# Patient Record
Sex: Male | Born: 1940
Health system: Southern US, Community
[De-identification: ages and names within clinical notes are randomized; demographics above are authoritative.]

## PROBLEM LIST (undated history)

## (undated) DIAGNOSIS — I10 Essential (primary) hypertension: Secondary | ICD-10-CM

## (undated) DIAGNOSIS — B029 Zoster without complications: Secondary | ICD-10-CM

## (undated) DIAGNOSIS — G709 Myoneural disorder, unspecified: Secondary | ICD-10-CM

## (undated) DIAGNOSIS — C61 Malignant neoplasm of prostate: Secondary | ICD-10-CM

## (undated) DIAGNOSIS — D649 Anemia, unspecified: Secondary | ICD-10-CM

## (undated) DIAGNOSIS — E785 Hyperlipidemia, unspecified: Secondary | ICD-10-CM

## (undated) DIAGNOSIS — I499 Cardiac arrhythmia, unspecified: Secondary | ICD-10-CM

## (undated) DIAGNOSIS — E119 Type 2 diabetes mellitus without complications: Secondary | ICD-10-CM

## (undated) DIAGNOSIS — R32 Unspecified urinary incontinence: Secondary | ICD-10-CM

## (undated) DIAGNOSIS — G4733 Obstructive sleep apnea (adult) (pediatric): Secondary | ICD-10-CM

## (undated) DIAGNOSIS — K219 Gastro-esophageal reflux disease without esophagitis: Secondary | ICD-10-CM

## (undated) HISTORY — DX: Anemia, unspecified: D64.9

## (undated) HISTORY — PX: EYE SURGERY: SHX253

## (undated) HISTORY — DX: Zoster without complications: B02.9

## (undated) HISTORY — PX: OTHER SURGICAL HISTORY: SHX169

## (undated) HISTORY — DX: Malignant neoplasm of prostate: C61

## (undated) HISTORY — PX: TONSILLECTOMY: SUR1361

## (undated) HISTORY — DX: Obstructive sleep apnea (adult) (pediatric): G47.33

## (undated) HISTORY — DX: Type 2 diabetes mellitus without complications: E11.9

## (undated) HISTORY — DX: Gastro-esophageal reflux disease without esophagitis: K21.9

## (undated) HISTORY — PX: PROSTATE SURGERY: SHX751

## (undated) HISTORY — DX: Hyperlipidemia, unspecified: E78.5

## (undated) HISTORY — DX: Morbid (severe) obesity due to excess calories: E66.01

## (undated) HISTORY — DX: Essential (primary) hypertension: I10

---

## 1997-12-05 ENCOUNTER — Inpatient Hospital Stay (HOSPITAL_COMMUNITY): Admission: EM | Admit: 1997-12-05 | Discharge: 1997-12-08 | Payer: Self-pay | Admitting: Emergency Medicine

## 1999-02-25 ENCOUNTER — Encounter: Admission: RE | Admit: 1999-02-25 | Discharge: 1999-03-11 | Payer: Self-pay | Admitting: General Surgery

## 1999-11-19 ENCOUNTER — Encounter: Payer: Self-pay | Admitting: Family Medicine

## 1999-11-19 ENCOUNTER — Encounter: Admission: RE | Admit: 1999-11-19 | Discharge: 1999-11-19 | Payer: Self-pay | Admitting: Family Medicine

## 2000-06-30 ENCOUNTER — Emergency Department (HOSPITAL_COMMUNITY): Admission: EM | Admit: 2000-06-30 | Discharge: 2000-06-30 | Payer: Self-pay | Admitting: *Deleted

## 2000-10-05 ENCOUNTER — Ambulatory Visit (HOSPITAL_COMMUNITY): Admission: RE | Admit: 2000-10-05 | Discharge: 2000-10-05 | Payer: Self-pay | Admitting: Gastroenterology

## 2001-08-04 ENCOUNTER — Ambulatory Visit (HOSPITAL_COMMUNITY): Admission: RE | Admit: 2001-08-04 | Discharge: 2001-08-04 | Payer: Self-pay | Admitting: Orthopedic Surgery

## 2001-08-04 ENCOUNTER — Encounter: Payer: Self-pay | Admitting: Orthopaedic Surgery

## 2002-03-23 ENCOUNTER — Ambulatory Visit (HOSPITAL_BASED_OUTPATIENT_CLINIC_OR_DEPARTMENT_OTHER): Admission: RE | Admit: 2002-03-23 | Discharge: 2002-03-23 | Payer: Self-pay | Admitting: Internal Medicine

## 2003-04-14 ENCOUNTER — Emergency Department (HOSPITAL_COMMUNITY): Admission: EM | Admit: 2003-04-14 | Discharge: 2003-04-14 | Payer: Self-pay

## 2003-04-23 ENCOUNTER — Emergency Department (HOSPITAL_COMMUNITY): Admission: EM | Admit: 2003-04-23 | Discharge: 2003-04-23 | Payer: Self-pay | Admitting: Emergency Medicine

## 2004-03-05 ENCOUNTER — Emergency Department (HOSPITAL_COMMUNITY): Admission: EM | Admit: 2004-03-05 | Discharge: 2004-03-05 | Payer: Self-pay | Admitting: Emergency Medicine

## 2006-12-11 ENCOUNTER — Emergency Department (HOSPITAL_COMMUNITY): Admission: EM | Admit: 2006-12-11 | Discharge: 2006-12-12 | Payer: Self-pay | Admitting: Emergency Medicine

## 2007-06-16 ENCOUNTER — Encounter: Admission: RE | Admit: 2007-06-16 | Discharge: 2007-06-16 | Payer: Self-pay | Admitting: Family Medicine

## 2009-02-01 ENCOUNTER — Encounter: Admission: RE | Admit: 2009-02-01 | Discharge: 2009-02-01 | Payer: Self-pay | Admitting: Family Medicine

## 2010-03-27 ENCOUNTER — Encounter: Admission: RE | Admit: 2010-03-27 | Discharge: 2010-03-27 | Payer: Self-pay | Admitting: Orthopedic Surgery

## 2010-07-16 ENCOUNTER — Encounter (HOSPITAL_BASED_OUTPATIENT_CLINIC_OR_DEPARTMENT_OTHER)
Admission: RE | Admit: 2010-07-16 | Discharge: 2010-07-16 | Disposition: A | Discharge status: Home / Self Care | Payer: Medicare Other | Source: Ambulatory Visit | Attending: Orthopedic Surgery | Admitting: Orthopedic Surgery

## 2010-07-16 ENCOUNTER — Other Ambulatory Visit: Payer: Self-pay | Admitting: Orthopedic Surgery

## 2010-07-16 ENCOUNTER — Ambulatory Visit
Admission: RE | Admit: 2010-07-16 | Discharge: 2010-07-16 | Disposition: A | Discharge status: Home / Self Care | Payer: MEDICARE | Source: Ambulatory Visit | Attending: Orthopedic Surgery | Admitting: Orthopedic Surgery

## 2010-07-16 DIAGNOSIS — Z01811 Encounter for preprocedural respiratory examination: Secondary | ICD-10-CM

## 2010-07-16 DIAGNOSIS — Z01812 Encounter for preprocedural laboratory examination: Secondary | ICD-10-CM | POA: Insufficient documentation

## 2010-07-16 DIAGNOSIS — Z01810 Encounter for preprocedural cardiovascular examination: Secondary | ICD-10-CM | POA: Insufficient documentation

## 2010-07-16 LAB — BASIC METABOLIC PANEL
BUN: 12 mg/dL (ref 6–23)
Calcium: 9.1 mg/dL (ref 8.4–10.5)
Creatinine, Ser: 0.87 mg/dL (ref 0.4–1.5)
GFR calc Af Amer: >60 mL/min (ref >60)

## 2010-07-18 ENCOUNTER — Ambulatory Visit (HOSPITAL_BASED_OUTPATIENT_CLINIC_OR_DEPARTMENT_OTHER)
Admission: RE | Admit: 2010-07-18 | Discharge: 2010-07-19 | Disposition: A | Discharge status: Home / Self Care | Payer: Medicare Other | Source: Ambulatory Visit | Attending: Orthopedic Surgery | Admitting: Orthopedic Surgery

## 2010-07-18 DIAGNOSIS — M67919 Unspecified disorder of synovium and tendon, unspecified shoulder: Secondary | ICD-10-CM | POA: Insufficient documentation

## 2010-07-18 DIAGNOSIS — Z5333 Arthroscopic surgical procedure converted to open procedure: Secondary | ICD-10-CM | POA: Insufficient documentation

## 2010-07-18 DIAGNOSIS — M719 Bursopathy, unspecified: Secondary | ICD-10-CM | POA: Insufficient documentation

## 2010-07-18 DIAGNOSIS — M19019 Primary osteoarthritis, unspecified shoulder: Secondary | ICD-10-CM | POA: Insufficient documentation

## 2010-07-18 LAB — GLUCOSE, CAPILLARY: Glucose-Capillary: 118 mg/dL — ABNORMAL HIGH (ref 70–99)

## 2010-07-18 LAB — POCT HEMOGLOBIN-HEMACUE: Hemoglobin: 12.9 g/dL — ABNORMAL LOW (ref 13.0–17.0)

## 2010-07-25 NOTE — Op Note (Signed)
Steve Jensen                ACCOUNT NO.:  [id number]  MEDICAL RECORD NO.:  [id number]           PATIENT TYPE:  LOCATION:                                 FACILITY:  PHYSICIAN:  Katy Fitch. Rosaly Labarbera, M.D.      DATE OF BIRTH:  DATE OF PROCEDURE:  07/18/2010 DATE OF DISCHARGE:                              OPERATIVE REPORT   PREOPERATIVE DIAGNOSIS:  Large degenerative rotator cuff tear right shoulder documented by preoperative MRI with significant labral pathology and significant biceps tendinopathy, also closure of subacromial outlet due to severe AC arthropathy with inferior projecting osteophytes, distal clavicle, and medial acromion.  POSTOPERATIVE DIAGNOSIS:  Large degenerative rotator cuff tear right shoulder documented by preoperative MRI with significant labral pathology and significant biceps tendinopathy, also closure of subacromial outlet due to severe AC arthropathy with inferior projecting osteophytes, distal clavicle, and medial acromion with identification of more than 50% degenerative biceps tear at watershed zone proximal to bicipital groove, also retracted tear of supraspinous infraspinatus retracted back more than 2 cm.  OPERATION: 1. Examination of right shoulder under anesthesia demonstrating     glenohumeral stability and no sign of adhesive capsulitis. 2. Arthroscopic evaluation of glenohumeral joint followed by     arthroscopic debridement of labrum, synovitis, and biceps     tendinopathy. 3. Arthroscopic subacromial decompression with bursectomy,     coracoacromial ligament partial relaxation, and anterior, medial,     and lateral acromioplasty. 4. Arthroscopic distal clavicle resection. 5. Arthroscopic biceps tenodesis. 6. Arthroscopic repair of supraspinatus and infraspinatus ultimately     aborted due to a delamination of a fiber tape suture in the     posterior aspect of the infraspinatus.  We had performed a double row repair and set our sutures  anteriorly and unfortunately noted a partial tear of one of the fiber tapes, which precluded sliding a swivel lock, therefore we immediately converted to an open repair and confirmed a satisfactory inset of the rotator cuff tendons with the previous arthroscopic technique.  OPERATING SURGEON:  Katy Fitch. La Shehan, MD  ASSISTANT:  Marveen Reeks Dasnoit, PA-C  ANESTHESIA:  General endotracheal supplemented by a right Carbocaine interscalene block.  SUPERVISING ANESTHESIOLOGIST:  Janetta Hora. Gelene Mink, MD  INDICATIONS:  Steve Jensen is a 70 year old gentleman referred by Dr. Cam Hai and Dr. Blair Heys for evaluation and management of a painful right shoulder.  Steve Jensen has multiple cold comorbidities including type 2 diabetes, hypertension, hyperlipidemia, esophageal reflux, prostate cancer treated in 1995 with PSA less than 0.1, history of shingles, and sleep apnea.  Per his wife and Steve Jensen, he will not use CPAP despite his diagnosis of sleep apnea.  He was interviewed by Dr. Gelene Mink of Anesthesia and was seen for preoperative clearance evaluation by Dr. Manus Gunning on July 15, 2010. Dr. Manus Gunning made a number of pertinent observations during the preoperative assessment including a weight of 300 pounds, BMI of 43.04, and a blood pressure setting of 150/74.  Dr. Manus Gunning felt that he was prepared to proceed with surgery; however, we will need to monitor his glucose and continue all his medication in the  perioperative period.  Steve Jensen is not on anticoagulation medicine, but will be treated with perioperative calf compression and immediate mobilization to try to minimize his risk of deep vein thrombosis and possible pulmonary embolus.  Preoperatively in the holding area, I had a detailed discussion with Mr. and [name] in which I stated that it was essential that he get up and move immediately following surgery so that we do not have a difficulty with  postoperative deep vein thrombosis and the risk of pulmonary embolus, we did explain that we would use passive compression devices.  Questions were invited and answered in detail.  Dr. Gelene Mink interviewed him preoperatively and placed a Carbocaine interscalene block without complication leading to excellent anesthesia of the right forequarter.  PROCEDURE:  Steve Jensen was brought to room one of the [facility] and placed in a supine position on the operating table.  Following induction of general anesthesia by endotracheal technique under Dr. Thornton Dales direct supervision, he was carefully positioned in a beach-chair position with a torso and head holder designed for arthroscopy.  All bony prominences were padded preoperatively and passive compression devices were placed prior to anesthesia induction. After clearance from the Anesthesia Service, we proceeded to position him for right shoulder arthroscopy and a torso and head holder designed for arthroscopy in a modified beach chair position.  The entire right upper extremity forequarter prepped with DuraPrep and draped with impervious arthroscopy drapes sealed with Steri-drape.  Ancef 1 g was administered as an IV prophylactic antibiotic.  The procedure commenced with examination of shoulder under anesthesia. He had combined elevation of 165, external rotation 90, no sign of an internal rotation contracture, no signs of adhesive capsulitis.  The scope was then reduced through a standard posterior viewing portal with blunt technique.  The glenohumeral joint was notable for excessive synovitis anteriorly and around the rotator cuff.  There was a large retracted rotator cuff tear that extended from the biceps interval posteriorly across the entire supraspinatus and infraspinatus.  This was retracted to the mid hyaline articular cartilage joint surface.  The tear was documented with digital camera.  The biceps had a more  than 50% degenerative tear and the superior and anterior labrum were quite degenerative.  A 5.5-mm suction shaver was brought in anteriorly and used to perform a thorough debridement of the synovitis, biceps tendon, and deep surface of the rotator cuff.  The hyaline articular cartilage surfaces of the glenoid humeral head were well preserved.  Attention was then directed to placing the scope in the subacromial space.  A suction shaver was introduced through posterolateral portal and extensive bursectomy accomplished.  The cuff tear was studied and seemed amenable for possible arthroscopic repair.  Given Steve Jensen's BMI of 43 there were great advantages of performing arthroscopic repair.  We proceeded to decorticate the greater tuberosity lowering his profile approximately 3 mm of the suction bur.  The coracoacromial ligament was partially relaxed and the acromion was leveled to a type 1 morphology with removal of bone spurs medially at the [facility] joint anteriorly and laterally.  The distal centimeter clavicle was removed arthroscopically. Hemostasis achieved with bipolar cautery.  We then placed posterolateral, anterolateral, and anterior portals in an attempt to begin arthroscopic repair.  We placed 2 medial swivel locks at the articular margin, anticipating a crisscross compression two-row repair.  The fiber tapes were passed with a scorpion suture passer in a standard manner.  We had 2 technical challenges, one being the degenerative biceps,  the second being a tendency towards dog ears with advancement of the cuff.  Therefore, we performed an arthroscopic tenodesis of the biceps using the central suture of the anterior corkscrew with a double pass of the scorpion.  This was tied arthroscopically with a knot pusher.  We then used a suction shaver to debride the intra-articular portion of the biceps and documented the resection with a digital camera.  We proceeded along in the  pathway of an arthroscopic repair completing placement of the medial passes of the fiber tape followed by comparing the relative anterior limbs of the fiber tape and posterior limbs of fiber tape for a crisscross type inset repair.  The anterior set was placed initially to be certain that the biceps was properly covered followed by preparation to set the posterior pair of fiber tapes.  At this point, we noted we could not slide a swivel lock through the cannula due to fray of the fiber tape.  This led to a technical problem that would preclude completing the arthroscopic repair.  Therefore, I performed an anterior middle third deltoid muscle splitting incision through the anterolateral portal and retrieved the sutures.  We confirmed that the dog ears were slightly problematic and proceeded to resect the dog ears under direct vision.  The repair was completed with a posterior swivel lock with excellent purchase and inset.  The central suture on the swivel lock placed laterally was then used to finish the posterior dog ear and the anterior dog ear was repaired with a free mattress suture of #2 FiberWire that was placed with a third lateral swivel lock.  A very satisfactory low-profile repair was achieved.  The undersurface of the acromion was palpated and found to be satisfactory.  Hemostasis of the bursa was obtained with the bipolar and a Bovie cautery.  After irrigation, the deltoid was repaired with simple suture of 0- Vicryl followed by repair of the skin with subcutaneous 2-0 and 0-Vicryl and intradermal 3-0 Prolene.  The portals were repaired with intradermal 3-0 Prolene.  There were no apparent complications other than an additional 30 minutes of surgery to complete the repair due to the technical problem with the fiber tape.  Steve Jensen was awakened from general anesthesia, placed in a sling, and transferred to the recovery room with stable signs.  Due to his multiple  comorbidities as stated previously, we will mobilize him immediately.  We will monitor his O2 sat overnight and his wife will stay due to his choice not to use a CPAP machine.  A postoperative conference was held with his wife explaining that our operative time was a bit more than expected 2 hours due to our technical issue and the fact that we are operating on a very large shoulder with the attendant technical difficulties of operating on morbidly obese individuals.  Questions were invited and answered in detail.     Katy Fitch Tyleek Smick, M.D.     RVS/MEDQ  D:  07/18/2010  T:  07/19/2010  Job:  [id number]  cc:   Bryan Lemma. Manus Gunning, M.D. Lupita Raider, M.D.  Electronically Signed by Josephine Igo M.D. on 07/25/2010 10:24:18 AM

## 2013-02-07 DIAGNOSIS — M25561 Pain in right knee: Secondary | ICD-10-CM | POA: Insufficient documentation

## 2013-03-03 ENCOUNTER — Encounter: Payer: Self-pay | Admitting: Interventional Cardiology

## 2013-03-03 DIAGNOSIS — C61 Malignant neoplasm of prostate: Secondary | ICD-10-CM | POA: Insufficient documentation

## 2013-03-03 DIAGNOSIS — R809 Proteinuria, unspecified: Secondary | ICD-10-CM | POA: Insufficient documentation

## 2013-03-03 DIAGNOSIS — J309 Allergic rhinitis, unspecified: Secondary | ICD-10-CM | POA: Insufficient documentation

## 2013-03-03 DIAGNOSIS — I10 Essential (primary) hypertension: Secondary | ICD-10-CM | POA: Insufficient documentation

## 2013-03-03 DIAGNOSIS — E1149 Type 2 diabetes mellitus with other diabetic neurological complication: Secondary | ICD-10-CM

## 2013-03-03 DIAGNOSIS — E1142 Type 2 diabetes mellitus with diabetic polyneuropathy: Secondary | ICD-10-CM | POA: Insufficient documentation

## 2013-03-03 DIAGNOSIS — M1990 Unspecified osteoarthritis, unspecified site: Secondary | ICD-10-CM | POA: Insufficient documentation

## 2013-03-03 DIAGNOSIS — G4733 Obstructive sleep apnea (adult) (pediatric): Secondary | ICD-10-CM | POA: Insufficient documentation

## 2013-03-03 DIAGNOSIS — E119 Type 2 diabetes mellitus without complications: Secondary | ICD-10-CM | POA: Insufficient documentation

## 2013-03-03 DIAGNOSIS — E782 Mixed hyperlipidemia: Secondary | ICD-10-CM | POA: Insufficient documentation

## 2013-03-08 ENCOUNTER — Ambulatory Visit (INDEPENDENT_AMBULATORY_CARE_PROVIDER_SITE_OTHER): Payer: Medicare Other | Admitting: Interventional Cardiology

## 2013-03-08 ENCOUNTER — Encounter: Payer: Self-pay | Admitting: Interventional Cardiology

## 2013-03-08 VITALS — BP 150/82 | HR 81 | Ht 73.0 in | Wt 300.0 lb

## 2013-03-08 DIAGNOSIS — G4733 Obstructive sleep apnea (adult) (pediatric): Secondary | ICD-10-CM

## 2013-03-08 DIAGNOSIS — R0602 Shortness of breath: Secondary | ICD-10-CM

## 2013-03-08 DIAGNOSIS — I10 Essential (primary) hypertension: Secondary | ICD-10-CM

## 2013-03-08 DIAGNOSIS — E782 Mixed hyperlipidemia: Secondary | ICD-10-CM

## 2013-03-08 NOTE — Progress Notes (Signed)
Patient ID: Steve Jensen, male   DOB: 22-Mar-1941, 72 y.o.   MRN: [id number]    [address] [place], [place]  [address] Phone: [phone] Fax:  [phone]  Date:  03/08/2013   ID:  DAEMIAN GAHM, DOB 10-27-1940, MRN [id number]  PCP:  Lupita Raider, MD      History of Present Illness: Steve Jensen is a 72 y.o. male  who has risk factors for coronary artery disease. He has had swelling and SHOB. His SHOB is better. His shoulder is better. He is using CPAP. He feels more rested and less fatigued. Works in the yard for exercise. No regular walking.  Limited by right knee pain. Hypertension:  c/o Leg edema more at the end of the day. Unchanged at the ankles Denies : Chest pain.  Dizziness.  Orthopnea.  Paroxysmal nocturnal dyspnea.  Palpitations.  Syncope.     Wt Readings from Last 3 Encounters:  03/08/13 300 lb (136.079 kg)     Past Medical History  Diagnosis Date  . Hypertension   . Hyperlipidemia   . Anemia   . Morbid obesity   . Diabetes mellitus type II, controlled     with neuropathy  . Prostate cancer   . GERD (gastroesophageal reflux disease)   . Shingles     on face Nov 2010  . OSA (obstructive sleep apnea)     Current Outpatient Prescriptions  Medication Sig Dispense Refill  . amLODipine (NORVASC) 10 MG tablet 10 mg daily.       Marland Kitchen aspirin 81 MG tablet Take 81 mg by mouth daily.      . fish oil-omega-3 fatty acids 1000 MG capsule Take 1,200 mg by mouth 2 (two) times daily.      . furosemide (LASIX) 40 MG tablet 40 mg 2 (two) times daily.       . metFORMIN (GLUCOPHAGE) 1000 MG tablet 1,000 mg 2 (two) times daily with a meal.       . omeprazole (PRILOSEC) 20 MG capsule 20 mg.       . ramipril (ALTACE) 10 MG capsule 10 mg 2 (two) times daily.       Marland Kitchen ZETIA 10 MG tablet Take 10 mg by mouth daily.        No current facility-administered medications for this visit.    Allergies:    Allergies  Allergen Reactions  . Lipitor  [Atorvastatin]     Muscle aches  . Simvastatin     Muscle aches  . Zithromax [Azithromycin]     GI-SIDE EFFECTS    Social History:  The patient  reports that he has quit smoking. He does not have any smokeless tobacco history on file. He reports that he does not drink alcohol or use illicit drugs.   Family History:  The patient's family history includes CVA in his brother; Diabetes in his brother, brother, and brother; Heart disease in his brother; Hypertension in his brother, brother, and brother; Prostate cancer in his brother.   ROS:  Please see the history of present illness.  No nausea, vomiting.  No fevers, chills.  No focal weakness.  No dysuria. Feels rested after sleep when he uses his CPAP.    All other systems reviewed and negative.   PHYSICAL EXAM: VS:  BP 150/82  Pulse 81  Ht 6\' 1"  (1.854 m)  Wt 300 lb (136.079 kg)  BMI 39.59 kg/m2 Well nourished, well developed, in no acute distress HEENT: normal Neck: no  JVD, no carotid bruits Cardiac:  normal S1, S2; RRR;  Lungs:  clear to auscultation bilaterally, no wheezing, rhonchi or rales Abd: soft, nontender, no hepatomegaly Ext: tr pedal edema Skin: warm and dry Neuro:   no focal abnormalities noted  EKG:  Normal     ASSESSMENT AND PLAN:  Obstructive sleep apnea  Tolerating CPAP. CONtinue to use this for sleep along with weight loss.  2. Dyspnea  Continue Lasix Tablet, 40 MG, 1 tablet, Orally, twice a day Improving. Likely related to diastolic dysfunction. He had a negative nuclear stress test in May of 2012. At that time, ejection fraction was 65%.  3. Obesity  Continue to try to lose weight. Exercise limited by knee pain.  Wants to avoid surgery on the knee.  He discussed an ad for a technique that could help lose a pound a day. I recommend a pound a week weight loss through diet control and exercise.  He has cut back on portion sizes.   4. Hypertension, essential  Continue Ramipril Capsule, 10 MG, 1 capsule,  Orally, Twice a day for BP Continue Amlodipine Besylate Tablet, 10 MG, 1 tablet, Orally, Once a day for BP Well controlled when he goes to donate blood at the [facility]. 130/78 , 130/70, 138/70   Signed, Fredric Mare, MD, [facility] 03/08/2013 2:51 PM

## 2013-03-08 NOTE — Patient Instructions (Signed)
Your physician wants you to follow-up in: 1 year with Dr. Varanasi. You will receive a reminder letter in the mail two months in advance. If you don't receive a letter, please call our office to schedule the follow-up appointment.  Your physician recommends that you continue on your current medications as directed. Please refer to the Current Medication list given to you today.  

## 2013-04-05 ENCOUNTER — Other Ambulatory Visit: Payer: Self-pay | Admitting: Interventional Cardiology

## 2013-06-24 ENCOUNTER — Other Ambulatory Visit: Payer: Self-pay | Admitting: Orthopedic Surgery

## 2013-06-24 DIAGNOSIS — M25561 Pain in right knee: Secondary | ICD-10-CM

## 2013-06-25 ENCOUNTER — Ambulatory Visit
Admission: RE | Admit: 2013-06-25 | Discharge: 2013-06-25 | Disposition: A | Discharge status: Home / Self Care | Payer: Medicare Other | Source: Ambulatory Visit | Attending: Orthopedic Surgery | Admitting: Orthopedic Surgery

## 2013-06-25 DIAGNOSIS — M25561 Pain in right knee: Secondary | ICD-10-CM

## 2013-06-27 ENCOUNTER — Encounter: Payer: Self-pay | Admitting: Cardiology

## 2013-06-27 ENCOUNTER — Ambulatory Visit (INDEPENDENT_AMBULATORY_CARE_PROVIDER_SITE_OTHER): Payer: Medicare Other | Admitting: Cardiology

## 2013-06-27 ENCOUNTER — Encounter (INDEPENDENT_AMBULATORY_CARE_PROVIDER_SITE_OTHER): Payer: Self-pay

## 2013-06-27 VITALS — BP 146/76 | HR 107 | Ht 72.0 in | Wt 302.0 lb

## 2013-06-27 DIAGNOSIS — G4733 Obstructive sleep apnea (adult) (pediatric): Secondary | ICD-10-CM

## 2013-06-27 DIAGNOSIS — I10 Essential (primary) hypertension: Secondary | ICD-10-CM

## 2013-06-27 NOTE — Patient Instructions (Signed)
Your physician recommends that you continue on your current medications as directed. Please refer to the Current Medication list given to you today.  Your physician wants you to follow-up in: 6 Months with Dr Turner You will receive a reminder letter in the mail two months in advance. If you don't receive a letter, please call our office to schedule the follow-up appointment.  

## 2013-06-27 NOTE — Progress Notes (Signed)
[address], [place], [place]  [address] Phone: [phone] Fax:  [phone]  Date:  06/27/2013   ID:  Steve Jensen, DOB 1940/05/31, MRN [id number]  PCP:  [name], MD  Cardiologist:  Dr. Casandra Doffing  Sleep Medicine:  Fransico Him, MD   History of Present Illness: Steve Jensen is a 73 y.o. male with a history of OSA on BiPAP, morbid obesity and HTN who presents today for followup.  He tolerates his BiPAP therapy well.  He tolerates the mask and feels the pressure is adequate. He does not feel rested when he gets up due to getting up so much at night due to his diuretic and takes a nap occasionally during the day.  Last week his machine started having problems with the humidifier.  He says that it is now running out of water and the temperature stays up and almost burned his nose.   Wt Readings from Last 3 Encounters:  06/27/13 302 lb (136.986 kg)  03/08/13 300 lb (136.079 kg)     Past Medical History  Diagnosis Date  . Hypertension   . Hyperlipidemia   . Anemia   . Morbid obesity   . Diabetes mellitus type II, controlled     with neuropathy  . Prostate cancer   . GERD (gastroesophageal reflux disease)   . Shingles     on face Nov 2010  . OSA (obstructive sleep apnea)     Current Outpatient Prescriptions  Medication Sig Dispense Refill  . amLODipine (NORVASC) 10 MG tablet 10 mg daily.       Marland Kitchen aspirin 81 MG tablet Take 81 mg by mouth daily.      . fish oil-omega-3 fatty acids 1000 MG capsule Take 1,200 mg by mouth 2 (two) times daily.      . furosemide (LASIX) 40 MG tablet TAKE 1 TABLET BY MOUTH TWICE DAILY  180 tablet  3  . metFORMIN (GLUCOPHAGE) 1000 MG tablet 1,000 mg 2 (two) times daily with a meal.       . omeprazole (PRILOSEC) 20 MG capsule 20 mg.       . ramipril (ALTACE) 10 MG capsule 10 mg 2 (two) times daily.       Marland Kitchen ZETIA 10 MG tablet Take 10 mg by mouth daily.        No current facility-administered medications for this visit.     Allergies:    Allergies  Allergen Reactions  . Lipitor [Atorvastatin]     Muscle aches  . Simvastatin     Muscle aches  . Zithromax [Azithromycin]     GI-SIDE EFFECTS    Social History:  The patient  reports that he has quit smoking. He does not have any smokeless tobacco history on file. He reports that he does not drink alcohol or use illicit drugs.   Family History:  The patient's family history includes CVA in his brother; Diabetes in his brother, brother, and brother; Heart disease in his brother; Hypertension in his brother, brother, and brother; Prostate cancer in his brother.   ROS:  Please see the history of present illness.      All other systems reviewed and negative.   PHYSICAL EXAM: VS:  BP 146/76  Pulse 107  Ht 6' (1.829 m)  Wt 302 lb (136.986 kg)  BMI 40.95 kg/m2  SpO2 98% Well nourished, well developed, in no acute distress HEENT: normal Neck: no JVD Cardiac:  normal S1, S2; RRR; no murmur Lungs:  clear to auscultation bilaterally, no wheezing, rhonchi or rales Abd: soft, nontender, no hepatomegaly Ext: no edema Skin: warm and dry Neuro:  CNs 2-12 intact, no focal abnormalities noted       ASSESSMENT AND PLAN:  1.  OSA on BiPAP ASV - his download today showed an AHI of 0.4/hr but only 40% compliance in using more than 4 hours nightly.  - I recommended that he try to increase his usage to at least 5 hours nightly.   - I have asked him to take his machine over to [facility] to have it looked at for the humidifier issues 2.  HTN - borderline control  - continue amlodipine/ramipril 3.  Morbid obesity  Followup with me in 6 months  Signed, Fransico Him, MD 06/27/2013 3:30 PM

## 2013-07-11 DIAGNOSIS — Z9889 Other specified postprocedural states: Secondary | ICD-10-CM | POA: Insufficient documentation

## 2013-08-10 ENCOUNTER — Ambulatory Visit
Admission: RE | Admit: 2013-08-10 | Discharge: 2013-08-10 | Disposition: A | Discharge status: Home / Self Care | Payer: Medicare Other | Source: Ambulatory Visit | Attending: Physician Assistant | Admitting: Physician Assistant

## 2013-08-10 ENCOUNTER — Other Ambulatory Visit: Payer: Self-pay | Admitting: Physician Assistant

## 2013-08-10 DIAGNOSIS — R059 Cough, unspecified: Secondary | ICD-10-CM

## 2013-08-10 DIAGNOSIS — R0602 Shortness of breath: Secondary | ICD-10-CM

## 2013-08-10 DIAGNOSIS — R05 Cough: Secondary | ICD-10-CM

## 2013-08-11 ENCOUNTER — Ambulatory Visit (INDEPENDENT_AMBULATORY_CARE_PROVIDER_SITE_OTHER): Payer: Medicare Other | Admitting: Interventional Cardiology

## 2013-08-11 ENCOUNTER — Encounter: Payer: Self-pay | Admitting: Interventional Cardiology

## 2013-08-11 VITALS — BP 118/60 | HR 120 | Ht 72.0 in | Wt 294.0 lb

## 2013-08-11 DIAGNOSIS — R0602 Shortness of breath: Secondary | ICD-10-CM

## 2013-08-11 DIAGNOSIS — I10 Essential (primary) hypertension: Secondary | ICD-10-CM

## 2013-08-11 DIAGNOSIS — I4891 Unspecified atrial fibrillation: Secondary | ICD-10-CM

## 2013-08-11 DIAGNOSIS — I480 Paroxysmal atrial fibrillation: Secondary | ICD-10-CM | POA: Insufficient documentation

## 2013-08-11 LAB — BASIC METABOLIC PANEL
BUN: 13 mg/dL (ref 6–23)
CALCIUM: 8.6 mg/dL (ref 8.4–10.5)
CHLORIDE: 102 meq/L (ref 96–112)
CO2: 25 meq/L (ref 19–32)
Creatinine, Ser: 1.1 mg/dL (ref 0.4–1.5)
GFR: 70.79 mL/min (ref >60.00)
Glucose, Bld: 202 mg/dL — ABNORMAL HIGH (ref 70–99)
Potassium: 3.8 mEq/L (ref 3.5–5.1)
SODIUM: 139 meq/L (ref 135–145)

## 2013-08-11 LAB — TSH: TSH: 2.83 u[IU]/mL (ref 0.35–5.50)

## 2013-08-11 MED ORDER — DILTIAZEM HCL ER COATED BEADS 240 MG PO CP24: 240.0000 mg | ORAL_CAPSULE | Freq: Every day | ORAL | Status: DC

## 2013-08-11 MED ORDER — APIXABAN 5 MG PO TABS: 5.0000 mg | ORAL_TABLET | Freq: Two times a day (BID) | ORAL | Status: DC

## 2013-08-11 NOTE — Patient Instructions (Signed)
Your physician has recommended you make the following change in your medication:   1. Stop Aspirin.  2. Stop Amlodipine.  3. Start Eliquis 5 mg 1 tab twice a day.  4. Start Cardizem CD 240 mg 1 tablet daily.  Your physician recommends that you return for lab work today for TSH and BMET.  Your physician recommends that you schedule a follow-up appointment in: 1 month with Dr. Irish Lack. (ok to double book)  Your physician has requested that you have an echocardiogram. Echocardiography is a painless test that uses sound waves to create images of your heart. It provides your doctor with information about the size and shape of your heart and how well your heart's chambers and valves are working. This procedure takes approximately one hour. There are no restrictions for this procedure.  Your physician recommends that you return for lab work in: 6 months on 02/13/14 for Bmet and CBC.

## 2013-08-11 NOTE — Progress Notes (Signed)
Patient ID: Steve Jensen, male   DOB: 07/25/1940, 73 y.o.   MRN: [id number]    [place], [address] [place], [place]  [address] Phone: [phone] Fax:  [phone]  Date:  08/11/2013   ID:  Steve Jensen, DOB 08/11/1940, MRN [id number]  PCP:  [name], MD      History of Present Illness: Steve Jensen is a 73 y.o. male who has had swelling and SHOB. He was diagnosed with bronchitis and pneumonia a week ago.  Marland Kitchen He is using CPAP until the past week. He feels more fatigued. Works in the yard for exercise. No regular walking.  No swelling.  He had knee surgery in 2/15 and was not told he was in AFib at that time.  Hypertension:  c/o Leg edema more at the end of the day.  Denies : Chest pain.  Dizziness.  Orthopnea.  Paroxysmal nocturnal dyspnea.  Palpitations.  Syncope.     Wt Readings from Last 3 Encounters:  08/11/13 294 lb (133.358 kg)  06/27/13 302 lb (136.986 kg)  03/08/13 300 lb (136.079 kg)     Past Medical History  Diagnosis Date  . Hypertension   . Hyperlipidemia   . Anemia   . Morbid obesity   . Diabetes mellitus type II, controlled     with neuropathy  . Prostate cancer   . GERD (gastroesophageal reflux disease)   . Shingles     on face Nov 2010  . OSA (obstructive sleep apnea)     Current Outpatient Prescriptions  Medication Sig Dispense Refill  . amLODipine (NORVASC) 10 MG tablet 10 mg daily.       Marland Kitchen aspirin 81 MG tablet Take 81 mg by mouth daily.      . fish oil-omega-3 fatty acids 1000 MG capsule Take 1,200 mg by mouth 2 (two) times daily.      . furosemide (LASIX) 40 MG tablet TAKE 1 TABLET BY MOUTH TWICE DAILY  180 tablet  3  . HYDROcodone-acetaminophen (NORCO/VICODIN) 5-325 MG per tablet       . ibuprofen (ADVIL,MOTRIN) 800 MG tablet       . metFORMIN (GLUCOPHAGE) 1000 MG tablet 1,000 mg 2 (two) times daily with a meal.       . metoprolol succinate (TOPROL-XL) 50 MG 24 hr tablet       . omeprazole (PRILOSEC) 20 MG capsule 20 mg.        . ramipril (ALTACE) 10 MG capsule 10 mg 2 (two) times daily.       Marland Kitchen ZETIA 10 MG tablet Take 10 mg by mouth daily.        No current facility-administered medications for this visit.    Allergies:    Allergies  Allergen Reactions  . Lipitor [Atorvastatin]     Muscle aches  . Simvastatin     Muscle aches  . Zithromax [Azithromycin]     GI-SIDE EFFECTS    Social History:  The patient  reports that he has quit smoking. He does not have any smokeless tobacco history on file. He reports that he does not drink alcohol or use illicit drugs.   Family History:  The patient's family history includes CVA in his brother; Diabetes in his brother, brother, and brother; Heart disease in his brother; Hypertension in his brother, brother, and brother; Prostate cancer in his brother.   ROS:  Please see the history of present illness.  No nausea, vomiting.  No fevers, chills.  No  focal weakness.  No dysuria.    All other systems reviewed and negative.   PHYSICAL EXAM: VS:  BP 118/60  Pulse 60  Ht 6' (1.829 m)  Wt 294 lb (133.358 kg)  BMI 39.86 kg/m2 Well nourished, well developed, in no acute distress HEENT: normal Neck: no JVD, no carotid bruits Cardiac:  normal S1, S2; irregulary Lungs:  clear to auscultation bilaterally, no wheezing, rhonchi or rales Abd: soft, nontender, no hepatomegaly Ext: no edema Skin: warm and dry Neuro:   no focal abnormalities noted  EKG:  AFib with RVR     ASSESSMENT AND PLAN:  1. AFib: New onset.  ECHO in 4/12: Normal LV function at that time.  Mild AI, mildly dilated aortic root.  Repeat echo.  Discussed anticoagulation.  He is ok to start a NOAC.  Switch amlodipine to Cardizem 240 mg daily.  Continue metoprolol. Check electrolytes. We'll plan to start Eliquis 5 mg twice a day.  Chads score is 3.  Check TSH. May have to decrease fish oil with Eliquis.  Stop aspirin.  Consider cardioversion in the future. 2. SHOB: Recent bronchitis. He has completed a  course of antibiotics. I wonder if part of this is coming from diastolic dysfunction due to the new onset of atrial fibrillation.  Continue twice a day furosemide. Unclear when his atrial fibrillation actually started. It is likely after February, since he had knee surgery at that time and there is no mention made. 3. Obesity: Stressed the importance of weight loss to help prevent atrial fibrillation. 4. OSA: Wearing CPAP as tolerated.  Signed, Mina Marble, MD, [facility] 08/11/2013 8:31 AM

## 2013-08-15 ENCOUNTER — Telehealth: Payer: Self-pay | Admitting: *Deleted

## 2013-08-15 ENCOUNTER — Telehealth: Payer: Self-pay | Admitting: Interventional Cardiology

## 2013-08-15 NOTE — Telephone Encounter (Signed)
Returned pt's call.

## 2013-08-15 NOTE — Telephone Encounter (Signed)
PA to [employer] [facility] for patients eliquis

## 2013-08-15 NOTE — Telephone Encounter (Signed)
New message    Patient calling stating someone called him today told him to call back.

## 2013-08-15 NOTE — Telephone Encounter (Signed)
Follow up ° ° ° ° °Returning Steve Jensen's call °

## 2013-08-18 ENCOUNTER — Ambulatory Visit (HOSPITAL_COMMUNITY): Payer: Medicare Other | Attending: Interventional Cardiology | Admitting: Radiology

## 2013-08-18 DIAGNOSIS — I4891 Unspecified atrial fibrillation: Secondary | ICD-10-CM | POA: Insufficient documentation

## 2013-08-18 NOTE — Progress Notes (Signed)
Echocardiogram performed.  

## 2013-08-22 ENCOUNTER — Telehealth: Payer: Self-pay | Admitting: Interventional Cardiology

## 2013-08-22 NOTE — Telephone Encounter (Signed)
Returned pt's call.

## 2013-08-22 NOTE — Telephone Encounter (Signed)
PA to [employer] [facility] for eliquis approved through 08/15/2014, [place] # [id number]

## 2013-08-22 NOTE — Telephone Encounter (Signed)
New message     Patient calling stated someone called him last week regarding his echo results

## 2013-08-31 ENCOUNTER — Ambulatory Visit (INDEPENDENT_AMBULATORY_CARE_PROVIDER_SITE_OTHER): Payer: Medicare Other | Admitting: Interventional Cardiology

## 2013-08-31 ENCOUNTER — Encounter: Payer: Self-pay | Admitting: Cardiology

## 2013-08-31 ENCOUNTER — Encounter: Payer: Self-pay | Admitting: Interventional Cardiology

## 2013-08-31 VITALS — BP 126/78 | HR 78 | Ht 71.0 in | Wt 289.8 lb

## 2013-08-31 DIAGNOSIS — I4891 Unspecified atrial fibrillation: Secondary | ICD-10-CM

## 2013-08-31 DIAGNOSIS — I10 Essential (primary) hypertension: Secondary | ICD-10-CM

## 2013-08-31 DIAGNOSIS — G4733 Obstructive sleep apnea (adult) (pediatric): Secondary | ICD-10-CM

## 2013-08-31 LAB — PROTIME-INR
INR: 1.3 ratio — AB (ref 0.8–1.0)
Prothrombin Time: 13.7 s — ABNORMAL HIGH (ref 10.2–12.4)

## 2013-08-31 LAB — BASIC METABOLIC PANEL
BUN: 16 mg/dL (ref 6–23)
CALCIUM: 9 mg/dL (ref 8.4–10.5)
CO2: 28 mEq/L (ref 19–32)
Chloride: 101 mEq/L (ref 96–112)
Creatinine, Ser: 1 mg/dL (ref 0.4–1.5)
GFR: 75.56 mL/min (ref >60.00)
Glucose, Bld: 146 mg/dL — ABNORMAL HIGH (ref 70–99)
POTASSIUM: 3.3 meq/L — AB (ref 3.5–5.1)
Sodium: 140 mEq/L (ref 135–145)

## 2013-08-31 LAB — CBC WITH DIFFERENTIAL/PLATELET
BASOS PCT: 0.5 % (ref 0.0–3.0)
Basophils Absolute: 0.1 10*3/uL (ref 0.0–0.1)
EOS PCT: 2 % (ref 0.0–5.0)
Eosinophils Absolute: 0.2 10*3/uL (ref 0.0–0.7)
HCT: 39.5 % (ref 39.0–52.0)
Hemoglobin: 12.8 g/dL — ABNORMAL LOW (ref 13.0–17.0)
LYMPHS PCT: 31 % (ref 12.0–46.0)
Lymphs Abs: 3 10*3/uL (ref 0.7–4.0)
MCHC: 32.4 g/dL (ref 30.0–36.0)
MCV: 74.6 fl — AB (ref 78.0–100.0)
MONO ABS: 1 10*3/uL (ref 0.1–1.0)
Monocytes Relative: 10 % (ref 3.0–12.0)
NEUTROS PCT: 56.5 % (ref 43.0–77.0)
Neutro Abs: 5.5 10*3/uL (ref 1.4–7.7)
Platelets: 309 10*3/uL (ref 150.0–400.0)
RBC: 5.3 Mil/uL (ref 4.22–5.81)
RDW: 17.7 % — ABNORMAL HIGH (ref 11.5–14.6)
WBC: 9.7 10*3/uL (ref 4.5–10.5)

## 2013-08-31 NOTE — Progress Notes (Signed)
Patient ID: Steve Jensen, male   DOB: 1940/08/23, 73 y.o.   MRN: [id number] Patient ID: Steve Jensen, male   DOB: 1941/03/13, 73 y.o.   MRN: [id number]    [place], [place] [place], [place]  [address] Phone: [phone] Fax:  [phone]  Date:  08/31/2013   ID:  Steve Jensen, DOB Jan 12, 1941, MRN [id number]  PCP:  [name], MD      History of Present Illness: Steve Jensen is a 73 y.o. male who has had swelling and SHOB. He was diagnosed with bronchitis and pneumonia a week ago.  Marland Kitchen He is using CPAP until the past week. He feels more fatigued. Works in the yard for exercise. No regular walking.  No swelling.  He had knee surgery in 2/15 and was not told he was in AFib at that time.  Hypertension:  c/o Leg edema more at the end of the day- same as usual.  Denies : Chest pain.  Dizziness.  Orthopnea.  Paroxysmal nocturnal dyspnea.  Palpitations.  Syncope.   One espisode of SHOB in the morning at his usual time, where it felt bettter sitting up.  No further episode.  No excessive salt intake.   Wt Readings from Last 3 Encounters:  08/31/13 289 lb 12.8 oz (131.452 kg)  08/11/13 294 lb (133.358 kg)  06/27/13 302 lb (136.986 kg)     Past Medical History  Diagnosis Date  . Hypertension   . Hyperlipidemia   . Anemia   . Morbid obesity   . Diabetes mellitus type II, controlled     with neuropathy  . Prostate cancer   . GERD (gastroesophageal reflux disease)   . Shingles     on face Nov 2010  . OSA (obstructive sleep apnea)     Current Outpatient Prescriptions  Medication Sig Dispense Refill  . amLODipine (NORVASC) 10 MG tablet       . apixaban (ELIQUIS) 5 MG TABS tablet Take 1 tablet (5 mg total) by mouth 2 (two) times daily.  60 tablet  6  . diltiazem (CARTIA XT) 240 MG 24 hr capsule Take 1 capsule (240 mg total) by mouth daily.  30 capsule  6  . fish oil-omega-3 fatty acids 1000 MG capsule Take 1,200 mg by mouth 2 (two) times daily.      . furosemide  (LASIX) 40 MG tablet TAKE 1 TABLET BY MOUTH TWICE DAILY  180 tablet  3  . HYDROcodone-acetaminophen (NORCO/VICODIN) 5-325 MG per tablet       . ibuprofen (ADVIL,MOTRIN) 800 MG tablet       . metFORMIN (GLUCOPHAGE) 1000 MG tablet 1,000 mg 2 (two) times daily with a meal.       . metoprolol succinate (TOPROL-XL) 50 MG 24 hr tablet       . omeprazole (PRILOSEC) 20 MG capsule 20 mg.       . ramipril (ALTACE) 10 MG capsule 10 mg 2 (two) times daily.       Marland Kitchen ZETIA 10 MG tablet Take 10 mg by mouth daily.        No current facility-administered medications for this visit.    Allergies:    Allergies  Allergen Reactions  . Lipitor [Atorvastatin]     Muscle aches  . Simvastatin     Muscle aches  . Zithromax [Azithromycin]     GI-SIDE EFFECTS    Social History:  The patient  reports that he has quit smoking. He does not have  any smokeless tobacco history on file. He reports that he does not drink alcohol or use illicit drugs.   Family History:  The patient's family history includes CVA in his brother; Diabetes in his brother, brother, and brother; Heart disease in his brother; Hypertension in his brother, brother, and brother; Prostate cancer in his brother.   ROS:  Please see the history of present illness.  No nausea, vomiting.  No fevers, chills.  No focal weakness.  No dysuria.    All other systems reviewed and negative.   PHYSICAL EXAM: VS:  BP 126/78  Pulse 78  Ht 5\' 11"  (1.803 m)  Wt 289 lb 12.8 oz (131.452 kg)  BMI 40.44 kg/m2 Well nourished, well developed, in no acute distress HEENT: normal Neck: no JVD, no carotid bruits Cardiac:  normal S1, S2; irregularyirregular, normal rate Lungs:  clear to auscultation bilaterally, no wheezing, rhonchi or rales Abd: soft, nontender, no hepatomegaly Ext: no edema Skin: warm and dry Neuro:   no focal abnormalities noted  EKG:  AFib with RVR     ASSESSMENT AND PLAN:  1. AFib: New onset.  ECHO in 4/12: Normal LV function at that time.   Mild AI, mildly dilated aortic root.  Repeat echo.  Discussed anticoagulation.  Started a NOAC.  Switched amlodipine to Cardizem 240 mg daily.  Continue metoprolol. Controlled electrolytes in 2/15. Started Eliquis 5 mg twice a day.  Chads score is 3.  Check TSH. May have to decrease fish oil with Eliquis.  Stop aspirin.  Plan for cardioversion next week since he has been on his anticoagulation. He has not felt like his usual self for several months. This may be related to his atrial fibrillation and loss of atrial kick. The procedure was explained to the patient. All questions were answered. 2. SHOB:  I wonder if part of this is coming from diastolic dysfunction due to the atrial fibrillation.  Continue twice a day furosemide. Unclear when his atrial fibrillation actually started. It is likely after February, since he had knee surgery at that time and there is no mention made. 3. Obesity: Stressed the importance of weight loss to help prevent atrial fibrillation.  He has lost over 10 lbs in the psat 2 months.  I encouraged him to continue the lifestyle modifications that would lead to more weight loss. 4. OSA: Wearing CPAP as tolerated.  Signed, Mina Marble, MD, [facility] 08/31/2013 10:26 AM

## 2013-08-31 NOTE — Patient Instructions (Signed)
Your physician recommends that you return for lab work today for cbc with diff, bmet, and pt/inr.  Your physician has recommended that you have a Cardioversion (DCCV). Electrical Cardioversion uses a jolt of electricity to your heart either through paddles or wired patches attached to your chest. This is a controlled, usually prescheduled, procedure. Defibrillation is done under light anesthesia in the hospital, and you usually go home the day of the procedure. This is done to get your heart back into a normal rhythm. You are not awake for the procedure. Please see the instruction sheet given to you today.

## 2013-09-01 ENCOUNTER — Telehealth: Payer: Self-pay | Admitting: Interventional Cardiology

## 2013-09-01 DIAGNOSIS — E876 Hypokalemia: Secondary | ICD-10-CM

## 2013-09-01 MED ORDER — POTASSIUM CHLORIDE CRYS ER 20 MEQ PO TBCR: EXTENDED_RELEASE_TABLET | ORAL | Status: DC

## 2013-09-01 NOTE — Telephone Encounter (Signed)
Called Pt's wife, and explained need for K supplementation. Wife states Pt will not be home until late tonight, therefore he cannot start taking med until tomorrow a.m. Explained to wife that Pt needs to come in on 4/13 to recheck BMET. Wife in understanding. Confirmed Pharmacy with wife.

## 2013-09-01 NOTE — Telephone Encounter (Signed)
Patient is returning your call. He will be out of the house remainder of the day. You can leave a detailed message with patients wife.

## 2013-09-05 ENCOUNTER — Other Ambulatory Visit: Payer: Self-pay | Admitting: Interventional Cardiology

## 2013-09-05 ENCOUNTER — Other Ambulatory Visit (INDEPENDENT_AMBULATORY_CARE_PROVIDER_SITE_OTHER): Payer: Medicare Other

## 2013-09-05 ENCOUNTER — Encounter: Payer: Self-pay | Admitting: General Surgery

## 2013-09-05 DIAGNOSIS — E876 Hypokalemia: Secondary | ICD-10-CM

## 2013-09-05 LAB — BASIC METABOLIC PANEL
BUN: 15 mg/dL (ref 6–23)
CALCIUM: 9.1 mg/dL (ref 8.4–10.5)
CO2: 28 meq/L (ref 19–32)
CREATININE: 1 mg/dL (ref 0.4–1.5)
Chloride: 103 mEq/L (ref 96–112)
GFR: 75.56 mL/min (ref >60.00)
Glucose, Bld: 207 mg/dL — ABNORMAL HIGH (ref 70–99)
Potassium: 3.9 mEq/L (ref 3.5–5.1)
SODIUM: 141 meq/L (ref 135–145)

## 2013-09-06 ENCOUNTER — Ambulatory Visit (HOSPITAL_COMMUNITY)
Admission: RE | Admit: 2013-09-06 | Discharge: 2013-09-06 | Disposition: A | Discharge status: Home / Self Care | Payer: Medicare Other | Source: Ambulatory Visit | Attending: Interventional Cardiology | Admitting: Interventional Cardiology

## 2013-09-06 ENCOUNTER — Encounter (HOSPITAL_COMMUNITY)
Admission: RE | Disposition: A | Discharge status: Home / Self Care | Payer: Self-pay | Source: Ambulatory Visit | Attending: Interventional Cardiology

## 2013-09-06 ENCOUNTER — Ambulatory Visit (HOSPITAL_COMMUNITY): Payer: Medicare Other | Admitting: Certified Registered"

## 2013-09-06 ENCOUNTER — Encounter (HOSPITAL_COMMUNITY): Payer: Medicare Other | Admitting: Certified Registered"

## 2013-09-06 ENCOUNTER — Encounter (HOSPITAL_COMMUNITY): Payer: Self-pay | Admitting: Certified Registered"

## 2013-09-06 DIAGNOSIS — E785 Hyperlipidemia, unspecified: Secondary | ICD-10-CM | POA: Insufficient documentation

## 2013-09-06 DIAGNOSIS — Z87891 Personal history of nicotine dependence: Secondary | ICD-10-CM | POA: Insufficient documentation

## 2013-09-06 DIAGNOSIS — J4489 Other specified chronic obstructive pulmonary disease: Secondary | ICD-10-CM | POA: Insufficient documentation

## 2013-09-06 DIAGNOSIS — I10 Essential (primary) hypertension: Secondary | ICD-10-CM | POA: Insufficient documentation

## 2013-09-06 DIAGNOSIS — E1142 Type 2 diabetes mellitus with diabetic polyneuropathy: Secondary | ICD-10-CM | POA: Insufficient documentation

## 2013-09-06 DIAGNOSIS — I4891 Unspecified atrial fibrillation: Secondary | ICD-10-CM | POA: Insufficient documentation

## 2013-09-06 DIAGNOSIS — D649 Anemia, unspecified: Secondary | ICD-10-CM | POA: Insufficient documentation

## 2013-09-06 DIAGNOSIS — G4733 Obstructive sleep apnea (adult) (pediatric): Secondary | ICD-10-CM | POA: Insufficient documentation

## 2013-09-06 DIAGNOSIS — Z6841 Body Mass Index (BMI) 40.0 and over, adult: Secondary | ICD-10-CM | POA: Insufficient documentation

## 2013-09-06 DIAGNOSIS — Z7901 Long term (current) use of anticoagulants: Secondary | ICD-10-CM | POA: Insufficient documentation

## 2013-09-06 DIAGNOSIS — Z8546 Personal history of malignant neoplasm of prostate: Secondary | ICD-10-CM | POA: Insufficient documentation

## 2013-09-06 DIAGNOSIS — K219 Gastro-esophageal reflux disease without esophagitis: Secondary | ICD-10-CM | POA: Insufficient documentation

## 2013-09-06 DIAGNOSIS — J449 Chronic obstructive pulmonary disease, unspecified: Secondary | ICD-10-CM | POA: Insufficient documentation

## 2013-09-06 DIAGNOSIS — E1149 Type 2 diabetes mellitus with other diabetic neurological complication: Secondary | ICD-10-CM | POA: Insufficient documentation

## 2013-09-06 HISTORY — PX: CARDIOVERSION: SHX1299

## 2013-09-06 LAB — GLUCOSE, CAPILLARY: Glucose-Capillary: 151 mg/dL — ABNORMAL HIGH (ref 70–99)

## 2013-09-06 SURGERY — CARDIOVERSION
Anesthesia: General

## 2013-09-06 MED ORDER — PROPOFOL 10 MG/ML IV BOLUS
INTRAVENOUS | Status: AC
  Filled 2013-09-06: qty 20

## 2013-09-06 MED ORDER — SUCCINYLCHOLINE CHLORIDE 20 MG/ML IJ SOLN
INTRAMUSCULAR | Status: AC
  Filled 2013-09-06: qty 1

## 2013-09-06 MED ORDER — LIDOCAINE HCL (CARDIAC) 20 MG/ML IV SOLN
INTRAVENOUS | Status: AC
  Filled 2013-09-06: qty 5

## 2013-09-06 MED ORDER — LIDOCAINE HCL (CARDIAC) 20 MG/ML IV SOLN
INTRAVENOUS | Status: DC | PRN
  Administered 2013-09-06: 50 mg via INTRAVENOUS

## 2013-09-06 MED ORDER — SODIUM CHLORIDE 0.9 % IV SOLN
INTRAVENOUS | Status: DC
  Administered 2013-09-06: 500 mL via INTRAVENOUS

## 2013-09-06 MED ORDER — PROPOFOL 10 MG/ML IV BOLUS
INTRAVENOUS | Status: DC | PRN
  Administered 2013-09-06: 70 mg via INTRAVENOUS

## 2013-09-06 NOTE — Discharge Instructions (Signed)
Can use hydrocortisone creme OTC if redness occurs on the cardioversion site.

## 2013-09-06 NOTE — H&P (View-Only) (Signed)
Patient ID: LOGON UTTECH, male   DOB: [birth year], 73 y.o.   MRN: [id number] Patient ID: COSTANTINO KOHLBECK, male   DOB: Jan 06, 1941, 73 y.o.   MRN: [id number]    [place], [address], [place]  [address] Phone: [phone] Fax:  [phone]  Date:  08/31/2013   ID:  JARRICK FJELD, DOB [birth year], MRN [id number]  PCP:  [name], MD      History of Present Illness: Steve Jensen is a 73 y.o. male who has had swelling and SHOB. He was diagnosed with bronchitis and pneumonia a week ago.  Marland Kitchen He is using CPAP until the past week. He feels more fatigued. Works in the yard for exercise. No regular walking.  No swelling.  He had knee surgery in 2/15 and was not told he was in AFib at that time.  Hypertension:  c/o Leg edema more at the end of the day- same as usual.  Denies : Chest pain.  Dizziness.  Orthopnea.  Paroxysmal nocturnal dyspnea.  Palpitations.  Syncope.   One espisode of SHOB in the morning at his usual time, where it felt bettter sitting up.  No further episode.  No excessive salt intake.   Wt Readings from Last 3 Encounters:  08/31/13 289 lb 12.8 oz (131.452 kg)  08/11/13 294 lb (133.358 kg)  06/27/13 302 lb (136.986 kg)     Past Medical History  Diagnosis Date  . Hypertension   . Hyperlipidemia   . Anemia   . Morbid obesity   . Diabetes mellitus type II, controlled     with neuropathy  . Prostate cancer   . GERD (gastroesophageal reflux disease)   . Shingles     on face Nov 2010  . OSA (obstructive sleep apnea)     Current Outpatient Prescriptions  Medication Sig Dispense Refill  . amLODipine (NORVASC) 10 MG tablet       . apixaban (ELIQUIS) 5 MG TABS tablet Take 1 tablet (5 mg total) by mouth 2 (two) times daily.  60 tablet  6  . diltiazem (CARTIA XT) 240 MG 24 hr capsule Take 1 capsule (240 mg total) by mouth daily.  30 capsule  6  . fish oil-omega-3 fatty acids 1000 MG capsule Take 1,200 mg by mouth 2 (two) times daily.      . furosemide  (LASIX) 40 MG tablet TAKE 1 TABLET BY MOUTH TWICE DAILY  180 tablet  3  . HYDROcodone-acetaminophen (NORCO/VICODIN) 5-325 MG per tablet       . ibuprofen (ADVIL,MOTRIN) 800 MG tablet       . metFORMIN (GLUCOPHAGE) 1000 MG tablet 1,000 mg 2 (two) times daily with a meal.       . metoprolol succinate (TOPROL-XL) 50 MG 24 hr tablet       . omeprazole (PRILOSEC) 20 MG capsule 20 mg.       . ramipril (ALTACE) 10 MG capsule 10 mg 2 (two) times daily.       Marland Kitchen ZETIA 10 MG tablet Take 10 mg by mouth daily.        No current facility-administered medications for this visit.    Allergies:    Allergies  Allergen Reactions  . Lipitor [Atorvastatin]     Muscle aches  . Simvastatin     Muscle aches  . Zithromax [Azithromycin]     GI-SIDE EFFECTS    Social History:  The patient  reports that he has quit smoking. He does not have  any smokeless tobacco history on file. He reports that he does not drink alcohol or use illicit drugs.   Family History:  The patient's family history includes CVA in his brother; Diabetes in his brother, brother, and brother; Heart disease in his brother; Hypertension in his brother, brother, and brother; Prostate cancer in his brother.   ROS:  Please see the history of present illness.  No nausea, vomiting.  No fevers, chills.  No focal weakness.  No dysuria.    All other systems reviewed and negative.   PHYSICAL EXAM: VS:  BP 126/78  Pulse 78  Ht 5\' 11"  (1.803 m)  Wt 289 lb 12.8 oz (131.452 kg)  BMI 40.44 kg/m2 Well nourished, well developed, in no acute distress HEENT: normal Neck: no JVD, no carotid bruits Cardiac:  normal S1, S2; irregularyirregular, normal rate Lungs:  clear to auscultation bilaterally, no wheezing, rhonchi or rales Abd: soft, nontender, no hepatomegaly Ext: no edema Skin: warm and dry Neuro:   no focal abnormalities noted  EKG:  AFib with RVR     ASSESSMENT AND PLAN:  1. AFib: New onset.  ECHO in 4/12: Normal LV function at that time.   Mild AI, mildly dilated aortic root.  Repeat echo.  Discussed anticoagulation.  Started a NOAC.  Switched amlodipine to Cardizem 240 mg daily.  Continue metoprolol. Controlled electrolytes in 2/15. Started Eliquis 5 mg twice a day.  Chads score is 3.  Check TSH. May have to decrease fish oil with Eliquis.  Stop aspirin.  Plan for cardioversion next week since he has been on his anticoagulation. He has not felt like his usual self for several months. This may be related to his atrial fibrillation and loss of atrial kick. The procedure was explained to the patient. All questions were answered. 2. SHOB:  I wonder if part of this is coming from diastolic dysfunction due to the atrial fibrillation.  Continue twice a day furosemide. Unclear when his atrial fibrillation actually started. It is likely after February, since he had knee surgery at that time and there is no mention made. 3. Obesity: Stressed the importance of weight loss to help prevent atrial fibrillation.  He has lost over 10 lbs in the psat 2 months.  I encouraged him to continue the lifestyle modifications that would lead to more weight loss. 4. OSA: Wearing CPAP as tolerated.  Signed, Mina Marble, MD, [facility] 08/31/2013 10:26 AM

## 2013-09-06 NOTE — Interval H&P Note (Signed)
History and Physical Interval Note:  09/06/2013 9:22 AM  Steve Jensen  has presented today for surgery, with the diagnosis of A FIB  The various methods of treatment have been discussed with the patient and family. After consideration of risks, benefits and other options for treatment, the patient has consented to  Procedure(s): CARDIOVERSION (N/A) as a surgical intervention .  The patient's history has been reviewed, patient examined, no change in status, stable for surgery.  I have reviewed the patient's chart and labs.  Questions were answered to the patient's satisfaction.     [name]

## 2013-09-06 NOTE — Anesthesia Postprocedure Evaluation (Signed)
  Anesthesia Post-op Note  Patient: Steve Jensen  Procedure(s) Performed: Procedure(s): CARDIOVERSION (N/A)  Patient Location: Endoscopy Unit  Anesthesia Type:General  Level of Consciousness: sedated  Airway and Oxygen Therapy: Patient Spontanous Breathing and Patient connected to nasal cannula oxygen  Post-op Pain: none  Post-op Assessment: Post-op Vital signs reviewed, Patient's Cardiovascular Status Stable, Respiratory Function Stable, Patent Airway and No signs of Nausea or vomiting  Post-op Vital Signs: Reviewed and stable  Last Vitals:  Filed Vitals:   09/06/13 0929  BP: 109/65  Pulse: 67  Temp:   Resp: 25    Complications: No apparent anesthesia complications

## 2013-09-06 NOTE — Addendum Note (Signed)
Addendum created 09/06/13 0944 by Kate Sable, MD   Modules edited: Anesthesia Attestations, Anesthesia Events

## 2013-09-06 NOTE — Transfer of Care (Signed)
Immediate Anesthesia Transfer of Care Note  Patient: Steve Jensen  Procedure(s) Performed: Procedure(s): CARDIOVERSION (N/A)  Patient Location: Endoscopy Unit  Anesthesia Type:General  Level of Consciousness: sedated  Airway & Oxygen Therapy: Patient Spontanous Breathing and Patient connected to nasal cannula oxygen  Post-op Assessment: Report given to PACU RN, Post -op Vital signs reviewed and stable and Patient moving all extremities  Post vital signs: Reviewed and stable  Complications: No apparent anesthesia complications

## 2013-09-06 NOTE — Anesthesia Preprocedure Evaluation (Addendum)
Anesthesia Evaluation  Patient identified by MRN, date of birth, ID band Patient awake    Reviewed: Allergy & Precautions, H&P , NPO status , Patient's Chart, lab work & pertinent test results  Airway Mallampati: II TM Distance: >3 FB Neck ROM: Full    Dental  (+) Teeth Intact, Dental Advisory Given   Pulmonary sleep apnea , COPDformer smoker,          Cardiovascular hypertension, + dysrhythmias Atrial Fibrillation     Neuro/Psych    GI/Hepatic GERD-  ,  Endo/Other  diabetes, Type 2, Oral Hypoglycemic AgentsMorbid obesity  Renal/GU      Musculoskeletal   Abdominal   Peds  Hematology   Anesthesia Other Findings   Reproductive/Obstetrics                          Anesthesia Physical Anesthesia Plan  ASA: III  Anesthesia Plan: General   Post-op Pain Management:    Induction: Intravenous  Airway Management Planned: Mask  Additional Equipment:   Intra-op Plan:   Post-operative Plan:   Informed Consent: I have reviewed the patients History and Physical, chart, labs and discussed the procedure including the risks, benefits and alternatives for the proposed anesthesia with the patient or authorized representative who has indicated his/her understanding and acceptance.   Dental advisory given  Plan Discussed with: CRNA, Anesthesiologist and Surgeon  Anesthesia Plan Comments:        Anesthesia Quick Evaluation

## 2013-09-06 NOTE — CV Procedure (Signed)
Electrical Cardioversion Procedure Note Steve Jensen [id number] 03-12-1941  Procedure: Electrical Cardioversion Indications:  Atrial Fibrillation  Time Out: Verified patient identification, verified procedure,medications/allergies/relevent history reviewed, required imaging and test results available.  Performed  Procedure Details  The patient was NPO after midnight. Anesthesia was administered at the beside  by Dr.Smith with 60mg  of propofol, 40 mg of Lidocaine.  Cardioversion was done with synchronized biphasic defibrillation with AP pads with 120 J.  The patient converted to normal sinus rhythm. The patient tolerated the procedure well   IMPRESSION:  Successful cardioversion of atrial fibrillation to NSR.    Steve Jensen 09/06/2013, 9:33 AM

## 2013-09-07 ENCOUNTER — Encounter (HOSPITAL_COMMUNITY): Payer: Self-pay | Admitting: Interventional Cardiology

## 2013-09-09 ENCOUNTER — Encounter: Payer: Self-pay | Admitting: Interventional Cardiology

## 2013-09-09 ENCOUNTER — Other Ambulatory Visit: Payer: Self-pay | Admitting: Family Medicine

## 2013-09-09 ENCOUNTER — Ambulatory Visit
Admission: RE | Admit: 2013-09-09 | Discharge: 2013-09-09 | Disposition: A | Discharge status: Home / Self Care | Payer: Medicare Other | Source: Ambulatory Visit | Attending: Family Medicine | Admitting: Family Medicine

## 2013-09-09 DIAGNOSIS — J189 Pneumonia, unspecified organism: Secondary | ICD-10-CM

## 2013-09-19 ENCOUNTER — Ambulatory Visit: Payer: Medicare Other | Admitting: Interventional Cardiology

## 2013-09-27 ENCOUNTER — Other Ambulatory Visit: Payer: Self-pay | Admitting: Family Medicine

## 2013-09-27 ENCOUNTER — Ambulatory Visit
Admission: RE | Admit: 2013-09-27 | Discharge: 2013-09-27 | Disposition: A | Discharge status: Home / Self Care | Payer: Medicare Other | Source: Ambulatory Visit | Attending: Family Medicine | Admitting: Family Medicine

## 2013-09-27 ENCOUNTER — Encounter: Payer: Self-pay | Admitting: Physician Assistant

## 2013-09-27 ENCOUNTER — Ambulatory Visit (INDEPENDENT_AMBULATORY_CARE_PROVIDER_SITE_OTHER): Payer: Medicare Other | Admitting: Physician Assistant

## 2013-09-27 VITALS — BP 120/70 | HR 60 | Ht 71.0 in | Wt 288.0 lb

## 2013-09-27 DIAGNOSIS — J189 Pneumonia, unspecified organism: Secondary | ICD-10-CM

## 2013-09-27 DIAGNOSIS — R0602 Shortness of breath: Secondary | ICD-10-CM

## 2013-09-27 DIAGNOSIS — I10 Essential (primary) hypertension: Secondary | ICD-10-CM

## 2013-09-27 DIAGNOSIS — G4733 Obstructive sleep apnea (adult) (pediatric): Secondary | ICD-10-CM

## 2013-09-27 DIAGNOSIS — I4891 Unspecified atrial fibrillation: Secondary | ICD-10-CM

## 2013-09-27 DIAGNOSIS — E782 Mixed hyperlipidemia: Secondary | ICD-10-CM

## 2013-09-27 DIAGNOSIS — E876 Hypokalemia: Secondary | ICD-10-CM

## 2013-09-27 MED ORDER — POTASSIUM CHLORIDE CRYS ER 20 MEQ PO TBCR: 20.0000 meq | EXTENDED_RELEASE_TABLET | Freq: Two times a day (BID) | ORAL | Status: DC

## 2013-09-27 NOTE — Patient Instructions (Signed)
Your physician wants you to follow-up in: [place] DR. VARANASI. You will receive a reminder letter in the mail two months in advance. If you don't receive a letter, please call our office to schedule the follow-up appointment.   NO CHANGES WERE MADE TODAY

## 2013-09-27 NOTE — Progress Notes (Signed)
[place], [place], [place]  [address] Phone: [phone] Fax:  [phone]  Date:  09/27/2013   ID:  Steve Jensen, DOB December 10, 1940, MRN [id number]  PCP:  [name], MD  Cardiologist:  Dr. Casandra Doffing      History of Present Illness: Steve Jensen is a 73 y.o. male with a history of HTN, HL, sleep apnea, diabetes. He was recently noted to be in atrial fibrillation around the time of knee surgery in February.  He was placed on anticoagulation and underwent DCCV 09/06/13.  He returns for follow up.    He is doing well. He is still short of breath with mod activities.  He is NYHA 2b.  He has been this way since being dx with bronchitis/pneumonia.  Recent CXR demonstrates residual airspace disease in RLL.  He has another CXR today.  He denies chest pain, syncope, orthopnea, PND.  LE edema is stable.  Studies:   - Echo (07/2013):  Mild LVH, EF 55-60%, mild MR, mod LAE, mild RVE, mildly reduced RVSF   Recent Labs: 08/11/2013: TSH 2.83  08/31/2013: Hemoglobin 12.8*  09/05/2013: Creatinine 1.0; Potassium 3.9   Wt Readings from Last 3 Encounters:  09/27/13 288 lb (130.636 kg)  09/06/13 289 lb (131.09 kg)  09/06/13 289 lb (131.09 kg)     Past Medical History  Diagnosis Date  . Hypertension   . Hyperlipidemia   . Anemia   . Morbid obesity   . Diabetes mellitus type II, controlled     with neuropathy  . Prostate cancer   . GERD (gastroesophageal reflux disease)   . Shingles     on face Nov 2010  . OSA (obstructive sleep apnea)     Current Outpatient Prescriptions  Medication Sig Dispense Refill  . amLODipine (NORVASC) 10 MG tablet Take 10 mg by mouth daily.       Marland Kitchen apixaban (ELIQUIS) 5 MG TABS tablet Take 1 tablet (5 mg total) by mouth 2 (two) times daily.  60 tablet  6  . diltiazem (CARTIA XT) 240 MG 24 hr capsule Take 1 capsule (240 mg total) by mouth daily.  30 capsule  6  . fish oil-omega-3 fatty acids 1000 MG capsule Take 1,200 mg by mouth 2 (two)  times daily.      . furosemide (LASIX) 40 MG tablet TAKE 1 TABLET BY MOUTH TWICE DAILY  180 tablet  3  . HYDROcodone-acetaminophen (NORCO/VICODIN) 5-325 MG per tablet Take 1-2 tablets by mouth every 4 (four) hours as needed.       Marland Kitchen ibuprofen (ADVIL,MOTRIN) 800 MG tablet prn      . metFORMIN (GLUCOPHAGE) 1000 MG tablet Take 1,000 mg by mouth 2 (two) times daily with a meal.       . metoprolol succinate (TOPROL-XL) 50 MG 24 hr tablet Take 50 mg by mouth daily.       Marland Kitchen omeprazole (PRILOSEC) 20 MG capsule Take 20 mg by mouth daily.       . potassium chloride SA (K-DUR,KLOR-CON) 20 MEQ tablet 2 tabs BID times one day, then one tab BID  34 tablet  3  . ramipril (ALTACE) 10 MG capsule Take 10 mg by mouth daily.       Marland Kitchen ZETIA 10 MG tablet Take 10 mg by mouth daily.        No current facility-administered medications for this visit.    Allergies:   Lipitor; Simvastatin; and Zithromax   Social History:  The patient  reports that he has quit smoking. He does not have any smokeless tobacco history on file. He reports that he does not drink alcohol or use illicit drugs.   Family History:  The patient's family history includes CVA in his brother; Diabetes in his brother, brother, and brother; Heart disease in his brother; Hypertension in his brother, brother, and brother; Prostate cancer in his brother.   ROS:  Please see the history of present illness.   No bleeding problems.   All other systems reviewed and negative.   PHYSICAL EXAM: VS:  BP 120/70  Pulse 60  Ht 5\' 11"  (1.803 m)  Wt 288 lb (130.636 kg)  BMI 40.19 kg/m2 Well nourished, well developed, in no acute distress HEENT: normal Neck: no JVD Cardiac:  normal S1, S2; RRR; no murmur Lungs:  clear to auscultation bilaterally, no wheezing, rhonchi or rales Abd: soft, nontender, no hepatomegaly Ext: no edema Skin: warm and dry Neuro:  CNs 2-12 intact, no focal abnormalities noted  EKG:  Sinus brady, HR 60, normal axis, PRWP, NSSTTW  changes     ASSESSMENT AND PLAN:  1. Atrial fibrillation:  He is maintaining NSR.  CHADS2-VASc=3.  He requires long-term anticoagulation. He may have difficulty affording Eliquis. I have asked him to keep [place] apprised of his ability to obtaint his medication. We could consider switching him to Xarelto or possibly Coumadin.  2. Shortness of breath: Likely related to previous bronchitis/pneumonia with residual airspace disease. Continue followup with primary care as planned. 3. Essential hypertension, benign: Controlled. 4. Mixed hyperlipidemia: Continue Zetia. 5. Obstructive sleep apnea (adult) (pediatric): Continue CPAP. 6. Hypokalemia: Recent potassium normal. 7. Disposition: Followup with Dr. Casandra Doffing in 3 mos.  Signed, Richardson Dopp, PA-C  09/27/2013 11:48 AM

## 2013-10-25 ENCOUNTER — Other Ambulatory Visit: Payer: Self-pay | Admitting: Family Medicine

## 2013-10-25 ENCOUNTER — Ambulatory Visit
Admission: RE | Admit: 2013-10-25 | Discharge: 2013-10-25 | Disposition: A | Discharge status: Home / Self Care | Payer: Medicare Other | Source: Ambulatory Visit | Attending: Family Medicine | Admitting: Family Medicine

## 2013-10-25 DIAGNOSIS — J189 Pneumonia, unspecified organism: Secondary | ICD-10-CM

## 2013-10-27 ENCOUNTER — Other Ambulatory Visit: Payer: Self-pay | Admitting: Family Medicine

## 2013-10-27 DIAGNOSIS — J189 Pneumonia, unspecified organism: Secondary | ICD-10-CM

## 2013-10-31 ENCOUNTER — Ambulatory Visit
Admission: RE | Admit: 2013-10-31 | Discharge: 2013-10-31 | Disposition: A | Discharge status: Home / Self Care | Payer: Medicare Other | Source: Ambulatory Visit | Attending: Family Medicine | Admitting: Family Medicine

## 2013-10-31 DIAGNOSIS — J189 Pneumonia, unspecified organism: Secondary | ICD-10-CM

## 2013-10-31 MED ORDER — IOHEXOL 300 MG/ML  SOLN
100.0000 mL | Freq: Once | INTRAMUSCULAR | Status: AC | PRN
  Administered 2013-10-31: 100 mL via INTRAVENOUS

## 2013-12-05 ENCOUNTER — Other Ambulatory Visit: Payer: Self-pay | Admitting: Family Medicine

## 2013-12-05 ENCOUNTER — Ambulatory Visit
Admission: RE | Admit: 2013-12-05 | Discharge: 2013-12-05 | Disposition: A | Discharge status: Home / Self Care | Payer: Medicare Other | Source: Ambulatory Visit | Attending: Family Medicine | Admitting: Family Medicine

## 2013-12-05 DIAGNOSIS — J189 Pneumonia, unspecified organism: Secondary | ICD-10-CM

## 2014-02-08 ENCOUNTER — Other Ambulatory Visit: Payer: Medicare Other

## 2014-02-08 ENCOUNTER — Ambulatory Visit (INDEPENDENT_AMBULATORY_CARE_PROVIDER_SITE_OTHER): Payer: Medicare Other | Admitting: Cardiology

## 2014-02-08 ENCOUNTER — Encounter: Payer: Self-pay | Admitting: Cardiology

## 2014-02-08 VITALS — BP 132/80 | HR 70 | Ht 71.0 in | Wt 293.4 lb

## 2014-02-08 DIAGNOSIS — G4733 Obstructive sleep apnea (adult) (pediatric): Secondary | ICD-10-CM

## 2014-02-08 DIAGNOSIS — I10 Essential (primary) hypertension: Secondary | ICD-10-CM

## 2014-02-08 NOTE — Progress Notes (Signed)
[place], [place], [place]  [address] Phone: [phone] Fax:  [phone]  Date:  02/08/2014   ID:  AHKEEM GOEDE, DOB [birth year], MRN [id number]  PCP:  [name], MD  Cardiologist:  Fransico Him, MD     History of Present Illness: Steve Jensen is a 73 y.o. male with a history of OSA on BiPAP, morbid obesity and HTN who presents today for followup. He tolerates his BiPAP therapy well. He tolerates the mask and feels the pressure is adequate. He does not feel rested when he gets up due to getting up so much at night due to his diuretic and takes a nap occasionally during the day.  He does not have any problems with nasal congestion.  Wt Readings from Last 3 Encounters:  02/08/14 293 lb 6.4 oz (133.085 kg)  09/27/13 288 lb (130.636 kg)  09/06/13 289 lb (131.09 kg)     Past Medical History  Diagnosis Date  . Hypertension   . Hyperlipidemia   . Anemia   . Morbid obesity   . Diabetes mellitus type II, controlled     with neuropathy  . Prostate cancer   . GERD (gastroesophageal reflux disease)   . Shingles     on face Nov 2010  . OSA (obstructive sleep apnea)     Current Outpatient Prescriptions  Medication Sig Dispense Refill  . apixaban (ELIQUIS) 5 MG TABS tablet Take 1 tablet (5 mg total) by mouth 2 (two) times daily.  60 tablet  6  . diltiazem (CARTIA XT) 240 MG 24 hr capsule Take 1 capsule (240 mg total) by mouth daily.  30 capsule  6  . fish oil-omega-3 fatty acids 1000 MG capsule Take 1,200 mg by mouth 2 (two) times daily.      . furosemide (LASIX) 40 MG tablet TAKE 1 TABLET BY MOUTH TWICE DAILY  180 tablet  3  . HYDROcodone-acetaminophen (NORCO/VICODIN) 5-325 MG per tablet Take 1-2 tablets by mouth every 4 (four) hours as needed.       Marland Kitchen ibuprofen (ADVIL,MOTRIN) 800 MG tablet Take 800 mg by mouth every 6 (six) hours as needed. prn      . metoprolol succinate (TOPROL-XL) 50 MG 24 hr tablet Take 50 mg by mouth daily.       Marland Kitchen omeprazole (PRILOSEC)  20 MG capsule Take 20 mg by mouth daily.       . potassium chloride SA (K-DUR,KLOR-CON) 20 MEQ tablet Take 1 tablet (20 mEq total) by mouth 2 (two) times daily.  180 tablet  3  . ramipril (ALTACE) 10 MG capsule Take 10 mg by mouth daily.       . SitaGLIPtin-MetFORMIN HCl (JANUMET XR PO) Take 1 tablet by mouth daily.       Marland Kitchen ZETIA 10 MG tablet Take 10 mg by mouth daily.        No current facility-administered medications for this visit.    Allergies:    Allergies  Allergen Reactions  . Lipitor [Atorvastatin]     Muscle aches  . Simvastatin     Muscle aches  . Zithromax [Azithromycin]     GI-SIDE EFFECTS    Social History:  The patient  reports that he has quit smoking. He does not have any smokeless tobacco history on file. He reports that he does not drink alcohol or use illicit drugs.   Family History:  The patient's family history includes CVA in his brother; Diabetes in his brother, brother, and  brother; Heart disease in his brother; Hypertension in his brother, brother, and brother; Prostate cancer in his brother.   ROS:  Please see the history of present illness.      All other systems reviewed and negative.   PHYSICAL EXAM: VS:  BP 132/80  Pulse 70  Ht 5\' 11"  (1.803 m)  Wt 293 lb 6.4 oz (133.085 kg)  BMI 40.94 kg/m2 Well nourished, well developed, in no acute distress HEENT: normal Neck: no JVD Cardiac:  normal S1, S2; RRR; no murmur Lungs:  clear to auscultation bilaterally, no wheezing, rhonchi or rales Abd: soft, nontender, no hepatomegaly Ext: no edema Skin: warm and dry Neuro:  CNs 2-12 intact, no focal abnormalities noted     ASSESSMENT AND PLAN:  1. OSA on BiPAP ASV - his download today showed an AHI of 0.5/hr but only 55% compliance in using more than 4 hours nightly.   He is tolerating the device. - I recommended that he try to increase his usage to at least 5 hours nightly.  2. HTN - well controlled  - continue amlodipine/ramipril  3. Morbid obesity    Followup with me in 6 months   Signed, Fransico Him, MD 02/08/2014 2:03 PM

## 2014-02-08 NOTE — Patient Instructions (Signed)
Your physician recommends that you continue on your current medications as directed. Please refer to the Current Medication list given to you today.  Your physician wants you to follow-up in: 6 months with Dr Turner You will receive a reminder letter in the mail two months in advance. If you don't receive a letter, please call our office to schedule the follow-up appointment.  

## 2014-02-13 ENCOUNTER — Other Ambulatory Visit: Payer: Medicare Other

## 2014-02-27 ENCOUNTER — Other Ambulatory Visit: Payer: Self-pay | Admitting: Interventional Cardiology

## 2014-03-09 ENCOUNTER — Other Ambulatory Visit (INDEPENDENT_AMBULATORY_CARE_PROVIDER_SITE_OTHER): Payer: Medicare Other | Admitting: *Deleted

## 2014-03-09 ENCOUNTER — Encounter: Payer: Self-pay | Admitting: Interventional Cardiology

## 2014-03-09 ENCOUNTER — Ambulatory Visit (INDEPENDENT_AMBULATORY_CARE_PROVIDER_SITE_OTHER): Payer: Medicare Other | Admitting: Interventional Cardiology

## 2014-03-09 VITALS — BP 130/78 | HR 67 | Ht 71.0 in | Wt 294.8 lb

## 2014-03-09 DIAGNOSIS — E782 Mixed hyperlipidemia: Secondary | ICD-10-CM

## 2014-03-09 DIAGNOSIS — I10 Essential (primary) hypertension: Secondary | ICD-10-CM

## 2014-03-09 DIAGNOSIS — I4891 Unspecified atrial fibrillation: Secondary | ICD-10-CM

## 2014-03-09 DIAGNOSIS — I480 Paroxysmal atrial fibrillation: Secondary | ICD-10-CM

## 2014-03-09 LAB — BASIC METABOLIC PANEL
BUN: 15 mg/dL (ref 6–23)
CHLORIDE: 104 meq/L (ref 96–112)
CO2: 28 meq/L (ref 19–32)
Calcium: 9.4 mg/dL (ref 8.4–10.5)
Creatinine, Ser: 1 mg/dL (ref 0.4–1.5)
GFR: 74.61 mL/min (ref >60.00)
Glucose, Bld: 92 mg/dL (ref 70–99)
POTASSIUM: 3.8 meq/L (ref 3.5–5.1)
Sodium: 139 mEq/L (ref 135–145)

## 2014-03-09 LAB — CBC
HCT: 43.3 % (ref 39.0–52.0)
Hemoglobin: 14.2 g/dL (ref 13.0–17.0)
MCHC: 32.9 g/dL (ref 30.0–36.0)
MCV: 78.8 fl (ref 78.0–100.0)
PLATELETS: 339 10*3/uL (ref 150.0–400.0)
RBC: 5.49 Mil/uL (ref 4.22–5.81)
RDW: 16.6 % — ABNORMAL HIGH (ref 11.5–15.5)
WBC: 12.5 10*3/uL — AB (ref 4.0–10.5)

## 2014-03-09 NOTE — Progress Notes (Signed)
Patient ID: Steve Jensen, male   DOB: 26-Sep-1940, 73 y.o.   MRN: [id number] Patient ID: Steve Jensen, male   DOB: 04-28-1941, 73 y.o.   MRN: [id number]    [place], [place], [place]  [address] Phone: [phone] Fax:  [phone]  Date:  03/09/2014   ID:  Steve Jensen, DOB [birth year], MRN [id number]  PCP:  [name], MD      History of Present Illness: Steve Jensen is a 73 y.o. male who has had swelling and SHOB. He was diagnosed with bronchitis and pneumonia in early 2015 . He is using CPAP regularly. Works in the yard for exercise. No regular walking.  No swelling.  He had knee surgery in 2/15 and was not told he was in AFib at that time.  Hypertension:  c/o Leg edema more at the end of the day- same as usual.  Denies : Chest pain.  Dizziness.  Orthopnea.  Paroxysmal nocturnal dyspnea.  Palpitations.  Syncope.   Gained weight.  No bleeding problems. Eliquis  Wt Readings from Last 3 Encounters:  03/09/14 294 lb 12.8 oz (133.72 kg)  02/08/14 293 lb 6.4 oz (133.085 kg)  09/27/13 288 lb (130.636 kg)     Past Medical History  Diagnosis Date  . Hypertension   . Hyperlipidemia   . Anemia   . Morbid obesity   . Diabetes mellitus type II, controlled     with neuropathy  . Prostate cancer   . GERD (gastroesophageal reflux disease)   . Shingles     on face Nov 2010  . OSA (obstructive sleep apnea)     Current Outpatient Prescriptions  Medication Sig Dispense Refill  . apixaban (ELIQUIS) 5 MG TABS tablet Take 1 tablet (5 mg total) by mouth 2 (two) times daily.  60 tablet  6  . CARTIA XT 240 MG 24 hr capsule TAKE 1 CAPSULE BY MOUTH EVERY DAY  30 capsule  3  . fish oil-omega-3 fatty acids 1000 MG capsule Take 1,200 mg by mouth 2 (two) times daily.      . furosemide (LASIX) 40 MG tablet TAKE 1 TABLET BY MOUTH TWICE DAILY  180 tablet  3  . metoprolol succinate (TOPROL-XL) 50 MG 24 hr tablet Take 50 mg by mouth daily.       Marland Kitchen omeprazole (PRILOSEC)  20 MG capsule Take 20 mg by mouth daily.       . potassium chloride SA (K-DUR,KLOR-CON) 20 MEQ tablet Take 1 tablet (20 mEq total) by mouth 2 (two) times daily.  180 tablet  3  . ramipril (ALTACE) 10 MG capsule Take 10 mg by mouth daily.       . sitaGLIPtin-metformin (JANUMET) 50-1000 MG per tablet Take 1 tablet by mouth 2 (two) times daily with a meal.      . ZETIA 10 MG tablet Take 10 mg by mouth daily.       Marland Kitchen HYDROcodone-acetaminophen (NORCO/VICODIN) 5-325 MG per tablet Take 1-2 tablets by mouth every 4 (four) hours as needed.       Marland Kitchen ibuprofen (ADVIL,MOTRIN) 800 MG tablet Take 800 mg by mouth every 6 (six) hours as needed. prn       No current facility-administered medications for this visit.    Allergies:    Allergies  Allergen Reactions  . Lipitor [Atorvastatin]     Muscle aches  . Simvastatin     Muscle aches  . Zithromax [Azithromycin]     GI-SIDE  EFFECTS    Social History:  The patient  reports that he has quit smoking. He does not have any smokeless tobacco history on file. He reports that he does not drink alcohol or use illicit drugs.   Family History:  The patient's family history includes CVA in his brother; Diabetes in his brother, brother, and brother; Heart disease in his brother; Hypertension in his brother, brother, and brother; Prostate cancer in his brother.   ROS:  Please see the history of present illness.  No nausea, vomiting.  No fevers, chills.  No focal weakness.  No dysuria.    All other systems reviewed and negative.   PHYSICAL EXAM: VS:  BP 130/78  Pulse 67  Ht 5\' 11"  (1.803 m)  Wt 294 lb 12.8 oz (133.72 kg)  BMI 41.13 kg/m2 Well nourished, well developed, in no acute distress HEENT: normal Neck: no JVD, no carotid bruits Cardiac:  normal S1, S2; RRR Lungs:  clear to auscultation bilaterally, no wheezing, rhonchi or rales Abd: soft, nontender, no hepatomegaly Ext: no edema Skin: warm and dry Neuro:   no focal abnormalities noted  EKG:  NSR on  most recent ECG    ASSESSMENT AND PLAN:  1. AFib: New onset.  ECHO in 4/12: Normal LV function at that time.  Mild AI, mildly dilated aortic root.  Repeat echo.  Discussed anticoagulation.  Started a NOAC.  Switched amlodipine to Cardizem 240 mg daily.  Continue metoprolol. Controlled electrolytes in 2/15. Started Eliquis 5 mg twice a day.  Chads score is 3.  Check TSH. May have to decrease fish oil with Eliquis.  Stop aspirin. S/p cardioversion. 2. SHOB:  I wonder if part of this is coming from diastolic dysfunction.  Continue twice a day furosemide. Unclear when his atrial fibrillation actually started. It is likely after February, since he had knee surgery at that time and there is no mention made. 3. Obesity: Stressed the importance of weight loss to help prevent atrial fibrillation.  He has gained weight.  I encouraged him to continue the lifestyle modifications that would lead to more weight loss. 4. OSA: Wearing CPAP as tolerated. 5. Hyperlipidemia: Zetia.  Intolerant of statins. TG 244. LDL 93.  Signed, Mina Marble, MD, [facility] 03/09/2014 2:52 PM

## 2014-03-09 NOTE — Patient Instructions (Addendum)
Your physician wants you to follow-up in: 1 year with Dr. Irish Lack. You will receive a reminder letter in the mail two months in advance. If you don't receive a letter, please call our office to schedule the follow-up appointment.  Your physician recommends that you have lab work today: bmp/cbc  Your physician recommends that you continue on your current medications as directed. Please refer to the Current Medication list given to you today.

## 2014-04-11 ENCOUNTER — Other Ambulatory Visit: Payer: Self-pay | Admitting: Interventional Cardiology

## 2014-04-13 ENCOUNTER — Other Ambulatory Visit: Payer: Self-pay | Admitting: *Deleted

## 2014-04-13 MED ORDER — APIXABAN 5 MG PO TABS: 5.0000 mg | ORAL_TABLET | Freq: Two times a day (BID) | ORAL | Status: DC

## 2014-05-22 ENCOUNTER — Other Ambulatory Visit: Payer: Self-pay | Admitting: Interventional Cardiology

## 2014-05-31 ENCOUNTER — Encounter: Payer: Self-pay | Admitting: Cardiology

## 2014-06-27 ENCOUNTER — Other Ambulatory Visit: Payer: Self-pay | Admitting: Interventional Cardiology

## 2014-08-08 NOTE — Progress Notes (Signed)
Cardiology Office Note   Date:  08/09/2014   ID:  Steve Jensen, DOB 1940/06/15, MRN [id number]  PCP:  [name], MD  Cardiologist:   [name], MD   Chief Complaint  Patient presents with  . Sleep Apnea  . Obesity  . Hypertension      History of Present Illness: Steve Jensen is a 74 y.o. male with a history of OSA on BiPAP, morbid obesity and HTN who presents today for followup. He tolerates his BiPAP therapy well. He tolerates the nasal mask  and feels the pressure is adequate.  The mask leaks a little but he can easily adjust it.  He dose not sleep supine.   He does not feel rested when he gets up due to getting up so much at night due to his diuretic and takes a nap occasionally during the day. He does not have any problems with nasal congestion.  His mouth does get dry some despite using the chin strap.  He works in the yard for exercise.  He also goes to PT for knee PT with resistance training and the bike and treadmill.  He has lost 14 lbs.       Past Medical History  Diagnosis Date  . Hypertension   . Hyperlipidemia   . Anemia   . Morbid obesity   . Diabetes mellitus type II, controlled     with neuropathy  . Prostate cancer   . GERD (gastroesophageal reflux disease)   . Shingles     on face Nov 2010  . OSA (obstructive sleep apnea)     Past Surgical History  Procedure Laterality Date  . Cardioversion N/A 09/06/2013    Procedure: CARDIOVERSION;  Surgeon: [name], MD;  Location: [facility] ENDOSCOPY;  Service: Cardiovascular;  Laterality: N/A;     Current Outpatient Prescriptions  Medication Sig Dispense Refill  . apixaban (ELIQUIS) 5 MG TABS tablet Take 1 tablet (5 mg total) by mouth 2 (two) times daily. 60 tablet 11  . diltiazem (CARDIZEM CD) 240 MG 24 hr capsule TAKE ONE CAPSULE BY MOUTH DAILY 30 capsule 11  . fish oil-omega-3 fatty acids 1000 MG capsule Take 1,200 mg by mouth 2 (two) times daily.    . furosemide (LASIX) 40 MG tablet  TAKE 1 TABLET BY MOUTH TWICE DAILY 180 tablet 3  . HYDROcodone-acetaminophen (NORCO/VICODIN) 5-325 MG per tablet Take 1-2 tablets by mouth every 4 (four) hours as needed.     Marland Kitchen ibuprofen (ADVIL,MOTRIN) 800 MG tablet Take 800 mg by mouth every 6 (six) hours as needed. prn    . metFORMIN (GLUCOPHAGE) 1000 MG tablet Take 1,000 mg by mouth 2 (two) times daily.  0  . metoprolol succinate (TOPROL-XL) 50 MG 24 hr tablet Take 50 mg by mouth daily.     Marland Kitchen omeprazole (PRILOSEC) 20 MG capsule Take 20 mg by mouth daily.     . potassium chloride SA (K-DUR,KLOR-CON) 20 MEQ tablet Take 1 tablet (20 mEq total) by mouth 2 (two) times daily. 180 tablet 3  . ramipril (ALTACE) 10 MG capsule Take 10 mg by mouth daily.     . TRULICITY 1.73 YO/3.7CH SOPN Inject 1.5 mg as directed once a week.  5  . ZETIA 10 MG tablet Take 10 mg by mouth daily.      No current facility-administered medications for this visit.    Allergies:   Lipitor; Simvastatin; and Zithromax    Social History:  The patient  reports that  he has quit smoking. He does not have any smokeless tobacco history on file. He reports that he does not drink alcohol or use illicit drugs.   Family History:  The patient's family history includes CVA in his brother; Diabetes in his brother, brother, and brother; Heart disease in his brother; Hypertension in his brother, brother, and brother; Prostate cancer in his brother.    ROS:  Please see the history of present illness.   Otherwise, review of systems are positive for none.   All other systems are reviewed and negative.    PHYSICAL EXAM: VS:  BP 130/68 mmHg  Pulse 79  Ht 5' 11.5" (1.816 m)  Wt 280 lb 6.4 oz (127.189 kg)  BMI 38.57 kg/m2  SpO2 93% , BMI Body mass index is 38.57 kg/(m^2). GEN: Well nourished, well developed, in no acute distress HEENT: normal Neck: no JVD, carotid bruits, or masses Cardiac: RRR; no murmurs, rubs, or gallops,no edema  Respiratory:  clear to auscultation bilaterally,  normal work of breathing GI: soft, nontender, nondistended, + BS MS: no deformity or atrophy Skin: warm and dry, no rash Neuro:  Strength and sensation are intact Psych: euthymic mood, full affect   EKG:  EKG is not ordered today.    Recent Labs: 08/11/2013: TSH 2.83 03/09/2014: BUN 15; Creatinine 1.0; Hemoglobin 14.2; Platelets 339.0; Potassium 3.8; Sodium 139    Lipid Panel No results found for: CHOL, TRIG, HDL, CHOLHDL, VLDL, LDLCALC, LDLDIRECT    Wt Readings from Last 3 Encounters:  08/09/14 280 lb 6.4 oz (127.189 kg)  03/09/14 294 lb 12.8 oz (133.72 kg)  02/08/14 293 lb 6.4 oz (133.085 kg)    ASSESSMENT AND PLAN:  1. OSA on BiPAP ASV - his download today showed an AHI of 0.7/hr but only 63% compliance in using more than 4 hours nightly. He is tolerating the device. 2. HTN - well controlled  - continue diltiazem/BB/ramipril  3. Morbid obesity - he has lost 14 pounds through diet and exercise and I have congratulated him and encouraged him to continue.       Current medicines are reviewed at length with the patient today.  The patient does not have concerns regarding medicines.  The following changes have been made:  no change  Labs/ tests ordered today include: None  No orders of the defined types were placed in this encounter.     Disposition:   FU with me in 6 months   Signed, [name], MD  08/09/2014 4:37 PM    [facility] [place], [address], [place]  [address] Phone: [phone]; Fax: [phone]

## 2014-08-09 ENCOUNTER — Ambulatory Visit (INDEPENDENT_AMBULATORY_CARE_PROVIDER_SITE_OTHER): Payer: Medicare Other | Admitting: Cardiology

## 2014-08-09 ENCOUNTER — Encounter: Payer: Self-pay | Admitting: Cardiology

## 2014-08-09 VITALS — BP 130/68 | HR 79 | Ht 71.5 in | Wt 280.4 lb

## 2014-08-09 DIAGNOSIS — G4733 Obstructive sleep apnea (adult) (pediatric): Secondary | ICD-10-CM

## 2014-08-09 NOTE — Patient Instructions (Signed)
Your physician wants you to follow-up in: 6 months with Dr. Radford Pax. You will receive a reminder letter in the mail two months in advance. If you don't receive a letter, please call our office to schedule the follow-up appointment.

## 2014-08-17 ENCOUNTER — Encounter: Payer: Self-pay | Admitting: Cardiology

## 2014-09-20 ENCOUNTER — Encounter: Payer: Self-pay | Admitting: Interventional Cardiology

## 2014-09-21 ENCOUNTER — Telehealth: Payer: Self-pay | Admitting: *Deleted

## 2014-09-21 DIAGNOSIS — E782 Mixed hyperlipidemia: Secondary | ICD-10-CM

## 2014-09-21 NOTE — Telephone Encounter (Signed)
-----   Message from [name], MD sent at 09/20/2014  9:43 PM EDT ----- Elevated TG., Increase fish oil to 2 capsules BID. Recheck fasting lipids in 3 months.

## 2014-09-21 NOTE — Telephone Encounter (Signed)
Informed pt of lab results and new orders to increase fish oil to 2 capsules BID and to recheck lipids in 3 months. Scheduled appt for 12/19/14. Pt verbalized understanding and was in agreement with this plan.

## 2014-11-16 ENCOUNTER — Encounter: Payer: Self-pay | Admitting: Interventional Cardiology

## 2014-12-18 ENCOUNTER — Other Ambulatory Visit (INDEPENDENT_AMBULATORY_CARE_PROVIDER_SITE_OTHER): Payer: Medicare Other | Admitting: *Deleted

## 2014-12-18 DIAGNOSIS — E782 Mixed hyperlipidemia: Secondary | ICD-10-CM | POA: Diagnosis not present

## 2014-12-18 LAB — LIPID PANEL
CHOL/HDL RATIO: 5
Cholesterol: 158 mg/dL (ref 0–200)
HDL: 31 mg/dL — ABNORMAL LOW (ref >39.00)
LDL Cholesterol: 89 mg/dL (ref 0–99)
NonHDL: 127
TRIGLYCERIDES: 188 mg/dL — AB (ref 0.0–149.0)
VLDL: 37.6 mg/dL (ref 0.0–40.0)

## 2014-12-19 ENCOUNTER — Other Ambulatory Visit: Payer: Medicare Other

## 2015-01-11 ENCOUNTER — Encounter: Payer: Self-pay | Admitting: Interventional Cardiology

## 2015-02-07 NOTE — Progress Notes (Signed)
Cardiology Office Note   Date:  02/08/2015   ID:  Steve Jensen, DOB [birth year], MRN [id number]  PCP:  [name], MD    No chief complaint on file.     History of Present Illness: Steve Jensen is a 74 y.o. male with a history of OSA on BiPAP, morbid obesity and HTN who presents today for followup. He tolerates his BiPAP therapy well. He tolerates the nasal mask and feels the pressure is adequate. He does not sleep supine. He still does not feel rested when he gets up due to waking up so much at night but does not know why he wakes up.   He does not have any problems with nasal congestion. His mouth does get dry some despite using the chin strap. He works in the yard for exercise. He has gained back 16lbs due to inactivity this summer with the heat.      Past Medical History  Diagnosis Date  . Hypertension   . Hyperlipidemia   . Anemia   . Morbid obesity   . Diabetes mellitus type II, controlled     with neuropathy  . Prostate cancer   . GERD (gastroesophageal reflux disease)   . Shingles     on face Nov 2010  . OSA (obstructive sleep apnea)     Past Surgical History  Procedure Laterality Date  . Cardioversion N/A 09/06/2013    Procedure: CARDIOVERSION;  Surgeon: [name], MD;  Location: [facility] ENDOSCOPY;  Service: Cardiovascular;  Laterality: N/A;     Current Outpatient Prescriptions  Medication Sig Dispense Refill  . apixaban (ELIQUIS) 5 MG TABS tablet Take 1 tablet (5 mg total) by mouth 2 (two) times daily. 60 tablet 11  . diltiazem (CARDIZEM CD) 240 MG 24 hr capsule TAKE ONE CAPSULE BY MOUTH DAILY 30 capsule 11  . fish oil-omega-3 fatty acids 1000 MG capsule Take 2,400 mg by mouth 2 (two) times daily.    Marland Kitchen HYDROcodone-acetaminophen (NORCO/VICODIN) 5-325 MG per tablet Take 1-2 tablets by mouth every 4 (four) hours as needed.     Marland Kitchen ibuprofen (ADVIL,MOTRIN) 800 MG tablet Take 800 mg by mouth every 6 (six) hours as needed. prn     . metFORMIN (GLUCOPHAGE) 1000 MG tablet Take 1,000 mg by mouth 2 (two) times daily.  0  . metoprolol succinate (TOPROL-XL) 50 MG 24 hr tablet Take 50 mg by mouth daily.     Marland Kitchen omeprazole (PRILOSEC) 20 MG capsule Take 20 mg by mouth daily.     . ramipril (ALTACE) 10 MG capsule Take 10 mg by mouth daily.     . TRULICITY 1.5 EG/3.1DV SOPN Inject 1.5 mg as directed once a week.  5  . ZETIA 10 MG tablet Take 10 mg by mouth daily.     . furosemide (LASIX) 40 MG tablet TAKE 1 TABLET BY MOUTH TWICE DAILY (Patient not taking: Reported on 02/08/2015) 180 tablet 3  . potassium chloride SA (K-DUR,KLOR-CON) 20 MEQ tablet Take 1 tablet (20 mEq total) by mouth 2 (two) times daily. (Patient not taking: Reported on 02/08/2015) 180 tablet 3   No current facility-administered medications for this visit.    Allergies:   Lipitor; Simvastatin; and Zithromax    Social History:  The patient  reports that he has quit smoking. He does not have any smokeless tobacco history on file. He reports that he does not drink alcohol  or use illicit drugs.   Family History:  The patient's family history includes CVA in his brother; Diabetes in his brother, brother, and brother; Heart disease in his brother; Hypertension in his brother, brother, and brother; Prostate cancer in his brother.    ROS:  Please see the history of present illness.   Otherwise, review of systems are positive for none.   All other systems are reviewed and negative.    PHYSICAL EXAM: VS:  BP 140/62 mmHg  Pulse 78  Ht 5' 11.5" (1.816 m)  Wt 296 lb (134.265 kg)  BMI 40.71 kg/m2  SpO2 95% , BMI Body mass index is 40.71 kg/(m^2). GEN: Well nourished, well developed, in no acute distress HEENT: normal Neck: no JVD, carotid bruits, or masses Cardiac: RRR; no murmurs, rubs, or gallops.  Trace edema Respiratory:  clear to auscultation bilaterally, normal work of breathing GI: soft, nontender, nondistended, + BS MS: no deformity or atrophy Skin: warm  and dry, no rash Neuro:  Strength and sensation are intact Psych: euthymic mood, full affect   EKG:  EKG is not ordered today.    Recent Labs: 03/09/2014: BUN 15; Creatinine, Ser 1.0; Hemoglobin 14.2; Platelets 339.0; Potassium 3.8; Sodium 139    Lipid Panel    Component Value Date/Time   CHOL 158 12/18/2014 1039   TRIG 188.0* 12/18/2014 1039   HDL 31.00* 12/18/2014 1039   CHOLHDL 5 12/18/2014 1039   VLDL 37.6 12/18/2014 1039   LDLCALC 89 12/18/2014 1039      Wt Readings from Last 3 Encounters:  02/08/15 296 lb (134.265 kg)  08/09/14 280 lb 6.4 oz (127.189 kg)  03/09/14 294 lb 12.8 oz (133.72 kg)    ASSESSMENT AND PLAN:  1. OSA on BiPAP ASV - his download today showed an AHI of 0.4/hr and 79% compliance in using more than 4 hours nightly. He is tolerating the device.  He is having a problem with sleeping at night.  He says that sleep aides do not help him.  I have recommended that he try Melatonin and consider changing taking his Toprol before bedtime.  He has been complaining of fatigue and has not had a CBC on Eliquis for sometime.  I will check a CBC today.   2. HTN - well controlled  - continue diltiazem/BB/ramipril  3. Morbid obesity - he has gained weight and I encouraged him to get back to exercise.    Current medicines are reviewed at length with the patient today.  The patient does not have concerns regarding medicines.  The following changes have been made:  no change  Labs/ tests ordered today: See above Assessment and Plan No orders of the defined types were placed in this encounter.     Disposition:   FU with me in 1 year  Signed, [name], MD  02/08/2015 3:49 PM    [facility] [place], [place], [place]  [address] Phone: [phone]; Fax: [phone]

## 2015-02-08 ENCOUNTER — Encounter: Payer: Self-pay | Admitting: Cardiology

## 2015-02-08 ENCOUNTER — Ambulatory Visit (INDEPENDENT_AMBULATORY_CARE_PROVIDER_SITE_OTHER): Payer: Medicare Other | Admitting: Cardiology

## 2015-02-08 VITALS — BP 140/62 | HR 78 | Ht 71.5 in | Wt 296.0 lb

## 2015-02-08 DIAGNOSIS — I10 Essential (primary) hypertension: Secondary | ICD-10-CM | POA: Diagnosis not present

## 2015-02-08 DIAGNOSIS — G4733 Obstructive sleep apnea (adult) (pediatric): Secondary | ICD-10-CM | POA: Diagnosis not present

## 2015-02-08 LAB — CBC WITH DIFFERENTIAL/PLATELET
BASOS PCT: 0.3 % (ref 0.0–3.0)
Basophils Absolute: 0 10*3/uL (ref 0.0–0.1)
EOS ABS: 0.2 10*3/uL (ref 0.0–0.7)
EOS PCT: 1.8 % (ref 0.0–5.0)
HCT: 43.5 % (ref 39.0–52.0)
HEMOGLOBIN: 14.3 g/dL (ref 13.0–17.0)
LYMPHS ABS: 2.5 10*3/uL (ref 0.7–4.0)
Lymphocytes Relative: 24.6 % (ref 12.0–46.0)
MCHC: 33 g/dL (ref 30.0–36.0)
MCV: 82.1 fl (ref 78.0–100.0)
Monocytes Absolute: 1.4 10*3/uL — ABNORMAL HIGH (ref 0.1–1.0)
Monocytes Relative: 14.3 % — ABNORMAL HIGH (ref 3.0–12.0)
NEUTROS ABS: 5.9 10*3/uL (ref 1.4–7.7)
NEUTROS PCT: 59 % (ref 43.0–77.0)
PLATELETS: 300 10*3/uL (ref 150.0–400.0)
RBC: 5.3 Mil/uL (ref 4.22–5.81)
RDW: 15.5 % (ref 11.5–15.5)
WBC: 10 10*3/uL (ref 4.0–10.5)

## 2015-02-08 NOTE — Patient Instructions (Addendum)
Medication Instructions:  Your physician recommends that you continue on your current medications as directed. Please refer to the Current Medication list given to you today.   Labwork: TODAY: CBC  Testing/Procedures: None  Follow-Up: Your physician wants you to follow-up in: 1 year with Dr. Radford Pax. You will receive a reminder letter in the mail two months in advance. If you don't receive a letter, please call our office to schedule the follow-up appointment.   Any Other Special Instructions Will Be Listed Below (If Applicable).

## 2015-02-23 ENCOUNTER — Encounter: Payer: Self-pay | Admitting: Cardiology

## 2015-03-19 ENCOUNTER — Ambulatory Visit (INDEPENDENT_AMBULATORY_CARE_PROVIDER_SITE_OTHER): Payer: Medicare Other | Admitting: Interventional Cardiology

## 2015-03-19 ENCOUNTER — Encounter: Payer: Self-pay | Admitting: Interventional Cardiology

## 2015-03-19 VITALS — BP 140/70 | HR 72 | Ht 71.5 in | Wt 300.0 lb

## 2015-03-19 DIAGNOSIS — I10 Essential (primary) hypertension: Secondary | ICD-10-CM | POA: Diagnosis not present

## 2015-03-19 DIAGNOSIS — I5032 Chronic diastolic (congestive) heart failure: Secondary | ICD-10-CM

## 2015-03-19 DIAGNOSIS — I4891 Unspecified atrial fibrillation: Secondary | ICD-10-CM | POA: Diagnosis not present

## 2015-03-19 NOTE — Progress Notes (Signed)
Patient ID: Steve Jensen, male   DOB: 06-27-1940, 73 y.o.   MRN: [id number]     Cardiology Office Note   Date:  03/19/2015   ID:  Steve Jensen, DOB Oct 09, 1940, MRN [id number]  PCP:  Steve Neer, MD    No chief complaint on file. AFib f/u   Wt Readings from Last 3 Encounters:  03/19/15 300 lb (136.079 kg)  02/08/15 296 lb (134.265 kg)  08/09/14 280 lb 6.4 oz (127.189 kg)       History of Present Illness: Steve Jensen is a 74 y.o. male  who has had swelling and SHOB. He was diagnosed with bronchitis and pneumonia in early 2015 . He is using CPAP regularly. Works in the yard for exercise. No regular walking- wil try with cooler weather. No swelling. He had knee surgery in 2/15 and was not told he was in AFib at that time.  Hypertension:  c/o Leg edema more at the end of the day- same as usual.  Denies : Chest pain.  Dizziness.  Orthopnea.  Paroxysmal nocturnal dyspnea.  Palpitations.  Syncope.   Gained weight. No bleeding problems on Eliquis.  OVerall, he feels well.  He thinks he'll get back to more walking now that the weather is cooler.  He reminded me that he has not had atrial fibrillation symptoms in the past. He was asymptomatic when it was diagnosed before his cardioversion. He has not felt anything out of the ordinary.    Past Medical History  Diagnosis Date  . Hypertension   . Hyperlipidemia   . Anemia   . Morbid obesity ([place])   . Diabetes mellitus type II, controlled ([place])     with neuropathy  . Prostate cancer ([place])   . GERD (gastroesophageal reflux disease)   . Shingles     on face Nov 2010  . OSA (obstructive sleep apnea)     Past Surgical History  Procedure Laterality Date  . Cardioversion N/A 09/06/2013    Procedure: CARDIOVERSION;  Surgeon: Steve Booze, MD;  Location: [facility] ENDOSCOPY;  Service: Cardiovascular;  Laterality: N/A;     Current Outpatient Prescriptions  Medication Sig Dispense Refill  . apixaban (ELIQUIS) 5 MG  TABS tablet Take 1 tablet (5 mg total) by mouth 2 (two) times daily. 60 tablet 11  . diltiazem (CARDIZEM CD) 240 MG 24 hr capsule TAKE ONE CAPSULE BY MOUTH DAILY 30 capsule 11  . fish oil-omega-3 fatty acids 1000 MG capsule Take 2,400 mg by mouth 2 (two) times daily.    Marland Kitchen HYDROcodone-acetaminophen (NORCO/VICODIN) 5-325 MG per tablet Take 1-2 tablets by mouth every 4 (four) hours as needed (PAIN).     Marland Kitchen ibuprofen (ADVIL,MOTRIN) 800 MG tablet Take 800 mg by mouth every 6 (six) hours as needed (PAIN). prn    . metFORMIN (GLUCOPHAGE) 1000 MG tablet Take 1,000 mg by mouth 2 (two) times daily.  0  . metoprolol succinate (TOPROL-XL) 50 MG 24 hr tablet Take 50 mg by mouth daily.     Marland Kitchen omeprazole (PRILOSEC) 20 MG capsule Take 20 mg by mouth daily.     . ramipril (ALTACE) 10 MG capsule Take 10 mg by mouth daily.     . TRULICITY 1.5 HM/0.9OB SOPN Inject 1.5 mg as directed once a week.  5  . ZETIA 10 MG tablet Take 10 mg by mouth daily.      No current facility-administered medications for this visit.    Allergies:   Lipitor; Simvastatin; and Zithromax  Social History:  The patient  reports that he has quit smoking. He does not have any smokeless tobacco history on file. He reports that he does not drink alcohol or use illicit drugs.   Family History:  The patient's family history includes CVA in his brother; Diabetes in his brother, brother, and brother; Heart attack in his brother; Heart disease in his brother; Hypertension in his brother, brother, and brother; Prostate cancer in his brother; Stroke in his brother.    ROS:  Please see the history of present illness.   Otherwise, review of systems are positive for .   All other systems are reviewed and negative.    PHYSICAL EXAM: VS:  BP 140/70 mmHg  Pulse 72  Ht 5' 11.5" (1.816 m)  Wt 300 lb (136.079 kg)  BMI 41.26 kg/m2 , BMI Body mass index is 41.26 kg/(m^2). GEN: Well nourished, well developed, in no acute distress HEENT: normal Neck: no  JVD, carotid bruits, or masses Cardiac: RRR; no murmurs, rubs, or gallops,no edema  Respiratory:  clear to auscultation bilaterally, normal work of breathing GI: soft, nontender, nondistended, + BS MS: no deformity or atrophy Skin: warm and dry, no rash Neuro:  Strength and sensation are intact Psych: euthymic mood, full affect   EKG:   The ekg ordered today demonstrates normal ECG   Recent Labs: 02/08/2015: Hemoglobin 14.3; Platelets 300.0   Lipid Panel    Component Value Date/Time   CHOL 158 12/18/2014 1039   TRIG 188.0* 12/18/2014 1039   HDL 31.00* 12/18/2014 1039   CHOLHDL 5 12/18/2014 1039   VLDL 37.6 12/18/2014 1039   LDLCALC 89 12/18/2014 1039     Other studies Reviewed: Additional studies/ records that were reviewed today with results demonstrating: Prior echocardiogram showed normal left ventricular function with a mildly dilated aortic root, 38 mm in 2015.   ASSESSMENT AND PLAN:  1. AFib: maintatining NSR.   2. Diastolic dysfunction/obesity: stable.  Breathing OK.  INcrease exercise. 3. OSA: Continue CPAP. 4. Hyperlipidemia: intolerant of statins.  5. HTN: Controlled.  Lifestyle modifications will also help.    Current medicines are reviewed at length with the patient today.  The patient concerns regarding his medicines were addressed.  The following changes have been made:  No change  Labs/ tests ordered today include:  No orders of the defined types were placed in this encounter.    Recommend 150 minutes/week of aerobic exercise Low fat, low carb, high fiber diet recommended  Disposition:   FU in 1 year   Steve Jensen., MD  03/19/2015 11:17 AM    [facility] [place], [place], [place]  [address] Phone: [phone]; Fax: [phone]

## 2015-03-19 NOTE — Patient Instructions (Signed)
**Note De-identified Steve Jensen Obfuscation** Medication Instructions:  Same-no changes  Labwork: None  Testing/Procedures: None  Follow-Up: Your physician wants you to follow-up in: 1 year. You will receive a reminder letter in the mail two months in advance. If you don't receive a letter, please call our office to schedule the follow-up appointment.      

## 2015-05-11 ENCOUNTER — Other Ambulatory Visit: Payer: Self-pay

## 2015-05-11 NOTE — Telephone Encounter (Signed)
Medication Detail      Disp Refills Start End     apixaban (ELIQUIS) 5 MG TABS tablet 60 tablet 11 04/13/2014     Sig - Route: Take 1 tablet (5 mg total) by mouth 2 (two) times daily. - Oral    E-Prescribing Status: Receipt confirmed by pharmacy (04/13/2014 8:45 AM EST)     Pharmacy    [id number] - [place], [place] - [address]

## 2015-05-15 ENCOUNTER — Other Ambulatory Visit: Payer: Self-pay | Admitting: Interventional Cardiology

## 2015-05-15 MED ORDER — APIXABAN 5 MG PO TABS: 5.0000 mg | ORAL_TABLET | Freq: Two times a day (BID) | ORAL | Status: DC

## 2015-06-26 ENCOUNTER — Other Ambulatory Visit: Payer: Self-pay | Admitting: Interventional Cardiology

## 2015-06-26 MED ORDER — DILTIAZEM HCL ER COATED BEADS 240 MG PO CP24: 240.0000 mg | ORAL_CAPSULE | Freq: Every day | ORAL | Status: DC

## 2016-03-18 NOTE — Progress Notes (Signed)
Patient ID: Steve Jensen, male   DOB: 06-24-1940, 75 y.o.   MRN: [id number]     Cardiology Office Note   Date:  03/19/2016   ID:  Steve Jensen, DOB 12-Jun-1940, MRN [id number]  PCP:  [name], MD    No chief complaint on file. AFib f/u   Wt Readings from Last 3 Encounters:  03/19/16 (!) 142.2 kg (313 lb 6.4 oz)  03/19/15 136.1 kg (300 lb)  02/08/15 134.3 kg (296 lb)       History of Present Illness: Steve Jensen is a 75 y.o. male  who has had swelling and SHOB. He was diagnosed with bronchitis and pneumonia in early 2015 . He is using CPAP regularly. Works in the yard for exercise. No regular walking- wil try with cooler weather. No swelling. He had knee surgery in 2/15 and was not told he was in AFib at that time.  Hypertension:  c/o Leg edema more at the end of the day- same as usual. Denies : Chest pain. Orthopnea. Paroxysmal nocturnal dyspnea. Palpitations. Syncope.   Gained weight. Exercise was limited by cataract surgery followed by bronchitis.  He gained weight at that time.  No bleeding problems on Eliquis.  He has foot pain.  Walking is difficult because of foot pain.  He has an eeliptical but has not tried it due to fear of foot pain.  He reminded me that he has not had atrial fibrillation symptoms in the past. He was asymptomatic when it was diagnosed before his cardioversion. He has not felt anything out of the ordinary.  Three weeks ago, he had higher BP readings along with dizziness.  It resolved after a night of sleep.  After that, BP was better.  Otherwise, BP has been controlled.   He usually gets 140-150/80 range.    Past Medical History:  Diagnosis Date  . Anemia   . Diabetes mellitus type II, controlled ([place])    with neuropathy  . GERD (gastroesophageal reflux disease)   . Hyperlipidemia   . Hypertension   . Morbid obesity ([place])   . OSA (obstructive sleep apnea)   . Prostate cancer ([place])   . Shingles    on face Nov 2010    Past  Surgical History:  Procedure Laterality Date  . CARDIOVERSION N/A 09/06/2013   Procedure: CARDIOVERSION;  Surgeon: [name], MD;  Location: [facility] ENDOSCOPY;  Service: Cardiovascular;  Laterality: N/A;     Current Outpatient Prescriptions  Medication Sig Dispense Refill  . apixaban (ELIQUIS) 5 MG TABS tablet Take 1 tablet (5 mg total) by mouth 2 (two) times daily. 60 tablet 10  . diltiazem (CARDIZEM CD) 240 MG 24 hr capsule Take 1 capsule (240 mg total) by mouth daily. 30 capsule 10  . fish oil-omega-3 fatty acids 1000 MG capsule Take 2,400 mg by mouth 2 (two) times daily.    Marland Kitchen HYDROcodone-acetaminophen (NORCO/VICODIN) 5-325 MG per tablet Take 1-2 tablets by mouth every 4 (four) hours as needed (PAIN).     Marland Kitchen ibuprofen (ADVIL,MOTRIN) 800 MG tablet Take 800 mg by mouth every 6 (six) hours as needed (PAIN). prn    . metFORMIN (GLUCOPHAGE) 1000 MG tablet Take 1,000 mg by mouth 2 (two) times daily.  0  . metoprolol succinate (TOPROL-XL) 50 MG 24 hr tablet Take 50 mg by mouth daily.     Marland Kitchen omeprazole (PRILOSEC) 20 MG capsule Take 20 mg by mouth daily.     . ramipril (ALTACE) 10 MG capsule Take  10 mg by mouth daily.     . TRULICITY 1.5 0000000 SOPN Inject 1.5 mg as directed once a week.  5  . ZETIA 10 MG tablet Take 10 mg by mouth daily.      No current facility-administered medications for this visit.     Allergies:   Lipitor [atorvastatin]; Simvastatin; and Zithromax [azithromycin]    Social History:  The patient  reports that he has quit smoking. He does not have any smokeless tobacco history on file. He reports that he does not drink alcohol or use drugs.   Family History:  The patient's family history includes CVA in his brother; Diabetes in his brother, brother, and brother; Heart attack in his brother; Heart disease in his brother; Hypertension in his brother, brother, and brother; Prostate cancer in his brother; Stroke in his brother.    ROS:  Please see the history of present  illness.   Otherwise, review of systems are positive for .   All other systems are reviewed and negative.    PHYSICAL EXAM: VS:  BP (!) 150/80   Pulse 70   Ht 5' 11.5" (1.816 m)   Wt (!) 142.2 kg (313 lb 6.4 oz)   BMI 43.10 kg/m  , BMI Body mass index is 43.1 kg/m. GEN: Well nourished, well developed, in no acute distress  HEENT: normal  Neck: no JVD, carotid bruits, or masses Cardiac: RRR; no murmurs, rubs, or gallops,no edema  Respiratory:  clear to auscultation bilaterally, normal work of breathing GI: soft, nontender, nondistended, + BS MS: no deformity or atrophy  Skin: warm and dry, no rash Neuro:  Strength and sensation are intact Psych: euthymic mood, full affect   EKG:   The ekg ordered today demonstrates normal ECG   Recent Labs: No results found for requested labs within last 8760 hours.   Lipid Panel    Component Value Date/Time   CHOL 158 12/18/2014 1039   TRIG 188.0 (H) 12/18/2014 1039   HDL 31.00 (L) 12/18/2014 1039   CHOLHDL 5 12/18/2014 1039   VLDL 37.6 12/18/2014 1039   LDLCALC 89 12/18/2014 1039     Other studies Reviewed: Additional studies/ records that were reviewed today with results demonstrating: Prior echocardiogram showed normal left ventricular function with a mildly dilated aortic root, 38 mm in 2015.   ASSESSMENT AND PLAN:  1. AFib: maintatining NSR.  Eliquis for stroke prevention.  Will need to hold it for colonoscopy 2. Diastolic dysfunction/obesity: stable.  Breathing OK.  INcrease exercise.  Has gained more weight in the past year.  3. OSA: Continue CPAP.  As an appt with Dr. Radford Pax.  4. Hyperlipidemia: intolerant of statins.  5. HTN: Suboptimal Control.  Ramipril 10 mg twice daily.  Add amlodipine 5 mg daily.  OK to be on two CCB. Don't want to push HR too low so will add amlodipine instead of increasing Cartia.  Would stay at 5 mg on the amlodipine rather than go to 10 mg.  6. Obese: Gained weight.      Current medicines are  reviewed at length with the patient today.  The patient concerns regarding his medicines were addressed.  The following changes have been made:  No change  Labs/ tests ordered today include:  No orders of the defined types were placed in this encounter.   Recommend 150 minutes/week of aerobic exercise Low fat, low carb, high fiber diet recommended  Disposition:   FU in 1 year   Signed, Larae Grooms, MD  03/19/2016 3:03 PM    [facility] [place], [place], [place]  [address] Phone: [phone]; Fax: [phone]

## 2016-03-19 ENCOUNTER — Encounter (INDEPENDENT_AMBULATORY_CARE_PROVIDER_SITE_OTHER): Payer: Self-pay

## 2016-03-19 ENCOUNTER — Encounter: Payer: Self-pay | Admitting: Interventional Cardiology

## 2016-03-19 ENCOUNTER — Ambulatory Visit (INDEPENDENT_AMBULATORY_CARE_PROVIDER_SITE_OTHER): Payer: Medicare Other | Admitting: Interventional Cardiology

## 2016-03-19 VITALS — BP 150/80 | HR 70 | Ht 71.5 in | Wt 313.4 lb

## 2016-03-19 DIAGNOSIS — I10 Essential (primary) hypertension: Secondary | ICD-10-CM

## 2016-03-19 DIAGNOSIS — I5032 Chronic diastolic (congestive) heart failure: Secondary | ICD-10-CM | POA: Diagnosis not present

## 2016-03-19 DIAGNOSIS — I480 Paroxysmal atrial fibrillation: Secondary | ICD-10-CM | POA: Diagnosis not present

## 2016-03-19 DIAGNOSIS — G4733 Obstructive sleep apnea (adult) (pediatric): Secondary | ICD-10-CM

## 2016-03-19 MED ORDER — AMLODIPINE BESYLATE 5 MG PO TABS: 5.0000 mg | ORAL_TABLET | Freq: Every day | ORAL | 11 refills | Status: DC

## 2016-03-19 NOTE — Patient Instructions (Signed)
**Note De-Identified Javad Salva Obfuscation** Medication Instructions:  Start taking Amlodipine 5 mg daily. All other medications remain the same.  Labwork: None  Testing/Procedures: None  Follow-Up: Your physician wants you to follow-up in: 1 year. You will receive a reminder letter in the mail two months in advance. If you don't receive a letter, please call our office to schedule the follow-up appointment.     If you need a refill on your cardiac medications before your next appointment, please call your pharmacy.

## 2016-03-21 ENCOUNTER — Encounter: Payer: Self-pay | Admitting: Cardiology

## 2016-03-21 ENCOUNTER — Telehealth: Payer: Self-pay

## 2016-03-21 ENCOUNTER — Ambulatory Visit (INDEPENDENT_AMBULATORY_CARE_PROVIDER_SITE_OTHER): Payer: Medicare Other | Admitting: Cardiology

## 2016-03-21 VITALS — BP 146/58 | HR 92 | Ht 71.5 in | Wt 314.0 lb

## 2016-03-21 DIAGNOSIS — G4733 Obstructive sleep apnea (adult) (pediatric): Secondary | ICD-10-CM | POA: Diagnosis not present

## 2016-03-21 DIAGNOSIS — I10 Essential (primary) hypertension: Secondary | ICD-10-CM | POA: Diagnosis not present

## 2016-03-21 NOTE — Progress Notes (Signed)
Cardiology Office Note    Date:  03/21/2016   ID:  VONN BUOL, DOB Sep 02, 1940, MRN [id number]  PCP:  [name], MD  Cardiologist:  Fransico Him, MD   Chief Complaint  Patient presents with  . Sleep Apnea  . Hypertension    History of Present Illness:  Steve Jensen is a 75 y.o. male with a history of OSA on BiPAP, morbid obesity and HTN who presents today for followup. He tolerates his BiPAP therapy well. He tolerates the nasal mask and feels the pressure is adequate.  He does not have any problems with nasal congestion and uses saline nasal spray. His mouth does get dry some despite using the chin strap. He works in the yard for exercise. He still wakes up at night but does not know why he wakes up.  He goes to bed at MN and wakes up at 3am and cant go back to sleep. He gets up and goes back to bed at 4am and sleeps until 9-10am.  He has slept like this for a long time due to getting up to go to the bathroom frequently since his prostate surgery.  He occasionally will take an afternoon nap.    Past Medical History:  Diagnosis Date  . Anemia   . Diabetes mellitus type II, controlled ([place])    with neuropathy  . GERD (gastroesophageal reflux disease)   . Hyperlipidemia   . Hypertension   . Morbid obesity ([place])   . OSA (obstructive sleep apnea)   . Prostate cancer ([place])   . Shingles    on face Nov 2010    Past Surgical History:  Procedure Laterality Date  . CARDIOVERSION N/A 09/06/2013   Procedure: CARDIOVERSION;  Surgeon: [name], MD;  Location: [facility] ENDOSCOPY;  Service: Cardiovascular;  Laterality: N/A;    Current Medications: Outpatient Medications Prior to Visit  Medication Sig Dispense Refill  . amLODipine (NORVASC) 5 MG tablet Take 1 tablet (5 mg total) by mouth daily. 30 tablet 11  . apixaban (ELIQUIS) 5 MG TABS tablet Take 1 tablet (5 mg total) by mouth 2 (two) times daily. 60 tablet 10  . diltiazem (CARDIZEM CD) 240 MG 24 hr capsule Take  1 capsule (240 mg total) by mouth daily. 30 capsule 10  . fish oil-omega-3 fatty acids 1000 MG capsule Take 2,400 mg by mouth 2 (two) times daily.    Marland Kitchen HYDROcodone-acetaminophen (NORCO/VICODIN) 5-325 MG per tablet Take 1-2 tablets by mouth every 4 (four) hours as needed (PAIN).     Marland Kitchen ibuprofen (ADVIL,MOTRIN) 800 MG tablet Take 800 mg by mouth every 6 (six) hours as needed (PAIN). prn    . metFORMIN (GLUCOPHAGE) 1000 MG tablet Take 1,000 mg by mouth 2 (two) times daily.  0  . metoprolol succinate (TOPROL-XL) 50 MG 24 hr tablet Take 50 mg by mouth daily.     Marland Kitchen omeprazole (PRILOSEC) 20 MG capsule Take 20 mg by mouth daily.     . ramipril (ALTACE) 10 MG capsule Take 10 mg by mouth 2 (two) times daily.    . TRULICITY 1.5 0000000 SOPN Inject 1.5 mg as directed once a week.  5  . ZETIA 10 MG tablet Take 10 mg by mouth daily.      No facility-administered medications prior to visit.      Allergies:   Lipitor [atorvastatin]; Simvastatin; and Zithromax [azithromycin]   Social History   Social History  . Marital status: Married    Spouse name: N/A  .  Number of children: N/A  . Years of education: N/A   Social History Main Topics  . Smoking status: Former Research scientist (life sciences)  . Smokeless tobacco: Never Used  . Alcohol use No  . Drug use: No  . Sexual activity: Not Asked   Other Topics Concern  . None   Social History Narrative  . None     Family History:  The patient's family history includes CVA in his brother; Diabetes in his brother, brother, and brother; Heart attack in his brother; Heart disease in his brother; Hypertension in his brother, brother, and brother; Prostate cancer in his brother; Stroke in his brother.   ROS:   Please see the history of present illness.    ROS All other systems reviewed and are negative.  No flowsheet data found.     PHYSICAL EXAM:   VS:  BP (!) 146/58   Pulse 92   Ht 5' 11.5" (1.816 m)   Wt (!) 314 lb (142.4 kg)   SpO2 94%   BMI 43.18 kg/m    GEN:  Well nourished, well developed, in no acute distress  HEENT: normal  Neck: no JVD, carotid bruits, or masses Cardiac: RRR; no murmurs, rubs, or gallops,no edema.  Intact distal pulses bilaterally.  Respiratory:  clear to auscultation bilaterally, normal work of breathing GI: soft, nontender, nondistended, + BS MS: no deformity or atrophy  Skin: warm and dry, no rash Neuro:  Alert and Oriented x 3, Strength and sensation are intact Psych: euthymic mood, full affect  Wt Readings from Last 3 Encounters:  03/21/16 (!) 314 lb (142.4 kg)  03/19/16 (!) 313 lb 6.4 oz (142.2 kg)  03/19/15 300 lb (136.1 kg)      Studies/Labs Reviewed:   EKG:  EKG is not ordered today.    Recent Labs: No results found for requested labs within last 8760 hours.   Lipid Panel    Component Value Date/Time   CHOL 158 12/18/2014 1039   TRIG 188.0 (H) 12/18/2014 1039   HDL 31.00 (L) 12/18/2014 1039   CHOLHDL 5 12/18/2014 1039   VLDL 37.6 12/18/2014 1039   LDLCALC 89 12/18/2014 1039    Additional studies/ records that were reviewed today include:  CPAP download    ASSESSMENT:    1. Obstructive sleep apnea   2. Essential hypertension, benign   3. Morbid obesity ([place])      PLAN:  In order of problems listed above:  OSA - the patient is tolerating PAP therapy well without any problems. The PAP download was reviewed today and showed an AHI of 0.2/hr on 15/11 cm H2O with 98% compliance in using more than 4 hours nightly.  The patient has been using and benefiting from CPAP use and will continue to benefit from therapy.  HTN - BP borderline controlled on current meds.  Continue amlodipine/CCB/BB/ACE I Morbid obesity - I have encouraged him to  cut back on carbs and portions. His exercise is limited due to neuropathy in his feet.       Medication Adjustments/Labs and Tests Ordered: Current medicines are reviewed at length with the patient today.  Concerns regarding medicines are outlined above.   Medication changes, Labs and Tests ordered today are listed in the Patient Instructions below.  There are no Patient Instructions on file for this visit.   Signed, Fransico Him, MD  03/21/2016 1:22 PM    [facility] [place] [address], [place], [place]  [address] Phone: [phone]; Fax: [phone]   

## 2016-03-21 NOTE — Telephone Encounter (Signed)
Pt takes Eliquis for afib with CHADS2 score of 3 (HTN, HF, and DM), no hx of stroke. Ok to hold Eliquis for 24 hours prior to colonoscopy.

## 2016-03-21 NOTE — Telephone Encounter (Signed)
**Note De-Identified Roschelle Calandra Obfuscation** I have faxed this message back to [facility] at [phone]. I did receive confirmation that the fax was successful.

## 2016-03-21 NOTE — Telephone Encounter (Signed)
Agree with Dr. Onnie Graham.

## 2016-03-21 NOTE — Patient Instructions (Signed)

## 2016-03-21 NOTE — Telephone Encounter (Signed)
**Note De-Identified Jung Yurchak Obfuscation** The pt is scheduled to have a colonoscopy on 04/01/16 with [facility].  Please advise on when the pt can stop taking Eliquis prior to procedure.

## 2016-04-01 ENCOUNTER — Other Ambulatory Visit: Payer: Self-pay | Admitting: Gastroenterology

## 2016-04-01 DIAGNOSIS — Z86010 Personal history of colonic polyps: Secondary | ICD-10-CM

## 2016-04-14 ENCOUNTER — Ambulatory Visit
Admission: RE | Admit: 2016-04-14 | Discharge: 2016-04-14 | Disposition: A | Discharge status: Home / Self Care | Payer: Medicare Other | Source: Ambulatory Visit | Attending: Gastroenterology | Admitting: Gastroenterology

## 2016-04-14 DIAGNOSIS — Z86010 Personal history of colonic polyps: Secondary | ICD-10-CM

## 2016-04-23 ENCOUNTER — Encounter: Payer: Self-pay | Admitting: Interventional Cardiology

## 2016-05-20 ENCOUNTER — Other Ambulatory Visit: Payer: Self-pay | Admitting: Interventional Cardiology

## 2016-05-20 ENCOUNTER — Other Ambulatory Visit: Payer: Self-pay

## 2016-05-20 MED ORDER — APIXABAN 5 MG PO TABS: 5.0000 mg | ORAL_TABLET | Freq: Two times a day (BID) | ORAL | 1 refills | Status: DC

## 2016-06-13 ENCOUNTER — Telehealth: Payer: Self-pay | Admitting: Interventional Cardiology

## 2016-06-13 DIAGNOSIS — I5032 Chronic diastolic (congestive) heart failure: Secondary | ICD-10-CM

## 2016-06-13 MED ORDER — FUROSEMIDE 40 MG PO TABS: ORAL_TABLET | ORAL | 3 refills | Status: DC

## 2016-06-13 MED ORDER — POTASSIUM CHLORIDE CRYS ER 20 MEQ PO TBCR: EXTENDED_RELEASE_TABLET | ORAL | 3 refills | Status: DC

## 2016-06-13 NOTE — Telephone Encounter (Signed)
New Message     *STAT* If patient is at the pharmacy, call can be transferred to refill team.   1. Which medications need to be refilled? (please list name of each medication and dose if known) furosemide 40 mg   2. Which pharmacy/location (including street and city if local pharmacy) is medication to be sent to? [facility] in [place]   3. Do they need a 30 day or 90 day supply? [place]

## 2016-06-13 NOTE — Telephone Encounter (Signed)
Prescriptions for furosemide and potassium sent to patient's preferred pharmacy. Patient instructed on how and when to take meds. BMET ordered for 06/20/16. Patient verbalized understanding and is in agreement with this plan.

## 2016-06-13 NOTE — Telephone Encounter (Signed)
This medication is not listed on patients current med list. It was removed at 03/19/15 office visit with a reason of patient preference. Please advise on request. Thanks, MI

## 2016-06-13 NOTE — Telephone Encounter (Signed)
Spoke with the patient and patient states that he wants a refill of his furosemide 40 mg BID. The patient's prescriptions for furosemide and potassium were discontinued at the 03/19/15 office visit since the patient stated that he did not take these medications. The patient states that he has had some leg swelling for the past week or so and would like to have a new prescription for furosemide. The patient also stated that his PCP Dr. Brigitte Pulse increased his amlodipine from 5 mg daily to 10 mg twice daily about 3 weeks ago. He states that his BP has improved with this new dose (05/25/16: 145/68 and 06/04/16: 124/62). The patient denies any SOB or weight gain. The patient states that he would like furosemide for his leg swelling. Please advise.

## 2016-06-13 NOTE — Telephone Encounter (Signed)
OK to call in Furosemide 40 mg BID prn; take KCl 20 mEq daily prn when he takes Lasix.  If he is going to take this for a few days in the next week, plan BMet in a week.

## 2016-06-20 ENCOUNTER — Other Ambulatory Visit: Payer: Medicare Other | Admitting: *Deleted

## 2016-06-20 DIAGNOSIS — I5032 Chronic diastolic (congestive) heart failure: Secondary | ICD-10-CM

## 2016-06-21 LAB — BASIC METABOLIC PANEL
BUN / CREAT RATIO: 13 (ref 10–24)
BUN: 12 mg/dL (ref 8–27)
CALCIUM: 9.5 mg/dL (ref 8.6–10.2)
CO2: 20 mmol/L (ref 18–29)
Chloride: 98 mmol/L (ref 96–106)
Creatinine, Ser: 0.96 mg/dL (ref 0.76–1.27)
GFR calc Af Amer: 90 mL/min/{1.73_m2} (ref >59)
GFR calc non Af Amer: 78 mL/min/{1.73_m2} (ref >59)
Glucose: 251 mg/dL — ABNORMAL HIGH (ref 65–99)
POTASSIUM: 4.2 mmol/L (ref 3.5–5.2)
Sodium: 139 mmol/L (ref 134–144)

## 2016-09-03 ENCOUNTER — Emergency Department (HOSPITAL_COMMUNITY): Payer: Medicare Other

## 2016-09-03 ENCOUNTER — Emergency Department (HOSPITAL_COMMUNITY)
Admission: EM | Admit: 2016-09-03 | Discharge: 2016-09-03 | Disposition: A | Discharge status: Home / Self Care | Payer: Medicare Other | Attending: Emergency Medicine | Admitting: Emergency Medicine

## 2016-09-03 ENCOUNTER — Encounter (HOSPITAL_COMMUNITY): Payer: Self-pay | Admitting: Emergency Medicine

## 2016-09-03 DIAGNOSIS — R935 Abnormal findings on diagnostic imaging of other abdominal regions, including retroperitoneum: Secondary | ICD-10-CM | POA: Diagnosis not present

## 2016-09-03 DIAGNOSIS — Z8546 Personal history of malignant neoplasm of prostate: Secondary | ICD-10-CM | POA: Diagnosis not present

## 2016-09-03 DIAGNOSIS — E1149 Type 2 diabetes mellitus with other diabetic neurological complication: Secondary | ICD-10-CM | POA: Diagnosis not present

## 2016-09-03 DIAGNOSIS — Z23 Encounter for immunization: Secondary | ICD-10-CM | POA: Diagnosis not present

## 2016-09-03 DIAGNOSIS — I110 Hypertensive heart disease with heart failure: Secondary | ICD-10-CM | POA: Insufficient documentation

## 2016-09-03 DIAGNOSIS — Y999 Unspecified external cause status: Secondary | ICD-10-CM | POA: Diagnosis not present

## 2016-09-03 DIAGNOSIS — S2242XA Multiple fractures of ribs, left side, initial encounter for closed fracture: Secondary | ICD-10-CM | POA: Diagnosis not present

## 2016-09-03 DIAGNOSIS — S299XXA Unspecified injury of thorax, initial encounter: Secondary | ICD-10-CM | POA: Diagnosis present

## 2016-09-03 DIAGNOSIS — Y92410 Unspecified street and highway as the place of occurrence of the external cause: Secondary | ICD-10-CM | POA: Diagnosis not present

## 2016-09-03 DIAGNOSIS — T148XXA Other injury of unspecified body region, initial encounter: Secondary | ICD-10-CM

## 2016-09-03 DIAGNOSIS — Z7901 Long term (current) use of anticoagulants: Secondary | ICD-10-CM | POA: Insufficient documentation

## 2016-09-03 DIAGNOSIS — R930 Abnormal findings on diagnostic imaging of skull and head, not elsewhere classified: Secondary | ICD-10-CM | POA: Diagnosis not present

## 2016-09-03 DIAGNOSIS — I5032 Chronic diastolic (congestive) heart failure: Secondary | ICD-10-CM | POA: Diagnosis not present

## 2016-09-03 DIAGNOSIS — Y939 Activity, unspecified: Secondary | ICD-10-CM | POA: Diagnosis not present

## 2016-09-03 DIAGNOSIS — Z7984 Long term (current) use of oral hypoglycemic drugs: Secondary | ICD-10-CM | POA: Diagnosis not present

## 2016-09-03 LAB — CBC WITH DIFFERENTIAL/PLATELET
Basophils Absolute: 0 10*3/uL (ref 0.0–0.1)
Basophils Relative: 0 %
Eosinophils Absolute: 0.1 10*3/uL (ref 0.0–0.7)
Eosinophils Relative: 1 %
HCT: 41.5 % (ref 39.0–52.0)
Hemoglobin: 14.7 g/dL (ref 13.0–17.0)
Lymphocytes Relative: 18 %
Lymphs Abs: 2.7 10*3/uL (ref 0.7–4.0)
MCH: 30 pg (ref 26.0–34.0)
MCHC: 35.4 g/dL (ref 30.0–36.0)
MCV: 84.7 fL (ref 78.0–100.0)
Monocytes Absolute: 1 10*3/uL (ref 0.1–1.0)
Monocytes Relative: 7 %
Neutro Abs: 11 10*3/uL — ABNORMAL HIGH (ref 1.7–7.7)
Neutrophils Relative %: 74 %
Platelets: 317 10*3/uL (ref 150–400)
RBC: 4.9 MIL/uL (ref 4.22–5.81)
RDW: 13.7 % (ref 11.5–15.5)
WBC: 14.8 10*3/uL — ABNORMAL HIGH (ref 4.0–10.5)

## 2016-09-03 LAB — COMPREHENSIVE METABOLIC PANEL
ALBUMIN: 4 g/dL (ref 3.5–5.0)
ALK PHOS: 82 U/L (ref 38–126)
ALT: 44 U/L (ref 17–63)
AST: 49 U/L — AB (ref 15–41)
Anion gap: 14 (ref 5–15)
BILIRUBIN TOTAL: 1.1 mg/dL (ref 0.3–1.2)
BUN: 12 mg/dL (ref 6–20)
CALCIUM: 9.3 mg/dL (ref 8.9–10.3)
CO2: 23 mmol/L (ref 22–32)
CREATININE: 1.24 mg/dL (ref 0.61–1.24)
Chloride: 103 mmol/L (ref 101–111)
GFR calc Af Amer: >60 mL/min (ref >60)
GFR calc non Af Amer: 56 mL/min — ABNORMAL LOW (ref >60)
GLUCOSE: 148 mg/dL — AB (ref 65–99)
POTASSIUM: 3.6 mmol/L (ref 3.5–5.1)
Sodium: 140 mmol/L (ref 135–145)
Total Protein: 6.3 g/dL — ABNORMAL LOW (ref 6.5–8.1)

## 2016-09-03 LAB — PROTIME-INR
INR: 1.13
Prothrombin Time: 14.5 seconds (ref 11.4–15.2)

## 2016-09-03 MED ORDER — IOPAMIDOL (ISOVUE-300) INJECTION 61%
INTRAVENOUS | Status: AC
  Administered 2016-09-03: 100 mL
  Filled 2016-09-03: qty 100

## 2016-09-03 MED ORDER — METHOCARBAMOL 500 MG PO TABS: 500.0000 mg | ORAL_TABLET | Freq: Two times a day (BID) | ORAL | 0 refills | Status: DC

## 2016-09-03 MED ORDER — TETANUS-DIPHTH-ACELL PERTUSSIS 5-2.5-18.5 LF-MCG/0.5 IM SUSP
0.5000 mL | Freq: Once | INTRAMUSCULAR | Status: AC
  Administered 2016-09-03: 0.5 mL via INTRAMUSCULAR
  Filled 2016-09-03: qty 0.5

## 2016-09-03 MED ORDER — HYDROCODONE-ACETAMINOPHEN 5-325 MG PO TABS: 1.0000 | ORAL_TABLET | Freq: Four times a day (QID) | ORAL | 0 refills | Status: DC | PRN

## 2016-09-03 MED ORDER — METHOCARBAMOL 500 MG PO TABS
500.0000 mg | ORAL_TABLET | ORAL | Status: AC
  Administered 2016-09-03: 500 mg via ORAL
  Filled 2016-09-03: qty 1

## 2016-09-03 MED ORDER — MORPHINE SULFATE (PF) 4 MG/ML IV SOLN
4.0000 mg | Freq: Once | INTRAVENOUS | Status: AC
  Administered 2016-09-03: 4 mg via INTRAVENOUS
  Filled 2016-09-03: qty 1

## 2016-09-03 NOTE — Discharge Instructions (Signed)
As discussed, you have 2 with fracture.  She been given a muscle relaxer and pain medication that you can take as needed on a regular basis for the next several days.  Please make an appointment with your primary care physician for follow-up. Your CT scan showed that you do have a slight increase in the known liver nodule recommended by radiology that you have an MRI to further look at this in the near future. You've also been given incentive spirometer that you should use as follows 4-5 deep breaths at least every hour while awake for the next week.

## 2016-09-03 NOTE — ED Notes (Signed)
MD at bedside performing fast

## 2016-09-03 NOTE — ED Notes (Signed)
Pt returned from CT OFF of cardiac monitor. Placed pt back on monitor and obtained vitals.

## 2016-09-03 NOTE — ED Notes (Signed)
Pt verbalized understanding discharge instructions and denies any further needs or questions at this time. VS stable, ambulatory and steady gait.   

## 2016-09-03 NOTE — ED Notes (Signed)
RN in room to pass meds, pt remains at CT
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
## 2016-09-18 ENCOUNTER — Other Ambulatory Visit: Payer: Self-pay | Admitting: Family Medicine

## 2016-09-18 DIAGNOSIS — R160 Hepatomegaly, not elsewhere classified: Secondary | ICD-10-CM

## 2016-09-28 ENCOUNTER — Ambulatory Visit
Admission: RE | Admit: 2016-09-28 | Discharge: 2016-09-28 | Disposition: A | Discharge status: Home / Self Care | Payer: Medicare Other | Source: Ambulatory Visit | Attending: Family Medicine | Admitting: Family Medicine

## 2016-09-28 DIAGNOSIS — R160 Hepatomegaly, not elsewhere classified: Secondary | ICD-10-CM

## 2016-10-02 ENCOUNTER — Encounter: Payer: Self-pay | Admitting: Cardiology

## 2016-10-03 ENCOUNTER — Telehealth: Payer: Self-pay | Admitting: Cardiology

## 2016-10-03 NOTE — Telephone Encounter (Signed)
Patient needs to call his DME

## 2016-10-03 NOTE — Telephone Encounter (Signed)
New Message   Dr. Radford Pax put him on cpap years ago and cpap broke last night - needs a call back as soon as possible. Wants order sent to [facility]

## 2016-10-08 NOTE — Telephone Encounter (Signed)
Spoke to the patient and encouraged him to call [facility]  his DME at [phone] ext. [phone]

## 2016-10-14 ENCOUNTER — Telehealth: Payer: Self-pay | Admitting: Cardiology

## 2016-10-14 NOTE — Telephone Encounter (Signed)
New message     Pt cpap machine is broken and he needs a new one,  His insurance requires that he see Dr Radford Pax before they will replace it

## 2016-10-21 ENCOUNTER — Encounter: Payer: Self-pay | Admitting: Cardiology

## 2016-10-21 NOTE — Telephone Encounter (Signed)
Patient has an appointment for Oct 22 2016 at 11:40 am to see Dr Radford Pax.

## 2016-10-22 ENCOUNTER — Encounter: Payer: Self-pay | Admitting: Cardiology

## 2016-10-22 ENCOUNTER — Ambulatory Visit (INDEPENDENT_AMBULATORY_CARE_PROVIDER_SITE_OTHER): Payer: Medicare Other | Admitting: Cardiology

## 2016-10-22 ENCOUNTER — Encounter (INDEPENDENT_AMBULATORY_CARE_PROVIDER_SITE_OTHER): Payer: Self-pay

## 2016-10-22 VITALS — BP 120/72 | HR 85 | Ht 72.0 in | Wt 301.1 lb

## 2016-10-22 DIAGNOSIS — I10 Essential (primary) hypertension: Secondary | ICD-10-CM

## 2016-10-22 DIAGNOSIS — G4733 Obstructive sleep apnea (adult) (pediatric): Secondary | ICD-10-CM

## 2016-10-22 NOTE — Patient Instructions (Signed)
Medication Instructions:  Your physician recommends that you continue on your current medications as directed. Please refer to the Current Medication list given to you today.   Labwork: None  Testing/Procedures: None  Follow-Up: Your physician wants you to follow-up in: 1 year with Dr. Radford Pax. You will receive a reminder letter in the mail two months in advance. If you don't receive a letter, please call our office to schedule the follow-up appointment.   Any Other Special Instructions Will Be Listed Below (If Applicable). Dr. Radford Pax has ordered for you a new BiPAP at 15/11 cm H2O. Please call Gae Bon, our PAP assistant, directly at [phone] if you do not hear from your medical equipment company in a timely manner.   If you need a refill on your cardiac medications before your next appointment, please call your pharmacy.

## 2016-10-22 NOTE — Progress Notes (Signed)
Cardiology Office Note    Date:  10/22/2016   ID:  DIONEL ARCHEY, DOB 1940-12-15, MRN [id number]  PCP:  [name], MD  Cardiologist:  Fransico Him, MD   Chief Complaint  Patient presents with  . Sleep Apnea  . Hypertension    History of Present Illness:  Steve Jensen is a 76 y.o. male with a history of OSA on BiPAP, morbid obesity and HTN.  He is here today for followup. He tolerates his BiPAP therapy well. He tolerates the nasal mask and feels the pressure is adequate.He denies any significant problems with nasal congestion and uses saline nasal spray. His mouth does get dry some despite using the chin strap but drinks water during the night. For the most part he feels rested in the am but has to get up to go to the bathroom frequently due to his diuretics and so it does disturb his rest so he does not feel as rested as he might if he slept through the night.  He still has to take an afternoon nap once in a while.  Unfortunately his BiPAP device broke and the humidifier no longer works so he needed to see me to get a new device.   Past Medical History:  Diagnosis Date  . Anemia   . Diabetes mellitus type II, controlled ([place])    with neuropathy  . GERD (gastroesophageal reflux disease)   . Hyperlipidemia   . Hypertension   . Morbid obesity ([place])   . OSA (obstructive sleep apnea)    On BiPAP at 15/11cm H2O  . Prostate cancer ([place])   . Shingles    on face Nov 2010    Past Surgical History:  Procedure Laterality Date  . CARDIOVERSION N/A 09/06/2013   Procedure: CARDIOVERSION;  Surgeon: [name], MD;  Location: [facility] ENDOSCOPY;  Service: Cardiovascular;  Laterality: N/A;    Current Medications: Current Meds  Medication Sig  . amLODipine (NORVASC) 5 MG tablet Take 1 tablet (5 mg total) by mouth daily.  Marland Kitchen apixaban (ELIQUIS) 5 MG TABS tablet Take 1 tablet (5 mg total) by mouth 2 (two) times daily.  Marland Kitchen CARTIA XT 240 MG 24 hr capsule TAKE 1 CAPSULE(240 MG)  BY MOUTH DAILY  . fish oil-omega-3 fatty acids 1000 MG capsule Take 2,400 mg by mouth 2 (two) times daily.  . furosemide (LASIX) 40 MG tablet 40 mg twice a day as needed  . ibuprofen (ADVIL,MOTRIN) 800 MG tablet Take 800 mg by mouth every 6 (six) hours as needed (PAIN). prn  . metFORMIN (GLUCOPHAGE) 1000 MG tablet Take 1,000 mg by mouth 2 (two) times daily.  . metoprolol succinate (TOPROL-XL) 50 MG 24 hr tablet Take 50 mg by mouth daily.   Marland Kitchen omeprazole (PRILOSEC) 20 MG capsule Take 20 mg by mouth daily.   . potassium chloride SA (K-DUR,KLOR-CON) 20 MEQ tablet Take 20 mEq daily as needed while taking furosemide  . ramipril (ALTACE) 10 MG capsule Take 10 mg by mouth 2 (two) times daily.  . TRULICITY 1.5 PP/5.0DT SOPN Inject 1.5 mg as directed once a week.  Marland Kitchen ZETIA 10 MG tablet Take 10 mg by mouth daily.     Allergies:   Lipitor [atorvastatin]; Simvastatin; and Zithromax [azithromycin]   Social History   Social History  . Marital status: Married    Spouse name: N/A  . Number of children: N/A  . Years of education: N/A   Social History Main Topics  . Smoking status: Former  Smoker  . Smokeless tobacco: Never Used  . Alcohol use No  . Drug use: No  . Sexual activity: Not Asked   Other Topics Concern  . None   Social History Narrative  . None     Family History:  The patient's family history includes CVA in his brother; Diabetes in his brother, brother, and brother; Heart attack in his brother; Heart disease in his brother; Hypertension in his brother, brother, and brother; Prostate cancer in his brother; Stroke in his brother.   ROS:   Please see the history of present illness.    ROS All other systems reviewed and are negative.  No flowsheet data found.     PHYSICAL EXAM:   VS:  BP 120/72   Pulse 85   Ht 6' (1.829 m)   Wt (!) 301 lb 1.9 oz (136.6 kg)   SpO2 93%   BMI 40.84 kg/m    GEN: Well nourished, well developed, in no acute distress  HEENT: normal  Neck: no  JVD, carotid bruits, or masses Cardiac: RRR; no murmurs, rubs, or gallops.  Trace pedal edema.  Intact distal pulses bilaterally.  Respiratory:  clear to auscultation bilaterally, normal work of breathing GI: soft, nontender, nondistended, + BS MS: no deformity or atrophy  Skin: warm and dry, no rash Neuro:  Alert and Oriented x 3, Strength and sensation are intact Psych: euthymic mood, full affect  Wt Readings from Last 3 Encounters:  10/22/16 (!) 301 lb 1.9 oz (136.6 kg)  09/03/16 (!) 310 lb (140.6 kg)  03/21/16 (!) 314 lb (142.4 kg)      Studies/Labs Reviewed:   EKG:  EKG is not ordered today.    Recent Labs: 09/03/2016: ALT 44; BUN 12; Creatinine, Ser 1.24; Hemoglobin 14.7; Platelets 317; Potassium 3.6; Sodium 140   Lipid Panel    Component Value Date/Time   CHOL 158 12/18/2014 1039   TRIG 188.0 (H) 12/18/2014 1039   HDL 31.00 (L) 12/18/2014 1039   CHOLHDL 5 12/18/2014 1039   VLDL 37.6 12/18/2014 1039   LDLCALC 89 12/18/2014 1039    Additional studies/ records that were reviewed today include:  none    ASSESSMENT:    1. Obstructive sleep apnea   2. Essential hypertension, benign   3. Morbid obesity ([place])      PLAN:  In order of problems listed above:  OSA - the patient is tolerating PAP therapy well without any problems. The PAP download was reviewed today and showed an AHI of 0.3/hr on 15/11 cm H2O with 94% compliance in using more than 4 hours nightly.  The patient has been using and benefiting from CPAP use and will continue to benefit from therapy. I will order him a new device since his BiPAP machine broke.Marland Kitchen HTN - His BP is adequately controlled on current meds.  He will continue on amlodipine 5mg  daily, Cartia XT 240mg  daily, Altace 10mg  BID and Toprol XL 50mg  daily.  Morbid obesity - I have encouraged him to get into a routine exercise program and cut back on carbs and portions.      Medication Adjustments/Labs and Tests Ordered: Current medicines  are reviewed at length with the patient today.  Concerns regarding medicines are outlined above.  Medication changes, Labs and Tests ordered today are listed in the Patient Instructions below.  There are no Patient Instructions on file for this visit.   Signed, Fransico Him, MD  10/22/2016 11:53 AM    [facility]  [address] [place], [place]  [address] Phone: [phone]; Fax: [phone]

## 2016-10-22 NOTE — Addendum Note (Signed)
Addended by: Harland German A on: 10/22/2016 12:05 PM   Modules accepted: Orders

## 2016-10-30 ENCOUNTER — Telehealth: Payer: Self-pay | Admitting: *Deleted

## 2016-10-30 NOTE — Telephone Encounter (Signed)
[facility] ([facility]) did not have this order for the patient. Order has been faxed to [facility] [phone].   

## 2016-10-30 NOTE — Telephone Encounter (Signed)
-----   Message from Theodoro Parma, RN sent at 10/22/2016  1:06 PM EDT ----- Regarding: new BiPAP order New order is in  Thanks!

## 2016-10-30 NOTE — Telephone Encounter (Deleted)
-----   Message from Theodoro Parma, RN sent at 10/22/2016  1:06 PM EDT ----- Regarding: new BiPAP order New order is in  Thanks!

## 2016-11-27 ENCOUNTER — Other Ambulatory Visit: Payer: Self-pay | Admitting: *Deleted

## 2016-11-27 MED ORDER — APIXABAN 5 MG PO TABS: 5.0000 mg | ORAL_TABLET | Freq: Two times a day (BID) | ORAL | 3 refills | Status: DC

## 2016-12-02 NOTE — Telephone Encounter (Signed)
Confirmation from Apolonio Schneiders [facility]) that patient was set up on  BiPAP November 05 2016.

## 2016-12-08 NOTE — Telephone Encounter (Signed)
Patient has a compliance appointment scheduled for Tuesday January 27 2017 at 11:40. Patient has bee notified of this appointment and agrees to it. Patient understands he needs to keep this appointment.

## 2016-12-09 ENCOUNTER — Telehealth: Payer: Self-pay | Admitting: *Deleted

## 2016-12-09 NOTE — Telephone Encounter (Signed)
nformed patient of compliance results and verbalized understanding was indicated. Patient understands his settings will not change. Patient thanked me for the call.

## 2016-12-09 NOTE — Telephone Encounter (Signed)
-----   Message from [name], MD sent at 12/09/2016  5:39 PM EDT ----- Good AHI and compliance.  Continue current PAP settings.

## 2016-12-24 ENCOUNTER — Other Ambulatory Visit: Payer: Self-pay | Admitting: Family Medicine

## 2016-12-26 ENCOUNTER — Other Ambulatory Visit: Payer: Self-pay | Admitting: Family Medicine

## 2016-12-26 DIAGNOSIS — K769 Liver disease, unspecified: Secondary | ICD-10-CM

## 2017-01-08 ENCOUNTER — Ambulatory Visit
Admission: RE | Admit: 2017-01-08 | Discharge: 2017-01-08 | Disposition: A | Discharge status: Home / Self Care | Payer: Medicare Other | Source: Ambulatory Visit | Attending: Family Medicine | Admitting: Family Medicine

## 2017-01-08 DIAGNOSIS — K769 Liver disease, unspecified: Secondary | ICD-10-CM

## 2017-01-08 MED ORDER — GADOBENATE DIMEGLUMINE 529 MG/ML IV SOLN
20.0000 mL | Freq: Once | INTRAVENOUS | Status: AC | PRN
  Administered 2017-01-08: 20 mL via INTRAVENOUS

## 2017-01-27 ENCOUNTER — Ambulatory Visit (INDEPENDENT_AMBULATORY_CARE_PROVIDER_SITE_OTHER): Payer: Medicare Other | Admitting: Cardiology

## 2017-01-27 ENCOUNTER — Encounter (INDEPENDENT_AMBULATORY_CARE_PROVIDER_SITE_OTHER): Payer: Self-pay

## 2017-01-27 ENCOUNTER — Encounter: Payer: Self-pay | Admitting: Cardiology

## 2017-01-27 VITALS — BP 132/62 | HR 86 | Ht 72.0 in | Wt 302.0 lb

## 2017-01-27 DIAGNOSIS — I10 Essential (primary) hypertension: Secondary | ICD-10-CM | POA: Diagnosis not present

## 2017-01-27 DIAGNOSIS — G4733 Obstructive sleep apnea (adult) (pediatric): Secondary | ICD-10-CM

## 2017-01-27 NOTE — Patient Instructions (Signed)
Medication Instructions:  Your provider recommends that you continue on your current medications as directed. Please refer to the Current Medication list given to you today.    Labwork: None  Testing/Procedures: None  Follow-Up: Your provider wants you to follow-up in: 1 year with Dr. Turner. You will receive a reminder letter in the mail two months in advance. If you don't receive a letter, please call our office to schedule the follow-up appointment.    Any Other Special Instructions Will Be Listed Below (If Applicable).     If you need a refill on your cardiac medications before your next appointment, please call your pharmacy.   

## 2017-01-27 NOTE — Progress Notes (Signed)
Cardiology Office Note:    Date:  01/27/2017   ID:  Shirlean Mylar, DOB 02-07-1941, MRN [id number]  PCP:  [name], MD  Cardiologist:  Fransico Him, MD   Referring MD: [name], MD   Chief Complaint  Patient presents with  . Sleep Apnea  . Hypertension    History of Present Illness:    Steve Jensen is a 76 y.o. male with a hx of OSA on BiPAP, morbid obesity and HTN.  He is back today for followup and is doing well.  He tolerates his BiPAP therapy well. He continues to do well with the nasal mask and feels the pressure is adequate.He denies any significant problems with nasal congestion and uses saline nasal spray. His mouth does get dry some despite using the chin strap but drinks water during the night. He continues have problems feeling rested in the am because he has to get up to go to the bathroom frequently due to his diuretics.  He continues to take an afternoon nap once in a while.  At his last OV he needed a new device since his broke which we ordered.  He is now here for a compliance check with his new device.  He loves his new BiPAP and has no problems with it.   Past Medical History:  Diagnosis Date  . Anemia   . Diabetes mellitus type II, controlled ([place])    with neuropathy  . GERD (gastroesophageal reflux disease)   . Hyperlipidemia   . Hypertension   . Morbid obesity ([place])   . OSA (obstructive sleep apnea)    On BiPAP at 15/11cm H2O  . Prostate cancer ([place])   . Shingles    on face Nov 2010    Past Surgical History:  Procedure Laterality Date  . CARDIOVERSION N/A 09/06/2013   Procedure: CARDIOVERSION;  Surgeon: [name], MD;  Location: [facility] ENDOSCOPY;  Service: Cardiovascular;  Laterality: N/A;    Current Medications: Current Meds  Medication Sig  . amLODipine (NORVASC) 5 MG tablet Take 1 tablet (5 mg total) by mouth daily.  Marland Kitchen apixaban (ELIQUIS) 5 MG TABS tablet Take 1 tablet (5 mg total) by mouth 2 (two) times daily.  Marland Kitchen CARTIA  XT 240 MG 24 hr capsule TAKE 1 CAPSULE(240 MG) BY MOUTH DAILY  . fish oil-omega-3 fatty acids 1000 MG capsule Take 2,400 mg by mouth 2 (two) times daily.  . furosemide (LASIX) 40 MG tablet 40 mg twice a day as needed  . ibuprofen (ADVIL,MOTRIN) 800 MG tablet Take 800 mg by mouth every 6 (six) hours as needed (PAIN). prn  . metFORMIN (GLUCOPHAGE) 1000 MG tablet Take 1,000 mg by mouth 2 (two) times daily.  . metoprolol succinate (TOPROL-XL) 50 MG 24 hr tablet Take 50 mg by mouth daily.   Marland Kitchen omeprazole (PRILOSEC) 20 MG capsule Take 20 mg by mouth daily.   . potassium chloride SA (K-DUR,KLOR-CON) 20 MEQ tablet Take 20 mEq daily as needed while taking furosemide  . ramipril (ALTACE) 10 MG capsule Take 10 mg by mouth 2 (two) times daily.  . TRULICITY 1.5 EP/3.2RJ SOPN Inject 1.5 mg as directed once a week.  Marland Kitchen ZETIA 10 MG tablet Take 10 mg by mouth daily.      Allergies:   Lipitor [atorvastatin]; Simvastatin; and Zithromax [azithromycin]   Social History   Social History  . Marital status: Married    Spouse name: N/A  . Number of children: N/A  . Years of education:  N/A   Social History Main Topics  . Smoking status: Former Research scientist (life sciences)  . Smokeless tobacco: Never Used  . Alcohol use No  . Drug use: No  . Sexual activity: Not Asked   Other Topics Concern  . None   Social History Narrative  . None     Family History: The patient's family history includes CVA in his brother; Diabetes in his brother, brother, and brother; Heart attack in his brother; Heart disease in his brother; Hypertension in his brother, brother, and brother; Prostate cancer in his brother; Stroke in his brother.  ROS:   Please see the history of present illness.     All other systems reviewed and are negative.  EKGs/Labs/Other Studies Reviewed:    The following studies were reviewed today: PCAP download  EKG:  EKG is not ordered today.    Recent Labs: 09/03/2016: ALT 44; BUN 12; Creatinine, Ser 1.24; Hemoglobin  14.7; Platelets 317; Potassium 3.6; Sodium 140   Recent Lipid Panel    Component Value Date/Time   CHOL 158 12/18/2014 1039   TRIG 188.0 (H) 12/18/2014 1039   HDL 31.00 (L) 12/18/2014 1039   CHOLHDL 5 12/18/2014 1039   VLDL 37.6 12/18/2014 1039   LDLCALC 89 12/18/2014 1039    Physical Exam:    VS:  BP 132/62   Pulse 86   Ht 6' (1.829 m)   Wt (!) 302 lb (137 kg)   SpO2 95%   BMI 40.96 kg/m     Wt Readings from Last 3 Encounters:  01/27/17 (!) 302 lb (137 kg)  10/22/16 (!) 301 lb 1.9 oz (136.6 kg)  09/03/16 (!) 310 lb (140.6 kg)     GEN:  Well nourished, well developed in no acute distress HEENT: Normal NECK: No JVD; No carotid bruits LYMPHATICS: No lymphadenopathy CARDIAC: RRR, no murmurs, rubs, gallops RESPIRATORY:  Clear to auscultation without rales, wheezing or rhonchi  ABDOMEN: Soft, non-tender, non-distended MUSCULOSKELETAL:  No edema; No deformity  SKIN: Warm and dry NEUROLOGIC:  Alert and oriented x 3 PSYCHIATRIC:  Normal affect   ASSESSMENT:    1. Obstructive sleep apnea   2. Essential hypertension, benign   3. Morbid obesity ([place])    PLAN:    In order of problems listed above:  OSA - the patient is tolerating PAP therapy well without any problems. The PAP download was reviewed today and showed an AHI of 0.2/hr on 15/11 cm H2O with 100% compliance in using more than 4 hours nightly.  The patient has been using and benefiting from CPAP use and will continue to benefit from therapy.  HTN - BP is well controlled on exam today.  He will continue on Cartiz XT 240mg  daily , ramipril 10mg  daily, amlodipine 5mg  daily and Toprol XL 50mg  daily.   3.   Morbid obesity - I have encouraged him to get into a routine exercise program and cut back on carbs and portions.    Medication Adjustments/Labs and Tests Ordered: Current medicines are reviewed at length with the patient today.  Concerns regarding medicines are outlined above.  No orders of the defined types were  placed in this encounter.  No orders of the defined types were placed in this encounter.   Signed, Fransico Him, MD  01/27/2017 12:37 PM    [facility]

## 2017-03-23 ENCOUNTER — Ambulatory Visit (INDEPENDENT_AMBULATORY_CARE_PROVIDER_SITE_OTHER): Payer: Medicare Other | Admitting: Interventional Cardiology

## 2017-03-23 ENCOUNTER — Encounter: Payer: Self-pay | Admitting: Interventional Cardiology

## 2017-03-23 VITALS — BP 130/68 | HR 66 | Ht 72.0 in | Wt 304.2 lb

## 2017-03-23 DIAGNOSIS — I10 Essential (primary) hypertension: Secondary | ICD-10-CM

## 2017-03-23 DIAGNOSIS — I110 Hypertensive heart disease with heart failure: Secondary | ICD-10-CM

## 2017-03-23 DIAGNOSIS — I480 Paroxysmal atrial fibrillation: Secondary | ICD-10-CM | POA: Diagnosis not present

## 2017-03-23 NOTE — Patient Instructions (Signed)
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
## 2017-04-09 ENCOUNTER — Other Ambulatory Visit: Payer: Self-pay | Admitting: Family Medicine

## 2017-04-09 DIAGNOSIS — R160 Hepatomegaly, not elsewhere classified: Secondary | ICD-10-CM

## 2017-04-24 ENCOUNTER — Ambulatory Visit
Admission: RE | Admit: 2017-04-24 | Discharge: 2017-04-24 | Disposition: A | Discharge status: Home / Self Care | Payer: Medicare Other | Source: Ambulatory Visit | Attending: Family Medicine | Admitting: Family Medicine

## 2017-04-24 DIAGNOSIS — R160 Hepatomegaly, not elsewhere classified: Secondary | ICD-10-CM

## 2017-04-24 MED ORDER — GADOBENATE DIMEGLUMINE 529 MG/ML IV SOLN
20.0000 mL | Freq: Once | INTRAVENOUS | Status: AC | PRN
  Administered 2017-04-24: 20 mL via INTRAVENOUS

## 2017-05-25 ENCOUNTER — Other Ambulatory Visit: Payer: Self-pay | Admitting: Interventional Cardiology

## 2017-06-16 ENCOUNTER — Other Ambulatory Visit: Payer: Self-pay | Admitting: Interventional Cardiology

## 2017-08-04 ENCOUNTER — Other Ambulatory Visit: Payer: Self-pay | Admitting: Interventional Cardiology

## 2017-10-07 ENCOUNTER — Other Ambulatory Visit: Payer: Self-pay | Admitting: Family Medicine

## 2017-10-07 DIAGNOSIS — R160 Hepatomegaly, not elsewhere classified: Secondary | ICD-10-CM

## 2017-10-20 ENCOUNTER — Ambulatory Visit
Admission: RE | Admit: 2017-10-20 | Discharge: 2017-10-20 | Disposition: A | Discharge status: Home / Self Care | Payer: Medicare Other | Source: Ambulatory Visit | Attending: Family Medicine | Admitting: Family Medicine

## 2017-10-20 DIAGNOSIS — R160 Hepatomegaly, not elsewhere classified: Secondary | ICD-10-CM

## 2017-10-28 ENCOUNTER — Ambulatory Visit
Admission: RE | Admit: 2017-10-28 | Discharge: 2017-10-28 | Disposition: A | Discharge status: Home / Self Care | Payer: Medicare Other | Source: Ambulatory Visit | Attending: Family Medicine | Admitting: Family Medicine

## 2017-11-09 DIAGNOSIS — M25512 Pain in left shoulder: Secondary | ICD-10-CM | POA: Insufficient documentation

## 2017-11-13 ENCOUNTER — Other Ambulatory Visit: Payer: Self-pay | Admitting: Cardiology

## 2017-11-16 ENCOUNTER — Other Ambulatory Visit: Payer: Self-pay | Admitting: Interventional Cardiology

## 2017-11-16 NOTE — Telephone Encounter (Signed)
Pt is a 77 yr old male who last saw Dr Irish Lack on 03/23/17.Weight at that time was 138Kg. Last noted SCr was 1.24 08/2016. With last weight and pts age, will refill his Eliquis 5mg  BID.

## 2017-11-22 ENCOUNTER — Other Ambulatory Visit: Payer: Self-pay | Admitting: Interventional Cardiology

## 2017-11-25 ENCOUNTER — Other Ambulatory Visit: Payer: Self-pay | Admitting: Family Medicine

## 2017-11-25 DIAGNOSIS — R160 Hepatomegaly, not elsewhere classified: Secondary | ICD-10-CM

## 2017-12-09 ENCOUNTER — Other Ambulatory Visit: Payer: Medicare Other

## 2017-12-10 ENCOUNTER — Ambulatory Visit
Admission: RE | Admit: 2017-12-10 | Discharge: 2017-12-10 | Disposition: A | Discharge status: Home / Self Care | Payer: Medicare Other | Source: Ambulatory Visit | Attending: Family Medicine | Admitting: Family Medicine

## 2017-12-10 DIAGNOSIS — R160 Hepatomegaly, not elsewhere classified: Secondary | ICD-10-CM

## 2017-12-10 MED ORDER — IOPAMIDOL (ISOVUE-300) INJECTION 61%
125.0000 mL | Freq: Once | INTRAVENOUS | Status: AC | PRN
  Administered 2017-12-10: 125 mL via INTRAVENOUS

## 2017-12-15 ENCOUNTER — Other Ambulatory Visit (HOSPITAL_COMMUNITY): Payer: Self-pay | Admitting: Family Medicine

## 2017-12-15 DIAGNOSIS — R160 Hepatomegaly, not elsewhere classified: Secondary | ICD-10-CM

## 2017-12-21 ENCOUNTER — Other Ambulatory Visit: Payer: Self-pay | Admitting: Radiology

## 2017-12-22 ENCOUNTER — Ambulatory Visit (HOSPITAL_COMMUNITY)
Admission: RE | Admit: 2017-12-22 | Discharge: 2017-12-22 | Disposition: A | Discharge status: Home / Self Care | Payer: Medicare Other | Source: Ambulatory Visit | Attending: Family Medicine | Admitting: Family Medicine

## 2017-12-22 ENCOUNTER — Encounter (HOSPITAL_COMMUNITY): Payer: Self-pay

## 2017-12-22 DIAGNOSIS — Z7901 Long term (current) use of anticoagulants: Secondary | ICD-10-CM | POA: Diagnosis not present

## 2017-12-22 DIAGNOSIS — C220 Liver cell carcinoma: Secondary | ICD-10-CM | POA: Diagnosis not present

## 2017-12-22 DIAGNOSIS — Z8546 Personal history of malignant neoplasm of prostate: Secondary | ICD-10-CM | POA: Insufficient documentation

## 2017-12-22 DIAGNOSIS — Z87891 Personal history of nicotine dependence: Secondary | ICD-10-CM | POA: Diagnosis not present

## 2017-12-22 DIAGNOSIS — R160 Hepatomegaly, not elsewhere classified: Secondary | ICD-10-CM

## 2017-12-22 DIAGNOSIS — Z8042 Family history of malignant neoplasm of prostate: Secondary | ICD-10-CM | POA: Insufficient documentation

## 2017-12-22 DIAGNOSIS — G4733 Obstructive sleep apnea (adult) (pediatric): Secondary | ICD-10-CM | POA: Diagnosis not present

## 2017-12-22 DIAGNOSIS — Z6841 Body Mass Index (BMI) 40.0 and over, adult: Secondary | ICD-10-CM | POA: Diagnosis not present

## 2017-12-22 DIAGNOSIS — Z823 Family history of stroke: Secondary | ICD-10-CM | POA: Diagnosis not present

## 2017-12-22 DIAGNOSIS — Z833 Family history of diabetes mellitus: Secondary | ICD-10-CM | POA: Insufficient documentation

## 2017-12-22 DIAGNOSIS — E119 Type 2 diabetes mellitus without complications: Secondary | ICD-10-CM | POA: Diagnosis not present

## 2017-12-22 DIAGNOSIS — E785 Hyperlipidemia, unspecified: Secondary | ICD-10-CM | POA: Diagnosis not present

## 2017-12-22 DIAGNOSIS — Z79899 Other long term (current) drug therapy: Secondary | ICD-10-CM | POA: Insufficient documentation

## 2017-12-22 DIAGNOSIS — K219 Gastro-esophageal reflux disease without esophagitis: Secondary | ICD-10-CM | POA: Insufficient documentation

## 2017-12-22 DIAGNOSIS — Z8249 Family history of ischemic heart disease and other diseases of the circulatory system: Secondary | ICD-10-CM | POA: Insufficient documentation

## 2017-12-22 DIAGNOSIS — I10 Essential (primary) hypertension: Secondary | ICD-10-CM | POA: Insufficient documentation

## 2017-12-22 DIAGNOSIS — Z7984 Long term (current) use of oral hypoglycemic drugs: Secondary | ICD-10-CM | POA: Diagnosis not present

## 2017-12-22 DIAGNOSIS — Z881 Allergy status to other antibiotic agents status: Secondary | ICD-10-CM | POA: Insufficient documentation

## 2017-12-22 LAB — CBC
HCT: 44 % (ref 39.0–52.0)
Hemoglobin: 15.1 g/dL (ref 13.0–17.0)
MCH: 30.1 pg (ref 26.0–34.0)
MCHC: 34.3 g/dL (ref 30.0–36.0)
MCV: 87.6 fL (ref 78.0–100.0)
Platelets: 274 K/uL (ref 150–400)
RBC: 5.02 MIL/uL (ref 4.22–5.81)
RDW: 12.8 % (ref 11.5–15.5)
WBC: 10.3 K/uL (ref 4.0–10.5)

## 2017-12-22 LAB — GLUCOSE, CAPILLARY
Glucose-Capillary: 148 mg/dL — ABNORMAL HIGH (ref 70–99)
Glucose-Capillary: 146 mg/dL — ABNORMAL HIGH (ref 70–99)

## 2017-12-22 LAB — PROTIME-INR
INR: 0.99
Prothrombin Time: 13 s (ref 11.4–15.2)

## 2017-12-22 MED ORDER — LIDOCAINE HCL (PF) 1 % IJ SOLN
INTRAMUSCULAR | Status: AC
  Filled 2017-12-22: qty 30

## 2017-12-22 MED ORDER — FENTANYL CITRATE (PF) 100 MCG/2ML IJ SOLN
INTRAMUSCULAR | Status: AC
  Filled 2017-12-22: qty 2

## 2017-12-22 MED ORDER — GELATIN ABSORBABLE 12-7 MM EX MISC
CUTANEOUS | Status: AC
  Filled 2017-12-22: qty 1

## 2017-12-22 MED ORDER — SODIUM CHLORIDE 0.9 % IV SOLN
INTRAVENOUS | Status: AC | PRN
  Administered 2017-12-22: 10 mL/h via INTRAVENOUS

## 2017-12-22 MED ORDER — SODIUM CHLORIDE 0.9 % IV SOLN: INTRAVENOUS | Status: DC

## 2017-12-22 MED ORDER — MIDAZOLAM HCL 2 MG/2ML IJ SOLN
INTRAMUSCULAR | Status: AC
  Filled 2017-12-22: qty 2

## 2017-12-22 MED ORDER — MIDAZOLAM HCL 2 MG/2ML IJ SOLN
INTRAMUSCULAR | Status: AC | PRN
  Administered 2017-12-22 (×2): 1 mg via INTRAVENOUS
  Administered 2017-12-22 (×2): 0.5 mg via INTRAVENOUS

## 2017-12-22 MED ORDER — FENTANYL CITRATE (PF) 100 MCG/2ML IJ SOLN
INTRAMUSCULAR | Status: AC | PRN
  Administered 2017-12-22 (×4): 25 ug via INTRAVENOUS

## 2017-12-22 NOTE — Procedures (Signed)
R lobe liver lesion Bx 18 g times three EBL 0 Comp 0 
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
## 2017-12-22 NOTE — Discharge Instructions (Addendum)
Liver Biopsy, Care After °Refer to this sheet in the next few weeks. These instructions provide you with information on caring for yourself after your procedure. Your health care provider may also give you more specific instructions. Your treatment has been planned according to current medical practices, but problems sometimes occur. Call your health care provider if you have any problems or questions after your procedure. °What can I expect after the procedure? °After your procedure, it is typical to have the following: °· A small amount of discomfort in the area where the biopsy was done and in the right shoulder or shoulder blade. °· A small amount of bruising around the area where the biopsy was done and on the skin over the liver. °· Sleepiness and fatigue for the rest of the day. ° °Follow these instructions at home: °· Rest at home for 1-2 days or as directed by your health care provider. °· Have a friend or family member stay with you for at least 24 hours. °· Because of the medicines used during the procedure, you should not do the following things in the first 24 hours: °? Drive. °? Use machinery. °? Be responsible for the care of other people. °? Sign legal documents. °? Take a bath or shower. °· There are many different ways to close and cover an incision, including stitches, skin glue, and adhesive strips. Follow your health care provider's instructions on: °? Incision care. °? Bandage (dressing) changes and removal. °? Incision closure removal. °· Do not drink alcohol in the first week. °· Do not lift more than 5 pounds or play contact sports for 2 weeks after this test. °· Take medicines only as directed by your health care provider. Do not take medicine containing aspirin or non-steroidal anti-inflammatory medicines such as ibuprofen for 1 week after this test. °· It is your responsibility to get your test results. °Contact a health care provider if: °· You have increased bleeding from an incision  that results in more than a small spot of blood. °· You have redness, swelling, or increasing pain in any incisions. °· You notice a discharge or a bad smell coming from any of your incisions. °· You have a fever or chills. °Get help right away if: °· You develop swelling, bloating, or pain in your abdomen. °· You become dizzy or faint. °· You develop a rash. °· You are nauseous or vomit. °· You have difficulty breathing, feel short of breath, or feel faint. °· You develop chest pain. °· You have problems with your speech or vision. °· You have trouble balancing or moving your arms or legs. °This information is not intended to replace advice given to you by your health care provider. Make sure you discuss any questions you have with your health care provider. °Document Released: 11/29/2004 Document Revised: 10/18/2015 Document Reviewed: 07/08/2013 °Elsevier Interactive Patient Education © 2018 [employer] °Moderate Conscious Sedation, Adult, Care After °These instructions provide you with information about caring for yourself after your procedure. Your health care provider may also give you more specific instructions. Your treatment has been planned according to current medical practices, but problems sometimes occur. Call your health care provider if you have any problems or questions after your procedure. °What can I expect after the procedure? °After your procedure, it is common: °· To feel sleepy for several hours. °· To feel clumsy and have poor balance for several hours. °· To have poor judgment for several hours. °· To vomit if you eat   too soon. ° °Follow these instructions at home: °For at least 24 hours after the procedure: ° °· Do not: °? Participate in activities where you could fall or become injured. °? Drive. °? Use heavy machinery. °? Drink alcohol. °? Take sleeping pills or medicines that cause drowsiness. °? Make important decisions or sign legal documents. °? Take care of children on your  own. °· Rest. °Eating and drinking °· Follow the diet recommended by your health care provider. °· If you vomit: °? Drink water, juice, or soup when you can drink without vomiting. °? Make sure you have little or no nausea before eating solid foods. °General instructions °· Have a responsible adult stay with you until you are awake and alert. °· Take over-the-counter and prescription medicines only as told by your health care provider. °· If you smoke, do not smoke without supervision. °· Keep all follow-up visits as told by your health care provider. This is important. °Contact a health care provider if: °· You keep feeling nauseous or you keep vomiting. °· You feel light-headed. °· You develop a rash. °· You have a fever. °Get help right away if: °· You have trouble breathing. °This information is not intended to replace advice given to you by your health care provider. Make sure you discuss any questions you have with your health care provider. °Document Released: 03/02/2013 Document Revised: 10/15/2015 Document Reviewed: 09/01/2015 °Elsevier Interactive Patient Education © 2018 [employer] ° °

## 2017-12-24 ENCOUNTER — Encounter: Payer: Self-pay | Admitting: Hematology

## 2017-12-24 ENCOUNTER — Telehealth: Payer: Self-pay | Admitting: Hematology

## 2017-12-24 NOTE — Telephone Encounter (Signed)
Stat referral received from Dr. Jackie Plum office for dx of hepatocellular carcinoma. Pt has been cld and scheduled to see Dr. Burr Medico on 8/7 at 230pm. Pt aware to arrive 30 minutes early. Letter mailed.

## 2017-12-25 NOTE — Progress Notes (Signed)
Called and spoke with Steve Jensen to introduce myself and to explain the role of nurse navigator.  Provided my contact information for concerns or questions.
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
## 2017-12-30 ENCOUNTER — Inpatient Hospital Stay: Payer: Medicare Other

## 2017-12-30 ENCOUNTER — Telehealth: Payer: Self-pay | Admitting: Hematology

## 2017-12-30 ENCOUNTER — Encounter: Payer: Self-pay | Admitting: Hematology

## 2017-12-30 ENCOUNTER — Inpatient Hospital Stay: Payer: Medicare Other | Attending: Hematology | Admitting: Hematology

## 2017-12-30 DIAGNOSIS — I10 Essential (primary) hypertension: Secondary | ICD-10-CM | POA: Diagnosis not present

## 2017-12-30 DIAGNOSIS — K7460 Unspecified cirrhosis of liver: Secondary | ICD-10-CM | POA: Insufficient documentation

## 2017-12-30 DIAGNOSIS — Z8546 Personal history of malignant neoplasm of prostate: Secondary | ICD-10-CM | POA: Insufficient documentation

## 2017-12-30 DIAGNOSIS — C220 Liver cell carcinoma: Secondary | ICD-10-CM

## 2017-12-30 DIAGNOSIS — I5030 Unspecified diastolic (congestive) heart failure: Secondary | ICD-10-CM | POA: Insufficient documentation

## 2017-12-30 DIAGNOSIS — I4891 Unspecified atrial fibrillation: Secondary | ICD-10-CM | POA: Diagnosis not present

## 2017-12-30 DIAGNOSIS — Z8052 Family history of malignant neoplasm of bladder: Secondary | ICD-10-CM | POA: Diagnosis not present

## 2017-12-30 DIAGNOSIS — Z8042 Family history of malignant neoplasm of prostate: Secondary | ICD-10-CM

## 2017-12-30 DIAGNOSIS — D490 Neoplasm of unspecified behavior of digestive system: Secondary | ICD-10-CM

## 2017-12-30 DIAGNOSIS — E119 Type 2 diabetes mellitus without complications: Secondary | ICD-10-CM | POA: Insufficient documentation

## 2017-12-30 DIAGNOSIS — S46002A Unspecified injury of muscle(s) and tendon(s) of the rotator cuff of left shoulder, initial encounter: Secondary | ICD-10-CM | POA: Insufficient documentation

## 2017-12-30 LAB — CMP (CANCER CENTER ONLY)
ALBUMIN: 3.9 g/dL (ref 3.5–5.0)
ALK PHOS: 105 U/L (ref 38–126)
ALT: 59 U/L — AB (ref 0–44)
ANION GAP: 13 (ref 5–15)
AST: 44 U/L — ABNORMAL HIGH (ref 15–41)
BILIRUBIN TOTAL: 0.5 mg/dL (ref 0.3–1.2)
BUN: 11 mg/dL (ref 8–23)
CALCIUM: 9.8 mg/dL (ref 8.9–10.3)
CO2: 27 mmol/L (ref 22–32)
CREATININE: 1.08 mg/dL (ref 0.61–1.24)
Chloride: 100 mmol/L (ref 98–111)
GFR, Estimated: >60 mL/min (ref >60)
GLUCOSE: 126 mg/dL — AB (ref 70–99)
Potassium: 3.8 mmol/L (ref 3.5–5.1)
SODIUM: 140 mmol/L (ref 135–145)
TOTAL PROTEIN: 7.3 g/dL (ref 6.5–8.1)

## 2017-12-30 LAB — CBC WITH DIFFERENTIAL (CANCER CENTER ONLY)
BASOS ABS: 0.1 10*3/uL (ref 0.0–0.1)
BASOS PCT: 1 %
EOS ABS: 0.1 10*3/uL (ref 0.0–0.5)
Eosinophils Relative: 1 %
HCT: 44.7 % (ref 38.4–49.9)
Hemoglobin: 15.2 g/dL (ref 13.0–17.1)
Lymphocytes Relative: 26 %
Lymphs Abs: 2.8 10*3/uL (ref 0.9–3.3)
MCH: 30.1 pg (ref 27.2–33.4)
MCHC: 34.1 g/dL (ref 32.0–36.0)
MCV: 88.3 fL (ref 79.3–98.0)
MONO ABS: 1.2 10*3/uL — AB (ref 0.1–0.9)
MONOS PCT: 11 %
Neutro Abs: 6.7 10*3/uL — ABNORMAL HIGH (ref 1.5–6.5)
Neutrophils Relative %: 61 %
PLATELETS: 343 10*3/uL (ref 140–400)
RBC: 5.06 MIL/uL (ref 4.20–5.82)
RDW: 13.3 % (ref 11.0–14.6)
WBC Count: 10.9 10*3/uL — ABNORMAL HIGH (ref 4.0–10.3)

## 2017-12-30 LAB — PROTIME-INR
INR: 1.08
Prothrombin Time: 14 seconds (ref 11.4–15.2)

## 2017-12-30 MED ORDER — HYDROMORPHONE HCL 2 MG PO TABS: 2.0000 mg | ORAL_TABLET | Freq: Once | ORAL | 0 refills | Status: AC

## 2017-12-30 NOTE — Telephone Encounter (Signed)
Scheduled appt per 8/7 los - gave patient AVS and calender per los.

## 2017-12-31 ENCOUNTER — Telehealth: Payer: Self-pay

## 2017-12-31 LAB — HEPATITIS B SURFACE ANTIGEN: HEP B S AG: NEGATIVE

## 2017-12-31 LAB — HEPATITIS C ANTIBODY: HCV Ab: <0.1 s/co ratio (ref 0.0–0.9)

## 2017-12-31 LAB — AFP TUMOR MARKER: AFP, Serum, Tumor Marker: 1.2 ng/mL (ref 0.0–8.3)

## 2017-12-31 NOTE — Telephone Encounter (Signed)
Spoke with patient per Dr. Burr Medico about Hepatitis results, both negative, liver enzymes slightly elevated Dr. Burr Medico has no concerns.  Patient verbalized an understanding.

## 2017-12-31 NOTE — Telephone Encounter (Signed)
-----   Message from Truitt Merle, MD sent at 12/31/2017  8:08 AM EDT ----- Please let pt know the lab results, HBV (-), HCV(-), slightly elevated liver enzymes, no big concerns. Thanks   Truitt Merle  12/31/2017

## 2018-01-04 NOTE — Progress Notes (Signed)
Called patient to see if his CT and MRI had been scheduled. He will call GI back today to schedule. I confirmed that his apt with Dr. Barry Dienes on 01/14/18 and that the scans needed to be done prior to this appointment. Patient understands that he will need to communicate with [facility] if scans are not scheduled prior to his appointment with surgeon. Patient voiced understanding. I encouraged him to call me with questions or concerns.

## 2018-01-06 NOTE — Progress Notes (Signed)
Pt scheduled to see Dr. Barry Dienes - Surgeon - on 01/14/18 @ 2 PM.

## 2018-01-12 ENCOUNTER — Ambulatory Visit
Admission: RE | Admit: 2018-01-12 | Discharge: 2018-01-12 | Disposition: A | Discharge status: Home / Self Care | Payer: Medicare Other | Source: Ambulatory Visit | Attending: Hematology | Admitting: Hematology

## 2018-01-12 DIAGNOSIS — D490 Neoplasm of unspecified behavior of digestive system: Secondary | ICD-10-CM

## 2018-01-13 ENCOUNTER — Ambulatory Visit
Admission: RE | Admit: 2018-01-13 | Discharge: 2018-01-13 | Disposition: A | Discharge status: Home / Self Care | Payer: Medicare Other | Source: Ambulatory Visit | Attending: Hematology | Admitting: Hematology

## 2018-01-13 DIAGNOSIS — D490 Neoplasm of unspecified behavior of digestive system: Secondary | ICD-10-CM

## 2018-01-13 MED ORDER — GADOBENATE DIMEGLUMINE 529 MG/ML IV SOLN
20.0000 mL | Freq: Once | INTRAVENOUS | Status: AC | PRN
  Administered 2018-01-13: 20 mL via INTRAVENOUS

## 2018-01-18 ENCOUNTER — Telehealth: Payer: Self-pay

## 2018-01-18 NOTE — Telephone Encounter (Signed)
-----   Message from Truitt Merle, MD sent at 01/17/2018  9:41 PM EDT ----- Please let pt know his CT results, no concerns, thanks   Truitt Merle  01/17/2018

## 2018-01-18 NOTE — Telephone Encounter (Signed)
Spoke with patient per Dr. Burr Medico CT scan results are normal, no concerns, patient verbalized an understanding.

## 2018-01-20 ENCOUNTER — Other Ambulatory Visit: Payer: Self-pay | Admitting: General Surgery

## 2018-01-20 DIAGNOSIS — C220 Liver cell carcinoma: Secondary | ICD-10-CM

## 2018-01-26 ENCOUNTER — Ambulatory Visit
Admission: RE | Admit: 2018-01-26 | Discharge: 2018-01-26 | Disposition: A | Discharge status: Home / Self Care | Payer: Medicare Other | Source: Ambulatory Visit | Attending: General Surgery | Admitting: General Surgery

## 2018-01-26 DIAGNOSIS — C220 Liver cell carcinoma: Secondary | ICD-10-CM

## 2018-01-26 HISTORY — PX: IR RADIOLOGIST EVAL & MGMT: IMG5224
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
## 2018-01-27 ENCOUNTER — Telehealth: Payer: Self-pay | Admitting: *Deleted

## 2018-01-27 NOTE — Telephone Encounter (Signed)
I will route this recommendation to the requesting party via Epic fax function and remove from pre-op pool.  Please call with questions.  [place], [place] 01/27/2018, 4:02 PM

## 2018-01-27 NOTE — Telephone Encounter (Signed)
Patient with diagnosis of atrial fibrillation on Eliquis for anticoagulation.    Procedure: embolization of liver tumor Date of procedure: TBD  CHADS2-VASc score of  5 (CHF, HTN, AGE, DM2, , AGE,   CrCl 115.8 Platelet count 343  Per office protocol, patient can hold Eliquis for 2 days prior to procedure.

## 2018-01-27 NOTE — Telephone Encounter (Signed)
   [facility] Pre-operative Risk Assessment    Request for surgical clearance:  1. What type of surgery is being performed? BLAND EMBOLIZATION OF LIVER TUMOR   2. When is this surgery scheduled? TBD   3. What type of clearance is required (medical clearance vs. Pharmacy clearance to hold med vs. Both)? MEDICATION  4. Are there any medications that need to be held prior to surgery and how long? HOLD ELIQUIS FOR 2 DAYS BEFORE PROCEDURE   5. Practice name and name of physician performing surgery?  [facility] INTER[facility]AL [facility]OLOG[facility]    6. What is your office phone number [phone]    7.   What is your office fax number [phone]  8.   Anesthesia type (None, local, MAC, general) ? NOT SPECIFIED   Nuala Alpha 01/27/2018, 10:45 AM  _________________________________________________________________   (provider comments below)

## 2018-02-02 ENCOUNTER — Other Ambulatory Visit (HOSPITAL_COMMUNITY): Payer: Self-pay | Admitting: Interventional Radiology

## 2018-02-02 DIAGNOSIS — C220 Liver cell carcinoma: Secondary | ICD-10-CM

## 2018-02-12 ENCOUNTER — Other Ambulatory Visit: Payer: Self-pay | Admitting: Radiology

## 2018-02-15 ENCOUNTER — Other Ambulatory Visit: Payer: Self-pay

## 2018-02-15 ENCOUNTER — Observation Stay (HOSPITAL_COMMUNITY)
Admission: RE | Admit: 2018-02-15 | Discharge: 2018-02-16 | Disposition: A | Discharge status: Home / Self Care | Payer: Medicare Other | Source: Ambulatory Visit | Attending: Interventional Radiology | Admitting: Interventional Radiology

## 2018-02-15 ENCOUNTER — Encounter (HOSPITAL_COMMUNITY): Payer: Self-pay

## 2018-02-15 ENCOUNTER — Ambulatory Visit (HOSPITAL_COMMUNITY)
Admission: RE | Admit: 2018-02-15 | Discharge: 2018-02-15 | Disposition: A | Discharge status: Home / Self Care | Payer: Medicare Other | Source: Ambulatory Visit | Attending: Interventional Radiology | Admitting: Interventional Radiology

## 2018-02-15 VITALS — BP 134/80 | HR 82 | Temp 98.3°F | Resp 23 | Ht 71.0 in | Wt 297.0 lb

## 2018-02-15 DIAGNOSIS — Z8042 Family history of malignant neoplasm of prostate: Secondary | ICD-10-CM | POA: Insufficient documentation

## 2018-02-15 DIAGNOSIS — E1140 Type 2 diabetes mellitus with diabetic neuropathy, unspecified: Secondary | ICD-10-CM | POA: Insufficient documentation

## 2018-02-15 DIAGNOSIS — Z8249 Family history of ischemic heart disease and other diseases of the circulatory system: Secondary | ICD-10-CM | POA: Insufficient documentation

## 2018-02-15 DIAGNOSIS — Z7901 Long term (current) use of anticoagulants: Secondary | ICD-10-CM | POA: Insufficient documentation

## 2018-02-15 DIAGNOSIS — Z8052 Family history of malignant neoplasm of bladder: Secondary | ICD-10-CM | POA: Diagnosis not present

## 2018-02-15 DIAGNOSIS — C220 Liver cell carcinoma: Secondary | ICD-10-CM | POA: Diagnosis not present

## 2018-02-15 DIAGNOSIS — Z6841 Body Mass Index (BMI) 40.0 and over, adult: Secondary | ICD-10-CM | POA: Diagnosis not present

## 2018-02-15 DIAGNOSIS — Z87891 Personal history of nicotine dependence: Secondary | ICD-10-CM | POA: Insufficient documentation

## 2018-02-15 DIAGNOSIS — Z8546 Personal history of malignant neoplasm of prostate: Secondary | ICD-10-CM | POA: Insufficient documentation

## 2018-02-15 DIAGNOSIS — Z8619 Personal history of other infectious and parasitic diseases: Secondary | ICD-10-CM | POA: Insufficient documentation

## 2018-02-15 DIAGNOSIS — Z79899 Other long term (current) drug therapy: Secondary | ICD-10-CM | POA: Insufficient documentation

## 2018-02-15 DIAGNOSIS — D649 Anemia, unspecified: Secondary | ICD-10-CM | POA: Insufficient documentation

## 2018-02-15 DIAGNOSIS — Z823 Family history of stroke: Secondary | ICD-10-CM | POA: Insufficient documentation

## 2018-02-15 DIAGNOSIS — Z9889 Other specified postprocedural states: Secondary | ICD-10-CM | POA: Diagnosis not present

## 2018-02-15 DIAGNOSIS — E785 Hyperlipidemia, unspecified: Secondary | ICD-10-CM | POA: Insufficient documentation

## 2018-02-15 DIAGNOSIS — Z881 Allergy status to other antibiotic agents status: Secondary | ICD-10-CM | POA: Insufficient documentation

## 2018-02-15 DIAGNOSIS — Z833 Family history of diabetes mellitus: Secondary | ICD-10-CM | POA: Diagnosis not present

## 2018-02-15 DIAGNOSIS — K219 Gastro-esophageal reflux disease without esophagitis: Secondary | ICD-10-CM | POA: Insufficient documentation

## 2018-02-15 DIAGNOSIS — I10 Essential (primary) hypertension: Secondary | ICD-10-CM | POA: Insufficient documentation

## 2018-02-15 DIAGNOSIS — Z7984 Long term (current) use of oral hypoglycemic drugs: Secondary | ICD-10-CM | POA: Diagnosis not present

## 2018-02-15 DIAGNOSIS — G4733 Obstructive sleep apnea (adult) (pediatric): Secondary | ICD-10-CM | POA: Diagnosis not present

## 2018-02-15 HISTORY — PX: IR EMBO TUMOR ORGAN ISCHEMIA INFARCT INC GUIDE ROADMAPPING: IMG5449

## 2018-02-15 HISTORY — PX: IR US GUIDE VASC ACCESS RIGHT: IMG2390

## 2018-02-15 HISTORY — PX: IR ANGIOGRAM VISCERAL SELECTIVE: IMG657

## 2018-02-15 HISTORY — DX: Cardiac arrhythmia, unspecified: I49.9

## 2018-02-15 HISTORY — PX: IR ANGIOGRAM SELECTIVE EACH ADDITIONAL VESSEL: IMG667

## 2018-02-15 HISTORY — DX: Unspecified urinary incontinence: R32

## 2018-02-15 LAB — GLUCOSE, CAPILLARY
GLUCOSE-CAPILLARY: 123 mg/dL — AB (ref 70–99)
Glucose-Capillary: 99 mg/dL (ref 70–99)

## 2018-02-15 LAB — CBC WITH DIFFERENTIAL/PLATELET
BASOS ABS: 0 10*3/uL (ref 0.0–0.1)
BASOS PCT: 0 %
Eosinophils Absolute: 0.2 10*3/uL (ref 0.0–0.7)
Eosinophils Relative: 2 %
HEMATOCRIT: 43.2 % (ref 39.0–52.0)
Hemoglobin: 15 g/dL (ref 13.0–17.0)
LYMPHS PCT: 22 %
Lymphs Abs: 2.3 10*3/uL (ref 0.7–4.0)
MCH: 30.5 pg (ref 26.0–34.0)
MCHC: 34.7 g/dL (ref 30.0–36.0)
MCV: 87.8 fL (ref 78.0–100.0)
MONOS PCT: 13 %
Monocytes Absolute: 1.3 10*3/uL — ABNORMAL HIGH (ref 0.1–1.0)
NEUTROS ABS: 6.4 10*3/uL (ref 1.7–7.7)
NEUTROS PCT: 63 %
Platelets: 318 10*3/uL (ref 150–400)
RBC: 4.92 MIL/uL (ref 4.22–5.81)
RDW: 13 % (ref 11.5–15.5)
WBC: 10.3 10*3/uL (ref 4.0–10.5)

## 2018-02-15 LAB — COMPREHENSIVE METABOLIC PANEL
ALBUMIN: 4 g/dL (ref 3.5–5.0)
ALT: 42 U/L (ref 0–44)
ANION GAP: 13 (ref 5–15)
AST: 37 U/L (ref 15–41)
Alkaline Phosphatase: 91 U/L (ref 38–126)
BUN: 13 mg/dL (ref 8–23)
CHLORIDE: 102 mmol/L (ref 98–111)
CO2: 29 mmol/L (ref 22–32)
Calcium: 9.5 mg/dL (ref 8.9–10.3)
Creatinine, Ser: 1.01 mg/dL (ref 0.61–1.24)
GFR calc Af Amer: >60 mL/min (ref >60)
GFR calc non Af Amer: >60 mL/min (ref >60)
Glucose, Bld: 132 mg/dL — ABNORMAL HIGH (ref 70–99)
POTASSIUM: 3.6 mmol/L (ref 3.5–5.1)
Sodium: 144 mmol/L (ref 135–145)
Total Bilirubin: 0.7 mg/dL (ref 0.3–1.2)
Total Protein: 7.2 g/dL (ref 6.5–8.1)

## 2018-02-15 LAB — PROTIME-INR
INR: 0.95
Prothrombin Time: 12.6 seconds (ref 11.4–15.2)

## 2018-02-15 MED ORDER — FUROSEMIDE 40 MG PO TABS: 40.0000 mg | ORAL_TABLET | Freq: Two times a day (BID) | ORAL | Status: DC | PRN

## 2018-02-15 MED ORDER — MIDAZOLAM HCL 2 MG/2ML IJ SOLN
INTRAMUSCULAR | Status: AC
  Filled 2018-02-15: qty 6

## 2018-02-15 MED ORDER — METOPROLOL SUCCINATE ER 50 MG PO TB24
50.0000 mg | ORAL_TABLET | Freq: Every day | ORAL | Status: DC
  Administered 2018-02-15 – 2018-02-16 (×2): 50 mg via ORAL
  Filled 2018-02-15 (×2): qty 1

## 2018-02-15 MED ORDER — LIDOCAINE HCL (PF) 1 % IJ SOLN
INTRAMUSCULAR | Status: AC | PRN
  Administered 2018-02-15: 5 mL

## 2018-02-15 MED ORDER — ONDANSETRON HCL 4 MG/2ML IJ SOLN: 4.0000 mg | Freq: Four times a day (QID) | INTRAMUSCULAR | Status: DC | PRN

## 2018-02-15 MED ORDER — IOHEXOL 300 MG/ML  SOLN
100.0000 mL | Freq: Once | INTRAMUSCULAR | Status: AC | PRN
  Administered 2018-02-15: 20 mL via INTRA_ARTERIAL

## 2018-02-15 MED ORDER — DIPHENHYDRAMINE HCL 50 MG/ML IJ SOLN: 12.5000 mg | Freq: Four times a day (QID) | INTRAMUSCULAR | Status: DC | PRN

## 2018-02-15 MED ORDER — SEMAGLUTIDE (1 MG/DOSE) 2 MG/1.5ML ~~LOC~~ SOPN: 1.0000 mg | PEN_INJECTOR | SUBCUTANEOUS | Status: DC

## 2018-02-15 MED ORDER — EZETIMIBE 10 MG PO TABS
10.0000 mg | ORAL_TABLET | Freq: Every day | ORAL | Status: DC
  Administered 2018-02-15 – 2018-02-16 (×2): 10 mg via ORAL
  Filled 2018-02-15 (×2): qty 1

## 2018-02-15 MED ORDER — FENTANYL CITRATE (PF) 100 MCG/2ML IJ SOLN
INTRAMUSCULAR | Status: AC | PRN
  Administered 2018-02-15 (×4): 50 ug via INTRAVENOUS

## 2018-02-15 MED ORDER — IOPAMIDOL (ISOVUE-300) INJECTION 61%: 100.0000 mL | Freq: Once | INTRAVENOUS | Status: DC | PRN

## 2018-02-15 MED ORDER — SODIUM CHLORIDE 0.9 % IV SOLN: INTRAVENOUS | Status: DC

## 2018-02-15 MED ORDER — METFORMIN HCL 500 MG PO TABS
1000.0000 mg | ORAL_TABLET | Freq: Two times a day (BID) | ORAL | Status: DC
  Administered 2018-02-16: 1000 mg via ORAL
  Filled 2018-02-15: qty 2

## 2018-02-15 MED ORDER — IOHEXOL 300 MG/ML  SOLN
100.0000 mL | Freq: Once | INTRAMUSCULAR | Status: AC | PRN
  Administered 2018-02-15: 5 mL via INTRA_ARTERIAL

## 2018-02-15 MED ORDER — RAMIPRIL 10 MG PO CAPS
10.0000 mg | ORAL_CAPSULE | Freq: Two times a day (BID) | ORAL | Status: DC
  Administered 2018-02-15 – 2018-02-16 (×2): 10 mg via ORAL
  Filled 2018-02-15 (×3): qty 1

## 2018-02-15 MED ORDER — PIPERACILLIN-TAZOBACTAM 3.375 G IVPB
3.3750 g | Freq: Once | INTRAVENOUS | Status: AC
  Administered 2018-02-15: 3.375 g via INTRAVENOUS
  Filled 2018-02-15: qty 50

## 2018-02-15 MED ORDER — HYDROMORPHONE 1 MG/ML IV SOLN
INTRAVENOUS | Status: DC
  Administered 2018-02-15: 0.9 mg via INTRAVENOUS
  Administered 2018-02-15: 16:00:00 via INTRAVENOUS
  Administered 2018-02-15: 0.9 mg via INTRAVENOUS
  Administered 2018-02-15 – 2018-02-16 (×3): 0.3 mg via INTRAVENOUS
  Filled 2018-02-15: qty 25

## 2018-02-15 MED ORDER — DIPHENHYDRAMINE HCL 12.5 MG/5ML PO ELIX
12.5000 mg | ORAL_SOLUTION | Freq: Four times a day (QID) | ORAL | Status: DC | PRN
  Filled 2018-02-15: qty 5

## 2018-02-15 MED ORDER — SODIUM CHLORIDE 0.9% FLUSH: 9.0000 mL | INTRAVENOUS | Status: DC | PRN

## 2018-02-15 MED ORDER — DILTIAZEM HCL ER COATED BEADS 240 MG PO CP24
240.0000 mg | ORAL_CAPSULE | Freq: Every day | ORAL | Status: DC
  Administered 2018-02-16: 240 mg via ORAL
  Filled 2018-02-15: qty 1

## 2018-02-15 MED ORDER — IOHEXOL 300 MG/ML  SOLN
100.0000 mL | Freq: Once | INTRAMUSCULAR | Status: AC | PRN
  Administered 2018-02-15: 60 mL via INTRA_ARTERIAL

## 2018-02-15 MED ORDER — NALOXONE HCL 0.4 MG/ML IJ SOLN: 0.4000 mg | INTRAMUSCULAR | Status: DC | PRN

## 2018-02-15 MED ORDER — MIDAZOLAM HCL 2 MG/2ML IJ SOLN
INTRAMUSCULAR | Status: AC | PRN
  Administered 2018-02-15 (×4): 1 mg via INTRAVENOUS

## 2018-02-15 MED ORDER — LORATADINE 10 MG PO TABS
10.0000 mg | ORAL_TABLET | Freq: Every day | ORAL | Status: DC
  Administered 2018-02-16: 10 mg via ORAL
  Filled 2018-02-15: qty 1

## 2018-02-15 MED ORDER — LIDOCAINE HCL 1 % IJ SOLN
INTRAMUSCULAR | Status: AC
  Filled 2018-02-15: qty 20

## 2018-02-15 MED ORDER — POTASSIUM CHLORIDE CRYS ER 20 MEQ PO TBCR
20.0000 meq | EXTENDED_RELEASE_TABLET | Freq: Once | ORAL | Status: AC
  Administered 2018-02-15: 20 meq via ORAL
  Filled 2018-02-15: qty 1

## 2018-02-15 MED ORDER — FENTANYL CITRATE (PF) 100 MCG/2ML IJ SOLN
INTRAMUSCULAR | Status: AC
  Filled 2018-02-15: qty 4

## 2018-02-15 NOTE — Procedures (Signed)
Pre-procedure Diagnosis: HCC Post-procedure Diagnosis: Same  Post bland embolization of the posterior segmental branch of the right lobe of the liver.    Complications: None Immediate  EBL: None  Keep right leg straight for 4 hrs.    SignedSandi Mariscal Pager: [phone] 02/15/2018, 3:26 PM

## 2018-02-15 NOTE — H&P (Signed)
Chief Complaint: Patient was seen in consultation today for hepatocellular carcinoma  Supervising Physician: Sandi Mariscal  Patient Status: [name] - Out-pt  History of Present Illness: Steve Jensen is a 77 y.o. male with past medical history of anemia, DM2, GERD, OSA, HTN, prostate cancer with recent findings of biopsy-proven hepatocellular carcinoma. He is under the care of Dr. Burr Medico who recommended surgical resection, however patient was evaluated and found to be a poor operative candidate. He has been referred to Interventional Radiology for possible embolization.  Patient met with Dr. Pascal Lux in consultation 01/26/18 to discuss treatment options.  After extensive discussion, patient has elected to proceed with bland embolization. Patient presents for procedure today in his usual state of health.  Denies fever, chills, nausea, vomiting, abdominal pain, cough, or shortness of breath.   He has been NPO.  He does not take blood thinners.   Past Medical History:  Diagnosis Date  . Anemia   . Diabetes mellitus type II, controlled ([place])    with neuropathy  . Dysrhythmia   . GERD (gastroesophageal reflux disease)   . Hyperlipidemia   . Hypertension   . Morbid obesity ([place])   . OSA (obstructive sleep apnea)    On BiPAP at 15/11cm H2O  . Prostate cancer ([place])   . Shingles    on face Nov 2010  . Urinary incontinence     Past Surgical History:  Procedure Laterality Date  . CARDIOVERSION N/A 09/06/2013   Procedure: CARDIOVERSION;  Surgeon: [name], MD;  Location: [facility] ENDOSCOPY;  Service: Cardiovascular;  Laterality: N/A;  . EYE SURGERY     cataract surgery bilat; surgery to correct droopy eyelids   . IR RADIOLOGIST EVAL & MGMT  01/26/2018  . knee surgery bilat     . PROSTATE SURGERY    . right shoulder rotator cuff surgery    . TONSILLECTOMY      Allergies: Lipitor [atorvastatin]; Simvastatin; and Zithromax [azithromycin]  Medications: Prior to Admission medications     Medication Sig Start Date End Date Taking? Authorizing Provider  diltiazem (CARTIA XT) 240 MG 24 hr capsule Take 1 capsule (240 mg total) by mouth daily. Please make yearly appt with Dr. Irish Lack for October for future refills. 1st attempt 11/17/17  Yes [name], MD  fish oil-omega-3 fatty acids 1000 MG capsule Take 2,400 mg by mouth 2 (two) times daily.   Yes [provider]  furosemide (LASIX) 40 MG tablet TAKE 1 TABLET BY MOUTH TWICE DAILY AS NEEDED Patient taking differently: Take 40 mg by mouth 2 (two) times daily as needed for edema. TAKE 1 TABLET BY MOUTH TWICE DAILY AS NEEDED 06/17/17  Yes [name], MD  ibuprofen (ADVIL,MOTRIN) 200 MG tablet Take 800 mg by mouth every 6 (six) hours as needed (PAIN). prn 06/30/13  Yes [provider]  loratadine (CLARITIN) 10 MG tablet Take 10 mg by mouth daily.   Yes [provider]  metFORMIN (GLUCOPHAGE) 1000 MG tablet Take 1,000 mg by mouth 2 (two) times daily. 06/27/14  Yes [provider]  metoprolol succinate (TOPROL-XL) 50 MG 24 hr tablet Take 50 mg by mouth daily.  08/10/13  Yes [provider]  Multiple Vitamins-Minerals (MULTIVITAMIN WITH MINERALS) tablet Take 1 tablet by mouth daily.   Yes [provider]  omeprazole (PRILOSEC) 20 MG capsule Take 20 mg by mouth daily.  11/29/12  Yes [provider]  potassium chloride SA (K-DUR,KLOR-CON) 20 MEQ tablet TAKE 1 TABLET BY MOUTH  EVERY DAY AS NEEDED WHILE TAKING FUROSEMIDE. Please make appt with Dr. Irish Lack for October. 1st attempt 11/24/17  Yes [name], MD  ramipril (ALTACE) 10 MG capsule Take 10 mg by mouth 2 (two) times daily. 01/11/13  Yes [provider]  Semaglutide (OZEMPIC) 1 MG/DOSE SOPN Inject 1 mg into the skin once a week. Monday    Yes [provider]  ZETIA 10 MG tablet Take 10 mg by mouth daily.  01/17/13  Yes [provider]  ELIQUIS 5 MG TABS tablet TAKE 1 TABLET(5 MG) BY  MOUTH TWICE DAILY 11/16/17   [name], MD     Family History  Problem Relation Age of Onset  . Hypertension Brother   . Diabetes Brother   . Cancer Brother        bladder cancer  . Diabetes Brother   . CVA Brother   . Hypertension Brother   . Prostate cancer Brother   . Diabetes Brother   . Hypertension Brother   . Heart disease Brother        CABG  . Heart attack Brother   . Stroke Brother     Social History   Socioeconomic History  . Marital status: Married    Spouse name: Not on file  . Number of children: Not on file  . Years of education: Not on file  . Highest education level: Not on file  Occupational History  . Not on file  Social Needs  . Financial resource strain: Not on file  . Food insecurity:    Worry: Not on file    Inability: Not on file  . Transportation needs:    Medical: Not on file    Non-medical: Not on file  Tobacco Use  . Smoking status: Former Smoker    Packs/day: 1.00    Years: 15.00    Pack years: 15.00    Last attempt to quit: 05/27/1975    Years since quitting: 42.7  . Smokeless tobacco: Never Used  Substance and Sexual Activity  . Alcohol use: No  . Drug use: No  . Sexual activity: Not on file  Lifestyle  . Physical activity:    Days per week: Not on file    Minutes per session: Not on file  . Stress: Not on file  Relationships  . Social connections:    Talks on phone: Not on file    Gets together: Not on file    Attends religious service: Not on file    Active member of club or organization: Not on file    Attends meetings of clubs or organizations: Not on file    Relationship status: Not on file  Other Topics Concern  . Not on file  Social History Narrative  . Not on file     Review of Systems: A 12 point ROS discussed and pertinent positives are indicated in the HPI above.  All other systems are negative.  Review of Systems  Constitutional: Negative for fatigue and fever.  Respiratory: Negative for  cough and shortness of breath.   Cardiovascular: Negative for chest pain.  Gastrointestinal: Negative for abdominal pain, constipation, diarrhea, nausea and vomiting.  Genitourinary: Negative for dysuria and flank pain.  Musculoskeletal: Negative for back pain.  Psychiatric/Behavioral: Negative for behavioral problems and confusion.    Vital Signs: BP 133/84 (BP Location: Right Arm)   Pulse 89   Temp 97.9 F (36.6 C) (Oral)   Resp 18   SpO2 94%  Physical Exam  Constitutional: He is oriented to person, place, and time. He appears well-developed. No distress.  Cardiovascular: Normal rate, regular rhythm and normal heart sounds. Exam reveals no gallop and no friction rub.  No murmur heard. Pulmonary/Chest: Effort normal and breath sounds normal. No respiratory distress.  Abdominal: Soft. He exhibits no distension. There is no tenderness.  Neurological: He is alert and oriented to person, place, and time.  Skin: Skin is warm and dry. He is not diaphoretic.  Psychiatric: He has a normal mood and affect. His behavior is normal. Judgment and thought content normal.  Nursing note and vitals reviewed.    MD Evaluation Airway: WNL Heart: WNL Abdomen: WNL Chest/ Lungs: WNL ASA  Classification: 3 Mallampati/Airway Score: One   Imaging: Ir Radiologist Eval & Mgmt  Result Date: 01/26/2018 Please refer to notes tab for details about interventional procedure. (Op Note)   Labs:  CBC: Recent Labs    12/22/17 0740 12/30/17 1606 02/15/18 1250  WBC 10.3 10.9* 10.3  HGB 15.1 15.2 15.0  HCT 44.0 44.7 43.2  PLT 274 343 318    COAGS: Recent Labs    12/22/17 0615 12/30/17 1606 02/15/18 1250  INR 0.99 1.08 0.95    BMP: Recent Labs    12/30/17 1606  NA 140  K 3.8  CL 100  CO2 27  GLUCOSE 126*  BUN 11  CALCIUM 9.8  CREATININE 1.08  GFRNONAA >60  GFRAA >60    LIVER FUNCTION TESTS: Recent Labs    12/30/17 1606  BILITOT 0.5  AST 44*  ALT 59*  ALKPHOS 105    PROT 7.3  ALBUMIN 3.9    TUMOR MARKERS: No results for input(s): AFPTM, CEA, CA199, CHROMGRNA in the last 8760 hours.  Assessment and Plan: Patient with past medical history of DM2, HTN, OSA, and prostate cancer presents with complaint of enlarging liver lesion.  Patient deemed not an operative candidate and was referred to Interventional Radiology by Dr. Burr Medico to discuss treatment options.  Patient met with Dr. Pascal Lux in consultation 01/26/18 and after discussion elected to proceed with bland embolization.  Patient presents today in their usual state of health.  He has been NPO and is not currently on blood thinners.   The Risks and benefits of embolization were discussed with the patient including, but not limited to bleeding, infection, vascular injury, post operative pain, or contrast induced renal failure.  This procedure involves the use of X-rays and because of the nature of the planned procedure, it is possible that we will have prolonged use of X-ray fluoroscopy.  Potential radiation risks to you include (but are not limited to) the following: - A slightly elevated risk for cancer several years later in life. This risk is typically less than 0.5% percent. This risk is low in comparison to the normal incidence of human cancer, which is 33% for women and 50% for men according to the [place]. - Radiation induced injury can include skin redness, resembling a rash, tissue breakdown / ulcers and hair loss (which can be temporary or permanent).  The likelihood of either of these occurring depends on the difficulty of the procedure and whether you are sensitive to radiation due to previous procedures, disease, or genetic conditions.  IF your procedure requires a prolonged use of radiation, you will be notified and given written instructions for further action. It is your responsibility to monitor the irradiated area for the 2 weeks following the procedure and to notify  your  physician if you are concerned that you have suffered a radiation induced injury.   All of the patient's questions were answered, patient is agreeable to proceed. Consent signed and in chart.  Thank you for this interesting consult.  I greatly enjoyed meeting Keenen F Minkin and look forward to participating in their care.  A copy of this report was sent to the requesting provider on this date.  Electronically Signed: [name], PA 02/15/2018, 1:19 PM   I spent a total of    15 Minutes in face to face in clinical consultation, greater than 50% of which was counseling/coordinating care for hepatocellular carcinoma.

## 2018-02-16 DIAGNOSIS — C220 Liver cell carcinoma: Secondary | ICD-10-CM | POA: Diagnosis not present

## 2018-02-16 MED ORDER — SODIUM CHLORIDE 0.9 % IV SOLN: INTRAVENOUS | Status: DC | PRN

## 2018-02-16 NOTE — Progress Notes (Signed)
Pt verbalized understanding of Dc instructions. All questions answered A family member is taking  Him home.

## 2018-02-16 NOTE — Discharge Summary (Signed)
Patient ID: ZOE NORDIN MRN: [id number] DOB/AGE: Oct 19, 1940 78 y.o.  Admit date: 02/15/2018 Discharge date: 02/16/2018  Supervising Physician: Sandi Mariscal  Patient Status: [facility] - In-pt  Admission Diagnoses: Hepatocellular carcinoma  Discharge Diagnoses:  Active Problems:   Hepatocellular carcinoma ([employer])   [place] (hepatocellular carcinoma) ([place])   Discharged Condition: Stable  Hospital Course: 77 y/o M with past medical history significant for DM II, HTN, HLD, OSA and prostate cancer presented to IR clinic by Dr. Pascal Lux on 01/26/18 for evaluation of embolization of biopsy proven [place]. It was determined that patient would not be a good candidate for microwave ablation given high likelihood of residual disease and/or track seeding, however he was deemed a candidate for bland embolization of posterior segmental branch of right liver lobe which was performed by Dr. Pascal Lux at [facility] on 5/99/35 without complication.    Patient was admitted overnight for observation - he denies any abdominal pain, chest pain, dyspnea, nausea, vomiting or abnormal bowel habits. He states he is looking forward to going home so that he can eat "some fried chicken." No overnight events reports by RN.    Consults: None  Significant Diagnostic Studies: Ir Angiogram Visceral Selective  Result Date: 02/15/2018 INDICATION: Biopsy-proven hepatocellular carcinoma. Please perform bland embolization 4 palliative purposes. Please refer to formal dictation the epic EMR dated 01/26/2018 for additional details. EXAM: 1. ULTRASOUND GUIDANCE FOR ARTERIAL ACCESS 2. SELECTIVE SUPERIOR MESENTERIC ARTERIOGRAM 3. SELECTIVE CELIAC ARTERIOGRAM 4. SELECTIVE COMMON HEPATIC ARTERIOGRAM 5. SELECTIVE GASTRODUODENAL ARTERIOGRAM 6. SELECTIVE ARTERIOGRAM OF THE POSTERIOR DIVISION OF THE RIGHT HEPATIC ARTERY WITH SUBSEQUENT FLUOROSCOPIC GUIDED PERCUTANEOUS BLAND EMBOLIZATION COMPARISON:  CT abdomen and pelvis - 12/10/2017; abdominal  MRI - 01/13/2018 MEDICATIONS: Zosyn 3.375 g IV; the antibiotic was administered with an appropriate time frame prior to the initiation of the procedure. CONTRAST:  60 cc Isovue-300 ANESTHESIA/SEDATION: Moderate (conscious) sedation was employed during this procedure. A total of Versed 4 mg and Fentanyl 200 mcg was administered intravenously. Moderate Sedation Time: 45 minutes. The patient's level of consciousness and vital signs were monitored continuously by radiology nursing throughout the procedure under my direct supervision. FLUOROSCOPY TIME:  11 minutes, 30 seconds (2,665 mGy) ACCESS: Right common femoral artery; hemostasis achieved with manual compression. COMPLICATIONS: None immediate. TECHNIQUE: Informed written consent was obtained from the patient after a discussion of the risks, benefits and alternatives to treatment. Questions regarding the procedure were encouraged and answered. A timeout was performed prior to the initiation of the procedure. The right groin was prepped and draped in the usual sterile fashion, and a sterile drape was applied covering the operative field. Maximum barrier sterile technique with sterile gowns and gloves were used for the procedure. A timeout was performed prior to the initiation of the procedure. Local anesthesia was provided with 1% lidocaine. The right femoral head was marked fluoroscopically. Under direct ultrasound guidance, the right common femoral artery was accessed with a micropuncture kit allowing placement of a of 5-French vascular sheath. An ultrasound image was saved for documentation purposes. A limited arteriogram performed through the side arm of the sheath confirming appropriate access within the right common femoral artery. Over a Britta Mccreedy wire, a Mickelson catheter was advanced to the level of the inferior thoracic aorta where it was reformed, back bled and flushed. The Mickelson catheter was utilized to select the superior mesenteric artery and a  selective superior mesenteric arteriogram was performed. The Mickelson catheter was then advanced cranially to select the celiac artery and a selective  celiac arteriogram was performed. Next, with the use of a fathom 14 microwire, a high-flow Renegade microcatheter was advanced select the common hepatic artery and a selective common hepatic arteriogram was performed. The microcatheter was advanced to select the gastroduodenal artery and a selective gastroduodenal arteriogram was performed With the use of the microwire, the microcatheter was advanced into the posterior inferior division of the right hepatic artery and a selective arteriogram was performed. The microcatheter was advanced further into the distal aspect of the vessel at the level of the main bifurcation. Selective arteriogram was performed. Next, the ill-defined hypervascular lesion within the posterior inferior aspect of the right lobe of the liver which percutaneously embolized with 100 - 300 micron Embospheres. The microcatheter was retracted and post embolization arteriogram was performed. The microcatheter was removed and a completion celiac arteriogram was performed via the [facility] catheter. Images were reviewed and the procedure was terminated. The right common femoral approach vascular sheath was removed and hemostasis was achieved with manual compression. A dressing was placed. The patient tolerated procedure well without immediate postprocedural complication. FINDINGS: Selective superior mesenteric arteriogram demonstrates conventional branching pattern without replaced or accessory arterial supply to the hepatic parenchyma. Note, the late portal venous imaging was unable to be obtained secondary to malfunction of the injector. Selective celiac arteriogram demonstrates absence of a typical proper hepatic artery with the common hepatic artery ending in the GDA, anterior division of the right hepatic artery as well as the medial segment of the  left hepatic artery as was demonstrated on preceding CTA. This was confirmed on subsequent common hepatic arteriogram. 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 Selective gastroduodenal arteriogram confirms the GDA gives rise to the posterior division of the right hepatic artery. Selective injection of the posterior division branch of the right hepatic artery confirms solitary supply to the ill-defined hypervascular lesion within the posterior inferior aspect the right lobe of the liver. Following bland embolization, there is a conspicuous absence of tumor blush. No additional vessels are identified supplying the ill-defined hypervascular lesion. The additional ill-defined subcentimeter lesion within segment 8 is not surprisingly inapparent angiographically. IMPRESSION: Technically successful bland embolization of the posterior division of the right hepatic artery supplying the dominant ill-defined hypervascular lesion within the posterior inferior aspect the right lobe of the liver. PLAN: - The patient will be admitted overnight for continued observation PCA usage. - The patient be seen in interventional radiology clinic in 3-4 weeks with postprocedural CMP. - Initial surveillance imaging will be performed in 3 months (late December 2019). Electronically Signed   By: Sandi Mariscal M.D.   On: 02/15/2018 17:49   Ir Angiogram Visceral Selective  Result Date: 02/15/2018 INDICATION: Biopsy-proven hepatocellular carcinoma. Please perform bland embolization 4 palliative purposes. Please refer to formal dictation the epic EMR dated 01/26/2018 for additional details. EXAM: 1. ULTRASOUND GUIDANCE FOR ARTERIAL ACCESS 2. SELECTIVE SUPERIOR MESENTERIC ARTERIOGRAM 3. SELECTIVE CELIAC ARTERIOGRAM 4. SELECTIVE COMMON HEPATIC ARTERIOGRAM 5. SELECTIVE GASTRODUODENAL ARTERIOGRAM 6. SELECTIVE ARTERIOGRAM OF THE POSTERIOR DIVISION OF  THE RIGHT HEPATIC ARTERY WITH SUBSEQUENT FLUOROSCOPIC GUIDED PERCUTANEOUS BLAND EMBOLIZATION COMPARISON:  CT abdomen and pelvis - 12/10/2017; abdominal MRI - 01/13/2018 MEDICATIONS: Zosyn 3.375 g IV; the antibiotic was administered with an appropriate time frame prior to the initiation of the procedure. CONTRAST:  60 cc Isovue-300 ANESTHESIA/SEDATION: Moderate (conscious) sedation was employed during this procedure. A total of Versed 4 mg and Fentanyl 200 mcg  was administered intravenously. Moderate Sedation Time: 45 minutes. The patient's level of consciousness and vital signs were monitored continuously by radiology nursing throughout the procedure under my direct supervision. FLUOROSCOPY TIME:  11 minutes, 30 seconds (2,665 mGy) ACCESS: Right common femoral artery; hemostasis achieved with manual compression. COMPLICATIONS: None immediate. TECHNIQUE: Informed written consent was obtained from the patient after a discussion of the risks, benefits and alternatives to treatment. Questions regarding the procedure were encouraged and answered. A timeout was performed prior to the initiation of the procedure. The right groin was prepped and draped in the usual sterile fashion, and a sterile drape was applied covering the operative field. Maximum barrier sterile technique with sterile gowns and gloves were used for the procedure. A timeout was performed prior to the initiation of the procedure. Local anesthesia was provided with 1% lidocaine. The right femoral head was marked fluoroscopically. Under direct ultrasound guidance, the right common femoral artery was accessed with a micropuncture kit allowing placement of a of 5-French vascular sheath. An ultrasound image was saved for documentation purposes. A limited arteriogram performed through the side arm of the sheath confirming appropriate access within the right common femoral artery. Over a Britta Mccreedy wire, a Mickelson catheter was advanced to the level of the inferior  thoracic aorta where it was reformed, back bled and flushed. The Mickelson catheter was utilized to select the superior mesenteric artery and a selective superior mesenteric arteriogram was performed. The Mickelson catheter was then advanced cranially to select the celiac artery and a selective celiac arteriogram was performed. Next, with the use of a fathom 14 microwire, a high-flow Renegade microcatheter was advanced select the common hepatic artery and a selective common hepatic arteriogram was performed. The microcatheter was advanced to select the gastroduodenal artery and a selective gastroduodenal arteriogram was performed With the use of the microwire, the microcatheter was advanced into the posterior inferior division of the right hepatic artery and a selective arteriogram was performed. The microcatheter was advanced further into the distal aspect of the vessel at the level of the main bifurcation. Selective arteriogram was performed. Next, the ill-defined hypervascular lesion within the posterior inferior aspect of the right lobe of the liver which percutaneously embolized with 100 - 300 micron Embospheres. The microcatheter was retracted and post embolization arteriogram was performed. The microcatheter was removed and a completion celiac arteriogram was performed via the [facility] catheter. Images were reviewed and the procedure was terminated. The right common femoral approach vascular sheath was removed and hemostasis was achieved with manual compression. A dressing was placed. The patient tolerated procedure well without immediate postprocedural complication. FINDINGS: Selective superior mesenteric arteriogram demonstrates conventional branching pattern without replaced or accessory arterial supply to the hepatic parenchyma. Note, the late portal venous imaging was unable to be obtained secondary to malfunction of the injector. Selective celiac arteriogram demonstrates absence of a typical proper  hepatic artery with the common hepatic artery ending in the GDA, anterior division of the right hepatic artery as well as the medial segment of the left hepatic artery as was demonstrated on preceding CTA. This was confirmed on subsequent common hepatic arteriogram. 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 Selective gastroduodenal arteriogram confirms the GDA gives rise to the posterior division of the right hepatic artery. Selective injection of the posterior division branch of the right hepatic artery confirms solitary supply to the ill-defined hypervascular lesion within the posterior inferior aspect  the right lobe of the liver. Following bland embolization, there is a conspicuous absence of tumor blush. No additional vessels are identified supplying the ill-defined hypervascular lesion. The additional ill-defined subcentimeter lesion within segment 8 is not surprisingly inapparent angiographically. IMPRESSION: Technically successful bland embolization of the posterior division of the right hepatic artery supplying the dominant ill-defined hypervascular lesion within the posterior inferior aspect the right lobe of the liver. PLAN: - The patient will be admitted overnight for continued observation PCA usage. - The patient be seen in interventional radiology clinic in 3-4 weeks with postprocedural CMP. - Initial surveillance imaging will be performed in 3 months (late December 2019). Electronically Signed   By: Sandi Mariscal M.D.   On: 02/15/2018 17:49   Ir Angiogram Selective Each Additional Vessel  Result Date: 02/15/2018 INDICATION: Biopsy-proven hepatocellular carcinoma. Please perform bland embolization 4 palliative purposes. Please refer to formal dictation the epic EMR dated 01/26/2018 for additional details. EXAM: 1. ULTRASOUND GUIDANCE FOR ARTERIAL ACCESS 2. SELECTIVE SUPERIOR MESENTERIC ARTERIOGRAM 3.  SELECTIVE CELIAC ARTERIOGRAM 4. SELECTIVE COMMON HEPATIC ARTERIOGRAM 5. SELECTIVE GASTRODUODENAL ARTERIOGRAM 6. SELECTIVE ARTERIOGRAM OF THE POSTERIOR DIVISION OF THE RIGHT HEPATIC ARTERY WITH SUBSEQUENT FLUOROSCOPIC GUIDED PERCUTANEOUS BLAND EMBOLIZATION COMPARISON:  CT abdomen and pelvis - 12/10/2017; abdominal MRI - 01/13/2018 MEDICATIONS: Zosyn 3.375 g IV; the antibiotic was administered with an appropriate time frame prior to the initiation of the procedure. CONTRAST:  60 cc Isovue-300 ANESTHESIA/SEDATION: Moderate (conscious) sedation was employed during this procedure. A total of Versed 4 mg and Fentanyl 200 mcg was administered intravenously. Moderate Sedation Time: 45 minutes. The patient's level of consciousness and vital signs were monitored continuously by radiology nursing throughout the procedure under my direct supervision. FLUOROSCOPY TIME:  11 minutes, 30 seconds (2,665 mGy) ACCESS: Right common femoral artery; hemostasis achieved with manual compression. COMPLICATIONS: None immediate. TECHNIQUE: Informed written consent was obtained from the patient after a discussion of the risks, benefits and alternatives to treatment. Questions regarding the procedure were encouraged and answered. A timeout was performed prior to the initiation of the procedure. The right groin was prepped and draped in the usual sterile fashion, and a sterile drape was applied covering the operative field. Maximum barrier sterile technique with sterile gowns and gloves were used for the procedure. A timeout was performed prior to the initiation of the procedure. Local anesthesia was provided with 1% lidocaine. The right femoral head was marked fluoroscopically. Under direct ultrasound guidance, the right common femoral artery was accessed with a micropuncture kit allowing placement of a of 5-French vascular sheath. An ultrasound image was saved for documentation purposes. A limited arteriogram performed through the side arm of  the sheath confirming appropriate access within the right common femoral artery. Over a Britta Mccreedy wire, a Mickelson catheter was advanced to the level of the inferior thoracic aorta where it was reformed, back bled and flushed. The Mickelson catheter was utilized to select the superior mesenteric artery and a selective superior mesenteric arteriogram was performed. The Mickelson catheter was then advanced cranially to select the celiac artery and a selective celiac arteriogram was performed. Next, with the use of a fathom 14 microwire, a high-flow Renegade microcatheter was advanced select the common hepatic artery and a selective common hepatic arteriogram was performed. The microcatheter was advanced to select the gastroduodenal artery and a selective gastroduodenal arteriogram was performed With the use of the microwire, the microcatheter was advanced into the posterior inferior division of the right hepatic artery and a selective arteriogram was performed.  The microcatheter was advanced further into the distal aspect of the vessel at the level of the main bifurcation. Selective arteriogram was performed. Next, the ill-defined hypervascular lesion within the posterior inferior aspect of the right lobe of the liver which percutaneously embolized with 100 - 300 micron Embospheres. The microcatheter was retracted and post embolization arteriogram was performed. The microcatheter was removed and a completion celiac arteriogram was performed via the [facility] catheter. Images were reviewed and the procedure was terminated. The right common femoral approach vascular sheath was removed and hemostasis was achieved with manual compression. A dressing was placed. The patient tolerated procedure well without immediate postprocedural complication. FINDINGS: Selective superior mesenteric arteriogram demonstrates conventional branching pattern without replaced or accessory arterial supply to the hepatic parenchyma. Note, the late  portal venous imaging was unable to be obtained secondary to malfunction of the injector. 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 This was confirmed on subsequent common hepatic arteriogram. 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 Selective gastroduodenal arteriogram confirms the GDA gives rise to the posterior division of the right hepatic artery. Selective injection of the posterior division branch of the right hepatic artery confirms solitary supply to the ill-defined hypervascular lesion within the posterior inferior aspect the right lobe of the liver. Following bland embolization, there is a conspicuous absence of tumor blush. No additional vessels are identified supplying the ill-defined hypervascular lesion. The additional ill-defined subcentimeter lesion within segment 8 is not surprisingly inapparent angiographically. IMPRESSION: Technically successful bland embolization of the posterior division of the right hepatic artery supplying the dominant ill-defined hypervascular lesion within the posterior inferior aspect the right lobe of the liver. PLAN: - The patient will be admitted overnight for continued observation PCA usage. - The patient be seen in interventional radiology clinic in 3-4 weeks with postprocedural CMP. - Initial surveillance imaging will be performed in 3 months (late December 2019). Electronically Signed   By: Sandi Mariscal M.D.   On: 02/15/2018 17:49   Ir Angiogram Selective Each Additional Vessel  Result Date: 02/15/2018 INDICATION: Biopsy-proven hepatocellular carcinoma. Please perform bland embolization 4 palliative purposes. Please refer to formal  dictation the epic EMR dated 01/26/2018 for additional details. EXAM: 1. ULTRASOUND GUIDANCE FOR ARTERIAL ACCESS 2. SELECTIVE SUPERIOR MESENTERIC ARTERIOGRAM 3. SELECTIVE CELIAC ARTERIOGRAM 4. SELECTIVE COMMON HEPATIC ARTERIOGRAM 5. SELECTIVE GASTRODUODENAL ARTERIOGRAM 6. SELECTIVE ARTERIOGRAM OF THE POSTERIOR DIVISION OF THE RIGHT HEPATIC ARTERY WITH SUBSEQUENT FLUOROSCOPIC GUIDED PERCUTANEOUS BLAND EMBOLIZATION COMPARISON:  CT abdomen and pelvis - 12/10/2017; abdominal MRI - 01/13/2018 MEDICATIONS: Zosyn 3.375 g IV; the antibiotic was administered with an appropriate time frame prior to the initiation of the procedure. CONTRAST:  60 cc Isovue-300 ANESTHESIA/SEDATION: Moderate (conscious) sedation was employed during this procedure. A total of Versed 4 mg and Fentanyl 200 mcg was administered intravenously. Moderate Sedation Time: 45 minutes. The patient's level of consciousness and vital signs were monitored continuously by radiology nursing throughout the procedure under my direct supervision. FLUOROSCOPY TIME:  11 minutes, 30 seconds (2,665 mGy) ACCESS: Right common femoral artery; hemostasis achieved with manual compression. COMPLICATIONS: None immediate. TECHNIQUE: Informed written consent was obtained from the patient after a discussion of the risks, benefits and alternatives to treatment. Questions  regarding the procedure were encouraged and answered. A timeout was performed prior to the initiation of the procedure. The right groin was prepped and draped in the usual sterile fashion, and a sterile drape was applied covering the operative field. Maximum barrier sterile technique with sterile gowns and gloves were used for the procedure. A timeout was performed prior to the initiation of the procedure. Local anesthesia was provided with 1% lidocaine. The right femoral head was marked fluoroscopically. Under direct ultrasound guidance, the right common femoral artery was accessed with a micropuncture kit  allowing placement of a of 5-French vascular sheath. An ultrasound image was saved for documentation purposes. A limited arteriogram performed through the side arm of the sheath confirming appropriate access within the right common femoral artery. Over a Britta Mccreedy wire, a Mickelson catheter was advanced to the level of the inferior thoracic aorta where it was reformed, back bled and flushed. The Mickelson catheter was utilized to select the superior mesenteric artery and a selective superior mesenteric arteriogram was performed. The Mickelson catheter was then advanced cranially to select the celiac artery and a selective celiac arteriogram was performed. Next, with the use of a fathom 14 microwire, a high-flow Renegade microcatheter was advanced select the common hepatic artery and a selective common hepatic arteriogram was performed. The microcatheter was advanced to select the gastroduodenal artery and a selective gastroduodenal arteriogram was performed With the use of the microwire, the microcatheter was advanced into the posterior inferior division of the right hepatic artery and a selective arteriogram was performed. The microcatheter was advanced further into the distal aspect of the vessel at the level of the main bifurcation. Selective arteriogram was performed. Next, the ill-defined hypervascular lesion within the posterior inferior aspect of the right lobe of the liver which percutaneously embolized with 100 - 300 micron Embospheres. The microcatheter was retracted and post embolization arteriogram was performed. The microcatheter was removed and a completion celiac arteriogram was performed via the [facility] catheter. Images were reviewed and the procedure was terminated. The right common femoral approach vascular sheath was removed and hemostasis was achieved with manual compression. A dressing was placed. The patient tolerated procedure well without immediate postprocedural complication. FINDINGS:  Selective superior mesenteric arteriogram demonstrates conventional branching pattern without replaced or accessory arterial supply to the hepatic parenchyma. Note, the late portal venous imaging was unable to be obtained secondary to malfunction of the injector. 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 This was confirmed on subsequent common hepatic arteriogram. 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 Selective gastroduodenal arteriogram confirms the GDA gives rise to the posterior division of the right hepatic artery. Selective injection of the posterior division branch of the right hepatic artery confirms solitary supply to the ill-defined hypervascular lesion within the posterior inferior aspect the right lobe of the liver. Following bland embolization, there is a conspicuous absence of tumor blush. No additional vessels are identified supplying the ill-defined hypervascular lesion. The additional ill-defined subcentimeter lesion within segment 8 is not surprisingly inapparent angiographically. IMPRESSION: Technically successful bland embolization of the posterior division of the right hepatic artery supplying the dominant ill-defined hypervascular lesion within the posterior inferior aspect the right lobe of the liver. PLAN: -  The patient will be admitted overnight for continued observation PCA usage. - The patient be seen in interventional radiology clinic in 3-4 weeks with postprocedural CMP. - Initial surveillance imaging will be performed in 3 months (late December 2019). Electronically Signed   By: Sandi Mariscal M.D.   On: 02/15/2018 17:49   Ir Angiogram Selective Each  Additional Vessel  Result Date: 02/15/2018 INDICATION: Biopsy-proven hepatocellular carcinoma. Please perform bland embolization 4 palliative purposes. Please refer to formal dictation the epic EMR dated 01/26/2018 for additional details. EXAM: 1. ULTRASOUND GUIDANCE FOR ARTERIAL ACCESS 2. SELECTIVE SUPERIOR MESENTERIC ARTERIOGRAM 3. SELECTIVE CELIAC ARTERIOGRAM 4. SELECTIVE COMMON HEPATIC ARTERIOGRAM 5. SELECTIVE GASTRODUODENAL ARTERIOGRAM 6. SELECTIVE ARTERIOGRAM OF THE POSTERIOR DIVISION OF THE RIGHT HEPATIC ARTERY WITH SUBSEQUENT FLUOROSCOPIC GUIDED PERCUTANEOUS BLAND EMBOLIZATION COMPARISON:  CT abdomen and pelvis - 12/10/2017; abdominal MRI - 01/13/2018 MEDICATIONS: Zosyn 3.375 g IV; the antibiotic was administered with an appropriate time frame prior to the initiation of the procedure. CONTRAST:  60 cc Isovue-300 ANESTHESIA/SEDATION: Moderate (conscious) sedation was employed during this procedure. A total of Versed 4 mg and Fentanyl 200 mcg was administered intravenously. Moderate Sedation Time: 45 minutes. The patient's level of consciousness and vital signs were monitored continuously by radiology nursing throughout the procedure under my direct supervision. FLUOROSCOPY TIME:  11 minutes, 30 seconds (2,665 mGy) ACCESS: Right common femoral artery; hemostasis achieved with manual compression. COMPLICATIONS: None immediate. TECHNIQUE: Informed written consent was obtained from the patient after a discussion of the risks, benefits and alternatives to treatment. Questions regarding the procedure were encouraged and answered. A timeout was performed prior to the initiation of the procedure. The right groin was prepped and draped in the usual sterile fashion, and a sterile drape was applied covering the operative field. Maximum barrier sterile technique with sterile gowns and gloves were used for the procedure. A timeout was performed prior to the initiation of the procedure. Local anesthesia was provided with  1% lidocaine. The right femoral head was marked fluoroscopically. Under direct ultrasound guidance, the right common femoral artery was accessed with a micropuncture kit allowing placement of a of 5-French vascular sheath. An ultrasound image was saved for documentation purposes. A limited arteriogram performed through the side arm of the sheath confirming appropriate access within the right common femoral artery. Over a Britta Mccreedy wire, a Mickelson catheter was advanced to the level of the inferior thoracic aorta where it was reformed, back bled and flushed. The Mickelson catheter was utilized to select the superior mesenteric artery and a selective superior mesenteric arteriogram was performed. The Mickelson catheter was then advanced cranially to select the celiac artery and a selective celiac arteriogram was performed. Next, with the use of a fathom 14 microwire, a high-flow Renegade microcatheter was advanced select the common hepatic artery and a selective common hepatic arteriogram was performed. The microcatheter was advanced to select the gastroduodenal artery and a selective gastroduodenal arteriogram was performed With the use of the microwire, the microcatheter was advanced into the posterior inferior division of the right hepatic artery and a selective arteriogram was performed. The microcatheter was advanced further into the distal aspect of the vessel at the level of the main bifurcation. Selective arteriogram was performed. Next, the ill-defined hypervascular lesion within the posterior inferior aspect of the right lobe of the liver which percutaneously embolized with 100 - 300 micron Embospheres. The microcatheter was retracted and post embolization arteriogram was performed. The microcatheter was removed and a completion celiac arteriogram was performed via the [facility]  catheter. Images were reviewed and the procedure was terminated. The right common femoral approach vascular sheath was removed and  hemostasis was achieved with manual compression. A dressing was placed. The patient tolerated procedure well without immediate postprocedural complication. FINDINGS: Selective superior mesenteric arteriogram demonstrates conventional branching pattern without replaced or accessory arterial supply to the hepatic parenchyma. Note, the late portal venous imaging was unable to be obtained secondary to malfunction of the injector. 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 This was confirmed on subsequent common hepatic arteriogram. 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 Selective gastroduodenal arteriogram confirms the GDA gives rise to the posterior division of the right hepatic artery. Selective injection of the posterior division branch of the right hepatic artery confirms solitary supply to the ill-defined hypervascular lesion within the posterior inferior aspect the right lobe of the liver. Following bland embolization, there is a conspicuous absence of tumor blush. No additional vessels are identified supplying the ill-defined hypervascular lesion. The additional ill-defined subcentimeter lesion within segment 8 is not surprisingly inapparent angiographically. IMPRESSION: Technically successful bland embolization of the posterior division of the right hepatic artery supplying the dominant ill-defined hypervascular lesion within the posterior inferior aspect the right lobe of the liver. PLAN: - The patient will be admitted overnight for continued observation PCA usage. - The patient be seen in interventional radiology clinic in 3-4 weeks with postprocedural CMP. - Initial surveillance  imaging will be performed in 3 months (late December 2019). Electronically Signed   By: Sandi Mariscal M.D.   On: 02/15/2018 17:49   Ir Angiogram Selective Each Additional Vessel  Result Date: 02/15/2018 INDICATION: Biopsy-proven hepatocellular carcinoma. Please perform bland embolization 4 palliative purposes. Please refer to formal dictation the epic EMR dated 01/26/2018 for additional details. EXAM: 1. ULTRASOUND GUIDANCE FOR ARTERIAL ACCESS 2. SELECTIVE SUPERIOR MESENTERIC ARTERIOGRAM 3. SELECTIVE CELIAC ARTERIOGRAM 4. SELECTIVE COMMON HEPATIC ARTERIOGRAM 5. SELECTIVE GASTRODUODENAL ARTERIOGRAM 6. SELECTIVE ARTERIOGRAM OF THE POSTERIOR DIVISION OF THE RIGHT HEPATIC ARTERY WITH SUBSEQUENT FLUOROSCOPIC GUIDED PERCUTANEOUS BLAND EMBOLIZATION COMPARISON:  CT abdomen and pelvis - 12/10/2017; abdominal MRI - 01/13/2018 MEDICATIONS: Zosyn 3.375 g IV; the antibiotic was administered with an appropriate time frame prior to the initiation of the procedure. CONTRAST:  60 cc Isovue-300 ANESTHESIA/SEDATION: Moderate (conscious) sedation was employed during this procedure. A total of Versed 4 mg and Fentanyl 200 mcg was administered intravenously. Moderate Sedation Time: 45 minutes. The patient's level of consciousness and vital signs were monitored continuously by radiology nursing throughout the procedure under my direct supervision. FLUOROSCOPY TIME:  11 minutes, 30 seconds (2,665 mGy) ACCESS: Right common femoral artery; hemostasis achieved with manual compression. COMPLICATIONS: None immediate. TECHNIQUE: Informed written consent was obtained from the patient after a discussion of the risks, benefits and alternatives to treatment. Questions regarding the procedure were encouraged and answered. A timeout was performed prior to the initiation of the procedure. The right groin was prepped and draped in the usual sterile fashion, and a sterile drape was applied covering the operative field. Maximum barrier sterile technique  with sterile gowns and gloves were used for the procedure. A timeout was performed prior to the initiation of the procedure. Local anesthesia was provided with 1% lidocaine.  The right femoral head was marked fluoroscopically. Under direct ultrasound guidance, the right common femoral artery was accessed with a micropuncture kit allowing placement of a of 5-French vascular sheath. An ultrasound image was saved for documentation purposes. A limited arteriogram performed through the side arm of the sheath confirming appropriate access within the right common femoral artery. Over a Britta Mccreedy wire, a Mickelson catheter was advanced to the level of the inferior thoracic aorta where it was reformed, back bled and flushed. The Mickelson catheter was utilized to select the superior mesenteric artery and a selective superior mesenteric arteriogram was performed. The Mickelson catheter was then advanced cranially to select the celiac artery and a selective celiac arteriogram was performed. Next, with the use of a fathom 14 microwire, a high-flow Renegade microcatheter was advanced select the common hepatic artery and a selective common hepatic arteriogram was performed. The microcatheter was advanced to select the gastroduodenal artery and a selective gastroduodenal arteriogram was performed With the use of the microwire, the microcatheter was advanced into the posterior inferior division of the right hepatic artery and a selective arteriogram was performed. The microcatheter was advanced further into the distal aspect of the vessel at the level of the main bifurcation. Selective arteriogram was performed. Next, the ill-defined hypervascular lesion within the posterior inferior aspect of the right lobe of the liver which percutaneously embolized with 100 - 300 micron Embospheres. The microcatheter was retracted and post embolization arteriogram was performed. The microcatheter was removed and a completion celiac arteriogram was  performed via the [facility] catheter. Images were reviewed and the procedure was terminated. The right common femoral approach vascular sheath was removed and hemostasis was achieved with manual compression. A dressing was placed. The patient tolerated procedure well without immediate postprocedural complication. FINDINGS: Selective superior mesenteric arteriogram demonstrates conventional branching pattern without replaced or accessory arterial supply to the hepatic parenchyma. Note, the late portal venous imaging was unable to be obtained secondary to malfunction of the injector. 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 This was confirmed on subsequent common hepatic arteriogram. 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 Selective gastroduodenal arteriogram confirms the GDA gives rise to the posterior division of the right hepatic artery. Selective injection of the posterior division branch of the right hepatic artery confirms solitary supply to the ill-defined hypervascular lesion within the posterior inferior aspect the right lobe of the liver. Following bland embolization, there is a conspicuous absence of tumor blush. No additional vessels are identified supplying the ill-defined hypervascular lesion. The additional ill-defined subcentimeter lesion within segment 8 is not surprisingly inapparent angiographically. IMPRESSION: Technically successful bland embolization of the posterior division of the right hepatic artery supplying the dominant ill-defined hypervascular lesion within the posterior inferior aspect the right lobe of the liver. PLAN: - The patient will be admitted  overnight for continued observation PCA usage. - The patient be seen in interventional radiology clinic in 3-4 weeks with postprocedural CMP. - Initial surveillance imaging will be performed in 3 months (late December 2019). Electronically Signed   By: Sandi Mariscal M.D.   On: 02/15/2018 17:49   Ir Angiogram Selective Each Additional Vessel  Result Date: 02/15/2018 INDICATION: Biopsy-proven hepatocellular carcinoma. Please perform  bland embolization 4 palliative purposes. Please refer to formal dictation the epic EMR dated 01/26/2018 for additional details. EXAM: 1. ULTRASOUND GUIDANCE FOR ARTERIAL ACCESS 2. SELECTIVE SUPERIOR MESENTERIC ARTERIOGRAM 3. SELECTIVE CELIAC ARTERIOGRAM 4. SELECTIVE COMMON HEPATIC ARTERIOGRAM 5. SELECTIVE GASTRODUODENAL ARTERIOGRAM 6. SELECTIVE ARTERIOGRAM OF THE POSTERIOR DIVISION OF THE RIGHT HEPATIC ARTERY WITH SUBSEQUENT FLUOROSCOPIC GUIDED PERCUTANEOUS BLAND EMBOLIZATION COMPARISON:  CT abdomen and pelvis - 12/10/2017; abdominal MRI - 01/13/2018 MEDICATIONS: Zosyn 3.375 g IV; the antibiotic was administered with an appropriate time frame prior to the initiation of the procedure. CONTRAST:  60 cc Isovue-300 ANESTHESIA/SEDATION: Moderate (conscious) sedation was employed during this procedure. A total of Versed 4 mg and Fentanyl 200 mcg was administered intravenously. Moderate Sedation Time: 45 minutes. The patient's level of consciousness and vital signs were monitored continuously by radiology nursing throughout the procedure under my direct supervision. FLUOROSCOPY TIME:  11 minutes, 30 seconds (2,665 mGy) ACCESS: Right common femoral artery; hemostasis achieved with manual compression. COMPLICATIONS: None immediate. TECHNIQUE: Informed written consent was obtained from the patient after a discussion of the risks, benefits and alternatives to treatment. Questions regarding the procedure were encouraged and answered. A timeout was performed prior to the initiation of the procedure.  The right groin was prepped and draped in the usual sterile fashion, and a sterile drape was applied covering the operative field. Maximum barrier sterile technique with sterile gowns and gloves were used for the procedure. A timeout was performed prior to the initiation of the procedure. Local anesthesia was provided with 1% lidocaine. The right femoral head was marked fluoroscopically. Under direct ultrasound guidance, the right common femoral artery was accessed with a micropuncture kit allowing placement of a of 5-French vascular sheath. An ultrasound image was saved for documentation purposes. A limited arteriogram performed through the side arm of the sheath confirming appropriate access within the right common femoral artery. Over a Britta Mccreedy wire, a Mickelson catheter was advanced to the level of the inferior thoracic aorta where it was reformed, back bled and flushed. The Mickelson catheter was utilized to select the superior mesenteric artery and a selective superior mesenteric arteriogram was performed. The Mickelson catheter was then advanced cranially to select the celiac artery and a selective celiac arteriogram was performed. Next, with the use of a fathom 14 microwire, a high-flow Renegade microcatheter was advanced select the common hepatic artery and a selective common hepatic arteriogram was performed. The microcatheter was advanced to select the gastroduodenal artery and a selective gastroduodenal arteriogram was performed With the use of the microwire, the microcatheter was advanced into the posterior inferior division of the right hepatic artery and a selective arteriogram was performed. The microcatheter was advanced further into the distal aspect of the vessel at the level of the main bifurcation. Selective arteriogram was performed. Next, the ill-defined hypervascular lesion within the posterior inferior aspect of the right lobe of the liver which percutaneously embolized with 100 - 300 micron  Embospheres. The microcatheter was retracted and post embolization arteriogram was performed. The microcatheter was removed and a completion celiac arteriogram was performed via the [facility] catheter. Images were reviewed and the procedure was terminated. The right common femoral approach vascular sheath was removed and hemostasis was achieved with manual compression. A dressing was placed. The patient tolerated procedure well without immediate postprocedural complication. FINDINGS: Selective superior mesenteric arteriogram demonstrates conventional branching pattern without replaced or accessory arterial supply to the hepatic parenchyma. Note, the late portal venous imaging was unable to be obtained secondary to malfunction  of the injector. 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 This was confirmed on subsequent common hepatic arteriogram. 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 Selective gastroduodenal arteriogram confirms the GDA gives rise to the posterior division of the right hepatic artery. Selective injection of the posterior division branch of the right hepatic artery confirms solitary supply to the ill-defined hypervascular lesion within the posterior inferior aspect the right lobe of the liver. Following bland embolization, there is a conspicuous absence of tumor blush. No additional vessels are identified supplying the ill-defined hypervascular lesion. The additional ill-defined subcentimeter lesion within segment 8 is not surprisingly inapparent angiographically. IMPRESSION: Technically successful bland embolization of the posterior division of the right hepatic artery  supplying the dominant ill-defined hypervascular lesion within the posterior inferior aspect the right lobe of the liver. PLAN: - The patient will be admitted overnight for continued observation PCA usage. - The patient be seen in interventional radiology clinic in 3-4 weeks with postprocedural CMP. - Initial surveillance imaging will be performed in 3 months (late December 2019). Electronically Signed   By: Sandi Mariscal M.D.   On: 02/15/2018 17:49   Ir US Guide Vasc Access Right  Result Date: 02/15/2018 INDICATION: Biopsy-proven hepatocellular carcinoma. Please perform bland embolization 4 palliative purposes. Please refer to formal dictation the epic EMR dated 01/26/2018 for additional details. EXAM: 1. ULTRASOUND GUIDANCE FOR ARTERIAL ACCESS 2. SELECTIVE SUPERIOR MESENTERIC ARTERIOGRAM 3. SELECTIVE CELIAC ARTERIOGRAM 4. SELECTIVE COMMON HEPATIC ARTERIOGRAM 5. SELECTIVE GASTRODUODENAL ARTERIOGRAM 6. SELECTIVE ARTERIOGRAM OF THE POSTERIOR DIVISION OF THE RIGHT HEPATIC ARTERY WITH SUBSEQUENT FLUOROSCOPIC GUIDED PERCUTANEOUS BLAND EMBOLIZATION COMPARISON:  CT abdomen and pelvis - 12/10/2017; abdominal MRI - 01/13/2018 MEDICATIONS: Zosyn 3.375 g IV; the antibiotic was administered with an appropriate time frame prior to the initiation of the procedure. CONTRAST:  60 cc Isovue-300 ANESTHESIA/SEDATION: Moderate (conscious) sedation was employed during this procedure. A total of Versed 4 mg and Fentanyl 200 mcg was administered intravenously. Moderate Sedation Time: 45 minutes. The patient's level of consciousness and vital signs were monitored continuously by radiology nursing throughout the procedure under my direct supervision. FLUOROSCOPY TIME:  11 minutes, 30 seconds (2,665 mGy) ACCESS: Right common femoral artery; hemostasis achieved with manual compression. COMPLICATIONS: None immediate. TECHNIQUE: Informed written consent was obtained from the patient after a discussion of the risks, benefits and alternatives  to treatment. Questions regarding the procedure were encouraged and answered. A timeout was performed prior to the initiation of the procedure. The right groin was prepped and draped in the usual sterile fashion, and a sterile drape was applied covering the operative field. Maximum barrier sterile technique with sterile gowns and gloves were used for the procedure. A timeout was performed prior to the initiation of the procedure. Local anesthesia was provided with 1% lidocaine. The right femoral head was marked fluoroscopically. Under direct ultrasound guidance, the right common femoral artery was accessed with a micropuncture kit allowing placement of a of 5-French vascular sheath. An ultrasound image was saved for documentation purposes. A limited arteriogram performed through the side arm of the sheath confirming appropriate access within the right common femoral artery. Over a Britta Mccreedy wire, a Mickelson catheter was advanced to the level of the  inferior thoracic aorta where it was reformed, back bled and flushed. The Mickelson catheter was utilized to select the superior mesenteric artery and a selective superior mesenteric arteriogram was performed. The Mickelson catheter was then advanced cranially to select the celiac artery and a selective celiac arteriogram was performed. Next, with the use of a fathom 14 microwire, a high-flow Renegade microcatheter was advanced select the common hepatic artery and a selective common hepatic arteriogram was performed. The microcatheter was advanced to select the gastroduodenal artery and a selective gastroduodenal arteriogram was performed With the use of the microwire, the microcatheter was advanced into the posterior inferior division of the right hepatic artery and a selective arteriogram was performed. The microcatheter was advanced further into the distal aspect of the vessel at the level of the main bifurcation. Selective arteriogram was performed. Next, the  ill-defined hypervascular lesion within the posterior inferior aspect of the right lobe of the liver which percutaneously embolized with 100 - 300 micron Embospheres. The microcatheter was retracted and post embolization arteriogram was performed. The microcatheter was removed and a completion celiac arteriogram was performed via the [facility] catheter. Images were reviewed and the procedure was terminated. The right common femoral approach vascular sheath was removed and hemostasis was achieved with manual compression. A dressing was placed. The patient tolerated procedure well without immediate postprocedural complication. FINDINGS: Selective superior mesenteric arteriogram demonstrates conventional branching pattern without replaced or accessory arterial supply to the hepatic parenchyma. Note, the late portal venous imaging was unable to be obtained secondary to malfunction of the injector. 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 This was confirmed on subsequent common hepatic arteriogram. 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 Selective gastroduodenal arteriogram confirms the GDA gives rise to the posterior division of the right hepatic artery. Selective injection of the posterior division branch of the right hepatic artery confirms solitary supply to the ill-defined hypervascular lesion within the posterior inferior aspect the right lobe of the liver. Following bland embolization, there is a conspicuous absence of tumor blush. No additional vessels are identified supplying the ill-defined hypervascular lesion. The additional ill-defined subcentimeter lesion within segment 8 is  not surprisingly inapparent angiographically. IMPRESSION: Technically successful bland embolization of the posterior division of the right hepatic artery supplying the dominant ill-defined hypervascular lesion within the posterior inferior aspect the right lobe of the liver. PLAN: - The patient will be admitted overnight for continued observation PCA usage. - The patient be seen in interventional radiology clinic in 3-4 weeks with postprocedural CMP. - Initial surveillance imaging will be performed in 3 months (late December 2019). Electronically Signed   By: Sandi Mariscal M.D.   On: 02/15/2018 17:49   Ir Embo Tumor Organ Ischemia Infarct Inc Guide Roadmapping  Result Date: 02/15/2018 INDICATION: Biopsy-proven hepatocellular carcinoma. Please perform bland embolization 4 palliative purposes. Please refer to formal dictation the epic EMR dated 01/26/2018 for additional details. EXAM: 1. ULTRASOUND GUIDANCE FOR ARTERIAL ACCESS 2. SELECTIVE SUPERIOR MESENTERIC ARTERIOGRAM 3. SELECTIVE CELIAC ARTERIOGRAM 4. SELECTIVE COMMON HEPATIC ARTERIOGRAM 5. SELECTIVE GASTRODUODENAL ARTERIOGRAM 6. SELECTIVE ARTERIOGRAM OF THE POSTERIOR DIVISION OF THE RIGHT HEPATIC ARTERY WITH SUBSEQUENT FLUOROSCOPIC GUIDED PERCUTANEOUS BLAND EMBOLIZATION COMPARISON:  CT abdomen and pelvis -  12/10/2017; abdominal MRI - 01/13/2018 MEDICATIONS: Zosyn 3.375 g IV; the antibiotic was administered with an appropriate time frame prior to the initiation of the procedure. CONTRAST:  60 cc Isovue-300 ANESTHESIA/SEDATION: Moderate (conscious) sedation was employed during this procedure. A total of Versed 4 mg and Fentanyl 200 mcg was administered intravenously. Moderate Sedation Time: 45 minutes. The patient's level of consciousness and vital signs were monitored continuously by radiology nursing throughout the procedure under my direct supervision. FLUOROSCOPY TIME:  11 minutes, 30 seconds (2,665 mGy) ACCESS: Right common femoral artery; hemostasis  achieved with manual compression. COMPLICATIONS: None immediate. TECHNIQUE: Informed written consent was obtained from the patient after a discussion of the risks, benefits and alternatives to treatment. Questions regarding the procedure were encouraged and answered. A timeout was performed prior to the initiation of the procedure. The right groin was prepped and draped in the usual sterile fashion, and a sterile drape was applied covering the operative field. Maximum barrier sterile technique with sterile gowns and gloves were used for the procedure. A timeout was performed prior to the initiation of the procedure. Local anesthesia was provided with 1% lidocaine. The right femoral head was marked fluoroscopically. Under direct ultrasound guidance, the right common femoral artery was accessed with a micropuncture kit allowing placement of a of 5-French vascular sheath. An ultrasound image was saved for documentation purposes. A limited arteriogram performed through the side arm of the sheath confirming appropriate access within the right common femoral artery. Over a Britta Mccreedy wire, a Mickelson catheter was advanced to the level of the inferior thoracic aorta where it was reformed, back bled and flushed. The Mickelson catheter was utilized to select the superior mesenteric artery and a selective superior mesenteric arteriogram was performed. The Mickelson catheter was then advanced cranially to select the celiac artery and a selective celiac arteriogram was performed. Next, with the use of a fathom 14 microwire, a high-flow Renegade microcatheter was advanced select the common hepatic artery and a selective common hepatic arteriogram was performed. The microcatheter was advanced to select the gastroduodenal artery and a selective gastroduodenal arteriogram was performed With the use of the microwire, the microcatheter was advanced into the posterior inferior division of the right hepatic artery and a selective  arteriogram was performed. The microcatheter was advanced further into the distal aspect of the vessel at the level of the main bifurcation. Selective arteriogram was performed. Next, the ill-defined hypervascular lesion within the posterior inferior aspect of the right lobe of the liver which percutaneously embolized with 100 - 300 micron Embospheres. The microcatheter was retracted and post embolization arteriogram was performed. The microcatheter was removed and a completion celiac arteriogram was performed via the [facility] catheter. Images were reviewed and the procedure was terminated. The right common femoral approach vascular sheath was removed and hemostasis was achieved with manual compression. A dressing was placed. The patient tolerated procedure well without immediate postprocedural complication. FINDINGS: Selective superior mesenteric arteriogram demonstrates conventional branching pattern without replaced or accessory arterial supply to the hepatic parenchyma. Note, the late portal venous imaging was unable to be obtained secondary to malfunction of the injector. 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 This was confirmed on subsequent common hepatic arteriogram. Celiac arteriogram also demonstrates a replaced left hepatic artery arising from the left gastric  artery as was also noted on preceding CTA. Selective gastroduodenal arteriogram confirms the GDA gives rise to the posterior division of the right hepatic artery. Selective injection of the posterior division branch of the right hepatic artery confirms solitary supply to the ill-defined hypervascular lesion within the posterior inferior aspect the right lobe of the liver. Following bland embolization, there is a  conspicuous absence of tumor blush. No additional vessels are identified supplying the ill-defined hypervascular lesion. The additional ill-defined subcentimeter lesion within segment 8 is not surprisingly inapparent angiographically. IMPRESSION: Technically successful bland embolization of the posterior division of the right hepatic artery supplying the dominant ill-defined hypervascular lesion within the posterior inferior aspect the right lobe of the liver. PLAN: - The patient will be admitted overnight for continued observation PCA usage. - The patient be seen in interventional radiology clinic in 3-4 weeks with postprocedural CMP. - Initial surveillance imaging will be performed in 3 months (late December 2019). Electronically Signed   By: Sandi Mariscal M.D.   On: 02/15/2018 17:49   Ir Radiologist Eval & Mgmt  Result Date: 01/26/2018 Please refer to notes tab for details about interventional procedure. (Op Note)   Treatments: Bland embolization of posterior segmental branch of right liver lobe  Discharge Exam: Blood pressure 134/80, pulse 82, temperature 98.3 F (36.8 C), temperature source Oral, resp. rate (!) 23, height 5' 11"  (1.803 m), weight 297 lb (134.7 kg), SpO2 96 %. Physical Exam  Constitutional: He is oriented to person, place, and time. No distress.  HENT:  Head: Normocephalic.  Cardiovascular: Normal rate, regular rhythm and normal heart sounds.  Pulmonary/Chest: Effort normal and breath sounds normal.  Abdominal: Soft. He exhibits no distension. There is no tenderness.  Neurological: He is alert and oriented to person, place, and time.  Skin: Skin is warm and dry. He is not diaphoretic.  Psychiatric: He has a normal mood and affect. His behavior is normal. Judgment and thought content normal.  Vitals reviewed.   Disposition: Home - follow-up with IR outpatient clinic in 3-4 weeks.   Allergies as of 02/16/2018      Reactions   Lipitor [atorvastatin] Other (See Comments)    Muscle aches   Simvastatin Other (See Comments)   Muscle aches   Zithromax [azithromycin] Other (See Comments)   GI-SIDE EFFECTS      Medication List    TAKE these medications   diltiazem 240 MG 24 hr capsule Commonly known as:  CARDIZEM CD Take 1 capsule (240 mg total) by mouth daily. Please make yearly appt with Dr. Irish Lack for October for future refills. 1st attempt   ELIQUIS 5 MG Tabs tablet Generic drug:  apixaban TAKE 1 TABLET(5 MG) BY MOUTH TWICE DAILY What changed:  See the new instructions.   fish oil-omega-3 fatty acids 1000 MG capsule Take 2,400 mg by mouth 2 (two) times daily.   furosemide 40 MG tablet Commonly known as:  LASIX TAKE 1 TABLET BY MOUTH TWICE DAILY AS NEEDED What changed:    how much to take  how to take this  when to take this  reasons to take this   gabapentin 100 MG capsule Commonly known as:  NEURONTIN Take 100-300 mg by mouth. For feet burning and insomnia   ibuprofen 200 MG tablet Commonly known as:  ADVIL,MOTRIN Take 800 mg by mouth every 6 (six) hours as needed (PAIN). prn   loratadine 10 MG tablet Commonly known as:  CLARITIN Take 10 mg by mouth daily.   metFORMIN 1000  MG tablet Commonly known as:  GLUCOPHAGE Take 1,000 mg by mouth 2 (two) times daily.   metoprolol succinate 50 MG 24 hr tablet Commonly known as:  TOPROL-XL Take 50 mg by mouth daily.   multivitamin with minerals tablet Take 1 tablet by mouth daily.   omeprazole 20 MG capsule Commonly known as:  PRILOSEC Take 20 mg by mouth daily.   OZEMPIC (1 MG/DOSE) 2 MG/1.5ML Sopn Generic drug:  Semaglutide (1 MG/DOSE) Inject 1 mg into the skin once a week. Monday   potassium chloride SA 20 MEQ tablet Commonly known as:  K-DUR,KLOR-CON TAKE 1 TABLET BY MOUTH EVERY DAY AS NEEDED WHILE TAKING FUROSEMIDE. Please make appt with Dr. Irish Lack for October. 1st attempt   ramipril 10 MG capsule Commonly known as:  ALTACE Take 10 mg by mouth 2 (two) times daily.     ZETIA 10 MG tablet Generic drug:  ezetimibe Take 10 mg by mouth daily.      Follow-up Information    Sandi Mariscal, MD Follow up.   Specialties:  Interventional Radiology, Radiology Why:  Scheduler will call with appointment Contact information: [address] [place] [address] [phone]            Electronically Signed: Joaquim Nam, PA-C 02/16/2018, 9:25 AM   I have spent Less Than 30 Minutes discharging [place].

## 2018-02-20 ENCOUNTER — Other Ambulatory Visit: Payer: Self-pay | Admitting: Interventional Cardiology

## 2018-02-22 ENCOUNTER — Other Ambulatory Visit: Payer: Self-pay | Admitting: *Deleted

## 2018-02-22 ENCOUNTER — Other Ambulatory Visit: Payer: Self-pay | Admitting: Interventional Cardiology

## 2018-02-22 DIAGNOSIS — C220 Liver cell carcinoma: Secondary | ICD-10-CM

## 2018-03-11 LAB — COMPLETE METABOLIC PANEL WITH GFR
AG Ratio: 1.7 (calc) (ref 1.0–2.5)
ALBUMIN MSPROF: 4 g/dL (ref 3.6–5.1)
ALKALINE PHOSPHATASE (APISO): 106 U/L (ref 40–115)
ALT: 42 U/L (ref 9–46)
AST: 44 U/L — ABNORMAL HIGH (ref 10–35)
BILIRUBIN TOTAL: 0.3 mg/dL (ref 0.2–1.2)
BUN: 12 mg/dL (ref 7–25)
CO2: 25 mmol/L (ref 20–32)
Calcium: 10 mg/dL (ref 8.6–10.3)
Chloride: 101 mmol/L (ref 98–110)
Creat: 0.95 mg/dL (ref 0.70–1.18)
GFR, Est African American: 90 mL/min/{1.73_m2} (ref >=60)
GFR, Est Non African American: 77 mL/min/{1.73_m2} (ref >=60)
GLUCOSE: 174 mg/dL — AB (ref 65–139)
Globulin: 2.4 g/dL (calc) (ref 1.9–3.7)
Potassium: 4.1 mmol/L (ref 3.5–5.3)
SODIUM: 140 mmol/L (ref 135–146)
Total Protein: 6.4 g/dL (ref 6.1–8.1)

## 2018-03-17 ENCOUNTER — Ambulatory Visit
Admission: RE | Admit: 2018-03-17 | Discharge: 2018-03-17 | Disposition: A | Discharge status: Home / Self Care | Payer: Medicare Other | Source: Ambulatory Visit | Attending: Physician Assistant | Admitting: Physician Assistant

## 2018-03-17 DIAGNOSIS — C220 Liver cell carcinoma: Secondary | ICD-10-CM

## 2018-03-17 HISTORY — PX: IR RADIOLOGIST EVAL & MGMT: IMG5224
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
## 2018-03-18 ENCOUNTER — Encounter: Payer: Self-pay | Admitting: *Deleted

## 2018-03-22 ENCOUNTER — Other Ambulatory Visit: Payer: Self-pay | Admitting: Interventional Cardiology

## 2018-05-12 ENCOUNTER — Other Ambulatory Visit: Payer: Self-pay | Admitting: Interventional Cardiology

## 2018-05-19 ENCOUNTER — Other Ambulatory Visit: Payer: Self-pay | Admitting: Interventional Cardiology
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
## 2018-05-27 ENCOUNTER — Encounter: Payer: Self-pay | Admitting: Interventional Cardiology

## 2018-05-27 ENCOUNTER — Ambulatory Visit: Payer: Medicare Other | Admitting: Interventional Cardiology

## 2018-05-27 VITALS — BP 132/76 | HR 83 | Ht 71.0 in | Wt 303.2 lb

## 2018-05-27 DIAGNOSIS — I110 Hypertensive heart disease with heart failure: Secondary | ICD-10-CM

## 2018-05-27 DIAGNOSIS — I480 Paroxysmal atrial fibrillation: Secondary | ICD-10-CM

## 2018-05-27 DIAGNOSIS — I5032 Chronic diastolic (congestive) heart failure: Secondary | ICD-10-CM | POA: Diagnosis not present

## 2018-05-27 NOTE — Patient Instructions (Signed)

## 2018-06-01 ENCOUNTER — Other Ambulatory Visit: Payer: Self-pay | Admitting: Interventional Radiology

## 2018-06-01 DIAGNOSIS — C220 Liver cell carcinoma: Secondary | ICD-10-CM

## 2018-06-15 ENCOUNTER — Encounter (HOSPITAL_COMMUNITY): Payer: Self-pay

## 2018-06-15 ENCOUNTER — Ambulatory Visit: Payer: Medicare Other

## 2018-06-15 ENCOUNTER — Ambulatory Visit (HOSPITAL_COMMUNITY)
Admission: RE | Admit: 2018-06-15 | Discharge: 2018-06-15 | Disposition: A | Discharge status: Home / Self Care | Payer: Medicare Other | Source: Ambulatory Visit | Attending: Interventional Radiology | Admitting: Interventional Radiology

## 2018-06-15 ENCOUNTER — Other Ambulatory Visit: Payer: Self-pay | Admitting: Hematology

## 2018-06-15 DIAGNOSIS — C220 Liver cell carcinoma: Secondary | ICD-10-CM

## 2018-06-16 ENCOUNTER — Other Ambulatory Visit: Payer: Self-pay | Admitting: Radiology

## 2018-06-16 MED ORDER — DIAZEPAM 5 MG PO TABS: 10.0000 mg | ORAL_TABLET | ORAL | 0 refills | Status: DC

## 2018-06-18 ENCOUNTER — Other Ambulatory Visit: Payer: Self-pay | Admitting: Interventional Cardiology

## 2018-06-29 ENCOUNTER — Ambulatory Visit
Admission: RE | Admit: 2018-06-29 | Discharge: 2018-06-29 | Disposition: A | Discharge status: Home / Self Care | Payer: Medicare Other | Source: Ambulatory Visit | Attending: Interventional Radiology | Admitting: Interventional Radiology

## 2018-06-29 ENCOUNTER — Other Ambulatory Visit: Payer: Self-pay | Admitting: Interventional Radiology

## 2018-06-29 ENCOUNTER — Ambulatory Visit: Admission: RE | Admit: 2018-06-29 | Payer: Medicare Other | Source: Ambulatory Visit

## 2018-06-29 ENCOUNTER — Ambulatory Visit
Admission: RE | Admit: 2018-06-29 | Discharge: 2018-06-29 | Disposition: A | Discharge status: Home / Self Care | Payer: Medicare Other | Source: Ambulatory Visit | Attending: Hematology | Admitting: Hematology

## 2018-06-29 ENCOUNTER — Other Ambulatory Visit: Payer: Self-pay | Admitting: Hematology

## 2018-06-29 ENCOUNTER — Encounter: Payer: Self-pay | Admitting: *Deleted

## 2018-06-29 DIAGNOSIS — C220 Liver cell carcinoma: Secondary | ICD-10-CM

## 2018-06-29 HISTORY — PX: IR RADIOLOGIST EVAL & MGMT: IMG5224

## 2018-06-29 MED ORDER — GADOXETATE DISODIUM 0.25 MMOL/ML IV SOLN
10.0000 mL | Freq: Once | INTRAVENOUS | Status: AC | PRN
  Administered 2018-06-29: 10 mL via INTRAVENOUS

## 2018-06-29 NOTE — Progress Notes (Signed)
Chief Complaint: Patient was seen in consultation today for  Chief Complaint  Patient presents with  . Follow-up    4 mo follow up Quincy Carnes      Referring Physician(s): Dr. Alexis Goodell. Burr Medico  Supervising Physician: Sandi Mariscal  History of Present Illness: Steve Jensen is a 78 y.o. male with past medical history significant for morbid obesity, OSA with current CPAP use, HLD, HTN, anemia, DM, GERD, prostate cancer and biopsy-proven HCC s/p successful bland embolization of the right liver lobe 02/15/2018 with Dr. Pascal Lux. He was last seen in our clinic on 03/17/2018 and was doing well at that time. He returns today for routine 4 month follow up including post procedure MRI.  Patient reports he feels that he is more fatigued than ever, he states he sleeps well at night and feels rested when he wakes up but quickly begins to tire out which prevents him from participating in a lot of hobbies he used to enjoy. He states that he can sleep "from 12 to 12 and nothing changes." He states he has been compliant with his CPAP but has not noticed much of a difference in his fatigue level. He also complains of neuropathy in both of his lower extremities which he states is followed by Dr. Brigitte Pulse but that "I can't have any medicine for it because of my liver, it's a catch 22." He reports good appetite and is trying to manage his weight but finds it difficult due to fatigue. He denies any GI complaints or pain.   Past Medical History:  Diagnosis Date  . Anemia   . Diabetes mellitus type II, controlled ([place])    with neuropathy  . Dysrhythmia   . GERD (gastroesophageal reflux disease)   . Hyperlipidemia   . Hypertension   . Morbid obesity ([place])   . OSA (obstructive sleep apnea)    On BiPAP at 15/11cm H2O  . Prostate cancer ([place])   . Shingles    on face Nov 2010  . Urinary incontinence     Past Surgical History:  Procedure Laterality Date  . CARDIOVERSION N/A 09/06/2013   Procedure:  CARDIOVERSION;  Surgeon: [name], MD;  Location: [facility] ENDOSCOPY;  Service: Cardiovascular;  Laterality: N/A;  . EYE SURGERY     cataract surgery bilat; surgery to correct droopy eyelids   . IR ANGIOGRAM SELECTIVE EACH ADDITIONAL VESSEL  02/15/2018  . IR ANGIOGRAM SELECTIVE EACH ADDITIONAL VESSEL  02/15/2018  . IR ANGIOGRAM SELECTIVE EACH ADDITIONAL VESSEL  02/15/2018  . IR ANGIOGRAM SELECTIVE EACH ADDITIONAL VESSEL  02/15/2018  . IR ANGIOGRAM SELECTIVE EACH ADDITIONAL VESSEL  02/15/2018  . IR ANGIOGRAM VISCERAL SELECTIVE  02/15/2018  . IR ANGIOGRAM VISCERAL SELECTIVE  02/15/2018  . IR EMBO TUMOR ORGAN ISCHEMIA INFARCT INC GUIDE ROADMAPPING  02/15/2018  . IR RADIOLOGIST EVAL & MGMT  01/26/2018  . IR RADIOLOGIST EVAL & MGMT  03/17/2018  . IR US GUIDE VASC ACCESS RIGHT  02/15/2018  . knee surgery bilat     . PROSTATE SURGERY    . right shoulder rotator cuff surgery    . TONSILLECTOMY      Allergies: Lipitor [atorvastatin]; Simvastatin; and Zithromax [azithromycin]  Medications: Prior to Admission medications   Medication Sig Start Date End Date Taking? Authorizing Provider  diltiazem (CARDIZEM CD) 240 MG 24 hr capsule Take 1 capsule (240 mg total) by mouth daily. Please keep upcoming appt in January with Dr. Irish Lack for future refills. Thank you 05/13/18  Yes Irish Lack,  Charlann Lange, MD  ELIQUIS 5 MG TABS tablet TAKE 1 TABLET(5 MG) BY MOUTH TWICE DAILY 05/20/18  Yes [name], MD  fish oil-omega-3 fatty acids 1000 MG capsule Take 2,400 mg by mouth 2 (two) times daily.   Yes [provider]  furosemide (LASIX) 40 MG tablet TAKE 1 TABLET BY MOUTH TWICE DAILY AS NEEDED 06/18/18  Yes [name], MD  gabapentin (NEURONTIN) 100 MG capsule Take 100-300 mg by mouth. For feet burning and insomnia 02/13/18  Yes [provider]  ibuprofen (ADVIL,MOTRIN) 200 MG tablet Take 800 mg by mouth every 6 (six) hours as needed (PAIN). prn 06/30/13  Yes [provider]    loratadine (CLARITIN) 10 MG tablet Take 10 mg by mouth daily.   Yes [provider]  metFORMIN (GLUCOPHAGE) 1000 MG tablet Take 1,000 mg by mouth 2 (two) times daily. 06/27/14  Yes [provider]  metoprolol succinate (TOPROL-XL) 50 MG 24 hr tablet Take 50 mg by mouth daily.  08/10/13  Yes [provider]  Multiple Vitamins-Minerals (MULTIVITAMIN WITH MINERALS) tablet Take 1 tablet by mouth daily.   Yes [provider]  omeprazole (PRILOSEC) 20 MG capsule Take 20 mg by mouth daily.  11/29/12  Yes [provider]  potassium chloride SA (K-DUR,KLOR-CON) 20 MEQ tablet TAKE 1 TABLET BY MOUTH EVERY DAY AS NEEDED WHILE TAKING FUROSEMIDE 06/18/18  Yes [name], MD  ramipril (ALTACE) 10 MG capsule Take 10 mg by mouth 2 (two) times daily. 01/11/13  Yes [provider]  Semaglutide (OZEMPIC) 1 MG/DOSE SOPN Inject 1 mg into the skin once a week. Monday    Yes [provider]  ZETIA 10 MG tablet Take 10 mg by mouth daily.  01/17/13  Yes [provider]  diazepam (VALIUM) 5 MG tablet Take 2 tablets (10 mg total) by mouth as directed. Prior to MR.  May take additional 1 tablet (5 mg) by mouth if needed. 06/29/18   Sandi Mariscal, MD     Family History  Problem Relation Age of Onset  . Hypertension Brother   . Diabetes Brother   . Cancer Brother        bladder cancer  . Diabetes Brother   . CVA Brother   . Hypertension Brother   . Prostate cancer Brother   . Diabetes Brother   . Hypertension Brother   . Heart disease Brother        CABG  . Heart attack Brother   . Stroke Brother     Social History   Socioeconomic History  . Marital status: Married    Spouse name: Not on file  . Number of children: Not on file  . Years of education: Not on file  . Highest education level: Not on file  Occupational History  . Not on file  Social Needs  . Financial resource strain: Not on file  . Food insecurity:    Worry: Not on file     Inability: Not on file  . Transportation needs:    Medical: Not on file    Non-medical: Not on file  Tobacco Use  . Smoking status: Former Smoker    Packs/day: 1.00    Years: 15.00    Pack years: 15.00    Last attempt to quit: 05/27/1975    Years since quitting: 43.1  . Smokeless tobacco: Never Used  Substance and Sexual Activity  . Alcohol use: No  . Drug use: No  . Sexual activity:  Not on file  Lifestyle  . Physical activity:    Days per week: Not on file    Minutes per session: Not on file  . Stress: Not on file  Relationships  . Social connections:    Talks on phone: Not on file    Gets together: Not on file    Attends religious service: Not on file    Active member of club or organization: Not on file    Attends meetings of clubs or organizations: Not on file    Relationship status: Not on file  Other Topics Concern  . Not on file  Social History Narrative  . Not on file     Review of Systems: A 12 point ROS discussed and pertinent positives are indicated in the HPI above.  All other systems are negative.  Review of Systems  Constitutional: Positive for activity change (less due to being tired) and fatigue. Negative for appetite change, chills, fever and unexpected weight change.  Respiratory: Negative for cough and shortness of breath.   Cardiovascular: Negative for chest pain.  Gastrointestinal: Negative for abdominal distention, abdominal pain, blood in stool, constipation, diarrhea, nausea and vomiting.  Genitourinary: Negative for dysuria and hematuria.  Musculoskeletal: Positive for arthralgias.  Skin: Negative for color change and wound.  Neurological: Positive for numbness (bilateral lower extremities). Negative for dizziness, weakness and headaches.  Psychiatric/Behavioral: Negative for confusion.    Vital Signs: BP (!) 119/56   Pulse 75   Temp 98.3 F (36.8 C) (Oral)   Resp 17   Ht 5\' 11"  (1.803 m)   Wt 295 lb (133.8 kg)   SpO2 95%   BMI  41.14 kg/m   Physical Exam Vitals signs reviewed.  Constitutional:      General: He is not in acute distress.    Appearance: He is obese.     Comments: Obese male. Somnolent initially on exam - likely d/t receiving Valium prior to MRI. Easily arouses to voice cues. Pleasant, talkative, answers questions appropriately.   HENT:     Head: Normocephalic.  Eyes:     General: No scleral icterus. Cardiovascular:     Rate and Rhythm: Normal rate and regular rhythm.  Pulmonary:     Effort: Pulmonary effort is normal.     Breath sounds: Normal breath sounds.  Abdominal:     Tenderness: There is no abdominal tenderness.  Skin:    General: Skin is warm and dry.     Coloration: Skin is not jaundiced.  Neurological:     Mental Status: He is alert and oriented to person, place, and time.  Psychiatric:        Mood and Affect: Mood normal.        Behavior: Behavior normal.        Thought Content: Thought content normal.        Judgment: Judgment normal.     Imaging: Mr Abdomen Wwo Contrast  Result Date: 06/29/2018 CLINICAL DATA:  Follow-up hepatocellular carcinoma. Status post bland embolization of [facility] 02/15/2018. EXAM: MRI ABDOMEN WITHOUT AND WITH CONTRAST TECHNIQUE: Multiplanar multisequence MR imaging of the abdomen was performed both before and after the administration of intravenous contrast. CONTRAST:  30mL EOVIST GADOXETATE DISODIUM 0.25 MOL/L IV SOLN COMPARISON:  MRI 01/13/2018 FINDINGS: Examination is extremely limited due to breathing motion artifact. Probably not worth getting any future MRIs for this patient. Triple phase CT may be a much better study in this situation. Lower chest: Grossly clear. Hepatobiliary: Stable cirrhotic changes involving  the liver along with steatosis. Images are severely degraded but the segment 6 liver lesion is much smaller. On the early arterial phase images there does appear to be some enhancement in or around the lesion but I do not see any obvious  enhancement on the subsequent sequences. It measures a maximum of 2 cm on series 10, image 70. It previously measured over 3 cm. On the prior MRI there was a small early arterial phase enhancing lesion in segment 8. I do not see this for certain on today's exam. No biliary dilatation. The gallbladder is grossly normal. No common bile duct dilatation. Pancreas:  No mass, inflammation or ductal dilatation is identified. Spleen:  Normal size.  No focal lesions. Adrenals/Urinary Tract: Stable right adrenal gland adenoma. The left adrenal gland is normal. No worrisome renal lesions or hydronephrosis. Stable renal cysts. Stomach/Bowel: Visualized portions within the abdomen are unremarkable. Vascular/Lymphatic: No pathologically enlarged lymph nodes identified. No abdominal aortic aneurysm demonstrated. Other:  No ascites or abdominal wall hernia. Musculoskeletal: No significant findings. IMPRESSION: 1. Very limited examination. Triple phase CT scan may be a better test for follow-up of this patient. 2. Suspect early arterial phase enhancement in and around the segment 6 liver lesion but there is a lot of artifact going right through this area. The lesion is smaller, measuring a maximum of 2 cm (previously 3.9 cm). 3. Small segment 8 lesion is not identified on this study. No new lesions are identified. 4. Stable right adrenal gland adenoma. 5. Stable cirrhotic changes involving the liver along with steatosis. Electronically Signed   By: Marijo Sanes M.D.   On: 06/29/2018 10:36    Labs:  CBC: Recent Labs    12/22/17 0740 12/30/17 1606 02/15/18 1250  WBC 10.3 10.9* 10.3  HGB 15.1 15.2 15.0  HCT 44.0 44.7 43.2  PLT 274 343 318    COAGS: Recent Labs    12/22/17 0615 12/30/17 1606 02/15/18 1250  INR 0.99 1.08 0.95    BMP: Recent Labs    12/30/17 1606 02/15/18 1250 03/11/18 1345  NA 140 144 140  K 3.8 3.6 4.1  CL 100 102 101  CO2 27 29 25   GLUCOSE 126* 132* 174*  BUN 11 13 12   CALCIUM 9.8  9.5 10.0  CREATININE 1.08 1.01 0.95  GFRNONAA >60 >60 77  GFRAA >60 >60 90    LIVER FUNCTION TESTS: Recent Labs    12/30/17 1606 02/15/18 1250 03/11/18 1345  BILITOT 0.5 0.7 0.3  AST 44* 37 44*  ALT 59* 42 42  ALKPHOS 105 91  --   PROT 7.3 7.2 6.4  ALBUMIN 3.9 4.0  --     TUMOR MARKERS: No results for input(s): AFPTM, CEA, CA199, CHROMGRNA in the last 8760 hours.  Assessment:  Patient with biopsy-proven [place] s/p bland embolization of right hepatic lobe 02/15/2018 by Dr. Pascal Lux who presents today for routine 4 month follow up including MRI. He continues to complain of fatigue which he feels has worsened since last visit. He denies any GI complaints. He reports that he sees his other specialists and PCP regularly.   Patient encouraged to continue to follow up with other providers as indicated, maintain medication compliance and to call IR clinic with any questions or concerns prior to next visit.    Electronically Signed: Joaquim Nam PA-C 06/29/2018, 11:14 AM   Please refer to Dr. Pascal Lux attestation of this note for management and plan.

## 2018-07-02 ENCOUNTER — Telehealth: Payer: Self-pay

## 2018-07-02 ENCOUNTER — Inpatient Hospital Stay: Payer: Medicare Other | Attending: Hematology | Admitting: Hematology

## 2018-07-02 ENCOUNTER — Inpatient Hospital Stay: Payer: Medicare Other

## 2018-07-02 VITALS — BP 136/88 | HR 79 | Temp 97.9°F | Resp 17 | Ht 71.0 in | Wt 302.0 lb

## 2018-07-02 DIAGNOSIS — Z7901 Long term (current) use of anticoagulants: Secondary | ICD-10-CM

## 2018-07-02 DIAGNOSIS — Z79899 Other long term (current) drug therapy: Secondary | ICD-10-CM | POA: Insufficient documentation

## 2018-07-02 DIAGNOSIS — E119 Type 2 diabetes mellitus without complications: Secondary | ICD-10-CM | POA: Diagnosis not present

## 2018-07-02 DIAGNOSIS — Z791 Long term (current) use of non-steroidal anti-inflammatories (NSAID): Secondary | ICD-10-CM | POA: Diagnosis not present

## 2018-07-02 DIAGNOSIS — I110 Hypertensive heart disease with heart failure: Secondary | ICD-10-CM | POA: Insufficient documentation

## 2018-07-02 DIAGNOSIS — Z7984 Long term (current) use of oral hypoglycemic drugs: Secondary | ICD-10-CM | POA: Insufficient documentation

## 2018-07-02 DIAGNOSIS — I5030 Unspecified diastolic (congestive) heart failure: Secondary | ICD-10-CM | POA: Diagnosis not present

## 2018-07-02 DIAGNOSIS — C220 Liver cell carcinoma: Secondary | ICD-10-CM | POA: Insufficient documentation

## 2018-07-02 DIAGNOSIS — I4891 Unspecified atrial fibrillation: Secondary | ICD-10-CM | POA: Insufficient documentation

## 2018-07-02 DIAGNOSIS — Z8042 Family history of malignant neoplasm of prostate: Secondary | ICD-10-CM | POA: Insufficient documentation

## 2018-07-02 DIAGNOSIS — Z8052 Family history of malignant neoplasm of bladder: Secondary | ICD-10-CM

## 2018-07-02 DIAGNOSIS — D3501 Benign neoplasm of right adrenal gland: Secondary | ICD-10-CM | POA: Insufficient documentation

## 2018-07-02 DIAGNOSIS — K219 Gastro-esophageal reflux disease without esophagitis: Secondary | ICD-10-CM | POA: Diagnosis not present

## 2018-07-02 DIAGNOSIS — I2510 Atherosclerotic heart disease of native coronary artery without angina pectoris: Secondary | ICD-10-CM | POA: Diagnosis not present

## 2018-07-02 DIAGNOSIS — E785 Hyperlipidemia, unspecified: Secondary | ICD-10-CM | POA: Insufficient documentation

## 2018-07-02 DIAGNOSIS — K7460 Unspecified cirrhosis of liver: Secondary | ICD-10-CM | POA: Diagnosis not present

## 2018-07-02 DIAGNOSIS — Z8546 Personal history of malignant neoplasm of prostate: Secondary | ICD-10-CM | POA: Insufficient documentation

## 2018-07-02 LAB — CBC WITH DIFFERENTIAL (CANCER CENTER ONLY)
Abs Immature Granulocytes: 0.08 10*3/uL — ABNORMAL HIGH (ref 0.00–0.07)
BASOS ABS: 0.1 10*3/uL (ref 0.0–0.1)
Basophils Relative: 0 %
EOS ABS: 0.1 10*3/uL (ref 0.0–0.5)
EOS PCT: 1 %
HCT: 42.5 % (ref 39.0–52.0)
Hemoglobin: 14.6 g/dL (ref 13.0–17.0)
IMMATURE GRANULOCYTES: 1 %
Lymphocytes Relative: 22 %
Lymphs Abs: 2.7 10*3/uL (ref 0.7–4.0)
MCH: 29.7 pg (ref 26.0–34.0)
MCHC: 34.4 g/dL (ref 30.0–36.0)
MCV: 86.4 fL (ref 80.0–100.0)
Monocytes Absolute: 1.4 10*3/uL — ABNORMAL HIGH (ref 0.1–1.0)
Monocytes Relative: 11 %
NEUTROS PCT: 65 %
NRBC: 0 % (ref 0.0–0.2)
Neutro Abs: 7.9 10*3/uL — ABNORMAL HIGH (ref 1.7–7.7)
Platelet Count: 377 10*3/uL (ref 150–400)
RBC: 4.92 MIL/uL (ref 4.22–5.81)
RDW: 12.9 % (ref 11.5–15.5)
WBC: 12.3 10*3/uL — AB (ref 4.0–10.5)

## 2018-07-02 LAB — CMP (CANCER CENTER ONLY)
ALT: 41 U/L (ref 0–44)
ANION GAP: 12 (ref 5–15)
AST: 41 U/L (ref 15–41)
Albumin: 3.7 g/dL (ref 3.5–5.0)
Alkaline Phosphatase: 109 U/L (ref 38–126)
BUN: 13 mg/dL (ref 8–23)
CHLORIDE: 103 mmol/L (ref 98–111)
CO2: 27 mmol/L (ref 22–32)
CREATININE: 1.08 mg/dL (ref 0.61–1.24)
Calcium: 9.5 mg/dL (ref 8.9–10.3)
Glucose, Bld: 122 mg/dL — ABNORMAL HIGH (ref 70–99)
Potassium: 3.8 mmol/L (ref 3.5–5.1)
Sodium: 142 mmol/L (ref 135–145)
Total Bilirubin: 0.7 mg/dL (ref 0.3–1.2)
Total Protein: 6.9 g/dL (ref 6.5–8.1)

## 2018-07-02 NOTE — Telephone Encounter (Signed)
Printed avs calender of upcoming appointment per 2/7 los

## 2018-07-02 NOTE — Progress Notes (Signed)
[place]   [phone] [phone]   Clinic Follow up Note   Patient Care Team: [name], MD as PCP - General (Family Medicine) Stark Klein, MD as Consulting Physician (General Surgery) Truitt Merle, MD as Consulting Physician (Hematology)  Date of Service:  07/02/2018  CHIEF COMPLAINT: F/u of liver cancer  SUMMARY OF ONCOLOGIC HISTORY: Oncology History   Cancer Staging Hepatocellular carcinoma [facility]) Staging form: Liver, AJCC 8th Edition - Clinical stage from 12/22/2017: Stage IB (cT1b, cN0, cM0) - Signed by Truitt Merle, MD on 12/30/2017       Hepatocellular carcinoma ([place])   09/03/2016 Imaging    IMPRESSION: 1. Right lateral sixth and seventh nondisplaced acute rib fractures. No pneumothorax. 2. Otherwise no evidence for acute internal injury or fracture. 3. Stable calcifications in right hilar and mediastinal lymph nodes compatible with sequelae of granulomatous disease. 4. Stable 30 mm right adrenal adenoma. 5. Liver segment 6 indeterminate nodule measuring 24 mm, increased in size from 2017. Further characterization with liver protocol MRI is recommended.    12/11/2017 Imaging    CT AP W WO Contrast 12/11/17  IMPRESSION: 1. Previously noted hypervascular lesion in segment 6 of the liver continues to enlarge in size, now 4.0 x 3.1 x 3.8 cm, and has indeterminate imaging characteristics. Given the background of hepatic steatosis, and subtle changes that are concerning for developing cirrhosis, the possibility of an aggressive lesion such as a primary hepatocellular carcinoma should be considered. Further evaluation for potential biopsy of this lesion is recommended. 2. Right adrenal adenoma.     12/22/2017 Cancer Staging    Staging form: Liver, AJCC 8th Edition - Clinical stage from 12/22/2017: Stage IB (cT1b, cN0, cM0) - Signed by Truitt Merle, MD on 12/30/2017    12/22/2017 Initial Biopsy    Diagnosis 12/22/17  Liver, needle/core biopsy,  Right Lobe - HEPATOCELLULAR CARCINOMA. Microscopic Comment Dr. Vic Ripper has reviewed the case. K. Brigitte Pulse was paged on 12/24/2017.    12/30/2017 Initial Diagnosis    Hepatocellular carcinoma ([place])    12/2017 Imaging    CT Chest WO Contrast 01/12/18  IMPRESSION: 1.  No acute process or evidence of metastatic disease in the chest. 2. Mild motion degradation, primarily inferiorly. 3. Subpleural predominant pulmonary nodules, similar to 09/03/2016 and therefore most consistent with a benign etiology. 4. Mild low right paratracheal adenopathy, also similar and likely reactive. 5. Pulmonary artery enlargement suggests pulmonary arterial hypertension. 6. Coronary artery atherosclerosis. Aortic Atherosclerosis (ICD10-I70.0).    12/30/2017 Tumor Marker    AFP 1.2 at baseline     01/14/2018 Imaging    MRI Abdomen 01/14/18  IMPRESSION: 1. There has been interval growth of the previously noted biopsy proven hepatocellular carcinoma within segment 6 of the liver. Currently 3.9 cm. This is compared with 2.1 cm on 09/28/2016. 2. New hypervascular lesion within the anterior aspect of segment 8 measures 1 cm. This is compatible with LI-RADS 4/5. 3. Diffuse hepatic steatosis with morphologic features suggestive of cirrhosis.    02/15/2018 Procedure    Bland embolization by Dr. Pascal Lux on 02/15/18    06/29/2018 Imaging    MRI Abdomen 06/29/18  IMPRESSION: 1. Very limited examination. Triple phase CT scan may be a better test for follow-up of this patient. 2. Suspect early arterial phase enhancement in and around the segment 6 liver lesion but there is a lot of artifact going right through this area. The lesion is smaller, measuring a maximum of 2 cm (previously 3.9 cm). 3. Small  segment 8 lesion is not identified on this study. No new lesions are identified. 4. Stable right adrenal gland adenoma. 5. Stable cirrhotic changes involving the liver along with steatosis.      CURRENT THERAPY:  Pending  liver ablation   INTERVAL HISTORY:  Steve Jensen is here for a follow up of liver cancer s/p bland ebolization. He has been seeing Dr. Pascal Lux. Today he is here with a family member and is feeling well. When he was having his MRI he couldn't here the nurse well. Next step for him is to get a CT scan and then a liver ablation.  He recovered a couple a days after his procedure but experienced pain during recovery. If the ablation does not work he has decided to stop treatment and just pursue a good quality of life.   REVIEW OF SYSTEMS:   Constitutional: Denies fevers, chills or abnormal weight loss Eyes: Denies blurriness of vision Ears, nose, mouth, throat, and face: Denies mucositis or sore throat Respiratory: Denies cough, dyspnea or wheezes Cardiovascular: Denies palpitation, chest discomfort or lower extremity swelling Gastrointestinal:  Denies nausea, heartburn or change in bowel habits Skin: Denies abnormal skin rashes Lymphatics: Denies new lymphadenopathy or easy bruising Neurological:Denies numbness, tingling or new weaknesses Behavioral/Psych: Mood is stable, no new changes  All other systems were reviewed with the patient and are negative.  MEDICAL HISTORY:  Past Medical History:  Diagnosis Date  . Anemia   . Diabetes mellitus type II, controlled ([place])    with neuropathy  . Dysrhythmia   . GERD (gastroesophageal reflux disease)   . Hyperlipidemia   . Hypertension   . Morbid obesity ([place])   . OSA (obstructive sleep apnea)    On BiPAP at 15/11cm H2O  . Prostate cancer ([place])   . Shingles    on face Nov 2010  . Urinary incontinence     SURGICAL HISTORY: Past Surgical History:  Procedure Laterality Date  . CARDIOVERSION N/A 09/06/2013   Procedure: CARDIOVERSION;  Surgeon: [name], MD;  Location: [facility] ENDOSCOPY;  Service: Cardiovascular;  Laterality: N/A;  . EYE SURGERY     cataract surgery bilat; surgery to correct droopy eyelids   . IR ANGIOGRAM SELECTIVE  EACH ADDITIONAL VESSEL  02/15/2018  . IR ANGIOGRAM SELECTIVE EACH ADDITIONAL VESSEL  02/15/2018  . IR ANGIOGRAM SELECTIVE EACH ADDITIONAL VESSEL  02/15/2018  . IR ANGIOGRAM SELECTIVE EACH ADDITIONAL VESSEL  02/15/2018  . IR ANGIOGRAM SELECTIVE EACH ADDITIONAL VESSEL  02/15/2018  . IR ANGIOGRAM VISCERAL SELECTIVE  02/15/2018  . IR ANGIOGRAM VISCERAL SELECTIVE  02/15/2018  . IR EMBO TUMOR ORGAN ISCHEMIA INFARCT INC GUIDE ROADMAPPING  02/15/2018  . IR RADIOLOGIST EVAL & MGMT  01/26/2018  . IR RADIOLOGIST EVAL & MGMT  03/17/2018  . IR RADIOLOGIST EVAL & MGMT  06/29/2018  . IR US GUIDE VASC ACCESS RIGHT  02/15/2018  . knee surgery bilat     . PROSTATE SURGERY    . right shoulder rotator cuff surgery    . TONSILLECTOMY      I have reviewed the social history and family history with the patient and they are unchanged from previous note.  ALLERGIES:  is allergic to lipitor [atorvastatin]; simvastatin; and zithromax [azithromycin].  MEDICATIONS:  Current Outpatient Medications  Medication Sig Dispense Refill  . diazepam (VALIUM) 5 MG tablet Take 2 tablets (10 mg total) by mouth as directed. Prior to MR.  May take additional 1 tablet (5 mg) by mouth if needed. 3 tablet  0  . diltiazem (CARDIZEM CD) 240 MG 24 hr capsule Take 1 capsule (240 mg total) by mouth daily. Please keep upcoming appt in January with Dr. Irish Lack for future refills. Thank you 90 capsule 0  . ELIQUIS 5 MG TABS tablet TAKE 1 TABLET(5 MG) BY MOUTH TWICE DAILY 180 tablet 1  . fish oil-omega-3 fatty acids 1000 MG capsule Take 2,400 mg by mouth 2 (two) times daily.    . furosemide (LASIX) 40 MG tablet TAKE 1 TABLET BY MOUTH TWICE DAILY AS NEEDED 60 tablet 11  . gabapentin (NEURONTIN) 100 MG capsule Take 100-300 mg by mouth. For feet burning and insomnia  5  . ibuprofen (ADVIL,MOTRIN) 200 MG tablet Take 800 mg by mouth every 6 (six) hours as needed (PAIN). prn    . loratadine (CLARITIN) 10 MG tablet Take 10 mg by mouth daily.    . metFORMIN  (GLUCOPHAGE) 1000 MG tablet Take 1,000 mg by mouth 2 (two) times daily.  0  . metoprolol succinate (TOPROL-XL) 50 MG 24 hr tablet Take 50 mg by mouth daily.     . Multiple Vitamins-Minerals (MULTIVITAMIN WITH MINERALS) tablet Take 1 tablet by mouth daily.    Marland Kitchen omeprazole (PRILOSEC) 20 MG capsule Take 20 mg by mouth daily.     . potassium chloride SA (K-DUR,KLOR-CON) 20 MEQ tablet TAKE 1 TABLET BY MOUTH EVERY DAY AS NEEDED WHILE TAKING FUROSEMIDE 30 tablet 11  . ramipril (ALTACE) 10 MG capsule Take 10 mg by mouth 2 (two) times daily.    . Semaglutide (OZEMPIC) 1 MG/DOSE SOPN Inject 1 mg into the skin once a week. Monday     . ZETIA 10 MG tablet Take 10 mg by mouth daily.      No current facility-administered medications for this visit.     PHYSICAL EXAMINATION: ECOG PERFORMANCE STATUS: 0 - Asymptomatic  Vitals:   07/02/18 1512  BP: 136/88  Pulse: 79  Resp: 17  Temp: 97.9 F (36.6 C)  SpO2: 96%   Filed Weights   07/02/18 1512  Weight: (!) 302 lb (137 kg)    GENERAL:alert, no distress and comfortable SKIN: skin color, texture, turgor are normal, no rashes or significant lesions EYES: normal, Conjunctiva are pink and non-injected, sclera clear OROPHARYNX:no exudate, no erythema and lips, buccal mucosa, and tongue normal  NECK: supple, thyroid normal size, non-tender, without nodularity LYMPH:  no palpable lymphadenopathy in the cervical, axillary or inguinal LUNGS: clear to auscultation and percussion with normal breathing effort HEART: regular rate & rhythm and no murmurs and no lower extremity edema ABDOMEN:abdomen soft, non-tender and normal bowel sounds Musculoskeletal:no cyanosis of digits and no clubbing  NEURO: alert & oriented x 3 with fluent speech, no focal motor/sensory deficits  LABORATORY DATA:  I have reviewed the data as listed CBC Latest Ref Rng & Units 07/02/2018 02/15/2018 12/30/2017  WBC 4.0 - 10.5 K/uL 12.3(H) 10.3 10.9(H)  Hemoglobin 13.0 - 17.0 g/dL 14.6 15.0  15.2  Hematocrit 39.0 - 52.0 % 42.5 43.2 44.7  Platelets 150 - 400 K/uL 377 318 343     CMP Latest Ref Rng & Units 07/02/2018 03/11/2018 02/15/2018  Glucose 70 - 99 mg/dL 122(H) 174(H) 132(H)  BUN 8 - 23 mg/dL 13 12 13   Creatinine 0.61 - 1.24 mg/dL 1.08 0.95 1.01  Sodium 135 - 145 mmol/L 142 140 144  Potassium 3.5 - 5.1 mmol/L 3.8 4.1 3.6  Chloride 98 - 111 mmol/L 103 101 102  CO2 22 - 32 mmol/L 27 25  29  Calcium 8.9 - 10.3 mg/dL 9.5 10.0 9.5  Total Protein 6.5 - 8.1 g/dL 6.9 6.4 7.2  Total Bilirubin 0.3 - 1.2 mg/dL 0.7 0.3 0.7  Alkaline Phos 38 - 126 U/L 109 - 91  AST 15 - 41 U/L 41 44(H) 37  ALT 0 - 44 U/L 41 42 42      RADIOGRAPHIC STUDIES: I have personally reviewed the radiological images as listed and agreed with the findings in the report. No results found.   ASSESSMENT & PLAN:  Steve Jensen is a 78 y.o. male with   1.  Hepatocellular carcinoma, cT1bN0M0, stage IB -He was diagnosed in 11/2017. He was evaluated by surgeon Dr. Barry Dienes, and felt not to be a candidate for surgical resection.  He is status post bland embolization by Dr. Pascal Lux on 02/15/18.  -I reviewed his restaging abdominal MRI, which showed good partial response to treatment, the treatment segment 6 lesion measures about 2cm now, although the images are degraded due to motions. The previous small lesion in segment 8 was not seen on this study. -He is going to have a CT abdomen with liver protocol for further evaluation. -Dr. Pascal Lux has recommended liver ablation to the segment 6 lesion, with curative intent.  -He will continue follow-up with Dr. Pascal Lux.  -There is no role for systemic therapy at this point.  I will see him back in 1 year, or sooner if needed.   2. H/o Prostate cancer, 1995 -treated by surgery only, get regular PSA labs  -His brother had prostate cancer and his other brother had bladder cancer.    3. Left rotator cuff injury, with pain  -His orthopedic surgeon wants to wait until he  completes liver cancer treatment before proceeding with surgery to repair. Pt is agreeable with that.  -Oxycodone does not work for his pain   4. HTN, DM, AF, diastolic congestive heart failure, morbid obesity -Follow-up with his primary care physician  5. Early liver cirrhosis -He has no known liver disease, hepatitis B S antigen, and hepatitis C antibody negative -Follow-up with Dr. Earlean Shawl    PLAN: -lab reviewed  - I will call him back next week with his tumor marker results  - Lab with f/u in a year  -he is going to have [facility] ablation with Dr. Pascal Lux in near future and f/u with Dr. Pascal Lux   No problem-specific Assessment & Plan notes found for this encounter.   No orders of the defined types were placed in this encounter.  All questions were answered. The patient knows to call the clinic with any problems, questions or concerns. No barriers to learning was detected. I spent 20 minutes counseling the patient face to face. The total time spent in the appointment was 25 minutes and more than 50% was on counseling and review of test results     Truitt Merle, MD 07/02/2018   I, Manson Allan, am acting as scribe for Truitt Merle, MD.   I have reviewed the above documentation for accuracy and completeness, and I agree with the above.

## 2018-07-03 ENCOUNTER — Encounter: Payer: Self-pay | Admitting: Hematology

## 2018-07-03 LAB — AFP TUMOR MARKER: AFP, SERUM, TUMOR MARKER: 2.2 ng/mL (ref 0.0–8.3)

## 2018-07-05 ENCOUNTER — Telehealth: Payer: Self-pay

## 2018-07-05 NOTE — Telephone Encounter (Signed)
-----   Message from Truitt Merle, MD sent at 07/03/2018  3:45 PM EST ----- Please let pt know his AFP was normal from yesterday, thanks  Truitt Merle  07/03/2018

## 2018-07-05 NOTE — Telephone Encounter (Signed)
Left voice message for patient regarding lab results.  Per Dr. Burr Medico AFP tumor marker was normal.

## 2018-07-06 ENCOUNTER — Other Ambulatory Visit: Payer: Self-pay | Admitting: Interventional Radiology

## 2018-07-06 DIAGNOSIS — C220 Liver cell carcinoma: Secondary | ICD-10-CM

## 2018-07-07 ENCOUNTER — Other Ambulatory Visit: Payer: Self-pay | Admitting: Radiology

## 2018-07-07 DIAGNOSIS — C220 Liver cell carcinoma: Secondary | ICD-10-CM

## 2018-07-27 ENCOUNTER — Encounter (HOSPITAL_COMMUNITY): Payer: Self-pay

## 2018-07-27 ENCOUNTER — Ambulatory Visit
Admission: RE | Admit: 2018-07-27 | Discharge: 2018-07-27 | Disposition: A | Discharge status: Home / Self Care | Payer: Medicare Other | Source: Ambulatory Visit | Attending: Interventional Radiology | Admitting: Interventional Radiology

## 2018-07-27 ENCOUNTER — Ambulatory Visit (HOSPITAL_COMMUNITY)
Admission: RE | Admit: 2018-07-27 | Discharge: 2018-07-27 | Disposition: A | Discharge status: Home / Self Care | Payer: Medicare Other | Source: Ambulatory Visit | Attending: Interventional Radiology | Admitting: Interventional Radiology

## 2018-07-27 DIAGNOSIS — C220 Liver cell carcinoma: Secondary | ICD-10-CM | POA: Insufficient documentation

## 2018-07-27 HISTORY — PX: IR RADIOLOGIST EVAL & MGMT: IMG5224

## 2018-07-27 MED ORDER — IOHEXOL 300 MG/ML  SOLN
100.0000 mL | Freq: Once | INTRAMUSCULAR | Status: AC | PRN
  Administered 2018-07-27: 100 mL via INTRAVENOUS

## 2018-07-27 NOTE — Progress Notes (Signed)
Patient ID: Steve Jensen, male   DOB: [birth year], 78 y.o.   MRN: [id number]         Chief Complaint: Post bland embolization of biopsy-proven hepatocellular carcinoma  Referring Physician(s): Burr Medico (Oncology)  History of Present Illness: Steve Jensen is a 78 y.o. male with past medical history significant for type 2 diabetes, hypertension, hyperlipidemia, morbid obesity with obstructive sleep apnea and prostate cancer who returns today to the interventional radiology clinic following technically successful bland embolization of biopsy-proven hepatocellular carcinoma within the subcapsular aspect of the right lobe of the liver. The patient is accompanied by a family member though serves as his own historian.  Patient was most recently seen in consultation on 06/29/2018 following the acquisition of an abdominal MRI.  The abdominal MRI suggested response to treatment following bland embolization, however the examination was limited due to patient respiratory artifact.  As such, the patient returns to the interventional radiology clinic following acquisition of cirrhotic protocol CT scan which confirmed this finding and was also negative for any new definitive liver lesions with special attention paid to the question ill-defined lesion within segment 8 on remote abdominal MRI performed 12/2017.  The patient is again without complaint and is interested in pursuing all available treatment options.     Past Medical History:  Diagnosis Date  . Anemia   . Diabetes mellitus type II, controlled ([place])    with neuropathy  . Dysrhythmia   . GERD (gastroesophageal reflux disease)   . Hyperlipidemia   . Hypertension   . Morbid obesity ([place])   . OSA (obstructive sleep apnea)    On BiPAP at 15/11cm H2O  . Prostate cancer ([place])   . Shingles    on face Nov 2010  . Urinary incontinence     Past Surgical History:  Procedure Laterality Date  . CARDIOVERSION N/A 09/06/2013   Procedure:  CARDIOVERSION;  Surgeon: [name], MD;  Location: [facility] ENDOSCOPY;  Service: Cardiovascular;  Laterality: N/A;  . EYE SURGERY     cataract surgery bilat; surgery to correct droopy eyelids   . IR ANGIOGRAM SELECTIVE EACH ADDITIONAL VESSEL  02/15/2018  . IR ANGIOGRAM SELECTIVE EACH ADDITIONAL VESSEL  02/15/2018  . IR ANGIOGRAM SELECTIVE EACH ADDITIONAL VESSEL  02/15/2018  . IR ANGIOGRAM SELECTIVE EACH ADDITIONAL VESSEL  02/15/2018  . IR ANGIOGRAM SELECTIVE EACH ADDITIONAL VESSEL  02/15/2018  . IR ANGIOGRAM VISCERAL SELECTIVE  02/15/2018  . IR ANGIOGRAM VISCERAL SELECTIVE  02/15/2018  . IR EMBO TUMOR ORGAN ISCHEMIA INFARCT INC GUIDE ROADMAPPING  02/15/2018  . IR RADIOLOGIST EVAL & MGMT  01/26/2018  . IR RADIOLOGIST EVAL & MGMT  03/17/2018  . IR RADIOLOGIST EVAL & MGMT  06/29/2018  . IR US GUIDE VASC ACCESS RIGHT  02/15/2018  . knee surgery bilat     . PROSTATE SURGERY    . right shoulder rotator cuff surgery    . TONSILLECTOMY      Allergies: Lipitor [atorvastatin]; Simvastatin; and Zithromax [azithromycin]  Medications: Prior to Admission medications   Medication Sig Start Date End Date Taking? Authorizing Provider  diazepam (VALIUM) 5 MG tablet Take 2 tablets (10 mg total) by mouth as directed. Prior to MR.  May take additional 1 tablet (5 mg) by mouth if needed. 06/29/18   Sandi Mariscal, MD  diltiazem (CARDIZEM CD) 240 MG 24 hr capsule Take 1 capsule (240 mg total) by mouth daily. Please keep upcoming appt in January with Dr. Irish Lack for future refills. Thank you 05/13/18  [name], MD  ELIQUIS 5 MG TABS tablet TAKE 1 TABLET(5 MG) BY MOUTH TWICE DAILY 05/20/18   [name], MD  fish oil-omega-3 fatty acids 1000 MG capsule Take 2,400 mg by mouth 2 (two) times daily.    [provider]  furosemide (LASIX) 40 MG tablet TAKE 1 TABLET BY MOUTH TWICE DAILY AS NEEDED 06/18/18   [name], MD  gabapentin (NEURONTIN) 100 MG capsule Take 100-300 mg by mouth.  For feet burning and insomnia 02/13/18   [provider]  ibuprofen (ADVIL,MOTRIN) 200 MG tablet Take 800 mg by mouth every 6 (six) hours as needed (PAIN). prn 06/30/13   [provider]  loratadine (CLARITIN) 10 MG tablet Take 10 mg by mouth daily.    [provider]  metFORMIN (GLUCOPHAGE) 1000 MG tablet Take 1,000 mg by mouth 2 (two) times daily. 06/27/14   [provider]  metoprolol succinate (TOPROL-XL) 50 MG 24 hr tablet Take 50 mg by mouth daily.  08/10/13   [provider]  Multiple Vitamins-Minerals (MULTIVITAMIN WITH MINERALS) tablet Take 1 tablet by mouth daily.    [provider]  omeprazole (PRILOSEC) 20 MG capsule Take 20 mg by mouth daily.  11/29/12   [provider]  potassium chloride SA (K-DUR,KLOR-CON) 20 MEQ tablet TAKE 1 TABLET BY MOUTH EVERY DAY AS NEEDED WHILE TAKING FUROSEMIDE 06/18/18   [name], MD  ramipril (ALTACE) 10 MG capsule Take 10 mg by mouth 2 (two) times daily. 01/11/13   [provider]  Semaglutide (OZEMPIC) 1 MG/DOSE SOPN Inject 1 mg into the skin once a week. Monday     [provider]  ZETIA 10 MG tablet Take 10 mg by mouth daily.  01/17/13   [provider]     Family History  Problem Relation Age of Onset  . Hypertension Brother   . Diabetes Brother   . Cancer Brother        bladder cancer  . Diabetes Brother   . CVA Brother   . Hypertension Brother   . Prostate cancer Brother   . Diabetes Brother   . Hypertension Brother   . Heart disease Brother        CABG  . Heart attack Brother   . Stroke Brother     Social History   Socioeconomic History  . Marital status: Married    Spouse name: Not on file  . Number of children: Not on file  . Years of education: Not on file  . Highest education level: Not on file  Occupational History  . Not on file  Social Needs  . Financial resource strain: Not on file  . Food insecurity:    Worry: Not on file     Inability: Not on file  . Transportation needs:    Medical: Not on file    Non-medical: Not on file  Tobacco Use  . Smoking status: Former Smoker    Packs/day: 1.00    Years: 15.00    Pack years: 15.00    Last attempt to quit: 05/27/1975    Years since quitting: 43.1  . Smokeless tobacco: Never Used  Substance and Sexual Activity  . Alcohol use: No  . Drug use: No  . Sexual activity: Not on file  Lifestyle  . Physical activity:    Days per week: Not on file    Minutes per session: Not on file  . Stress: Not on file  Relationships  .  Social connections:    Talks on phone: Not on file    Gets together: Not on file    Attends religious service: Not on file    Active member of club or organization: Not on file    Attends meetings of clubs or organizations: Not on file    Relationship status: Not on file  Other Topics Concern  . Not on file  Social History Narrative  . Not on file    ECOG Status: 0 - Asymptomatic  Review of Systems: A 12 point ROS discussed and pertinent positives are indicated in the HPI above.  All other systems are negative.  Review of Systems  Constitutional: Negative for activity change and fatigue.  Respiratory: Negative.   Cardiovascular: Negative.   Gastrointestinal: Negative.   Genitourinary: Negative.   Musculoskeletal: Negative.     Vital Signs: BP 139/71   Pulse 72   Temp 98.1 F (36.7 C) (Oral)   Resp 17   Ht 5\' 11"  (1.803 m)   Wt 136.1 kg   SpO2 95%   BMI 41.84 kg/m   Physical Exam  Mallampati Score:     Imaging: Mr Abdomen Wwo Contrast  Result Date: 06/29/2018 CLINICAL DATA:  Follow-up hepatocellular carcinoma. Status post bland embolization of [facility] 02/15/2018. EXAM: MRI ABDOMEN WITHOUT AND WITH CONTRAST TECHNIQUE: Multiplanar multisequence MR imaging of the abdomen was performed both before and after the administration of intravenous contrast. CONTRAST:  10mL EOVIST GADOXETATE DISODIUM 0.25 MOL/L IV SOLN COMPARISON:  MRI  01/13/2018 FINDINGS: Examination is extremely limited due to breathing motion artifact. Probably not worth getting any future MRIs for this patient. Triple phase CT may be a much better study in this situation. Lower chest: Grossly clear. Hepatobiliary: Stable cirrhotic changes involving the liver along with steatosis. Images are severely degraded but the segment 6 liver lesion is much smaller. On the early arterial phase images there does appear to be some enhancement in or around the lesion but I do not see any obvious enhancement on the subsequent sequences. It measures a maximum of 2 cm on series 10, image 70. It previously measured over 3 cm. On the prior MRI there was a small early arterial phase enhancing lesion in segment 8. I do not see this for certain on today's exam. No biliary dilatation. The gallbladder is grossly normal. No common bile duct dilatation. Pancreas:  No mass, inflammation or ductal dilatation is identified. Spleen:  Normal size.  No focal lesions. Adrenals/Urinary Tract: Stable right adrenal gland adenoma. The left adrenal gland is normal. No worrisome renal lesions or hydronephrosis. Stable renal cysts. Stomach/Bowel: Visualized portions within the abdomen are unremarkable. Vascular/Lymphatic: No pathologically enlarged lymph nodes identified. No abdominal aortic aneurysm demonstrated. Other:  No ascites or abdominal wall hernia. Musculoskeletal: No significant findings. IMPRESSION: 1. Very limited examination. Triple phase CT scan may be a better test for follow-up of this patient. 2. Suspect early arterial phase enhancement in and around the segment 6 liver lesion but there is a lot of artifact going right through this area. The lesion is smaller, measuring a maximum of 2 cm (previously 3.9 cm). 3. Small segment 8 lesion is not identified on this study. No new lesions are identified. 4. Stable right adrenal gland adenoma. 5. Stable cirrhotic changes involving the liver along with  steatosis. Electronically Signed   By: Marijo Sanes M.D.   On: 06/29/2018 10:36   Ct Abdomen W Wo Contrast  Result Date: 07/27/2018 CLINICAL DATA:  Hepatocellular carcinoma. Status  post bland embolization. EXAM: CT ABDOMEN WITHOUT AND WITH CONTRAST TECHNIQUE: Multidetector CT imaging of the abdomen was performed following the standard protocol before and following the bolus administration of intravenous contrast. CONTRAST:  1102mL OMNIPAQUE IOHEXOL 300 MG/ML  SOLN COMPARISON:  06/29/2018 FINDINGS: Lower chest: Scarring is identified within the right base. No acute abnormality. Hepatobiliary: The arterial phase enhancing lesion within segment 6 of the liver is again noted. On today's exam this measures 3 x 2.3 by 1.9 cm (volume = 7 cm^3), image 58/4. Previously 4.0 x 3.1 by 3.8 cm (volume = 25 cm^3), Image 58/4. No new hypervascular liver lesions identified. Tiny stones are noted layering within the dependent portion of the gallbladder. No biliary dilatation. Pancreas: Unremarkable. No pancreatic ductal dilatation or surrounding inflammatory changes. Spleen: Normal in size without focal abnormality. Adrenals/Urinary Tract: Normal appearance of the left adrenal gland. Right adrenal nodule is again noted measuring 3.2 cm, unchanged from previous exam compatible with a benign adenoma. No kidney mass or hydronephrosis. Stomach/Bowel: Small hiatal hernia. The small bowel loops have a normal course and caliber without obstruction. The visualized portions of the colon appear normal. Vascular/Lymphatic: Aortic atherosclerosis. Focal infrarenal abdominal aortic aneurysm measures 3.1 cm, image 72/9. No abdominal adenopathy. Other: No free fluid or fluid collections. Musculoskeletal: Chronic healed right lateral rib deformities are identified. IMPRESSION: 1. Interval decrease in volume of segment 6 arterial phase enhancing lesion. Currently this measures approximately 7 cc versus 25 cc previously. No new lesion. 2. Aortic  atherosclerosis with infrarenal abdominal aortic aneurysm. Recommend followup by [place] in 3 years. This recommendation follows ACR consensus guidelines: White Paper of the ACR Incidental Findings Committee II on Vascular Findings. J Am Coll Radiol 2013; 10:272-536 3.  Aortic Atherosclerosis (ICD10-I70.0). Electronically Signed   By: Kerby Moors M.D.   On: 07/27/2018 15:43   Ir Radiologist Eval & Mgmt  Result Date: 06/29/2018 Please refer to notes tab for details about interventional procedure. (Op Note)   Labs:  CBC: Recent Labs    12/22/17 0740 12/30/17 1606 02/15/18 1250 07/02/18 1448  WBC 10.3 10.9* 10.3 12.3*  HGB 15.1 15.2 15.0 14.6  HCT 44.0 44.7 43.2 42.5  PLT 274 343 318 377    COAGS: Recent Labs    12/22/17 0615 12/30/17 1606 02/15/18 1250  INR 0.99 1.08 0.95    BMP: Recent Labs    12/30/17 1606 02/15/18 1250 03/11/18 1345 07/02/18 1448  NA 140 144 140 142  K 3.8 3.6 4.1 3.8  CL 100 102 101 103  CO2 27 29 25 27   GLUCOSE 126* 132* 174* 122*  BUN 11 13 12 13   CALCIUM 9.8 9.5 10.0 9.5  CREATININE 1.08 1.01 0.95 1.08  GFRNONAA >60 >60 77 >60  GFRAA >60 >60 90 >60    LIVER FUNCTION TESTS: Recent Labs    12/30/17 1606 02/15/18 1250 03/11/18 1345 07/02/18 1448  BILITOT 0.5 0.7 0.3 0.7  AST 44* 37 44* 41  ALT 59* 42 42 41  ALKPHOS 105 91  --  109  PROT 7.3 7.2 6.4 6.9  ALBUMIN 3.9 4.0  --  3.7    TUMOR MARKERS: No results for input(s): AFPTM, CEA, CA199, CHROMGRNA in the last 8760 hours.  Assessment and Plan:  Steve Jensen is a 78 y.o. male with past medical history significant for type 2 diabetes, hypertension, hyperlipidemia, morbid obesity with obstructive sleep apnea and prostate cancer who returns today to the interventional radiology clinic following technically successful bland embolization of biopsy-proven  hepatocellular carcinoma within the subcapsular aspect of the right lobe of the liver.   Patient was most recently seen in  consultation on 06/29/2018 following the acquisition of an abdominal MRI.  The abdominal MRI suggested response to treatment following bland embolization, however the examination was limited due to patient respiratory artifact.  As such, the patient returns to the interventional radiology clinic following acquisition of cirrhotic protocol CT scan which confirmed this finding and was also negative for any new definitive liver lesions with special attention paid to the question ill-defined lesion within segment 8 on remote abdominal MRI performed 12/2017.  Selected images from preprocedural CT of the abdomen pelvis performed 12/10/2017 and abdominal MRI performed 01/13/2018 as well as postprocedural abdominal MRI performed 06/29/2018 and most recent cirrhotic protocol CT scan of the abdomen performed earlier today reviewed in detail with the patient and the patient's family member.  Given confirmed reduction in size of the lesion within the subcapsular aspect of the right lobe of the liver the patient is now a candidate for attempted percutaneous microwave ablation.  (Note, the patient has been previously evaluated by surgery and was deemed a nonoperative candidate given his comorbidities.)  As such, prolonged conversations were held with the patient regarding the benefits and risks (including but not limited to, bleeding, infection, postprocedural pain, tract seeding and incomplete ablation) of image guided microwave ablation.    Additionally I explained that at the present time there are no additional sites of disease with special attention paid to the questioned tiny lesion within segment 8 on remote abdominal MRI performed 12/2017, though given his advanced hepatic steatosis, he is at risk for developing additional sites of hepatocellular carcinoma which will not be treated with this targeted ablation.  Following this prolonged a detailed conversation, the patient does wish to pursue image guided hepatic  microwave ablation.  As such, we will proceed with arranging for this procedure to be performed with general anesthesia at [facility].    The procedure will entail an overnight admission for continued observation and PCA usage and will again require he hold his Eliquis for 48 hours prior to the procedure.   The patient knows to call the interventional radiology clinic with any interval questions or concerns.  Thank you for this interesting consult.  I greatly enjoyed meeting Steve Jensen and look forward to participating in their care.  A copy of this report was sent to the requesting provider on this date.  Electronically Signed: Sandi Mariscal 07/27/2018, 5:13 PM   I spent a total of 15 Minutes in face to face in clinical consultation, greater than 50% of which was counseling/coordinating care for biopsy-proven hepatocellular carcinoma

## 2018-07-29 ENCOUNTER — Encounter: Payer: Self-pay | Admitting: *Deleted

## 2018-07-29 ENCOUNTER — Other Ambulatory Visit (HOSPITAL_COMMUNITY): Payer: Self-pay | Admitting: Interventional Radiology

## 2018-07-29 DIAGNOSIS — C220 Liver cell carcinoma: Secondary | ICD-10-CM

## 2018-08-02 ENCOUNTER — Other Ambulatory Visit: Payer: Self-pay

## 2018-08-02 MED ORDER — DILTIAZEM HCL ER COATED BEADS 240 MG PO CP24: 240.0000 mg | ORAL_CAPSULE | Freq: Every day | ORAL | 2 refills | Status: DC

## 2018-08-11 ENCOUNTER — Telehealth: Payer: Self-pay | Admitting: Interventional Radiology

## 2018-08-11 NOTE — Telephone Encounter (Signed)
Given current healthcare crisis re the COVID-19 virus, I called Mr. Fickle and discussed his willingness to postpone his scheduled microwave ablation currently scheduled on April 1st.  I explained there is a small chance his disease could progress in the interval, though I felt it was a greater risk if he were to be exposed to the virus while recovering from the ablation in the hospital.  The patient is in agreement, and as such we will post pone his procedure until we return to normal scheduling.  The patient knows to call the IR clinic with any interval questions or concerns.  Ronny Bacon, MD Pager #: [phone]

## 2018-08-25 ENCOUNTER — Ambulatory Visit (HOSPITAL_COMMUNITY)
Admission: RE | Admit: 2018-08-25 | Payer: Medicare Other | Source: Home / Self Care | Admitting: Interventional Radiology

## 2018-08-25 ENCOUNTER — Encounter (HOSPITAL_COMMUNITY): Admission: RE | Payer: Self-pay | Source: Home / Self Care

## 2018-08-25 ENCOUNTER — Inpatient Hospital Stay (HOSPITAL_COMMUNITY): Admission: RE | Admit: 2018-08-25 | Payer: Medicare Other | Source: Ambulatory Visit

## 2018-08-25 SURGERY — CT WITH ANESTHESIA
Anesthesia: General

## 2018-10-05 NOTE — Progress Notes (Signed)
Virtual Visit via Video Note   This visit type was conducted due to national recommendations for restrictions regarding the COVID-19 Pandemic (e.g. social distancing) in an effort to limit this patient's exposure and mitigate transmission in our community.  Due to his co-morbid illnesses, this patient is at least at moderate risk for complications without adequate follow up.  This format is felt to be most appropriate for this patient at this time.  All issues noted in this document were discussed and addressed.  A limited physical exam was performed with this format.  Please refer to the patient's chart for his consent to telehealth for [facility].   Evaluation Performed:  Follow-up visit  This visit type was conducted due to national recommendations for restrictions regarding the COVID-19 Pandemic (e.g. social distancing).  This format is felt to be most appropriate for this patient at this time.  All issues noted in this document were discussed and addressed.  No physical exam was performed (except for noted visual exam findings with Video Visits).  Please refer to the patient's chart (MyChart message for video visits and phone note for telephone visits) for the patient's consent to telehealth for [facility].  Date:  10/06/2018   ID:  Steve Jensen, DOB May 10, 1941, MRN [id number]  Patient Location:  Home  Provider location:   [place]  PCP:  [name], MD  Sleep Medicine:  Fransico Him, ND Electrophysiologist:  None   Chief Complaint:  OSA  History of Present Illness:    Steve Jensen is a 78 y.o. male who presents via audio/video conferencing for a telehealth visit today.    Steve Jensen is a 78 y.o. male with a hx of OSA on BiPAP, morbid obesity and HTN.  He is doing well with his CPAP device and thinks that He has gotten used to it.  He tolerates the mask and feels the pressure is adequate.  Since going on CPAP he feels rested in the am and has no significant  daytime sleepiness.  He denies any significant mouth or nasal dryness or nasal congestion.  He does not think that he snores.    The patient does not have symptoms concerning for COVID-19 infection (fever, chills, cough, or new shortness of breath).   Prior CV studies:   The following studies were reviewed today:  PAP download  Past Medical History:  Diagnosis Date  . Anemia   . Diabetes mellitus type II, controlled ([place])    with neuropathy  . Dysrhythmia   . GERD (gastroesophageal reflux disease)   . Hyperlipidemia   . Hypertension   . Morbid obesity ([place])   . OSA (obstructive sleep apnea)    On BiPAP at 15/11cm H2O  . Prostate cancer ([place])   . Shingles    on face Nov 2010  . Urinary incontinence    Past Surgical History:  Procedure Laterality Date  . CARDIOVERSION N/A 09/06/2013   Procedure: CARDIOVERSION;  Surgeon: [name], MD;  Location: [facility] ENDOSCOPY;  Service: Cardiovascular;  Laterality: N/A;  . EYE SURGERY     cataract surgery bilat; surgery to correct droopy eyelids   . IR ANGIOGRAM SELECTIVE EACH ADDITIONAL VESSEL  02/15/2018  . IR ANGIOGRAM SELECTIVE EACH ADDITIONAL VESSEL  02/15/2018  . IR ANGIOGRAM SELECTIVE EACH ADDITIONAL VESSEL  02/15/2018  . IR ANGIOGRAM SELECTIVE EACH ADDITIONAL VESSEL  02/15/2018  . IR ANGIOGRAM SELECTIVE EACH ADDITIONAL VESSEL  02/15/2018  . IR ANGIOGRAM VISCERAL SELECTIVE  02/15/2018  .  IR ANGIOGRAM VISCERAL SELECTIVE  02/15/2018  . IR EMBO TUMOR ORGAN ISCHEMIA INFARCT INC GUIDE ROADMAPPING  02/15/2018  . IR RADIOLOGIST EVAL & MGMT  01/26/2018  . IR RADIOLOGIST EVAL & MGMT  03/17/2018  . IR RADIOLOGIST EVAL & MGMT  06/29/2018  . IR RADIOLOGIST EVAL & MGMT  07/27/2018  . IR US GUIDE VASC ACCESS RIGHT  02/15/2018  . knee surgery bilat     . PROSTATE SURGERY    . right shoulder rotator cuff surgery    . TONSILLECTOMY       Current Meds  Medication Sig  . diazepam (VALIUM) 5 MG tablet Take 2 tablets (10 mg total) by mouth as directed.  Prior to MR.  May take additional 1 tablet (5 mg) by mouth if needed.  . diltiazem (CARDIZEM CD) 240 MG 24 hr capsule Take 1 capsule (240 mg total) by mouth daily. Please keep upcoming appt in January with Dr. Irish Lack for future refills. Thank you  . ELIQUIS 5 MG TABS tablet TAKE 1 TABLET(5 MG) BY MOUTH TWICE DAILY  . fish oil-omega-3 fatty acids 1000 MG capsule Take 2,400 mg by mouth 2 (two) times daily.  . furosemide (LASIX) 40 MG tablet TAKE 1 TABLET BY MOUTH TWICE DAILY AS NEEDED  . ibuprofen (ADVIL,MOTRIN) 200 MG tablet Take 800 mg by mouth every 6 (six) hours as needed (PAIN). prn  . loratadine (CLARITIN) 10 MG tablet Take 10 mg by mouth daily.  . metFORMIN (GLUCOPHAGE) 1000 MG tablet Take 1,000 mg by mouth 2 (two) times daily.  . metoprolol succinate (TOPROL-XL) 50 MG 24 hr tablet Take 50 mg by mouth daily.   . Multiple Vitamins-Minerals (MULTIVITAMIN WITH MINERALS) tablet Take 1 tablet by mouth daily.  Marland Kitchen omeprazole (PRILOSEC) 20 MG capsule Take 20 mg by mouth daily.   . potassium chloride SA (K-DUR,KLOR-CON) 20 MEQ tablet TAKE 1 TABLET BY MOUTH EVERY DAY AS NEEDED WHILE TAKING FUROSEMIDE  . pregabalin (LYRICA) 50 MG capsule Take 2 capsules by mouth 2 (two) times a day.  . ramipril (ALTACE) 10 MG capsule Take 10 mg by mouth 2 (two) times daily.  . Semaglutide (OZEMPIC) 1 MG/DOSE SOPN Inject 1 mg into the skin once a week. Monday   . ZETIA 10 MG tablet Take 10 mg by mouth daily.      Allergies:   Lipitor [atorvastatin]; Simvastatin; and Zithromax [azithromycin]   Social History   Tobacco Use  . Smoking status: Former Smoker    Packs/day: 1.00    Years: 15.00    Pack years: 15.00    Last attempt to quit: 05/27/1975    Years since quitting: 43.3  . Smokeless tobacco: Never Used  Substance Use Topics  . Alcohol use: No  . Drug use: No     Family Hx: The patient's family history includes CVA in his brother; Cancer in his brother; Diabetes in his brother, brother, and brother;  Heart attack in his brother; Heart disease in his brother; Hypertension in his brother, brother, and brother; Prostate cancer in his brother; Stroke in his brother.  ROS:   Please see the history of present illness.     All other systems reviewed and are negative.   Labs/Other Tests and Data Reviewed:    Recent Labs: 07/02/2018: ALT 41; BUN 13; Creatinine 1.08; Hemoglobin 14.6; Platelet Count 377; Potassium 3.8; Sodium 142   Recent Lipid Panel Lab Results  Component Value Date/Time   CHOL 158 12/18/2014 10:39 AM   TRIG 188.0 (H)  12/18/2014 10:39 AM   HDL 31.00 (L) 12/18/2014 10:39 AM   CHOLHDL 5 12/18/2014 10:39 AM   LDLCALC 89 12/18/2014 10:39 AM    Wt Readings from Last 3 Encounters:  10/06/18 293 lb (132.9 kg)  07/27/18 300 lb (136.1 kg)  07/02/18 (!) 302 lb (137 kg)     Objective:    Vital Signs:  BP 132/63 (BP Location: Left Arm, Patient Position: Sitting, Cuff Size: Normal)   Pulse 76   Ht 5\' 11"  (1.803 m)   Wt 293 lb (132.9 kg)   BMI 40.87 kg/m     ASSESSMENT & PLAN:    1.  OSA - the patient is tolerating PAP therapy well without any problems. The PAP download was reviewed today and showed an AHI of 0.3/hr on 15/11 cm H2O with 100% compliance in using more than 4 hours nightly.  The patient has been using and benefiting from PAP use and will continue to benefit from therapy.   2.  Hypertension - His BP is well controlled on exam.  He will continue on Ramipril 10mg  BID, Toprol XL 50mg  daily and Cardizem CD 240mg  daily.   His creatinine was stable at 1.08 on 06/2018.  3.  Morbid Obesity -He is unable to exercise due to severe peripheral neuropathy.   4.  COVID-19 Education: The signs and symptoms of COVID-19 were discussed with the patient and how to seek care for testing (follow up with PCP or arrange E-visit).  The importance of social distancing was discussed today.  Patient Risk:   After full review of this patient's clinical status, I feel that they are at  least moderate risk at this time.  Time:   Today, I have spent 10 minutes directly with the patient on video discussing medical problems including OSA, HTN, obesity.  We also reviewed the symptoms of COVID 19 and the ways to protect against contracting the virus with telehealth technology.  I spent an additional 5 minutes reviewing patient's chart including PAP compliance download.  Medication Adjustments/Labs and Tests Ordered: Current medicines are reviewed at length with the patient today.  Concerns regarding medicines are outlined above.  Tests Ordered: No orders of the defined types were placed in this encounter.  Medication Changes: No orders of the defined types were placed in this encounter.   Disposition:  Follow up in 1 year(s)  Signed, Fransico Him, MD  10/06/2018 8:56 AM    [facility]

## 2018-10-06 ENCOUNTER — Other Ambulatory Visit: Payer: Self-pay

## 2018-10-06 ENCOUNTER — Telehealth (INDEPENDENT_AMBULATORY_CARE_PROVIDER_SITE_OTHER): Payer: Medicare Other | Admitting: Cardiology

## 2018-10-06 ENCOUNTER — Telehealth: Payer: Self-pay | Admitting: *Deleted

## 2018-10-06 ENCOUNTER — Encounter: Payer: Self-pay | Admitting: Cardiology

## 2018-10-06 VITALS — BP 132/63 | HR 76 | Ht 71.0 in | Wt 293.0 lb

## 2018-10-06 DIAGNOSIS — G4733 Obstructive sleep apnea (adult) (pediatric): Secondary | ICD-10-CM | POA: Diagnosis not present

## 2018-10-06 DIAGNOSIS — I10 Essential (primary) hypertension: Secondary | ICD-10-CM | POA: Diagnosis not present

## 2018-10-06 NOTE — Telephone Encounter (Signed)
Order placed to [facility] for Supplies only order

## 2018-10-06 NOTE — Patient Instructions (Signed)
Medication Instructions:  Your provider recommends that you continue on your current medications as directed. Please refer to the Current Medication list given to you today.    Follow-Up: Your provider wants you to follow-up in: 1 year with Dr. Radford Pax. You will receive a reminder letter in the mail two months in advance. If you don't receive a letter, please call our office to schedule the follow-up appointment.    Any Other Special Instructions Will Be Listed Below (If Applicable). New PAP supplies have been ordered for you.

## 2018-10-06 NOTE — Telephone Encounter (Signed)
-----   Message from Theodoro Parma, RN sent at 10/06/2018  9:50 AM EDT ----- Regarding: OSA order Order is in. Please notify the patient's DME. Thanks! ----- Message ----- From: [name], MD Sent: 10/06/2018   9:02 AM EDT To: Theodoro Parma, RN  Order new PAP supplies.  Followup with me in 1 year.

## 2018-10-19 ENCOUNTER — Other Ambulatory Visit: Payer: Self-pay | Admitting: Interventional Radiology

## 2018-10-19 ENCOUNTER — Other Ambulatory Visit: Payer: Self-pay | Admitting: *Deleted

## 2018-10-19 DIAGNOSIS — C220 Liver cell carcinoma: Secondary | ICD-10-CM

## 2018-10-25 ENCOUNTER — Ambulatory Visit (HOSPITAL_COMMUNITY)
Admission: RE | Admit: 2018-10-25 | Discharge: 2018-10-25 | Disposition: A | Discharge status: Home / Self Care | Payer: Medicare Other | Source: Ambulatory Visit | Attending: Interventional Radiology | Admitting: Interventional Radiology

## 2018-10-25 ENCOUNTER — Other Ambulatory Visit: Payer: Self-pay

## 2018-10-25 DIAGNOSIS — C220 Liver cell carcinoma: Secondary | ICD-10-CM

## 2018-10-25 LAB — POCT I-STAT CREATININE: Creatinine, Ser: 1.1 mg/dL (ref 0.61–1.24)

## 2018-10-25 MED ORDER — IOHEXOL 300 MG/ML  SOLN
100.0000 mL | Freq: Once | INTRAMUSCULAR | Status: AC | PRN
  Administered 2018-10-25: 15:00:00 100 mL via INTRAVENOUS

## 2018-10-25 MED ORDER — SODIUM CHLORIDE (PF) 0.9 % IJ SOLN
INTRAMUSCULAR | Status: AC
  Filled 2018-10-25: qty 50

## 2018-10-26 ENCOUNTER — Other Ambulatory Visit (HOSPITAL_COMMUNITY): Payer: Self-pay | Admitting: Interventional Radiology

## 2018-10-26 DIAGNOSIS — C220 Liver cell carcinoma: Secondary | ICD-10-CM

## 2018-10-28 ENCOUNTER — Encounter: Payer: Self-pay | Admitting: *Deleted

## 2018-10-28 ENCOUNTER — Other Ambulatory Visit: Payer: Self-pay

## 2018-10-28 ENCOUNTER — Ambulatory Visit
Admission: RE | Admit: 2018-10-28 | Discharge: 2018-10-28 | Disposition: A | Discharge status: Home / Self Care | Payer: Medicare Other | Source: Ambulatory Visit | Attending: Interventional Radiology | Admitting: Interventional Radiology

## 2018-10-28 ENCOUNTER — Telehealth: Payer: Self-pay | Admitting: Interventional Cardiology

## 2018-10-28 DIAGNOSIS — C220 Liver cell carcinoma: Secondary | ICD-10-CM

## 2018-10-28 HISTORY — PX: IR RADIOLOGIST EVAL & MGMT: IMG5224

## 2018-10-28 NOTE — Progress Notes (Addendum)
Patient ID: Steve Jensen, male   DOB: Mar 30, 1941, 78 y.o.   MRN: [id number]         Chief Complaint: Patient was consulted remotely today (TeleHealth) for biopsy-proven hepatocellular carcinoma  Referring Physician(s): Burr Medico (Oncology)  History of Present Illness:  Steve Jensen is a 78 y.o. male with past medical history significant for type 2 diabetes, hypertension, hyperlipidemia, morbid obesity with obstructive sleep apnea and prostate cancer who returns today to the interventional radiology clinic following technically successful bland embolization of biopsy-proven hepatocellular carcinoma within the subcapsular aspect of the right lobe of the liver performed on 02/15/2018.   Patient was initially to undergo microwave ablation of the embolized lesion earlier this year however the decision was made to postpone this procedure giving imaging response and uncertainty surrounding the COVID crisis.  Patient has since undergone a surveillance cirrhotic protocol CT scan on 10/25/2018 and I am calling the patient to discuss the results as well as to formulate a definitive plan of care.  Patient is currently without complaint.  He has been maintaining social distancing and is without infectious symptoms.  No unintentional weight loss.  No yellowing of the skin or eyes.  No change in mental status.  No bloody or melanotic stools.  No chest pain or shortness of breath.  The patient is again without complaint and is interested in pursuing all available treatment options.    Past Medical History:  Diagnosis Date  . Anemia   . Diabetes mellitus type II, controlled ([place])    with neuropathy  . Dysrhythmia   . GERD (gastroesophageal reflux disease)   . Hyperlipidemia   . Hypertension   . Morbid obesity ([place])   . OSA (obstructive sleep apnea)    On BiPAP at 15/11cm H2O  . Prostate cancer ([place])   . Shingles    on face Nov 2010  . Urinary incontinence     Past Surgical History:  Procedure  Laterality Date  . CARDIOVERSION N/A 09/06/2013   Procedure: CARDIOVERSION;  Surgeon: [name], MD;  Location: [facility] ENDOSCOPY;  Service: Cardiovascular;  Laterality: N/A;  . EYE SURGERY     cataract surgery bilat; surgery to correct droopy eyelids   . IR ANGIOGRAM SELECTIVE EACH ADDITIONAL VESSEL  02/15/2018  . IR ANGIOGRAM SELECTIVE EACH ADDITIONAL VESSEL  02/15/2018  . IR ANGIOGRAM SELECTIVE EACH ADDITIONAL VESSEL  02/15/2018  . IR ANGIOGRAM SELECTIVE EACH ADDITIONAL VESSEL  02/15/2018  . IR ANGIOGRAM SELECTIVE EACH ADDITIONAL VESSEL  02/15/2018  . IR ANGIOGRAM VISCERAL SELECTIVE  02/15/2018  . IR ANGIOGRAM VISCERAL SELECTIVE  02/15/2018  . IR EMBO TUMOR ORGAN ISCHEMIA INFARCT INC GUIDE ROADMAPPING  02/15/2018  . IR RADIOLOGIST EVAL & MGMT  01/26/2018  . IR RADIOLOGIST EVAL & MGMT  03/17/2018  . IR RADIOLOGIST EVAL & MGMT  06/29/2018  . IR RADIOLOGIST EVAL & MGMT  07/27/2018  . IR US GUIDE VASC ACCESS RIGHT  02/15/2018  . knee surgery bilat     . PROSTATE SURGERY    . right shoulder rotator cuff surgery    . TONSILLECTOMY      Allergies: Lipitor [atorvastatin]; Simvastatin; and Zithromax [azithromycin]  Medications: Prior to Admission medications   Medication Sig Start Date End Date Taking? Authorizing Provider  diazepam (VALIUM) 5 MG tablet Take 2 tablets (10 mg total) by mouth as directed. Prior to MR.  May take additional 1 tablet (5 mg) by mouth if needed. 06/29/18   Sandi Mariscal, MD  diltiazem [facility] CD)  240 MG 24 hr capsule Take 1 capsule (240 mg total) by mouth daily. Please keep upcoming appt in January with Dr. Irish Lack for future refills. Thank you 08/02/18   [name], MD  ELIQUIS 5 MG TABS tablet TAKE 1 TABLET(5 MG) BY MOUTH TWICE DAILY 05/20/18   [name], MD  fish oil-omega-3 fatty acids 1000 MG capsule Take 2,400 mg by mouth 2 (two) times daily.    [provider]  furosemide (LASIX) 40 MG tablet TAKE 1 TABLET BY MOUTH TWICE DAILY AS NEEDED  06/18/18   [name], MD  ibuprofen (ADVIL,MOTRIN) 200 MG tablet Take 800 mg by mouth every 6 (six) hours as needed (PAIN). prn 06/30/13   [provider]  loratadine (CLARITIN) 10 MG tablet Take 10 mg by mouth daily.    [provider]  metFORMIN (GLUCOPHAGE) 1000 MG tablet Take 1,000 mg by mouth 2 (two) times daily. 06/27/14   [provider]  metoprolol succinate (TOPROL-XL) 50 MG 24 hr tablet Take 50 mg by mouth daily.  08/10/13   [provider]  Multiple Vitamins-Minerals (MULTIVITAMIN WITH MINERALS) tablet Take 1 tablet by mouth daily.    [provider]  omeprazole (PRILOSEC) 20 MG capsule Take 20 mg by mouth daily.  11/29/12   [provider]  potassium chloride SA (K-DUR,KLOR-CON) 20 MEQ tablet TAKE 1 TABLET BY MOUTH EVERY DAY AS NEEDED WHILE TAKING FUROSEMIDE 06/18/18   [name], MD  pregabalin (LYRICA) 50 MG capsule Take 2 capsules by mouth 2 (two) times a day. 09/14/18   [provider]  ramipril (ALTACE) 10 MG capsule Take 10 mg by mouth 2 (two) times daily. 01/11/13   [provider]  Semaglutide (OZEMPIC) 1 MG/DOSE SOPN Inject 1 mg into the skin once a week. Monday     [provider]  ZETIA 10 MG tablet Take 10 mg by mouth daily.  01/17/13   [provider]     Family History  Problem Relation Age of Onset  . Hypertension Brother   . Diabetes Brother   . Cancer Brother        bladder cancer  . Diabetes Brother   . CVA Brother   . Hypertension Brother   . Prostate cancer Brother   . Diabetes Brother   . Hypertension Brother   . Heart disease Brother        CABG  . Heart attack Brother   . Stroke Brother     Social History   Socioeconomic History  . Marital status: Married    Spouse name: Not on file  . Number of children: Not on file  . Years of education: Not on file  . Highest education level: Not on file  Occupational History  . Not on file  Social Needs   . Financial resource strain: Not on file  . Food insecurity:    Worry: Not on file    Inability: Not on file  . Transportation needs:    Medical: Not on file    Non-medical: Not on file  Tobacco Use  . Smoking status: Former Smoker    Packs/day: 1.00    Years: 15.00    Pack years: 15.00    Last attempt to quit: 05/27/1975    Years since quitting: 43.4  . Smokeless tobacco: Never Used  Substance and Sexual Activity  . Alcohol use: No  . Drug use: No  . Sexual activity: Not on file  Lifestyle  .  Physical activity:    Days per week: Not on file    Minutes per session: Not on file  . Stress: Not on file  Relationships  . Social connections:    Talks on phone: Not on file    Gets together: Not on file    Attends religious service: Not on file    Active member of club or organization: Not on file    Attends meetings of clubs or organizations: Not on file    Relationship status: Not on file  Other Topics Concern  . Not on file  Social History Narrative  . Not on file    ECOG Status: 0 - Asymptomatic  Review of Systems  Constitutional: Negative for activity change, appetite change, fever and unexpected weight change.  Respiratory: Negative for cough and shortness of breath.   Cardiovascular: Negative for chest pain.  Gastrointestinal: Negative for abdominal pain.  Psychiatric/Behavioral: Negative for confusion.    Review of Systems: A 12 point ROS discussed and pertinent positives are indicated in the HPI above.  All other systems are negative.  Physical Exam No direct physical exam was performed (except for noted visual exam findings with Video Visits).    Vital Signs: There were no vitals taken for this visit.  Imaging: Ct Abdomen W Wo Contrast  Result Date: 10/25/2018 CLINICAL DATA:  Hepatocellular carcinoma.  Pre ablation. EXAM: CT ABDOMEN WITHOUT AND WITH CONTRAST TECHNIQUE: Multidetector CT imaging of the abdomen was performed following the standard protocol  before and following the bolus administration of intravenous contrast. CONTRAST:  174mL OMNIPAQUE IOHEXOL 300 MG/ML  SOLN COMPARISON:  08/23/2018 FINDINGS: Lower chest: 4 mm subpleural left lower lobe pulmonary nodule is similar. Normal heart size without pericardial or pleural effusion. Tiny hiatal hernia. Hepatobiliary: Mild to moderate hepatic steatosis. Hepatomegaly at 19.9 cm. Mild caudate lobe enlargement without overt cirrhosis. Arterially enhancing lesion within segment 6 of the liver measures 2.6 x 2.3 cm on image 44/4. 2.4 x 2.4 cm on the prior. 2.0 cm craniocaudal today versus 1.9 cm on the prior. Persistent delayed and portal venous phase hypoenhancement, including on image 49/9. No new liver lesion. There are tiny foci of arterial hyperenhancement including within the high left hepatic lobe on image 18/4 and in segment 4A on image 25/4. These were likely present previously. Probable small gallstone without acute cholecystitis or biliary duct dilatation. Pancreas: Normal, without mass or ductal dilatation. Spleen: Normal in size, without focal abnormality. Adrenals/Urinary Tract: Right adrenal nodule of 2.9 cm and low-density, consistent with an adenoma. Normal left adrenal gland. Mild renal cortical thinning bilaterally. No renal mass on post-contrast images. Stomach/Bowel: Normal remainder of the stomach. Normal abdominal large and small bowel loops. Vascular/Lymphatic: Aortic and branch vessel atherosclerosis. Focal nonaneurysmal dilatation of the infrarenal aorta at 2.7 cm, similar. Patent portal and hepatic veins. No specific evidence of portal venous hypertension. No retroperitoneal or retrocrural adenopathy. Other: No ascites. Musculoskeletal: Lower thoracic spondylosis. IMPRESSION: 1. Similar size of a segment 6 hepatocellular carcinoma. 2. No new liver lesion or evidence of metastatic disease. 3. Hepatic steatosis and hepatomegaly. 4. Right adrenal adenoma. 5. Similar 4 mm left lower lobe  pulmonary nodule. Fleischner criteria do not apply in the setting of presumed primary malignancy. Consider CT follow-up at 6 months or attention on follow-up abdominal CT. 6.  Tiny hiatal hernia. Electronically Signed   By: Abigail Miyamoto M.D.   On: 10/25/2018 16:25    Labs:  CBC: Recent Labs    12/22/17 0740 12/30/17  1606 02/15/18 1250 07/02/18 1448  WBC 10.3 10.9* 10.3 12.3*  HGB 15.1 15.2 15.0 14.6  HCT 44.0 44.7 43.2 42.5  PLT 274 343 318 377    COAGS: Recent Labs    12/22/17 0615 12/30/17 1606 02/15/18 1250  INR 0.99 1.08 0.95    BMP: Recent Labs    12/30/17 1606 02/15/18 1250 03/11/18 1345 07/02/18 1448 10/25/18 1502  NA 140 144 140 142  --   K 3.8 3.6 4.1 3.8  --   CL 100 102 101 103  --   CO2 27 29 25 27   --   GLUCOSE 126* 132* 174* 122*  --   BUN 11 13 12 13   --   CALCIUM 9.8 9.5 10.0 9.5  --   CREATININE 1.08 1.01 0.95 1.08 1.10  GFRNONAA >60 >60 77 >60  --   GFRAA >60 >60 90 >60  --     LIVER FUNCTION TESTS: Recent Labs    12/30/17 1606 02/15/18 1250 03/11/18 1345 07/02/18 1448  BILITOT 0.5 0.7 0.3 0.7  AST 44* 37 44* 41  ALT 59* 42 42 41  ALKPHOS 105 91  --  109  PROT 7.3 7.2 6.4 6.9  ALBUMIN 3.9 4.0  --  3.7    TUMOR MARKERS:  Note, patient's AFP has never been elevated measuring 2.2 on 07/02/2018 and 1.2 on 12/31/2017  Assessment and Plan:  Steve Jensen is a 78 y.o. male with past medical history significant for type 2 diabetes, hypertension, hyperlipidemia, morbid obesity with obstructive sleep apnea and prostate cancer who returns today to the interventional radiology clinic following technically successful bland embolization of biopsy-proven hepatocellular carcinoma within the subcapsular aspect of the right lobe of the liver performed on 02/15/2018.   Patient was initially to undergo microwave ablation of the embolized lesion earlier this year however the decision was made to postpone this procedure giving imaging response and  uncertainty surrounding the COVID crisis.  Patient has since undergone a surveillance cirrhotic protocol CT scan on 10/25/2018 and I am calling the patient to discuss the results as well as to formulate a definitive plan of care.  Patient is currently without complaint.   Fortunately, surveillance CT scan performed 10/25/2018 demonstrates no change to continued slight decrease in size of infiltrative lesion within the subcapsular aspect of the right lobe of the liver currently measuring approximately 2.8 x 2.6 x 2.3 cm (of note, measurements are difficult secondary to the ill-defined borders of the lesion as well as Pap to moderate artifact due to patient body habitus), previously, 3.0 x 2.9 x 2.3 cm (when compared to the 07/27/2018 examination), initially 4.0 x 3.8 x 3.4 cm on pre-embolization CT scan performed 12/10/2017. Importantly, there are no additional definitive evidence of abnormal hyperintense enhancement to suggest additional foci of hepatocellular carcinoma.  Potential treatment options were discussed at length with the patient including the following:  1.  Continued conservative management with surveillance CT scans every 3 months - The patient is interested in aggressively pursuing all available treatment options. 2.  Repeat bland hepatic embolization - patient did receive a technically good result following the bland embolization, though transcatheter treatment options are not curative and patient wishes to be as aggressive as possible 4.  Microwave ablation with hope of curative intent - from my initial consultation with the patient, he was interested in pursuing microwave ablation, however initially the lesion was greater than 3 cm and as such we proceeded bland embolization in hopes of reducing  the size of the lesion to less than 3 cm to decrease the chance of incomplete ablation.  Fortunately, the patient has had a successful response to bland embolization and additionally no new lesions  have appeared in the interval.  As such I feel the patient is a good candidate for microwave ablation,  though I am worried that given the ill-defined borders of the nodule, it may actually be larger than measurements on most recent CT scan.  Benefits and risks (including but not limited to bleeding infection injury to adjacent organs, postprocedural pain/discomfort and incomplete ablation) of microwave ablation were discussed at great length with the patient.  Following this prolonged and detailed conversation, the patient wishes to pursue microwave ablation.  As such, the procedure will be scheduled with general anesthesia at [facility] at the next available date.  The procedure will entail an overnight admission for PCA usage and continued observation.  While the patient's AFP has never been elevated (2.2 on 07/02/2018 and 1.2 on 12/31/2017), we will obtain a repeat AFP level as part of his work-up for the ablation.  The patient knows to call the interventional radiology clinic with any interval questions or concerns.  Thank you for this interesting consult.  I greatly enjoyed meeting Steve Jensen and look forward to participating in their care.  A copy of this report was sent to the requesting provider on this date.  Electronically Signed: Sandi Mariscal 10/28/2018, 10:10 AM   I spent a total of 15 Minutes in remote  clinical consultation, greater than 50% of which was counseling/coordinating care for biopsy-proven hepatocellular carcinoma.    Visit type: Audio only (telephone). Audio (no video) only due to patient's lack of internet/smartphone capability. Alternative for in-person consultation at [facility], [address] [place], [place], [place]. This visit type was conducted due to national recommendations for restrictions regarding the COVID-19 Pandemic (e.g. social distancing).  This format is felt to be most appropriate for this patient at this time.  All issues noted in this  document were discussed and addressed.

## 2018-10-28 NOTE — Telephone Encounter (Signed)
      [facility] Pre-operative Risk Assessment    Request for surgical clearance:  1. What type of surgery is being performed? Microwave ablation  2. When is this surgery scheduled? TBD  3. What type of clearance is required (medical clearance vs. Pharmacy clearance to hold med vs. Both)? Pharmacy clearance  4. Are there any medications that need to be held prior to surgery and how long? Hold Eliquis 48 hours prior  5. Practice name and name of physician performing surgery? Dr Pascal Lux, [place]  6. What is your office phone number [phone]   7.   What is your office fax number [phone]  8.   Anesthesia type (None, local, MAC, general) ? general   Steve Jensen 10/28/2018, 10:53 AM  _________________________________________________________________   (provider comments below)

## 2018-10-29 NOTE — Telephone Encounter (Signed)
Pt takes Eliquis for afib with CHADS2VASc score of 5 (age x2, CHF, HTN, DM). Renal function is normal. Ok to hold Eliquis for 2 days prior to procedure. If ablation is close to the spine or involves the spine, ok to hold Eliquis for 3 days prior.

## 2018-10-29 NOTE — Telephone Encounter (Signed)
   Primary Cardiologist: Dr Radford Pax  Chart reviewed as part of pre-operative protocol coverage. Given past medical history and time since last visit, based on ACC/AHA guidelines, Steve Jensen would be at acceptable risk for the planned procedure without further cardiovascular testing.   OK to hold Eliquis 48 hours pre op- if procedure is close to the spine or involves the spine OK to hold 3 days pre op.   I will route this recommendation to the requesting party via Epic fax function and remove from pre-op pool.  Please call with questions.  Kerin Ransom, PA-C 10/29/2018, 11:19 AM

## 2018-11-09 NOTE — Progress Notes (Signed)
Sent in basket message via epic to Dr. Pascal Lux requesting orders.

## 2018-11-15 ENCOUNTER — Other Ambulatory Visit: Payer: Self-pay | Admitting: *Deleted

## 2018-11-15 MED ORDER — APIXABAN 5 MG PO TABS: ORAL_TABLET | ORAL | 1 refills | Status: DC

## 2018-11-15 NOTE — Telephone Encounter (Signed)
Pt last saw Dr Radford Pax 10/06/18 telemedicine Covid-19, last labs 07/02/18 Creat 1.08, age 78, weight 137kg, based on specified criteria pt is on appropriate dosage of Eliquis 5mg  BID.  Will refill rx.

## 2018-11-18 ENCOUNTER — Other Ambulatory Visit: Payer: Self-pay | Admitting: Student

## 2018-11-18 NOTE — Progress Notes (Signed)
11/09/2018- noted in Epic-Cardiac Clearance and instructions about Eliquis  10/25/2018- noted in [address]- CT abd w/wo contrast  10/06/2018- noted in [place]- Office note  tele-visit from Dr. Fransico Him  ([facility])  05/27/2018- noted in Epic-Office note with Dr. Irish Lack and EKG

## 2018-11-18 NOTE — Patient Instructions (Addendum)
Steve Jensen     Your procedure is scheduled on: Wednesday 11/24/2018    Report to [facility] Main  Entrance              Report to Radiology  At 07:00  AM                 YOU NEED TO HAVE A COVID 19 TEST ON__6/27_____ @_12 :00 pm______, THIS TEST MUST BE DONE BEFORE SURGERY, COME TO [place].               ONCE  YOUR COVID TEST IS COMPLETED, PLEASE BEGIN THE QUARANTINE INSTRUCTIONS AS OUTLINED IN YOUR HANDOUT.    Call this number if you have problems the morning of surgery [phone]                Please bring BIPAP mask and tubing with you to the hospital!   How to Manage Your Diabetes    Before and After Surgery  Why is it important to control my blood sugar before and after surgery? . Improving blood sugar levels before and after surgery helps healing and can limit problems. . A way of improving blood sugar control is eating a healthy diet by: o  Eating less sugar and carbohydrates o  Increasing activity/exercise o  Talking with your doctor about reaching your blood sugar goals . High blood sugars (greater than 180 mg/dL) can raise your risk of infections and slow your recovery, so you will need to focus on controlling your diabetes during the weeks before surgery. . Make sure that the doctor who takes care of your diabetes knows about your planned surgery including the date and location.  How do I manage my blood sugar before surgery? . Check your blood sugar at least 4 times a day, starting 2 days before surgery, to make sure that the level is not too high or low. o Check your blood sugar the morning of your surgery when you wake up and every 2 hours until you get to the Short Stay unit. . If your blood sugar is less than 70 mg/dL, you will need to treat for low blood sugar: o Do not take insulin. o Treat a low blood sugar (less than 70 mg/dL) with  cup of clear juice (cranberry or apple), 4 glucose  tablets, OR glucose gel. o Recheck blood sugar in 15 minutes after treatment (to make sure it is greater than 70 mg/dL). If your blood sugar is not greater than 70 mg/dL on recheck, call [phone] for further instructions. . Report your blood sugar to the short stay nurse when you get to Short Stay.  . If you are admitted to the hospital after surgery: o Your blood sugar will be checked by the staff and you will probably be given insulin after surgery (instead of oral diabetes medicines) to make sure you have good blood sugar levels. o The goal for blood sugar control after surgery is 80-180 mg/dL.   WHAT DO I DO ABOUT MY DIABETES MEDICATION?        The day before your surgery , Take Metformin as usual.   . Do not take oral diabetes medicines (pills) the morning of surgery.   . The day of surgery, do not take other diabetes injectables, including Byetta (exenatide), Bydureon (exenatide ER), Victoza (liraglutide), or Trulicity (dulaglutide).  Remember: Do not eat food or drink liquids :After Midnight.               BRUSH YOUR TEETH MORNING OF SURGERY AND RINSE YOUR MOUTH OUT, NO CHEWING GUM CANDY OR MINTS.     Take these medicines the morning of surgery with A SIP OF WATER             : Diltiazem (Cardiazem CD), Metoprolol succinate (Toprol-XL), Omeprazole (Prilosec), Pregabalin (Lyrica)               DO NOT TAKE ANY DIABETIC MEDICATIONS DAY OF YOUR SURGERY!                               You may not have any metal on your body including piercings             Do not wear jewelry, lotions, powders or  deodorant             Men may shave face and neck.   Do not bring valuables to the hospital. [place].  Contacts, dentures or bridgework may not be worn into surgery.                 Please read over the following fact sheets you were given: _____________________________________________________________________              [facility] - Preparing for Surgery  Before surgery, you can play an important role.   Because skin is not sterile, your skin needs to be as free of germs as possible.   You can reduce the number of germs on your skin by washing with CHG (chlorahexidine gluconate) soap before surgery.   CHG is an antiseptic cleaner which kills germs and bonds with the skin to continue killing germs even after washing. Please DO NOT use if you have an allergy to CHG or antibacterial soaps.   If your skin becomes reddened/irritated stop using the CHG and inform your nurse when you arrive at Short Stay. Do not shave (including legs and underarms) for at least 48 hours prior to the first CHG shower.   You may shave your face/neck. Please follow these instructions carefully:  1.  Shower with CHG Soap the night before surgery and the  morning of Surgery.  2.  If you choose to wash your hair, wash your hair first as usual with your  normal  shampoo.  3.  After you shampoo, rinse your hair and body thoroughly to remove the  shampoo.                                        4.  Use CHG as you would any other liquid soap.  You can apply chg directly  to the skin and wash                       Gently with a scrungie or clean washcloth.  5.  Apply the CHG Soap to your body ONLY FROM THE NECK DOWN.   Do not use on face/ open  Wound or open sores. Avoid contact with eyes, ears mouth and genitals (private parts).                       Wash face,  Genitals (private parts) with your normal soap.             6.  Wash thoroughly, paying special attention to the area where your surgery  will be performed.  7.  Thoroughly rinse your body with warm water from the neck down.  8.  DO NOT shower/wash with your normal soap after using and rinsing off  the CHG Soap.             9.  Pat yourself dry with a clean towel.            10.  Wear clean pajamas.            11.  Place clean sheets on your bed the  night of your first shower and do not  sleep with pets.  Day of Surgery : Do not apply any lotions/deodorants the morning of surgery.  Please wear clean clothes to the hospital/surgery center.   FAILURE TO FOLLOW THESE INSTRUCTIONS MAY RESULT IN THE CANCELLATION OF YOUR SURGERY PATIENT SIGNATURE_________________________________  NURSE SIGNATURE__________________________________  ________________________________________________________________________

## 2018-11-19 ENCOUNTER — Encounter (HOSPITAL_COMMUNITY): Payer: Self-pay

## 2018-11-19 ENCOUNTER — Other Ambulatory Visit: Payer: Self-pay

## 2018-11-19 ENCOUNTER — Encounter (HOSPITAL_COMMUNITY)
Admission: RE | Admit: 2018-11-19 | Discharge: 2018-11-19 | Disposition: A | Discharge status: Home / Self Care | Payer: Medicare Other | Source: Ambulatory Visit | Attending: Interventional Radiology | Admitting: Interventional Radiology

## 2018-11-19 DIAGNOSIS — G4733 Obstructive sleep apnea (adult) (pediatric): Secondary | ICD-10-CM | POA: Insufficient documentation

## 2018-11-19 DIAGNOSIS — Z8546 Personal history of malignant neoplasm of prostate: Secondary | ICD-10-CM | POA: Diagnosis not present

## 2018-11-19 DIAGNOSIS — I10 Essential (primary) hypertension: Secondary | ICD-10-CM | POA: Insufficient documentation

## 2018-11-19 DIAGNOSIS — C220 Liver cell carcinoma: Secondary | ICD-10-CM | POA: Insufficient documentation

## 2018-11-19 DIAGNOSIS — K219 Gastro-esophageal reflux disease without esophagitis: Secondary | ICD-10-CM | POA: Insufficient documentation

## 2018-11-19 DIAGNOSIS — D3501 Benign neoplasm of right adrenal gland: Secondary | ICD-10-CM | POA: Insufficient documentation

## 2018-11-19 DIAGNOSIS — E785 Hyperlipidemia, unspecified: Secondary | ICD-10-CM | POA: Insufficient documentation

## 2018-11-19 DIAGNOSIS — R911 Solitary pulmonary nodule: Secondary | ICD-10-CM | POA: Insufficient documentation

## 2018-11-19 DIAGNOSIS — Z1159 Encounter for screening for other viral diseases: Secondary | ICD-10-CM | POA: Insufficient documentation

## 2018-11-19 DIAGNOSIS — Z7901 Long term (current) use of anticoagulants: Secondary | ICD-10-CM | POA: Insufficient documentation

## 2018-11-19 DIAGNOSIS — Z87891 Personal history of nicotine dependence: Secondary | ICD-10-CM | POA: Diagnosis not present

## 2018-11-19 DIAGNOSIS — I4891 Unspecified atrial fibrillation: Secondary | ICD-10-CM | POA: Diagnosis not present

## 2018-11-19 DIAGNOSIS — Z01812 Encounter for preprocedural laboratory examination: Secondary | ICD-10-CM | POA: Insufficient documentation

## 2018-11-19 DIAGNOSIS — E119 Type 2 diabetes mellitus without complications: Secondary | ICD-10-CM | POA: Diagnosis not present

## 2018-11-19 DIAGNOSIS — K760 Fatty (change of) liver, not elsewhere classified: Secondary | ICD-10-CM | POA: Diagnosis not present

## 2018-11-19 DIAGNOSIS — Z791 Long term (current) use of non-steroidal anti-inflammatories (NSAID): Secondary | ICD-10-CM | POA: Insufficient documentation

## 2018-11-19 DIAGNOSIS — K449 Diaphragmatic hernia without obstruction or gangrene: Secondary | ICD-10-CM | POA: Diagnosis not present

## 2018-11-19 DIAGNOSIS — Z6841 Body Mass Index (BMI) 40.0 and over, adult: Secondary | ICD-10-CM | POA: Insufficient documentation

## 2018-11-19 DIAGNOSIS — Z7984 Long term (current) use of oral hypoglycemic drugs: Secondary | ICD-10-CM | POA: Diagnosis not present

## 2018-11-19 DIAGNOSIS — Z79899 Other long term (current) drug therapy: Secondary | ICD-10-CM | POA: Diagnosis not present

## 2018-11-19 HISTORY — DX: Myoneural disorder, unspecified: G70.9

## 2018-11-19 LAB — COMPREHENSIVE METABOLIC PANEL
ALT: 39 U/L (ref 0–44)
AST: 35 U/L (ref 15–41)
Albumin: 4.1 g/dL (ref 3.5–5.0)
Alkaline Phosphatase: 97 U/L (ref 38–126)
Anion gap: 14 (ref 5–15)
BUN: 17 mg/dL (ref 8–23)
CO2: 27 mmol/L (ref 22–32)
Calcium: 9.7 mg/dL (ref 8.9–10.3)
Chloride: 102 mmol/L (ref 98–111)
Creatinine, Ser: 1.13 mg/dL (ref 0.61–1.24)
GFR calc Af Amer: >60 mL/min (ref >60)
GFR calc non Af Amer: >60 mL/min (ref >60)
Glucose, Bld: 206 mg/dL — ABNORMAL HIGH (ref 70–99)
Potassium: 3.6 mmol/L (ref 3.5–5.1)
Sodium: 143 mmol/L (ref 135–145)
Total Bilirubin: 0.8 mg/dL (ref 0.3–1.2)
Total Protein: 7.4 g/dL (ref 6.5–8.1)

## 2018-11-19 LAB — CBC WITH DIFFERENTIAL/PLATELET
Abs Immature Granulocytes: 0.05 10*3/uL (ref 0.00–0.07)
Basophils Absolute: 0 10*3/uL (ref 0.0–0.1)
Basophils Relative: 0 %
Eosinophils Absolute: 0.1 10*3/uL (ref 0.0–0.5)
Eosinophils Relative: 1 %
HCT: 44.1 % (ref 39.0–52.0)
Hemoglobin: 14.6 g/dL (ref 13.0–17.0)
Immature Granulocytes: 1 %
Lymphocytes Relative: 24 %
Lymphs Abs: 2.2 10*3/uL (ref 0.7–4.0)
MCH: 29.3 pg (ref 26.0–34.0)
MCHC: 33.1 g/dL (ref 30.0–36.0)
MCV: 88.6 fL (ref 80.0–100.0)
Monocytes Absolute: 0.9 10*3/uL (ref 0.1–1.0)
Monocytes Relative: 10 %
Neutro Abs: 5.9 10*3/uL (ref 1.7–7.7)
Neutrophils Relative %: 64 %
Platelets: 280 10*3/uL (ref 150–400)
RBC: 4.98 MIL/uL (ref 4.22–5.81)
RDW: 13.7 % (ref 11.5–15.5)
WBC: 9.2 10*3/uL (ref 4.0–10.5)
nRBC: 0 % (ref 0.0–0.2)

## 2018-11-19 LAB — PROTIME-INR
INR: 1.1 (ref 0.8–1.2)
Prothrombin Time: 13.8 seconds (ref 11.4–15.2)

## 2018-11-19 LAB — APTT: aPTT: 40 seconds — ABNORMAL HIGH (ref 24–36)

## 2018-11-19 LAB — GLUCOSE, CAPILLARY: Glucose-Capillary: 193 mg/dL — ABNORMAL HIGH (ref 70–99)

## 2018-11-19 NOTE — Progress Notes (Signed)
Patient reports that he was difficult to catheterize even with a coude. He requests that he not be catheterized for procedure.

## 2018-11-20 ENCOUNTER — Other Ambulatory Visit (HOSPITAL_COMMUNITY)
Admission: RE | Admit: 2018-11-20 | Discharge: 2018-11-20 | Disposition: A | Discharge status: Home / Self Care | Payer: Medicare Other | Source: Ambulatory Visit | Attending: Interventional Radiology | Admitting: Interventional Radiology

## 2018-11-20 DIAGNOSIS — Z01812 Encounter for preprocedural laboratory examination: Secondary | ICD-10-CM | POA: Diagnosis not present

## 2018-11-20 LAB — HEMOGLOBIN A1C
Hgb A1c MFr Bld: 7.3 % — ABNORMAL HIGH (ref 4.8–5.6)
Mean Plasma Glucose: 162.81 mg/dL

## 2018-11-20 LAB — ABO/RH: ABO/RH(D): O POS

## 2018-11-20 LAB — SARS CORONAVIRUS 2 (TAT 6-24 HRS): SARS Coronavirus 2: NEGATIVE

## 2018-11-22 NOTE — Anesthesia Preprocedure Evaluation (Addendum)
Anesthesia Evaluation  Patient identified by MRN, date of birth, ID band Patient awake    Reviewed: Allergy & Precautions, NPO status , Patient's Chart, lab work & pertinent test results  Airway Mallampati: II  TM Distance: >3 FB Neck ROM: Full    Dental no notable dental hx. (+) Upper Dentures, Lower Dentures   Pulmonary sleep apnea and Continuous Positive Airway Pressure Ventilation , former smoker,    Pulmonary exam normal breath sounds clear to auscultation       Cardiovascular hypertension, Pt. on medications Normal cardiovascular exam+ dysrhythmias Atrial Fibrillation  Rhythm:Regular Rate:Normal     Neuro/Psych negative neurological ROS  negative psych ROS   GI/Hepatic negative GI ROS, Neg liver ROS,   Endo/Other  diabetes, Type 2Morbid obesity  Renal/GU negative Renal ROS  negative genitourinary   Musculoskeletal negative musculoskeletal ROS (+)   Abdominal   Peds negative pediatric ROS (+)  Hematology negative hematology ROS (+)   Anesthesia Other Findings   Reproductive/Obstetrics negative OB ROS                            Anesthesia Physical Anesthesia Plan  ASA: III  Anesthesia Plan: General   Post-op Pain Management:    Induction: Intravenous  PONV Risk Score and Plan: 2 and Ondansetron and Treatment may vary due to age or medical condition  Airway Management Planned: Oral ETT  Additional Equipment:   Intra-op Plan:   Post-operative Plan: Extubation in OR  Informed Consent: I have reviewed the patients History and Physical, chart, labs and discussed the procedure including the risks, benefits and alternatives for the proposed anesthesia with the patient or authorized representative who has indicated his/her understanding and acceptance.     Dental advisory given  Plan Discussed with: CRNA  Anesthesia Plan Comments: (See PAT note 10/19/2018, Konrad Felix,  PA-C)       Anesthesia Quick Evaluation

## 2018-11-22 NOTE — Progress Notes (Signed)
Anesthesia Chart Review   Case: [id number] Date/Time: 11/24/18 0815   Procedure: MICROWAVE THERMAL ABLATION LIVER (N/A )   Anesthesia type: General   Pre-op diagnosis: HEPATOCELLULAR CARCINAMA   [facility] / [facility]   Surgeon: Sandi Mariscal, MD      DISCUSSION:78 y.o. former smoker (15 pack years, quit 05/27/75) with h/o HTN, DM II, GERD, OSA on Bipap, A-fib s/p cardioversion on Eliquis, HLD, prostate cancer, hepatocellular carcinoma scheduled for above procedure 11/24/2018 with Dr. Sandi Mariscal.   Pt cleared by cardiology.  Per Kerin Ransom, PA-C, "Given past medical history and time since last visit, based on ACC/AHA guidelines, Steve Jensen would be at acceptable risk for the planned procedure without further cardiovascular testing. OK to hold Eliquis 48 hours pre op-if procedure is close to the spine or involves the spine OK to hold 3 days pre op."  Anticipate pt can proceed with planned procedure barring acute status change.   VS: BP (!) 152/74 (BP Location: Right Arm)   Pulse 90   Temp 36.9 C (Oral)   Ht 5\' 11"  (1.803 m)   Wt (!) 138.3 kg   SpO2 97%   BMI 42.54 kg/m   PROVIDERS: [name], MD is PCP   Fransico Him, MD is Cardiologist  LABS: Labs reviewed: Acceptable for surgery. (all labs ordered are listed, but only abnormal results are displayed)  Labs Reviewed  APTT - Abnormal; Notable for the following components:      Result Value   aPTT 40 (*)    All other components within normal limits  COMPREHENSIVE METABOLIC PANEL - Abnormal; Notable for the following components:   Glucose, Bld 206 (*)    All other components within normal limits  HEMOGLOBIN A1C - Abnormal; Notable for the following components:   Hgb A1c MFr Bld 7.3 (*)    All other components within normal limits  GLUCOSE, CAPILLARY - Abnormal; Notable for the following components:   Glucose-Capillary 193 (*)    All other components within normal limits  PROTIME-INR  CBC WITH DIFFERENTIAL/PLATELET   TYPE AND SCREEN  ABO/RH     IMAGES: CT Abdomen 10/25/2018 IMPRESSION: 1. Similar size of a segment 6 hepatocellular carcinoma. 2. No new liver lesion or evidence of metastatic disease. 3. Hepatic steatosis and hepatomegaly. 4. Right adrenal adenoma. 5. Similar 4 mm left lower lobe pulmonary nodule. Fleischner criteria do not apply in the setting of presumed primary malignancy. Consider CT follow-up at 6 months or attention on follow-up abdominal CT. 6.  Tiny hiatal hernia.  EKG: 05/27/2018 Rate 83 bpm Sinus rhythm with occasional premature ventricular complexes  CV:  Past Medical History:  Diagnosis Date  . Anemia   . Diabetes mellitus type II, controlled ([place])    with neuropathy  . Dysrhythmia    A-Fib. cardioversion done  . GERD (gastroesophageal reflux disease)   . Hyperlipidemia   . Hypertension   . Morbid obesity ([place])   . Neuromuscular disorder ([place])    feet  . OSA (obstructive sleep apnea)    On BiPAP at 15/11cm H2O  . Prostate cancer ([place])   . Shingles    on face Nov 2010  . Urinary incontinence     Past Surgical History:  Procedure Laterality Date  . CARDIOVERSION N/A 09/06/2013   Procedure: CARDIOVERSION;  Surgeon: [name], MD;  Location: [facility] ENDOSCOPY;  Service: Cardiovascular;  Laterality: N/A;  . EYE SURGERY     cataract surgery bilat; surgery to correct droopy eyelids   .  IR ANGIOGRAM SELECTIVE EACH ADDITIONAL VESSEL  02/15/2018  . IR ANGIOGRAM SELECTIVE EACH ADDITIONAL VESSEL  02/15/2018  . IR ANGIOGRAM SELECTIVE EACH ADDITIONAL VESSEL  02/15/2018  . IR ANGIOGRAM SELECTIVE EACH ADDITIONAL VESSEL  02/15/2018  . IR ANGIOGRAM SELECTIVE EACH ADDITIONAL VESSEL  02/15/2018  . IR ANGIOGRAM VISCERAL SELECTIVE  02/15/2018  . IR ANGIOGRAM VISCERAL SELECTIVE  02/15/2018  . IR EMBO TUMOR ORGAN ISCHEMIA INFARCT INC GUIDE ROADMAPPING  02/15/2018  . IR RADIOLOGIST EVAL & MGMT  01/26/2018  . IR RADIOLOGIST EVAL & MGMT  03/17/2018  . IR RADIOLOGIST EVAL &  MGMT  06/29/2018  . IR RADIOLOGIST EVAL & MGMT  07/27/2018  . IR RADIOLOGIST EVAL & MGMT  10/28/2018  . IR US GUIDE VASC ACCESS RIGHT  02/15/2018  . knee surgery bilat     . PROSTATE SURGERY    . right shoulder rotator cuff surgery    . TONSILLECTOMY      MEDICATIONS: . apixaban (ELIQUIS) 5 MG TABS tablet  . CINNAMON PO  . diltiazem (CARDIZEM CD) 240 MG 24 hr capsule  . fish oil-omega-3 fatty acids 1000 MG capsule  . furosemide (LASIX) 40 MG tablet  . ibuprofen (ADVIL,MOTRIN) 200 MG tablet  . loratadine (CLARITIN) 10 MG tablet  . metFORMIN (GLUCOPHAGE) 1000 MG tablet  . metoprolol succinate (TOPROL-XL) 50 MG 24 hr tablet  . Multiple Vitamins-Minerals (MULTIVITAMIN WITH MINERALS) tablet  . omeprazole (PRILOSEC) 20 MG capsule  . oxymetazoline (AFRIN) 0.05 % nasal spray  . potassium chloride SA (K-DUR,KLOR-CON) 20 MEQ tablet  . pregabalin (LYRICA) 100 MG capsule  . ramipril (ALTACE) 10 MG capsule  . Semaglutide (OZEMPIC) 1 MG/DOSE SOPN  . ZETIA 10 MG tablet   No current facility-administered medications for this encounter.     Maia Plan [facility] Pre-Surgical Testing [phone] 11/22/18  2:17 PM

## 2018-11-23 ENCOUNTER — Other Ambulatory Visit: Payer: Self-pay | Admitting: Radiology

## 2018-11-23 ENCOUNTER — Other Ambulatory Visit: Payer: Self-pay | Admitting: Student

## 2018-11-24 ENCOUNTER — Ambulatory Visit (HOSPITAL_COMMUNITY): Payer: Medicare Other

## 2018-11-24 ENCOUNTER — Ambulatory Visit (HOSPITAL_COMMUNITY)
Admission: RE | Admit: 2018-11-24 | Discharge: 2018-11-24 | Disposition: A | Discharge status: Home / Self Care | Payer: Medicare Other | Source: Ambulatory Visit | Attending: Interventional Radiology | Admitting: Interventional Radiology

## 2018-11-24 ENCOUNTER — Other Ambulatory Visit: Payer: Self-pay

## 2018-11-24 ENCOUNTER — Encounter (HOSPITAL_COMMUNITY): Payer: Self-pay

## 2018-11-24 ENCOUNTER — Ambulatory Visit (HOSPITAL_COMMUNITY): Payer: Medicare Other | Admitting: Anesthesiology

## 2018-11-24 ENCOUNTER — Observation Stay (HOSPITAL_COMMUNITY)
Admission: RE | Admit: 2018-11-24 | Discharge: 2018-11-26 | Disposition: A | Discharge status: Home / Self Care | Payer: Medicare Other | Attending: Interventional Radiology | Admitting: Interventional Radiology

## 2018-11-24 ENCOUNTER — Encounter (HOSPITAL_COMMUNITY)
Admission: RE | Disposition: A | Discharge status: Home / Self Care | Payer: Self-pay | Source: Home / Self Care | Attending: Interventional Radiology

## 2018-11-24 ENCOUNTER — Ambulatory Visit (HOSPITAL_COMMUNITY): Payer: Medicare Other | Admitting: Physician Assistant

## 2018-11-24 DIAGNOSIS — G4733 Obstructive sleep apnea (adult) (pediatric): Secondary | ICD-10-CM | POA: Insufficient documentation

## 2018-11-24 DIAGNOSIS — Z7901 Long term (current) use of anticoagulants: Secondary | ICD-10-CM | POA: Diagnosis not present

## 2018-11-24 DIAGNOSIS — I4891 Unspecified atrial fibrillation: Secondary | ICD-10-CM | POA: Insufficient documentation

## 2018-11-24 DIAGNOSIS — Z6841 Body Mass Index (BMI) 40.0 and over, adult: Secondary | ICD-10-CM | POA: Diagnosis not present

## 2018-11-24 DIAGNOSIS — Z79899 Other long term (current) drug therapy: Secondary | ICD-10-CM | POA: Diagnosis not present

## 2018-11-24 DIAGNOSIS — Z7984 Long term (current) use of oral hypoglycemic drugs: Secondary | ICD-10-CM | POA: Insufficient documentation

## 2018-11-24 DIAGNOSIS — E1140 Type 2 diabetes mellitus with diabetic neuropathy, unspecified: Secondary | ICD-10-CM | POA: Insufficient documentation

## 2018-11-24 DIAGNOSIS — E785 Hyperlipidemia, unspecified: Secondary | ICD-10-CM | POA: Insufficient documentation

## 2018-11-24 DIAGNOSIS — Z8546 Personal history of malignant neoplasm of prostate: Secondary | ICD-10-CM | POA: Diagnosis not present

## 2018-11-24 DIAGNOSIS — C220 Liver cell carcinoma: Principal | ICD-10-CM | POA: Diagnosis present

## 2018-11-24 DIAGNOSIS — Z01818 Encounter for other preprocedural examination: Secondary | ICD-10-CM

## 2018-11-24 DIAGNOSIS — Z87891 Personal history of nicotine dependence: Secondary | ICD-10-CM | POA: Insufficient documentation

## 2018-11-24 DIAGNOSIS — K219 Gastro-esophageal reflux disease without esophagitis: Secondary | ICD-10-CM | POA: Diagnosis not present

## 2018-11-24 DIAGNOSIS — I10 Essential (primary) hypertension: Secondary | ICD-10-CM | POA: Diagnosis not present

## 2018-11-24 HISTORY — PX: RADIOLOGY WITH ANESTHESIA: SHX6223

## 2018-11-24 LAB — TYPE AND SCREEN
ABO/RH(D): O POS
Antibody Screen: NEGATIVE

## 2018-11-24 LAB — GLUCOSE, CAPILLARY
Glucose-Capillary: 168 mg/dL — ABNORMAL HIGH (ref 70–99)
Glucose-Capillary: 186 mg/dL — ABNORMAL HIGH (ref 70–99)
Glucose-Capillary: 133 mg/dL — ABNORMAL HIGH (ref 70–99)
Glucose-Capillary: 135 mg/dL — ABNORMAL HIGH (ref 70–99)

## 2018-11-24 SURGERY — CT WITH ANESTHESIA
Anesthesia: General

## 2018-11-24 MED ORDER — OXYMETAZOLINE HCL 0.05 % NA SOLN
1.0000 | Freq: Two times a day (BID) | NASAL | Status: DC | PRN
  Filled 2018-11-24: qty 15

## 2018-11-24 MED ORDER — ALBUMIN HUMAN 5 % IV SOLN
INTRAVENOUS | Status: DC | PRN
  Administered 2018-11-24 (×2): via INTRAVENOUS

## 2018-11-24 MED ORDER — ALBUTEROL SULFATE (2.5 MG/3ML) 0.083% IN NEBU
INHALATION_SOLUTION | RESPIRATORY_TRACT | Status: AC
  Filled 2018-11-24: qty 3

## 2018-11-24 MED ORDER — EZETIMIBE 10 MG PO TABS
10.0000 mg | ORAL_TABLET | Freq: Every day | ORAL | Status: DC
  Administered 2018-11-25 – 2018-11-26 (×2): 10 mg via ORAL
  Filled 2018-11-24 (×2): qty 1

## 2018-11-24 MED ORDER — ALBUMIN HUMAN 5 % IV SOLN
INTRAVENOUS | Status: AC
  Filled 2018-11-24: qty 500

## 2018-11-24 MED ORDER — SODIUM CHLORIDE 0.9 % IV SOLN
INTRAVENOUS | Status: AC
  Filled 2018-11-24: qty 1000

## 2018-11-24 MED ORDER — PREGABALIN 100 MG PO CAPS
100.0000 mg | ORAL_CAPSULE | Freq: Two times a day (BID) | ORAL | Status: DC
  Administered 2018-11-24 – 2018-11-26 (×4): 100 mg via ORAL
  Filled 2018-11-24: qty 2
  Filled 2018-11-24 (×2): qty 1
  Filled 2018-11-24: qty 2

## 2018-11-24 MED ORDER — DILTIAZEM HCL ER COATED BEADS 240 MG PO CP24
240.0000 mg | ORAL_CAPSULE | Freq: Every day | ORAL | Status: DC
  Administered 2018-11-25 – 2018-11-26 (×2): 240 mg via ORAL
  Filled 2018-11-24 (×2): qty 1

## 2018-11-24 MED ORDER — MEPERIDINE HCL 50 MG/ML IJ SOLN: 6.2500 mg | INTRAMUSCULAR | Status: DC | PRN

## 2018-11-24 MED ORDER — LACTATED RINGERS IV SOLN
INTRAVENOUS | Status: DC
  Administered 2018-11-24 (×2): via INTRAVENOUS

## 2018-11-24 MED ORDER — PANTOPRAZOLE SODIUM 40 MG PO TBEC
80.0000 mg | DELAYED_RELEASE_TABLET | Freq: Every day | ORAL | Status: DC
  Administered 2018-11-25 – 2018-11-26 (×2): 80 mg via ORAL
  Filled 2018-11-24 (×2): qty 2

## 2018-11-24 MED ORDER — SODIUM CHLORIDE 0.9 % IV SOLN
INTRAVENOUS | Status: DC | PRN
  Administered 2018-11-24: 09:00:00 50 ug/min via INTRAVENOUS

## 2018-11-24 MED ORDER — ONDANSETRON HCL 4 MG/2ML IJ SOLN: 4.0000 mg | Freq: Four times a day (QID) | INTRAMUSCULAR | Status: DC | PRN

## 2018-11-24 MED ORDER — SODIUM CHLORIDE (PF) 0.9 % IJ SOLN
INTRAMUSCULAR | Status: AC
  Filled 2018-11-24: qty 50

## 2018-11-24 MED ORDER — NALOXONE HCL 0.4 MG/ML IJ SOLN: 0.4000 mg | INTRAMUSCULAR | Status: DC | PRN

## 2018-11-24 MED ORDER — HYDROMORPHONE 1 MG/ML IV SOLN
INTRAVENOUS | Status: AC
  Administered 2018-11-24: 30 mg via INTRAVENOUS
  Administered 2018-11-24 (×2): 0.3 mg via INTRAVENOUS
  Administered 2018-11-24: 1.5 mg via INTRAVENOUS
  Administered 2018-11-25: 0.3 mg via INTRAVENOUS
  Administered 2018-11-25: 2.7 mg via INTRAVENOUS
  Administered 2018-11-25: 1.5 mg via INTRAVENOUS
  Administered 2018-11-25: 1.89 mg via INTRAVENOUS
  Administered 2018-11-26: 1.8 mg via INTRAVENOUS
  Administered 2018-11-26: 0.6 mg via INTRAVENOUS
  Filled 2018-11-24: qty 30

## 2018-11-24 MED ORDER — LORATADINE 10 MG PO TABS: 10.0000 mg | ORAL_TABLET | Freq: Every day | ORAL | Status: DC | PRN

## 2018-11-24 MED ORDER — ROCURONIUM BROMIDE 10 MG/ML (PF) SYRINGE
PREFILLED_SYRINGE | INTRAVENOUS | Status: DC | PRN
  Administered 2018-11-24: 30 mg via INTRAVENOUS
  Administered 2018-11-24: 10 mg via INTRAVENOUS
  Administered 2018-11-24: 30 mg via INTRAVENOUS
  Administered 2018-11-24: 50 mg via INTRAVENOUS

## 2018-11-24 MED ORDER — FENTANYL CITRATE (PF) 100 MCG/2ML IJ SOLN
25.0000 ug | INTRAMUSCULAR | Status: DC | PRN
  Administered 2018-11-24 (×2): 50 ug via INTRAVENOUS

## 2018-11-24 MED ORDER — METOCLOPRAMIDE HCL 5 MG/ML IJ SOLN: 10.0000 mg | Freq: Once | INTRAMUSCULAR | Status: DC | PRN

## 2018-11-24 MED ORDER — POTASSIUM CHLORIDE CRYS ER 20 MEQ PO TBCR: 20.0000 meq | EXTENDED_RELEASE_TABLET | Freq: Every day | ORAL | Status: DC | PRN

## 2018-11-24 MED ORDER — ONDANSETRON HCL 4 MG/2ML IJ SOLN
INTRAMUSCULAR | Status: DC | PRN
  Administered 2018-11-24: 4 mg via INTRAVENOUS

## 2018-11-24 MED ORDER — PROPOFOL 10 MG/ML IV BOLUS
INTRAVENOUS | Status: DC | PRN
  Administered 2018-11-24: 150 mg via INTRAVENOUS

## 2018-11-24 MED ORDER — METFORMIN HCL 500 MG PO TABS
1000.0000 mg | ORAL_TABLET | Freq: Two times a day (BID) | ORAL | Status: DC
  Administered 2018-11-24 – 2018-11-26 (×4): 1000 mg via ORAL
  Filled 2018-11-24 (×4): qty 2

## 2018-11-24 MED ORDER — MIDAZOLAM HCL 2 MG/2ML IJ SOLN
INTRAMUSCULAR | Status: DC | PRN
  Administered 2018-11-24: 2 mg via INTRAVENOUS

## 2018-11-24 MED ORDER — DOCUSATE SODIUM 100 MG PO CAPS
100.0000 mg | ORAL_CAPSULE | Freq: Two times a day (BID) | ORAL | Status: DC
  Administered 2018-11-24 – 2018-11-26 (×4): 100 mg via ORAL
  Filled 2018-11-24 (×4): qty 1

## 2018-11-24 MED ORDER — IOHEXOL 300 MG/ML  SOLN
30.0000 mL | Freq: Once | INTRAMUSCULAR | Status: AC | PRN
  Administered 2018-11-24: 12:00:00 30 mL

## 2018-11-24 MED ORDER — PHENYLEPHRINE 40 MCG/ML (10ML) SYRINGE FOR IV PUSH (FOR BLOOD PRESSURE SUPPORT)
PREFILLED_SYRINGE | INTRAVENOUS | Status: DC | PRN
  Administered 2018-11-24 (×3): 80 ug via INTRAVENOUS

## 2018-11-24 MED ORDER — SUCCINYLCHOLINE CHLORIDE 200 MG/10ML IV SOSY
PREFILLED_SYRINGE | INTRAVENOUS | Status: DC | PRN
  Administered 2018-11-24: 140 mg via INTRAVENOUS

## 2018-11-24 MED ORDER — SODIUM CHLORIDE 0.9% FLUSH: 9.0000 mL | INTRAVENOUS | Status: DC | PRN

## 2018-11-24 MED ORDER — FENTANYL CITRATE (PF) 100 MCG/2ML IJ SOLN
INTRAMUSCULAR | Status: AC
  Filled 2018-11-24: qty 2

## 2018-11-24 MED ORDER — MIDAZOLAM HCL 2 MG/2ML IJ SOLN
INTRAMUSCULAR | Status: AC
  Filled 2018-11-24: qty 2

## 2018-11-24 MED ORDER — LIDOCAINE 2% (20 MG/ML) 5 ML SYRINGE
INTRAMUSCULAR | Status: DC | PRN
  Administered 2018-11-24: 100 mg via INTRAVENOUS

## 2018-11-24 MED ORDER — RAMIPRIL 10 MG PO CAPS
10.0000 mg | ORAL_CAPSULE | Freq: Two times a day (BID) | ORAL | Status: DC
  Administered 2018-11-24 – 2018-11-26 (×4): 10 mg via ORAL
  Filled 2018-11-24 (×4): qty 1

## 2018-11-24 MED ORDER — GLYCOPYRROLATE 0.2 MG/ML IJ SOLN
INTRAMUSCULAR | Status: DC | PRN
  Administered 2018-11-24: 0.2 mg via INTRAVENOUS

## 2018-11-24 MED ORDER — FUROSEMIDE 40 MG PO TABS: 40.0000 mg | ORAL_TABLET | Freq: Two times a day (BID) | ORAL | Status: DC | PRN

## 2018-11-24 MED ORDER — IOHEXOL 300 MG/ML  SOLN
75.0000 mL | Freq: Once | INTRAMUSCULAR | Status: AC | PRN
  Administered 2018-11-24: 12:00:00 75 mL via INTRAVENOUS

## 2018-11-24 MED ORDER — DIPHENHYDRAMINE HCL 50 MG/ML IJ SOLN: 12.5000 mg | Freq: Four times a day (QID) | INTRAMUSCULAR | Status: DC | PRN

## 2018-11-24 MED ORDER — FENTANYL CITRATE (PF) 250 MCG/5ML IJ SOLN
INTRAMUSCULAR | Status: AC
  Filled 2018-11-24: qty 5

## 2018-11-24 MED ORDER — FENTANYL CITRATE (PF) 250 MCG/5ML IJ SOLN
INTRAMUSCULAR | Status: DC | PRN
  Administered 2018-11-24 (×5): 50 ug via INTRAVENOUS

## 2018-11-24 MED ORDER — ALBUTEROL SULFATE (2.5 MG/3ML) 0.083% IN NEBU
2.5000 mg | INHALATION_SOLUTION | Freq: Four times a day (QID) | RESPIRATORY_TRACT | Status: DC | PRN
  Administered 2018-11-24: 2.5 mg via RESPIRATORY_TRACT

## 2018-11-24 MED ORDER — PIPERACILLIN-TAZOBACTAM 3.375 G IVPB 30 MIN
3.3750 g | Freq: Once | INTRAVENOUS | Status: AC
  Administered 2018-11-24: 3.375 g via INTRAVENOUS
  Filled 2018-11-24: qty 50

## 2018-11-24 MED ORDER — DIPHENHYDRAMINE HCL 12.5 MG/5ML PO ELIX
12.5000 mg | ORAL_SOLUTION | Freq: Four times a day (QID) | ORAL | Status: DC | PRN
  Filled 2018-11-24: qty 5

## 2018-11-24 MED ORDER — SODIUM CHLORIDE 0.9 % IV SOLN: INTRAVENOUS | Status: DC

## 2018-11-24 MED ORDER — IOHEXOL 300 MG/ML  SOLN
75.0000 mL | Freq: Once | INTRAMUSCULAR | Status: AC | PRN
  Administered 2018-11-24: 75 mL via INTRAVENOUS

## 2018-11-24 MED ORDER — METOPROLOL SUCCINATE ER 50 MG PO TB24
50.0000 mg | ORAL_TABLET | Freq: Every day | ORAL | Status: DC
  Administered 2018-11-25 – 2018-11-26 (×2): 50 mg via ORAL
  Filled 2018-11-24 (×2): qty 1

## 2018-11-24 NOTE — Procedures (Signed)
Pre procedural Dx: Biopsy proven [place]  Post procedural Dx: Same  Technically successful image guided hepatic microwave ablation.   EBL: Trace Complications: None immediate.   Ronny Bacon, MD Pager #: [phone]

## 2018-11-24 NOTE — Progress Notes (Signed)
Patient ID: Steve Jensen, male   DOB: July 01, 1940, 78 y.o.   MRN: [id number] Pt doing ok; denies fever,N/V or resp issues; only minimal RUQ soreness VSS; afebrile Awake/alert;puncture site RUQ abd clean and dry, no palpable hematoma  A/P: s/p CT guided MWA seg 6 HCC earlier today; for overnight obs; on dilaudid PCA prn pain; d/c foley later this pm; f/u with Dr. Pascal Lux either via phone or in IR clinic in 3-4 weeks; f/u imaging in 3 months; resume eliquis on 7/3 pm   [place]

## 2018-11-24 NOTE — Anesthesia Postprocedure Evaluation (Signed)
Anesthesia Post Note  Patient: Steve Jensen  Procedure(s) Performed: MICROWAVE THERMAL ABLATION LIVER (N/A )     Patient location during evaluation: PACU Anesthesia Type: General Level of consciousness: awake and alert Pain management: pain level controlled Vital Signs Assessment: post-procedure vital signs reviewed and stable Respiratory status: spontaneous breathing, nonlabored ventilation, respiratory function stable and patient connected to nasal cannula oxygen Cardiovascular status: blood pressure returned to baseline and stable Postop Assessment: no apparent nausea or vomiting Anesthetic complications: no    Last Vitals:  Vitals:   11/24/18 1431 11/24/18 1537  BP: (!) 141/69 (!) 128/59  Pulse: 72 66  Resp: 14 15  Temp: 36.7 C 36.6 C  SpO2: 91% 94%    Last Pain:  Vitals:   11/24/18 1537  TempSrc: Oral  PainSc: 3                  Montez Hageman

## 2018-11-24 NOTE — Sedation Documentation (Signed)
Anesthesia in to sedate and monitor. 

## 2018-11-24 NOTE — H&P (Signed)
Referring Physician(s): Feng,Y  Supervising Physician: Sandi Mariscal  Patient Status:  [facility] TBA  Chief Complaint: Hepatocellular carcinoma   Subjective: Patient familiar to IR service from right liver lesion biopsy on 12/22/2017, right hepatocellular carcinoma bland embolization on 02/15/2018 and recent tele consultation on 6/4 with Dr. Pascal Lux to discuss further treatment options for the persistent  hepatocellular carcinoma in segment 6.  He was deemed an appropriate candidate for microwave ablation of the [facility] and presents today for the procedure.  He currently denies fever, headache, chest pain, dyspnea, cough, abdominal/back pain, nausea, vomiting or bleeding.  Additional medical history as below.  He has been off his Eliquis for several days.  Past Medical History:  Diagnosis Date  . Anemia   . Diabetes mellitus type II, controlled ([place])    with neuropathy  . Dysrhythmia    A-Fib. cardioversion done  . GERD (gastroesophageal reflux disease)   . Hyperlipidemia   . Hypertension   . Morbid obesity ([place])   . Neuromuscular disorder ([place])    feet  . OSA (obstructive sleep apnea)    On BiPAP at 15/11cm H2O  . Prostate cancer ([place])   . Shingles    on face Nov 2010  . Urinary incontinence    Past Surgical History:  Procedure Laterality Date  . CARDIOVERSION N/A 09/06/2013   Procedure: CARDIOVERSION;  Surgeon: [name], MD;  Location: [facility] ENDOSCOPY;  Service: Cardiovascular;  Laterality: N/A;  . EYE SURGERY     cataract surgery bilat; surgery to correct droopy eyelids   . IR ANGIOGRAM SELECTIVE EACH ADDITIONAL VESSEL  02/15/2018  . IR ANGIOGRAM SELECTIVE EACH ADDITIONAL VESSEL  02/15/2018  . IR ANGIOGRAM SELECTIVE EACH ADDITIONAL VESSEL  02/15/2018  . IR ANGIOGRAM SELECTIVE EACH ADDITIONAL VESSEL  02/15/2018  . IR ANGIOGRAM SELECTIVE EACH ADDITIONAL VESSEL  02/15/2018  . IR ANGIOGRAM VISCERAL SELECTIVE  02/15/2018  . IR ANGIOGRAM VISCERAL SELECTIVE  02/15/2018  . IR EMBO  TUMOR ORGAN ISCHEMIA INFARCT INC GUIDE ROADMAPPING  02/15/2018  . IR RADIOLOGIST EVAL & MGMT  01/26/2018  . IR RADIOLOGIST EVAL & MGMT  03/17/2018  . IR RADIOLOGIST EVAL & MGMT  06/29/2018  . IR RADIOLOGIST EVAL & MGMT  07/27/2018  . IR RADIOLOGIST EVAL & MGMT  10/28/2018  . IR US GUIDE VASC ACCESS RIGHT  02/15/2018  . knee surgery bilat     . PROSTATE SURGERY    . right shoulder rotator cuff surgery    . TONSILLECTOMY         Allergies: Lipitor [atorvastatin], Simvastatin, and Zithromax [azithromycin]  Medications: Prior to Admission medications   Medication Sig Start Date End Date Taking? Authorizing Provider  CINNAMON PO Take 1,000 mg by mouth daily.   Yes [provider]  diltiazem (CARDIZEM CD) 240 MG 24 hr capsule Take 1 capsule (240 mg total) by mouth daily. Please keep upcoming appt in January with Dr. Irish Lack for future refills. Thank you 08/02/18  Yes [name], MD  fish oil-omega-3 fatty acids 1000 MG capsule Take 2 g by mouth 2 (two) times daily.    Yes [provider]  furosemide (LASIX) 40 MG tablet TAKE 1 TABLET BY MOUTH TWICE DAILY AS NEEDED Patient taking differently: Take 40 mg by mouth 2 (two) times daily as needed for edema. TAKE 1 TABLET BY MOUTH TWICE DAILY AS NEEDED 06/18/18  Yes [name], MD  ibuprofen (ADVIL,MOTRIN) 200 MG tablet Take 800 mg by mouth every 6 (six) hours  as needed (PAIN). prn 06/30/13  Yes [provider]  loratadine (CLARITIN) 10 MG tablet Take 10 mg by mouth daily as needed for allergies.    Yes [provider]  metFORMIN (GLUCOPHAGE) 1000 MG tablet Take 1,000 mg by mouth 2 (two) times daily. 06/27/14  Yes [provider]  metoprolol succinate (TOPROL-XL) 50 MG 24 hr tablet Take 50 mg by mouth daily.  08/10/13  Yes [provider]  Multiple Vitamins-Minerals (MULTIVITAMIN WITH MINERALS) tablet Take 1 tablet by mouth daily.   Yes [provider]  omeprazole (PRILOSEC) 20 MG  capsule Take 20 mg by mouth daily.  11/29/12  Yes [provider]  oxymetazoline (AFRIN) 0.05 % nasal spray Place 1 spray into both nostrils 2 (two) times daily as needed for congestion.   Yes [provider]  potassium chloride SA (K-DUR,KLOR-CON) 20 MEQ tablet TAKE 1 TABLET BY MOUTH EVERY DAY AS NEEDED WHILE TAKING FUROSEMIDE 06/18/18  Yes [name], MD  pregabalin (LYRICA) 100 MG capsule Take 100 mg by mouth 2 (two) times a day.  09/14/18  Yes [provider]  ramipril (ALTACE) 10 MG capsule Take 10 mg by mouth 2 (two) times daily. 01/11/13  Yes [provider]  Semaglutide (OZEMPIC) 1 MG/DOSE SOPN Inject 1 mg into the skin once a week. Monday    Yes [provider]  ZETIA 10 MG tablet Take 10 mg by mouth daily.  01/17/13  Yes [provider]  apixaban (ELIQUIS) 5 MG TABS tablet TAKE 1 TABLET(5 MG) BY MOUTH TWICE DAILY 11/15/18   [name], MD     Vital Signs: BP 131/85   Pulse 82   Temp 98.1 F (36.7 C) (Oral)   Resp 20   Ht 5\' 11"  (1.803 m)   Wt (!) 305 lb (138.3 kg)   SpO2 95%   BMI 42.54 kg/m   Physical Exam awake, alert.  Chest clear to auscultation bilaterally.  Heart with regular rate and rhythm.  Abdomen obese, soft, positive bowel sounds, nontender.  Bilateral pretibial edema noted.  Imaging: No results found.  Labs:  CBC: Recent Labs    12/30/17 1606 02/15/18 1250 07/02/18 1448 11/19/18 1619  WBC 10.9* 10.3 12.3* 9.2  HGB 15.2 15.0 14.6 14.6  HCT 44.7 43.2 42.5 44.1  PLT 343 318 377 280    COAGS: Recent Labs    12/22/17 0615 12/30/17 1606 02/15/18 1250 11/19/18 1619  INR 0.99 1.08 0.95 1.1  APTT  --   --   --  40*    BMP: Recent Labs    02/15/18 1250 03/11/18 1345 07/02/18 1448 10/25/18 1502 11/19/18 1619  NA 144 140 142  --  143  K 3.6 4.1 3.8  --  3.6  CL 102 101 103  --  102  CO2 29 25 27   --  27  GLUCOSE 132* 174* 122*  --  206*  BUN 13 12 13   --  17  CALCIUM 9.5 10.0  9.5  --  9.7  CREATININE 1.01 0.95 1.08 1.10 1.13  GFRNONAA >60 77 >60  --  >60  GFRAA >60 90 >60  --  >60    LIVER FUNCTION TESTS: Recent Labs    12/30/17 1606 02/15/18 1250 03/11/18 1345 07/02/18 1448 11/19/18 1619  BILITOT 0.5 0.7 0.3 0.7 0.8  AST 44* 37 44* 41 35  ALT 59* 42 42 41 39  ALKPHOS 105 91  --  109 97  PROT 7.3 7.2  6.4 6.9 7.4  ALBUMIN 3.9 4.0  --  3.7 4.1    Assessment and Plan: Patient with history of segment 6 hepatocellular carcinoma with prior bland embolization on 02/15/2018; status post consultation with Dr. Pascal Lux on 10/28/2018 to discuss treatment options and deemed an appropriate candidate for CT-guided microwave ablation of the persistent HCC.  Details/risks of procedure, including but not limited to, internal bleeding, infection, injury to adjacent structures, anesthesia related complications discussed with patient with his understanding and consent.  Postprocedure he will be admitted for overnight observation.This procedure involves the use of X-rays and because of the nature of the planned procedure, it is possible that we will have prolonged use of X-ray fluoroscopy.  Potential radiation risks to you include (but are not limited to) the following: - A slightly elevated risk for cancer  several years later in life. This risk is typically less than 0.5% percent. This risk is low in comparison to the normal incidence of human cancer, which is 33% for women and 50% for men according to the [place]. - Radiation induced injury can include skin redness, resembling a rash, tissue breakdown / ulcers and hair loss (which can be temporary or permanent).   The likelihood of either of these occurring depends on the difficulty of the procedure and whether you are sensitive to radiation due to previous procedures, disease, or genetic conditions.   IF your procedure requires a prolonged use of radiation, you will be notified and given written instructions  for further action.  It is your responsibility to monitor the irradiated area for the 2 weeks following the procedure and to notify your physician if you are concerned that you have suffered a radiation induced injury.      Electronically Signed:  D. Rowe Robert, PA-C 11/24/2018, 8:19 AM   I spent a total of 30 minutes at the the patient's bedside AND on the patient's hospital floor or unit, greater than 50% of which was counseling/coordinating care for CT-guided microwave ablation of segment 6 hepatocellular carcinoma

## 2018-11-24 NOTE — Anesthesia Procedure Notes (Signed)
Procedure Name: Intubation Date/Time: 11/24/2018 8:36 AM Performed by: Sharlette Dense, CRNA Patient Re-evaluated:Patient Re-evaluated prior to induction Oxygen Delivery Method: Circle system utilized Preoxygenation: Pre-oxygenation with 100% oxygen Induction Type: IV induction and Rapid sequence Laryngoscope Size: Miller and 3 Grade View: Grade I Tube type: Oral Tube size: 8.0 mm Number of attempts: 1 Airway Equipment and Method: Stylet Placement Confirmation: ETT inserted through vocal cords under direct vision,  positive ETCO2 and breath sounds checked- equal and bilateral Secured at: 22 cm Tube secured with: Tape Dental Injury: Teeth and Oropharynx as per pre-operative assessment

## 2018-11-24 NOTE — Transfer of Care (Signed)
Immediate Anesthesia Transfer of Care Note  Patient: Steve Jensen  Procedure(s) Performed: MICROWAVE THERMAL ABLATION LIVER (N/A )  Patient Location: PACU  Anesthesia Type:General  Level of Consciousness: awake  Airway & Oxygen Therapy: Patient Spontanous Breathing and Patient connected to face mask oxygen  Post-op Assessment: Report given to RN and Post -op Vital signs reviewed and stable  Post vital signs: Reviewed and stable  Last Vitals:  Vitals Value Taken Time  BP 134/75 11/24/18 1201  Temp    Pulse 77 11/24/18 1203  Resp 20 11/24/18 1203  SpO2 89 % 11/24/18 1203  Vitals shown include unvalidated device data.  Last Pain:  Vitals:   11/24/18 0743  TempSrc: Oral  PainSc:          Complications: No apparent anesthesia complications

## 2018-11-25 ENCOUNTER — Encounter (HOSPITAL_COMMUNITY): Payer: Self-pay | Admitting: Interventional Radiology

## 2018-11-25 DIAGNOSIS — C220 Liver cell carcinoma: Secondary | ICD-10-CM | POA: Diagnosis not present

## 2018-11-25 LAB — CBC WITH DIFFERENTIAL/PLATELET
Abs Immature Granulocytes: 0.07 10*3/uL (ref 0.00–0.07)
Basophils Absolute: 0 10*3/uL (ref 0.0–0.1)
Basophils Relative: 0 %
Eosinophils Absolute: 0 10*3/uL (ref 0.0–0.5)
Eosinophils Relative: 0 %
HCT: 42.3 % (ref 39.0–52.0)
Hemoglobin: 13.6 g/dL (ref 13.0–17.0)
Immature Granulocytes: 1 %
Lymphocytes Relative: 9 %
Lymphs Abs: 1.4 10*3/uL (ref 0.7–4.0)
MCH: 29.1 pg (ref 26.0–34.0)
MCHC: 32.2 g/dL (ref 30.0–36.0)
MCV: 90.4 fL (ref 80.0–100.0)
Monocytes Absolute: 1.5 10*3/uL — ABNORMAL HIGH (ref 0.1–1.0)
Monocytes Relative: 10 %
Neutro Abs: 12 10*3/uL — ABNORMAL HIGH (ref 1.7–7.7)
Neutrophils Relative %: 80 %
Platelets: 239 10*3/uL (ref 150–400)
RBC: 4.68 MIL/uL (ref 4.22–5.81)
RDW: 14 % (ref 11.5–15.5)
WBC: 15 10*3/uL — ABNORMAL HIGH (ref 4.0–10.5)
nRBC: 0 % (ref 0.0–0.2)

## 2018-11-25 LAB — COMPREHENSIVE METABOLIC PANEL
ALT: 94 U/L — ABNORMAL HIGH (ref 0–44)
AST: 119 U/L — ABNORMAL HIGH (ref 15–41)
Albumin: 3.9 g/dL (ref 3.5–5.0)
Alkaline Phosphatase: 78 U/L (ref 38–126)
Anion gap: 12 (ref 5–15)
BUN: 16 mg/dL (ref 8–23)
CO2: 27 mmol/L (ref 22–32)
Calcium: 8.8 mg/dL — ABNORMAL LOW (ref 8.9–10.3)
Chloride: 103 mmol/L (ref 98–111)
Creatinine, Ser: 1.18 mg/dL (ref 0.61–1.24)
GFR calc Af Amer: >60 mL/min (ref >60)
GFR calc non Af Amer: 60 mL/min — ABNORMAL LOW (ref >60)
Glucose, Bld: 163 mg/dL — ABNORMAL HIGH (ref 70–99)
Potassium: 4.1 mmol/L (ref 3.5–5.1)
Sodium: 142 mmol/L (ref 135–145)
Total Bilirubin: 1.1 mg/dL (ref 0.3–1.2)
Total Protein: 6.5 g/dL (ref 6.5–8.1)

## 2018-11-25 LAB — GLUCOSE, CAPILLARY
Glucose-Capillary: 204 mg/dL — ABNORMAL HIGH (ref 70–99)
Glucose-Capillary: 164 mg/dL — ABNORMAL HIGH (ref 70–99)

## 2018-11-25 MED ORDER — OXYCODONE-ACETAMINOPHEN 5-325 MG PO TABS
1.0000 | ORAL_TABLET | ORAL | Status: DC | PRN
  Administered 2018-11-26: 1 via ORAL
  Filled 2018-11-25: qty 1

## 2018-11-25 NOTE — Progress Notes (Signed)
IR.  Hepatocellular carcinoma s/p percutaneous thermal ablation 11/24/2018 by Dr. Pascal Lux.   Patient awake and alert sitting in chair. States that his RUQ/right lateral chest wall pain has slightly improved, rates pain as 6/7-10 (from 7/10 earlier this AM). Still using PCA for pain control. States that his fatigue has significantly improved since this AM. Asking to stay another night for pain control.  On PE, RUQ/right lateral chest wall at site of incision with mild tenderness; no erythema, active bleeding, or drainage noted- no change from this AM.  Discussed case with Dr. Pascal Lux who recommends patient stay an additional night for pain control. Continue with Dilaudid PCA overnight and D/C at 0700 tomorrow AM- at that time switch to Percocet 5-325 Q4H PRN moderate-severe pain.  Plan for possible discharge tomorrow if pain controlled. IR to follow.   Bea Graff Melina Mosteller, PA-C 11/25/2018, 4:06 PM

## 2018-11-25 NOTE — Progress Notes (Signed)
Referring Physician(s): Truitt Merle  Supervising Physician: Sandi Mariscal  Patient Status:  [facility] - In-pt  Chief Complaint: "I don't feel right" and "side pain"  Subjective:  Hepatocellular carcinoma s/p percutaneous thermal ablation 11/24/2018 by Dr. Pascal Lux.  Patient awake and alert laying in bed. Complains of RUQ/right lateral chest wall pain at site of incision, rated 7/10 at this time. States "I don't feel right"- further questioning lead to patient stating that he feels fatigue since procedure. RUQ/right lateral chest wall at site of incision c/d/i.   Allergies: Lipitor [atorvastatin], Simvastatin, and Zithromax [azithromycin]  Medications: Prior to Admission medications   Medication Sig Start Date End Date Taking? Authorizing Provider  CINNAMON PO Take 1,000 mg by mouth daily.   Yes [provider]  diltiazem (CARDIZEM CD) 240 MG 24 hr capsule Take 1 capsule (240 mg total) by mouth daily. Please keep upcoming appt in January with Dr. Irish Lack for future refills. Thank you 08/02/18  Yes [name], MD  fish oil-omega-3 fatty acids 1000 MG capsule Take 2 g by mouth 2 (two) times daily.    Yes [provider]  furosemide (LASIX) 40 MG tablet TAKE 1 TABLET BY MOUTH TWICE DAILY AS NEEDED Patient taking differently: Take 40 mg by mouth 2 (two) times daily as needed for edema. TAKE 1 TABLET BY MOUTH TWICE DAILY AS NEEDED 06/18/18  Yes [name], MD  ibuprofen (ADVIL,MOTRIN) 200 MG tablet Take 800 mg by mouth every 6 (six) hours as needed (PAIN). prn 06/30/13  Yes [provider]  loratadine (CLARITIN) 10 MG tablet Take 10 mg by mouth daily as needed for allergies.    Yes [provider]  metFORMIN (GLUCOPHAGE) 1000 MG tablet Take 1,000 mg by mouth 2 (two) times daily. 06/27/14  Yes [provider]  metoprolol succinate (TOPROL-XL) 50 MG 24 hr tablet Take 50 mg by mouth daily.  08/10/13  Yes [provider]  Multiple  Vitamins-Minerals (MULTIVITAMIN WITH MINERALS) tablet Take 1 tablet by mouth daily.   Yes [provider]  omeprazole (PRILOSEC) 20 MG capsule Take 20 mg by mouth daily.  11/29/12  Yes [provider]  oxymetazoline (AFRIN) 0.05 % nasal spray Place 1 spray into both nostrils 2 (two) times daily as needed for congestion.   Yes [provider]  potassium chloride SA (K-DUR,KLOR-CON) 20 MEQ tablet TAKE 1 TABLET BY MOUTH EVERY DAY AS NEEDED WHILE TAKING FUROSEMIDE 06/18/18  Yes [name], MD  pregabalin (LYRICA) 100 MG capsule Take 100 mg by mouth 2 (two) times a day.  09/14/18  Yes [provider]  ramipril (ALTACE) 10 MG capsule Take 10 mg by mouth 2 (two) times daily. 01/11/13  Yes [provider]  Semaglutide (OZEMPIC) 1 MG/DOSE SOPN Inject 1 mg into the skin once a week. Monday    Yes [provider]  ZETIA 10 MG tablet Take 10 mg by mouth daily.  01/17/13  Yes [provider]  apixaban (ELIQUIS) 5 MG TABS tablet TAKE 1 TABLET(5 MG) BY MOUTH TWICE DAILY 11/15/18   [name], MD     Vital Signs: BP 130/79   Pulse 87   Temp 99.1 F (37.3 C) (Oral)   Resp 18   Ht 5\' 11"  (1.803 m)   Wt (!) 305 lb (138.3 kg)   SpO2 92%   BMI 42.54 kg/m   Physical Exam Vitals signs and nursing note reviewed.  Constitutional:      General: He  is not in acute distress.    Appearance: Normal appearance.  Pulmonary:     Effort: Pulmonary effort is normal. No respiratory distress.  Abdominal:     Palpations: Abdomen is soft.     Comments: RUQ/right lateral chest wall at site of incision with mild tenderness; no erythema, active bleeding, or drainage noted.  Skin:    General: Skin is warm and dry.  Neurological:     Mental Status: He is alert and oriented to person, place, and time.  Psychiatric:        Mood and Affect: Mood normal.        Behavior: Behavior normal.        Thought Content: Thought content normal.        Judgment:  Judgment normal.     Imaging: Dg Chest 1 View  Result Date: 11/24/2018 CLINICAL DATA:  History of prostate cancer, hypertension. EXAM: CHEST  1 VIEW COMPARISON:  Radiographs of December 05, 2013. FINDINGS: The heart size and mediastinal contours are within normal limits. No pneumothorax is noted. Minimal left pleural effusion is noted. Mild right basilar atelectasis or scarring is noted. The visualized skeletal structures are unremarkable. IMPRESSION: Mild right basilar subsegmental atelectasis or scarring is noted. Minimal left pleural effusion is noted. Electronically Signed   By: Marijo Conception M.D.   On: 11/24/2018 09:19   Ct Guide Tissue Ablation  Result Date: 11/24/2018 INDICATION: History of biopsy-proven hepatocellular carcinoma post bland embolization performed 02/15/2018 who presents today for image guided hepatic microwave ablation. EXAM: CT-GUIDED PERCUTANEOUS THERMAL ABLATION OF LIVER LESION ANESTHESIA/SEDATION: General MEDICATIONS: Zosyn 3.375 g IV. The antibiotic was administered in an appropriate time interval prior to needle puncture of the skin. CONTRAST:  A total of 150 cc Omnipaque 300 PROCEDURE: The procedure, risks, benefits, and alternatives were explained to the patient. Questions regarding the procedure were encouraged and answered. The patient understands and consents to the procedure. The patient was placed under general anesthesia. Initial unenhanced CT was performed in a supine, slightly LPO position to localize ill-defined lesion within the subcapsular aspect of the posterior segment of the right lobe liver. Ill-defined lesion was also identified sonographically, though note, sonographic evaluation is degraded secondary to patient body habitus. The procedure was planned. The patient was prepped with Betadine in a sterile fashion, and a sterile drape was applied covering the operative field. A sterile gown and sterile gloves were used for the procedure. A 22 gauge spinal needle was  advanced in expected trajectory of the microwave ablation probe for procedural planning. Once the appropriate trajectory was established, intravenous contrast was administered to ensure adequate lesion visualization. Next, two 20 cm length NeuWave PR microwave ablation probes were advanced into the hypoattenuating hepatic lesion under intermittent CT and ultrasound guidance. Once appropriate positioning was confirmed, both probes were locked in place. Next, an 18 gauge trocar needle was advanced adjacent to the peripheral aspect the right lobe of the liver and hydrodissection (saline and dilute contrast) was administered. Next, a 10-minute ablation was performed at 82 W with intra ablation imaging performed at approximately 6 and 10 minutes. Tract ablation was performed as the microwave ablation probe was removed. Completion contrast-enhanced arterial and portal venous phase imaging was obtained and the procedure was terminated Ablation access sites were anesthetized with 1% lidocaine with epinephrine. Superficial hemostasis was achieved with manual compression. Dressings were applied. The patient tolerated the procedure well without immediate postprocedural complication. COMPLICATIONS: None immediate. FINDINGS: Preprocedural imaging demonstrates ill-defined bilobed hypoattenuating lesion  within the posterior inferior aspect of the right lobe of the liver with dominant cranial component measuring approximately 2.5 x 2.5 cm (image 67, series 2) and caudal component measuring approximately 2.3 x 2.3 cm (image 69). Under CT and ultrasound guidance, microwave ablation probes were placed bracketing the cranial and caudal aspect of the ill-defined hepatic lesion. Post ablation imaging demonstrates an adequate ablation zone encompassing the entirety of the ill-defined infiltrative lesion within the posterior inferior aspect the right lobe of the liver and was negative for obvious complication, specifically, no pneumothorax  or significant hemorrhage about the ablation site. IMPRESSION: Technically successful ultrasound and CT guided percutaneous thermal ablation of infiltrative lesion within the posterior inferior aspect of the right lobe of the liver. PLAN: - The patient will be admitted overnight for continued observation PCA usage - The patient was seen in the interventional radiology clinic (either in person or via tele conference) in 3-4 weeks. - Initial postprocedural imaging will be obtained in 3 months (October 2020). Electronically Signed   By: Sandi Mariscal M.D.   On: 11/24/2018 13:22    Labs:  CBC: Recent Labs    02/15/18 1250 07/02/18 1448 11/19/18 1619 11/25/18 0402  WBC 10.3 12.3* 9.2 15.0*  HGB 15.0 14.6 14.6 13.6  HCT 43.2 42.5 44.1 42.3  PLT 318 377 280 239    COAGS: Recent Labs    12/22/17 0615 12/30/17 1606 02/15/18 1250 11/19/18 1619  INR 0.99 1.08 0.95 1.1  APTT  --   --   --  40*    BMP: Recent Labs    03/11/18 1345 07/02/18 1448 10/25/18 1502 11/19/18 1619 11/25/18 0402  NA 140 142  --  143 142  K 4.1 3.8  --  3.6 4.1  CL 101 103  --  102 103  CO2 25 27  --  27 27  GLUCOSE 174* 122*  --  206* 163*  BUN 12 13  --  17 16  CALCIUM 10.0 9.5  --  9.7 8.8*  CREATININE 0.95 1.08 1.10 1.13 1.18  GFRNONAA 77 >60  --  >60 60*  GFRAA 90 >60  --  >60 >60    LIVER FUNCTION TESTS: Recent Labs    02/15/18 1250 03/11/18 1345 07/02/18 1448 11/19/18 1619 11/25/18 0402  BILITOT 0.7 0.3 0.7 0.8 1.1  AST 37 44* 41 35 119*  ALT 42 42 41 39 94*  ALKPHOS 91  --  109 97 78  PROT 7.2 6.4 6.9 7.4 6.5  ALBUMIN 4.0  --  3.7 4.1 3.9    Assessment and Plan:  Hepatocellular carcinoma s/p percutaneous thermal ablation 11/24/2018 by Dr. Pascal Lux.  Patient still with RUQ/right lateral chest wall pain at incision site (7/10)- continue with Dilaudid PCA, will reassess later for possible switch to PO pain medications. Fatigue probably secondary to general anesthesia administration. WBCs  and LFTs elevated as expected. Continue to monitor today- will reassess for possible discharge later this afternoon. IR to follow.   Electronically Signed: Earley Abide, PA-C 11/25/2018, 9:56 AM   I spent a total of 35 Minutes at the the patient's bedside AND on the patient's hospital floor or unit, greater than 50% of which was counseling/coordinating care for hepatocellular carcinoma s/p thermal ablation.

## 2018-11-26 DIAGNOSIS — C220 Liver cell carcinoma: Secondary | ICD-10-CM | POA: Diagnosis not present

## 2018-11-26 MED ORDER — OXYCODONE-ACETAMINOPHEN 5-325 MG PO TABS: 1.0000 | ORAL_TABLET | Freq: Four times a day (QID) | ORAL | 0 refills | Status: DC | PRN

## 2018-11-26 NOTE — Discharge Summary (Signed)
Patient ID: KARANVEER RAMAKRISHNAN MRN: [id number] DOB/AGE: 77/16/1942 77 y.o.  Admit date: 11/24/2018 Discharge date: 11/26/2018  Supervising Physician: Jacqulynn Cadet  Patient Status: [facility] - In-pt  Admission Diagnoses: Hepatocellular carcinoma  Discharge Diagnoses:  Active Problems:   Steve Jensen (hepatocellular carcinoma) (Steve Jensen)   Discharged Condition: stable  Hospital Course:   Steve Jensen is a 78 year old male with history of anemia, DM Type II, GERD, HTN, neuromuscular disorder in his feet, and urinary incontinence who presented to IR yesterday for microwave ablation of his [place].  He underwent successful procedure, however experienced a slow recovery in PACU with increased pain.  He was admitted overnight for pain control and observation.  He is assessed this AM and found to be in stable condition.  He is incontinent of urine per his baseline. Reports some abdominal pain which is well-controlled with pain medication.  Tolerating breakfast.  Physical exam is without concern.  He did complain of some right knee and foot pain with ambulation, but was able to walk 68ft with a walker which is his baseline.  Patient is stable for discharge home today.  He is encouraged to use his walker at home.  If knee pain does not resolve with self care and conservative management (ice, heat, pain medication) he is instructed to call his PCP. He is given a prescription for Percocet 5/325 #20 Q6 hrs PRN for moderate to severe pain related to his procedure.  He should not drive while taking narcotic pain medication. He understands schedulers will call him with date and time of follow-up appointment.  Discharge Exam: Blood pressure (!) 146/73, pulse 86, temperature 99 F (37.2 C), temperature source Oral, resp. rate 18, height 5\' 11"  (1.803 m), weight (!) 305 lb (138.3 kg), SpO2 93 %. General appearance: alert, cooperative and no distress Resp: clear to auscultation bilaterally Cardio: regular rate and rhythm, S1,  S2 normal, no murmur, click, rub or gallop GI: soft, non-tender; bowel sounds normal; no masses,  no organomegaly Skin: Skin color, texture, turgor normal. No rashes or lesions Incision/Wound: Puncture site I/c/d.  Disposition: Discharge disposition: 01-Home or Self Care       Discharge Instructions    Call MD for:  difficulty breathing, headache or visual disturbances   Complete by: As directed    Call MD for:  persistant nausea and vomiting   Complete by: As directed    Call MD for:  redness, tenderness, or signs of infection (pain, swelling, redness, odor or green/yellow discharge around incision site)   Complete by: As directed    Call MD for:  severe uncontrolled pain   Complete by: As directed    Call MD for:  temperature >100.4   Complete by: As directed    Diet - low sodium heart healthy   Complete by: As directed    Discharge instructions   Complete by: As directed    May remove or replace bandage to abdomen as needed.  Do not submerge for 72 hrs.  Use walker at home for support when walking.  You will hear from schedulers with date and time of follow-up appointment with Dr. Pascal Lux.  Follow-up with your primary care doctor about knee and foot pain if it does not resolve with home/self care in a few days. May apply ice/heat, use ibuprofen if ok with your doctor for relief.   Increase activity slowly   Complete by: As directed       Follow-up Information    Sandi Mariscal,  MD Follow up.   Specialties: Interventional Radiology, Radiology Why: Schedulers will contact you with date and time of appointment. Contact information: [place] [address] [phone]            Electronically Signed: [name], PA 11/26/2018, 12:50 PM   I have spent Greater Than 30 Minutes discharging Steve Jensen.

## 2018-11-26 NOTE — Progress Notes (Signed)
Discharge instructions given to patient. Patient had no questions. NT and writer wheeled patient out to the front of the building. His daughter in law picked up.

## 2018-11-26 NOTE — Progress Notes (Signed)
Ambulated with patient with walker. Patient ambulated with walker 40feet. Patient c/o new onset of pain to right knee and right foot. Denies any gout or arthritis history. Writer notified [place] PA in [employer]

## 2018-11-29 LAB — GLUCOSE, CAPILLARY
Glucose-Capillary: 185 mg/dL — ABNORMAL HIGH (ref 70–99)
Glucose-Capillary: 174 mg/dL — ABNORMAL HIGH (ref 70–99)
Glucose-Capillary: 194 mg/dL — ABNORMAL HIGH (ref 70–99)

## 2018-12-14 ENCOUNTER — Other Ambulatory Visit: Payer: Medicare Other

## 2018-12-14 ENCOUNTER — Ambulatory Visit
Admission: RE | Admit: 2018-12-14 | Discharge: 2018-12-14 | Disposition: A | Discharge status: Home / Self Care | Payer: Medicare Other | Source: Ambulatory Visit | Attending: Student | Admitting: Student

## 2018-12-14 ENCOUNTER — Other Ambulatory Visit: Payer: Self-pay

## 2018-12-14 DIAGNOSIS — C220 Liver cell carcinoma: Secondary | ICD-10-CM

## 2018-12-14 HISTORY — PX: IR RADIOLOGIST EVAL & MGMT: IMG5224

## 2018-12-14 NOTE — Progress Notes (Signed)
Patient ID: Steve Jensen, male   DOB: [birth year], 78 y.o.   MRN: [id number]         Chief Complaint: Post microwave hepatic ablation performed 11/24/2018  Referring Physician(s): Burr Medico (Oncology  History of Present Illness:  Steve F Steve Jensen a 78 y.o.malewith past medical history significant for type 2 diabetes, hypertension, hyperlipidemia, morbid obesity with obstructive sleep apnea and prostate cancer who is seen in consultation via telemedicine following technically successful hepatic microwave ablation performed 11/24/2018 for biopsy-proven hepatocellular carcinoma after undergoing a successful transcatheter bland embolization on 02/15/2018.  Patient was discharged 2 days following the procedure and states that he did experience some postprocedural discomfort for approximately 1 week following the procedure, though has since recovered completely and is without complaint.  Specifically, no abdominal pain.  No fever or chills.  No change in appetite or energy level.    Past Medical History:  Diagnosis Date  . Anemia   . Diabetes mellitus type II, controlled ([place])    with neuropathy  . Dysrhythmia    A-Fib. cardioversion done  . GERD (gastroesophageal reflux disease)   . Hyperlipidemia   . Hypertension   . Morbid obesity ([place])   . Neuromuscular disorder ([place])    feet  . OSA (obstructive sleep apnea)    On BiPAP at 15/11cm H2O  . Prostate cancer ([place])   . Shingles    on face Nov 2010  . Urinary incontinence     Past Surgical History:  Procedure Laterality Date  . CARDIOVERSION N/A 09/06/2013   Procedure: CARDIOVERSION;  Surgeon: [name], MD;  Location: [facility] ENDOSCOPY;  Service: Cardiovascular;  Laterality: N/A;  . EYE SURGERY     cataract surgery bilat; surgery to correct droopy eyelids   . IR ANGIOGRAM SELECTIVE EACH ADDITIONAL VESSEL  02/15/2018  . IR ANGIOGRAM SELECTIVE EACH ADDITIONAL VESSEL  02/15/2018  . IR ANGIOGRAM SELECTIVE EACH ADDITIONAL VESSEL   02/15/2018  . IR ANGIOGRAM SELECTIVE EACH ADDITIONAL VESSEL  02/15/2018  . IR ANGIOGRAM SELECTIVE EACH ADDITIONAL VESSEL  02/15/2018  . IR ANGIOGRAM VISCERAL SELECTIVE  02/15/2018  . IR ANGIOGRAM VISCERAL SELECTIVE  02/15/2018  . IR EMBO TUMOR ORGAN ISCHEMIA INFARCT INC GUIDE ROADMAPPING  02/15/2018  . IR RADIOLOGIST EVAL & MGMT  01/26/2018  . IR RADIOLOGIST EVAL & MGMT  03/17/2018  . IR RADIOLOGIST EVAL & MGMT  06/29/2018  . IR RADIOLOGIST EVAL & MGMT  07/27/2018  . IR RADIOLOGIST EVAL & MGMT  10/28/2018  . IR US GUIDE VASC ACCESS RIGHT  02/15/2018  . knee surgery bilat     . PROSTATE SURGERY    . RADIOLOGY WITH ANESTHESIA N/A 11/24/2018   Procedure: MICROWAVE THERMAL ABLATION LIVER;  Surgeon: Sandi Mariscal, MD;  Location: [facility] ORS;  Service: Anesthesiology;  Laterality: N/A;  . right shoulder rotator cuff surgery    . TONSILLECTOMY      Allergies: Lipitor [atorvastatin], Simvastatin, and Zithromax [azithromycin]  Medications: Prior to Admission medications   Medication Sig Start Date End Date Taking? Authorizing Provider  apixaban (ELIQUIS) 5 MG TABS tablet TAKE 1 TABLET(5 MG) BY MOUTH TWICE DAILY 11/15/18   [name], MD  CINNAMON PO Take 1,000 mg by mouth daily.    [provider]  diltiazem (CARDIZEM CD) 240 MG 24 hr capsule Take 1 capsule (240 mg total) by mouth daily. Please keep upcoming appt in January with Dr. Irish Lack for future refills. Thank you 08/02/18   [name], MD  fish oil-omega-3 fatty acids  1000 MG capsule Take 2 g by mouth 2 (two) times daily.     [provider]  furosemide (LASIX) 40 MG tablet TAKE 1 TABLET BY MOUTH TWICE DAILY AS NEEDED Patient taking differently: Take 40 mg by mouth 2 (two) times daily as needed for edema. TAKE 1 TABLET BY MOUTH TWICE DAILY AS NEEDED 06/18/18   [name], MD  ibuprofen (ADVIL,MOTRIN) 200 MG tablet Take 800 mg by mouth every 6 (six) hours as needed (PAIN). prn 06/30/13   [provider]   loratadine (CLARITIN) 10 MG tablet Take 10 mg by mouth daily as needed for allergies.     [provider]  metFORMIN (GLUCOPHAGE) 1000 MG tablet Take 1,000 mg by mouth 2 (two) times daily. 06/27/14   [provider]  metoprolol succinate (TOPROL-XL) 50 MG 24 hr tablet Take 50 mg by mouth daily.  08/10/13   [provider]  Multiple Vitamins-Minerals (MULTIVITAMIN WITH MINERALS) tablet Take 1 tablet by mouth daily.    [provider]  omeprazole (PRILOSEC) 20 MG capsule Take 20 mg by mouth daily.  11/29/12   [provider]  oxyCODONE-acetaminophen (PERCOCET/ROXICET) 5-325 MG tablet Take 1 tablet by mouth every 6 (six) hours as needed for moderate pain or severe pain. 11/26/18   [name], PA  oxymetazoline (AFRIN) 0.05 % nasal spray Place 1 spray into both nostrils 2 (two) times daily as needed for congestion.    [provider]  potassium chloride SA (K-DUR,KLOR-CON) 20 MEQ tablet TAKE 1 TABLET BY MOUTH EVERY DAY AS NEEDED WHILE TAKING FUROSEMIDE 06/18/18   [name], MD  pregabalin (LYRICA) 100 MG capsule Take 100 mg by mouth 2 (two) times a day.  09/14/18   [provider]  ramipril (ALTACE) 10 MG capsule Take 10 mg by mouth 2 (two) times daily. 01/11/13   [provider]  Semaglutide (OZEMPIC) 1 MG/DOSE SOPN Inject 1 mg into the skin once a week. Monday     [provider]  ZETIA 10 MG tablet Take 10 mg by mouth daily.  01/17/13   [provider]     Family History  Problem Relation Age of Onset  . Hypertension Brother   . Diabetes Brother   . Cancer Brother        bladder cancer  . Diabetes Brother   . CVA Brother   . Hypertension Brother   . Prostate cancer Brother   . Diabetes Brother   . Hypertension Brother   . Heart disease Brother        CABG  . Heart attack Brother   . Stroke Brother     Social History   Socioeconomic History  . Marital status: Widowed    Spouse  name: Not on file  . Number of children: Not on file  . Years of education: Not on file  . Highest education level: Not on file  Occupational History  . Not on file  Social Needs  . Financial resource strain: Not on file  . Food insecurity    Worry: Not on file    Inability: Not on file  . Transportation needs    Medical: Not on file    Non-medical: Not on file  Tobacco Use  . Smoking status: Former Smoker    Packs/day: 1.00    Years: 15.00    Pack years: 15.00    Quit date: 05/27/1975    Years since quitting: 43.5  . Smokeless tobacco: Never  Used  Substance and Sexual Activity  . Alcohol use: No  . Drug use: No  . Sexual activity: Not on file  Lifestyle  . Physical activity    Days per week: Not on file    Minutes per session: Not on file  . Stress: Not on file  Relationships  . Social Herbalist on phone: Not on file    Gets together: Not on file    Attends religious service: Not on file    Active member of club or organization: Not on file    Attends meetings of clubs or organizations: Not on file    Relationship status: Not on file  Other Topics Concern  . Not on file  Social History Narrative  . Not on file    ECOG Status: 0 - Asymptomatic  Review of Systems  Review of Systems: A 12 point ROS discussed and pertinent positives are indicated in the HPI above.  All other systems are negative.  Physical Exam No direct physical exam was performed (except for noted visual exam findings with Video Visits).   Vital Signs: There were no vitals taken for this visit.  Imaging: Dg Chest 1 View  Result Date: 11/24/2018 CLINICAL DATA:  History of prostate cancer, hypertension. EXAM: CHEST  1 VIEW COMPARISON:  Radiographs of December 05, 2013. FINDINGS: The heart size and mediastinal contours are within normal limits. No pneumothorax is noted. Minimal left pleural effusion is noted. Mild right basilar atelectasis or scarring is noted. The visualized skeletal  structures are unremarkable. IMPRESSION: Mild right basilar subsegmental atelectasis or scarring is noted. Minimal left pleural effusion is noted. Electronically Signed   By: Marijo Conception M.D.   On: 11/24/2018 09:19   Ct Guide Tissue Ablation  Result Date: 11/24/2018 INDICATION: History of biopsy-proven hepatocellular carcinoma post bland embolization performed 02/15/2018 who presents today for image guided hepatic microwave ablation. EXAM: CT-GUIDED PERCUTANEOUS THERMAL ABLATION OF LIVER LESION ANESTHESIA/SEDATION: General MEDICATIONS: Zosyn 3.375 g IV. The antibiotic was administered in an appropriate time interval prior to needle puncture of the skin. CONTRAST:  A total of 150 cc Omnipaque 300 PROCEDURE: The procedure, risks, benefits, and alternatives were explained to the patient. Questions regarding the procedure were encouraged and answered. The patient understands and consents to the procedure. The patient was placed under general anesthesia. Initial unenhanced CT was performed in a supine, slightly LPO position to localize ill-defined lesion within the subcapsular aspect of the posterior segment of the right lobe liver. Ill-defined lesion was also identified sonographically, though note, sonographic evaluation is degraded secondary to patient body habitus. The procedure was planned. The patient was prepped with Betadine in a sterile fashion, and a sterile drape was applied covering the operative field. A sterile gown and sterile gloves were used for the procedure. A 22 gauge spinal needle was advanced in expected trajectory of the microwave ablation probe for procedural planning. Once the appropriate trajectory was established, intravenous contrast was administered to ensure adequate lesion visualization. Next, two 20 cm length NeuWave PR microwave ablation probes were advanced into the hypoattenuating hepatic lesion under intermittent CT and ultrasound guidance. Once appropriate positioning was  confirmed, both probes were locked in place. Next, an 41 gauge trocar needle was advanced adjacent to the peripheral aspect the right lobe of the liver and hydrodissection (saline and dilute contrast) was administered. Next, a 10-minute ablation was performed at 61 W with intra ablation imaging performed at approximately 6 and 10 minutes. Tract ablation  was performed as the microwave ablation probe was removed. Completion contrast-enhanced arterial and portal venous phase imaging was obtained and the procedure was terminated Ablation access sites were anesthetized with 1% lidocaine with epinephrine. Superficial hemostasis was achieved with manual compression. Dressings were applied. The patient tolerated the procedure well without immediate postprocedural complication. COMPLICATIONS: None immediate. FINDINGS: Preprocedural imaging demonstrates ill-defined bilobed hypoattenuating lesion within the posterior inferior aspect of the right lobe of the liver with dominant cranial component measuring approximately 2.5 x 2.5 cm (image 67, series 2) and caudal component measuring approximately 2.3 x 2.3 cm (image 69). Under CT and ultrasound guidance, microwave ablation probes were placed bracketing the cranial and caudal aspect of the ill-defined hepatic lesion. Post ablation imaging demonstrates an adequate ablation zone encompassing the entirety of the ill-defined infiltrative lesion within the posterior inferior aspect the right lobe of the liver and was negative for obvious complication, specifically, no pneumothorax or significant hemorrhage about the ablation site. IMPRESSION: Technically successful ultrasound and CT guided percutaneous thermal ablation of infiltrative lesion within the posterior inferior aspect of the right lobe of the liver. PLAN: - The patient will be admitted overnight for continued observation PCA usage - The patient was seen in the interventional radiology clinic (either in person or via tele  conference) in 3-4 weeks. - Initial postprocedural imaging will be obtained in 3 months (October 2020). Electronically Signed   By: Sandi Mariscal M.D.   On: 11/24/2018 13:22    Labs:  CBC: Recent Labs    02/15/18 1250 07/02/18 1448 11/19/18 1619 11/25/18 0402  WBC 10.3 12.3* 9.2 15.0*  HGB 15.0 14.6 14.6 13.6  HCT 43.2 42.5 44.1 42.3  PLT 318 377 280 239    COAGS: Recent Labs    12/22/17 0615 12/30/17 1606 02/15/18 1250 11/19/18 1619  INR 0.99 1.08 0.95 1.1  APTT  --   --   --  40*    BMP: Recent Labs    03/11/18 1345 07/02/18 1448 10/25/18 1502 11/19/18 1619 11/25/18 0402  NA 140 142  --  143 142  K 4.1 3.8  --  3.6 4.1  CL 101 103  --  102 103  CO2 25 27  --  27 27  GLUCOSE 174* 122*  --  206* 163*  BUN 12 13  --  17 16  CALCIUM 10.0 9.5  --  9.7 8.8*  CREATININE 0.95 1.08 1.10 1.13 1.18  GFRNONAA 77 >60  --  >60 60*  GFRAA 90 >60  --  >60 >60    LIVER FUNCTION TESTS: Recent Labs    02/15/18 1250 03/11/18 1345 07/02/18 1448 11/19/18 1619 11/25/18 0402  BILITOT 0.7 0.3 0.7 0.8 1.1  AST 37 44* 41 35 119*  ALT 42 42 41 39 94*  ALKPHOS 91  --  109 97 78  PROT 7.2 6.4 6.9 7.4 6.5  ALBUMIN 4.0  --  3.7 4.1 3.9    TUMOR MARKERS: AFP - 2.2 (07/02/2018); 1.2 (12/31/2017)  Assessment and Plan:  Steve F Steve Jensen a 78 y.o.malewith past medical history significant for type 2 diabetes, hypertension, hyperlipidemia, morbid obesity with obstructive sleep apnea and prostate cancer who is seen in consultation via telemedicine following technically successful hepatic microwave ablation performed 11/24/2018 for biopsy-proven hepatocellular carcinoma after undergoing a successful transcatheter bland embolization on 02/15/2018.  The patient has recovered completely from the microwave ablation and is without complaint.  Selected images from ultrasound and CT-guided microwave hepatic ablation performed 11/24/2018 as well  as CT scan of the abdomen and pelvis performed  10/25/2018 were reviewed in detail.  Steve explained that the lesion is difficult to visualize given his body habitus though with the augmentation of administered intravenous contrast, Steve am hopeful ablation was successful as it was performed with curative intent.  Steve explained that we will perform initial postprocedural imaging 3 months following the ablation (October 2020).  While previous MRI performed 06/29/2018 was of limited utility due to patient's inability to adequately perform breath-hold's, previous abdominal MRI performed 01/13/2018 was of diagnostic quality and Steve am hopeful may be replicated in October as abdominal MRI is a better imaging modality to not only evaluate the ablation site but also to screen for new areas of hepatocellular carcinoma given his history of NASH cirrhosis.  Plan:  - Initial postprocedural abdominal MRI 3 months following the ablation (October 2020) - Per pt request, please schedule patient in open bore MRI d/t his body habitus and shoulder problems.  - While the patient's AFP has never been elevated (2.2 on 07/02/2018 and 1.2 on 12/31/2017), we will obtain a repeat AFP level as part of his surveillance.  Patient knows to call the interventional radiology clinic with any interval questions or concerns.  Thank you for this interesting consult.  Steve Jensen and look forward to participating in their care.  A copy of this report was sent to the requesting provider on this date.  Electronically Signed: Sandi Mariscal 12/14/2018, 3:56 PM   Steve spent a total of 10 Minutes in remote  clinical consultation, greater than 50% of which was counseling/coordinating care for post microwave ablation.    Visit type: Audio only (telephone). Audio (no video) only due to patient's lack of internet/smartphone capability. Alternative for in-person consultation at [facility], [address], [place], [place]. This visit type was conducted due to national  recommendations for restrictions regarding the COVID-19 Pandemic (e.g. social distancing).  This format is felt to be most appropriate for this patient at this time.  All issues noted in this document were discussed and addressed.

## 2018-12-15 ENCOUNTER — Encounter: Payer: Self-pay | Admitting: *Deleted

## 2019-02-22 ENCOUNTER — Other Ambulatory Visit: Payer: Self-pay | Admitting: Interventional Radiology

## 2019-02-22 DIAGNOSIS — C220 Liver cell carcinoma: Secondary | ICD-10-CM

## 2019-03-02 ENCOUNTER — Other Ambulatory Visit: Payer: Self-pay

## 2019-03-02 DIAGNOSIS — C220 Liver cell carcinoma: Secondary | ICD-10-CM

## 2019-03-16 ENCOUNTER — Other Ambulatory Visit: Payer: Self-pay

## 2019-03-16 ENCOUNTER — Ambulatory Visit (HOSPITAL_COMMUNITY)
Admission: RE | Admit: 2019-03-16 | Discharge: 2019-03-16 | Disposition: A | Discharge status: Home / Self Care | Payer: Medicare Other | Source: Ambulatory Visit | Attending: Interventional Radiology | Admitting: Interventional Radiology

## 2019-03-16 DIAGNOSIS — C220 Liver cell carcinoma: Secondary | ICD-10-CM | POA: Diagnosis present

## 2019-03-16 LAB — COMPLETE METABOLIC PANEL WITH GFR
AG Ratio: 1.9 (calc) (ref 1.0–2.5)
ALT: 26 U/L (ref 9–46)
AST: 24 U/L (ref 10–35)
Albumin: 4.2 g/dL (ref 3.6–5.1)
Alkaline phosphatase (APISO): 109 U/L (ref 35–144)
BUN: 15 mg/dL (ref 7–25)
CO2: 30 mmol/L (ref 20–32)
Calcium: 9.6 mg/dL (ref 8.6–10.3)
Chloride: 104 mmol/L (ref 98–110)
Creat: 1.17 mg/dL (ref 0.70–1.18)
GFR, Est African American: 69 mL/min/{1.73_m2} (ref >=60)
GFR, Est Non African American: 60 mL/min/{1.73_m2} (ref >=60)
Globulin: 2.2 g/dL (calc) (ref 1.9–3.7)
Glucose, Bld: 124 mg/dL — ABNORMAL HIGH (ref 65–99)
Potassium: 4 mmol/L (ref 3.5–5.3)
Sodium: 144 mmol/L (ref 135–146)
Total Bilirubin: 0.4 mg/dL (ref 0.2–1.2)
Total Protein: 6.4 g/dL (ref 6.1–8.1)

## 2019-03-16 LAB — AFP TUMOR MARKER: AFP-Tumor Marker: 1.8 ng/mL (ref <6.1)

## 2019-03-16 MED ORDER — GADOBUTROL 1 MMOL/ML IV SOLN
10.0000 mL | Freq: Once | INTRAVENOUS | Status: AC | PRN
  Administered 2019-03-16: 18:00:00 10 mL via INTRAVENOUS

## 2019-03-17 ENCOUNTER — Ambulatory Visit
Admission: RE | Admit: 2019-03-17 | Discharge: 2019-03-17 | Disposition: A | Discharge status: Home / Self Care | Payer: Medicare Other | Source: Ambulatory Visit | Attending: Interventional Radiology | Admitting: Interventional Radiology

## 2019-03-17 ENCOUNTER — Encounter: Payer: Self-pay | Admitting: *Deleted

## 2019-03-17 DIAGNOSIS — C220 Liver cell carcinoma: Secondary | ICD-10-CM

## 2019-03-17 HISTORY — PX: IR RADIOLOGIST EVAL & MGMT: IMG5224

## 2019-03-17 NOTE — Progress Notes (Signed)
Patient ID: Steve Jensen, male   DOB: [birth year], 78 y.o.   MRN: [id number]         Chief Complaint: Post hepatic microwave ablation (11/24/2018)  Referring Physician(s): Burr Medico (Oncology)  History of Present Illness:  Steve Jensen is a 78 y.o. male with past medical history significant for type 2 diabetes, hypertension, hyperlipidemia, morbid obesity obstructive sleep apnea and prostate cancer who is seen in consultation via telemedicine following the acquisition of a postprocedural abdominal MRI performed yesterday, 03/16/2019, following technically successful hepatic microwave ablation performed 11/24/2018 and transcatheter bland embolization performed 02/15/2018 for biopsy-proven hepatocellular carcinoma.  Patient is currently without complaint.  Specifically, no abdominal pain, fever or chills.  No increased abdominal girth.  No yellowing of the skin or eyes.  No change in appetite or energy level.   Past Medical History:  Diagnosis Date  . Anemia   . Diabetes mellitus type II, controlled ([name])    with neuropathy  . Dysrhythmia    A-Fib. cardioversion done  . GERD (gastroesophageal reflux disease)   . Hyperlipidemia   . Hypertension   . Morbid obesity ([name])   . Neuromuscular disorder ([name])    feet  . OSA (obstructive sleep apnea)    On BiPAP at 15/11cm H2O  . Prostate cancer ([name])   . Shingles    on face Nov 2010  . Urinary incontinence     Past Surgical History:  Procedure Laterality Date  . CARDIOVERSION N/A 09/06/2013   Procedure: CARDIOVERSION;  Surgeon: [name], MD;  Location: [facility] ENDOSCOPY;  Service: Cardiovascular;  Laterality: N/A;  . EYE SURGERY     cataract surgery bilat; surgery to correct droopy eyelids   . IR ANGIOGRAM SELECTIVE EACH ADDITIONAL VESSEL  02/15/2018  . IR ANGIOGRAM SELECTIVE EACH ADDITIONAL VESSEL  02/15/2018  . IR ANGIOGRAM SELECTIVE EACH ADDITIONAL VESSEL  02/15/2018  . IR ANGIOGRAM SELECTIVE EACH ADDITIONAL VESSEL  02/15/2018  . IR  ANGIOGRAM SELECTIVE EACH ADDITIONAL VESSEL  02/15/2018  . IR ANGIOGRAM VISCERAL SELECTIVE  02/15/2018  . IR ANGIOGRAM VISCERAL SELECTIVE  02/15/2018  . IR EMBO TUMOR ORGAN ISCHEMIA INFARCT INC GUIDE ROADMAPPING  02/15/2018  . IR RADIOLOGIST EVAL & MGMT  01/26/2018  . IR RADIOLOGIST EVAL & MGMT  03/17/2018  . IR RADIOLOGIST EVAL & MGMT  06/29/2018  . IR RADIOLOGIST EVAL & MGMT  07/27/2018  . IR RADIOLOGIST EVAL & MGMT  10/28/2018  . IR RADIOLOGIST EVAL & MGMT  12/14/2018  . IR US GUIDE VASC ACCESS RIGHT  02/15/2018  . knee surgery bilat     . PROSTATE SURGERY    . RADIOLOGY WITH ANESTHESIA N/A 11/24/2018   Procedure: MICROWAVE THERMAL ABLATION LIVER;  Surgeon: Sandi Mariscal, MD;  Location: [facility] ORS;  Service: Anesthesiology;  Laterality: N/A;  . right shoulder rotator cuff surgery    . TONSILLECTOMY      Allergies: Lipitor [atorvastatin], Simvastatin, and Zithromax [azithromycin]  Medications: Prior to Admission medications   Medication Sig Start Date End Date Taking? Authorizing Provider  apixaban (ELIQUIS) 5 MG TABS tablet TAKE 1 TABLET(5 MG) BY MOUTH TWICE DAILY 11/15/18   [name], MD  CINNAMON PO Take 1,000 mg by mouth daily.    [provider]  diltiazem (CARDIZEM CD) 240 MG 24 hr capsule Take 1 capsule (240 mg total) by mouth daily. Please keep upcoming appt in January with Dr. Irish Lack for future refills. Thank you 08/02/18   [name], MD  fish oil-omega-3 fatty acids  1000 MG capsule Take 2 g by mouth 2 (two) times daily.     [provider]  furosemide (LASIX) 40 MG tablet TAKE 1 TABLET BY MOUTH TWICE DAILY AS NEEDED Patient taking differently: Take 40 mg by mouth 2 (two) times daily as needed for edema. TAKE 1 TABLET BY MOUTH TWICE DAILY AS NEEDED 06/18/18   [name], MD  ibuprofen (ADVIL,MOTRIN) 200 MG tablet Take 800 mg by mouth every 6 (six) hours as needed (PAIN). prn 06/30/13   [provider]  loratadine (CLARITIN) 10 MG tablet Take 10 mg  by mouth daily as needed for allergies.     [provider]  metFORMIN (GLUCOPHAGE) 1000 MG tablet Take 1,000 mg by mouth 2 (two) times daily. 06/27/14   [provider]  metoprolol succinate (TOPROL-XL) 50 MG 24 hr tablet Take 50 mg by mouth daily.  08/10/13   [provider]  Multiple Vitamins-Minerals (MULTIVITAMIN WITH MINERALS) tablet Take 1 tablet by mouth daily.    [provider]  omeprazole (PRILOSEC) 20 MG capsule Take 20 mg by mouth daily.  11/29/12   [provider]  oxyCODONE-acetaminophen (PERCOCET/ROXICET) 5-325 MG tablet Take 1 tablet by mouth every 6 (six) hours as needed for moderate pain or severe pain. 11/26/18   [name], PA  oxymetazoline (AFRIN) 0.05 % nasal spray Place 1 spray into both nostrils 2 (two) times daily as needed for congestion.    [provider]  potassium chloride SA (K-DUR,KLOR-CON) 20 MEQ tablet TAKE 1 TABLET BY MOUTH EVERY DAY AS NEEDED WHILE TAKING FUROSEMIDE 06/18/18   [name], MD  pregabalin (LYRICA) 100 MG capsule Take 100 mg by mouth 2 (two) times a day.  09/14/18   [provider]  ramipril (ALTACE) 10 MG capsule Take 10 mg by mouth 2 (two) times daily. 01/11/13   [provider]  Semaglutide (OZEMPIC) 1 MG/DOSE SOPN Inject 1 mg into the skin once a week. Monday     [provider]  ZETIA 10 MG tablet Take 10 mg by mouth daily.  01/17/13   [provider]     Family History  Problem Relation Age of Onset  . Hypertension Brother   . Diabetes Brother   . Cancer Brother        bladder cancer  . Diabetes Brother   . CVA Brother   . Hypertension Brother   . Prostate cancer Brother   . Diabetes Brother   . Hypertension Brother   . Heart disease Brother        CABG  . Heart attack Brother   . Stroke Brother     Social History   Socioeconomic History  . Marital status: Widowed    Spouse name: Not on file  . Number of children: Not on  file  . Years of education: Not on file  . Highest education level: Not on file  Occupational History  . Not on file  Social Needs  . Financial resource strain: Not on file  . Food insecurity    Worry: Not on file    Inability: Not on file  . Transportation needs    Medical: Not on file    Non-medical: Not on file  Tobacco Use  . Smoking status: Former Smoker    Packs/day: 1.00    Years: 15.00    Pack years: 15.00    Quit date: 05/27/1975    Years since quitting: 43.8  . Smokeless tobacco: Never  Used  Substance and Sexual Activity  . Alcohol use: No  . Drug use: No  . Sexual activity: Not on file  Lifestyle  . Physical activity    Days per week: Not on file    Minutes per session: Not on file  . Stress: Not on file  Relationships  . Social Herbalist on phone: Not on file    Gets together: Not on file    Attends religious service: Not on file    Active member of club or organization: Not on file    Attends meetings of clubs or organizations: Not on file    Relationship status: Not on file  Other Topics Concern  . Not on file  Social History Narrative  . Not on file    ECOG Status: 0 - Asymptomatic  Review of Systems  Review of Systems: A 12 point ROS discussed and pertinent positives are indicated in the HPI above.  All other systems are negative.  Physical Exam No direct physical exam was performed (except for noted visual exam findings with Video Visits).   Vital Signs: There were no vitals taken for this visit.  Imaging: Mr Abdomen Wwo Contrast  Result Date: 03/16/2019 CLINICAL DATA:  Follow-up hepatocellular carcinoma. Approximately 4 months status post percutaneous ablation. EXAM: MRI ABDOMEN WITHOUT AND WITH CONTRAST TECHNIQUE: Multiplanar multisequence MR imaging of the abdomen was performed both before and after the administration of intravenous contrast. CONTRAST:  61mL GADAVIST GADOBUTROL 1 MMOL/ML IV SOLN COMPARISON:  CT on 10/25/2018  FINDINGS: Lower chest: No acute findings. Hepatobiliary: Ablation defect is again seen in the posterior right hepatic lobe. This measures 4.8 x 3.5 cm, and shows a thin peripheral rim of enhancement. No nodular or masslike contrast enhancement is seen associated with this ablation defect. No other liver masses are identified. Mild hepatic steatosis again noted. Gallbladder is unremarkable. No evidence of biliary ductal dilatation. Pancreas:  No mass or inflammatory changes. Spleen:  Within normal limits in size and appearance. Adrenals/Urinary Tract: Stable 3.4 cm right adrenal mass, consistent with benign adenoma. A few small renal cysts are again noted, however no renal masses are identified. No evidence of hydronephrosis. Stomach/Bowel: Small hiatal hernia again seen. Otherwise unremarkable. Vascular/Lymphatic: No pathologically enlarged lymph nodes identified. No abdominal aortic aneurysm. Other:  None. Musculoskeletal:  No suspicious bone lesions identified. IMPRESSION: Stable ablation defect in the posterior right hepatic lobe. No evidence of locally recurrent tumor, or other hepatic masses. No evidence of abdominal metastatic disease. Stable benign right adrenal adenoma and small hiatal hernia. Electronically Signed   By: Marlaine Hind M.D.   On: 03/16/2019 20:11    Labs:  CBC: Recent Labs    07/02/18 1448 11/19/18 1619 11/25/18 0402  WBC 12.3* 9.2 15.0*  HGB 14.6 14.6 13.6  HCT 42.5 44.1 42.3  PLT 377 280 239    COAGS: Recent Labs    11/19/18 1619  INR 1.1  APTT 40*    BMP: Recent Labs    07/02/18 1448 10/25/18 1502 11/19/18 1619 11/25/18 0402 03/15/19 1219  NA 142  --  143 142 144  K 3.8  --  3.6 4.1 4.0  CL 103  --  102 103 104  CO2 27  --  27 27 30   GLUCOSE 122*  --  206* 163* 124*  BUN 13  --  17 16 15   CALCIUM 9.5  --  9.7 8.8* 9.6  CREATININE 1.08 1.10 1.13 1.18 1.17  GFRNONAA >60  --  >  60 60* 60  GFRAA >60  --  >60 >60 69    LIVER FUNCTION TESTS: Recent  Labs    07/02/18 1448 11/19/18 1619 11/25/18 0402 03/15/19 1219  BILITOT 0.7 0.8 1.1 0.4  AST 41 35 119* 24  ALT 41 39 94* 26  ALKPHOS 109 97 78  --   PROT 6.9 7.4 6.5 6.4  ALBUMIN 3.7 4.1 3.9  --     TUMOR MARKERS: Recent Labs    03/15/19 1219  AFPTM 1.8    Assessment and Plan:  Dub VOYD MEHNERT is a 77 y.o. male with past medical history significant for type 2 diabetes, hypertension, hyperlipidemia, morbid obesity obstructive sleep apnea and prostate cancer who is seen in consultation via telemedicine following the acquisition of a postprocedural abdominal MRI performed yesterday, 03/16/2019, following technically successful hepatic microwave ablation performed 11/24/2018 and transcatheter bland embolization performed 02/15/2018 for biopsy-proven hepatocellular carcinoma.  Patient is currently without complaint with normalization of LFTs performed 03/15/2019.  I am pleased to report that postprocedural contrast-enhanced abdominal MRI performed 03/16/2019 is negative for evidence of residual or locally recurrent disease.  This finding is supported by persistent normalization of the patient's AFP level (measuring 1.8 on 03/15/2019).  Also importantly, there is NO evidence of new hepatic lesions to suggest development of multifocal hepatocellular carcinoma.  Plan: - We will proceed with obtaining next surveillance abdominal MRI in 3 months (late January/early February 2021) following acquisition of additional AFP level.    Note, the patient requests to have any subsequent abdominal MRIs performed at [facility] given his excellent experience yesterday which was relayed to the MRI staff at that facility.  The Patient knows to call the interventional radiology clinic with any interval questions or concerns.  Thank you for this interesting consult.  I greatly enjoyed meeting Rayne F Thigpen and look forward to participating in their care.  A copy of this report was sent to the  requesting provider on this date.  Electronically Signed: Sandi Mariscal 03/17/2019, 1:28 PM   I spent a total of 10 Minutes in remote  clinical consultation, greater than 50% of which was counseling/coordinating care for post hepatic microwave ablation.    Visit type: Audio only (telephone). Audio (no video) only due to patient's lack of internet/smartphone capability. Alternative for in-person consultation at [facility], [address] [place], [place], [place]. This visit type was conducted due to national recommendations for restrictions regarding the COVID-19 Pandemic (e.g. social distancing).  This format is felt to be most appropriate for this patient at this time.  All issues noted in this document were discussed and addressed.

## 2019-04-20 ENCOUNTER — Other Ambulatory Visit: Payer: Self-pay | Admitting: Interventional Cardiology

## 2019-04-20 MED ORDER — POTASSIUM CHLORIDE CRYS ER 20 MEQ PO TBCR: EXTENDED_RELEASE_TABLET | ORAL | 0 refills | Status: DC

## 2019-04-20 MED ORDER — DILTIAZEM HCL ER COATED BEADS 240 MG PO CP24: 240.0000 mg | ORAL_CAPSULE | Freq: Every day | ORAL | 0 refills | Status: DC

## 2019-05-23 ENCOUNTER — Other Ambulatory Visit: Payer: Self-pay

## 2019-05-23 MED ORDER — DILTIAZEM HCL ER COATED BEADS 240 MG PO CP24: 240.0000 mg | ORAL_CAPSULE | Freq: Every day | ORAL | 0 refills | Status: DC
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
## 2019-06-17 ENCOUNTER — Encounter: Payer: Self-pay | Admitting: Interventional Cardiology

## 2019-06-17 ENCOUNTER — Ambulatory Visit: Payer: Medicare Other | Admitting: Interventional Cardiology

## 2019-06-17 ENCOUNTER — Other Ambulatory Visit: Payer: Self-pay

## 2019-06-17 VITALS — BP 128/70 | HR 74 | Ht 71.0 in | Wt 306.8 lb

## 2019-06-17 DIAGNOSIS — I480 Paroxysmal atrial fibrillation: Secondary | ICD-10-CM

## 2019-06-17 DIAGNOSIS — G4733 Obstructive sleep apnea (adult) (pediatric): Secondary | ICD-10-CM | POA: Diagnosis not present

## 2019-06-17 DIAGNOSIS — I5032 Chronic diastolic (congestive) heart failure: Secondary | ICD-10-CM | POA: Diagnosis not present

## 2019-06-17 DIAGNOSIS — I110 Hypertensive heart disease with heart failure: Secondary | ICD-10-CM

## 2019-06-17 DIAGNOSIS — I10 Essential (primary) hypertension: Secondary | ICD-10-CM

## 2019-06-17 NOTE — Patient Instructions (Addendum)
Medication Instructions:  Your physician recommends that you continue on your current medications as directed. Please refer to the Current Medication list given to you today.  If you need a refill on your cardiac medications before your next appointment, please call your pharmacy.   Lab work: Your physician recommends that you return for lab work (CMET, CBC)   If you have labs (blood work) drawn today and your tests are completely normal, you will receive your results only by: Marland Kitchen MyChart Message (if you have MyChart) OR . A paper copy in the mail If you have any lab test that is abnormal or we need to change your treatment, we will call you to review the results.  Testing/Procedures: None ordered  Follow-Up: At [facility], you and your health needs are our priority.  As part of our continuing mission to provide you with exceptional heart care, we have created designated Provider Care Teams.  These Care Teams include your primary Cardiologist (physician) and Advanced Practice Providers (APPs -  Physician Assistants and Nurse Practitioners) who all work together to provide you with the care you need, when you need it. . You will need a follow up appointment in 1 year.  Please call our office 2 months in advance to schedule this appointment.  You may see Casandra Doffing, MD or one of the following Advanced Practice Providers on your designated Care Team:   . Lyda Jester, PA-C . Dayna Dunn, PA-C . Ermalinda Barrios, PA-C  Any Other Special Instructions Will Be Listed Below (If Applicable).

## 2019-06-28 ENCOUNTER — Other Ambulatory Visit: Payer: Self-pay | Admitting: Interventional Radiology

## 2019-06-28 DIAGNOSIS — C220 Liver cell carcinoma: Secondary | ICD-10-CM

## 2019-06-29 ENCOUNTER — Other Ambulatory Visit: Payer: Self-pay | Admitting: *Deleted

## 2019-06-29 ENCOUNTER — Telehealth: Payer: Self-pay

## 2019-06-29 DIAGNOSIS — C220 Liver cell carcinoma: Secondary | ICD-10-CM

## 2019-06-29 NOTE — Telephone Encounter (Signed)
I called and let Steve Jensen know that his appointments for 2/5 have been cancelled per Dr Burr Medico

## 2019-07-01 ENCOUNTER — Inpatient Hospital Stay: Payer: Medicare Other

## 2019-07-01 ENCOUNTER — Inpatient Hospital Stay: Payer: Medicare Other | Admitting: Hematology

## 2019-07-06 LAB — CBC
HCT: 42.4 % (ref 38.5–50.0)
Hemoglobin: 14.6 g/dL (ref 13.2–17.1)
MCH: 28.2 pg (ref 27.0–33.0)
MCHC: 34.4 g/dL (ref 32.0–36.0)
MCV: 82 fL (ref 80.0–100.0)
MPV: 11 fL (ref 7.5–12.5)
Platelets: 348 10*3/uL (ref 140–400)
RBC: 5.17 10*6/uL (ref 4.20–5.80)
RDW: 13.6 % (ref 11.0–15.0)
WBC: 8.8 10*3/uL (ref 3.8–10.8)

## 2019-07-06 LAB — PROTIME-INR
INR: 1.1
Prothrombin Time: 11 s (ref 9.0–11.5)

## 2019-07-06 LAB — COMPLETE METABOLIC PANEL WITH GFR
AG Ratio: 2 (calc) (ref 1.0–2.5)
ALT: 28 U/L (ref 9–46)
AST: 26 U/L (ref 10–35)
Albumin: 4.5 g/dL (ref 3.6–5.1)
Alkaline phosphatase (APISO): 106 U/L (ref 35–144)
BUN/Creatinine Ratio: 14 (calc) (ref 6–22)
BUN: 17 mg/dL (ref 7–25)
CO2: 27 mmol/L (ref 20–32)
Calcium: 9.7 mg/dL (ref 8.6–10.3)
Chloride: 102 mmol/L (ref 98–110)
Creat: 1.19 mg/dL — ABNORMAL HIGH (ref 0.70–1.18)
GFR, Est African American: 68 mL/min/{1.73_m2} (ref >=60)
GFR, Est Non African American: 59 mL/min/{1.73_m2} — ABNORMAL LOW (ref >=60)
Globulin: 2.2 g/dL (calc) (ref 1.9–3.7)
Glucose, Bld: 154 mg/dL — ABNORMAL HIGH (ref 65–99)
Potassium: 3.8 mmol/L (ref 3.5–5.3)
Sodium: 143 mmol/L (ref 135–146)
Total Bilirubin: 0.6 mg/dL (ref 0.2–1.2)
Total Protein: 6.7 g/dL (ref 6.1–8.1)

## 2019-07-06 LAB — AFP TUMOR MARKER: AFP-Tumor Marker: 1.5 ng/mL (ref <6.1)

## 2019-07-06 LAB — CEA: CEA: 2.8 ng/mL — ABNORMAL HIGH

## 2019-07-10 ENCOUNTER — Other Ambulatory Visit: Payer: Self-pay

## 2019-07-10 ENCOUNTER — Ambulatory Visit (HOSPITAL_COMMUNITY)
Admission: RE | Admit: 2019-07-10 | Discharge: 2019-07-10 | Disposition: A | Discharge status: Home / Self Care | Payer: Medicare Other | Source: Ambulatory Visit | Attending: Interventional Radiology | Admitting: Interventional Radiology

## 2019-07-10 DIAGNOSIS — C220 Liver cell carcinoma: Secondary | ICD-10-CM | POA: Diagnosis not present

## 2019-07-10 MED ORDER — GADOBUTROL 1 MMOL/ML IV SOLN
10.0000 mL | Freq: Once | INTRAVENOUS | Status: AC | PRN
  Administered 2019-07-10: 17:00:00 10 mL via INTRAVENOUS

## 2019-07-13 ENCOUNTER — Other Ambulatory Visit: Payer: Self-pay | Admitting: Interventional Cardiology

## 2019-07-14 ENCOUNTER — Other Ambulatory Visit: Payer: Self-pay

## 2019-07-14 ENCOUNTER — Ambulatory Visit
Admission: RE | Admit: 2019-07-14 | Discharge: 2019-07-14 | Disposition: A | Discharge status: Home / Self Care | Payer: Medicare Other | Source: Ambulatory Visit | Attending: Interventional Radiology | Admitting: Interventional Radiology

## 2019-07-14 ENCOUNTER — Encounter: Payer: Self-pay | Admitting: *Deleted

## 2019-07-14 DIAGNOSIS — C220 Liver cell carcinoma: Secondary | ICD-10-CM

## 2019-07-14 HISTORY — PX: IR RADIOLOGIST EVAL & MGMT: IMG5224

## 2019-07-14 NOTE — Progress Notes (Signed)
Patient ID: Steve Jensen, male   DOB: 1940/11/06, 79 y.o.   MRN: [id number]         Chief Complaint: Post hepatic microwave ablation (11/24/2018  Referring Physician(s): Burr Medico  History of Present Illness: Steve Jensen is a 79 y.o. male with past medical history significant for type 2 diabetes, hypertension, hyperlipidemia, morbid obesity obstructive sleep apnea and prostate cancer who is seen in consultation via telemedicine following the acquisition of a postprocedural abdominal MRI performed 07/10/2019, following technically successful hepatic microwave ablation performed 11/24/2018 and transcatheter bland embolization performed 02/15/2018 for biopsy-proven hepatocellular carcinoma.  Patient is currently without complaint.  Specifically, no abdominal pain, fever or chills.  No increased abdominal girth.  No yellowing of the skin or eyes.  No change in appetite or energy level.  Past Medical History:  Diagnosis Date  . Anemia   . Diabetes mellitus type II, controlled ([place])    with neuropathy  . Dysrhythmia    A-Fib. cardioversion done  . GERD (gastroesophageal reflux disease)   . Hyperlipidemia   . Hypertension   . Morbid obesity ([place])   . Neuromuscular disorder ([place])    feet  . OSA (obstructive sleep apnea)    On BiPAP at 15/11cm H2O  . Prostate cancer ([place])   . Shingles    on face Nov 2010  . Urinary incontinence     Past Surgical History:  Procedure Laterality Date  . CARDIOVERSION N/A 09/06/2013   Procedure: CARDIOVERSION;  Surgeon: [name], MD;  Location: [facility] ENDOSCOPY;  Service: Cardiovascular;  Laterality: N/A;  . EYE SURGERY     cataract surgery bilat; surgery to correct droopy eyelids   . IR ANGIOGRAM SELECTIVE EACH ADDITIONAL VESSEL  02/15/2018  . IR ANGIOGRAM SELECTIVE EACH ADDITIONAL VESSEL  02/15/2018  . IR ANGIOGRAM SELECTIVE EACH ADDITIONAL VESSEL  02/15/2018  . IR ANGIOGRAM SELECTIVE EACH ADDITIONAL VESSEL  02/15/2018  . IR ANGIOGRAM SELECTIVE EACH  ADDITIONAL VESSEL  02/15/2018  . IR ANGIOGRAM VISCERAL SELECTIVE  02/15/2018  . IR ANGIOGRAM VISCERAL SELECTIVE  02/15/2018  . IR EMBO TUMOR ORGAN ISCHEMIA INFARCT INC GUIDE ROADMAPPING  02/15/2018  . IR RADIOLOGIST EVAL & MGMT  01/26/2018  . IR RADIOLOGIST EVAL & MGMT  03/17/2018  . IR RADIOLOGIST EVAL & MGMT  06/29/2018  . IR RADIOLOGIST EVAL & MGMT  07/27/2018  . IR RADIOLOGIST EVAL & MGMT  10/28/2018  . IR RADIOLOGIST EVAL & MGMT  12/14/2018  . IR RADIOLOGIST EVAL & MGMT  03/17/2019  . IR US GUIDE VASC ACCESS RIGHT  02/15/2018  . knee surgery bilat     . PROSTATE SURGERY    . RADIOLOGY WITH ANESTHESIA N/A 11/24/2018   Procedure: MICROWAVE THERMAL ABLATION LIVER;  Surgeon: Sandi Mariscal, MD;  Location: [facility] ORS;  Service: Anesthesiology;  Laterality: N/A;  . right shoulder rotator cuff surgery    . TONSILLECTOMY      Allergies: Lipitor [atorvastatin], Simvastatin, and Zithromax [azithromycin]  Medications: Prior to Admission medications   Medication Sig Start Date End Date Taking? Authorizing Provider  apixaban (ELIQUIS) 5 MG TABS tablet TAKE 1 TABLET(5 MG) BY MOUTH TWICE DAILY 11/15/18   [name], MD  CINNAMON PO Take 1,000 mg by mouth daily.    [provider]  diltiazem (CARDIZEM CD) 240 MG 24 hr capsule Take 1 capsule (240 mg total) by mouth daily. 07/14/19   [name], MD  fish oil-omega-3 fatty acids 1000 MG capsule Take 2 g by mouth 2 (  two) times daily.     [provider]  furosemide (LASIX) 40 MG tablet TAKE 1 TABLET BY MOUTH TWICE DAILY AS NEEDED 06/18/18   [name], MD  ibuprofen (ADVIL,MOTRIN) 200 MG tablet Take 800 mg by mouth every 6 (six) hours as needed (PAIN). prn 06/30/13   [provider]  loratadine (CLARITIN) 10 MG tablet Take 10 mg by mouth daily as needed for allergies.     [provider]  metFORMIN (GLUCOPHAGE) 1000 MG tablet Take 1,000 mg by mouth 2 (two) times daily. 06/27/14   [provider]  metoprolol  succinate (TOPROL-XL) 50 MG 24 hr tablet Take 50 mg by mouth daily.  08/10/13   [provider]  Multiple Vitamins-Minerals (MULTIVITAMIN WITH MINERALS) tablet Take 1 tablet by mouth daily.    [provider]  omeprazole (PRILOSEC) 20 MG capsule Take 20 mg by mouth daily.  11/29/12   [provider]  oxymetazoline (AFRIN) 0.05 % nasal spray Place 1 spray into both nostrils 2 (two) times daily as needed for congestion.    [provider]  potassium chloride SA (KLOR-CON) 20 MEQ tablet TAKE 1 TABLET BY MOUTH EVERY DAY AS NEEDED WHILE TAKING FUROSEMIDE. Please make yearly appt with Dr. Irish Lack for January before anymore refills. 1st attempt 04/20/19   [name], MD  pregabalin (LYRICA) 100 MG capsule Take 100 mg by mouth 2 (two) times a day.  09/14/18   [provider]  ramipril (ALTACE) 10 MG capsule Take 10 mg by mouth 2 (two) times daily. 01/11/13   [provider]  Semaglutide (OZEMPIC) 1 MG/DOSE SOPN Inject 1 mg into the skin once a week. Monday     [provider]  ZETIA 10 MG tablet Take 10 mg by mouth daily.  01/17/13   [provider]     Family History  Problem Relation Age of Onset  . Hypertension Brother   . Diabetes Brother   . Cancer Brother        bladder cancer  . Diabetes Brother   . CVA Brother   . Hypertension Brother   . Prostate cancer Brother   . Diabetes Brother   . Hypertension Brother   . Heart disease Brother        CABG  . Heart attack Brother   . Stroke Brother     Social History   Socioeconomic History  . Marital status: Widowed    Spouse name: Not on file  . Number of children: Not on file  . Years of education: Not on file  . Highest education level: Not on file  Occupational History  . Not on file  Tobacco Use  . Smoking status: Former Smoker    Packs/day: 1.00    Years: 15.00    Pack years: 15.00    Quit date: 05/27/1975    Years since quitting: 44.1  . Smokeless  tobacco: Never Used  Substance and Sexual Activity  . Alcohol use: No  . Drug use: No  . Sexual activity: Not on file  Other Topics Concern  . Not on file  Social History Narrative  . Not on file   Social Determinants of Health   Financial Resource Strain:   . Difficulty of Paying Living Expenses: Not on file  Food Insecurity:   . Worried About Charity fundraiser in the Last Year: Not on file  . Ran Out of Food in the Last Year: Not on file  Transportation  Needs:   . Lack of Transportation (Medical): Not on file  . Lack of Transportation (Non-Medical): Not on file  Physical Activity:   . Days of Exercise per Week: Not on file  . Minutes of Exercise per Session: Not on file  Stress:   . Feeling of Stress : Not on file  Social Connections:   . Frequency of Communication with Friends and Family: Not on file  . Frequency of Social Gatherings with Friends and Family: Not on file  . Attends Religious Services: Not on file  . Active Member of Clubs or Organizations: Not on file  . Attends Archivist Meetings: Not on file  . Marital Status: Not on file    ECOG Status: 1 - Symptomatic but completely ambulatory  Review of Systems  Review of Systems: A 12 point ROS discussed and pertinent positives are indicated in the HPI above.  All other systems are negative.  Physical Exam No direct physical exam was performed (except for noted visual exam findings with Video Visits).   Vital Signs: There were no vitals taken for this visit.  Imaging:  Personal review of abdominal MRI performed 07/10/2019 is negative for definitive evidence of residual/recurrent disease at the ablation site.  Imaging findings supported by persistent not elevation of the patient's AFP.  Note is made of an approximately 1.1 cm is seen on image 51, series 901, is not definitively seen on other sequences nor seen on previous examinations and while potentially artifactual warrants continued attention  on follow-up.  MR ABDOMEN WWO CONTRAST  Result Date: 07/10/2019 CLINICAL DATA:  Hepatocellular carcinoma follow up. Prior percutaneous ablation. EXAM: MRI ABDOMEN WITHOUT AND WITH CONTRAST TECHNIQUE: Multiplanar multisequence MR imaging of the abdomen was performed both before and after the administration of intravenous contrast. CONTRAST:  45mL GADAVIST GADOBUTROL 1 MMOL/ML IV SOLN COMPARISON:  Multiple exams, including 03/16/2019 FINDINGS: Despite efforts by the technologist and patient, motion artifact is present on today's exam and could not be eliminated. This reduces exam sensitivity and specificity. Lower chest: Suspected airway plugging in the right lower lobe. Small hiatal hernia. Hepatobiliary: Diffuse hepatic steatosis. The ablation site posteriorly in the right hepatic lobe appears similar to prior, with oval-shaped nonenhancing ablation site measuring 4.8 by 3.1 cm. On arterial phase images there is some faint surrounding indistinctly marginated non masslike enhancement similar to the 03/16/2019 exam. This does not have the nodular appearance that I would expect from recurrence, and probably reflects some mild periablation fibrosis or local vascular alteration. Questionable 1.1 cm focus of arterial phase enhancement in segment 2 of the liver on image 51/901, not readily visible on other phases, but without an appreciable washout or capsule appearance. LI-RADS category LR-3. Pancreas:  Unremarkable Spleen:  Unremarkable Adrenals/Urinary Tract: Stable right adrenal adenoma. The kidneys demonstrate no significant abnormality. Stomach/Bowel: Sigmoid colon diverticulosis. Vascular/Lymphatic: Infrarenal abdominal aortic ectasia, 2.6 cm in anterior-posterior dimension on image 127/903. Aortoiliac atherosclerotic vascular disease. Other:  No supplemental non-categorized findings. Musculoskeletal: Two adjacent left lower rib deformities with some associated enhancement shown on images 64-84 of series 903,  suspicious for subacute rib fractures. IMPRESSION: 1. Stable appearance of the ablation site posteriorly in the right hepatic lobe, without compelling findings of local recurrence. There is some hazy marginal arterial phase enhancement around the ablation site, not changed from prior, an without a nodular appearance, this is probably simply reactive vascular changes to the ablation. 2. 1.1 cm focus of arterial phase enhancement in segment 2 of the liver, LR  category-3. Surveillance recommended. 3. Two adjacent left lower rib deformities with some associated enhancement suspicious for subacute rib fractures. 4. Sigmoid colon diverticulosis. 5. Stable infrarenal abdominal aortic ectasia at 2.6 cm. Ectatic abdominal aorta at risk for aneurysm development. Recommend followup by ultrasound in 5 years. This recommendation follows ACR consensus guidelines: White Paper of the ACR Incidental Findings Committee II on Vascular Findings. J Am Coll Radiol 2013; 10:789-794. Aortic aneurysm NOS (ICD10-I71.9). 6.  Aortic Atherosclerosis (ICD10-I70.0). 7. Stable right adrenal adenoma. Electronically Signed   By: Van Clines M.D.   On: 07/10/2019 18:34    Labs:  CBC: Recent Labs    11/19/18 1619 11/25/18 0402 07/05/19 1512  WBC 9.2 15.0* 8.8  HGB 14.6 13.6 14.6  HCT 44.1 42.3 42.4  PLT 280 239 348    COAGS: Recent Labs    11/19/18 1619 07/05/19 1512  INR 1.1 1.1  APTT 40*  --     BMP: Recent Labs    11/19/18 1619 11/25/18 0402 03/15/19 1219 07/05/19 1512  NA 143 142 144 143  K 3.6 4.1 4.0 3.8  CL 102 103 104 102  CO2 27 27 30 27   GLUCOSE 206* 163* 124* 154*  BUN 17 16 15 17   CALCIUM 9.7 8.8* 9.6 9.7  CREATININE 1.13 1.18 1.17 1.19*  GFRNONAA >60 60* 60 59*  GFRAA >60 >60 69 68    LIVER FUNCTION TESTS: Recent Labs    11/19/18 1619 11/25/18 0402 03/15/19 1219 07/05/19 1512  BILITOT 0.8 1.1 0.4 0.6  AST 35 119* 24 26  ALT 39 94* 26 28  ALKPHOS 97 78  --   --   PROT 7.4 6.5  6.4 6.7  ALBUMIN 4.1 3.9  --   --     TUMOR MARKERS: Recent Labs    03/15/19 1219 07/05/19 1512  AFPTM 1.8 1.5  CEA  --  2.8*    Assessment and Plan:  Dionel BERNARDO JODOIN is a 79 y.o. male with past medical history significant for type 2 diabetes, hypertension, hyperlipidemia, morbid obesity obstructive sleep apnea and prostate cancer who is seen in consultation via telemedicine following the acquisition of a postprocedural abdominal MRI performed yesterday, 03/16/2019, following technically successful hepatic microwave ablation performed 11/24/2018 and transcatheter bland embolization performed 02/15/2018 for biopsy-proven hepatocellular carcinoma.  Personal review of abdominal MRI performed 07/10/2019 is negative for definitive evidence of residual/recurrent disease at the ablation site.  Imaging findings supported by persistent non-elevation of the patient's AFP.  Note is made of an approximately 1.1 cm is seen on image 51, series 901, is not definitively seen on other sequences nor seen on previous examinations and while potentially artifactual, warrants continued attention on follow-up.  Patient states that while eligible, he has not yet received the coronavirus vaccine as his granddaughter in law is living with them and is pregnant and he does not he does not wish to get the vaccine until after the baby is born given his concern for giving her the virus.  I explained to the patient that the vaccine does not contain any live virus so there is no chance of him giving his granddaughter in law the coronavirus by being vaccinated.  Plan: - We will proceed with obtaining next surveillance abdominal MRI in 3 months (mid 2021) following acquisition of an additional AFP level.    The patient knows to call the interventional radiology clinic with any interval questions or concerns.  A copy of this report was sent to the requesting provider  on this date.  Electronically Signed: Sandi Mariscal 07/14/2019, 1:45 PM   I spent a total of 10 Minutes in remote  clinical consultation, greater than 50% of which was counseling/coordinating care for post hepatic microwave ablation.    Visit type: Audio only (telephone). Audio (no video) only due to patient's lack of internet/smartphone capability. Alternative for in-person consultation at [facility], [address] [place], [place], [place]. This visit type was conducted due to national recommendations for restrictions regarding the COVID-19 Pandemic (e.g. social distancing).  This format is felt to be most appropriate for this patient at this time.  All issues noted in this document were discussed and addressed.

## 2019-08-08 ENCOUNTER — Other Ambulatory Visit: Payer: Self-pay | Admitting: Interventional Cardiology

## 2019-09-22 ENCOUNTER — Other Ambulatory Visit: Payer: Self-pay

## 2019-09-22 MED ORDER — FUROSEMIDE 40 MG PO TABS: ORAL_TABLET | ORAL | 1 refills | Status: DC

## 2019-09-22 NOTE — Telephone Encounter (Signed)
Pt's medication was sent to pt's pharmacy as requested. Confirmation received.  °

## 2019-10-27 ENCOUNTER — Other Ambulatory Visit: Payer: Self-pay

## 2019-10-27 ENCOUNTER — Other Ambulatory Visit: Payer: Self-pay | Admitting: Interventional Radiology

## 2019-10-27 DIAGNOSIS — C220 Liver cell carcinoma: Secondary | ICD-10-CM

## 2019-11-24 ENCOUNTER — Other Ambulatory Visit (HOSPITAL_COMMUNITY): Payer: Medicare Other

## 2019-12-01 ENCOUNTER — Telehealth: Payer: Medicare Other

## 2019-12-12 LAB — COMPLETE METABOLIC PANEL WITH GFR
AG Ratio: 1.5 (calc) (ref 1.0–2.5)
ALT: 13 U/L (ref 9–46)
AST: 10 U/L (ref 10–35)
Albumin: 3.9 g/dL (ref 3.6–5.1)
Alkaline phosphatase (APISO): 129 U/L (ref 35–144)
BUN: 14 mg/dL (ref 7–25)
CO2: 26 mmol/L (ref 20–32)
Calcium: 9.3 mg/dL (ref 8.6–10.3)
Chloride: 103 mmol/L (ref 98–110)
Creat: 1.02 mg/dL (ref 0.70–1.18)
GFR, Est African American: 82 mL/min/{1.73_m2} (ref >=60)
GFR, Est Non African American: 71 mL/min/{1.73_m2} (ref >=60)
Globulin: 2.6 g/dL (calc) (ref 1.9–3.7)
Glucose, Bld: 198 mg/dL — ABNORMAL HIGH (ref 65–139)
Potassium: 3.9 mmol/L (ref 3.5–5.3)
Sodium: 141 mmol/L (ref 135–146)
Total Bilirubin: 0.7 mg/dL (ref 0.2–1.2)
Total Protein: 6.5 g/dL (ref 6.1–8.1)

## 2019-12-12 LAB — CBC
HCT: 39.7 % (ref 38.5–50.0)
Hemoglobin: 13 g/dL — ABNORMAL LOW (ref 13.2–17.1)
MCH: 26.6 pg — ABNORMAL LOW (ref 27.0–33.0)
MCHC: 32.7 g/dL (ref 32.0–36.0)
MCV: 81.4 fL (ref 80.0–100.0)
MPV: 10.4 fL (ref 7.5–12.5)
Platelets: 430 10*3/uL — ABNORMAL HIGH (ref 140–400)
RBC: 4.88 10*6/uL (ref 4.20–5.80)
RDW: 14 % (ref 11.0–15.0)
WBC: 12.4 10*3/uL — ABNORMAL HIGH (ref 3.8–10.8)

## 2019-12-12 LAB — AFP TUMOR MARKER: AFP-Tumor Marker: 1.2 ng/mL (ref <6.1)

## 2019-12-12 LAB — PROTIME-INR
INR: 1
Prothrombin Time: 10.9 s (ref 9.0–11.5)

## 2019-12-23 ENCOUNTER — Ambulatory Visit (HOSPITAL_COMMUNITY)
Admission: RE | Admit: 2019-12-23 | Discharge: 2019-12-23 | Disposition: A | Discharge status: Home / Self Care | Payer: Medicare Other | Source: Ambulatory Visit | Attending: Interventional Radiology | Admitting: Interventional Radiology

## 2019-12-23 ENCOUNTER — Other Ambulatory Visit: Payer: Self-pay

## 2019-12-23 DIAGNOSIS — C220 Liver cell carcinoma: Secondary | ICD-10-CM | POA: Diagnosis present

## 2019-12-23 MED ORDER — GADOBUTROL 1 MMOL/ML IV SOLN
10.0000 mL | Freq: Once | INTRAVENOUS | Status: AC | PRN
  Administered 2019-12-23: 10 mL via INTRAVENOUS

## 2019-12-27 ENCOUNTER — Encounter: Payer: Self-pay | Admitting: *Deleted

## 2019-12-27 ENCOUNTER — Other Ambulatory Visit: Payer: Self-pay

## 2019-12-27 ENCOUNTER — Ambulatory Visit
Admission: RE | Admit: 2019-12-27 | Discharge: 2019-12-27 | Disposition: A | Discharge status: Home / Self Care | Payer: Medicare Other | Source: Ambulatory Visit | Attending: Interventional Radiology | Admitting: Interventional Radiology

## 2019-12-27 DIAGNOSIS — C220 Liver cell carcinoma: Secondary | ICD-10-CM

## 2019-12-27 HISTORY — PX: IR RADIOLOGIST EVAL & MGMT: IMG5224

## 2019-12-27 NOTE — Progress Notes (Signed)
Patient ID: [name], male   DOB: 01/30/1941, 79 y.o.   MRN: [id number]         Chief Complaint: Post hepatic microwave ablation (11/24/2018)  Referring Physician(s): Burr Medico (Oncology) [name] (PCP)  History of Present Illness:  Steve Jensen Jensen is a 79 y.o. male with past medical history significant for type 2 diabetes, hypertension, hyperlipidemia, morbid obesity obstructive sleep apnea and prostate cancer who is seen in consultation via telemedicine following the acquisition of a postprocedural abdominal MRI performed 12/24/2019, following technically successful hepatic microwave ablation performed 11/24/2018 and transcatheter bland embolization performed 02/15/2018 for biopsy-proven hepatocellular carcinoma.  Patient reports right knee pain secondary to arthritis for which he is being evaluated by Dr. Wynelle Link.  He is otherwise without complaint.  Specifically, no abdominal pain, fever or chills.  No increased abdominal girth.  No yellowing of the skin or eyes.  No change in appetite or energy level.  The patient reports he has received his COVID-19 vaccination.   Past Medical History:  Diagnosis Date  . Anemia   . Diabetes mellitus type II, controlled ([place])    with neuropathy  . Dysrhythmia    A-Fib. cardioversion done  . GERD (gastroesophageal reflux disease)   . Hyperlipidemia   . Hypertension   . Morbid obesity (Steve Jensen)   . Neuromuscular disorder (Steve Jensen Jensen)    feet  . OSA (obstructive sleep apnea)    On BiPAP at 15/11cm H2O  . Prostate cancer (Steve Jensen Jensen)   . Shingles    on face Nov 2010  . Urinary incontinence     Past Surgical History:  Procedure Laterality Date  . CARDIOVERSION N/A 09/06/2013   Procedure: CARDIOVERSION;  Surgeon: [name], MD;  Location: [facility] ENDOSCOPY;  Service: Cardiovascular;  Laterality: N/A;  . EYE SURGERY     cataract surgery bilat; surgery to correct droopy eyelids   . IR ANGIOGRAM SELECTIVE EACH ADDITIONAL VESSEL  02/15/2018  . IR  ANGIOGRAM SELECTIVE EACH ADDITIONAL VESSEL  02/15/2018  . IR ANGIOGRAM SELECTIVE EACH ADDITIONAL VESSEL  02/15/2018  . IR ANGIOGRAM SELECTIVE EACH ADDITIONAL VESSEL  02/15/2018  . IR ANGIOGRAM SELECTIVE EACH ADDITIONAL VESSEL  02/15/2018  . IR ANGIOGRAM VISCERAL SELECTIVE  02/15/2018  . IR ANGIOGRAM VISCERAL SELECTIVE  02/15/2018  . IR EMBO TUMOR ORGAN ISCHEMIA INFARCT INC GUIDE ROADMAPPING  02/15/2018  . IR RADIOLOGIST EVAL & MGMT  01/26/2018  . IR RADIOLOGIST EVAL & MGMT  03/17/2018  . IR RADIOLOGIST EVAL & MGMT  06/29/2018  . IR RADIOLOGIST EVAL & MGMT  07/27/2018  . IR RADIOLOGIST EVAL & MGMT  10/28/2018  . IR RADIOLOGIST EVAL & MGMT  12/14/2018  . IR RADIOLOGIST EVAL & MGMT  03/17/2019  . IR RADIOLOGIST EVAL & MGMT  07/14/2019  . IR US GUIDE VASC ACCESS RIGHT  02/15/2018  . knee surgery bilat     . PROSTATE SURGERY    . RADIOLOGY WITH ANESTHESIA N/A 11/24/2018   Procedure: MICROWAVE THERMAL ABLATION LIVER;  Surgeon: Sandi Mariscal, MD;  Location: [facility] ORS;  Service: Anesthesiology;  Laterality: N/A;  . right shoulder rotator cuff surgery    . TONSILLECTOMY      Allergies: Lipitor [atorvastatin], Simvastatin, and Zithromax [azithromycin]  Medications: Prior to Admission medications   Medication Sig Start Date End Date Taking? Authorizing Provider  apixaban (ELIQUIS) 5 MG TABS tablet TAKE 1 TABLET(5 MG) BY MOUTH TWICE DAILY 11/15/18   [name], MD  CINNAMON PO Take 1,000 mg by mouth daily.  [provider]  diltiazem (CARDIZEM CD) 240 MG 24 hr capsule Take 1 capsule (240 mg total) by mouth daily. 07/14/19   [name], MD  fish oil-omega-3 fatty acids 1000 MG capsule Take 2 g by mouth 2 (two) times daily.     [provider]  furosemide (LASIX) 40 MG tablet TAKE 1 TABLET BY MOUTH TWICE DAILY AS NEEDED 09/22/19   [name], MD  ibuprofen (ADVIL,MOTRIN) 200 MG tablet Take 800 mg by mouth every 6 (six) hours as needed (PAIN). prn 06/30/13   [provider]  loratadine (CLARITIN) 10 MG tablet Take 10 mg by mouth daily as needed for allergies.     [provider]  metFORMIN (GLUCOPHAGE) 1000 MG tablet Take 1,000 mg by mouth 2 (two) times daily. 06/27/14   [provider]  metoprolol succinate (TOPROL-XL) 50 MG 24 hr tablet Take 50 mg by mouth daily.  08/10/13   [provider]  Multiple Vitamins-Minerals (MULTIVITAMIN WITH MINERALS) tablet Take 1 tablet by mouth daily.    [provider]  omeprazole (PRILOSEC) 20 MG capsule Take 20 mg by mouth daily.  11/29/12   [provider]  oxymetazoline (AFRIN) 0.05 % nasal spray Place 1 spray into both nostrils 2 (two) times daily as needed for congestion.    [provider]  potassium chloride SA (KLOR-CON) 20 MEQ tablet TAKE 1 TABLET BY MOUTH  DAILY AS NEEDED WHILE  TAKING FUROSEMIDE 08/08/19   [name], MD  pregabalin (LYRICA) 100 MG capsule Take 100 mg by mouth 2 (two) times a day.  09/14/18   [provider]  ramipril (ALTACE) 10 MG capsule Take 10 mg by mouth 2 (two) times daily. 01/11/13   [provider]  Semaglutide (OZEMPIC) 1 MG/DOSE SOPN Inject 1 mg into the skin once a week. Monday     [provider]  ZETIA 10 MG tablet Take 10 mg by mouth daily.  01/17/13   [provider]     Family History  Problem Relation Age of Onset  . Hypertension Brother   . Diabetes Brother   . Cancer Brother        bladder cancer  . Diabetes Brother   . CVA Brother   . Hypertension Brother   . Prostate cancer Brother   . Diabetes Brother   . Hypertension Brother   . Heart disease Brother        CABG  . Heart attack Brother   . Stroke Brother     Social History   Socioeconomic History  . Marital status: Widowed    Spouse name: Not on file  . Number of children: Not on file  . Years of education: Not on file  . Highest education level: Not on file  Occupational History  . Not on file  Tobacco Use  .  Smoking status: Former Smoker    Packs/day: 1.00    Years: 15.00    Pack years: 15.00    Quit date: 05/27/1975    Years since quitting: 44.6  . Smokeless tobacco: Never Used  Vaping Use  . Vaping Use: Never used  Substance and Sexual Activity  . Alcohol use: No  . Drug use: No  . Sexual activity: Not on file  Other Topics Concern  . Not on file  Social History Narrative  . Not on file   Social Determinants of Health   Financial Resource Strain:   . Difficulty of Paying Living  Expenses:   Food Insecurity:   . Worried About Charity fundraiser in the Last Year:   . Arboriculturist in the Last Year:   Transportation Needs:   . Film/video editor (Medical):   Marland Kitchen Lack of Transportation (Non-Medical):   Physical Activity:   . Days of Exercise per Week:   . Minutes of Exercise per Session:   Stress:   . Feeling of Stress :   Social Connections:   . Frequency of Communication with Friends and Family:   . Frequency of Social Gatherings with Friends and Family:   . Attends Religious Services:   . Active Member of Clubs or Organizations:   . Attends Archivist Meetings:   Marland Kitchen Marital Status:     ECOG Status: 0 - Asymptomatic  Review of Systems  Review of Systems: A 12 point ROS discussed and pertinent positives are indicated in the HPI above.  All other systems are negative.  Physical Exam No direct physical exam was performed (except for noted visual exam findings with Video Visits).   Vital Signs: There were no vitals taken for this visit.  Imaging: MR ABDOMEN WWO CONTRAST  Result Date: 12/24/2019 CLINICAL DATA:  Cirrhosis. History of ablation for [place]. 11 mm focus of arterial phase hyperenhancement on prior imaging. EXAM: MRI ABDOMEN WITHOUT AND WITH CONTRAST TECHNIQUE: Multiplanar multisequence MR imaging of the abdomen was performed both before and after the administration of intravenous contrast. CONTRAST:  3mL GADAVIST GADOBUTROL 1 MMOL/ML IV SOLN  COMPARISON:  07/10/2019 FINDINGS: Markedly motion degraded study. Lower chest: Unremarkable. Hepatobiliary: Posterior right hepatic lobe ablation defect again noted with some nodular inferolateral T1 shortening on precontrast imaging likely related to blood products / granulation. No definite arterial phase hyperenhancement in the lesion although assessment is limited by motion artifact. Defect measures approximately 5.0 x 3.2 cm today compared to 4.8 x 3.1 cm previously. The second area of questionable arterial phase hyperenhancement in segment 2 is probably represented on image 47 of series 901 today. This measures approximately 1.3 cm. There is some washout to background parenchymal enhancement levels on later phase imaging but no definite washout greater than liver parenchyma or persistent rim enhancing capsule evident although this could be easily obscured by the substantial motion artifact. Lesion remains compatible with LI-RADS 3. No new focus of arterial phase hyperenhancement on today's exam. There is no evidence for gallstones, gallbladder wall thickening, or pericholecystic fluid. No intrahepatic or extrahepatic biliary dilation. Pancreas:  Unremarkable. Spleen:  No splenomegaly. No focal mass lesion. Adrenals/Urinary Tract: Stable right adrenal adenoma. Tiny nonenhancing lesion interpolar left kidney is compatible with a tiny cyst. Stomach/Bowel: Small hiatal hernia. Stomach is otherwise unremarkable. No gastric wall thickening. No evidence of outlet obstruction. Duodenum is normally positioned as is the ligament of [place]itz. No small bowel or colonic dilatation within the visualized abdomen. Vascular/Lymphatic: No abdominal aortic aneurysm. No abdominal lymphadenopathy. Other:  No intraperitoneal free fluid. Musculoskeletal: No focal suspicious marrow enhancement within the visualized bony anatomy. IMPRESSION: 1. Stable appearance of the ablation defect in the posterior right hepatic lobe. No definite  findings to suggest residual or recurrent disease on today's exam. 2. The second area of questionable arterial phase hyperenhancement in segment 2 is similar in size in the interval. Assessment is markedly limited by the substantial motion artifact on today's study. This remains compatible with LI-RADS 3. No new focal arterial phase hyperenhancement in today's exam. 3. Stable right adrenal adenoma. 4. Small hiatal hernia. Electronically  Signed   By: Misty Stanley M.D.   On: 12/24/2019 15:25    Labs:  CBC: Recent Labs    07/05/19 1512 12/09/19 1554  WBC 8.8 12.4*  HGB 14.6 13.0*  HCT 42.4 39.7  PLT 348 430*    COAGS: Recent Labs    07/05/19 1512 12/09/19 1554  INR 1.1 1.0    BMP: Recent Labs    03/15/19 1219 07/05/19 1512 12/09/19 1554  NA 144 143 141  K 4.0 3.8 3.9  CL 104 102 103  CO2 30 27 26   GLUCOSE 124* 154* 198*  BUN 15 17 14   CALCIUM 9.6 9.7 9.3  CREATININE 1.17 1.19* 1.02  GFRNONAA 60 59* 71  GFRAA 69 68 82    LIVER FUNCTION TESTS: Recent Labs    03/15/19 1219 07/05/19 1512 12/09/19 1554  BILITOT 0.4 0.6 0.7  AST 24 26 10   ALT 26 28 13   PROT 6.4 6.7 6.5    TUMOR MARKERS: Recent Labs    03/15/19 1219 07/05/19 1512 12/09/19 1554  AFPTM 1.8 1.5 1.2  CEA  --  2.8*  --     Assessment and Plan:  Amogh ALVIS EDGELL is a 79 y.o. male with past medical history significant for type 2 diabetes, hypertension, hyperlipidemia, morbid obesity obstructive sleep apnea and prostate cancer who is seen in consultation via telemedicine following the acquisition of a postprocedural abdominal MRI performed yesterday, 03/16/2019, following technically successful hepatic microwave ablation performed 11/24/2018 and transcatheter bland embolization performed 02/15/2018 for biopsy-proven hepatocellular carcinoma.  I am pleased to report that postprocedural contrast-enhanced abdominal MRI performed 12/24/19 is negative for evidence of residual or locally recurrent disease.   This finding is supported by persistent normalization of the patient's AFP level (measuring 1.2 on 12/09/2019).  Patient's LFTs remain normal.  Previously questioned 1.3 cm lesion within the dome of the left lobe of the liver (image 47, series 901) is unchanged compared to the 07/10/2019 examination, though remains indeterminate in part due to patient respiratory artifact and thus warrants continued follow-up.   Plan: -Patient will return to the interventional radiology clinic following acquisition of next surveillance abdominal MRI in 6 months (February 2022) and AFP level.    Note, the patient again reports having had another excellent experience at [facility] for his abdominal MRI and we will continue to send him there for his scans.  The patient knows to call the interventional radiology clinic with any interval questions or concerns.  Thank you for this interesting consult.  I greatly enjoyed meeting Conor F Voong and look forward to participating in their care.  A copy of this report was sent to the requesting provider on this date.  Electronically Signed: Sandi Mariscal 12/27/2019, 12:03 PM   I spent a total of 10 Minutes in remote  clinical consultation, greater than 50% of which was counseling/coordinating care for hepatic ablation.    Visit type: Audio only (telephone). Audio (no video) only due to patient's lack of internet/smartphone capability. Alternative for in-person consultation at [facility], [address] [place], [place], [place]. This visit type was conducted due to national recommendations for restrictions regarding the COVID-19 Pandemic (e.g. social distancing).  This format is felt to be most appropriate for this patient at this time.  All issues noted in this document were discussed and addressed.

## 2020-01-18 ENCOUNTER — Other Ambulatory Visit: Payer: Self-pay | Admitting: Interventional Cardiology

## 2020-01-31 ENCOUNTER — Other Ambulatory Visit: Payer: Self-pay | Admitting: Orthopedic Surgery

## 2020-01-31 DIAGNOSIS — M25531 Pain in right wrist: Secondary | ICD-10-CM

## 2020-01-31 DIAGNOSIS — M25831 Other specified joint disorders, right wrist: Secondary | ICD-10-CM

## 2020-02-16 ENCOUNTER — Encounter: Payer: Self-pay | Admitting: Cardiology

## 2020-02-16 ENCOUNTER — Other Ambulatory Visit: Payer: Self-pay

## 2020-02-16 ENCOUNTER — Telehealth (INDEPENDENT_AMBULATORY_CARE_PROVIDER_SITE_OTHER): Payer: Medicare Other | Admitting: Cardiology

## 2020-02-16 ENCOUNTER — Telehealth: Payer: Self-pay | Admitting: *Deleted

## 2020-02-16 VITALS — BP 146/75 | Ht 71.0 in | Wt 298.0 lb

## 2020-02-16 DIAGNOSIS — I10 Essential (primary) hypertension: Secondary | ICD-10-CM

## 2020-02-16 DIAGNOSIS — G4733 Obstructive sleep apnea (adult) (pediatric): Secondary | ICD-10-CM

## 2020-02-16 NOTE — Telephone Encounter (Signed)
  Patient Consent for Virtual Visit         Steve Jensen has provided verbal consent on 02/16/2020 for a virtual visit (video or telephone).   CONSENT FOR VIRTUAL VISIT FOR:  Steve Jensen  By participating in this virtual visit I agree to the following:  I hereby voluntarily request, consent and authorize [facility] and its employed or contracted physicians, physician assistants, nurse practitioners or other licensed health care professionals (the Practitioner), to provide me with telemedicine health care services (the "Services") as deemed necessary by the treating Practitioner. I acknowledge and consent to receive the Services by the Practitioner via telemedicine. I understand that the telemedicine visit will involve communicating with the Practitioner through live audiovisual communication technology and the disclosure of certain medical information by electronic transmission. I acknowledge that I have been given the opportunity to request an in-person assessment or other available alternative prior to the telemedicine visit and am voluntarily participating in the telemedicine visit.  I understand that I have the right to withhold or withdraw my consent to the use of telemedicine in the course of my care at any time, without affecting my right to future care or treatment, and that the Practitioner or I may terminate the telemedicine visit at any time. I understand that I have the right to inspect all information obtained and/or recorded in the course of the telemedicine visit and may receive copies of available information for a reasonable fee.  I understand that some of the potential risks of receiving the Services via telemedicine include:  Marland Kitchen Delay or interruption in medical evaluation due to technological equipment failure or disruption; . Information transmitted may not be sufficient (e.g. poor resolution of images) to allow for appropriate medical decision making by the Practitioner;  and/or  . In rare instances, security protocols could fail, causing a breach of personal health information.  Furthermore, I acknowledge that it is my responsibility to provide information about my medical history, conditions and care that is complete and accurate to the best of my ability. I acknowledge that Practitioner's advice, recommendations, and/or decision may be based on factors not within their control, such as incomplete or inaccurate data provided by me or distortions of diagnostic images or specimens that may result from electronic transmissions. I understand that the practice of medicine is not an exact science and that Practitioner makes no warranties or guarantees regarding treatment outcomes. I acknowledge that a copy of this consent can be made available to me via my patient portal ([facility] [place]), or I can request a printed copy by calling the office of [facility].    I understand that my insurance will be billed for this visit.   I have read or had this consent read to me. . I understand the contents of this consent, which adequately explains the benefits and risks of the Services being provided via telemedicine.  . I have been provided ample opportunity to ask questions regarding this consent and the Services and have had my questions answered to my satisfaction. . I give my informed consent for the services to be provided through the use of telemedicine in my medical care

## 2020-02-16 NOTE — Patient Instructions (Signed)
Medication Instructions:  Your physician recommends that you continue on your current medications as directed. Please refer to the Current Medication list given to you today.  *If you need a refill on your cardiac medications before your next appointment, please call your pharmacy*   Lab Work: none If you have labs (blood work) drawn today and your tests are completely normal, you will receive your results only by: . MyChart Message (if you have MyChart) OR . A paper copy in the mail If you have any lab test that is abnormal or we need to change your treatment, we will call you to review the results.   Testing/Procedures: none   Follow-Up: At [facility], you and your health needs are our priority.  As part of our continuing mission to provide you with exceptional heart care, we have created designated Provider Care Teams.  These Care Teams include your primary Cardiologist (physician) and Advanced Practice Providers (APPs -  Physician Assistants and Nurse Practitioners) who all work together to provide you with the care you need, when you need it.  We recommend signing up for the patient portal called "MyChart".  Sign up information is provided on this After Visit Summary.  MyChart is used to connect with patients for Virtual Visits (Telemedicine).  Patients are able to view lab/test results, encounter notes, upcoming appointments, etc.  Non-urgent messages can be sent to your provider as well.   To learn more about what you can do with MyChart, go to [email or web address].    Your next appointment:   12 month(s)  The format for your next appointment:   Virtual Visit   Provider:   Traci Turner, MD   Other Instructions   

## 2020-02-16 NOTE — Progress Notes (Addendum)
Virtual Visit via Telephone Note   This visit type was conducted due to national recommendations for restrictions regarding the COVID-19 Pandemic (e.g. social distancing) in an effort to limit this patient's exposure and mitigate transmission in our community.  Due to his co-morbid illnesses, this patient is at least at moderate risk for complications without adequate follow up.  This format is felt to be most appropriate for this patient at this time.  All issues noted in this document were discussed and addressed.  A limited physical exam was performed with this format.  Please refer to the patient's chart for his consent to telehealth for [facility].   Evaluation Performed:  Follow-up visit  This visit type was conducted due to national recommendations for restrictions regarding the COVID-19 Pandemic (e.g. social distancing).  This format is felt to be most appropriate for this patient at this time.  All issues noted in this document were discussed and addressed.  No physical exam was performed (except for noted visual exam findings with Video Visits).  Please refer to the patient's chart (MyChart message for video visits and phone note for telephone visits) for the patient's consent to telehealth for [facility].  Date:  02/16/2020   ID:  Steve Jensen, DOB [birth year], MRN [id number]  Patient Location:  Home  Provider location:   [place]  PCP:  [name], MD  Sleep Medicine:  Fransico Him, ND Electrophysiologist:  None   Chief Complaint:  OSA  History of Present Illness:    Steve Jensen is a 79 y.o. male who presents via audio/video conferencing for a telehealth visit today.    Steve Jensen is a 79 y.o. male with a hx of OSA on BiPAP, morbid obesity and HTN.  He is doing well with his BiPAP device and thinks that he has gotten used to it.  He tolerates the mask and feels the pressure is adequate.  Since going on BiPAP he feels rested in the am and has no  significant daytime sleepiness.  He denies any significant mouth or nasal dryness or nasal congestion.  He does not think that he snores.    The patient does not have symptoms concerning for COVID-19 infection (fever, chills, cough, or new shortness of breath).   Prior CV studies:   The following studies were reviewed today:  PAP download  Past Medical History:  Diagnosis Date  . Anemia   . Diabetes mellitus type II, controlled ([place])    with neuropathy  . Dysrhythmia    A-Fib. cardioversion done  . GERD (gastroesophageal reflux disease)   . Hyperlipidemia   . Hypertension   . Morbid obesity ([place])   . Neuromuscular disorder ([place])    feet  . OSA (obstructive sleep apnea)    On BiPAP at 15/11cm H2O  . Prostate cancer ([place])   . Shingles    on face Nov 2010  . Urinary incontinence    Past Surgical History:  Procedure Laterality Date  . CARDIOVERSION N/A 09/06/2013   Procedure: CARDIOVERSION;  Surgeon: [name], MD;  Location: [facility] ENDOSCOPY;  Service: Cardiovascular;  Laterality: N/A;  . EYE SURGERY     cataract surgery bilat; surgery to correct droopy eyelids   . IR ANGIOGRAM SELECTIVE EACH ADDITIONAL VESSEL  02/15/2018  . IR ANGIOGRAM SELECTIVE EACH ADDITIONAL VESSEL  02/15/2018  . IR ANGIOGRAM SELECTIVE EACH ADDITIONAL VESSEL  02/15/2018  . IR ANGIOGRAM SELECTIVE EACH ADDITIONAL VESSEL  02/15/2018  . [place]  ADDITIONAL VESSEL  02/15/2018  . IR ANGIOGRAM VISCERAL SELECTIVE  02/15/2018  . IR ANGIOGRAM VISCERAL SELECTIVE  02/15/2018  . IR EMBO TUMOR ORGAN ISCHEMIA INFARCT INC GUIDE ROADMAPPING  02/15/2018  . IR RADIOLOGIST EVAL & MGMT  01/26/2018  . IR RADIOLOGIST EVAL & MGMT  03/17/2018  . IR RADIOLOGIST EVAL & MGMT  06/29/2018  . IR RADIOLOGIST EVAL & MGMT  07/27/2018  . IR RADIOLOGIST EVAL & MGMT  10/28/2018  . IR RADIOLOGIST EVAL & MGMT  12/14/2018  . IR RADIOLOGIST EVAL & MGMT  03/17/2019  . IR RADIOLOGIST EVAL & MGMT  07/14/2019  . IR RADIOLOGIST EVAL &  MGMT  12/27/2019  . IR US GUIDE VASC ACCESS RIGHT  02/15/2018  . knee surgery bilat     . PROSTATE SURGERY    . RADIOLOGY WITH ANESTHESIA N/A 11/24/2018   Procedure: MICROWAVE THERMAL ABLATION LIVER;  Surgeon: Sandi Mariscal, MD;  Location: [facility] ORS;  Service: Anesthesiology;  Laterality: N/A;  . right shoulder rotator cuff surgery    . TONSILLECTOMY       Current Meds  Medication Sig  . apixaban (ELIQUIS) 5 MG TABS tablet TAKE 1 TABLET(5 MG) BY MOUTH TWICE DAILY  . CINNAMON PO Take 1,000 mg by mouth daily.  Marland Kitchen diltiazem (CARDIZEM CD) 240 MG 24 hr capsule Take 1 capsule (240 mg total) by mouth daily.  . fish oil-omega-3 fatty acids 1000 MG capsule Take 2 g by mouth 2 (two) times daily.   . furosemide (LASIX) 40 MG tablet TAKE 1 TABLET BY MOUTH  TWICE DAILY AS NEEDED  . ibuprofen (ADVIL,MOTRIN) 200 MG tablet Take 800 mg by mouth every 6 (six) hours as needed (PAIN). prn  . loratadine (CLARITIN) 10 MG tablet Take 10 mg by mouth daily as needed for allergies.   . metFORMIN (GLUCOPHAGE) 1000 MG tablet Take 1,000 mg by mouth 2 (two) times daily.  . metoprolol succinate (TOPROL-XL) 50 MG 24 hr tablet Take 50 mg by mouth daily.   . Multiple Vitamins-Minerals (MULTIVITAMIN WITH MINERALS) tablet Take 1 tablet by mouth daily.  Marland Kitchen omeprazole (PRILOSEC) 20 MG capsule Take 20 mg by mouth daily.   Marland Kitchen oxymetazoline (AFRIN) 0.05 % nasal spray Place 1 spray into both nostrils 2 (two) times daily as needed for congestion.  . potassium chloride SA (KLOR-CON) 20 MEQ tablet TAKE 1 TABLET BY MOUTH  DAILY AS NEEDED WHILE  TAKING FUROSEMIDE  . pregabalin (LYRICA) 100 MG capsule Take 100 mg by mouth 2 (two) times a day.   . ramipril (ALTACE) 10 MG capsule Take 10 mg by mouth 2 (two) times daily.  . Semaglutide (OZEMPIC) 1 MG/DOSE SOPN Inject 1 mg into the skin once a week. Monday   . ZETIA 10 MG tablet Take 10 mg by mouth daily.      Allergies:   Lipitor [atorvastatin], Simvastatin, and Zithromax [azithromycin]   Social  History   Tobacco Use  . Smoking status: Former Smoker    Packs/day: 1.00    Years: 15.00    Pack years: 15.00    Quit date: 05/27/1975    Years since quitting: 44.7  . Smokeless tobacco: Never Used  Vaping Use  . Vaping Use: Never used  Substance Use Topics  . Alcohol use: No  . Drug use: No     Family Hx: The patient's family history includes CVA in his brother; Cancer in his brother; Diabetes in his brother, brother, and brother; Heart attack in his brother; Heart disease in  his brother; Hypertension in his brother, brother, and brother; Prostate cancer in his brother; Stroke in his brother.  ROS:   Please see the history of present illness.     All other systems reviewed and are negative.   Labs/Other Tests and Data Reviewed:    Recent Labs: 12/09/2019: ALT 13; BUN 14; Creat 1.02; Hemoglobin 13.0; Platelets 430; Potassium 3.9; Sodium 141   Recent Lipid Panel Lab Results  Component Value Date/Time   CHOL 158 12/18/2014 10:39 AM   TRIG 188.0 (H) 12/18/2014 10:39 AM   HDL 31.00 (L) 12/18/2014 10:39 AM   CHOLHDL 5 12/18/2014 10:39 AM   LDLCALC 89 12/18/2014 10:39 AM    Wt Readings from Last 3 Encounters:  02/16/20 298 lb (135.2 kg)  06/17/19 (!) 306 lb 12.8 oz (139.2 kg)  11/24/18 (!) 305 lb (138.3 kg)     Objective:    Vital Signs:  BP (!) 146/75   Ht 5\' 11"  (1.803 m)   Wt 298 lb (135.2 kg)   BMI 41.56 kg/m     ASSESSMENT & PLAN:    1.  OSA -  The patient is tolerating PAP therapy well without any problems. The PAP download was reviewed today and showed an AHI of 0.6/hr on 15/11 cm H2O with 97% compliance in using more than 4 hours nightly.  The patient has been using and benefiting from PAP use and will continue to benefit from therapy.   2.  Hypertension  -Bp borderline controlled but has not taken his BP meds yet -continue Ramipril 10mg  BID, Toprol XL 50mg  daily and Cardizem CD 240mg  daily -SCr 1.02 in July 2021  3.  Morbid Obesity  -He is unable to  exercise due to severe peripheral neuropathy.   4.  COVID-19 Education: He has been vaccinated  Patient Risk:   After full review of this patient's clinical status, I feel that they are at least moderate risk at this time.  Time:   Today, I have spent 20 minutes on telemedicine discussing medical problems including OSA, HTN, obesity and reviewing patient's chart including PAP compliance download.  Medication Adjustments/Labs and Tests Ordered: Current medicines are reviewed at length with the patient today.  Concerns regarding medicines are outlined above.  Tests Ordered: No orders of the defined types were placed in this encounter.  Medication Changes: No orders of the defined types were placed in this encounter.   Disposition:  Follow up in 1 year(s)  Signed, Fransico Him, MD  02/16/2020 10:06 AM    [place]

## 2020-02-22 ENCOUNTER — Ambulatory Visit
Admission: RE | Admit: 2020-02-22 | Discharge: 2020-02-22 | Disposition: A | Discharge status: Home / Self Care | Payer: Medicare Other | Source: Ambulatory Visit | Attending: Orthopedic Surgery | Admitting: Orthopedic Surgery

## 2020-02-22 ENCOUNTER — Other Ambulatory Visit: Payer: Self-pay

## 2020-02-22 DIAGNOSIS — M25831 Other specified joint disorders, right wrist: Secondary | ICD-10-CM

## 2020-02-22 DIAGNOSIS — M25531 Pain in right wrist: Secondary | ICD-10-CM

## 2020-02-22 MED ORDER — IOPAMIDOL (ISOVUE-M 200) INJECTION 41%
2.0000 mL | Freq: Once | INTRAMUSCULAR | Status: AC
  Administered 2020-02-22: 2 mL via INTRA_ARTICULAR

## 2020-05-26 ENCOUNTER — Other Ambulatory Visit: Payer: Self-pay | Admitting: Interventional Cardiology

## 2020-05-29 MED ORDER — POTASSIUM CHLORIDE CRYS ER 20 MEQ PO TBCR: EXTENDED_RELEASE_TABLET | ORAL | 3 refills | Status: DC

## 2020-06-04 ENCOUNTER — Other Ambulatory Visit: Payer: Self-pay | Admitting: Interventional Cardiology

## 2020-06-06 DIAGNOSIS — M25532 Pain in left wrist: Secondary | ICD-10-CM | POA: Diagnosis not present

## 2020-06-06 DIAGNOSIS — M1812 Unilateral primary osteoarthritis of first carpometacarpal joint, left hand: Secondary | ICD-10-CM | POA: Diagnosis not present

## 2020-06-13 DIAGNOSIS — M10031 Idiopathic gout, right wrist: Secondary | ICD-10-CM | POA: Diagnosis not present
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
## 2020-06-18 ENCOUNTER — Ambulatory Visit: Payer: Medicare Other | Admitting: Interventional Cardiology

## 2020-06-18 DIAGNOSIS — I110 Hypertensive heart disease with heart failure: Secondary | ICD-10-CM

## 2020-06-18 DIAGNOSIS — I480 Paroxysmal atrial fibrillation: Secondary | ICD-10-CM

## 2020-06-18 DIAGNOSIS — I5032 Chronic diastolic (congestive) heart failure: Secondary | ICD-10-CM

## 2020-06-20 DIAGNOSIS — J1282 Pneumonia due to coronavirus disease 2019: Secondary | ICD-10-CM | POA: Diagnosis not present

## 2020-06-20 DIAGNOSIS — I482 Chronic atrial fibrillation, unspecified: Secondary | ICD-10-CM | POA: Diagnosis not present

## 2020-06-20 DIAGNOSIS — M109 Gout, unspecified: Secondary | ICD-10-CM | POA: Diagnosis not present

## 2020-06-20 DIAGNOSIS — U071 COVID-19: Secondary | ICD-10-CM | POA: Diagnosis not present

## 2020-06-20 DIAGNOSIS — E1140 Type 2 diabetes mellitus with diabetic neuropathy, unspecified: Secondary | ICD-10-CM | POA: Diagnosis not present

## 2020-07-03 ENCOUNTER — Other Ambulatory Visit: Payer: Self-pay | Admitting: Interventional Cardiology

## 2020-07-16 ENCOUNTER — Other Ambulatory Visit: Payer: Self-pay | Admitting: Interventional Cardiology

## 2020-07-17 MED ORDER — DILTIAZEM HCL ER COATED BEADS 240 MG PO CP24: 240.0000 mg | ORAL_CAPSULE | Freq: Every day | ORAL | 0 refills | Status: DC

## 2020-07-19 ENCOUNTER — Other Ambulatory Visit: Payer: Self-pay | Admitting: Interventional Radiology

## 2020-07-19 ENCOUNTER — Other Ambulatory Visit: Payer: Self-pay

## 2020-07-19 DIAGNOSIS — C220 Liver cell carcinoma: Secondary | ICD-10-CM
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
## 2020-07-26 ENCOUNTER — Other Ambulatory Visit: Payer: Self-pay

## 2020-07-26 ENCOUNTER — Encounter: Payer: Self-pay | Admitting: Interventional Cardiology

## 2020-07-26 ENCOUNTER — Ambulatory Visit: Payer: Medicare Other | Admitting: Interventional Cardiology

## 2020-07-26 VITALS — BP 136/62 | HR 95 | Ht 71.0 in | Wt 306.4 lb

## 2020-07-26 DIAGNOSIS — I480 Paroxysmal atrial fibrillation: Secondary | ICD-10-CM | POA: Diagnosis not present

## 2020-07-26 DIAGNOSIS — G4733 Obstructive sleep apnea (adult) (pediatric): Secondary | ICD-10-CM | POA: Diagnosis not present

## 2020-07-26 DIAGNOSIS — I110 Hypertensive heart disease with heart failure: Secondary | ICD-10-CM | POA: Diagnosis not present

## 2020-07-26 DIAGNOSIS — I5032 Chronic diastolic (congestive) heart failure: Secondary | ICD-10-CM | POA: Diagnosis not present

## 2020-07-26 NOTE — Addendum Note (Signed)
Addended by: Thompson Grayer on: 07/26/2020 05:06 PM   Modules accepted: Orders

## 2020-07-26 NOTE — Patient Instructions (Signed)
Medication Instructions:  Your physician recommends that you continue on your current medications as directed. Please refer to the Current Medication list given to you today.  *If you need a refill on your cardiac medications before your next appointment, please call your pharmacy*   Lab Work: Lab work to be done today--CBC and CMET If you have labs (blood work) drawn today and your tests are completely normal, you will receive your results only by: Marland Kitchen MyChart Message (if you have MyChart) OR . A paper copy in the mail If you have any lab test that is abnormal or we need to change your treatment, we will call you to review the results.   Testing/Procedures: none   Follow-Up: At [facility], you and your health needs are our priority.  As part of our continuing mission to provide you with exceptional heart care, we have created designated Provider Care Teams.  These Care Teams include your primary Cardiologist (physician) and Advanced Practice Providers (APPs -  Physician Assistants and Nurse Practitioners) who all work together to provide you with the care you need, when you need it.  We recommend signing up for the patient portal called "MyChart".  Sign up information is provided on this After Visit Summary.  MyChart is used to connect with patients for Virtual Visits (Telemedicine).  Patients are able to view lab/test results, encounter notes, upcoming appointments, etc.  Non-urgent messages can be sent to your provider as well.   To learn more about what you can do with MyChart, go to [email or web address].    Your next appointment:   12 month(s)  The format for your next appointment:   In Person  Provider:   You may see Larae Grooms, MD or one of the following Advanced Practice Providers on your designated Care Team:    Melina Copa, PA-C  Ermalinda Barrios, PA-C    Other Instructions

## 2020-07-27 ENCOUNTER — Other Ambulatory Visit: Payer: Medicare Other

## 2020-07-27 DIAGNOSIS — I5032 Chronic diastolic (congestive) heart failure: Secondary | ICD-10-CM | POA: Diagnosis not present

## 2020-07-27 DIAGNOSIS — I480 Paroxysmal atrial fibrillation: Secondary | ICD-10-CM | POA: Diagnosis not present

## 2020-07-27 DIAGNOSIS — I110 Hypertensive heart disease with heart failure: Secondary | ICD-10-CM | POA: Diagnosis not present

## 2020-07-27 NOTE — Addendum Note (Signed)
Addended by: Eulis Foster on: 07/27/2020 03:32 PM   Modules accepted: Orders

## 2020-07-28 LAB — COMPREHENSIVE METABOLIC PANEL
ALT: 21 IU/L (ref 0–44)
AST: 19 IU/L (ref 0–40)
Albumin/Globulin Ratio: 1.8 (ref 1.2–2.2)
Albumin: 4.3 g/dL (ref 3.7–4.7)
Alkaline Phosphatase: 133 IU/L — ABNORMAL HIGH (ref 44–121)
BUN/Creatinine Ratio: 14 (ref 10–24)
BUN: 15 mg/dL (ref 8–27)
Bilirubin Total: 0.3 mg/dL (ref 0.0–1.2)
CO2: 21 mmol/L (ref 20–29)
Calcium: 9.6 mg/dL (ref 8.6–10.2)
Chloride: 100 mmol/L (ref 96–106)
Creatinine, Ser: 1.08 mg/dL (ref 0.76–1.27)
Globulin, Total: 2.4 g/dL (ref 1.5–4.5)
Glucose: 200 mg/dL — ABNORMAL HIGH (ref 65–99)
Potassium: 4 mmol/L (ref 3.5–5.2)
Sodium: 142 mmol/L (ref 134–144)
Total Protein: 6.7 g/dL (ref 6.0–8.5)
eGFR: 70 mL/min/{1.73_m2} (ref >59)

## 2020-07-28 LAB — CBC
Hematocrit: 41.4 % (ref 37.5–51.0)
Hemoglobin: 13.8 g/dL (ref 13.0–17.7)
MCH: 26.5 pg — ABNORMAL LOW (ref 26.6–33.0)
MCHC: 33.3 g/dL (ref 31.5–35.7)
MCV: 80 fL (ref 79–97)
Platelets: 425 10*3/uL (ref 150–450)
RBC: 5.21 x10E6/uL (ref 4.14–5.80)
RDW: 15.2 % (ref 11.6–15.4)
WBC: 12.2 10*3/uL — ABNORMAL HIGH (ref 3.4–10.8)

## 2020-08-14 DIAGNOSIS — C220 Liver cell carcinoma: Secondary | ICD-10-CM | POA: Diagnosis not present

## 2020-08-15 ENCOUNTER — Ambulatory Visit (HOSPITAL_COMMUNITY)
Admission: RE | Admit: 2020-08-15 | Discharge: 2020-08-15 | Disposition: A | Discharge status: Home / Self Care | Payer: Medicare Other | Source: Ambulatory Visit | Attending: Interventional Radiology | Admitting: Interventional Radiology

## 2020-08-15 ENCOUNTER — Other Ambulatory Visit: Payer: Self-pay

## 2020-08-15 DIAGNOSIS — C220 Liver cell carcinoma: Secondary | ICD-10-CM | POA: Diagnosis not present

## 2020-08-15 DIAGNOSIS — N281 Cyst of kidney, acquired: Secondary | ICD-10-CM | POA: Diagnosis not present

## 2020-08-15 DIAGNOSIS — K449 Diaphragmatic hernia without obstruction or gangrene: Secondary | ICD-10-CM | POA: Diagnosis not present

## 2020-08-15 DIAGNOSIS — I77811 Abdominal aortic ectasia: Secondary | ICD-10-CM | POA: Diagnosis not present

## 2020-08-15 LAB — COMPLETE METABOLIC PANEL WITH GFR
AG Ratio: 1.7 (calc) (ref 1.0–2.5)
ALT: 21 U/L (ref 9–46)
AST: 17 U/L (ref 10–35)
Albumin: 4.2 g/dL (ref 3.6–5.1)
Alkaline phosphatase (APISO): 101 U/L (ref 35–144)
BUN: 12 mg/dL (ref 7–25)
CO2: 30 mmol/L (ref 20–32)
Calcium: 9.7 mg/dL (ref 8.6–10.3)
Chloride: 104 mmol/L (ref 98–110)
Creat: 1.04 mg/dL (ref 0.70–1.18)
GFR, Est African American: 79 mL/min/{1.73_m2} (ref >=60)
GFR, Est Non African American: 68 mL/min/{1.73_m2} (ref >=60)
Globulin: 2.5 g/dL (calc) (ref 1.9–3.7)
Glucose, Bld: 133 mg/dL (ref 65–139)
Potassium: 4.2 mmol/L (ref 3.5–5.3)
Sodium: 143 mmol/L (ref 135–146)
Total Bilirubin: 0.5 mg/dL (ref 0.2–1.2)
Total Protein: 6.7 g/dL (ref 6.1–8.1)

## 2020-08-15 LAB — AFP TUMOR MARKER: AFP-Tumor Marker: 1.5 ng/mL (ref <6.1)

## 2020-08-15 MED ORDER — GADOBUTROL 1 MMOL/ML IV SOLN
10.0000 mL | Freq: Once | INTRAVENOUS | Status: AC | PRN
  Administered 2020-08-15: 10 mL via INTRAVENOUS

## 2020-08-16 ENCOUNTER — Other Ambulatory Visit: Payer: Self-pay | Admitting: Interventional Cardiology

## 2020-08-17 MED ORDER — DILTIAZEM HCL ER COATED BEADS 240 MG PO CP24: 240.0000 mg | ORAL_CAPSULE | Freq: Every day | ORAL | 3 refills | Status: DC

## 2020-08-21 ENCOUNTER — Encounter: Payer: Self-pay | Admitting: *Deleted

## 2020-08-21 ENCOUNTER — Other Ambulatory Visit: Payer: Self-pay

## 2020-08-21 ENCOUNTER — Ambulatory Visit
Admission: RE | Admit: 2020-08-21 | Discharge: 2020-08-21 | Disposition: A | Discharge status: Home / Self Care | Payer: Medicare Other | Source: Ambulatory Visit | Attending: Interventional Radiology | Admitting: Interventional Radiology

## 2020-08-21 DIAGNOSIS — C220 Liver cell carcinoma: Secondary | ICD-10-CM | POA: Diagnosis not present

## 2020-08-21 HISTORY — PX: IR RADIOLOGIST EVAL & MGMT: IMG5224

## 2020-08-21 NOTE — Progress Notes (Signed)
Patient ID: Steve Jensen, male   DOB: May 29, 1940, 80 y.o.   MRN: [id number]         Chief Complaint: Post hepatic microwave ablation (11/24/2018)  Referring Physician(s): Burr Medico (Oncology) [name] (PCP)  History of Present Illness:  Steve F McCollumis a 80 y.o.malewith past medical history significant for type 2 diabetes, hypertension, hyperlipidemia, morbid obesity obstructive sleep apnea and prostate cancer who is seen in consultation via telemedicine following the acquisition of a postprocedural abdominal MRI performed 12/24/2019,following technically successful hepatic microwave ablation performed 11/24/2018 and transcatheter blandembolization performed 9/23/2019for biopsy-proven hepatocellular carcinoma.  He is currently without complaint. Specifically, no abdominal pain, fever or chills. No increased abdominal girth. No yellowing of the skin or eyes. No change in appetite or energy level.   Past Medical History:  Diagnosis Date  . Anemia   . Diabetes mellitus type II, controlled ([place])    with neuropathy  . Dysrhythmia    A-Fib. cardioversion done  . GERD (gastroesophageal reflux disease)   . Hyperlipidemia   . Hypertension   . Morbid obesity ([place])   . Neuromuscular disorder ([place])    feet  . OSA (obstructive sleep apnea)    On BiPAP at 15/11cm H2O  . Prostate cancer ([place])   . Shingles    on face Nov 2010  . Urinary incontinence     Past Surgical History:  Procedure Laterality Date  . CARDIOVERSION N/A 09/06/2013   Procedure: CARDIOVERSION;  Surgeon: [name], MD;  Location: [facility] ENDOSCOPY;  Service: Cardiovascular;  Laterality: N/A;  . EYE SURGERY     cataract surgery bilat; surgery to correct droopy eyelids   . IR ANGIOGRAM SELECTIVE EACH ADDITIONAL VESSEL  02/15/2018  . IR ANGIOGRAM SELECTIVE EACH ADDITIONAL VESSEL  02/15/2018  . IR ANGIOGRAM SELECTIVE EACH ADDITIONAL VESSEL  02/15/2018  . IR ANGIOGRAM SELECTIVE EACH ADDITIONAL VESSEL   02/15/2018  . IR ANGIOGRAM SELECTIVE EACH ADDITIONAL VESSEL  02/15/2018  . IR ANGIOGRAM VISCERAL SELECTIVE  02/15/2018  . IR ANGIOGRAM VISCERAL SELECTIVE  02/15/2018  . IR EMBO TUMOR ORGAN ISCHEMIA INFARCT INC GUIDE ROADMAPPING  02/15/2018  . IR RADIOLOGIST EVAL & MGMT  01/26/2018  . IR RADIOLOGIST EVAL & MGMT  03/17/2018  . IR RADIOLOGIST EVAL & MGMT  06/29/2018  . IR RADIOLOGIST EVAL & MGMT  07/27/2018  . IR RADIOLOGIST EVAL & MGMT  10/28/2018  . IR RADIOLOGIST EVAL & MGMT  12/14/2018  . IR RADIOLOGIST EVAL & MGMT  03/17/2019  . IR RADIOLOGIST EVAL & MGMT  07/14/2019  . IR RADIOLOGIST EVAL & MGMT  12/27/2019  . IR US GUIDE VASC ACCESS RIGHT  02/15/2018  . knee surgery bilat     . PROSTATE SURGERY    . RADIOLOGY WITH ANESTHESIA N/A 11/24/2018   Procedure: MICROWAVE THERMAL ABLATION LIVER;  Surgeon: Sandi Mariscal, MD;  Location: [facility] ORS;  Service: Anesthesiology;  Laterality: N/A;  . right shoulder rotator cuff surgery    . TONSILLECTOMY      Allergies: Lipitor [atorvastatin], Simvastatin, Zithromax [azithromycin], and Amlodipine  Medications: Prior to Admission medications   Medication Sig Start Date End Date Taking? Authorizing Provider  apixaban (ELIQUIS) 5 MG TABS tablet TAKE 1 TABLET(5 MG) BY MOUTH TWICE DAILY 11/15/18   [name], MD  CINNAMON PO Take 1,000 mg by mouth daily.    [provider]  diltiazem (CARTIA XT) 240 MG 24 hr capsule Take 1 capsule (240 mg total) by mouth daily. 08/17/20   [name],  MD  fish oil-omega-3 fatty acids 1000 MG capsule Take 2 g by mouth 2 (two) times daily.     [provider]  furosemide (LASIX) 40 MG tablet TAKE 1 TABLET BY MOUTH  TWICE DAILY AS NEEDED 07/04/20   [name], MD  ibuprofen (ADVIL,MOTRIN) 200 MG tablet Take 800 mg by mouth every 6 (six) hours as needed (PAIN). prn 06/30/13   [provider]  loratadine (CLARITIN) 10 MG tablet Take 10 mg by mouth daily as needed for allergies.     [provider]  metFORMIN (GLUCOPHAGE) 1000 MG tablet Take 1,000 mg by mouth 2 (two) times daily. 06/27/14   [provider]  metoprolol succinate (TOPROL-XL) 50 MG 24 hr tablet Take 50 mg by mouth daily.  08/10/13   [provider]  Multiple Vitamins-Minerals (MULTIVITAMIN WITH MINERALS) tablet Take 1 tablet by mouth daily.    [provider]  omeprazole (PRILOSEC) 20 MG capsule Take 20 mg by mouth daily.  11/29/12   [provider]  oxymetazoline (AFRIN) 0.05 % nasal spray Place 1 spray into both nostrils 2 (two) times daily as needed for congestion.    [provider]  potassium chloride SA (KLOR-CON) 20 MEQ tablet TAKE 1 TABLET BY MOUTH  DAILY AS NEEDED WHILE  TAKING FUROSEMIDE 05/29/20   [name], MD  pregabalin (LYRICA) 100 MG capsule Take 100 mg by mouth 2 (two) times a day.  09/14/18   [provider]  ramipril (ALTACE) 10 MG capsule Take 10 mg by mouth 2 (two) times daily. 01/11/13   [provider]  Semaglutide, 1 MG/DOSE, 2 MG/1.5ML SOPN Inject 1 mg into the skin once a week. Monday    [provider]  ZETIA 10 MG tablet Take 10 mg by mouth daily.  01/17/13   [provider]     Family History  Problem Relation Age of Onset  . Hypertension Brother   . Diabetes Brother   . Cancer Brother        bladder cancer  . Diabetes Brother   . CVA Brother   . Hypertension Brother   . Prostate cancer Brother   . Diabetes Brother   . Hypertension Brother   . Heart disease Brother        CABG  . Heart attack Brother   . Stroke Brother     Social History   Socioeconomic History  . Marital status: Widowed    Spouse name: Not on file  . Number of children: Not on file  . Years of education: Not on file  . Highest education level: Not on file  Occupational History  . Not on file  Tobacco Use  . Smoking status: Former Smoker    Packs/day: 1.00    Years: 15.00    Pack years: 15.00    Quit date: 05/27/1975     Years since quitting: 45.2  . Smokeless tobacco: Never Used  Vaping Use  . Vaping Use: Never used  Substance and Sexual Activity  . Alcohol use: No  . Drug use: No  . Sexual activity: Not on file  Other Topics Concern  . Not on file  Social History Narrative  . Not on file   Social Determinants of Health   Financial Resource Strain: Not on file  Food Insecurity: Not on file  Transportation Needs: Not on file  Physical Activity: Not on file  Stress: Not on file  Social Connections: Not on file  ECOG Status: 1 - Symptomatic but completely ambulatory  Review of Systems  Review of Systems: A 12 point ROS discussed and pertinent positives are indicated in the HPI above.  All other systems are negative.  Physical Exam No direct physical exam was performed (except for noted visual exam findings with Video Visits).   Vital Signs: There were no vitals taken for this visit.  Imaging:  Selected images from the following examinations were reviewed in detail: Abdominal MRI-08/15/2020; 7/38/2021; 07/10/2019; 03/16/2019 Intraprocedural images during CT-guided hepatic microwave ablation - 11/24/2018  Personal review of most recent surveillance abdominal MRI performed 08/15/2020 continue to demonstrate a technically excellent result without evidence of residual or locally recurrent disease.  Additionally, there are no additional discrete areas of enhancement to suggest additional sites of hepatocellular carcinoma.  MR ABDOMEN WWO CONTRAST  Result Date: 08/16/2020 CLINICAL DATA:  Hepatocellular carcinoma, history of ablation for hepatocellular carcinoma in a 80 year old gentleman. EXAM: MRI ABDOMEN WITHOUT AND WITH CONTRAST TECHNIQUE: Multiplanar multisequence MR imaging of the abdomen was performed both before and after the administration of intravenous contrast. CONTRAST:  15mL GADAVIST GADOBUTROL 1 MMOL/ML IV SOLN COMPARISON:  Examination from December 23, 2019 FINDINGS: Lower chest:  Incidental imaging of the lung bases with limited assessment on MRI. Minimal RIGHT lower lobe atelectasis and or scarring without change since previous imaging. Hepatobiliary: Mild hepatic steatosis. Nonenhancing area over the dome of the RIGHT hemi liver may represent susceptibility artifact or lipoma along the capsule. This is unchanged dating back to October of 2020 Observation 1: Ablation zone in the RIGHT hemi liver measures approximately 4.8 x 2.5 cm as compared to 5.0 x 3.2 cm. No enhancement in the ablation zone to suggest disease recurrence. LR TR nonviable. Observation 2: Subtle focus of enhancement in the cephalad aspect of the lateral segment LEFT hepatic lobe not seen on today's study. There is some motion artifact in this location. There is no lesion with sign of washout on more delayed phase. Sludge in the dependent gallbladder. No pericholecystic stranding. No biliary duct distension. Pancreas: Pancreas with normal intrinsic T1 signal. No sign of inflammation. No visible lesion. Spleen:  Normal spleen. Adrenals/Urinary Tract: Stable 2.5 cm RIGHT adrenal lesion that shows signal loss on out of phase imaging compatible with adrenal adenoma. Normal LEFT adrenal gland. Symmetric renal enhancement. Small LEFT renal and RIGHT renal cysts. No hydronephrosis. Stomach/Bowel: Small hiatal hernia. No acute gastrointestinal process to the extent evaluated on MRI Vascular/Lymphatic: Smooth contour the IVC. Mild ectasia of the infrarenal abdominal aorta with similar appearance. No adenopathy in the upper abdomen or retroperitoneum. Other:  No ascites. Musculoskeletal: No suspicious bone lesions identified. IMPRESSION: 1. Ablation zone in the RIGHT hemi liver is slightly decreased in size dating back to October of 2020, no enhancement in the ablation zone to suggest disease recurrence. LR TR nonviable. 2. Subtle focus of enhancement the cephalad aspect of the lateral segment LEFT hepatic lobe not seen on today's  study. Attention to this area on subsequent imaging is suggested. 3. Stable RIGHT adrenal adenoma. 4. Mild hepatic steatosis. 5. Small hiatal hernia. Electronically Signed   By: Zetta Bills M.D.   On: 08/16/2020 13:41    Labs:  CBC: Recent Labs    12/09/19 1554 07/27/20 1532  WBC 12.4* 12.2*  HGB 13.0* 13.8  HCT 39.7 41.4  PLT 430* 425    COAGS: Recent Labs    12/09/19 1554  INR 1.0    BMP: Recent Labs    12/09/19 1554 07/27/20  1532 08/14/20 0000  NA 141 142 143  K 3.9 4.0 4.2  CL 103 100 104  CO2 26 21 30   GLUCOSE 198* 200* 133  BUN 14 15 12   CALCIUM 9.3 9.6 9.7  CREATININE 1.02 1.08 1.04  GFRNONAA 71  --  68  GFRAA 82  --  79    LIVER FUNCTION TESTS: Recent Labs    12/09/19 1554 07/27/20 1532 08/14/20 0000  BILITOT 0.7 0.3 0.5  AST 10 19 17   ALT 13 21 21   ALKPHOS  --  133*  --   PROT 6.5 6.7 6.7  ALBUMIN  --  4.3  --     TUMOR MARKERS: Recent Labs    12/09/19 1554 08/14/20 0000  AFPTM 1.2 1.5    Assessment and Plan:  Laronn F McCollumis a 80 y.o.malewith past medical history significant for type 2 diabetes, hypertension, hyperlipidemia, morbid obesity obstructive sleep apnea and prostate cancer who is seen in consultation via telemedicine following the acquisition of a postprocedural abdominal MRI performed yesterday, 03/16/2019,following technically successful hepatic microwave ablation performed 11/24/2018 and transcatheter blandembolization performed 9/23/2019for biopsy-proven hepatocellular carcinoma.  Selected images from the following examinations were reviewed in detail: Abdominal MRI-08/15/2020; 7/38/2021; 07/10/2019; 03/16/2019 Intraprocedural images during CT-guided hepatic microwave ablation - 11/24/2018  Personal review of most recent surveillance abdominal MRI performed 08/15/2020 continue to demonstrate a technically excellent result without evidence of residual or locally recurrent disease.  Additionally, there are no additional  discrete areas of enhancement to suggest additional sites of hepatocellular carcinoma.  Specifically, the previously questioned 1.3 cm lesion within the dome of the left lobe of the liver is not demonstrated on this examination.  This finding is supported by persistent normalization of the AFP level (measuring 1.5 on 08/14/2020).  Additionally, the patient's LFTs remain normal.  This examination now documents 20 months of stability.  As such, we will obtain the next surveillance abdominal MRI in 6 months (late September 2022).  Plan: -Telemedicine consultation following acquisition of next surveillance abdominal MRI in 6 months (late September 2022) and AFP level. - Note, once 3 years of stability has been established we will likely then pursue yearly surveillance abdominal MRI given his concern for development of new/additional sites of hepatocellular carcinoma at the discretion of Dr. Burr Medico.  Note, the patient again reports having had another excellent experience at [facility] for his abdominal MRI and we will continue to send him there for his scans.  Thank you for this interesting consult.  I greatly enjoyed meeting Steve Jensen and look forward to participating in their care.  A copy of this report was sent to the requesting provider on this date.  Electronically Signed: Sandi Mariscal 08/21/2020, 1:45 PM   I spent a total of 5 Minutes in remote  clinical consultation, greater than 50% of which was counseling/coordinating care for post hepatic microwave ablation.    Visit type: Audio only (telephone). Audio (no video) only due to patient's lack of internet/smartphone capability. Alternative for in-person consultation at [facility], [address], [place], [place]. This visit type was conducted due to national recommendations for restrictions regarding the COVID-19 Pandemic (e.g. social distancing).  This format is felt to be most appropriate for this patient at this time.  All  issues noted in this document were discussed and addressed.

## 2020-09-27 DIAGNOSIS — M1711 Unilateral primary osteoarthritis, right knee: Secondary | ICD-10-CM | POA: Diagnosis not present

## 2020-10-04 DIAGNOSIS — G4733 Obstructive sleep apnea (adult) (pediatric): Secondary | ICD-10-CM | POA: Diagnosis not present

## 2020-11-05 DIAGNOSIS — G4733 Obstructive sleep apnea (adult) (pediatric): Secondary | ICD-10-CM | POA: Diagnosis not present

## 2020-12-26 ENCOUNTER — Other Ambulatory Visit: Payer: Self-pay | Admitting: Family Medicine

## 2020-12-26 DIAGNOSIS — E1140 Type 2 diabetes mellitus with diabetic neuropathy, unspecified: Secondary | ICD-10-CM | POA: Diagnosis not present

## 2020-12-26 DIAGNOSIS — C220 Liver cell carcinoma: Secondary | ICD-10-CM | POA: Diagnosis not present

## 2020-12-26 DIAGNOSIS — I714 Abdominal aortic aneurysm, without rupture, unspecified: Secondary | ICD-10-CM

## 2020-12-26 DIAGNOSIS — I482 Chronic atrial fibrillation, unspecified: Secondary | ICD-10-CM | POA: Diagnosis not present

## 2020-12-26 DIAGNOSIS — K7460 Unspecified cirrhosis of liver: Secondary | ICD-10-CM | POA: Diagnosis not present

## 2020-12-26 DIAGNOSIS — Z0000 Encounter for general adult medical examination without abnormal findings: Secondary | ICD-10-CM | POA: Diagnosis not present

## 2020-12-26 DIAGNOSIS — E782 Mixed hyperlipidemia: Secondary | ICD-10-CM | POA: Diagnosis not present

## 2020-12-26 DIAGNOSIS — D6869 Other thrombophilia: Secondary | ICD-10-CM | POA: Diagnosis not present

## 2020-12-26 DIAGNOSIS — R809 Proteinuria, unspecified: Secondary | ICD-10-CM | POA: Diagnosis not present

## 2020-12-26 DIAGNOSIS — I700 Atherosclerosis of aorta: Secondary | ICD-10-CM | POA: Diagnosis not present

## 2020-12-26 DIAGNOSIS — I10 Essential (primary) hypertension: Secondary | ICD-10-CM | POA: Diagnosis not present

## 2021-01-03 ENCOUNTER — Other Ambulatory Visit: Payer: Self-pay

## 2021-01-03 ENCOUNTER — Other Ambulatory Visit: Payer: Self-pay | Admitting: Interventional Radiology

## 2021-01-03 DIAGNOSIS — C220 Liver cell carcinoma: Secondary | ICD-10-CM

## 2021-01-07 ENCOUNTER — Other Ambulatory Visit: Payer: Self-pay

## 2021-01-07 ENCOUNTER — Ambulatory Visit
Admission: RE | Admit: 2021-01-07 | Discharge: 2021-01-07 | Disposition: A | Discharge status: Home / Self Care | Payer: Medicare Other | Source: Ambulatory Visit | Attending: Family Medicine | Admitting: Family Medicine

## 2021-01-07 DIAGNOSIS — I714 Abdominal aortic aneurysm, without rupture, unspecified: Secondary | ICD-10-CM

## 2021-02-14 DIAGNOSIS — C220 Liver cell carcinoma: Secondary | ICD-10-CM | POA: Diagnosis not present

## 2021-02-15 LAB — COMPLETE METABOLIC PANEL WITH GFR
AG Ratio: 1.8 (calc) (ref 1.0–2.5)
ALT: 22 U/L (ref 9–46)
AST: 19 U/L (ref 10–35)
Albumin: 4.3 g/dL (ref 3.6–5.1)
Alkaline phosphatase (APISO): 102 U/L (ref 35–144)
BUN: 20 mg/dL (ref 7–25)
CO2: 28 mmol/L (ref 20–32)
Calcium: 9.5 mg/dL (ref 8.6–10.3)
Chloride: 101 mmol/L (ref 98–110)
Creat: 1.23 mg/dL (ref 0.70–1.28)
Globulin: 2.4 g/dL (calc) (ref 1.9–3.7)
Glucose, Bld: 158 mg/dL — ABNORMAL HIGH (ref 65–99)
Potassium: 3.9 mmol/L (ref 3.5–5.3)
Sodium: 143 mmol/L (ref 135–146)
Total Bilirubin: 0.5 mg/dL (ref 0.2–1.2)
Total Protein: 6.7 g/dL (ref 6.1–8.1)
eGFR: 60 mL/min/{1.73_m2} (ref >=60)

## 2021-02-15 LAB — AFP TUMOR MARKER: AFP-Tumor Marker: 1.6 ng/mL (ref <6.1)

## 2021-02-18 ENCOUNTER — Other Ambulatory Visit: Payer: Medicare Other | Admitting: *Deleted

## 2021-02-18 ENCOUNTER — Telehealth: Payer: Self-pay | Admitting: Interventional Cardiology

## 2021-02-18 ENCOUNTER — Other Ambulatory Visit: Payer: Self-pay

## 2021-02-18 DIAGNOSIS — I480 Paroxysmal atrial fibrillation: Secondary | ICD-10-CM

## 2021-02-18 DIAGNOSIS — R0600 Dyspnea, unspecified: Secondary | ICD-10-CM

## 2021-02-18 DIAGNOSIS — R002 Palpitations: Secondary | ICD-10-CM

## 2021-02-18 DIAGNOSIS — R0609 Other forms of dyspnea: Secondary | ICD-10-CM

## 2021-02-18 DIAGNOSIS — I5032 Chronic diastolic (congestive) heart failure: Secondary | ICD-10-CM | POA: Diagnosis not present

## 2021-02-18 NOTE — Telephone Encounter (Signed)
I spoke with patient.  He first starting noticing skipped beats on 9/17.  Also developed shortness of breath and swelling in his legs at that time.  Reports weight gain of 6-7 lbs at that time.  He has BP cuff but has not checked BP.  Checks pulse manually.  Running around 90. Was 105 one time.  He is having shortness of breath when walking around his house.  His normal dose of lasix is 40 mg twice daily so he increased to 80 mg twice daily for a week starting on 9/17. After a week he went back to 40 mg twice daily.  His weight came down 5-6 lbs but shortness of breath did not improve.  He also feels tired.  No chest pain, no cough. Did not weigh today but states his weight is 3-4 lbs above normal the last few days.  He was not having any shortness of breath, palpitations or swelling prior to 9/17.  Is taking Eliquis, Diltiazem and Metoprolol as listed.  Will forward to Dr Irish Lack for review/recommendations.

## 2021-02-18 NOTE — Telephone Encounter (Signed)
  STAT if HR is under 50 or over 120 (normal HR is 60-100 beats per minute)  What is your heart rate? 89-90 bpm  Do you have a log of your heart rate readings (document readings)? SOB   Do you have any other symptoms? Pt said he can feel his pulse fluctuating. He said he gets sob with light exertion. He denied cp and dizziness

## 2021-02-18 NOTE — Telephone Encounter (Signed)
BMet and BNP for shortness of breath.  Echo as well.

## 2021-02-18 NOTE — Telephone Encounter (Signed)
Patient notified.  He will come in this afternoon for lab work.  He is aware he will be called with echo appointment. Patient reports he checked BP after talking with me earlier today and it was 134/112.  He takes AM medications around noon and had not taken them prior to checking BP.  He took medications after checking BP.  I told him BP should come down after taking medications.  I asked him to check BP twice daily and let us know if it remains elevated.

## 2021-02-19 LAB — BASIC METABOLIC PANEL
BUN/Creatinine Ratio: 16 (ref 10–24)
BUN: 19 mg/dL (ref 8–27)
CO2: 22 mmol/L (ref 20–29)
Calcium: 9.5 mg/dL (ref 8.6–10.2)
Chloride: 101 mmol/L (ref 96–106)
Creatinine, Ser: 1.22 mg/dL (ref 0.76–1.27)
Glucose: 174 mg/dL — ABNORMAL HIGH (ref 70–99)
Potassium: 4.1 mmol/L (ref 3.5–5.2)
Sodium: 144 mmol/L (ref 134–144)
eGFR: 61 mL/min/{1.73_m2} (ref >59)

## 2021-02-19 LAB — PRO B NATRIURETIC PEPTIDE: NT-Pro BNP: 99 pg/mL (ref 0–486)

## 2021-02-21 ENCOUNTER — Ambulatory Visit (HOSPITAL_BASED_OUTPATIENT_CLINIC_OR_DEPARTMENT_OTHER): Payer: Medicare Other

## 2021-02-21 ENCOUNTER — Other Ambulatory Visit: Payer: Self-pay

## 2021-02-21 DIAGNOSIS — R0600 Dyspnea, unspecified: Secondary | ICD-10-CM | POA: Diagnosis not present

## 2021-02-21 DIAGNOSIS — I480 Paroxysmal atrial fibrillation: Secondary | ICD-10-CM

## 2021-02-21 DIAGNOSIS — C220 Liver cell carcinoma: Secondary | ICD-10-CM | POA: Diagnosis not present

## 2021-02-21 DIAGNOSIS — R0609 Other forms of dyspnea: Secondary | ICD-10-CM

## 2021-02-21 DIAGNOSIS — I5032 Chronic diastolic (congestive) heart failure: Secondary | ICD-10-CM | POA: Diagnosis not present

## 2021-02-21 LAB — ECHOCARDIOGRAM COMPLETE
Area-P 1/2: 3.85 cm2
S' Lateral: 3.7 cm

## 2021-02-21 MED ORDER — PERFLUTREN LIPID MICROSPHERE
1.0000 mL | INTRAVENOUS | Status: AC | PRN
  Administered 2021-02-21: 1 mL via INTRAVENOUS

## 2021-02-22 ENCOUNTER — Ambulatory Visit (HOSPITAL_COMMUNITY)
Admission: RE | Admit: 2021-02-22 | Discharge: 2021-02-22 | Disposition: A | Discharge status: Home / Self Care | Payer: Medicare Other | Source: Ambulatory Visit | Attending: Interventional Radiology | Admitting: Interventional Radiology

## 2021-02-22 DIAGNOSIS — I480 Paroxysmal atrial fibrillation: Secondary | ICD-10-CM | POA: Insufficient documentation

## 2021-02-22 DIAGNOSIS — C220 Liver cell carcinoma: Secondary | ICD-10-CM | POA: Diagnosis not present

## 2021-02-22 DIAGNOSIS — R0600 Dyspnea, unspecified: Secondary | ICD-10-CM | POA: Diagnosis not present

## 2021-02-22 DIAGNOSIS — I5032 Chronic diastolic (congestive) heart failure: Secondary | ICD-10-CM | POA: Insufficient documentation

## 2021-02-22 DIAGNOSIS — I714 Abdominal aortic aneurysm, without rupture: Secondary | ICD-10-CM | POA: Diagnosis not present

## 2021-02-22 DIAGNOSIS — Z8505 Personal history of malignant neoplasm of liver: Secondary | ICD-10-CM | POA: Diagnosis not present

## 2021-02-22 DIAGNOSIS — K760 Fatty (change of) liver, not elsewhere classified: Secondary | ICD-10-CM | POA: Diagnosis not present

## 2021-02-22 DIAGNOSIS — I700 Atherosclerosis of aorta: Secondary | ICD-10-CM | POA: Diagnosis not present

## 2021-02-22 MED ORDER — GADOBUTROL 1 MMOL/ML IV SOLN
9.7000 mL | Freq: Once | INTRAVENOUS | Status: AC | PRN
  Administered 2021-02-22: 9.7 mL via INTRAVENOUS

## 2021-02-22 NOTE — Telephone Encounter (Signed)
Left message telling patient that Dr. Hassell Done nurse will call him back on Monday or he can call us back.

## 2021-02-22 NOTE — Telephone Encounter (Signed)
[name], MD  02/22/2021 11:07 AM EDT     Normal LV/RV function. Normal valvular function. No cardiac cause of SHOB identified     [name], MD  02/19/2021 10:48 PM EDT     Renal function and BNP normal    Patient aware of results. Patient wants to know what the next step is. Patient stated he has some appointments this afternoon and it is okay to get back with him on Monday. Will forward to Dr. Irish Lack and his nurse.

## 2021-02-22 NOTE — Telephone Encounter (Signed)
Patient calling back.   °

## 2021-02-25 NOTE — Telephone Encounter (Signed)
Can we add on CBC to blood from yesterday.  Otherwise, can get CXR.  If that is ok, would check with PMD.  Heart seems to be functioning well.     Report to [place] if he is having any chest pain as well.   JV   I checked with lab and unable to add CBC to this lab work. I placed call to patient and left message to call office

## 2021-02-27 ENCOUNTER — Telehealth: Payer: Self-pay | Admitting: Interventional Cardiology

## 2021-02-27 NOTE — Telephone Encounter (Signed)
Echo and lab results reviewed with patient.  He will have chest X ray done tomorrow at [facility] and stop by our office tomorrow for CBC. He continues to be short of breath with exertion such as walking around the house and making his bed.  He is asking if palpitations could be contributing to this and what should be done for palpitations. He is not having any palpitations at this time but did have some yesteday

## 2021-02-27 NOTE — Addendum Note (Signed)
Addended by: Thompson Grayer on: 02/27/2021 04:22 PM   Modules accepted: Orders

## 2021-02-27 NOTE — Telephone Encounter (Signed)
Follow Up:     Patient is returning [name]'s call, concerning his Echo resulots.

## 2021-02-27 NOTE — Telephone Encounter (Signed)
See other phone note from today.

## 2021-02-28 ENCOUNTER — Ambulatory Visit
Admission: RE | Admit: 2021-02-28 | Discharge: 2021-02-28 | Disposition: A | Discharge status: Home / Self Care | Payer: Medicare Other | Source: Ambulatory Visit | Attending: Interventional Radiology | Admitting: Interventional Radiology

## 2021-02-28 ENCOUNTER — Encounter: Payer: Self-pay | Admitting: *Deleted

## 2021-02-28 ENCOUNTER — Other Ambulatory Visit: Payer: Medicare Other

## 2021-02-28 ENCOUNTER — Ambulatory Visit
Admission: RE | Admit: 2021-02-28 | Discharge: 2021-02-28 | Disposition: A | Discharge status: Home / Self Care | Payer: Medicare Other | Source: Ambulatory Visit | Attending: Interventional Cardiology | Admitting: Interventional Cardiology

## 2021-02-28 ENCOUNTER — Other Ambulatory Visit: Payer: Self-pay

## 2021-02-28 DIAGNOSIS — R0602 Shortness of breath: Secondary | ICD-10-CM | POA: Diagnosis not present

## 2021-02-28 DIAGNOSIS — C220 Liver cell carcinoma: Secondary | ICD-10-CM

## 2021-02-28 DIAGNOSIS — I5032 Chronic diastolic (congestive) heart failure: Secondary | ICD-10-CM | POA: Diagnosis not present

## 2021-02-28 DIAGNOSIS — R0609 Other forms of dyspnea: Secondary | ICD-10-CM | POA: Diagnosis not present

## 2021-02-28 DIAGNOSIS — I480 Paroxysmal atrial fibrillation: Secondary | ICD-10-CM

## 2021-02-28 HISTORY — PX: IR RADIOLOGIST EVAL & MGMT: IMG5224

## 2021-02-28 NOTE — Progress Notes (Signed)
Patient ID: Steve Jensen, male   DOB: [birth year], 80 y.o.   MRN: [id number]        Chief Complaint: Post hepatic microwave ablation (11/24/2018)   Referring Physician(s): Burr Medico (Oncology) [name] (PCP)   History of Present Illness:   Steve Jensen is a 80 y.o. male with past medical history significant for type 2 diabetes, hypertension, hyperlipidemia, morbid obesity obstructive sleep apnea and prostate cancer who is seen in consultation via telemedicine following the acquisition of a postprocedural abdominal MRI performed 12/24/2019, following technically successful hepatic microwave ablation performed 11/24/2018 and transcatheter bland embolization performed 02/15/2018 for biopsy-proven hepatocellular carcinoma.  He is seen today in telemedicine consultation following acquisition of surveillance abdominal MRI performed 02/22/2021 as well as AFP level obtained at 02/14/2021.   He is currently without complaint.  Specifically, no abdominal pain, fever or chills.  No increased abdominal girth.  No yellowing of the skin or eyes.  No change in appetite or energy level.    Past Medical History:  Diagnosis Date   Anemia    Diabetes mellitus type II, controlled ([place])    with neuropathy   Dysrhythmia    A-Fib. cardioversion done   GERD (gastroesophageal reflux disease)    Hyperlipidemia    Hypertension    Morbid obesity ([place])    Neuromuscular disorder ([place])    feet   OSA (obstructive sleep apnea)    On BiPAP at 15/11cm H2O   Prostate cancer ([place])    Shingles    on face Nov 2010   Urinary incontinence     Past Surgical History:  Procedure Laterality Date   CARDIOVERSION N/A 09/06/2013   Procedure: CARDIOVERSION;  Surgeon: [name], MD;  Location: [facility];  Service: Cardiovascular;  Laterality: N/A;   EYE SURGERY     cataract surgery bilat; surgery to correct droopy eyelids    IR ANGIOGRAM SELECTIVE EACH ADDITIONAL VESSEL  02/15/2018   IR ANGIOGRAM SELECTIVE EACH  ADDITIONAL VESSEL  02/15/2018   IR ANGIOGRAM SELECTIVE EACH ADDITIONAL VESSEL  02/15/2018   IR ANGIOGRAM SELECTIVE EACH ADDITIONAL VESSEL  02/15/2018   IR ANGIOGRAM SELECTIVE EACH ADDITIONAL VESSEL  02/15/2018   IR ANGIOGRAM VISCERAL SELECTIVE  02/15/2018   IR ANGIOGRAM VISCERAL SELECTIVE  02/15/2018   IR EMBO TUMOR ORGAN ISCHEMIA INFARCT INC GUIDE ROADMAPPING  02/15/2018   IR RADIOLOGIST EVAL & MGMT  01/26/2018   IR RADIOLOGIST EVAL & MGMT  03/17/2018   IR RADIOLOGIST EVAL & MGMT  06/29/2018   IR RADIOLOGIST EVAL & MGMT  07/27/2018   IR RADIOLOGIST EVAL & MGMT  10/28/2018   IR RADIOLOGIST EVAL & MGMT  12/14/2018   IR RADIOLOGIST EVAL & MGMT  03/17/2019   IR RADIOLOGIST EVAL & MGMT  07/14/2019   IR RADIOLOGIST EVAL & MGMT  12/27/2019   IR RADIOLOGIST EVAL & MGMT  08/21/2020   IR US GUIDE VASC ACCESS RIGHT  02/15/2018   knee surgery bilat      PROSTATE SURGERY     RADIOLOGY WITH ANESTHESIA N/A 11/24/2018   Procedure: MICROWAVE THERMAL ABLATION LIVER;  Surgeon: Sandi Mariscal, MD;  Location: [facility];  Service: Anesthesiology;  Laterality: N/A;   right shoulder rotator cuff surgery     TONSILLECTOMY      Allergies: Lipitor [atorvastatin], Simvastatin, Zithromax [azithromycin], and Amlodipine  Medications: Prior to Admission medications   Medication Sig Start Date End Date Taking? Authorizing Provider  apixaban (ELIQUIS) 5 MG TABS tablet TAKE 1 TABLET(5 MG) BY MOUTH TWICE  DAILY 11/15/18   [name], MD  CINNAMON PO Take 1,000 mg by mouth daily.    [provider]  diltiazem (CARTIA XT) 240 MG 24 hr capsule Take 1 capsule (240 mg total) by mouth daily. 08/17/20   [name], MD  fish oil-omega-3 fatty acids 1000 MG capsule Take 2 g by mouth 2 (two) times daily.     [provider]  furosemide (LASIX) 40 MG tablet TAKE 1 TABLET BY MOUTH  TWICE DAILY AS NEEDED 07/04/20   [name], MD  ibuprofen (ADVIL,MOTRIN) 200 MG tablet Take 800 mg by mouth every 6 (six) hours as  needed (PAIN). prn 06/30/13   [provider]  loratadine (CLARITIN) 10 MG tablet Take 10 mg by mouth daily as needed for allergies.     [provider]  metFORMIN (GLUCOPHAGE) 1000 MG tablet Take 1,000 mg by mouth 2 (two) times daily. 06/27/14   [provider]  metoprolol succinate (TOPROL-XL) 50 MG 24 hr tablet Take 50 mg by mouth daily.  08/10/13   [provider]  Multiple Vitamins-Minerals (MULTIVITAMIN WITH MINERALS) tablet Take 1 tablet by mouth daily.    [provider]  omeprazole (PRILOSEC) 20 MG capsule Take 20 mg by mouth daily.  11/29/12   [provider]  oxymetazoline (AFRIN) 0.05 % nasal spray Place 1 spray into both nostrils 2 (two) times daily as needed for congestion.    [provider]  potassium chloride SA (KLOR-CON) 20 MEQ tablet TAKE 1 TABLET BY MOUTH  DAILY AS NEEDED WHILE  TAKING FUROSEMIDE 05/29/20   [name], MD  pregabalin (LYRICA) 100 MG capsule Take 100 mg by mouth 2 (two) times a day.  09/14/18   [provider]  ramipril (ALTACE) 10 MG capsule Take 10 mg by mouth 2 (two) times daily. 01/11/13   [provider]  Semaglutide, 1 MG/DOSE, 2 MG/1.5ML SOPN Inject 1 mg into the skin once a week. Monday    [provider]  ZETIA 10 MG tablet Take 10 mg by mouth daily.  01/17/13   [provider]     Family History  Problem Relation Age of Onset   Hypertension Brother    Diabetes Brother    Cancer Brother        bladder cancer   Diabetes Brother    CVA Brother    Hypertension Brother    Prostate cancer Brother    Diabetes Brother    Hypertension Brother    Heart disease Brother        CABG   Heart attack Brother    Stroke Brother     Social History   Socioeconomic History   Marital status: Widowed    Spouse name: Not on file   Number of children: Not on file   Years of education: Not on file   Highest education level: Not on file  Occupational History    Not on file  Tobacco Use   Smoking status: Former    Packs/day: 1.00    Years: 15.00    Pack years: 15.00    Types: Cigarettes    Quit date: 05/27/1975    Years since quitting: 45.7   Smokeless tobacco: Never  Vaping Use   Vaping Use: Never used  Substance and Sexual Activity   Alcohol use: No   Drug use: No   Sexual activity: Not on file  Other Topics Concern   Not on file  Social History  Narrative   Not on file   Social Determinants of Health   Financial Resource Strain: Not on file  Food Insecurity: Not on file  Transportation Needs: Not on file  Physical Activity: Not on file  Stress: Not on file  Social Connections: Not on file    ECOG Status: 1 - Symptomatic but completely ambulatory  Review of Systems  Review of Systems: A 12 point ROS discussed and pertinent positives are indicated in the HPI above.  All other systems are negative.  Physical Exam No direct physical exam was performed (except for noted visual exam findings with Video Visits).   Vital Signs: There were no vitals taken for this visit.  Imaging:  Abdominal MRI - 02/22/2021 - questionable area of nodular enhancement involving the lateral aspect of the ablation cavity (image 96, series 901), not definitively seen on previous examinations though admittedly, examinations are degraded due to patient's body habitus.    The ablation cavity has reduced in size compared to the 12/23/2019 examination, presently measuring 4.7 x 2.5 cm, previously, 5.0 x 3.2 cm.  No additional areas of abnormal enhancement are identified to suggest development of an additional area of [place].   MR ABDOMEN WWO CONTRAST  Result Date: 02/25/2021 CLINICAL DATA:  A 80 year old male with reported history of hepatocellular carcinoma. EXAM: MRI ABDOMEN WITHOUT AND WITH CONTRAST TECHNIQUE: Multiplanar multisequence MR imaging of the abdomen was performed both before and after the administration of intravenous contrast. CONTRAST:  9.21mL  GADAVIST GADOBUTROL 1 MMOL/ML IV SOLN COMPARISON:  MR abdomen from March of 2022 and July of 2021. FINDINGS: Lower chest: Incidental imaging of the lung bases with patchy areas of airspace disease, likely atelectasis. Hepatobiliary: Hepatic steatosis. Ovoid area of post ablation change in the RIGHT hemi liver (image 96/92) area with heterogeneous intrinsic T1 hyperintensity. Nodular area of enhancement along the posterior margin (image 96/901) 18 x 10 mm. Area displays signs of washout. Total size of the ablation zone on the previous study 4.8 x 2.5 cm. No new lesion. Portal vein is patent. Hepatic veins are patent. Cyst in the superior RIGHT hemi liver. Pancreas:  Normal, without mass, inflammation or ductal dilatation. Spleen:  Normal spleen. Adrenals/Urinary Tract: Stable RIGHT adrenal nodule 2.7 cm. Symmetric renal enhancement. Normal LEFT adrenal gland. No suspicious renal lesion, hydronephrosis or perinephric stranding. Stomach/Bowel: Unremarkable to the extent evaluated on abdominal MRI. Vascular/Lymphatic: Atherosclerotic plaque of the abdominal aorta. Stable 2.7 cm infrarenal abdominal aortic aneurysm. There is no gastrohepatic or hepatoduodenal ligament lymphadenopathy. No retroperitoneal or mesenteric lymphadenopathy. Other:  No ascites. Musculoskeletal: No suspicious bone lesions identified. IMPRESSION: Ovoid area of post ablation change in the RIGHT hemi liver. Nodular area of enhancement along the posterior margin. LR TR viable. No new lesion. Hepatic steatosis. Stable RIGHT adrenal adenoma. 2.7 cm infrarenal abdominal aortic aneurysm. 2.7 cm infrarenal abdominal aortic aneurysm. Recommend follow-up every 5 years. Reference: J Am Coll Radiol 4315;40:086-761. Aortic Atherosclerosis (ICD10-I70.0). Electronically Signed   By: Zetta Bills M.D.   On: 02/25/2021 09:02   ECHOCARDIOGRAM COMPLETE  Result Date: 02/21/2021    ECHOCARDIOGRAM REPORT   Patient Name:   Steve Jensen Date of Exam: 02/21/2021  Medical Rec #:  [id number]      Height:       71.0 in Accession #:    [id number]     Weight:       306.4 lb Date of Birth:  11-18-1940     BSA:  2.528 m Patient Age:    89 years       BP:           136/62 mmHg Patient Gender: M              HR:           78 bpm. Exam Location:  [address] Procedure: 2D Echo, Cardiac Doppler, Color Doppler and Intracardiac            Opacification Agent Indications:    I48 Atrial fibrillation  History:        Patient has prior history of Echocardiogram examinations, most                 recent 08/18/2013. CHF, Arrythmias:Atrial Fibrillation; Risk                 Factors:Hypertension, Sleep Apnea, Diabetes, Dyslipidemia,                 Former Smoker and Morbid obesity. Prostate cancer.  Sonographer:    Basilia Jumbo BS, RDCS Referring Phys: [phone] [name]  Sonographer Comments: Technically difficult study due to poor echo windows and suboptimal apical window. Image acquisition challenging due to patient body habitus. IMPRESSIONS  1. Left ventricular ejection fraction, by estimation, is 55 to 60%. The left ventricle has normal function. The left ventricle has no regional wall motion abnormalities. Left ventricular diastolic parameters are consistent with Grade I diastolic dysfunction (impaired relaxation).  2. Right ventricular systolic function is normal. The right ventricular size is normal.  3. The mitral valve is normal in structure. No evidence of mitral valve regurgitation. No evidence of mitral stenosis.  4. The aortic valve is normal in structure. Aortic valve regurgitation is mild. Mild aortic valve sclerosis is present, with no evidence of aortic valve stenosis.  5. The inferior vena cava is normal in size with greater than 50% respiratory variability, suggesting right atrial pressure of 3 mmHg. Comparison(s): 08/18/13 EF 55-60%. FINDINGS  Left Ventricle: Left ventricular ejection fraction, by estimation, is 55 to 60%. The left ventricle has normal function.  The left ventricle has no regional wall motion abnormalities. Definity contrast agent was given IV to delineate the left ventricular  endocardial borders. The left ventricular internal cavity size was normal in size. There is no left ventricular hypertrophy. Left ventricular diastolic parameters are consistent with Grade I diastolic dysfunction (impaired relaxation). Right Ventricle: The right ventricular size is normal. No increase in right ventricular wall thickness. Right ventricular systolic function is normal. Left Atrium: Left atrial size was normal in size. Right Atrium: Right atrial size was normal in size. Pericardium: There is no evidence of pericardial effusion. Mitral Valve: The mitral valve is normal in structure. Mild mitral annular calcification. No evidence of mitral valve regurgitation. No evidence of mitral valve stenosis. Tricuspid Valve: The tricuspid valve is normal in structure. Tricuspid valve regurgitation is not demonstrated. No evidence of tricuspid stenosis. Aortic Valve: The aortic valve is normal in structure. Aortic valve regurgitation is mild. Mild aortic valve sclerosis is present, with no evidence of aortic valve stenosis. Pulmonic Valve: The pulmonic valve was normal in structure. Pulmonic valve regurgitation is not visualized. No evidence of pulmonic stenosis. Aorta: The aortic root is normal in size and structure. Venous: The inferior vena cava is normal in size with greater than 50% respiratory variability, suggesting right atrial pressure of 3 mmHg. IAS/Shunts: No atrial level shunt detected by color flow Doppler.  LEFT VENTRICLE PLAX 2D  LVIDd:         5.30 cm  Diastology LVIDs:         3.70 cm  LV e' medial:    10.10 cm/s LV PW:         1.00 cm  LV E/e' medial:  10.8 LV IVS:        1.10 cm  LV e' lateral:   10.20 cm/s LVOT diam:     2.00 cm  LV E/e' lateral: 10.7 LV SV:         95 LV SV Index:   37 LVOT Area:     3.14 cm  RIGHT VENTRICLE RV Basal diam:  4.10 cm RV S prime:      13.80 cm/s TAPSE (M-mode): 2.5 cm LEFT ATRIUM             Index       RIGHT ATRIUM           Index LA diam:        4.80 cm 1.90 cm/m  RA Pressure: 3.00 mmHg LA Vol (A2C):   77.1 ml 30.50 ml/m RA Area:     17.80 cm LA Vol (A4C):   70.1 ml 27.73 ml/m RA Volume:   46.30 ml  18.32 ml/m LA Biplane Vol: 74.3 ml 29.39 ml/m  AORTIC VALVE LVOT Vmax:   147.00 cm/s LVOT Vmean:  103.000 cm/s LVOT VTI:    0.301 m  AORTA Ao Root diam: 3.80 cm Ao Asc diam:  3.70 cm MITRAL VALVE                TRICUSPID VALVE                             Estimated RAP:  3.00 mmHg MV Decel Time: 197 msec MV E velocity: 109.00 cm/s  SHUNTS MV A velocity: 115.00 cm/s  Systemic VTI:  0.30 m MV E/A ratio:  0.95         Systemic Diam: 2.00 cm Candee Furbish MD Electronically signed by Candee Furbish MD Signature Date/Time: 02/21/2021/2:09:47 PM    Final     Labs:  CBC: Recent Labs    07/27/20 1532  WBC 12.2*  HGB 13.8  HCT 41.4  PLT 425    COAGS: No results for input(s): INR, APTT in the last 8760 hours.  BMP: Recent Labs    07/27/20 1532 08/14/20 0000 02/14/21 1441 02/18/21 1555  NA 142 143 143 144  K 4.0 4.2 3.9 4.1  CL 100 104 101 101  CO2 21 30 28 22   GLUCOSE 200* 133 158* 174*  BUN 15 12 20 19   CALCIUM 9.6 9.7 9.5 9.5  CREATININE 1.08 1.04 1.23 1.22  GFRNONAA  --  68  --   --   GFRAA  --  79  --   --     LIVER FUNCTION TESTS: Recent Labs    07/27/20 1532 08/14/20 0000 02/14/21 1441  BILITOT 0.3 0.5 0.5  AST 19 17 19   ALT 21 21 22   ALKPHOS 133*  --   --   PROT 6.7 6.7 6.7  ALBUMIN 4.3  --   --     TUMOR MARKERS: Recent Labs    08/14/20 0000 02/14/21 1441  AFPTM 1.5 1.6      Assessment and Plan:  Steve Jensen is a 80 y.o. male with past medical history significant for type 2 diabetes, hypertension, hyperlipidemia, morbid obesity obstructive  sleep apnea and prostate cancer who is seen in consultation via telemedicine following the acquisition of a postprocedural abdominal MRI performed  12/24/2019, following technically successful hepatic microwave ablation performed 11/24/2018 and transcatheter bland embolization performed 02/15/2018 for biopsy-proven hepatocellular carcinoma.  He is seen today in telemedicine consultation following acquisition of surveillance abdominal MRI performed 02/22/2021 as well as AFP level obtained at 02/14/2021.   He remains asymptomatic in regards to his treated hepatocellular carcinoma.  Personal review of abdominal MRI performed 02/22/2021 demonstrates a questionable area of nodular enhancement involving the lateral aspect of the ablation cavity (image 96, series 901), not definitively seen on previous examinations though admittedly, examinations are degraded due to patient's body habitus.   The ablation cavity has reduced in size compared to the 12/23/2019 examination, presently measuring 4.7 x 2.5 cm, previously, 5.0 x 3.2 cm.  No additional areas of abnormal enhancement are identified to suggest development of an additional area of [place].  While the patient's AFP level is not elevated, 1.6 on obtain laboratory value from 01/25/2021, the AFP level was not found to be elevated on preintervention testing with an AFP of 1.2 on 12/30/2017.  Given the amorphous nature of the questionable area of recurrence as well as retraction of the ablation cavity, I feel that there is a potential that this could represent either artifact versus scarring and as such would recommend pursuing active surveillance and lieu of dedicated intervention at this time.  As such, we will proceed with obtaining an abdominal MRI 3 months (January 2023).  Above was discussed with the patient who demonstrated excellent understanding and is agreement with the proposed plan of care.  Plan: - Telemedicine consultation following acquisition of AFP level and next surveillance abdominal MRI in 3 months (in January 2023).  A copy of this report was sent to the requesting provider on this  date.  Electronically Signed: Sandi Mariscal 02/28/2021, 10:48 AM   I spent a total of 10 Minutes in remote  clinical consultation, greater than 50% of which was counseling/coordinating care for percutaneous management of [place].    Visit type: Audio only (telephone). Audio (no video) only due to patient's lack of internet/smartphone capability. Alternative for in-person consultation at [facility], [address], [place], [place]. This visit type was conducted due to national recommendations for restrictions regarding the COVID-19 Pandemic (e.g. social distancing).  This format is felt to be most appropriate for this patient at this time.  All issues noted in this document were discussed and addressed.

## 2021-03-01 LAB — CBC
Hematocrit: 35.6 % — ABNORMAL LOW (ref 37.5–51.0)
Hemoglobin: 11.1 g/dL — ABNORMAL LOW (ref 13.0–17.7)
MCH: 23.1 pg — ABNORMAL LOW (ref 26.6–33.0)
MCHC: 31.2 g/dL — ABNORMAL LOW (ref 31.5–35.7)
MCV: 74 fL — ABNORMAL LOW (ref 79–97)
Platelets: 414 10*3/uL (ref 150–450)
RBC: 4.81 x10E6/uL (ref 4.14–5.80)
RDW: 15.2 % (ref 11.6–15.4)
WBC: 10.2 10*3/uL (ref 3.4–10.8)

## 2021-03-04 ENCOUNTER — Ambulatory Visit (INDEPENDENT_AMBULATORY_CARE_PROVIDER_SITE_OTHER): Payer: Medicare Other

## 2021-03-04 ENCOUNTER — Encounter: Payer: Self-pay | Admitting: *Deleted

## 2021-03-04 DIAGNOSIS — R002 Palpitations: Secondary | ICD-10-CM

## 2021-03-04 DIAGNOSIS — I480 Paroxysmal atrial fibrillation: Secondary | ICD-10-CM

## 2021-03-04 NOTE — Addendum Note (Signed)
Addended by: Thompson Grayer on: 03/04/2021 11:59 AM   Modules accepted: Orders

## 2021-03-04 NOTE — Telephone Encounter (Signed)
If palpitations occur several times a week, can do 2 week Zio patch.   JV   I spoke with patient and reviewed CBC and chest X ray results with him.  He is having palpitations several times per week and would like to proceed with monitor.  He is aware this will be mailed to him.

## 2021-03-04 NOTE — Progress Notes (Unsigned)
Patient enrolled for [employer] to mail a 14 day ZIO XT monitor to his home. Letter with instructions mailed to patient.

## 2021-03-09 DIAGNOSIS — I480 Paroxysmal atrial fibrillation: Secondary | ICD-10-CM | POA: Diagnosis not present

## 2021-03-09 DIAGNOSIS — R002 Palpitations: Secondary | ICD-10-CM | POA: Diagnosis not present

## 2021-03-22 DIAGNOSIS — Z961 Presence of intraocular lens: Secondary | ICD-10-CM | POA: Diagnosis not present

## 2021-03-22 DIAGNOSIS — E113293 Type 2 diabetes mellitus with mild nonproliferative diabetic retinopathy without macular edema, bilateral: Secondary | ICD-10-CM | POA: Diagnosis not present

## 2021-03-22 DIAGNOSIS — H52203 Unspecified astigmatism, bilateral: Secondary | ICD-10-CM | POA: Diagnosis not present

## 2021-03-28 DIAGNOSIS — R002 Palpitations: Secondary | ICD-10-CM | POA: Diagnosis not present

## 2021-03-28 DIAGNOSIS — I480 Paroxysmal atrial fibrillation: Secondary | ICD-10-CM | POA: Diagnosis not present

## 2021-04-15 ENCOUNTER — Telehealth: Payer: Self-pay | Admitting: Interventional Cardiology

## 2021-04-15 NOTE — Telephone Encounter (Signed)
I spoke with patient and reviewed monitor results with him.  

## 2021-04-15 NOTE — Telephone Encounter (Signed)
Pt is calling regards hear monitor results

## 2021-04-23 ENCOUNTER — Other Ambulatory Visit: Payer: Self-pay

## 2021-04-23 ENCOUNTER — Telehealth (INDEPENDENT_AMBULATORY_CARE_PROVIDER_SITE_OTHER): Payer: Medicare Other | Admitting: Cardiology

## 2021-04-23 ENCOUNTER — Encounter: Payer: Self-pay | Admitting: Cardiology

## 2021-04-23 VITALS — Ht 71.0 in | Wt 305.0 lb

## 2021-04-23 DIAGNOSIS — G4733 Obstructive sleep apnea (adult) (pediatric): Secondary | ICD-10-CM | POA: Diagnosis not present

## 2021-04-23 DIAGNOSIS — I110 Hypertensive heart disease with heart failure: Secondary | ICD-10-CM

## 2021-04-23 NOTE — Patient Instructions (Signed)

## 2021-04-23 NOTE — Progress Notes (Signed)
Virtual Visit via [place] Note   This visit type was conducted due to national recommendations for restrictions regarding the COVID-19 Pandemic (e.g. social distancing) in an effort to limit this patient's exposure and mitigate transmission in our community.  Due to his co-morbid illnesses, this patient is at least at moderate risk for complications without adequate follow up.  This format is felt to be most appropriate for this patient at this time.  All issues noted in this document were discussed and addressed.  A limited physical exam was performed with this format.  Please refer to the patient's chart for his consent to telehealth for [facility].   Evaluation Performed:  Follow-up visit  Date:  04/23/2021   ID:  Steve Jensen, DOB 1941-04-18, MRN [id number]  Patient Location:  Home  Provider location:   [place]  PCP:  [name], MD  Sleep Medicine:  Fransico Him, ND Electrophysiologist:  None   Chief Complaint:  OSA  History of Present Illness:    Steve Jensen is a 80 y.o. male who presents via audio/video conferencing for a telehealth visit today.    [name] is a 80 y.o. male with a hx of OSA on BiPAP, morbid obesity and HTN.  He is doing well with his BiPAP device and thinks that he has gotten used to it.  He tolerates the mask and feels the pressure is adequate.  Since going on BiPAP he feels rested in the am and has no significant daytime sleepiness but does occasionally nap during the day.  He has problems with significant mouth dryness.  He is using a chin strap but still has dry mouth.  He says that he is not breathing through his mouth.  He does not think that he snores.     The patient does not have symptoms concerning for COVID-19 infection (fever, chills, cough, or new shortness of breath).   Prior CV studies:   The following studies were reviewed today:  PAP download  Past Medical History:  Diagnosis Date   Anemia    Diabetes mellitus  type II, controlled ([place])    with neuropathy   Dysrhythmia    A-Fib. cardioversion done   GERD (gastroesophageal reflux disease)    Hyperlipidemia    Hypertension    Morbid obesity ([place])    Neuromuscular disorder ([place])    feet   OSA (obstructive sleep apnea)    On BiPAP at 15/11cm H2O   Prostate cancer ([place])    Shingles    on face Nov 2010   Urinary incontinence    Past Surgical History:  Procedure Laterality Date   CARDIOVERSION N/A 09/06/2013   Procedure: CARDIOVERSION;  Surgeon: [name], MD;  Location: [facility] ENDOSCOPY;  Service: Cardiovascular;  Laterality: N/A;   EYE SURGERY     cataract surgery bilat; surgery to correct droopy eyelids    IR ANGIOGRAM SELECTIVE EACH ADDITIONAL VESSEL  02/15/2018   IR ANGIOGRAM SELECTIVE EACH ADDITIONAL VESSEL  02/15/2018   IR ANGIOGRAM SELECTIVE EACH ADDITIONAL VESSEL  02/15/2018   IR ANGIOGRAM SELECTIVE EACH ADDITIONAL VESSEL  02/15/2018   IR ANGIOGRAM SELECTIVE EACH ADDITIONAL VESSEL  02/15/2018   IR ANGIOGRAM VISCERAL SELECTIVE  02/15/2018   IR ANGIOGRAM VISCERAL SELECTIVE  02/15/2018   IR EMBO TUMOR ORGAN ISCHEMIA INFARCT INC GUIDE ROADMAPPING  02/15/2018   IR RADIOLOGIST EVAL & MGMT  01/26/2018   IR RADIOLOGIST EVAL & MGMT  03/17/2018   IR RADIOLOGIST EVAL & MGMT  06/29/2018  IR RADIOLOGIST EVAL & MGMT  07/27/2018   IR RADIOLOGIST EVAL & MGMT  10/28/2018   IR RADIOLOGIST EVAL & MGMT  12/14/2018   IR RADIOLOGIST EVAL & MGMT  03/17/2019   IR RADIOLOGIST EVAL & MGMT  07/14/2019   IR RADIOLOGIST EVAL & MGMT  12/27/2019   IR RADIOLOGIST EVAL & MGMT  08/21/2020   IR RADIOLOGIST EVAL & MGMT  02/28/2021   IR US GUIDE VASC ACCESS RIGHT  02/15/2018   knee surgery bilat      PROSTATE SURGERY     RADIOLOGY WITH ANESTHESIA N/A 11/24/2018   Procedure: MICROWAVE THERMAL ABLATION LIVER;  Surgeon: Sandi Mariscal, MD;  Location: [facility] ORS;  Service: Anesthesiology;  Laterality: N/A;   right shoulder rotator cuff surgery     TONSILLECTOMY       Current Meds   Medication Sig   allopurinol (ZYLOPRIM) 100 MG tablet Take 100 mg by mouth daily.   apixaban (ELIQUIS) 5 MG TABS tablet TAKE 1 TABLET(5 MG) BY MOUTH TWICE DAILY   CINNAMON PO Take 1,000 mg by mouth daily.   diltiazem (CARTIA XT) 240 MG 24 hr capsule Take 1 capsule (240 mg total) by mouth daily.   fish oil-omega-3 fatty acids 1000 MG capsule Take 2 g by mouth 2 (two) times daily.    furosemide (LASIX) 40 MG tablet TAKE 1 TABLET BY MOUTH  TWICE DAILY AS NEEDED   ibuprofen (ADVIL,MOTRIN) 200 MG tablet Take 800 mg by mouth every 6 (six) hours as needed (PAIN). prn   loratadine (CLARITIN) 10 MG tablet Take 10 mg by mouth daily as needed for allergies.    metFORMIN (GLUCOPHAGE) 1000 MG tablet Take 1,000 mg by mouth 2 (two) times daily.   metoprolol succinate (TOPROL-XL) 50 MG 24 hr tablet Take 50 mg by mouth daily.    Multiple Vitamins-Minerals (MULTIVITAMIN WITH MINERALS) tablet Take 1 tablet by mouth daily.   omeprazole (PRILOSEC) 20 MG capsule Take 20 mg by mouth daily.    oxymetazoline (AFRIN) 0.05 % nasal spray Place 1 spray into both nostrils 2 (two) times daily as needed for congestion.   potassium chloride SA (KLOR-CON) 20 MEQ tablet TAKE 1 TABLET BY MOUTH  DAILY AS NEEDED WHILE  TAKING FUROSEMIDE   pregabalin (LYRICA) 100 MG capsule Take 100 mg by mouth 2 (two) times a day.    ramipril (ALTACE) 10 MG capsule Take 10 mg by mouth 2 (two) times daily.   Semaglutide, 1 MG/DOSE, 2 MG/1.5ML SOPN Inject 3 mg into the skin once a week. Monday   ZETIA 10 MG tablet Take 10 mg by mouth daily.      Allergies:   Lipitor [atorvastatin], Simvastatin, Zithromax [azithromycin], and Amlodipine   Social History   Tobacco Use   Smoking status: Former    Packs/day: 1.00    Years: 15.00    Pack years: 15.00    Types: Cigarettes    Quit date: 05/27/1975    Years since quitting: 45.9   Smokeless tobacco: Never  Vaping Use   Vaping Use: Never used  Substance Use Topics   Alcohol use: No   Drug use:  No     Family Hx: The patient's family history includes CVA in his brother; Cancer in his brother; Diabetes in his brother, brother, and brother; Heart attack in his brother; Heart disease in his brother; Hypertension in his brother, brother, and brother; Prostate cancer in his brother; Stroke in his brother.  ROS:   Please see the history of  present illness.     All other systems reviewed and are negative.   Labs/Other Tests and Data Reviewed:    Recent Labs: 02/14/2021: ALT 22 02/18/2021: BUN 19; Creatinine, Ser 1.22; NT-Pro BNP 99; Potassium 4.1; Sodium 144 02/28/2021: Hemoglobin 11.1; Platelets 414   Recent Lipid Panel Lab Results  Component Value Date/Time   CHOL 158 12/18/2014 10:39 AM   TRIG 188.0 (H) 12/18/2014 10:39 AM   HDL 31.00 (L) 12/18/2014 10:39 AM   CHOLHDL 5 12/18/2014 10:39 AM   LDLCALC 89 12/18/2014 10:39 AM    Wt Readings from Last 3 Encounters:  04/23/21 (!) 305 lb (138.3 kg)  07/26/20 (!) 306 lb 6.4 oz (139 kg)  02/16/20 298 lb (135.2 kg)     Objective:    Vital Signs:  Ht 5\' 11"  (1.803 m)   Wt (!) 305 lb (138.3 kg)   BMI 42.54 kg/m    Well nourished, well developed male in no acute distress. Well appearing, alert and conversant, regular work of breathing,  good skin color  Eyes- anicteric mouth- oral mucosa is pink  neuro- grossly intact skin- no apparent rash or lesions or cyanosis  ASSESSMENT & PLAN:    1.  OSA - The patient is tolerating PAP therapy well without any problems. The PAP download performed by his DME was personally reviewed and interpreted by me today and showed an AHI of 0.2/hr on 15/11 cm H2O with 100% compliance in using more than 4 hours nightly.  The patient has been using and benefiting from PAP use and will continue to benefit from therapy.    2.  Hypertension  -Bp has been controlled at home -continue prescription drug management with Ramipril 10mg  BID, Toprol XL 50mg  daily and Cardizem CD 240mg  daily with PRN  refills.  3.  Morbid Obesity  -He is unable to exercise due to severe peripheral neuropathy.    Patient Risk:   After full review of this patient's clinical status, I feel that they are at least moderate risk at this time.  Time:   Today, I have spent 15 minutes on telemedicine discussing medical problems including OSA, HTN, obesity and reviewing patient's chart including PAP compliance download.  Medication Adjustments/Labs and Tests Ordered: Current medicines are reviewed at length with the patient today.  Concerns regarding medicines are outlined above.  Tests Ordered: No orders of the defined types were placed in this encounter.  Medication Changes: No orders of the defined types were placed in this encounter.   Disposition:  Follow up in 1 year(s)  Signed, Fransico Him, MD  04/23/2021 10:45 AM    [facility]

## 2021-05-08 DIAGNOSIS — G4733 Obstructive sleep apnea (adult) (pediatric): Secondary | ICD-10-CM | POA: Diagnosis not present

## 2021-05-16 DIAGNOSIS — E1140 Type 2 diabetes mellitus with diabetic neuropathy, unspecified: Secondary | ICD-10-CM | POA: Diagnosis not present

## 2021-05-16 DIAGNOSIS — I10 Essential (primary) hypertension: Secondary | ICD-10-CM | POA: Diagnosis not present

## 2021-05-16 DIAGNOSIS — E11319 Type 2 diabetes mellitus with unspecified diabetic retinopathy without macular edema: Secondary | ICD-10-CM | POA: Diagnosis not present

## 2021-05-16 DIAGNOSIS — E782 Mixed hyperlipidemia: Secondary | ICD-10-CM | POA: Diagnosis not present

## 2021-05-16 DIAGNOSIS — E119 Type 2 diabetes mellitus without complications: Secondary | ICD-10-CM | POA: Diagnosis not present

## 2021-06-04 ENCOUNTER — Other Ambulatory Visit: Payer: Self-pay | Admitting: Interventional Radiology

## 2021-06-04 ENCOUNTER — Other Ambulatory Visit: Payer: Self-pay

## 2021-06-04 DIAGNOSIS — C220 Liver cell carcinoma: Secondary | ICD-10-CM

## 2021-06-11 DIAGNOSIS — C220 Liver cell carcinoma: Secondary | ICD-10-CM | POA: Diagnosis not present

## 2021-06-12 LAB — COMPLETE METABOLIC PANEL WITH GFR
AG Ratio: 1.9 (calc) (ref 1.0–2.5)
ALT: 34 U/L (ref 9–46)
AST: 30 U/L (ref 10–35)
Albumin: 4.3 g/dL (ref 3.6–5.1)
Alkaline phosphatase (APISO): 94 U/L (ref 35–144)
BUN: 18 mg/dL (ref 7–25)
CO2: 25 mmol/L (ref 20–32)
Calcium: 9.5 mg/dL (ref 8.6–10.3)
Chloride: 104 mmol/L (ref 98–110)
Creat: 1.16 mg/dL (ref 0.70–1.28)
Globulin: 2.3 g/dL (calc) (ref 1.9–3.7)
Glucose, Bld: 165 mg/dL — ABNORMAL HIGH (ref 65–139)
Potassium: 3.8 mmol/L (ref 3.5–5.3)
Sodium: 142 mmol/L (ref 135–146)
Total Bilirubin: 0.5 mg/dL (ref 0.2–1.2)
Total Protein: 6.6 g/dL (ref 6.1–8.1)
eGFR: 64 mL/min/{1.73_m2} (ref >=60)

## 2021-06-12 LAB — AFP TUMOR MARKER: AFP-Tumor Marker: 1.5 ng/mL (ref <6.1)

## 2021-06-19 ENCOUNTER — Other Ambulatory Visit: Payer: Self-pay

## 2021-06-19 ENCOUNTER — Ambulatory Visit (HOSPITAL_COMMUNITY)
Admission: RE | Admit: 2021-06-19 | Discharge: 2021-06-19 | Disposition: A | Discharge status: Home / Self Care | Payer: Medicare Other | Source: Ambulatory Visit | Attending: Interventional Radiology | Admitting: Interventional Radiology

## 2021-06-19 DIAGNOSIS — K7689 Other specified diseases of liver: Secondary | ICD-10-CM | POA: Diagnosis not present

## 2021-06-19 DIAGNOSIS — K449 Diaphragmatic hernia without obstruction or gangrene: Secondary | ICD-10-CM | POA: Diagnosis not present

## 2021-06-19 DIAGNOSIS — C220 Liver cell carcinoma: Secondary | ICD-10-CM | POA: Insufficient documentation

## 2021-06-19 DIAGNOSIS — N281 Cyst of kidney, acquired: Secondary | ICD-10-CM | POA: Diagnosis not present

## 2021-06-19 MED ORDER — GADOBUTROL 1 MMOL/ML IV SOLN
10.0000 mL | Freq: Once | INTRAVENOUS | Status: AC | PRN
  Administered 2021-06-19: 17:00:00 10 mL via INTRAVENOUS

## 2021-06-25 ENCOUNTER — Encounter: Payer: Self-pay | Admitting: *Deleted

## 2021-06-25 ENCOUNTER — Other Ambulatory Visit: Payer: Self-pay

## 2021-06-25 ENCOUNTER — Ambulatory Visit
Admission: RE | Admit: 2021-06-25 | Discharge: 2021-06-25 | Disposition: A | Discharge status: Home / Self Care | Payer: Medicare Other | Source: Ambulatory Visit | Attending: Interventional Radiology | Admitting: Interventional Radiology

## 2021-06-25 DIAGNOSIS — C220 Liver cell carcinoma: Secondary | ICD-10-CM

## 2021-06-25 DIAGNOSIS — Z9889 Other specified postprocedural states: Secondary | ICD-10-CM | POA: Diagnosis not present

## 2021-06-25 HISTORY — PX: IR RADIOLOGIST EVAL & MGMT: IMG5224

## 2021-06-25 NOTE — Progress Notes (Signed)
Patient ID: Steve Jensen, male   DOB: January 24, 1941, 81 y.o.   MRN: [id number]        Chief Complaint: [facility], post bland embolization on 02/15/2018 followed by hepatic microwave ablation on 11/24/2018  Referring Physician(s): Burr Medico (Oncology) [name] (PCP)   History of Present Illness:  Steve Jensen is a 81 y.o. male with past medical history significant for type 2 diabetes, hypertension, hyperlipidemia, morbid obesity obstructive sleep apnea and prostate cancer who is seen in consultation via telemedicine following the acquisition of a postprocedural abdominal MRI performed 12/24/2019, following technically successful hepatic microwave ablation performed 11/24/2018 and transcatheter bland embolization performed 02/15/2018 for biopsy-proven hepatocellular carcinoma.  He is seen today in telemedicine consultation following acquisition of surveillance abdominal MRI performed 06/19/2021 as well as AFP level obtained at 06/11/2021.   He is currently without complaint.  Specifically, no abdominal pain, fever or chills.  No increased abdominal girth.  No yellowing of the skin or eyes.  No change in appetite or energy level.    Past Medical History:  Diagnosis Date   Anemia    Diabetes mellitus type II, controlled ([place])    with neuropathy   Dysrhythmia    A-Fib. cardioversion done   GERD (gastroesophageal reflux disease)    Hyperlipidemia    Hypertension    Morbid obesity ([place])    Neuromuscular disorder ([place])    feet   OSA (obstructive sleep apnea)    On BiPAP at 15/11cm H2O   Prostate cancer ([place])    Shingles    on face Nov 2010   Urinary incontinence     Past Surgical History:  Procedure Laterality Date   CARDIOVERSION N/A 09/06/2013   Procedure: CARDIOVERSION;  Surgeon: [name], MD;  Location: [place];  Service: Cardiovascular;  Laterality: N/A;   EYE SURGERY     cataract surgery bilat; surgery to correct droopy eyelids    IR ANGIOGRAM SELECTIVE EACH ADDITIONAL  VESSEL  02/15/2018   IR ANGIOGRAM SELECTIVE EACH ADDITIONAL VESSEL  02/15/2018   IR ANGIOGRAM SELECTIVE EACH ADDITIONAL VESSEL  02/15/2018   IR ANGIOGRAM SELECTIVE EACH ADDITIONAL VESSEL  02/15/2018   IR ANGIOGRAM SELECTIVE EACH ADDITIONAL VESSEL  02/15/2018   IR ANGIOGRAM VISCERAL SELECTIVE  02/15/2018   IR ANGIOGRAM VISCERAL SELECTIVE  02/15/2018   IR EMBO TUMOR ORGAN ISCHEMIA INFARCT INC GUIDE ROADMAPPING  02/15/2018   IR RADIOLOGIST EVAL & MGMT  01/26/2018   IR RADIOLOGIST EVAL & MGMT  03/17/2018   IR RADIOLOGIST EVAL & MGMT  06/29/2018   IR RADIOLOGIST EVAL & MGMT  07/27/2018   IR RADIOLOGIST EVAL & MGMT  10/28/2018   IR RADIOLOGIST EVAL & MGMT  12/14/2018   IR RADIOLOGIST EVAL & MGMT  03/17/2019   IR RADIOLOGIST EVAL & MGMT  07/14/2019   IR RADIOLOGIST EVAL & MGMT  12/27/2019   IR RADIOLOGIST EVAL & MGMT  08/21/2020   IR RADIOLOGIST EVAL & MGMT  02/28/2021   IR US GUIDE VASC ACCESS RIGHT  02/15/2018   knee surgery bilat      PROSTATE SURGERY     RADIOLOGY WITH ANESTHESIA N/A 11/24/2018   Procedure: MICROWAVE THERMAL ABLATION LIVER;  Surgeon: Sandi Mariscal, MD;  Location: [facility] ORS;  Service: Anesthesiology;  Laterality: N/A;   right shoulder rotator cuff surgery     TONSILLECTOMY      Allergies: Lipitor [atorvastatin], Simvastatin, Zithromax [azithromycin], and Amlodipine  Medications: Prior to Admission medications   Medication Sig Start Date End Date Taking? Authorizing  Provider  allopurinol (ZYLOPRIM) 100 MG tablet Take 100 mg by mouth daily. 04/17/21   [provider]  apixaban (ELIQUIS) 5 MG TABS tablet TAKE 1 TABLET(5 MG) BY MOUTH TWICE DAILY 11/15/18   [name], MD  CINNAMON PO Take 1,000 mg by mouth daily.    [provider]  diltiazem (CARTIA XT) 240 MG 24 hr capsule Take 1 capsule (240 mg total) by mouth daily. 08/17/20   [name], MD  fish oil-omega-3 fatty acids 1000 MG capsule Take 2 g by mouth 2 (two) times daily.     [provider]   furosemide (LASIX) 40 MG tablet TAKE 1 TABLET BY MOUTH  TWICE DAILY AS NEEDED 07/04/20   [name], MD  ibuprofen (ADVIL,MOTRIN) 200 MG tablet Take 800 mg by mouth every 6 (six) hours as needed (PAIN). prn 06/30/13   [provider]  loratadine (CLARITIN) 10 MG tablet Take 10 mg by mouth daily as needed for allergies.     [provider]  metFORMIN (GLUCOPHAGE) 1000 MG tablet Take 1,000 mg by mouth 2 (two) times daily. 06/27/14   [provider]  metoprolol succinate (TOPROL-XL) 50 MG 24 hr tablet Take 50 mg by mouth daily.  08/10/13   [provider]  Multiple Vitamins-Minerals (MULTIVITAMIN WITH MINERALS) tablet Take 1 tablet by mouth daily.    [provider]  omeprazole (PRILOSEC) 20 MG capsule Take 20 mg by mouth daily.  11/29/12   [provider]  oxymetazoline (AFRIN) 0.05 % nasal spray Place 1 spray into both nostrils 2 (two) times daily as needed for congestion.    [provider]  potassium chloride SA (KLOR-CON) 20 MEQ tablet TAKE 1 TABLET BY MOUTH  DAILY AS NEEDED WHILE  TAKING FUROSEMIDE 05/29/20   [name], MD  pregabalin (LYRICA) 100 MG capsule Take 100 mg by mouth 2 (two) times a day.  09/14/18   [provider]  ramipril (ALTACE) 10 MG capsule Take 10 mg by mouth 2 (two) times daily. 01/11/13   [provider]  Semaglutide, 1 MG/DOSE, 2 MG/1.5ML SOPN Inject 3 mg into the skin once a week. Monday    [provider]  ZETIA 10 MG tablet Take 10 mg by mouth daily.  01/17/13   [provider]     Family History  Problem Relation Age of Onset   Hypertension Brother    Diabetes Brother    Cancer Brother        bladder cancer   Diabetes Brother    CVA Brother    Hypertension Brother    Prostate cancer Brother    Diabetes Brother    Hypertension Brother    Heart disease Brother        CABG   Heart attack Brother    Stroke Brother     Social History   Socioeconomic  History   Marital status: Widowed    Spouse name: Not on file   Number of children: Not on file   Years of education: Not on file   Highest education level: Not on file  Occupational History   Not on file  Tobacco Use   Smoking status: Former    Packs/day: 1.00    Years: 15.00    Pack years: 15.00    Types: Cigarettes    Quit date: 05/27/1975    Years since quitting: 46.1   Smokeless tobacco: Never  Vaping Use   Vaping Use: Never used  Substance and Sexual Activity   Alcohol use: No   Drug use: No   Sexual activity: Not on file  Other Topics Concern   Not on file  Social History Narrative   Not on file   Social Determinants of Health   Financial Resource Strain: Not on file  Food Insecurity: Not on file  Transportation Needs: Not on file  Physical Activity: Not on file  Stress: Not on file  Social Connections: Not on file    ECOG Status: 1 - Symptomatic but completely ambulatory  Review of Systems  Review of Systems: A 12 point ROS discussed and pertinent positives are indicated in the HPI above.  All other systems are negative.  Physical Exam No direct physical exam was performed (except for noted visual exam findings with Video Visits).   Vital Signs: There were no vitals taken for this visit.  Imaging:  Following examinations were reviewed in detail: Abdominal MRI - 06/19/2021; 9/38/2022; 08/15/2020; 12/23/2019; 07/10/2019. Image guided hepatic microwave ablation - 11/24/2018. Bland hepatic embolization - 02/15/2018 CT abdomen and pelvis - 12/10/2017; 08/23/2018; 10/25/2018 Ultrasound-guided liver lesion biopsy-12/22/2017  Review of abdominal MRI performed 06/19/2021 demonstrates amorphous enhancement about the ablation site potentially representative of scarring though residual/recurrent disease could have a similar appearance.  There is an approximately 1.1 cm indeterminate soft tissue enhanced nodule within the right posterior lateral body wall/flank (image 47,  series 15), increased in size compared to the 02/2019 abdominal MRI, previously, 0.8 cm.  Note, this enhancing soft tissue nodule is NOT within the trajectory of the prior cryoablation.   MR ABDOMEN WWO CONTRAST  Result Date: 06/21/2021 CLINICAL DATA:  Follow-up HCC EXAM: MRI ABDOMEN WITHOUT AND WITH CONTRAST TECHNIQUE: Multiplanar multisequence MR imaging of the abdomen was performed both before and after the administration of intravenous contrast. CONTRAST:  31mL GADAVIST GADOBUTROL 1 MMOL/ML IV SOLN COMPARISON:  Multiple priors, most recent MRI abdomen dated February 22, 2021 FINDINGS: Lower chest: No acute findings. Hepatobiliary: Signal dropout on out of phase imaging, compatible with hepatic steatosis. Ovoid area of hypoenhancement seen in segment 6 of the liver with some heterogeneous intrinsic T1 hyperintensity. Images are somewhat motion degraded, nodular area of enhancement along the posterior margin is persistent but somewhat less prominent when compared with prior exam, measures approximately 17 x 9 mm on series 2, image 49, ablation site measures approximately 4.0 x 2.0 cm on series 14, image 49. Pancreas: No mass, inflammatory changes, or other parenchymal abnormality identified. Spleen:  Within normal limits in size and appearance. Adrenals/Urinary Tract: Stable right adrenal nodule measuring 2.7 cm. Left adrenal gland is unremarkable. Simple cyst of the left kidney. No hydronephrosis. Stomach/Bowel: Small hiatal hernia. Visualized bowel is unremarkable. Vascular/Lymphatic: No pathologically enlarged lymph nodes identified. No abdominal aortic aneurysm demonstrated. Other: Enhancing soft tissue nodule of the right posterior chest wall measuring 1.0 cm on series 15, image 47, has been slowly increasing in size when compared with multiple prior exams. Musculoskeletal: No suspicious bone lesions identified. IMPRESSION: 1. Treated lesion of segment 6 of the liver with unchanged size of the ablation  cavity. Images are somewhat motion degraded on today's exam, nodular area of enhancement along the posterior margin is persistent but less prominent when compared with prior, remains suspicious for viable disease. LR TR viable. 2. Enhancing soft tissue nodule of the right posterior chest wall which has been slowly increasing in size when compared with multiple prior exams. Recommend ultrasound and tissue sampling for further evaluation. Electronically Signed  By: Yetta Glassman M.D.   On: 06/21/2021 10:04    Labs:  CBC: Recent Labs    07/27/20 1532 02/28/21 1421  WBC 12.2* 10.2  HGB 13.8 11.1*  HCT 41.4 35.6*  PLT 425 414    COAGS: No results for input(s): INR, APTT in the last 8760 hours.  BMP: Recent Labs    08/14/20 0000 02/14/21 1441 02/18/21 1555 06/11/21 1635  NA 143 143 144 142  K 4.2 3.9 4.1 3.8  CL 104 101 101 104  CO2 30 28 22 25   GLUCOSE 133 158* 174* 165*  BUN 12 20 19 18   CALCIUM 9.7 9.5 9.5 9.5  CREATININE 1.04 1.23 1.22 1.16  GFRNONAA 68  --   --   --   GFRAA 79  --   --   --     LIVER FUNCTION TESTS: Recent Labs    07/27/20 1532 08/14/20 0000 02/14/21 1441 06/11/21 1635  BILITOT 0.3 0.5 0.5 0.5  AST 19 17 19 30   ALT 21 21 22  34  ALKPHOS 133*  --   --   --   PROT 6.7 6.7 6.7 6.6  ALBUMIN 4.3  --   --   --     TUMOR MARKERS: Recent Labs    08/14/20 0000 02/14/21 1441 06/11/21 1635  AFPTM 1.5 1.6 1.5     Assessment and Plan:  Steve Jensen is a 81 y.o. male with past medical history significant for type 2 diabetes, hypertension, hyperlipidemia, morbid obesity obstructive sleep apnea and prostate cancer who is seen in consultation via telemedicine following the acquisition of a postprocedural abdominal MRI performed 12/24/2019, following technically successful hepatic microwave ablation performed 11/24/2018 and transcatheter bland embolization performed 02/15/2018 for biopsy-proven hepatocellular carcinoma.  He is seen today in telemedicine  consultation following acquisition of surveillance abdominal MRI performed 02/22/2021 as well as AFP level obtained at 02/14/2021.   He remains asymptomatic in regards to his treated hepatocellular carcinoma.   Following examinations were reviewed in detail: Abdominal MRI - 06/19/2021; 9/38/2022; 08/15/2020; 12/23/2019; 07/10/2019. Image guided hepatic microwave ablation - 11/24/2018. Bland hepatic embolization - 02/15/2018 CT abdomen and pelvis - 12/10/2017; 08/23/2018; 10/25/2018 Ultrasound-guided liver lesion biopsy-12/22/2017  Review of abdominal MRI performed 06/19/2021 demonstrates amorphous enhancement about the ablation site potentially representative of scarring though residual/recurrent disease could have a similar appearance.  No additional areas of abnormal enhancement are identified to suggest development of an additional area of [place].  There is an approximately 1.1 cm indeterminate soft tissue enhanced nodule within the right posterior lateral body wall/flank (image 47, series 15), increased in size compared to the 02/2019 abdominal MRI, previously, 0.8 cm.  Note, this enhancing soft tissue nodule is NOT within the trajectory of the prior cryoablation.  While the patient's AFP level is not elevated, 1.5 on obtain laboratory value from 06/11/2021, the AFP level was NOT elevated on preintervention testing with an AFP of 1.2 on 12/30/2017.  Note, the patient's LFTs have remained within normal limits.  As surveillance abdominal MRIs have been somewhat limited secondary to patient body habitus, and as the patient's primary lesion was noted to be hyperenhancing on most recent contrast-enhanced abdominal CT performed 10/24/2020, I feel it is prudent to perform the next surveillance imaging with a cirrhotic protocol CT of the abdomen pelvis in 3 months, both to evaluate for the possibility of residual/recurrent disease as well as to evaluate the potential enhancing nodule within the right posterior lateral body  wall/flank.  Ultimately, the  patient may be a candidate for either repeat embolization and/or ablation as well as potential ultrasound-guided biopsy of the indeterminate subcutaneous nodule, however given the indolent appearance of both of these abnormalities, would NOT pursue intervention at this time.  The patient demonstrated excellent understanding of the above conversation and is agreement with the proposed plan of care.  Plan: - Telemedicine consultation following acquisition of AFP level, CMP and cirrhotic protocol CT of the abdomen and pelvis in 3 months (late April/early May 2023)  A copy of this report was sent to the requesting provider on this date.  Electronically Signed: Sandi Mariscal 06/25/2021, 10:49 AM   I spent a total of 15 Minutes in remote  clinical consultation, greater than 50% of which was counseling/coordinating care for postprocedural management of [place].    Visit type: Audio only (telephone). Audio (no video) only due to patient's lack of internet/smartphone capability. Alternative for in-person consultation at [facility], [address], [place], [place]. This visit type was conducted due to national recommendations for restrictions regarding the COVID-19 Pandemic (e.g. social distancing).  This format is felt to be most appropriate for this patient at this time.  All issues noted in this document were discussed and addressed.

## 2021-06-26 DIAGNOSIS — E782 Mixed hyperlipidemia: Secondary | ICD-10-CM | POA: Diagnosis not present

## 2021-06-26 DIAGNOSIS — I10 Essential (primary) hypertension: Secondary | ICD-10-CM | POA: Diagnosis not present

## 2021-06-26 DIAGNOSIS — E1140 Type 2 diabetes mellitus with diabetic neuropathy, unspecified: Secondary | ICD-10-CM | POA: Diagnosis not present

## 2021-06-26 DIAGNOSIS — Z7985 Long-term (current) use of injectable non-insulin antidiabetic drugs: Secondary | ICD-10-CM | POA: Diagnosis not present

## 2021-06-26 DIAGNOSIS — Z7984 Long term (current) use of oral hypoglycemic drugs: Secondary | ICD-10-CM | POA: Diagnosis not present

## 2021-06-28 ENCOUNTER — Other Ambulatory Visit: Payer: Self-pay | Admitting: Interventional Cardiology

## 2021-07-01 DIAGNOSIS — I10 Essential (primary) hypertension: Secondary | ICD-10-CM | POA: Diagnosis not present

## 2021-07-01 DIAGNOSIS — E782 Mixed hyperlipidemia: Secondary | ICD-10-CM | POA: Diagnosis not present

## 2021-07-26 ENCOUNTER — Other Ambulatory Visit: Payer: Self-pay | Admitting: Interventional Cardiology

## 2021-08-06 DIAGNOSIS — G4733 Obstructive sleep apnea (adult) (pediatric): Secondary | ICD-10-CM | POA: Diagnosis not present
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
## 2021-08-21 ENCOUNTER — Ambulatory Visit: Payer: Medicare Other | Admitting: Interventional Cardiology

## 2021-08-21 ENCOUNTER — Encounter (INDEPENDENT_AMBULATORY_CARE_PROVIDER_SITE_OTHER): Payer: Self-pay

## 2021-08-21 ENCOUNTER — Encounter: Payer: Self-pay | Admitting: Interventional Cardiology

## 2021-08-21 VITALS — BP 122/70 | HR 83 | Ht 71.0 in | Wt 302.0 lb

## 2021-08-21 DIAGNOSIS — I10 Essential (primary) hypertension: Secondary | ICD-10-CM

## 2021-08-21 DIAGNOSIS — I480 Paroxysmal atrial fibrillation: Secondary | ICD-10-CM | POA: Diagnosis not present

## 2021-08-21 DIAGNOSIS — I110 Hypertensive heart disease with heart failure: Secondary | ICD-10-CM | POA: Diagnosis not present

## 2021-08-21 DIAGNOSIS — I5032 Chronic diastolic (congestive) heart failure: Secondary | ICD-10-CM

## 2021-08-21 LAB — CBC
Hematocrit: 35.4 % — ABNORMAL LOW (ref 37.5–51.0)
Hemoglobin: 11.4 g/dL — ABNORMAL LOW (ref 13.0–17.7)
MCH: 22.8 pg — ABNORMAL LOW (ref 26.6–33.0)
MCHC: 32.2 g/dL (ref 31.5–35.7)
MCV: 71 fL — ABNORMAL LOW (ref 79–97)
Platelets: 408 10*3/uL (ref 150–450)
RBC: 5 x10E6/uL (ref 4.14–5.80)
RDW: 17.6 % — ABNORMAL HIGH (ref 11.6–15.4)
WBC: 11.1 10*3/uL — ABNORMAL HIGH (ref 3.4–10.8)

## 2021-08-21 NOTE — Patient Instructions (Signed)
Medication Instructions:  ?Your physician recommends that you continue on your current medications as directed. Please refer to the Current Medication list given to you today. ? ?*If you need a refill on your cardiac medications before your next appointment, please call your pharmacy* ? ? ?Lab Work: ?Lab work to be done today--CBC ? ?If you have labs (blood work) drawn today and your tests are completely normal, you will receive your results only by: ?MyChart Message (if you have MyChart) OR ?A paper copy in the mail ?If you have any lab test that is abnormal or we need to change your treatment, we will call you to review the results. ? ? ?Testing/Procedures: ?none ? ? ?Follow-Up: ?At [facility], you and your health needs are our priority.  As part of our continuing mission to provide you with exceptional heart care, we have created designated Provider Care Teams.  These Care Teams include your primary Cardiologist (physician) and Advanced Practice Providers (APPs -  Physician Assistants and Nurse Practitioners) who all work together to provide you with the care you need, when you need it. ? ?We recommend signing up for the patient portal called "MyChart".  Sign up information is provided on this After Visit Summary.  MyChart is used to connect with patients for Virtual Visits (Telemedicine).  Patients are able to view lab/test results, encounter notes, upcoming appointments, etc.  Non-urgent messages can be sent to your provider as well.   ?To learn more about what you can do with MyChart, go to [email or web address].   ? ?Your next appointment:   ?12 month(s) ? ?The format for your next appointment:   ?In Person ? ?Provider:   ?Larae Grooms, MD   ? ? ?Other Instructions ?  ? ?

## 2021-08-23 DIAGNOSIS — I10 Essential (primary) hypertension: Secondary | ICD-10-CM | POA: Diagnosis not present

## 2021-08-23 DIAGNOSIS — E1140 Type 2 diabetes mellitus with diabetic neuropathy, unspecified: Secondary | ICD-10-CM | POA: Diagnosis not present

## 2021-08-23 DIAGNOSIS — E782 Mixed hyperlipidemia: Secondary | ICD-10-CM | POA: Diagnosis not present

## 2021-09-06 DIAGNOSIS — G4733 Obstructive sleep apnea (adult) (pediatric): Secondary | ICD-10-CM | POA: Diagnosis not present

## 2021-09-08 ENCOUNTER — Other Ambulatory Visit: Payer: Self-pay | Admitting: Interventional Cardiology

## 2021-09-09 ENCOUNTER — Other Ambulatory Visit: Payer: Self-pay | Admitting: General Surgery

## 2021-09-09 ENCOUNTER — Other Ambulatory Visit: Payer: Self-pay | Admitting: Interventional Radiology

## 2021-09-09 DIAGNOSIS — C220 Liver cell carcinoma: Secondary | ICD-10-CM

## 2021-09-19 ENCOUNTER — Other Ambulatory Visit: Payer: Self-pay | Admitting: General Surgery

## 2021-09-19 ENCOUNTER — Other Ambulatory Visit: Payer: Self-pay | Admitting: Interventional Radiology

## 2021-09-19 ENCOUNTER — Other Ambulatory Visit: Payer: Self-pay | Admitting: *Deleted

## 2021-09-19 DIAGNOSIS — C220 Liver cell carcinoma: Secondary | ICD-10-CM

## 2021-10-03 DIAGNOSIS — M1711 Unilateral primary osteoarthritis, right knee: Secondary | ICD-10-CM | POA: Diagnosis not present

## 2021-10-09 DIAGNOSIS — C220 Liver cell carcinoma: Secondary | ICD-10-CM | POA: Diagnosis not present

## 2021-10-11 LAB — COMPLETE METABOLIC PANEL WITH GFR
AG Ratio: 1.9 (calc) (ref 1.0–2.5)
ALT: 23 U/L (ref 9–46)
AST: 16 U/L (ref 10–35)
Albumin: 4.1 g/dL (ref 3.6–5.1)
Alkaline phosphatase (APISO): 106 U/L (ref 35–144)
BUN/Creatinine Ratio: 21 (calc) (ref 6–22)
BUN: 26 mg/dL — ABNORMAL HIGH (ref 7–25)
CO2: 21 mmol/L (ref 20–32)
Calcium: 9.6 mg/dL (ref 8.6–10.3)
Chloride: 104 mmol/L (ref 98–110)
Creat: 1.21 mg/dL (ref 0.70–1.28)
Globulin: 2.2 g/dL (calc) (ref 1.9–3.7)
Glucose, Bld: 167 mg/dL — ABNORMAL HIGH (ref 65–139)
Potassium: 3.9 mmol/L (ref 3.5–5.3)
Sodium: 140 mmol/L (ref 135–146)
Total Bilirubin: 0.4 mg/dL (ref 0.2–1.2)
Total Protein: 6.3 g/dL (ref 6.1–8.1)
eGFR: 61 mL/min/{1.73_m2} (ref >=60)

## 2021-10-11 LAB — AFP TUMOR MARKER: AFP-Tumor Marker: 1.7 ng/mL (ref <6.1)

## 2021-10-15 ENCOUNTER — Ambulatory Visit
Admission: RE | Admit: 2021-10-15 | Discharge: 2021-10-15 | Disposition: A | Discharge status: Home / Self Care | Payer: Medicare Other | Source: Ambulatory Visit | Attending: General Surgery | Admitting: General Surgery

## 2021-10-15 DIAGNOSIS — C220 Liver cell carcinoma: Secondary | ICD-10-CM | POA: Diagnosis not present

## 2021-10-15 DIAGNOSIS — K449 Diaphragmatic hernia without obstruction or gangrene: Secondary | ICD-10-CM | POA: Diagnosis not present

## 2021-10-15 DIAGNOSIS — D3500 Benign neoplasm of unspecified adrenal gland: Secondary | ICD-10-CM | POA: Diagnosis not present

## 2021-10-15 MED ORDER — IOPAMIDOL (ISOVUE-300) INJECTION 61%
100.0000 mL | Freq: Once | INTRAVENOUS | Status: AC | PRN
  Administered 2021-10-15: 100 mL via INTRAVENOUS

## 2021-10-18 ENCOUNTER — Ambulatory Visit
Admission: RE | Admit: 2021-10-18 | Discharge: 2021-10-18 | Disposition: A | Discharge status: Home / Self Care | Payer: Medicare Other | Source: Ambulatory Visit | Attending: Interventional Radiology | Admitting: Interventional Radiology

## 2021-10-18 ENCOUNTER — Encounter: Payer: Self-pay | Admitting: *Deleted

## 2021-10-18 DIAGNOSIS — Z9889 Other specified postprocedural states: Secondary | ICD-10-CM | POA: Diagnosis not present

## 2021-10-18 DIAGNOSIS — C220 Liver cell carcinoma: Secondary | ICD-10-CM

## 2021-10-18 HISTORY — PX: IR RADIOLOGIST EVAL & MGMT: IMG5224

## 2021-10-18 NOTE — Progress Notes (Signed)
Patient ID: Steve Jensen, male   DOB: Nov 02, 1940, 81 y.o.   MRN: [id number]        Chief Complaint: Steve Jensen, post bland embolization on 02/15/2018 followed by hepatic microwave ablation on 11/24/2018   Referring Physician(s): Burr Medico (Oncology) [name] (PCP)   History of Present Illness:  Steve Jensen is a 81 y.o. male with past medical history significant for type 2 diabetes, hypertension, hyperlipidemia, morbid obesity obstructive sleep apnea and prostate cancer who is seen in consultation via telemedicine following the acquisition of a postprocedural abdominal MRI performed 12/24/2019, following technically successful hepatic microwave ablation performed 11/24/2018 and transcatheter bland embolization performed 02/15/2018 for biopsy-proven hepatocellular carcinoma.  He is seen today in telemedicine consultation following acquisition of surveillance cirrhotic protocol CT scan of the abdomen pelvis performed 10/15/2021 as well as AFP level performed 10/09/2021.  He is currently without complaint.  Specifically, no abdominal pain, fever or chills.  No increased abdominal girth.  No yellowing of the skin or eyes.  No change in appetite or energy level.    Past Medical History:  Diagnosis Date   Anemia    Diabetes mellitus type II, controlled ([place])    with neuropathy   Dysrhythmia    A-Fib. cardioversion done   GERD (gastroesophageal reflux disease)    Hyperlipidemia    Hypertension    Morbid obesity ([place])    Neuromuscular disorder ([place])    feet   OSA (obstructive sleep apnea)    On BiPAP at 15/11cm H2O   Prostate cancer ([place])    Shingles    on face Nov 2010   Urinary incontinence     Past Surgical History:  Procedure Laterality Date   CARDIOVERSION N/A 09/06/2013   Procedure: CARDIOVERSION;  Surgeon: [name], MD;  Location: [facility];  Service: Cardiovascular;  Laterality: N/A;   EYE SURGERY     cataract surgery bilat; surgery to correct droopy eyelids    IR  ANGIOGRAM SELECTIVE EACH ADDITIONAL VESSEL  02/15/2018   IR ANGIOGRAM SELECTIVE EACH ADDITIONAL VESSEL  02/15/2018   IR ANGIOGRAM SELECTIVE EACH ADDITIONAL VESSEL  02/15/2018   IR ANGIOGRAM SELECTIVE EACH ADDITIONAL VESSEL  02/15/2018   IR ANGIOGRAM SELECTIVE EACH ADDITIONAL VESSEL  02/15/2018   IR ANGIOGRAM VISCERAL SELECTIVE  02/15/2018   IR ANGIOGRAM VISCERAL SELECTIVE  02/15/2018   IR EMBO TUMOR ORGAN ISCHEMIA INFARCT INC GUIDE ROADMAPPING  02/15/2018   IR RADIOLOGIST EVAL & MGMT  01/26/2018   IR RADIOLOGIST EVAL & MGMT  03/17/2018   IR RADIOLOGIST EVAL & MGMT  06/29/2018   IR RADIOLOGIST EVAL & MGMT  07/27/2018   IR RADIOLOGIST EVAL & MGMT  10/28/2018   IR RADIOLOGIST EVAL & MGMT  12/14/2018   IR RADIOLOGIST EVAL & MGMT  03/17/2019   IR RADIOLOGIST EVAL & MGMT  07/14/2019   IR RADIOLOGIST EVAL & MGMT  12/27/2019   IR RADIOLOGIST EVAL & MGMT  08/21/2020   IR RADIOLOGIST EVAL & MGMT  02/28/2021   IR RADIOLOGIST EVAL & MGMT  06/25/2021   IR US GUIDE VASC ACCESS RIGHT  02/15/2018   knee surgery bilat      PROSTATE SURGERY     RADIOLOGY WITH ANESTHESIA N/A 11/24/2018   Procedure: MICROWAVE THERMAL ABLATION LIVER;  Surgeon: Sandi Mariscal, MD;  Location: [facility];  Service: Anesthesiology;  Laterality: N/A;   right shoulder rotator cuff surgery     TONSILLECTOMY      Allergies: Lipitor [atorvastatin], Simvastatin, Zithromax [azithromycin], and Amlodipine  Medications:  Prior to Admission medications   Medication Sig Start Date End Date Taking? Authorizing Provider  allopurinol (ZYLOPRIM) 100 MG tablet Take 100 mg by mouth daily. 04/17/21   [provider]  apixaban (ELIQUIS) 5 MG TABS tablet TAKE 1 TABLET(5 MG) BY MOUTH TWICE DAILY 11/15/18   [name], MD  CINNAMON PO Take 1,000 mg by mouth daily.    [provider]  diltiazem (CARDIZEM CD) 240 MG 24 hr capsule TAKE 1 CAPSULE BY MOUTH  DAILY 09/09/21   [name], MD  fish oil-omega-3 fatty acids 1000 MG capsule Take 2 g by  mouth 2 (two) times daily.     [provider]  furosemide (LASIX) 40 MG tablet TAKE 1 TABLET BY MOUTH  TWICE DAILY AS NEEDED 07/26/21   [name], MD  ibuprofen (ADVIL,MOTRIN) 200 MG tablet Take 800 mg by mouth every 6 (six) hours as needed (PAIN). prn 06/30/13   [provider]  loratadine (CLARITIN) 10 MG tablet Take 10 mg by mouth daily as needed for allergies.     [provider]  metFORMIN (GLUCOPHAGE) 1000 MG tablet Take 1,000 mg by mouth 2 (two) times daily. 06/27/14   [provider]  metoprolol succinate (TOPROL-XL) 50 MG 24 hr tablet Take 50 mg by mouth daily.  08/10/13   [provider]  Multiple Vitamins-Minerals (MULTIVITAMIN WITH MINERALS) tablet Take 1 tablet by mouth daily.    [provider]  omeprazole (PRILOSEC) 20 MG capsule Take 20 mg by mouth daily.  11/29/12   [provider]  oxymetazoline (AFRIN) 0.05 % nasal spray Place 1 spray into both nostrils 2 (two) times daily as needed for congestion.    [provider]  potassium chloride SA (KLOR-CON M) 20 MEQ tablet TAKE 1 TABLET BY MOUTH  DAILY AS NEEDED WHILE  TAKING FUROSEMIDE 06/28/21   [name], MD  pregabalin (LYRICA) 100 MG capsule Take 100 mg by mouth 2 (two) times a day.  09/14/18   [provider]  ramipril (ALTACE) 10 MG capsule Take 10 mg by mouth 2 (two) times daily. 01/11/13   [provider]  Semaglutide, 1 MG/DOSE, 2 MG/1.5ML SOPN Inject 3 mg into the skin once a week. Monday    [provider]  tirzepatide Darcel Bayley) 5 MG/0.5ML Pen Inject 5 mg into the skin once a week.    [provider]  ZETIA 10 MG tablet Take 10 mg by mouth daily.  01/17/13   [provider]     Family History  Problem Relation Age of Onset   Hypertension Brother    Diabetes Brother    Cancer Brother        bladder cancer   Diabetes Brother    CVA Brother    Hypertension Brother    Prostate cancer Brother     Diabetes Brother    Hypertension Brother    Heart disease Brother        CABG   Heart attack Brother    Stroke Brother     Social History   Socioeconomic History   Marital status: Widowed    Spouse name: Not on file   Number of children: Not on file   Years of education: Not on file   Highest education level: Not on file  Occupational History   Not on file  Tobacco Use   Smoking status: Former    Packs/day: 1.00    Years: 15.00    Pack years:  15.00    Types: Cigarettes    Quit date: 05/27/1975    Years since quitting: 46.4   Smokeless tobacco: Never  Vaping Use   Vaping Use: Never used  Substance and Sexual Activity   Alcohol use: No   Drug use: No   Sexual activity: Not on file  Other Topics Concern   Not on file  Social History Narrative   Not on file   Social Determinants of Health   Financial Resource Strain: Not on file  Food Insecurity: Not on file  Transportation Needs: Not on file  Physical Activity: Not on file  Stress: Not on file  Social Connections: Not on file    ECOG Status: 1 - Symptomatic but completely ambulatory  Review of Systems  Review of Systems: A 12 point ROS discussed and pertinent positives are indicated in the HPI above.  All other systems are negative.  Physical Exam No direct physical exam was performed (except for noted visual exam findings with Video Visits).   Vital Signs: There were no vitals taken for this visit.  Imaging:  Following examinations were reviewed in detail  Cirrhotic protocol CT scan abdomen and pelvis - 10/15/2021 Abdominal MRI-06/19/2021; 02/22/2021; 08/15/2020; 12/23/2019; 07/10/2019  Personal review of cirrhotic protocol CT scan the abdomen pelvis performed 10/15/2021 demonstrates a technically excellent result without definitive residual nodular enhancing tissue to suggest locally recurrent or residual disease at the site of previous bland embolization and image guided ablation.    Re-demonstrated tiny  (approximately 1.1 x 0.8 cm) nodule between the right 11th and 12th ribs, unchanged compared to abdominal MRI performed 12/23/2019 however not confidently identified on more remote examinations.    CT ABDOMEN PELVIS W WO CONTRAST  Result Date: 10/17/2021 CLINICAL DATA:  Hepatocellular carcinoma with prior ablation. Also history prostate cancer. Restaging assessment. * Tracking Code: BO * EXAM: CT ABDOMEN AND PELVIS WITHOUT AND WITH CONTRAST TECHNIQUE: Multidetector CT imaging of the abdomen and pelvis was performed following the standard protocol before and following the bolus administration of intravenous contrast. RADIATION DOSE REDUCTION: This exam was performed according to the departmental dose-optimization program which includes automated exposure control, adjustment of the mA and/or kV according to patient size and/or use of iterative reconstruction technique. CONTRAST:  173m ISOVUE-300 IOPAMIDOL (ISOVUE-300) INJECTION 61% COMPARISON:  06/19/2021 FINDINGS: Lower chest: Ill-defined patchy ground-glass opacities in the right upper lobe centrally on image 1 series 4 partially included on today's exam. Bandlike scarring and volume loss in the right lower lobe. Juxta diaphragmatic nodule adjacent to the major fissure 0.7 by 0.6 cm on image 16 series 4, similar to 01/12/2018. Mild subpleural nodularity peripherally in the left lower lobe likewise similar to 2019. Left anterior descending and descending thoracic aortic atherosclerosis. Old granulomatous disease. Small type 1 hiatal hernia. Hepatobiliary: Primarily ovoid complex hypoenhancing lesion in segment 6 shown on image 79 series 15 with some higher density elements precontrast and postcontrast within the lesion. Difficult to assess for small areas of internal or marginal enhancement, but for the most part where not demonstrating substantial overall enhancement of the lesion. On prior MRI there was suspicion for viable disease but this is not well seen  or reflected on today's CT. No new focus of arterial phase enhancement is identified. Faint dependent density in the gallbladder raising suspicion for gallstones for example on image 36 series 2. No biliary dilatation. Pancreas: Punctate calcifications in the pancreatic head, probably postinflammatory. Spleen: Unremarkable Adrenals/Urinary Tract: Stable 2.7 by 1.8 cm adrenal adenoma. Left  adrenal gland normal. Kidneys unremarkable. Urinary bladder unremarkable. 8 mm left mid kidney cyst on image 80 series 15, this lesion does not require further workup and appears benign. Stomach/Bowel: Unremarkable Vascular/Lymphatic: Atherosclerosis is present, including aortoiliac atherosclerotic disease. Infrarenal abdominal aortic aneurysm 3.0 cm in diameter, image one hundred six series 15, stable from 10/25/2018. Reproductive: Prostatectomy. Other: Nonspecific 9 by 8 mm enhancing nodule in between the lower portion of the right latissimus dorsi and the eleventh intercostal musculature. This has been noted to be enhancing on prior MRI. Musculoskeletal: Healed right lateral rib fractures. Lower thoracic and lumbar spondylosis. IMPRESSION: 1. No definite viable hepatic tumor is identified on today's CT, although this is in contrast with the 06/19/2021 MRI which may have a higher degree of diagnostic sensitivity due to the capability for subtraction imaging. I do not currently identified definite enhancement along the segment 6 ablation site. 2. 8 by 7 mm nodule or lymph node in between the eleventh intercostal musculature and the latissimus dorsi, as remarked upon on prior MRI, this could represent a lymph node or small metastatic deposit. 3. Ill-defined ground-glass opacities in the right upper lobe, probably reflecting alveolitis but only partially included on today's CT of the abdomen. This could be fully characterized with CT chest if clinically warranted. 4. Infrarenal abdominal aortic aneurysm 3.0 cm in diameter, unchanged  from 2020. Recommend follow-up ultrasound every 3 years. This recommendation follows ACR consensus guidelines: White Paper of the ACR Incidental Findings Committee II on Vascular Findings. J Am Coll Radiol 2013; 10:789-794. 5. Other imaging findings of potential clinical significance: Aortic Atherosclerosis (ICD10-I70.0). Coronary and systemic atherosclerosis. Small type 1 hiatal hernia. Small right adrenal adenoma. Electronically Signed   By: Van Clines M.D.   On: 10/17/2021 12:38    Labs:  CBC: Recent Labs    02/28/21 1421 08/21/21 1544  WBC 10.2 11.1*  HGB 11.1* 11.4*  HCT 35.6* 35.4*  PLT 414 408    COAGS: No results for input(s): INR, APTT in the last 8760 hours.  BMP: Recent Labs    02/14/21 1441 02/18/21 1555 06/11/21 1635 10/09/21 1732  NA 143 144 142 140  K 3.9 4.1 3.8 3.9  CL 101 101 104 104  CO2 '28 22 25 21  '$ GLUCOSE 158* 174* 165* 167*  BUN '20 19 18 '$ 26*  CALCIUM 9.5 9.5 9.5 9.6  CREATININE 1.23 1.22 1.16 1.21    LIVER FUNCTION TESTS: Recent Labs    02/14/21 1441 06/11/21 1635 10/09/21 1732  BILITOT 0.5 0.5 0.4  AST '19 30 16  '$ ALT 22 34 23  PROT 6.7 6.6 6.3    TUMOR MARKERS:    Assessment and Plan:   Toa OLIVIER FRAYRE is a 81 y.o. male with past medical history significant for type 2 diabetes, hypertension, hyperlipidemia, morbid obesity obstructive sleep apnea and prostate cancer who is seen in consultation via telemedicine following the acquisition of a postprocedural abdominal MRI performed 12/24/2019, following technically successful hepatic microwave ablation performed 11/24/2018 and transcatheter bland embolization performed 02/15/2018 for biopsy-proven hepatocellular carcinoma.  He is seen today in telemedicine consultation following acquisition of surveillance cirrhotic protocol CT scan of the abdomen pelvis performed 10/15/2021 as well as AFP level performed 10/09/2021.   He remains asymptomatic in regards to his treated hepatocellular  carcinoma.   Following examinations were reviewed in detail  Cirrhotic protocol CT scan abdomen and pelvis - 10/15/2021 Abdominal MRI-06/19/2021; 02/22/2021; 08/15/2020; 12/23/2019; 07/10/2019  While the patient's AFP level is not elevated, 1.7 on obtain  laboratory value from 10/09/2021, however the AFP level was NOT elevated on preintervention testing with an AFP of 1.2 on 12/30/2017.  Note, the patient's LFTs have remained within normal limits.  Personal review of cirrhotic protocol CT scan the abdomen pelvis performed 10/15/2021 demonstrates a technically excellent result without definitive residual nodular enhancing tissue to suggest locally recurrent or residual disease at the site of previous bland embolization and image guided ablation.    Re-demonstrated tiny (approximately 1.1 x 0.8 cm) nodule between the right 11th and 12th ribs, unchanged compared to abdominal MRI performed 12/23/2019, not confidently identified on more remote examinations.  While this ventral wall nodule remains indeterminate, given stability for the past nearly 2 years, this nodule is favored to be benign etiology (potentially a mildly prominent lymph node) and was NOT along the track of the image guided ablation performed 11/24/2018 to suggest that it could represent tract seeding.  Given above, we will return to surveillance imaging with MRI and with the next surveillance scan to be performed in 6 months (late November/early December 2023).    As patient's AFP values have NOT been elevated and there are currently no worrisome imaging findings to suggest recurrent or residual disease we would NOT pursue repeat AFP level at this time.   The patient demonstrated excellent understanding of the above conversation and is agreement with the proposed plan of care.  He knows to call the interventional radiology clinic with any interval questions or concerns.  Plan: - Telemedicine consultation following acquisition of surveillance  abdominal MRI in 6 months (late November/early December 2023)   A copy of this report was sent to the requesting provider on this date.   Thank you for this interesting consult.  I greatly enjoyed meeting Jashon F Loiseau and look forward to participating in their care.  A copy of this report was sent to the requesting provider on this date.  Electronically Signed: Sandi Mariscal 10/18/2021, 1:45 PM   I spent a total of 15 Minutes in remote  clinical consultation, greater than 50% of which was counseling/coordinating care for [facility], post embolization and ablation.    Visit type: Audio only (telephone). Audio (no video) only due to patient's lack of internet/smartphone capability. Alternative for in-person consultation at [facility], [address], [place], [place]. This visit type was conducted due to national recommendations for restrictions regarding the COVID-19 Pandemic (e.g. social distancing).  This format is felt to be most appropriate for this patient at this time.  All issues noted in this document were discussed and addressed.

## 2021-10-23 DIAGNOSIS — I10 Essential (primary) hypertension: Secondary | ICD-10-CM | POA: Diagnosis not present

## 2021-10-23 DIAGNOSIS — E1140 Type 2 diabetes mellitus with diabetic neuropathy, unspecified: Secondary | ICD-10-CM | POA: Diagnosis not present

## 2021-10-23 DIAGNOSIS — E782 Mixed hyperlipidemia: Secondary | ICD-10-CM | POA: Diagnosis not present

## 2021-10-30 ENCOUNTER — Other Ambulatory Visit: Payer: Self-pay | Admitting: Interventional Cardiology

## 2021-11-02 ENCOUNTER — Other Ambulatory Visit: Payer: Self-pay | Admitting: Interventional Cardiology

## 2021-11-20 DIAGNOSIS — E782 Mixed hyperlipidemia: Secondary | ICD-10-CM | POA: Diagnosis not present

## 2021-11-20 DIAGNOSIS — I10 Essential (primary) hypertension: Secondary | ICD-10-CM | POA: Diagnosis not present

## 2021-11-20 DIAGNOSIS — E1140 Type 2 diabetes mellitus with diabetic neuropathy, unspecified: Secondary | ICD-10-CM | POA: Diagnosis not present

## 2021-11-20 DIAGNOSIS — M1990 Unspecified osteoarthritis, unspecified site: Secondary | ICD-10-CM | POA: Diagnosis not present

## 2021-12-16 ENCOUNTER — Emergency Department (HOSPITAL_COMMUNITY): Payer: Medicare Other

## 2021-12-16 ENCOUNTER — Other Ambulatory Visit: Payer: Self-pay

## 2021-12-16 ENCOUNTER — Inpatient Hospital Stay (HOSPITAL_COMMUNITY)
Admission: EM | Admit: 2021-12-16 | Discharge: 2021-12-27 | DRG: 871 | Disposition: A | Discharge status: Skilled Nursing Facility | Payer: Medicare Other | Attending: Internal Medicine | Admitting: Internal Medicine

## 2021-12-16 ENCOUNTER — Encounter (HOSPITAL_COMMUNITY): Payer: Self-pay

## 2021-12-16 DIAGNOSIS — K219 Gastro-esophageal reflux disease without esophagitis: Secondary | ICD-10-CM | POA: Diagnosis not present

## 2021-12-16 DIAGNOSIS — E876 Hypokalemia: Secondary | ICD-10-CM | POA: Diagnosis not present

## 2021-12-16 DIAGNOSIS — Z6841 Body Mass Index (BMI) 40.0 and over, adult: Secondary | ICD-10-CM

## 2021-12-16 DIAGNOSIS — R000 Tachycardia, unspecified: Secondary | ICD-10-CM | POA: Diagnosis not present

## 2021-12-16 DIAGNOSIS — Z7901 Long term (current) use of anticoagulants: Secondary | ICD-10-CM

## 2021-12-16 DIAGNOSIS — K5939 Other megacolon: Secondary | ICD-10-CM | POA: Diagnosis not present

## 2021-12-16 DIAGNOSIS — I10 Essential (primary) hypertension: Secondary | ICD-10-CM | POA: Diagnosis present

## 2021-12-16 DIAGNOSIS — R262 Difficulty in walking, not elsewhere classified: Secondary | ICD-10-CM | POA: Diagnosis not present

## 2021-12-16 DIAGNOSIS — D649 Anemia, unspecified: Secondary | ICD-10-CM | POA: Diagnosis not present

## 2021-12-16 DIAGNOSIS — Z7984 Long term (current) use of oral hypoglycemic drugs: Secondary | ICD-10-CM

## 2021-12-16 DIAGNOSIS — I480 Paroxysmal atrial fibrillation: Secondary | ICD-10-CM | POA: Diagnosis present

## 2021-12-16 DIAGNOSIS — E785 Hyperlipidemia, unspecified: Secondary | ICD-10-CM | POA: Diagnosis present

## 2021-12-16 DIAGNOSIS — N179 Acute kidney failure, unspecified: Secondary | ICD-10-CM | POA: Diagnosis not present

## 2021-12-16 DIAGNOSIS — Z8042 Family history of malignant neoplasm of prostate: Secondary | ICD-10-CM

## 2021-12-16 DIAGNOSIS — R2689 Other abnormalities of gait and mobility: Secondary | ICD-10-CM | POA: Diagnosis not present

## 2021-12-16 DIAGNOSIS — E1140 Type 2 diabetes mellitus with diabetic neuropathy, unspecified: Secondary | ICD-10-CM | POA: Diagnosis not present

## 2021-12-16 DIAGNOSIS — Z888 Allergy status to other drugs, medicaments and biological substances status: Secondary | ICD-10-CM | POA: Diagnosis not present

## 2021-12-16 DIAGNOSIS — J9601 Acute respiratory failure with hypoxia: Secondary | ICD-10-CM | POA: Diagnosis not present

## 2021-12-16 DIAGNOSIS — I110 Hypertensive heart disease with heart failure: Secondary | ICD-10-CM | POA: Diagnosis present

## 2021-12-16 DIAGNOSIS — R0602 Shortness of breath: Secondary | ICD-10-CM | POA: Diagnosis not present

## 2021-12-16 DIAGNOSIS — R059 Cough, unspecified: Secondary | ICD-10-CM | POA: Diagnosis not present

## 2021-12-16 DIAGNOSIS — I5032 Chronic diastolic (congestive) heart failure: Secondary | ICD-10-CM | POA: Diagnosis present

## 2021-12-16 DIAGNOSIS — R6510 Systemic inflammatory response syndrome (SIRS) of non-infectious origin without acute organ dysfunction: Secondary | ICD-10-CM

## 2021-12-16 DIAGNOSIS — R6520 Severe sepsis without septic shock: Secondary | ICD-10-CM | POA: Diagnosis present

## 2021-12-16 DIAGNOSIS — R52 Pain, unspecified: Secondary | ICD-10-CM | POA: Diagnosis not present

## 2021-12-16 DIAGNOSIS — R279 Unspecified lack of coordination: Secondary | ICD-10-CM | POA: Diagnosis not present

## 2021-12-16 DIAGNOSIS — A419 Sepsis, unspecified organism: Principal | ICD-10-CM | POA: Diagnosis present

## 2021-12-16 DIAGNOSIS — J399 Disease of upper respiratory tract, unspecified: Secondary | ICD-10-CM | POA: Diagnosis not present

## 2021-12-16 DIAGNOSIS — M1711 Unilateral primary osteoarthritis, right knee: Secondary | ICD-10-CM | POA: Diagnosis present

## 2021-12-16 DIAGNOSIS — E1165 Type 2 diabetes mellitus with hyperglycemia: Secondary | ICD-10-CM | POA: Diagnosis not present

## 2021-12-16 DIAGNOSIS — M25561 Pain in right knee: Secondary | ICD-10-CM | POA: Diagnosis not present

## 2021-12-16 DIAGNOSIS — R1312 Dysphagia, oropharyngeal phase: Secondary | ICD-10-CM | POA: Diagnosis not present

## 2021-12-16 DIAGNOSIS — Z881 Allergy status to other antibiotic agents status: Secondary | ICD-10-CM | POA: Diagnosis not present

## 2021-12-16 DIAGNOSIS — Z87891 Personal history of nicotine dependence: Secondary | ICD-10-CM

## 2021-12-16 DIAGNOSIS — M6259 Muscle wasting and atrophy, not elsewhere classified, multiple sites: Secondary | ICD-10-CM | POA: Diagnosis not present

## 2021-12-16 DIAGNOSIS — K567 Ileus, unspecified: Secondary | ICD-10-CM | POA: Diagnosis not present

## 2021-12-16 DIAGNOSIS — Z20822 Contact with and (suspected) exposure to covid-19: Secondary | ICD-10-CM | POA: Diagnosis not present

## 2021-12-16 DIAGNOSIS — K6389 Other specified diseases of intestine: Secondary | ICD-10-CM | POA: Diagnosis not present

## 2021-12-16 DIAGNOSIS — Z8249 Family history of ischemic heart disease and other diseases of the circulatory system: Secondary | ICD-10-CM

## 2021-12-16 DIAGNOSIS — Z833 Family history of diabetes mellitus: Secondary | ICD-10-CM

## 2021-12-16 DIAGNOSIS — Z741 Need for assistance with personal care: Secondary | ICD-10-CM | POA: Diagnosis not present

## 2021-12-16 DIAGNOSIS — Z7985 Long-term (current) use of injectable non-insulin antidiabetic drugs: Secondary | ICD-10-CM | POA: Diagnosis not present

## 2021-12-16 DIAGNOSIS — Z8546 Personal history of malignant neoplasm of prostate: Secondary | ICD-10-CM

## 2021-12-16 DIAGNOSIS — J189 Pneumonia, unspecified organism: Secondary | ICD-10-CM | POA: Diagnosis not present

## 2021-12-16 DIAGNOSIS — M6281 Muscle weakness (generalized): Secondary | ICD-10-CM | POA: Diagnosis not present

## 2021-12-16 DIAGNOSIS — G4733 Obstructive sleep apnea (adult) (pediatric): Secondary | ICD-10-CM | POA: Diagnosis not present

## 2021-12-16 DIAGNOSIS — Z743 Need for continuous supervision: Secondary | ICD-10-CM | POA: Diagnosis not present

## 2021-12-16 DIAGNOSIS — I493 Ventricular premature depolarization: Secondary | ICD-10-CM | POA: Diagnosis not present

## 2021-12-16 DIAGNOSIS — E119 Type 2 diabetes mellitus without complications: Secondary | ICD-10-CM | POA: Diagnosis not present

## 2021-12-16 DIAGNOSIS — Z9889 Other specified postprocedural states: Secondary | ICD-10-CM | POA: Diagnosis not present

## 2021-12-16 DIAGNOSIS — Z79899 Other long term (current) drug therapy: Secondary | ICD-10-CM

## 2021-12-16 DIAGNOSIS — Z7401 Bed confinement status: Secondary | ICD-10-CM | POA: Diagnosis not present

## 2021-12-16 DIAGNOSIS — R933 Abnormal findings on diagnostic imaging of other parts of digestive tract: Secondary | ICD-10-CM | POA: Diagnosis not present

## 2021-12-16 DIAGNOSIS — R079 Chest pain, unspecified: Secondary | ICD-10-CM | POA: Diagnosis not present

## 2021-12-16 DIAGNOSIS — G4730 Sleep apnea, unspecified: Secondary | ICD-10-CM | POA: Diagnosis not present

## 2021-12-16 DIAGNOSIS — K3189 Other diseases of stomach and duodenum: Secondary | ICD-10-CM | POA: Diagnosis not present

## 2021-12-16 DIAGNOSIS — I4891 Unspecified atrial fibrillation: Secondary | ICD-10-CM | POA: Diagnosis present

## 2021-12-16 DIAGNOSIS — K7689 Other specified diseases of liver: Secondary | ICD-10-CM | POA: Diagnosis not present

## 2021-12-16 LAB — CBC WITH DIFFERENTIAL/PLATELET
Abs Immature Granulocytes: 0.2 10*3/uL — ABNORMAL HIGH (ref 0.00–0.07)
Basophils Absolute: 0.1 10*3/uL (ref 0.0–0.1)
Basophils Relative: 0 %
Eosinophils Absolute: 0.1 10*3/uL (ref 0.0–0.5)
Eosinophils Relative: 1 %
HCT: 29.3 % — ABNORMAL LOW (ref 39.0–52.0)
Hemoglobin: 9 g/dL — ABNORMAL LOW (ref 13.0–17.0)
Immature Granulocytes: 1 %
Lymphocytes Relative: 11 %
Lymphs Abs: 2.3 10*3/uL (ref 0.7–4.0)
MCH: 20.4 pg — ABNORMAL LOW (ref 26.0–34.0)
MCHC: 30.7 g/dL (ref 30.0–36.0)
MCV: 66.4 fL — ABNORMAL LOW (ref 80.0–100.0)
Monocytes Absolute: 2.4 10*3/uL — ABNORMAL HIGH (ref 0.1–1.0)
Monocytes Relative: 12 %
Neutro Abs: 15.7 10*3/uL — ABNORMAL HIGH (ref 1.7–7.7)
Neutrophils Relative %: 75 %
Platelets: 681 10*3/uL — ABNORMAL HIGH (ref 150–400)
RBC: 4.41 MIL/uL (ref 4.22–5.81)
RDW: 19 % — ABNORMAL HIGH (ref 11.5–15.5)
WBC: 20.8 10*3/uL — ABNORMAL HIGH (ref 4.0–10.5)
nRBC: 0 % (ref 0.0–0.2)

## 2021-12-16 LAB — BASIC METABOLIC PANEL
Anion gap: 10 (ref 5–15)
BUN: 21 mg/dL (ref 8–23)
CO2: 25 mmol/L (ref 22–32)
Calcium: 8.4 mg/dL — ABNORMAL LOW (ref 8.9–10.3)
Chloride: 106 mmol/L (ref 98–111)
Creatinine, Ser: 1.13 mg/dL (ref 0.61–1.24)
GFR, Estimated: >60 mL/min (ref >60)
Glucose, Bld: 185 mg/dL — ABNORMAL HIGH (ref 70–99)
Potassium: 3.5 mmol/L (ref 3.5–5.1)
Sodium: 141 mmol/L (ref 135–145)

## 2021-12-16 LAB — POC OCCULT BLOOD, ED: Fecal Occult Bld: NEGATIVE

## 2021-12-16 LAB — LACTIC ACID, PLASMA
Lactic Acid, Venous: 1.5 mmol/L (ref 0.5–1.9)
Lactic Acid, Venous: 1.3 mmol/L (ref 0.5–1.9)

## 2021-12-16 LAB — TROPONIN I (HIGH SENSITIVITY)
Troponin I (High Sensitivity): 14 ng/L (ref <18)
Troponin I (High Sensitivity): 13 ng/L (ref <18)

## 2021-12-16 LAB — GLUCOSE, CAPILLARY: Glucose-Capillary: 199 mg/dL — ABNORMAL HIGH (ref 70–99)

## 2021-12-16 LAB — SARS CORONAVIRUS 2 BY RT PCR: SARS Coronavirus 2 by RT PCR: NEGATIVE

## 2021-12-16 LAB — BRAIN NATRIURETIC PEPTIDE: B Natriuretic Peptide: 146 pg/mL — ABNORMAL HIGH (ref 0.0–100.0)

## 2021-12-16 MED ORDER — TRAZODONE HCL 50 MG PO TABS
50.0000 mg | ORAL_TABLET | Freq: Every evening | ORAL | Status: DC | PRN
  Administered 2021-12-18 – 2021-12-27 (×6): 50 mg via ORAL
  Filled 2021-12-16 (×6): qty 1

## 2021-12-16 MED ORDER — ACETAMINOPHEN 325 MG PO TABS
650.0000 mg | ORAL_TABLET | Freq: Four times a day (QID) | ORAL | Status: DC | PRN
  Administered 2021-12-17 – 2021-12-26 (×11): 650 mg via ORAL
  Filled 2021-12-16 (×11): qty 2

## 2021-12-16 MED ORDER — BISACODYL 10 MG RE SUPP: 10.0000 mg | Freq: Every day | RECTAL | Status: DC | PRN

## 2021-12-16 MED ORDER — RAMIPRIL 5 MG PO CAPS
10.0000 mg | ORAL_CAPSULE | Freq: Two times a day (BID) | ORAL | Status: DC
  Administered 2021-12-16 – 2021-12-18 (×5): 10 mg via ORAL
  Filled 2021-12-16 (×2): qty 1
  Filled 2021-12-16: qty 2
  Filled 2021-12-16: qty 1
  Filled 2021-12-16 (×2): qty 2
  Filled 2021-12-16: qty 1
  Filled 2021-12-16 (×2): qty 2

## 2021-12-16 MED ORDER — ONDANSETRON HCL 4 MG/2ML IJ SOLN: 4.0000 mg | Freq: Four times a day (QID) | INTRAMUSCULAR | Status: DC | PRN

## 2021-12-16 MED ORDER — SODIUM CHLORIDE 0.9% FLUSH
3.0000 mL | Freq: Two times a day (BID) | INTRAVENOUS | Status: DC
  Administered 2021-12-17 – 2021-12-26 (×15): 3 mL via INTRAVENOUS

## 2021-12-16 MED ORDER — IPRATROPIUM-ALBUTEROL 0.5-2.5 (3) MG/3ML IN SOLN
3.0000 mL | Freq: Four times a day (QID) | RESPIRATORY_TRACT | Status: DC
  Administered 2021-12-16 – 2021-12-18 (×7): 3 mL via RESPIRATORY_TRACT
  Filled 2021-12-16 (×7): qty 3

## 2021-12-16 MED ORDER — DOXYCYCLINE HYCLATE 100 MG PO TABS
100.0000 mg | ORAL_TABLET | Freq: Once | ORAL | Status: AC
Start: 2021-12-16 — End: 2021-12-16
  Administered 2021-12-16: 100 mg via ORAL
  Filled 2021-12-16: qty 1

## 2021-12-16 MED ORDER — PANTOPRAZOLE SODIUM 40 MG PO TBEC
40.0000 mg | DELAYED_RELEASE_TABLET | Freq: Every day | ORAL | Status: DC
  Administered 2021-12-16 – 2021-12-19 (×4): 40 mg via ORAL
  Filled 2021-12-16 (×4): qty 1

## 2021-12-16 MED ORDER — SODIUM CHLORIDE 0.9 % IV SOLN: INTRAVENOUS | Status: DC

## 2021-12-16 MED ORDER — SODIUM CHLORIDE 0.9 % IV SOLN
1.0000 g | Freq: Once | INTRAVENOUS | Status: AC
Start: 2021-12-16 — End: 2021-12-16
  Administered 2021-12-16: 1 g via INTRAVENOUS
  Filled 2021-12-16: qty 10

## 2021-12-16 MED ORDER — DM-GUAIFENESIN ER 30-600 MG PO TB12
1.0000 | ORAL_TABLET | Freq: Two times a day (BID) | ORAL | Status: DC
  Administered 2021-12-16 – 2021-12-27 (×22): 1 via ORAL
  Filled 2021-12-16 (×22): qty 1

## 2021-12-16 MED ORDER — OXYCODONE HCL 5 MG PO TABS
5.0000 mg | ORAL_TABLET | Freq: Four times a day (QID) | ORAL | Status: DC | PRN
  Administered 2021-12-19: 5 mg via ORAL
  Filled 2021-12-16: qty 1

## 2021-12-16 MED ORDER — POLYETHYLENE GLYCOL 3350 17 G PO PACK: 17.0000 g | PACK | Freq: Every day | ORAL | Status: DC | PRN

## 2021-12-16 MED ORDER — EZETIMIBE 10 MG PO TABS
10.0000 mg | ORAL_TABLET | Freq: Every day | ORAL | Status: DC
  Administered 2021-12-17 – 2021-12-19 (×3): 10 mg via ORAL
  Filled 2021-12-16 (×3): qty 1

## 2021-12-16 MED ORDER — APIXABAN 5 MG PO TABS
5.0000 mg | ORAL_TABLET | Freq: Two times a day (BID) | ORAL | Status: DC
  Administered 2021-12-16 – 2021-12-19 (×6): 5 mg via ORAL
  Filled 2021-12-16 (×6): qty 1

## 2021-12-16 MED ORDER — INSULIN ASPART 100 UNIT/ML IJ SOLN
0.0000 [IU] | Freq: Three times a day (TID) | INTRAMUSCULAR | Status: DC
  Administered 2021-12-17: 2 [IU] via SUBCUTANEOUS
  Administered 2021-12-17: 4 [IU] via SUBCUTANEOUS
  Administered 2021-12-17 – 2021-12-20 (×10): 2 [IU] via SUBCUTANEOUS
  Administered 2021-12-21: 1 [IU] via SUBCUTANEOUS
  Administered 2021-12-21: 2 [IU] via SUBCUTANEOUS
  Administered 2021-12-21 – 2021-12-22 (×3): 1 [IU] via SUBCUTANEOUS
  Administered 2021-12-22: 3 [IU] via SUBCUTANEOUS
  Administered 2021-12-23: 1 [IU] via SUBCUTANEOUS
  Administered 2021-12-25 – 2021-12-26 (×2): 2 [IU] via SUBCUTANEOUS
  Administered 2021-12-26: 1 [IU] via SUBCUTANEOUS

## 2021-12-16 MED ORDER — SODIUM CHLORIDE 0.9 % IV SOLN
1.0000 g | INTRAVENOUS | Status: DC
  Administered 2021-12-17 – 2021-12-18 (×3): 1 g via INTRAVENOUS
  Filled 2021-12-16 (×3): qty 10

## 2021-12-16 MED ORDER — SODIUM CHLORIDE 0.9% FLUSH: 3.0000 mL | INTRAVENOUS | Status: DC | PRN

## 2021-12-16 MED ORDER — INSULIN ASPART 100 UNIT/ML IJ SOLN
0.0000 [IU] | Freq: Every day | INTRAMUSCULAR | Status: DC
  Administered 2021-12-17 – 2021-12-18 (×2): 3 [IU] via SUBCUTANEOUS
  Administered 2021-12-19 – 2021-12-24 (×3): 2 [IU] via SUBCUTANEOUS

## 2021-12-16 MED ORDER — DOXYCYCLINE HYCLATE 100 MG PO TABS
100.0000 mg | ORAL_TABLET | Freq: Two times a day (BID) | ORAL | Status: DC
  Administered 2021-12-16 – 2021-12-19 (×7): 100 mg via ORAL
  Filled 2021-12-16 (×7): qty 1

## 2021-12-16 MED ORDER — SODIUM CHLORIDE 0.9% FLUSH
3.0000 mL | Freq: Two times a day (BID) | INTRAVENOUS | Status: DC
  Administered 2021-12-16 – 2021-12-26 (×7): 3 mL via INTRAVENOUS

## 2021-12-16 MED ORDER — ACETAMINOPHEN 500 MG PO TABS
1000.0000 mg | ORAL_TABLET | Freq: Once | ORAL | Status: AC
Start: 2021-12-16 — End: 2021-12-16
  Administered 2021-12-16: 1000 mg via ORAL
  Filled 2021-12-16: qty 2

## 2021-12-16 MED ORDER — DILTIAZEM HCL ER COATED BEADS 240 MG PO CP24
240.0000 mg | ORAL_CAPSULE | Freq: Every day | ORAL | Status: DC
  Administered 2021-12-16 – 2021-12-27 (×12): 240 mg via ORAL
  Filled 2021-12-16 (×12): qty 1

## 2021-12-16 MED ORDER — SODIUM CHLORIDE 0.9 % IV SOLN: INTRAVENOUS | Status: AC

## 2021-12-16 MED ORDER — ALBUTEROL SULFATE (2.5 MG/3ML) 0.083% IN NEBU
2.5000 mg | INHALATION_SOLUTION | RESPIRATORY_TRACT | Status: DC | PRN
  Administered 2021-12-18 – 2021-12-23 (×3): 2.5 mg via RESPIRATORY_TRACT
  Filled 2021-12-16 (×4): qty 3

## 2021-12-16 MED ORDER — METOPROLOL SUCCINATE ER 50 MG PO TB24
50.0000 mg | ORAL_TABLET | Freq: Every day | ORAL | Status: DC
  Administered 2021-12-17 – 2021-12-19 (×3): 50 mg via ORAL
  Filled 2021-12-16 (×3): qty 1

## 2021-12-16 MED ORDER — ONDANSETRON HCL 4 MG PO TABS: 4.0000 mg | ORAL_TABLET | Freq: Four times a day (QID) | ORAL | Status: DC | PRN

## 2021-12-16 MED ORDER — PREGABALIN 50 MG PO CAPS
100.0000 mg | ORAL_CAPSULE | Freq: Two times a day (BID) | ORAL | Status: DC
  Administered 2021-12-16 – 2021-12-27 (×22): 100 mg via ORAL
  Filled 2021-12-16 (×22): qty 2

## 2021-12-16 MED ORDER — SODIUM CHLORIDE 0.9 % IV SOLN: INTRAVENOUS | Status: DC | PRN

## 2021-12-16 MED ORDER — ACETAMINOPHEN 650 MG RE SUPP: 650.0000 mg | Freq: Four times a day (QID) | RECTAL | Status: DC | PRN

## 2021-12-16 NOTE — H&P (Addendum)
Patient Demographics:    Steve Jensen, is a 81 y.o. male  MRN: [id number]   DOB - 1940/06/29  Admit Date - 12/16/2021  Outpatient Primary MD for the patient is [name], MD   Assessment & Plan:   Assessment and Plan:  1) sepsis secondary to left-sided community-acquired pneumonia---POA -Sepsis criteria on admission with fevers, leukocytosis tachycardia tachypnea and pneumonia -Treat empirically with Rocephin and doxycycline pending blood culture data -Bronchial dilators and mucolytics as ordered --WBC 20.8, lactic acid 1.5 -Required oxygen transiently in the ED , weaned off oxygen at this time  2)HFpEF--patient with chronic diastolic dysfunction CHF , no acute exacerbation at this time -Echo from 02/12/2021 with EF of 50 to 56% and diastolic dysfunction --BNP 146 -Initial troponin is 14, EKG sinus tach with PVCs -Clinically and radiologically appears compensated -Hold Lasix for now given sepsis pathophysiology  3)PAF--- status post prior cardioversion... Currently in sinus tachycardia -Continue metoprolol and Cardizem for rate control -Eliquis for stroke prophylaxis  4)Morbid obesity/OSA------ CPAP nightly as advised -Low calorie diet, portion control and increase physical activity discussed with patient -Body mass index is 40.45 kg/m.  5) acute on chronic anemia---Hemoglobin 9.0, which is lower than prior baseline, MCV and MCH are low stool occult blood is negative -No obvious bleeding concerns at this time -Monitor closely as patient is on Eliquis  6) social/ethics--plan of care and advanced directive discussed with patient and daughter-in-law at bedside -Patient is a full code  7)DM2-no recent A1c -Previously uncontrolled Hold metformin Use Novolog/Humalog Sliding scale insulin with  Accu-Cheks/Fingersticks as ordered   8) advanced osteoarthritis of the right knee--patient was scheduled to see orthopedic consultant in [place] on 12/16/21 with goal to probably plan a knee replacement -Outpatient follow-up with orthopedics advised  Disposition/Need for in-Hospital Stay- patient unable to be discharged at this time due to --sepsis secondary to pneumonia requiring IV antibiotics*  Status is: Inpatient  Remains inpatient appropriate because:   Dispo: The patient is from: Home              Anticipated d/c is to: Home              Anticipated d/c date is: 2 days              Patient currently is not medically stable to d/c. Barriers: Not Clinically Stable-   With History of - Reviewed by me  Past Medical History:  Diagnosis Date   Anemia    Diabetes mellitus type II, controlled ([place])    with neuropathy   Dysrhythmia    A-Fib. cardioversion done   GERD (gastroesophageal reflux disease)    Hyperlipidemia    Hypertension    Morbid obesity ([place])    Neuromuscular disorder ([place])    feet   OSA (obstructive sleep apnea)    On BiPAP at 15/11cm H2O   Prostate cancer ([place])    Shingles    on face Nov 2010  Urinary incontinence       Past Surgical History:  Procedure Laterality Date   CARDIOVERSION N/A 09/06/2013   Procedure: CARDIOVERSION;  Surgeon: [name], MD;  Location: [facility] ENDOSCOPY;  Service: Cardiovascular;  Laterality: N/A;   EYE SURGERY     cataract surgery bilat; surgery to correct droopy eyelids    IR ANGIOGRAM SELECTIVE EACH ADDITIONAL VESSEL  02/15/2018   IR ANGIOGRAM SELECTIVE EACH ADDITIONAL VESSEL  02/15/2018   IR ANGIOGRAM SELECTIVE EACH ADDITIONAL VESSEL  02/15/2018   IR ANGIOGRAM SELECTIVE EACH ADDITIONAL VESSEL  02/15/2018   IR ANGIOGRAM SELECTIVE EACH ADDITIONAL VESSEL  02/15/2018   IR ANGIOGRAM VISCERAL SELECTIVE  02/15/2018   IR ANGIOGRAM VISCERAL SELECTIVE  02/15/2018   IR EMBO TUMOR ORGAN ISCHEMIA INFARCT INC GUIDE ROADMAPPING   02/15/2018   IR RADIOLOGIST EVAL & MGMT  01/26/2018   IR RADIOLOGIST EVAL & MGMT  03/17/2018   IR RADIOLOGIST EVAL & MGMT  06/29/2018   IR RADIOLOGIST EVAL & MGMT  07/27/2018   IR RADIOLOGIST EVAL & MGMT  10/28/2018   IR RADIOLOGIST EVAL & MGMT  12/14/2018   IR RADIOLOGIST EVAL & MGMT  03/17/2019   IR RADIOLOGIST EVAL & MGMT  07/14/2019   IR RADIOLOGIST EVAL & MGMT  12/27/2019   IR RADIOLOGIST EVAL & MGMT  08/21/2020   IR RADIOLOGIST EVAL & MGMT  02/28/2021   IR RADIOLOGIST EVAL & MGMT  06/25/2021   IR RADIOLOGIST EVAL & MGMT  10/18/2021   IR US GUIDE VASC ACCESS RIGHT  02/15/2018   knee surgery bilat      PROSTATE SURGERY     RADIOLOGY WITH ANESTHESIA N/A 11/24/2018   Procedure: MICROWAVE THERMAL ABLATION LIVER;  Surgeon: Sandi Mariscal, MD;  Location: [facility] ORS;  Service: Anesthesiology;  Laterality: N/A;   right shoulder rotator cuff surgery     TONSILLECTOMY      Chief Complaint  Patient presents with   Knee Pain      HPI:    Steve Jensen  is a 81 y.o. male reformed smoker with past medical history relevant for morbid obesity/OSA, chronic A-fib status post prior cardioversion chronically on Eliquis for stroke prophylaxis, HTN, chronic diastolic dysfunction CHF and advanced osteoarthritis as well as diabetes mellitus who presents to the ED with complaints of productive cough with colored sputum, shortness of breath and dyspnea on exertion since 12/13/2021. -Additional history obtained from daughter-in-law Ms. Beverly at bedside Patient also had fevers and chills in the ED temp is 101.5 =-No frank chest pains no pleuritic symptoms, no leg swelling or orthopnea -Chest x-ray suggestive of left-sided pneumonia -BNP 146 -Initial troponin is 14, EKG sinus tach with PVCs -WBC 20.8, lactic acid 1.5 -Hemoglobin 9.0, which is lower than prior baseline, MCV and MCH are low stool occult blood is negative -Creatinine 1.1 potassium 3.5 sodium 141  Review of systems:    In addition to the HPI above,   A full  Review of  Systems was done, all other systems reviewed are negative except as noted above in HPI , .    Social History:  Reviewed by me    Social History   Tobacco Use   Smoking status: Former    Packs/day: 1.00    Years: 15.00    Total pack years: 15.00    Types: Cigarettes    Quit date: 05/27/1975    Years since quitting: 46.5   Smokeless tobacco: Never  Substance Use Topics   Alcohol use: No  Family History :  Reviewed by me    Family History  Problem Relation Age of Onset   Hypertension Brother    Diabetes Brother    Cancer Brother        bladder cancer   Diabetes Brother    CVA Brother    Hypertension Brother    Prostate cancer Brother    Diabetes Brother    Hypertension Brother    Heart disease Brother        CABG   Heart attack Brother    Stroke Brother      Home Medications:   Prior to Admission medications   Medication Sig Start Date End Date Taking? Authorizing Provider  allopurinol (ZYLOPRIM) 100 MG tablet Take 100 mg by mouth daily. 04/17/21  Yes [provider]  apixaban (ELIQUIS) 5 MG TABS tablet TAKE 1 TABLET(5 MG) BY MOUTH TWICE DAILY 11/15/18  Yes Turner, Traci R, MD  CINNAMON PO Take 1,000 mg by mouth daily.   Yes [provider]  diltiazem (CARDIZEM CD) 240 MG 24 hr capsule TAKE 1 CAPSULE BY MOUTH  DAILY 09/09/21  Yes [name], MD  fish oil-omega-3 fatty acids 1000 MG capsule Take 2 g by mouth 2 (two) times daily.    Yes [provider]  furosemide (LASIX) 40 MG tablet TAKE 1 TABLET BY MOUTH TWICE  DAILY AS NEEDED 11/04/21  Yes [name], MD  ibuprofen (ADVIL,MOTRIN) 200 MG tablet Take 800 mg by mouth every 6 (six) hours as needed (PAIN). prn 06/30/13  Yes [provider]  loratadine (CLARITIN) 10 MG tablet Take 10 mg by mouth daily as needed for allergies.    Yes [provider]  metFORMIN (GLUCOPHAGE) 1000 MG tablet Take 1,000 mg by mouth 2 (two) times daily. 06/27/14  Yes  [provider]  metoprolol succinate (TOPROL-XL) 50 MG 24 hr tablet Take 50 mg by mouth daily.  08/10/13  Yes [provider]  Multiple Vitamins-Minerals (MULTIVITAMIN WITH MINERALS) tablet Take 1 tablet by mouth daily.   Yes [provider]  omeprazole (PRILOSEC) 20 MG capsule Take 20 mg by mouth daily.  11/29/12  Yes [provider]  oxymetazoline (AFRIN) 0.05 % nasal spray Place 1 spray into both nostrils 2 (two) times daily as needed for congestion.   Yes [provider]  potassium chloride SA (KLOR-CON M) 20 MEQ tablet TAKE 1 TABLET BY MOUTH  DAILY AS NEEDED WHILE  TAKING FUROSEMIDE Patient taking differently: Take 20 mEq by mouth daily. 06/28/21  Yes [name], MD  pregabalin (LYRICA) 100 MG capsule Take 100 mg by mouth 2 (two) times a day.  09/14/18  Yes [provider]  ramipril (ALTACE) 10 MG capsule Take 10 mg by mouth 2 (two) times daily. 01/11/13  Yes [provider]  tirzepatide Darcel Bayley) 5 MG/0.5ML Pen Inject 5 mg into the skin once a week.   Yes [provider]  ZETIA 10 MG tablet Take 10 mg by mouth daily.  01/17/13  Yes [provider]     Allergies:     Allergies  Allergen Reactions   Lipitor [Atorvastatin] Other (See Comments)    Muscle aches   Simvastatin Other (See Comments)    Muscle aches   Zithromax [Azithromycin] Other (See Comments)    GI-SIDE EFFECTS   Amlodipine Other (See Comments)     Physical Exam:   Vitals  Blood pressure (!) 138/59, pulse (!) 106, temperature 98.4 F (36.9 C), resp. rate  18, height '5\' 11"'$  (1.803 m), weight 131.5 kg, SpO2 92 %.  Physical Examination: General appearance - alert,  in no distress Mental status - alert, oriented to person, place, and time,  Eyes - sclera anicteric Neck - supple, no JVD elevation , Chest -diminished on the left with scattered rhonchi in left base  heart - S1 and S2 normal, irregular - abdomen - soft, nontender,  nondistended, +BS, increased truncal adiposity Neurological - screening mental status exam normal, neck supple without rigidity, cranial nerves II through XII intact, DTR's normal and symmetric Extremities - no pedal edema noted, intact peripheral pulses  Skin - warm, dry MSK-Rt knee pain with range of motion and  palpation     Data Review:    CBC Recent Labs  Lab 12/16/21 1816  WBC 20.8*  HGB 9.0*  HCT 29.3*  PLT 681*  MCV 66.4*  MCH 20.4*  MCHC 30.7  RDW 19.0*  LYMPHSABS 2.3  MONOABS 2.4*  EOSABS 0.1  BASOSABS 0.1   ------------------------------------------------------------------------------------------------------------------  Chemistries  Recent Labs  Lab 12/16/21 1816  NA 141  K 3.5  CL 106  CO2 25  GLUCOSE 185*  BUN 21  CREATININE 1.13  CALCIUM 8.4*   ------------------------------------------------------------------------------------------------------------------ estimated creatinine clearance is 73.3 mL/min (by C-G formula based on SCr of 1.13 mg/dL). ------------------------------------------------------------------------------------------------------------------ No results for input(s): "TSH", "T4TOTAL", "T3FREE", "THYROIDAB" in the last 72 hours.  Invalid input(s): "FREET3"   Coagulation profile No results for input(s): "INR", "PROTIME" in the last 168 hours. ------------------------------------------------------------------------------------------------------------------- No results for input(s): "DDIMER" in the last 72 hours. -------------------------------------------------------------------------------------------------------------------  Cardiac Enzymes No results for input(s): "CKMB", "TROPONINI", "MYOGLOBIN" in the last 168 hours.  Invalid input(s): "CK" ------------------------------------------------------------------------------------------------------------------    Component Value Date/Time   BNP 146.0 (H) 12/16/2021 1816      ---------------------------------------------------------------------------------------------------------------  Urinalysis No results found for: "COLORURINE", "APPEARANCEUR", "LABSPEC", "PHURINE", "GLUCOSEU", "HGBUR", "BILIRUBINUR", "KETONESUR", "PROTEINUR", "UROBILINOGEN", "NITRITE", "LEUKOCYTESUR"  ----------------------------------------------------------------------------------------------------------------   Imaging Results:    DG Chest Port 1 View  Result Date: 12/16/2021 CLINICAL DATA:  Shortness of breath EXAM: PORTABLE CHEST 1 VIEW COMPARISON:  02/28/2021 FINDINGS: Cardiac size upper limits of normal. Slight central congestion. No pleural effusion. Patchy airspace disease at the lingula and left base. No pneumothorax IMPRESSION: Patchy airspace disease at the lingula and left base, atelectasis versus pneumonia. Electronically Signed   By: Donavan Foil M.D.   On: 12/16/2021 18:34    Radiological Exams on Admission: DG Chest Port 1 View  Result Date: 12/16/2021 CLINICAL DATA:  Shortness of breath EXAM: PORTABLE CHEST 1 VIEW COMPARISON:  02/28/2021 FINDINGS: Cardiac size upper limits of normal. Slight central congestion. No pleural effusion. Patchy airspace disease at the lingula and left base. No pneumothorax IMPRESSION: Patchy airspace disease at the lingula and left base, atelectasis versus pneumonia. Electronically Signed   By: Donavan Foil M.D.   On: 12/16/2021 18:34    DVT Prophylaxis -SCD/Eliquis AM Labs Ordered, also please review Full Orders  Family Communication: Admission, patients condition and plan of care including tests being ordered have been discussed with the patient and daughter-in-law Rise Paganini who indicate understanding and agree with the plan   Condition -stable  Roxan Hockey M.D on 12/16/2021 at 10:16 PM Go to [email or web address] -  for contact info  [facility] - Office  [phone]

## 2021-12-16 NOTE — ED Provider Notes (Signed)
[facility] EMERGENCY DEPARTMENT Provider Note   CSN: [id number] Arrival date & time: 12/16/21  1506     History {Add pertinent medical, surgical, social history, OB history to HPI:1} Chief Complaint  Patient presents with   Knee Pain    Steve Jensen is a 81 y.o. male.  He has a history of CHF A-fib on anticoagulation, diuretics, hypertension, diabetes.  He has significant arthritis of his right knee and has been putting off surgery on it.  His right knee has been bothering him more recently especially with ambulation.  Said he gets spasms in his whole leg.  Also has had increased cough chest congestion shortness of breath dyspnea on exertion over the past 2 weeks.  No known fevers.  Has been taking his diuretics without improvement.  Non-smoker.  States his daily weights have not been increasing.  The history is provided by the patient.  Knee Pain Location:  Knee Injury: no   Knee location:  R knee Pain details:    Quality:  Aching, sharp and shooting   Radiates to:  R leg   Severity:  Severe   Onset quality:  Gradual   Timing:  Intermittent   Progression:  Unchanged Chronicity:  Chronic Associated symptoms: no fever   Shortness of Breath Severity:  Moderate Onset quality:  Gradual Duration:  2 weeks Timing:  Constant Progression:  Unchanged Chronicity:  New Relieved by:  Nothing Worsened by:  Activity and coughing Ineffective treatments:  Diuretics Associated symptoms: cough and wheezing   Associated symptoms: no abdominal pain, no chest pain, no fever, no headaches, no hemoptysis, no rash, no sore throat, no sputum production and no vomiting   Risk factors: no tobacco use        Home Medications Prior to Admission medications   Medication Sig Start Date End Date Taking? Authorizing Provider  allopurinol (ZYLOPRIM) 100 MG tablet Take 100 mg by mouth daily. 04/17/21   [provider]  apixaban (ELIQUIS) 5 MG TABS tablet TAKE 1 TABLET(5 MG) BY MOUTH  TWICE DAILY 11/15/18   [name], MD  CINNAMON PO Take 1,000 mg by mouth daily.    [provider]  diltiazem (CARDIZEM CD) 240 MG 24 hr capsule TAKE 1 CAPSULE BY MOUTH  DAILY 09/09/21   [name], MD  fish oil-omega-3 fatty acids 1000 MG capsule Take 2 g by mouth 2 (two) times daily.     [provider]  furosemide (LASIX) 40 MG tablet TAKE 1 TABLET BY MOUTH TWICE  DAILY AS NEEDED 11/04/21   [name], MD  ibuprofen (ADVIL,MOTRIN) 200 MG tablet Take 800 mg by mouth every 6 (six) hours as needed (PAIN). prn 06/30/13   [provider]  loratadine (CLARITIN) 10 MG tablet Take 10 mg by mouth daily as needed for allergies.     [provider]  metFORMIN (GLUCOPHAGE) 1000 MG tablet Take 1,000 mg by mouth 2 (two) times daily. 06/27/14   [provider]  metoprolol succinate (TOPROL-XL) 50 MG 24 hr tablet Take 50 mg by mouth daily.  08/10/13   [provider]  Multiple Vitamins-Minerals (MULTIVITAMIN WITH MINERALS) tablet Take 1 tablet by mouth daily.    [provider]  omeprazole (PRILOSEC) 20 MG capsule Take 20 mg by mouth daily.  11/29/12   [provider]  oxymetazoline (AFRIN) 0.05 % nasal spray Place 1 spray into both nostrils 2 (two) times daily as needed for congestion.    [provider]  potassium chloride SA (KLOR-CON M) 20 MEQ tablet TAKE 1 TABLET BY MOUTH  DAILY AS NEEDED WHILE  TAKING FUROSEMIDE 06/28/21   [name], MD  pregabalin (LYRICA) 100 MG capsule Take 100 mg by mouth 2 (two) times a day.  09/14/18   [provider]  ramipril (ALTACE) 10 MG capsule Take 10 mg by mouth 2 (two) times daily. 01/11/13   [provider]  Semaglutide, 1 MG/DOSE, 2 MG/1.5ML SOPN Inject 3 mg into the skin once a week. Monday    [provider]  tirzepatide Darcel Bayley) 5 MG/0.5ML Pen Inject 5 mg into the skin once a week.    [provider]  ZETIA 10 MG tablet Take 10 mg  by mouth daily.  01/17/13   [provider]      Allergies    Lipitor [atorvastatin], Simvastatin, Zithromax [azithromycin], and Amlodipine    Review of Systems   Review of Systems  Constitutional:  Negative for fever.  HENT:  Negative for sore throat.   Respiratory:  Positive for cough, shortness of breath and wheezing. Negative for hemoptysis and sputum production.   Cardiovascular:  Negative for chest pain.  Gastrointestinal:  Negative for abdominal pain, nausea and vomiting.  Genitourinary:  Negative for dysuria.  Musculoskeletal:  Positive for gait problem.  Skin:  Negative for rash.  Neurological:  Negative for headaches.    Physical Exam Updated Vital Signs BP 130/63   Pulse 100   Temp 98.5 F (36.9 C) (Oral)   Resp 17   Ht '5\' 11"'$  (1.803 m)   Wt 131.5 kg   SpO2 91%   BMI 40.45 kg/m  Physical Exam Vitals and nursing note reviewed.  Constitutional:      General: He is not in acute distress.    Appearance: Normal appearance. He is well-developed.  HENT:     Head: Normocephalic and atraumatic.  Eyes:     Conjunctiva/sclera: Conjunctivae normal.  Cardiovascular:     Rate and Rhythm: Normal rate. Rhythm irregular.     Heart sounds: No murmur heard. Pulmonary:     Effort: Pulmonary effort is normal. No respiratory distress.     Breath sounds: Wheezing present.  Abdominal:     Palpations: Abdomen is soft.     Tenderness: There is no abdominal tenderness. There is no guarding or rebound.  Musculoskeletal:        General: Tenderness (r knee) present.     Cervical back: Neck supple.     Right lower leg: No edema.     Left lower leg: No edema.  Skin:    General: Skin is warm and dry.     Capillary Refill: Capillary refill takes less than 2 seconds.  Neurological:     General: No focal deficit present.     Mental Status: He is alert.     ED Results / Procedures / Treatments   Labs (all labs ordered are listed, but only abnormal results are  displayed) Labs Reviewed - No data to display  EKG None  Radiology No results found.  Procedures Procedures  {Document cardiac monitor, telemetry assessment procedure when appropriate:1}  Medications Ordered in ED Medications - No data to display  ED Course/ Medical Decision Making/ A&P                           Medical Decision Making Amount and/or Complexity of Data Reviewed Labs: ordered. Radiology: ordered.  This patient complains of ***;  this involves an extensive number of treatment Options and is a complaint that carries with it a high risk of complications and morbidity. The differential includes ***  I ordered, reviewed and interpreted labs, which included *** I ordered medication *** and reviewed PMP when indicated. I ordered imaging studies which included *** and I independently    visualized and interpreted imaging which showed *** Additional history obtained from *** Previous records obtained and reviewed *** I consulted *** and discussed lab and imaging findings and discussed disposition.  Cardiac monitoring reviewed, *** Social determinants considered, *** Critical Interventions: ***  After the interventions stated above, I reevaluated the patient and found *** Admission and further testing considered, ***    {Document critical care time when appropriate:1} {Document review of labs and clinical decision tools ie heart score, Chads2Vasc2 etc:1}  {Document your independent review of radiology images, and any outside records:1} {Document your discussion with family members, caretakers, and with consultants:1} {Document social determinants of health affecting pt's care:1} {Document your decision making why or why not admission, treatments were needed:1} Final Clinical Impression(s) / ED Diagnoses Final diagnoses:  None    Rx / DC Orders ED Discharge Orders     None

## 2021-12-16 NOTE — ED Triage Notes (Signed)
Pt called [facility] because pt was not able to get up. Pt uses a walker to ambulate. Pt is supposed to get right knee replacement tomorrow.

## 2021-12-17 DIAGNOSIS — I10 Essential (primary) hypertension: Secondary | ICD-10-CM

## 2021-12-17 DIAGNOSIS — I480 Paroxysmal atrial fibrillation: Secondary | ICD-10-CM | POA: Diagnosis not present

## 2021-12-17 DIAGNOSIS — G4733 Obstructive sleep apnea (adult) (pediatric): Secondary | ICD-10-CM

## 2021-12-17 DIAGNOSIS — R6520 Severe sepsis without septic shock: Secondary | ICD-10-CM

## 2021-12-17 DIAGNOSIS — E1165 Type 2 diabetes mellitus with hyperglycemia: Secondary | ICD-10-CM | POA: Diagnosis present

## 2021-12-17 DIAGNOSIS — M1711 Unilateral primary osteoarthritis, right knee: Secondary | ICD-10-CM | POA: Diagnosis present

## 2021-12-17 DIAGNOSIS — J189 Pneumonia, unspecified organism: Secondary | ICD-10-CM | POA: Diagnosis not present

## 2021-12-17 LAB — GLUCOSE, CAPILLARY
Glucose-Capillary: 203 mg/dL — ABNORMAL HIGH (ref 70–99)
Glucose-Capillary: 312 mg/dL — ABNORMAL HIGH (ref 70–99)
Glucose-Capillary: 244 mg/dL — ABNORMAL HIGH (ref 70–99)
Glucose-Capillary: 252 mg/dL — ABNORMAL HIGH (ref 70–99)

## 2021-12-17 LAB — CBC
HCT: 27.8 % — ABNORMAL LOW (ref 39.0–52.0)
Hemoglobin: 8.5 g/dL — ABNORMAL LOW (ref 13.0–17.0)
MCH: 20.3 pg — ABNORMAL LOW (ref 26.0–34.0)
MCHC: 30.6 g/dL (ref 30.0–36.0)
MCV: 66.5 fL — ABNORMAL LOW (ref 80.0–100.0)
Platelets: 633 10*3/uL — ABNORMAL HIGH (ref 150–400)
RBC: 4.18 MIL/uL — ABNORMAL LOW (ref 4.22–5.81)
RDW: 18.8 % — ABNORMAL HIGH (ref 11.5–15.5)
WBC: 20.7 10*3/uL — ABNORMAL HIGH (ref 4.0–10.5)
nRBC: 0 % (ref 0.0–0.2)

## 2021-12-17 LAB — BASIC METABOLIC PANEL
Anion gap: 10 (ref 5–15)
BUN: 18 mg/dL (ref 8–23)
CO2: 23 mmol/L (ref 22–32)
Calcium: 8.2 mg/dL — ABNORMAL LOW (ref 8.9–10.3)
Chloride: 106 mmol/L (ref 98–111)
Creatinine, Ser: 1.17 mg/dL (ref 0.61–1.24)
GFR, Estimated: >60 mL/min (ref >60)
Glucose, Bld: 199 mg/dL — ABNORMAL HIGH (ref 70–99)
Potassium: 3.3 mmol/L — ABNORMAL LOW (ref 3.5–5.1)
Sodium: 139 mmol/L (ref 135–145)

## 2021-12-17 LAB — HEMOGLOBIN A1C
Hgb A1c MFr Bld: 7.6 % — ABNORMAL HIGH (ref 4.8–5.6)
Mean Plasma Glucose: 171.42 mg/dL

## 2021-12-17 LAB — MAGNESIUM: Magnesium: 1.9 mg/dL (ref 1.7–2.4)

## 2021-12-17 MED ORDER — INSULIN ASPART 100 UNIT/ML IJ SOLN
4.0000 [IU] | Freq: Three times a day (TID) | INTRAMUSCULAR | Status: DC
  Administered 2021-12-17 – 2021-12-27 (×21): 4 [IU] via SUBCUTANEOUS

## 2021-12-17 MED ORDER — DICLOFENAC SODIUM 1 % EX GEL
4.0000 g | Freq: Four times a day (QID) | CUTANEOUS | Status: DC
  Administered 2021-12-17 – 2021-12-20 (×10): 4 g via TOPICAL
  Filled 2021-12-17 (×2): qty 100

## 2021-12-17 MED ORDER — BUDESONIDE 0.5 MG/2ML IN SUSP
0.5000 mg | Freq: Two times a day (BID) | RESPIRATORY_TRACT | Status: DC
Start: 2021-12-17 — End: 2021-12-27
  Administered 2021-12-17 – 2021-12-27 (×20): 0.5 mg via RESPIRATORY_TRACT
  Filled 2021-12-17 (×20): qty 2

## 2021-12-17 MED ORDER — POTASSIUM CHLORIDE CRYS ER 20 MEQ PO TBCR
40.0000 meq | EXTENDED_RELEASE_TABLET | ORAL | Status: AC
  Administered 2021-12-17 (×2): 40 meq via ORAL
  Filled 2021-12-17 (×2): qty 2

## 2021-12-17 MED ORDER — GUAIFENESIN-DM 100-10 MG/5ML PO SYRP
5.0000 mL | ORAL_SOLUTION | ORAL | Status: DC | PRN
  Administered 2021-12-17 – 2021-12-22 (×7): 5 mL via ORAL
  Filled 2021-12-17 (×7): qty 5

## 2021-12-17 MED ORDER — TRAMADOL HCL 50 MG PO TABS
50.0000 mg | ORAL_TABLET | Freq: Three times a day (TID) | ORAL | Status: DC | PRN
  Administered 2021-12-18 – 2021-12-25 (×3): 50 mg via ORAL
  Filled 2021-12-17 (×3): qty 1

## 2021-12-17 NOTE — Assessment & Plan Note (Signed)
-   Update A1c -Continue sliding scale insulin; NovoLog for meal coverage added. -Follow CBGs fluctuation and adjust hypoglycemic regimen as required -While inpatient continue holding oral hypoglycemic agents.

## 2021-12-17 NOTE — Inpatient Diabetes Management (Signed)
Inpatient Diabetes Program Recommendations  AACE/ADA: New Consensus Statement on Inpatient Glycemic Control   Target Ranges:  Prepandial:   less than 140 mg/dL      Peak postprandial:   less than 180 mg/dL (1-2 hours)      Critically ill patients:  140 - 180 mg/dL    Latest Reference Range & Units 12/16/21 23:28 12/17/21 08:06 12/17/21 11:37  Glucose-Capillary 70 - 99 mg/dL 199 (H) 203 (H) 312 (H)   Review of Glycemic Control  Diabetes history: DM2 Outpatient Diabetes medications: Metformin 1000 mg BID, Mounjaro 5 mg Qweek Current orders for Inpatient glycemic control: Novolog 0-6 units TID with meals, Novolog 0-5 units QHS  Inpatient Diabetes Program Recommendations:    Insulin: Please consider ordering Novolog 4 units TID with meals for meal coverage if patient eats at least 50% of meals.   Thanks, Barnie Alderman, RN, MSN, [place] Diabetes Coordinator Inpatient Diabetes Program [phone] (Team Pager from 8am to [place])

## 2021-12-17 NOTE — Progress Notes (Signed)
Progress Note   Patient: Steve Jensen FIE:[id number] DOB: [birth year] DOA: 12/16/2021     1 DOS: the patient was seen and examined on 12/17/2021   Brief hospital admission course: As per H&P written by Dr. Denton Brick on 12/16/2021  Steve Jensen  is a 81 y.o. male reformed smoker with past medical history relevant for morbid obesity/OSA, chronic A-fib status post prior cardioversion chronically on Eliquis for stroke prophylaxis, HTN, chronic diastolic dysfunction CHF and advanced osteoarthritis as well as diabetes mellitus who presents to the ED with complaints of productive cough with colored sputum, shortness of breath and dyspnea on exertion since 12/13/2021. -Additional history obtained from daughter-in-law Ms. Beverly at bedside Patient also had fevers and chills in the ED temp is 101.5 =-No frank chest pains no pleuritic symptoms, no leg swelling or orthopnea -Chest x-ray suggestive of left-sided pneumonia -BNP 146 -Initial troponin is 14, EKG sinus tach with PVCs -WBC 20.8, lactic acid 1.5 -Hemoglobin 9.0, which is lower than prior baseline, MCV and MCH are low stool occult blood is negative -Creatinine 1.1 potassium 3.5 sodium 141  Assessment and Plan: * Pneumonia - Presumed to be bacterial -Continue current IV antibiotic -Follow culture results -Wean off oxygen supplementation as tolerated -Continue the use of flutter valve, mucolytic's bronchodilators and supportive care.  Sepsis due to CAP/PNA - Patient met criteria for severe sepsis due to pneumonia at time of admission; febrile, elevated WBCs, tachycardic/tachypneic, source of infection left lower lobe pneumonia and organ dysfunction his lungs with the presence of hypoxia requiring oxygen supplementation. -As mentioned above continue maintaining adequate hydration, IV antibiotics and further treatment of his pneumonia. -Follow culture results.  Osteoarthritis of right knee - Patient complaining of significant pain -Voltaren  gel and as needed tramadol will be added to assist with discomfort -Outpatient follow-up with orthopedic service as he has been advised of the need for knee replacement.  Uncontrolled type 2 diabetes mellitus with hyperglycemia, without long-term current use of insulin (HCC) - Update A1c -Continue sliding scale insulin; NovoLog for meal coverage added. -Follow CBGs fluctuation and adjust hypoglycemic regimen as required -While inpatient continue holding oral hypoglycemic agents.  Atrial fibrillation (HCC) - Paroxysmal in nature -Continue telemetry monitoring -Rate and rhythm stable at this time -Continue metoprolol and Cardizem for rate control -Continue Eliquis for secondary prophylaxis.  Morbid obesity ([place]) -Body mass index is 41.72 kg/m. -Low calorie diet, portion control and increase physical activity discussed with patient.  Obstructive sleep apnea - Continue the use of CPAP nightly  Essential hypertension, benign - Stable overall -Continue current antihypertensive agent -Heart healthy diet discussed with patient.  Hypokalemia -Will check magnesium level and replete electrolytes -Follow trend.  Subjective:  Still spiking fever; no chest pain, no nausea vomiting.  Short of breath with minimal exertion and experiencing productive coughing spells.  Patient is complaining of significant pain in his right knee.  Physical Exam: Vitals:   12/17/21 0210 12/17/21 0429 12/17/21 1335 12/17/21 1453  BP:  131/69 (!) 145/58   Pulse:  100 96   Resp:  19 19   Temp: 98.5 F (36.9 C) (!) 100.8 F (38.2 C) (!) 101.3 F (38.5 C)   TempSrc: Oral Oral Oral   SpO2:  91% 90% 93%  Weight:      Height:       General exam: Alert, awake, oriented x 3; short of breath, requiring oxygen supplementation and is still spiking fever.  Reporting intermittent productive coughing spells and not feeling well. Respiratory system: Mild  difficulty speaking in full sentences; left more than right  rhonchi/Rales appreciated on exam; no crackles.  Patient demonstrating tachypnea and difficulty speaking in full sentences. Cardiovascular system:Rate controlled; no rubs or gallops; no JVD. Gastrointestinal system: Abdomen is obese, nondistended, soft and nontender. No organomegaly or masses felt. Normal bowel sounds heard. Central nervous system: Alert and oriented. No focal neurological deficits. Extremities: No cyanosis or clubbing; decreased range of motion in his right lower extremity due to significant right knee pain.  No erythematous changes or swelling appreciated. Skin: No petechiae. Psychiatry: Judgement and insight appear normal. Mood & affect appropriate.   Data Reviewed: Basic metabolic panel: Sodium 949, potassium 3.3, chloride 106, bicarb 23, BUN 18, creatinine 1.17. CBC: WBCs 20.7, hemoglobin 9.5, MCV 66.5, platelet count 633 K.  Family Communication: No family at bedside.  Disposition: Status is: Inpatient Remains inpatient appropriate because: Receiving treatment for severe sepsis secondary to pneumonia  Planned Discharge Destination: Home   Author: Barton Dubois, MD 12/17/2021 5:21 PM  For on call review [email or web address].

## 2021-12-17 NOTE — Assessment & Plan Note (Signed)
-   Stable overall -Continue current antihypertensive agent -Heart healthy diet discussed with patient. 

## 2021-12-17 NOTE — Assessment & Plan Note (Signed)
-  Body mass index is 41.72 kg/m. -Low calorie diet, portion control and increase physical activity discussed with patient.

## 2021-12-17 NOTE — Assessment & Plan Note (Signed)
-   Patient complaining of significant pain -Voltaren gel and as needed tramadol will be added to assist with discomfort -Outpatient follow-up with orthopedic service as he has been advised of the need for knee replacement.

## 2021-12-17 NOTE — Progress Notes (Signed)
Patient had 3 beats of non-sustained vtach. Night provider notified via secure chat.

## 2021-12-17 NOTE — Assessment & Plan Note (Signed)
-   Paroxysmal in nature -Continue telemetry monitoring -Rate and rhythm stable at this time -Continue metoprolol and Cardizem for rate control -Continue Eliquis for secondary prophylaxis.

## 2021-12-17 NOTE — Assessment & Plan Note (Signed)
-  Patient met criteria for severe sepsis due to pneumonia at time of admission; febrile, elevated WBCs, tachycardic/tachypneic, source of infection left lower lobe pneumonia and organ dysfunction his lungs with the presence of hypoxia requiring oxygen supplementation. -As mentioned above continue maintaining adequate hydration, IV antibiotics and further treatment of his pneumonia. -Follow culture results.

## 2021-12-17 NOTE — TOC Progression Note (Signed)
Transition of Care [facility]) - Progression Note    Patient Details  Name: Steve Jensen MRN: [id number] Date of Birth: 03/13/1941  Transition of Care [facility]) CM/SW Contact  Salome Arnt, [place] Phone Number: 12/17/2021, 11:12 AM  Clinical Narrative:   Transition of Care [facility]) Screening Note   Patient Details  Name: Steve Jensen Date of Birth: 1940-11-14   Transition of Care [facility]) CM/SW Contact:    Salome Arnt, [place] Phone Number: 12/17/2021, 11:12 AM    Transition of Care Department [facility]) has reviewed patient and no TOC needs have been identified at this time. We will continue to monitor patient advancement through interdisciplinary progression rounds. If new patient transition needs arise, please place a TOC consult.         Barriers to Discharge: Continued Medical Work up  Expected Discharge Plan and Services                                                 Social Determinants of Health (SDOH) Interventions    Readmission Risk Interventions     No data to display

## 2021-12-17 NOTE — Plan of Care (Signed)
  Problem: Coping: Goal: Ability to adjust to condition or change in health will improve Outcome: Progressing   Problem: Fluid Volume: Goal: Ability to maintain a balanced intake and output will improve Outcome: Progressing   Problem: Health Behavior/Discharge Planning: Goal: Ability to identify and utilize available resources and services will improve Outcome: Progressing   Problem: Metabolic: Goal: Ability to maintain appropriate glucose levels will improve Outcome: Progressing   Problem: Activity: Goal: Ability to tolerate increased activity will improve Outcome: Progressing   Problem: Respiratory: Goal: Ability to maintain adequate ventilation will improve Outcome: Progressing Goal: Ability to maintain a clear airway will improve Outcome: Progressing

## 2021-12-17 NOTE — Assessment & Plan Note (Signed)
-  Continue the use of CPAP nightly ?

## 2021-12-17 NOTE — Assessment & Plan Note (Signed)
-   Presumed to be bacterial -Continue current IV antibiotic -Follow culture results -Wean off oxygen supplementation as tolerated -Continue the use of flutter valve, mucolytic's bronchodilators and supportive care.

## 2021-12-18 DIAGNOSIS — J189 Pneumonia, unspecified organism: Secondary | ICD-10-CM | POA: Diagnosis not present

## 2021-12-18 LAB — GLUCOSE, CAPILLARY
Glucose-Capillary: 231 mg/dL — ABNORMAL HIGH (ref 70–99)
Glucose-Capillary: 246 mg/dL — ABNORMAL HIGH (ref 70–99)
Glucose-Capillary: 231 mg/dL — ABNORMAL HIGH (ref 70–99)
Glucose-Capillary: 253 mg/dL — ABNORMAL HIGH (ref 70–99)

## 2021-12-18 LAB — CBC
HCT: 28.2 % — ABNORMAL LOW (ref 39.0–52.0)
Hemoglobin: 8.3 g/dL — ABNORMAL LOW (ref 13.0–17.0)
MCH: 20.3 pg — ABNORMAL LOW (ref 26.0–34.0)
MCHC: 29.4 g/dL — ABNORMAL LOW (ref 30.0–36.0)
MCV: 69.1 fL — ABNORMAL LOW (ref 80.0–100.0)
Platelets: 561 10*3/uL — ABNORMAL HIGH (ref 150–400)
RBC: 4.08 MIL/uL — ABNORMAL LOW (ref 4.22–5.81)
RDW: 19.1 % — ABNORMAL HIGH (ref 11.5–15.5)
WBC: 21.2 10*3/uL — ABNORMAL HIGH (ref 4.0–10.5)
nRBC: 0.1 % (ref 0.0–0.2)

## 2021-12-18 LAB — BASIC METABOLIC PANEL
Anion gap: 10 (ref 5–15)
BUN: 18 mg/dL (ref 8–23)
CO2: 23 mmol/L (ref 22–32)
Calcium: 8.3 mg/dL — ABNORMAL LOW (ref 8.9–10.3)
Chloride: 108 mmol/L (ref 98–111)
Creatinine, Ser: 1.25 mg/dL — ABNORMAL HIGH (ref 0.61–1.24)
GFR, Estimated: 59 mL/min — ABNORMAL LOW (ref >60)
Glucose, Bld: 222 mg/dL — ABNORMAL HIGH (ref 70–99)
Potassium: 3.5 mmol/L (ref 3.5–5.1)
Sodium: 141 mmol/L (ref 135–145)

## 2021-12-18 LAB — PROCALCITONIN: Procalcitonin: 0.32 ng/mL

## 2021-12-18 MED ORDER — INSULIN GLARGINE-YFGN 100 UNIT/ML ~~LOC~~ SOLN
10.0000 [IU] | Freq: Every day | SUBCUTANEOUS | Status: DC
  Administered 2021-12-18 – 2021-12-19 (×2): 10 [IU] via SUBCUTANEOUS
  Filled 2021-12-18 (×3): qty 0.1

## 2021-12-18 MED ORDER — FUROSEMIDE 40 MG PO TABS
40.0000 mg | ORAL_TABLET | Freq: Two times a day (BID) | ORAL | Status: DC
Start: 2021-12-19 — End: 2021-12-19

## 2021-12-18 MED ORDER — IPRATROPIUM-ALBUTEROL 0.5-2.5 (3) MG/3ML IN SOLN
3.0000 mL | Freq: Four times a day (QID) | RESPIRATORY_TRACT | Status: DC | PRN
  Administered 2021-12-18 – 2021-12-27 (×11): 3 mL via RESPIRATORY_TRACT
  Filled 2021-12-18 (×11): qty 3

## 2021-12-18 NOTE — Progress Notes (Signed)
Tele called patient heart rate 140 in A-fib, now 90. Patient lying in bed with family in the room. MD Manuella Ghazi made aware.

## 2021-12-18 NOTE — Progress Notes (Signed)
Patient given PRN breathing treatment then assisted him with getting on his home CPAP machine.

## 2021-12-18 NOTE — Progress Notes (Signed)
PROGRESS NOTE    Steve Jensen  EAV:[id number] DOB: 1941-02-14 DOA: 12/16/2021 PCP: [name], MD   Brief Narrative:    Steve Jensen  is a 81 y.o. male reformed smoker with past medical history relevant for morbid obesity/OSA, chronic A-fib status post prior cardioversion chronically on Eliquis for stroke prophylaxis, HTN, chronic diastolic dysfunction CHF and advanced osteoarthritis as well as diabetes mellitus who presents to the ED with complaints of productive cough with colored sputum, shortness of breath and dyspnea on exertion since 12/13/2021.  Patient was admitted for severe sepsis secondary to community-acquired pneumonia, present on admission.  He is also noted to have associated acute hypoxemic respiratory failure.  Assessment & Plan:   Principal Problem:   Pneumonia Active Problems:   Sepsis due to CAP/PNA   Essential hypertension, benign   Obstructive sleep apnea   Morbid obesity (HCC)   Atrial fibrillation ([place])   Uncontrolled type 2 diabetes mellitus with hyperglycemia, without long-term current use of insulin (HCC)   Osteoarthritis of right knee  Assessment and Plan:   Severe sepsis, present on admission, due to CAP/PNA - Patient met criteria for severe sepsis due to pneumonia at time of admission; febrile, elevated WBCs, tachycardic/tachypneic, source of infection left lower lobe pneumonia and organ dysfunction his lungs with the presence of hypoxia requiring oxygen supplementation. -As mentioned above continue maintaining adequate hydration, IV antibiotics and further treatment of his pneumonia. -Follow culture results with no growth to date  Acute hypoxemic respiratory failure secondary to above -Currently on 4 L nasal cannula oxygen, continue to wean as tolerated   Osteoarthritis of right knee - Patient complaining of significant pain -Voltaren gel and as needed tramadol will be added to assist with discomfort -Outpatient follow-up with orthopedic  service as he has been advised of the need for knee replacement.   Uncontrolled type 2 diabetes mellitus with hyperglycemia, without long-term current use of insulin (HCC) with hyperglycemia -A1c 7.6  -Continue sliding scale insulin; NovoLog for meal coverage added. -Follow CBGs fluctuation and adjust hypoglycemic regimen as required -While inpatient continue holding oral hypoglycemic agents. -Add Semglee 10 units daily starting 7/26   Atrial fibrillation (HCC) - Paroxysmal in nature -Continue telemetry monitoring -Rate and rhythm stable at this time -Continue metoprolol and Cardizem for rate control -Continue Eliquis for secondary prophylaxis.   Morbid obesity ([place]) -Body mass index is 41.72 kg/m. -Low calorie diet, portion control and increase physical activity discussed with patient.   Obstructive sleep apnea - Continue the use of CPAP nightly   Essential hypertension, benign - Stable overall -Continue current antihypertensive agent -Heart healthy diet discussed with patient.    DVT prophylaxis: Eliquis Code Status: Full Family Communication: None at bedside Disposition Plan:  Status is: Inpatient Remains inpatient appropriate because: Currently on IV antibiotics  Consultants:  None  Procedures:  None  Antimicrobials:  Anti-infectives (From admission, onward)    Start     Dose/Rate Route Frequency Ordered Stop   12/17/21 0000  cefTRIAXone (ROCEPHIN) 1 g in sodium chloride 0.9 % 100 mL IVPB        1 g 200 mL/hr over 30 Minutes Intravenous Every 24 hours 12/16/21 2150     12/16/21 2200  doxycycline (VIBRA-TABS) tablet 100 mg        100 mg Oral Every 12 hours 12/16/21 2150     12/16/21 1945  cefTRIAXone (ROCEPHIN) 1 g in sodium chloride 0.9 % 100 mL IVPB        1 g 200  mL/hr over 30 Minutes Intravenous  Once 12/16/21 1939 12/16/21 2156   12/16/21 1945  doxycycline (VIBRA-TABS) tablet 100 mg        100 mg Oral  Once 12/16/21 1939 12/16/21 2000        Subjective: Patient seen and evaluated today with ongoing shortness of breath and noted ongoing fevers overnight between 101 and 102 Fahrenheit.  Objective: Vitals:   12/18/21 0429 12/18/21 0515 12/18/21 0808 12/18/21 0845  BP: 139/66   135/75  Pulse: 100   98  Resp: (!) 21     Temp:  99 F (37.2 C)    TempSrc:  Oral    SpO2: 95%  90%   Weight:      Height:        Intake/Output Summary (Last 24 hours) at 12/18/2021 1053 Last data filed at 12/18/2021 9390 Gross per 24 hour  Intake 120 ml  Output 650 ml  Net -530 ml   Filed Weights   12/16/21 1524 12/17/21 0003  Weight: 131.5 kg 131.9 kg    Examination:  General exam: Appears calm and comfortable  Respiratory system: Clear to auscultation. Respiratory effort normal.  Currently on 4 L nasal cannula. Cardiovascular system: S1 & S2 heard, RRR.  Gastrointestinal system: Abdomen is soft Central nervous system: Alert and awake Extremities: No edema Skin: No significant lesions noted Psychiatry: Flat affect.    Data Reviewed: I have personally reviewed following labs and imaging studies  CBC: Recent Labs  Lab 12/16/21 1816 12/17/21 0545 12/18/21 0535  WBC 20.8* 20.7* 21.2*  NEUTROABS 15.7*  --   --   HGB 9.0* 8.5* 8.3*  HCT 29.3* 27.8* 28.2*  MCV 66.4* 66.5* 69.1*  PLT 681* 633* 300*   Basic Metabolic Panel: Recent Labs  Lab 12/16/21 1816 12/17/21 0545 12/17/21 1823 12/18/21 0535  NA 141 139  --  141  K 3.5 3.3*  --  3.5  CL 106 106  --  108  CO2 25 23  --  23  GLUCOSE 185* 199*  --  222*  BUN 21 18  --  18  CREATININE 1.13 1.17  --  1.25*  CALCIUM 8.4* 8.2*  --  8.3*  MG  --   --  1.9  --    GFR: Estimated Creatinine Clearance: 65.5 mL/min (A) (by C-G formula based on SCr of 1.25 mg/dL (H)). Liver Function Tests: No results for input(s): "AST", "ALT", "ALKPHOS", "BILITOT", "PROT", "ALBUMIN" in the last 168 hours. No results for input(s): "LIPASE", "AMYLASE" in the last 168 hours. No results  for input(s): "AMMONIA" in the last 168 hours. Coagulation Profile: No results for input(s): "INR", "PROTIME" in the last 168 hours. Cardiac Enzymes: No results for input(s): "CKTOTAL", "CKMB", "CKMBINDEX", "TROPONINI" in the last 168 hours. BNP (last 3 results) Recent Labs    02/18/21 1555  PROBNP 99   HbA1C: Recent Labs    12/17/21 1823  HGBA1C 7.6*   CBG: Recent Labs  Lab 12/17/21 0806 12/17/21 1137 12/17/21 1701 12/17/21 2129 12/18/21 0723  GLUCAP 203* 312* 244* 252* 231*   Lipid Profile: No results for input(s): "CHOL", "HDL", "LDLCALC", "TRIG", "CHOLHDL", "LDLDIRECT" in the last 72 hours. Thyroid Function Tests: No results for input(s): "TSH", "T4TOTAL", "FREET4", "T3FREE", "THYROIDAB" in the last 72 hours. Anemia Panel: No results for input(s): "VITAMINB12", "FOLATE", "FERRITIN", "TIBC", "IRON", "RETICCTPCT" in the last 72 hours. Sepsis Labs: Recent Labs  Lab 12/16/21 1847 12/16/21 2104  LATICACIDVEN 1.5 1.3    Recent Results (from  the past 240 hour(s))  Culture, blood (routine x 2)     Status: None (Preliminary result)   Collection Time: 12/16/21  6:47 PM   Specimen: BLOOD  Result Value Ref Range Status   Specimen Description   Final    BLOOD LEFT ANTECUBITAL Performed at [facility], [place] [address], [place], [place] [address]    Special Requests   Final    BOTTLES DRAWN AEROBIC AND ANAEROBIC Blood Culture results may not be optimal due to an excessive volume of blood received in culture bottles Performed at [facility] [address], [place], [place] [address]    Culture   Final    NO GROWTH 2 DAYS Performed at [facility], [address], [place], [place] [address]    Report Status PENDING  Incomplete  Culture, blood (routine x 2)     Status: None (Preliminary result)   Collection Time: 12/16/21  6:52 PM   Specimen: BLOOD  Result Value Ref Range Status   Specimen Description   Final    BLOOD RIGHT ANTECUBITAL Performed at  [facility], [place] [address], [place], [place] [address]    Special Requests   Final    BOTTLES DRAWN AEROBIC AND ANAEROBIC Blood Culture adequate volume Performed at [facility], [place] [address], [place], [place] [address]    Culture   Final    NO GROWTH 2 DAYS Performed at [facility], [address] [place], [place] [address]    Report Status PENDING  Incomplete  SARS Coronavirus 2 by RT PCR (hospital order, performed in [facility] hospital lab) *cepheid single result test* Anterior Nasal Swab     Status: None   Collection Time: 12/16/21  8:07 PM   Specimen: Anterior Nasal Swab  Result Value Ref Range Status   SARS Coronavirus 2 by RT PCR NEGATIVE NEGATIVE Final    Comment: (NOTE) SARS-CoV-2 target nucleic acids are NOT DETECTED.  The SARS-CoV-2 RNA is generally detectable in upper and lower respiratory specimens during the acute phase of infection. The lowest concentration of SARS-CoV-2 viral copies this assay can detect is 250 copies / mL. A negative result does not preclude SARS-CoV-2 infection and should not be used as the sole basis for treatment or other patient management decisions.  A negative result may occur with improper specimen collection / handling, submission of specimen other than nasopharyngeal swab, presence of viral mutation(s) within the areas targeted by this assay, and inadequate number of viral copies (<250 copies / mL). A negative result must be combined with clinical observations, patient history, and epidemiological information.  Fact Sheet for Patients:   https://www.patel.info/  Fact Sheet for Healthcare Providers: https://hall.com/  This test is not yet approved or  cleared by the [place] FDA and has been authorized for detection and/or diagnosis of SARS-CoV-2 by FDA under an Emergency Use Authorization (EUA).  This EUA will remain in effect (meaning this test can be used) for the  duration of the COVID-19 declaration under Section 564(b)(1) of the Act, 21 U.S.C. section 360bbb-3(b)(1), unless the authorization is terminated or revoked sooner.  Performed at [facility], [address] [place], [place] [address]          Radiology Studies: [facility] Chest [facility] 1 View  Result Date: 12/16/2021 CLINICAL DATA:  Shortness of breath EXAM: PORTABLE CHEST 1 VIEW COMPARISON:  02/28/2021 FINDINGS: Cardiac size upper limits of normal. Slight central congestion. No pleural effusion. Patchy airspace disease  at the lingula and left base. No pneumothorax IMPRESSION: Patchy airspace disease at the lingula and left base, atelectasis versus pneumonia. Electronically Signed   By: Donavan Foil M.D.   On: 12/16/2021 18:34        Scheduled Meds:  apixaban  5 mg Oral BID   budesonide (PULMICORT) nebulizer solution  0.5 mg Nebulization BID   dextromethorphan-guaiFENesin  1 tablet Oral BID   diclofenac Sodium  4 g Topical QID   diltiazem  240 mg Oral Daily   doxycycline  100 mg Oral Q12H   ezetimibe  10 mg Oral Daily   insulin aspart  0-5 Units Subcutaneous QHS   insulin aspart  0-6 Units Subcutaneous TID WC   insulin aspart  4 Units Subcutaneous TID WC   insulin glargine-yfgn  10 Units Subcutaneous Daily   ipratropium-albuterol  3 mL Nebulization Q6H   metoprolol succinate  50 mg Oral Daily   pantoprazole  40 mg Oral Daily   pregabalin  100 mg Oral BID   ramipril  10 mg Oral BID   sodium chloride flush  3 mL Intravenous Q12H   sodium chloride flush  3 mL Intravenous Q12H   Continuous Infusions:  sodium chloride     cefTRIAXone (ROCEPHIN)  IV 1 g (12/17/21 2325)     LOS: 2 days    Time spent: 35 minutes    Lenoria Narine Darleen Crocker, DO [facility]  If 7PM-7AM, please contact night-coverage [email or web address] 12/18/2021, 10:53 AM

## 2021-12-18 NOTE — Inpatient Diabetes Management (Signed)
Inpatient Diabetes Program Recommendations  AACE/ADA: New Consensus Statement on Inpatient Glycemic Control  Target Ranges:  Prepandial:   less than 140 mg/dL      Peak postprandial:   less than 180 mg/dL (1-2 hours)      Critically ill patients:  140 - 180 mg/dL    Latest Reference Range & Units 12/17/21 08:06 12/17/21 11:37 12/17/21 17:01 12/17/21 21:29 12/18/21 07:23  Glucose-Capillary 70 - 99 mg/dL 203 (H) 312 (H) 244 (H) 252 (H) 231 (H)   Review of Glycemic Control  Diabetes history: DM2 Outpatient Diabetes medications: Metformin 1000 mg BID, Mounjaro 5 mg Qweek Current orders for Inpatient glycemic control: Novolog 0-6 units TID with meals, Novolog 0-5 units QHS, Novolog 4 units TID with meals   Inpatient Diabetes Program Recommendations:     Insulin: Please consider ordering Semglee 10 units Q24H.   Thanks, Barnie Alderman, RN, MSN, [place] Diabetes Coordinator Inpatient Diabetes Program [phone] (Team Pager from 8am to [place])

## 2021-12-18 NOTE — Progress Notes (Signed)
Set patient's home CPAP unit up and filled with sterile water. Unit is ready for use and working appropriately. Told patient to call if he needed any help with it. He stated understanding and said he should be fine. Unit plugged into red outlet.

## 2021-12-18 NOTE — Progress Notes (Signed)
Noted patient has had increased restlessness during shift, patient refused to wear hospital CPAP machine at night. Patient stated he is always restless. Informed patient he could have family bring personal CPAP machine to hospital, family informed. Patients personal CPAP machine at bedside now. MD Manuella Ghazi made aware.

## 2021-12-19 ENCOUNTER — Inpatient Hospital Stay (HOSPITAL_COMMUNITY): Payer: Medicare Other

## 2021-12-19 DIAGNOSIS — J189 Pneumonia, unspecified organism: Secondary | ICD-10-CM | POA: Diagnosis not present

## 2021-12-19 LAB — BASIC METABOLIC PANEL
Anion gap: 11 (ref 5–15)
BUN: 32 mg/dL — ABNORMAL HIGH (ref 8–23)
CO2: 20 mmol/L — ABNORMAL LOW (ref 22–32)
Calcium: 8.4 mg/dL — ABNORMAL LOW (ref 8.9–10.3)
Chloride: 107 mmol/L (ref 98–111)
Creatinine, Ser: 1.59 mg/dL — ABNORMAL HIGH (ref 0.61–1.24)
GFR, Estimated: 44 mL/min — ABNORMAL LOW (ref >60)
Glucose, Bld: 215 mg/dL — ABNORMAL HIGH (ref 70–99)
Potassium: 3.8 mmol/L (ref 3.5–5.1)
Sodium: 138 mmol/L (ref 135–145)

## 2021-12-19 LAB — CBC
HCT: 29.8 % — ABNORMAL LOW (ref 39.0–52.0)
Hemoglobin: 8.4 g/dL — ABNORMAL LOW (ref 13.0–17.0)
MCH: 19.8 pg — ABNORMAL LOW (ref 26.0–34.0)
MCHC: 28.2 g/dL — ABNORMAL LOW (ref 30.0–36.0)
MCV: 70.1 fL — ABNORMAL LOW (ref 80.0–100.0)
Platelets: 537 10*3/uL — ABNORMAL HIGH (ref 150–400)
RBC: 4.25 MIL/uL (ref 4.22–5.81)
RDW: 19.7 % — ABNORMAL HIGH (ref 11.5–15.5)
WBC: 20.2 10*3/uL — ABNORMAL HIGH (ref 4.0–10.5)
nRBC: 0.1 % (ref 0.0–0.2)

## 2021-12-19 LAB — GLUCOSE, CAPILLARY
Glucose-Capillary: 202 mg/dL — ABNORMAL HIGH (ref 70–99)
Glucose-Capillary: 248 mg/dL — ABNORMAL HIGH (ref 70–99)
Glucose-Capillary: 235 mg/dL — ABNORMAL HIGH (ref 70–99)
Glucose-Capillary: 249 mg/dL — ABNORMAL HIGH (ref 70–99)

## 2021-12-19 LAB — MAGNESIUM: Magnesium: 2.4 mg/dL (ref 1.7–2.4)

## 2021-12-19 LAB — MRSA NEXT GEN BY PCR, NASAL: MRSA by PCR Next Gen: NOT DETECTED

## 2021-12-19 LAB — PROCALCITONIN: Procalcitonin: 0.48 ng/mL

## 2021-12-19 MED ORDER — ENOXAPARIN SODIUM 150 MG/ML IJ SOSY
130.0000 mg | PREFILLED_SYRINGE | Freq: Two times a day (BID) | INTRAMUSCULAR | Status: DC
  Administered 2021-12-19 – 2021-12-27 (×15): 130 mg via SUBCUTANEOUS
  Filled 2021-12-19 (×25): qty 1

## 2021-12-19 MED ORDER — INSULIN GLARGINE-YFGN 100 UNIT/ML ~~LOC~~ SOLN
13.0000 [IU] | Freq: Every day | SUBCUTANEOUS | Status: DC
  Administered 2021-12-20 – 2021-12-23 (×4): 13 [IU] via SUBCUTANEOUS
  Filled 2021-12-19 (×6): qty 0.13

## 2021-12-19 MED ORDER — BISACODYL 10 MG RE SUPP
10.0000 mg | Freq: Once | RECTAL | Status: DC
Start: 2021-12-19 — End: 2021-12-27

## 2021-12-19 MED ORDER — BENZONATATE 100 MG PO CAPS
100.0000 mg | ORAL_CAPSULE | Freq: Three times a day (TID) | ORAL | Status: DC
  Administered 2021-12-19 – 2021-12-27 (×24): 100 mg via ORAL
  Filled 2021-12-19 (×25): qty 1

## 2021-12-19 MED ORDER — DICLOFENAC SODIUM 1 % EX GEL
4.0000 g | Freq: Four times a day (QID) | CUTANEOUS | Status: DC
  Administered 2021-12-19 – 2021-12-27 (×31): 4 g via TOPICAL
  Filled 2021-12-19: qty 100

## 2021-12-19 MED ORDER — SODIUM CHLORIDE 0.9 % IV SOLN
2.0000 g | Freq: Two times a day (BID) | INTRAVENOUS | Status: DC
  Administered 2021-12-19 – 2021-12-21 (×6): 2 g via INTRAVENOUS
  Filled 2021-12-19 (×6): qty 12.5

## 2021-12-19 MED ORDER — LACTATED RINGERS IV SOLN: INTRAVENOUS | Status: AC

## 2021-12-19 MED ORDER — METOPROLOL TARTRATE 5 MG/5ML IV SOLN
5.0000 mg | Freq: Three times a day (TID) | INTRAVENOUS | Status: DC
  Administered 2021-12-19 – 2021-12-21 (×6): 5 mg via INTRAVENOUS
  Filled 2021-12-19 (×6): qty 5

## 2021-12-19 NOTE — Progress Notes (Signed)
ANTICOAGULATION CONSULT NOTE - Initial Consult  Pharmacy Consult for lovenox Indication: atrial fibrillation  Allergies  Allergen Reactions   Lipitor [Atorvastatin] Other (See Comments)    Muscle aches   Simvastatin Other (See Comments)    Muscle aches   Zithromax [Azithromycin] Other (See Comments)    GI-SIDE EFFECTS   Amlodipine Other (See Comments)    Patient Measurements: Height: '5\' 10"'$  (177.8 cm) Weight: 131.9 kg (290 lb 12.6 oz) IBW/kg (Calculated) : 73 Heparin Dosing Weight:   Vital Signs: Temp: 97.9 F (36.6 C) (07/27 1232) Temp Source: Oral (07/27 0512) BP: 119/77 (07/27 1232) Pulse Rate: 101 (07/27 1232)  Labs: Recent Labs    12/16/21 1816 12/16/21 2104 12/17/21 0545 12/18/21 0535 12/19/21 0536  HGB 9.0*  --  8.5* 8.3* 8.4*  HCT 29.3*  --  27.8* 28.2* 29.8*  PLT 681*  --  633* 561* 537*  CREATININE 1.13  --  1.17 1.25* 1.59*  TROPONINIHS 14 13  --   --   --     Estimated Creatinine Clearance: 51.5 mL/min (A) (by C-G formula based on SCr of 1.59 mg/dL (H)).   Medical History: Past Medical History:  Diagnosis Date   Anemia    Diabetes mellitus type II, controlled ([place])    with neuropathy   Dysrhythmia    A-Fib. cardioversion done   GERD (gastroesophageal reflux disease)    Hyperlipidemia    Hypertension    Morbid obesity ([place])    Neuromuscular disorder ([place])    feet   OSA (obstructive sleep apnea)    On BiPAP at 15/11cm H2O   Prostate cancer ([place])    Shingles    on face Nov 2010   Urinary incontinence     Medications:  Medications Prior to Admission  Medication Sig Dispense Refill Last Dose   allopurinol (ZYLOPRIM) 100 MG tablet Take 100 mg by mouth daily.   12/15/2021   apixaban (ELIQUIS) 5 MG TABS tablet TAKE 1 TABLET(5 MG) BY MOUTH TWICE DAILY 180 tablet 1 12/15/2021 at 1030   CINNAMON PO Take 1,000 mg by mouth daily.   12/15/2021   diltiazem (CARDIZEM CD) 240 MG 24 hr capsule TAKE 1 CAPSULE BY MOUTH  DAILY 90 capsule 3 12/15/2021    fish oil-omega-3 fatty acids 1000 MG capsule Take 2 g by mouth 2 (two) times daily.    12/15/2021   furosemide (LASIX) 40 MG tablet TAKE 1 TABLET BY MOUTH TWICE  DAILY AS NEEDED 180 tablet 2 12/15/2021   ibuprofen (ADVIL,MOTRIN) 200 MG tablet Take 800 mg by mouth every 6 (six) hours as needed (PAIN). prn   12/15/2021   loratadine (CLARITIN) 10 MG tablet Take 10 mg by mouth daily as needed for allergies.    12/15/2021   metFORMIN (GLUCOPHAGE) 1000 MG tablet Take 1,000 mg by mouth 2 (two) times daily.  0 12/15/2021   metoprolol succinate (TOPROL-XL) 50 MG 24 hr tablet Take 50 mg by mouth daily.    12/15/2021 at 1930   Multiple Vitamins-Minerals (MULTIVITAMIN WITH MINERALS) tablet Take 1 tablet by mouth daily.   12/15/2021   omeprazole (PRILOSEC) 20 MG capsule Take 20 mg by mouth daily.    12/15/2021   oxymetazoline (AFRIN) 0.05 % nasal spray Place 1 spray into both nostrils 2 (two) times daily as needed for congestion.   12/15/2021   potassium chloride SA (KLOR-CON M) 20 MEQ tablet TAKE 1 TABLET BY MOUTH  DAILY AS NEEDED WHILE  TAKING FUROSEMIDE (Patient taking differently: Take 20 mEq  by mouth daily.) 90 tablet 3 12/15/2021   pregabalin (LYRICA) 100 MG capsule Take 100 mg by mouth 2 (two) times a day.    12/15/2021   ramipril (ALTACE) 10 MG capsule Take 10 mg by mouth 2 (two) times daily.   12/15/2021   tirzepatide (MOUNJARO) 5 MG/0.5ML Pen Inject 5 mg into the skin once a week.   Past Week   ZETIA 10 MG tablet Take 10 mg by mouth daily.    12/15/2021    Assessment: Pharmacy consulted to dose lovenox in patient with atrial fibrillation.  Patient is on apixaban prior to admission with last dose given 7/27 @ 0948.  Hgb 8.4  Goal of Therapy:   Monitor platelets by anticoagulation protocol: Yes   Plan:  Lovenox 130 mg subq every 12 hours (starting at 2200) Monitor labs and s/s of bleeding.  Margot Ables, PharmD Clinical Pharmacist 12/19/2021 2:34 PM

## 2021-12-19 NOTE — Progress Notes (Signed)
PROGRESS NOTE    Steve Jensen  TIR:[id number] DOB: 02-Dec-1940 DOA: 12/16/2021 PCP: [name], MD   Brief Narrative:    Steve Jensen  is a 81 y.o. male reformed smoker with past medical history relevant for morbid obesity/OSA, chronic A-fib status post prior cardioversion chronically on Eliquis for stroke prophylaxis, HTN, chronic diastolic dysfunction CHF and advanced osteoarthritis as well as diabetes mellitus who presents to the ED with complaints of productive cough with colored sputum, shortness of breath and dyspnea on exertion since 12/13/2021.  Patient was admitted for severe sepsis secondary to community-acquired pneumonia, present on admission.  He is also noted to have associated acute hypoxemic respiratory failure.  Assessment & Plan:   Principal Problem:   Pneumonia Active Problems:   Sepsis due to CAP/PNA   Essential hypertension, benign   Obstructive sleep apnea   Morbid obesity (HCC)   Atrial fibrillation ([place])   Uncontrolled type 2 diabetes mellitus with hyperglycemia, without long-term current use of insulin (HCC)   Osteoarthritis of right knee  Assessment and Plan:   Severe sepsis, present on admission, due to CAP/PNA-ongoing - Patient met criteria for severe sepsis due to pneumonia at time of admission; febrile, elevated WBCs, tachycardic/tachypneic, source of infection left lower lobe pneumonia and organ dysfunction his lungs with the presence of hypoxia requiring oxygen supplementation. -As mentioned above continue maintaining adequate hydration, IV antibiotics and further treatment of his pneumonia. -Repeat blood cultures with fever overnight and check urine cultures and chest x-ray -Increase Rocephin to cefepime and continue doxycycline, check MRSA PCR   Acute hypoxemic respiratory failure secondary to above -Currently on 4 L nasal cannula oxygen, continue to wean as tolerated -Chest x-ray 7/27 with atelectasis -Incentive spirometry -Up in chair  with ambulation   Osteoarthritis of right knee - Patient complaining of significant pain -Voltaren gel and as needed tramadol will be added to assist with discomfort -Outpatient follow-up with orthopedic service as he has been advised of the need for knee replacement.   Uncontrolled type 2 diabetes mellitus with hyperglycemia, without long-term current use of insulin (HCC) with hyperglycemia -A1c 7.6  -Continue sliding scale insulin; NovoLog for meal coverage added. -Follow CBGs fluctuation and adjust hypoglycemic regimen as required -While inpatient continue holding oral hypoglycemic agents. -Semglee to 13 units daily 7/27   Atrial fibrillation (HCC) - Paroxysmal in nature -Continue telemetry monitoring -Rate and rhythm stable at this time -Continue metoprolol and Cardizem for rate control -Continue Eliquis for secondary prophylaxis.   Morbid obesity ([place]) -Body mass index is 41.72 kg/m. -Low calorie diet, portion control and increase physical activity discussed with patient.   Obstructive sleep apnea - Continue the use of CPAP nightly   Essential hypertension, benign - Stable overall -Continue current antihypertensive agent -Heart healthy diet discussed with patient.     DVT prophylaxis: Eliquis Code Status: Full Family Communication: None at bedside Disposition Plan:  Status is: Inpatient Remains inpatient appropriate because: Currently on IV antibiotics   Consultants:  None   Procedures:  None   Antimicrobials:  Anti-infectives (From admission, onward)    Start     Dose/Rate Route Frequency Ordered Stop   12/19/21 0830  ceFEPIme (MAXIPIME) 2 g in sodium chloride 0.9 % 100 mL IVPB        2 g 200 mL/hr over 30 Minutes Intravenous Every 12 hours 12/19/21 0738     12/17/21 0000  cefTRIAXone (ROCEPHIN) 1 g in sodium chloride 0.9 % 100 mL IVPB  Status:  Discontinued  1 g 200 mL/hr over 30 Minutes Intravenous Every 24 hours 12/16/21 2150 12/19/21 0704    12/16/21 2200  doxycycline (VIBRA-TABS) tablet 100 mg        100 mg Oral Every 12 hours 12/16/21 2150     12/16/21 1945  cefTRIAXone (ROCEPHIN) 1 g in sodium chloride 0.9 % 100 mL IVPB        1 g 200 mL/hr over 30 Minutes Intravenous  Once 12/16/21 1939 12/16/21 2156   12/16/21 1945  doxycycline (VIBRA-TABS) tablet 100 mg        100 mg Oral  Once 12/16/21 1939 12/16/21 2000       Subjective: Patient seen and evaluated today with ongoing cough as well as bilateral knee pain.  He has been noted to have fever overnight.  Objective: Vitals:   12/19/21 0512 12/19/21 0814 12/19/21 0815 12/19/21 0951  BP: (!) 118/46   (!) 134/59  Pulse: (!) 103   84  Resp: 19     Temp: 99.9 F (37.7 C)     TempSrc: Oral     SpO2: 92% 92% 92%   Weight:      Height:        Intake/Output Summary (Last 24 hours) at 12/19/2021 1111 Last data filed at 12/19/2021 0917 Gross per 24 hour  Intake 720 ml  Output --  Net 720 ml   Filed Weights   12/16/21 1524 12/17/21 0003  Weight: 131.5 kg 131.9 kg    Examination:  General exam: Appears calm and comfortable  Respiratory system: Clear to auscultation. Respiratory effort normal.  4 L nasal cannula Cardiovascular system: S1 & S2 heard, RRR.  Gastrointestinal system: Abdomen is distended Central nervous system: Alert and awake Extremities: No edema Skin: No significant lesions noted Psychiatry: Flat affect.    Data Reviewed: I have personally reviewed following labs and imaging studies  CBC: Recent Labs  Lab 12/16/21 1816 12/17/21 0545 12/18/21 0535 12/19/21 0536  WBC 20.8* 20.7* 21.2* 20.2*  NEUTROABS 15.7*  --   --   --   HGB 9.0* 8.5* 8.3* 8.4*  HCT 29.3* 27.8* 28.2* 29.8*  MCV 66.4* 66.5* 69.1* 70.1*  PLT 681* 633* 561* 270*   Basic Metabolic Panel: Recent Labs  Lab 12/16/21 1816 12/17/21 0545 12/17/21 1823 12/18/21 0535 12/19/21 0536  NA 141 139  --  141 138  K 3.5 3.3*  --  3.5 3.8  CL 106 106  --  108 107  CO2 25 23  --   23 20*  GLUCOSE 185* 199*  --  222* 215*  BUN 21 18  --  18 32*  CREATININE 1.13 1.17  --  1.25* 1.59*  CALCIUM 8.4* 8.2*  --  8.3* 8.4*  MG  --   --  1.9  --  2.4   GFR: Estimated Creatinine Clearance: 51.5 mL/min (A) (by C-G formula based on SCr of 1.59 mg/dL (H)). Liver Function Tests: No results for input(s): "AST", "ALT", "ALKPHOS", "BILITOT", "PROT", "ALBUMIN" in the last 168 hours. No results for input(s): "LIPASE", "AMYLASE" in the last 168 hours. No results for input(s): "AMMONIA" in the last 168 hours. Coagulation Profile: No results for input(s): "INR", "PROTIME" in the last 168 hours. Cardiac Enzymes: No results for input(s): "CKTOTAL", "CKMB", "CKMBINDEX", "TROPONINI" in the last 168 hours. BNP (last 3 results) Recent Labs    02/18/21 1555  PROBNP 99   HbA1C: Recent Labs    12/17/21 1823  HGBA1C 7.6*   CBG: Recent Labs  Lab 12/18/21 0723 12/18/21 1058 12/18/21 1644 12/18/21 2154 12/19/21 0710  GLUCAP 231* 246* 231* 253* 202*   Lipid Profile: No results for input(s): "CHOL", "HDL", "LDLCALC", "TRIG", "CHOLHDL", "LDLDIRECT" in the last 72 hours. Thyroid Function Tests: No results for input(s): "TSH", "T4TOTAL", "FREET4", "T3FREE", "THYROIDAB" in the last 72 hours. Anemia Panel: No results for input(s): "VITAMINB12", "FOLATE", "FERRITIN", "TIBC", "IRON", "RETICCTPCT" in the last 72 hours. Sepsis Labs: Recent Labs  Lab 12/16/21 1847 12/16/21 2104 12/18/21 0617 12/19/21 0536  PROCALCITON  --   --  0.32 0.48  LATICACIDVEN 1.5 1.3  --   --     Recent Results (from the past 240 hour(s))  Culture, blood (routine x 2)     Status: None (Preliminary result)   Collection Time: 12/16/21  6:47 PM   Specimen: BLOOD  Result Value Ref Range Status   Specimen Description   Final    BLOOD LEFT ANTECUBITAL Performed at [facility], [place] [address] [place], [place] [address]    Special Requests   Final    BOTTLES DRAWN AEROBIC AND ANAEROBIC Blood  Culture results may not be optimal due to an excessive volume of blood received in culture bottles Performed at [place] [address] [place], [place] [address]    Culture   Final    NO GROWTH 3 DAYS Performed at [facility], [address] [place], [place] [address]    Report Status PENDING  Incomplete  Culture, blood (routine x 2)     Status: None (Preliminary result)   Collection Time: 12/16/21  6:52 PM   Specimen: BLOOD  Result Value Ref Range Status   Specimen Description   Final    BLOOD RIGHT ANTECUBITAL Performed at [facility], [place] [address] [place], [place] [address]    Special Requests   Final    BOTTLES DRAWN AEROBIC AND ANAEROBIC Blood Culture adequate volume Performed at [facility], [place] [address] [place], [place] [address]    Culture   Final    NO GROWTH 3 DAYS Performed at [facility], [address], [place], [place] [address]    Report Status PENDING  Incomplete  SARS Coronavirus 2 by RT PCR (hospital order, performed in [facility] hospital lab) *cepheid single result test* Anterior Nasal Swab     Status: None   Collection Time: 12/16/21  8:07 PM   Specimen: Anterior Nasal Swab  Result Value Ref Range Status   SARS Coronavirus 2 by RT PCR NEGATIVE NEGATIVE Final    Comment: (NOTE) SARS-CoV-2 target nucleic acids are NOT DETECTED.  The SARS-CoV-2 RNA is generally detectable in upper and lower respiratory specimens during the acute phase of infection. The lowest concentration of SARS-CoV-2 viral copies this assay can detect is 250 copies / mL. A negative result does not preclude SARS-CoV-2 infection and should not be used as the sole basis for treatment or other patient management decisions.  A negative result may occur with improper specimen collection / handling, submission of specimen other than nasopharyngeal swab, presence of viral mutation(s) within the areas targeted by this assay, and inadequate number of viral  copies (<250 copies / mL). A negative result must be combined with clinical observations, patient history, and epidemiological information.  Fact Sheet for Patients:   https://www.patel.info/  Fact Sheet for Healthcare Providers: https://hall.com/  This test is not yet approved or  cleared by the [place] FDA and has been authorized for detection and/or  diagnosis of SARS-CoV-2 by FDA under an Emergency Use Authorization (EUA).  This EUA will remain in effect (meaning this test can be used) for the duration of the COVID-19 declaration under Section 564(b)(1) of the Act, 21 U.S.C. section 360bbb-3(b)(1), unless the authorization is terminated or revoked sooner.  Performed at [facility], [address] [place], [place] [address]   Culture, blood (Routine X 2) w Reflex to ID Panel     Status: None (Preliminary result)   Collection Time: 12/19/21  7:11 AM   Specimen: BLOOD RIGHT HAND  Result Value Ref Range Status   Specimen Description BLOOD RIGHT HAND  Final   Special Requests   Final    BOTTLES DRAWN AEROBIC AND ANAEROBIC Blood Culture adequate volume Performed at [facility], [address] [place], [place] [address]    Culture PENDING  Incomplete   Report Status PENDING  Incomplete  Culture, blood (Routine X 2) w Reflex to ID Panel     Status: None (Preliminary result)   Collection Time: 12/19/21  7:12 AM   Specimen: Right Antecubital; Blood  Result Value Ref Range Status   Specimen Description RIGHT ANTECUBITAL  Final   Special Requests   Final    BOTTLES DRAWN AEROBIC AND ANAEROBIC Blood Culture adequate volume Performed at [facility], [address], [place], [place] [address]    Culture PENDING  Incomplete   Report Status PENDING  Incomplete         Radiology Studies: DG CHEST PORT 1 VIEW  Result Date: 12/19/2021 CLINICAL DATA:  Cough with shortness of breath and chest pain EXAM: PORTABLE CHEST 1 VIEW  COMPARISON:  Chest x-ray dated December 16, 2021 FINDINGS: Cardiac and mediastinal contours are unchanged when accounting for AP technique and lung volumes. Low lung volumes with hypoventilatory changes. Mild left basilar opacity which is likely due to atelectasis. No large pleural effusion or pneumothorax. Numerous dilated gas-filled loops of bowel are seen in the partially visualized upper abdomen. IMPRESSION: 1. Low lung volumes with hypoventilatory changes. Mild left basilar opacity which is likely due to atelectasis. 2. Numerous dilated gas-filled loops of bowel seen in the partially visualized upper abdomen, recommend clinical correlation and consider dedicated abdominal radiograph for further evaluation. Electronically Signed   By: Yetta Glassman M.D.   On: 12/19/2021 09:44        Scheduled Meds:  apixaban  5 mg Oral BID   benzonatate  100 mg Oral TID   bisacodyl  10 mg Rectal Once   budesonide (PULMICORT) nebulizer solution  0.5 mg Nebulization BID   dextromethorphan-guaiFENesin  1 tablet Oral BID   diclofenac Sodium  4 g Topical QID   diclofenac Sodium  4 g Topical QID   diltiazem  240 mg Oral Daily   doxycycline  100 mg Oral Q12H   ezetimibe  10 mg Oral Daily   insulin aspart  0-5 Units Subcutaneous QHS   insulin aspart  0-6 Units Subcutaneous TID WC   insulin aspart  4 Units Subcutaneous TID WC   [START ON 12/20/2021] insulin glargine-yfgn  13 Units Subcutaneous Daily   metoprolol succinate  50 mg Oral Daily   pantoprazole  40 mg Oral Daily   pregabalin  100 mg Oral BID   sodium chloride flush  3 mL Intravenous Q12H   sodium chloride flush  3 mL Intravenous Q12H   Continuous Infusions:  sodium chloride     ceFEPime (MAXIPIME) IV 2 g (12/19/21 1002)   lactated ringers  LOS: 3 days    Time spent: 35 minutes    Lynsay Fesperman Darleen Crocker, DO [facility]  If 7PM-7AM, please contact night-coverage [email or web address] 12/19/2021, 11:11 AM

## 2021-12-19 NOTE — Progress Notes (Signed)
Patient alert and verbal, able to make needs known. Patient tolerated rectal tube with no complaints, rectal tube intact. Patient encouraged to [place] incentive spirometry, noted patient using several times during shift. Unable to wean patients oxygen due to increased work of breathing. Patient given PRN neb treatment due to wheezing, MD aware. Family requested to speak with MD regarding patients condition, MD made aware.

## 2021-12-19 NOTE — Progress Notes (Signed)
Patient stated he could not eat lunch tray due to stomach being tender. Noted patient abdomen distended, firm and tender more versus yesterday. MD Manuella Ghazi made aware. New orders placed.

## 2021-12-19 NOTE — Progress Notes (Signed)
Pt had labored breathing throughout the night,with inspiratory and expiratory wheezing. Pt feels SOB regularly and interchanged multiple times between his CPAP and 4LNC. Pt O2 on 4LNC 92%.

## 2021-12-19 NOTE — TOC Initial Note (Signed)
Transition of Care [facility]) - Initial/Assessment Note    Patient Details  Name: Steve Jensen MRN: [id number] Date of Birth: 1941/02/09  [facility]) CM/SW Contact:    Iona Beard, [place] Phone Number: 12/19/2021, 12:40 PM  Clinical Narrative:                 Pt is high risk for readmission. CSW spoke with pt in room to complete assessment. Pt states that his grandson and grandson's wife live with him. Pt is independent in completing his ADLs. Pt is able to drive when needed. Pt states that he has not had [place] services in the past. Pt has a cane, walker and wheelchair to use as needed. Pt states that he does not wear O2 at home. TOC to follow.   Expected Discharge Plan: Home/Self Care Barriers to Discharge: Continued Medical Work up   Patient Goals and CMS Choice Patient states their goals for this hospitalization and ongoing recovery are:: return home CMS Medicare.gov Compare Post Acute Care list provided to:: Patient Choice offered to / list presented to : Patient  Expected Discharge Plan and Services Expected Discharge Plan: Home/Self Care In-house Referral: Clinical Social Work Discharge Planning Services: CM Consult   Living arrangements for the past 2 months: Single Family Home                                      Prior Living Arrangements/Services Living arrangements for the past 2 months: Single Family Home Lives with:: Relatives Patient language and need for interpreter reviewed:: Yes Do you feel safe going back to the place where you live?: Yes      Need for Family Participation in Patient Care: Yes (Comment) Care giver support system in place?: Yes (comment) Current home services: DME Criminal Activity/Legal Involvement Pertinent to Current Situation/Hospitalization: No - Comment as needed  Activities of Daily Living Home Assistive Devices/Equipment: Gilford Rile (specify type) ADL Screening (condition at time of admission) Patient's cognitive  ability adequate to safely complete daily activities?: Yes Is the patient deaf or have difficulty hearing?: No Does the patient have difficulty seeing, even when wearing glasses/contacts?: No Does the patient have difficulty concentrating, remembering, or making decisions?: No Patient able to express need for assistance with ADLs?: Yes Does the patient have difficulty dressing or bathing?: No Independently performs ADLs?: Yes (appropriate for developmental age) Does the patient have difficulty walking or climbing stairs?: Yes Weakness of Legs: Right Weakness of Arms/Hands: None  Permission Sought/Granted                  Emotional Assessment Appearance:: Appears stated age Attitude/Demeanor/Rapport: Engaged Affect (typically observed): Accepting Orientation: : Oriented to Self, Oriented to Place, Oriented to  Time, Oriented to Situation Alcohol / Substance Use: Not Applicable Psych Involvement: No (comment)  Admission diagnosis:  Pneumonia [J18.9] Patient Active Problem List   Diagnosis Date Noted   Uncontrolled type 2 diabetes mellitus with hyperglycemia, without long-term current use of insulin ([place]) 12/17/2021   Osteoarthritis of right knee 12/17/2021   Pneumonia 12/16/2021   Sepsis due to CAP/PNA 12/16/2021   [place] (hepatocellular carcinoma) ([place]) 02/15/2018   Hepatocellular carcinoma ([place]) 12/30/2017   Pain in joint of left shoulder 11/09/2017   Hypertensive heart disease with heart failure ([place]) 03/23/2017   Chronic diastolic heart failure ([place]) 03/19/2015   Atrial fibrillation ([place]) 08/11/2013   S/P left knee arthroscopy 07/11/2013  Mixed hyperlipidemia 03/03/2013   Essential hypertension, benign 03/03/2013   Malignant neoplasm of prostate ([place]) 03/03/2013   Osteoarthrosis, unspecified whether generalized or localized, other specified sites 03/03/2013   Allergic rhinitis, cause unspecified 03/03/2013   Type II or unspecified type diabetes mellitus with neurological  manifestations, uncontrolled(250.62) 03/03/2013   Polyneuropathy in diabetes(357.2) 03/03/2013   Obstructive sleep apnea 03/03/2013   Proteinuria 03/03/2013   Morbid obesity ([place]) 03/03/2013   Right knee pain 02/07/2013   PCP:  [name], MD Pharmacy:   [facility] - [place], [place] [address] Phone: [phone] Fax: [phone]  [id number] [place] [facility] [address] [place]  [address]Phone: [phone]: [phone]    Social Determinants of Health (SDOH) Interventions    Readmission Risk Interventions    12/19/2021   12:39 PM  Readmission Risk Prevention Plan  Transportation Screening Complete  [place] or [place]  Social Work Consult for [place]/Counseling Complete  Palliative Care Screening Not Applicable  Medication Review (Press photographer Complete

## 2021-12-19 NOTE — Inpatient Diabetes Management (Signed)
Inpatient Diabetes Program Recommendations  AACE/ADA: New Consensus Statement on Inpatient Glycemic Control   Target Ranges:  Prepandial:   less than 140 mg/dL      Peak postprandial:   less than 180 mg/dL (1-2 hours)      Critically ill patients:  140 - 180 mg/dL    Latest Reference Range & Units 12/18/21 07:23 12/18/21 10:58 12/18/21 16:44 12/18/21 21:54 12/19/21 07:10  Glucose-Capillary 70 - 99 mg/dL 231 (H) 246 (H) 231 (H) 253 (H) 202 (H)   Review of Glycemic Control  Diabetes history: DM2 Outpatient Diabetes medications: Metformin 1000 mg BID, Mounjaro 5 mg Qweek Current orders for Inpatient glycemic control: Semglee 10 units daily, Novolog 0-6 units TID with meals, Novolog 0-5 units QHS, Novolog 4 units TID with meals   Inpatient Diabetes Program Recommendations:     Insulin: Please consider increasing Semglee to 13 units daily.  Thanks, Barnie Alderman, RN, MSN, [facility] Coordinator Inpatient Diabetes Program [phone] (Team Pager from 8am to [place])

## 2021-12-19 NOTE — Progress Notes (Signed)
Pharmacy Antibiotic Note  Steve Jensen is a 81 y.o. male admitted on 12/16/2021 with sepsis.  Pharmacy has been consulted for cefepime dosing.  Plan: Cefepime 2000 mg IV every 12 hours. Monitor labs, c/s, and patient improvement.  Height: '5\' 10"'$  (177.8 cm) Weight: 131.9 kg (290 lb 12.6 oz) IBW/kg (Calculated) : 73  Temp (24hrs), Avg:99.7 F (37.6 C), Min:97.9 F (36.6 C), [name]:101.3 F (38.5 C)  Recent Labs  Lab 12/16/21 1816 12/16/21 1847 12/16/21 2104 12/17/21 0545 12/18/21 0535 12/19/21 0536  WBC 20.8*  --   --  20.7* 21.2* 20.2*  CREATININE 1.13  --   --  1.17 1.25* 1.59*  LATICACIDVEN  --  1.5 1.3  --   --   --     Estimated Creatinine Clearance: 51.5 mL/min (A) (by C-G formula based on SCr of 1.59 mg/dL (H)).    Allergies  Allergen Reactions   Lipitor [Atorvastatin] Other (See Comments)    Muscle aches   Simvastatin Other (See Comments)    Muscle aches   Zithromax [Azithromycin] Other (See Comments)    GI-SIDE EFFECTS   Amlodipine Other (See Comments)    Antimicrobials this admission: Cefepime 7/27 >> CTX 7/25 >> 7/26   Microbiology results: 7/24 BCx: pending 7/27 Bcx: pending 7/25 UCx: pending  7/27 MRSA PCR: pending  Thank you for allowing pharmacy to be a part of this patient's care.  Margot Ables, PharmD Clinical Pharmacist 12/19/2021 7:42 AM

## 2021-12-19 NOTE — Progress Notes (Signed)
Tele called patient had a 12 beat run of wide QRS, heart rate 150-160, now 81. Patient lying in bed, reported no complaints. MD Manuella Ghazi made aware. New orders placed. Patient given Lopressor 5 mg IV.

## 2021-12-20 ENCOUNTER — Inpatient Hospital Stay (HOSPITAL_COMMUNITY): Payer: Medicare Other

## 2021-12-20 DIAGNOSIS — J399 Disease of upper respiratory tract, unspecified: Secondary | ICD-10-CM | POA: Diagnosis not present

## 2021-12-20 DIAGNOSIS — K567 Ileus, unspecified: Secondary | ICD-10-CM

## 2021-12-20 DIAGNOSIS — J9601 Acute respiratory failure with hypoxia: Secondary | ICD-10-CM | POA: Diagnosis not present

## 2021-12-20 DIAGNOSIS — J189 Pneumonia, unspecified organism: Secondary | ICD-10-CM | POA: Diagnosis not present

## 2021-12-20 LAB — CBC
HCT: 31.1 % — ABNORMAL LOW (ref 39.0–52.0)
Hemoglobin: 9.2 g/dL — ABNORMAL LOW (ref 13.0–17.0)
MCH: 20.1 pg — ABNORMAL LOW (ref 26.0–34.0)
MCHC: 29.6 g/dL — ABNORMAL LOW (ref 30.0–36.0)
MCV: 67.9 fL — ABNORMAL LOW (ref 80.0–100.0)
Platelets: 873 10*3/uL — ABNORMAL HIGH (ref 150–400)
RBC: 4.58 MIL/uL (ref 4.22–5.81)
RDW: 19.9 % — ABNORMAL HIGH (ref 11.5–15.5)
WBC: 23.9 10*3/uL — ABNORMAL HIGH (ref 4.0–10.5)
nRBC: 0.1 % (ref 0.0–0.2)

## 2021-12-20 LAB — BASIC METABOLIC PANEL
Anion gap: 13 (ref 5–15)
BUN: 46 mg/dL — ABNORMAL HIGH (ref 8–23)
CO2: 20 mmol/L — ABNORMAL LOW (ref 22–32)
Calcium: 8.8 mg/dL — ABNORMAL LOW (ref 8.9–10.3)
Chloride: 104 mmol/L (ref 98–111)
Creatinine, Ser: 1.44 mg/dL — ABNORMAL HIGH (ref 0.61–1.24)
GFR, Estimated: 49 mL/min — ABNORMAL LOW (ref >60)
Glucose, Bld: 214 mg/dL — ABNORMAL HIGH (ref 70–99)
Potassium: 3.4 mmol/L — ABNORMAL LOW (ref 3.5–5.1)
Sodium: 137 mmol/L (ref 135–145)

## 2021-12-20 LAB — GLUCOSE, CAPILLARY
Glucose-Capillary: 223 mg/dL — ABNORMAL HIGH (ref 70–99)
Glucose-Capillary: 248 mg/dL — ABNORMAL HIGH (ref 70–99)
Glucose-Capillary: 201 mg/dL — ABNORMAL HIGH (ref 70–99)
Glucose-Capillary: 212 mg/dL — ABNORMAL HIGH (ref 70–99)

## 2021-12-20 LAB — BLOOD GAS, ARTERIAL
Acid-base deficit: 2.1 mmol/L — ABNORMAL HIGH (ref 0.0–2.0)
Bicarbonate: 20.9 mmol/L (ref 20.0–28.0)
Drawn by: 22223
FIO2: 38 %
O2 Saturation: 94.8 %
Patient temperature: 37
pCO2 arterial: 30 mmHg — ABNORMAL LOW (ref 32–48)
pH, Arterial: 7.45 (ref 7.35–7.45)
pO2, Arterial: 68 mmHg — ABNORMAL LOW (ref 83–108)

## 2021-12-20 LAB — PROCALCITONIN: Procalcitonin: 0.53 ng/mL

## 2021-12-20 LAB — MAGNESIUM: Magnesium: 2.4 mg/dL (ref 1.7–2.4)

## 2021-12-20 MED ORDER — POTASSIUM CHLORIDE 10 MEQ/100ML IV SOLN
10.0000 meq | INTRAVENOUS | Status: AC
  Administered 2021-12-20 (×4): 10 meq via INTRAVENOUS
  Filled 2021-12-20 (×4): qty 100

## 2021-12-20 MED ORDER — METOCLOPRAMIDE HCL 5 MG/ML IJ SOLN
5.0000 mg | Freq: Four times a day (QID) | INTRAMUSCULAR | Status: AC
  Administered 2021-12-20 – 2021-12-21 (×5): 5 mg via INTRAVENOUS
  Filled 2021-12-20 (×5): qty 2

## 2021-12-20 MED ORDER — LACTATED RINGERS IV SOLN: INTRAVENOUS | Status: DC

## 2021-12-20 MED ORDER — PANTOPRAZOLE SODIUM 40 MG IV SOLR
40.0000 mg | INTRAVENOUS | Status: DC
  Administered 2021-12-20 – 2021-12-27 (×8): 40 mg via INTRAVENOUS
  Filled 2021-12-20 (×8): qty 10

## 2021-12-20 MED ORDER — SODIUM CHLORIDE 0.9 % IV SOLN
100.0000 mg | Freq: Two times a day (BID) | INTRAVENOUS | Status: DC
  Administered 2021-12-20 – 2021-12-21 (×4): 100 mg via INTRAVENOUS
  Filled 2021-12-20 (×7): qty 100

## 2021-12-20 NOTE — Progress Notes (Signed)
PT Cancellation Note  Patient Details Name: Steve Jensen MRN: [id number] DOB: [birth year]   Cancelled Treatment:    Reason Eval/Treat Not Completed: Patient declined, audible wheezing noted. PT will follow up as time permits to complete evaluation. Thank you for the referral.  Floria Raveling. Hartnett-Rands, MS, PT [facility] [place] [phone]  12/20/2021, 11:12 AM

## 2021-12-20 NOTE — Consult Note (Signed)
NAME:  Steve Jensen, MRN:  [id number], DOB:  08-04-1940, LOS: 4 ADMISSION DATE:  12/16/2021, CONSULTATION DATE:  12/20/2021  REFERRING MD:  Lanice Shirts, CHIEF COMPLAINT: Fever, shortness of breath  History of Present Illness:  81 year old morbidly obese man admitted 7/21 for left lower lobe community-acquired pneumonia and sepsis.  He presented with fever chills, 101, WBC 21 K, lactate 1.5, BNP 146 He was treated with ceftriaxone and doxycycline, developed fever 101 on 7/26 and was changed to cefepime on 6/21 Course complicated by ileus requiring rectal tube .  He is requiring 4 L of oxygen PCCM consulted for slow to resolve symptoms  Pertinent  Medical History  Chronic atrial fibrillation, on Eliquis HFpEF Diabetes type 2 Osteoarthritis OSA on CPAP  Significant Hospital Events: Including procedures, antibiotic start and stop dates in addition to other pertinent events     Interim History / Subjective:  Complains of abdominal distention but able to pass flatus Afebrile Small amount of stool in rectal tube Objective   Blood pressure (!) 118/58, pulse 78, temperature 97.9 F (36.6 C), temperature source Oral, resp. rate (!) 33, height '5\' 10"'$  (1.778 m), weight 131.9 kg, SpO2 94 %.        Intake/Output Summary (Last 24 hours) at 12/20/2021 1319 Last data filed at 12/20/2021 0900 Gross per 24 hour  Intake 1437.81 ml  Output --  Net 1437.81 ml   Filed Weights   12/16/21 1524 12/17/21 0003  Weight: 131.5 kg 131.9 kg    Examination: General: Obese man, sitting up in bed, on CPAP HENT: Mild pallor, no icterus, short neck unable to assess JVD Lungs: Decreased breath sounds bilateral, bilateral scattered rhonchi, no accessory muscle use Cardiovascular: S1-S2 regular distant Abdomen: Distended, tympanic, nontender, soft, bowel sounds present Extremities: No deformity 1+ edema Neuro: Alert, interactive, nonfocal no asterixis  Labs show mild hypokalemia, creatinine slight up  from 1.1-1.4, persistent leukocytosis  Chest x-ray 7/28 independently reviewed shows improved aeration of the left lower lobe  X-ray abdomen shows ileus pattern Resolved Hospital Problem list     Assessment & Plan:  Left lower lobe community-acquired pneumonia -Appears improved on chest x-ray -Complete antibiotic course for 7 days, currently cefepime -Although persistent leukocytosis, procalcitonin is low suggesting that persistent leukocytosis is due to other cause/ileus  Ileus -needs mobilization -Rectal tube decompression. -Added IV Reglan 5 mg every 6 for 6 doses -He is developing pedal edema so IV fluids will need to be decreased  Acute respiratory failure with hypoxia -Related to pneumonia and ileus -Now has bronchospasm but likely due to abdominal distention -Use DuoNebs as needed, try to avoid steroids  Patient and daughter-in-law updated at bedside plan of care  Best Practice (right click and "Reselect all SmartList Selections" daily)   Code Status:  full code Last date of multidisciplinary goals of care discussion [NA]  Labs   CBC: Recent Labs  Lab 12/16/21 1816 12/17/21 0545 12/18/21 0535 12/19/21 0536 12/20/21 0518  WBC 20.8* 20.7* 21.2* 20.2* 23.9*  NEUTROABS 15.7*  --   --   --   --   HGB 9.0* 8.5* 8.3* 8.4* 9.2*  HCT 29.3* 27.8* 28.2* 29.8* 31.1*  MCV 66.4* 66.5* 69.1* 70.1* 67.9*  PLT 681* 633* 561* 537* 873*    Basic Metabolic Panel: Recent Labs  Lab 12/16/21 1816 12/17/21 0545 12/17/21 1823 12/18/21 0535 12/19/21 0536 12/20/21 0518  NA 141 139  --  141 138 137  K 3.5 3.3*  --  3.5 3.8 3.4*  CL 106 106  --  108 107 104  CO2 25 23  --  23 20* 20*  GLUCOSE 185* 199*  --  222* 215* 214*  BUN 21 18  --  18 32* 46*  CREATININE 1.13 1.17  --  1.25* 1.59* 1.44*  CALCIUM 8.4* 8.2*  --  8.3* 8.4* 8.8*  MG  --   --  1.9  --  2.4 2.4   GFR: Estimated Creatinine Clearance: 56.8 mL/min (A) (by C-G formula based on SCr of 1.44 mg/dL (H)). Recent  Labs  Lab 12/16/21 1847 12/16/21 2104 12/17/21 0545 12/18/21 0535 12/18/21 0617 12/19/21 0536 12/20/21 0518  PROCALCITON  --   --   --   --  0.32 0.48 0.53  WBC  --   --  20.7* 21.2*  --  20.2* 23.9*  LATICACIDVEN 1.5 1.3  --   --   --   --   --     Liver Function Tests: No results for input(s): "AST", "ALT", "ALKPHOS", "BILITOT", "PROT", "ALBUMIN" in the last 168 hours. No results for input(s): "LIPASE", "AMYLASE" in the last 168 hours. No results for input(s): "AMMONIA" in the last 168 hours.  ABG No results found for: "PHART", "PCO2ART", "PO2ART", "HCO3", "TCO2", "ACIDBASEDEF", "O2SAT"   Coagulation Profile: No results for input(s): "INR", "PROTIME" in the last 168 hours.  Cardiac Enzymes: No results for input(s): "CKTOTAL", "CKMB", "CKMBINDEX", "TROPONINI" in the last 168 hours.  HbA1C: Hgb A1c MFr Bld  Date/Time Value Ref Range Status  12/17/2021 06:23 PM 7.6 (H) 4.8 - 5.6 % Final    Comment:    (NOTE) Pre diabetes:          5.7%-6.4%  Diabetes:              >6.4%  Glycemic control for   <7.0% adults with diabetes   11/19/2018 04:19 PM 7.3 (H) 4.8 - 5.6 % Final    Comment:    (NOTE) Pre diabetes:          5.7%-6.4% Diabetes:              >6.4% Glycemic control for   <7.0% adults with diabetes     CBG: Recent Labs  Lab 12/19/21 1116 12/19/21 1638 12/19/21 2102 12/20/21 0718 12/20/21 1147  GLUCAP 248* 235* 249* 223* 248*    Review of Systems:   Abdominal distention, passing flatus Shortness of breath Dry cough   Past Medical History:  He,  has a past medical history of Anemia, Diabetes mellitus type II, controlled ([place]), Dysrhythmia, GERD (gastroesophageal reflux disease), Hyperlipidemia, Hypertension, Morbid obesity ([place]), Neuromuscular disorder ([place]), OSA (obstructive sleep apnea), Prostate cancer ([place]), Shingles, and Urinary incontinence.   Surgical History:   Past Surgical History:  Procedure Laterality Date   CARDIOVERSION N/A  09/06/2013   Procedure: CARDIOVERSION;  Surgeon: [name], MD;  Location: [facility] ENDOSCOPY;  Service: Cardiovascular;  Laterality: N/A;   EYE SURGERY     cataract surgery bilat; surgery to correct droopy eyelids    IR ANGIOGRAM SELECTIVE EACH ADDITIONAL VESSEL  02/15/2018   IR ANGIOGRAM SELECTIVE EACH ADDITIONAL VESSEL  02/15/2018   IR ANGIOGRAM SELECTIVE EACH ADDITIONAL VESSEL  02/15/2018   IR ANGIOGRAM SELECTIVE EACH ADDITIONAL VESSEL  02/15/2018   IR ANGIOGRAM SELECTIVE EACH ADDITIONAL VESSEL  02/15/2018   IR ANGIOGRAM VISCERAL SELECTIVE  02/15/2018   IR ANGIOGRAM VISCERAL SELECTIVE  02/15/2018   IR EMBO TUMOR ORGAN ISCHEMIA INFARCT INC GUIDE ROADMAPPING  02/15/2018   IR RADIOLOGIST  EVAL & MGMT  01/26/2018   IR RADIOLOGIST EVAL & MGMT  03/17/2018   IR RADIOLOGIST EVAL & MGMT  06/29/2018   IR RADIOLOGIST EVAL & MGMT  07/27/2018   IR RADIOLOGIST EVAL & MGMT  10/28/2018   IR RADIOLOGIST EVAL & MGMT  12/14/2018   IR RADIOLOGIST EVAL & MGMT  03/17/2019   IR RADIOLOGIST EVAL & MGMT  07/14/2019   IR RADIOLOGIST EVAL & MGMT  12/27/2019   IR RADIOLOGIST EVAL & MGMT  08/21/2020   IR RADIOLOGIST EVAL & MGMT  02/28/2021   IR RADIOLOGIST EVAL & MGMT  06/25/2021   IR RADIOLOGIST EVAL & MGMT  10/18/2021   IR US GUIDE VASC ACCESS RIGHT  02/15/2018   knee surgery bilat      PROSTATE SURGERY     RADIOLOGY WITH ANESTHESIA N/A 11/24/2018   Procedure: MICROWAVE THERMAL ABLATION LIVER;  Surgeon: Sandi Mariscal, MD;  Location: [facility] ORS;  Service: Anesthesiology;  Laterality: N/A;   right shoulder rotator cuff surgery     TONSILLECTOMY       Social History:   reports that he quit smoking about 46 years ago. His smoking use included cigarettes. He has a 15.00 pack-year smoking history. He has never used smokeless tobacco. He reports that he does not drink alcohol and does not use drugs.   Family History:  His family history includes CVA in his brother; Cancer in his brother; Diabetes in his brother, brother, and brother;  Heart attack in his brother; Heart disease in his brother; Hypertension in his brother, brother, and brother; Prostate cancer in his brother; Stroke in his brother.   Allergies Allergies  Allergen Reactions   Lipitor [Atorvastatin] Other (See Comments)    Muscle aches   Simvastatin Other (See Comments)    Muscle aches   Zithromax [Azithromycin] Other (See Comments)    GI-SIDE EFFECTS   Amlodipine Other (See Comments)     Home Medications  Prior to Admission medications   Medication Sig Start Date End Date Taking? Authorizing Provider  allopurinol (ZYLOPRIM) 100 MG tablet Take 100 mg by mouth daily. 04/17/21  Yes [provider]  apixaban (ELIQUIS) 5 MG TABS tablet TAKE 1 TABLET(5 MG) BY MOUTH TWICE DAILY 11/15/18  Yes Turner, Traci R, MD  CINNAMON PO Take 1,000 mg by mouth daily.   Yes [provider]  diltiazem (CARDIZEM CD) 240 MG 24 hr capsule TAKE 1 CAPSULE BY MOUTH  DAILY 09/09/21  Yes [name], MD  fish oil-omega-3 fatty acids 1000 MG capsule Take 2 g by mouth 2 (two) times daily.    Yes [provider]  furosemide (LASIX) 40 MG tablet TAKE 1 TABLET BY MOUTH TWICE  DAILY AS NEEDED 11/04/21  Yes [name], MD  ibuprofen (ADVIL,MOTRIN) 200 MG tablet Take 800 mg by mouth every 6 (six) hours as needed (PAIN). prn 06/30/13  Yes [provider]  loratadine (CLARITIN) 10 MG tablet Take 10 mg by mouth daily as needed for allergies.    Yes [provider]  metFORMIN (GLUCOPHAGE) 1000 MG tablet Take 1,000 mg by mouth 2 (two) times daily. 06/27/14  Yes [provider]  metoprolol succinate (TOPROL-XL) 50 MG 24 hr tablet Take 50 mg by mouth daily.  08/10/13  Yes [provider]  Multiple Vitamins-Minerals (MULTIVITAMIN WITH MINERALS) tablet Take 1 tablet by mouth daily.   Yes [provider]  omeprazole (PRILOSEC) 20 MG capsule Take 20 mg by mouth daily.  11/29/12  Yes  [provider]  oxymetazoline  (AFRIN) 0.05 % nasal spray Place 1 spray into both nostrils 2 (two) times daily as needed for congestion.   Yes [provider]  potassium chloride SA (KLOR-CON M) 20 MEQ tablet TAKE 1 TABLET BY MOUTH  DAILY AS NEEDED WHILE  TAKING FUROSEMIDE Patient taking differently: Take 20 mEq by mouth daily. 06/28/21  Yes [name], MD  pregabalin (LYRICA) 100 MG capsule Take 100 mg by mouth 2 (two) times a day.  09/14/18  Yes [provider]  ramipril (ALTACE) 10 MG capsule Take 10 mg by mouth 2 (two) times daily. 01/11/13  Yes [provider]  tirzepatide Darcel Bayley) 5 MG/0.5ML Pen Inject 5 mg into the skin once a week.   Yes [provider]  ZETIA 10 MG tablet Take 10 mg by mouth daily.  01/17/13  Yes [provider]     Kara Mead MD. FCCP. [facility] Pager : [phone]  If no response to pager , please call [phone] until 7 pm After 7:00 pm call [place]  [phone]   12/20/2021

## 2021-12-20 NOTE — Progress Notes (Signed)
   12/20/21 0504  Assess: MEWS Score  Temp 97.7 F (36.5 C)  BP (!) 143/79  MAP (mmHg) 85  Pulse Rate (!) 58  Resp (!) 48  SpO2 92 %  O2 Device Nasal Cannula  O2 Flow Rate (L/min) 5 L/min  Assess: MEWS Score  MEWS Temp 0  MEWS Systolic 0  MEWS Pulse 0  MEWS RR 3  MEWS LOC 0  MEWS Score 3  MEWS Score Color Yellow  Assess: if the MEWS score is Yellow or Red  Were vital signs taken at a resting state? Yes  Focused Assessment Change from prior assessment (see assessment flowsheet)  Does the patient meet 2 or more of the SIRS criteria? Yes  Does the patient have a confirmed or suspected source of infection? Yes  Provider and Rapid Response Notified? Yes  MEWS guidelines implemented *See Row Information* Yes  Treat  MEWS Interventions Escalated (See documentation below)  Pain Scale 0-10  Pain Score 0  Take Vital Signs  Increase Vital Sign Frequency  Yellow: Q 2hr X 2 then Q 4hr X 2, if remains yellow, continue Q 4hrs  Escalate  MEWS: Escalate Yellow: discuss with charge nurse/RN and consider discussing with provider and RRT  Notify: Charge Nurse/RN  Name of Charge Nurse/RN Notified Kristi Marcello Moores  Date Charge Nurse/RN Notified 12/20/21  Time Charge Nurse/RN Notified 0500  Notify: Provider  Provider Name/Title Dr. Josephine Cables  Date Provider Notified 12/20/21  Time Provider Notified 0440  Method of Notification Page;Face-to-face  Notification Reason Change in status  Provider response En route;See new orders  Date of Provider Response 12/20/21  Time of Provider Response 0442  Assess: SIRS CRITERIA  SIRS Temperature  0  SIRS Pulse 0  SIRS Respirations  1  SIRS WBC 1  SIRS Score Sum  2   Called to room by respiratory at 0430, states patient was coughing and wheezing, RT administering breathing treatment at time. Patient continues wheezing and respirations 46 with not much improvement with treatment. Notified Dr. Josephine Cables to make aware, stat chest x-ray was ordered and done,  awaiting results. Dr. Josephine Cables in to see patient at 80. Assessed patient and states that wheezing sounds more so in the neck area. Awaiting chest x-ray results.

## 2021-12-20 NOTE — Progress Notes (Signed)
RN called due to patient becoming more SOB and coughing.  Respiratory called aive breathing treatment with slight improvement.  At bedside, patient presents with coarse sounds which was in the neck area and not within the lungs.  Chest x-ray ordered showed improved aeration in the left lower lobe. Patchy infiltrate or atelectasis remains but is less confluent. Encourage patient to continue with incentive spirometry.  BP (!) 143/79 (BP Location: Right Arm)   Pulse 98   Temp 97.7 F (36.5 C) (Oral)   Resp (!) 48   Ht '5\' 10"'$  (1.778 m)   Wt 131.9 kg   SpO2 92%   BMI 41.72 kg/m   Patient may require ENT to determine any upper respiratory airway obstruction/defect.  Total time:  15 minutes This includes time reviewing the chart including progress notes, labs, EKGs, taking medical decisions, ordering labs and documenting findings.

## 2021-12-20 NOTE — Progress Notes (Addendum)
   12/20/21 1045  Assess: MEWS Score  Temp 97.8 F (36.6 C)  BP 130/90  Pulse Rate 98  Resp (!) 40  SpO2 94 %  O2 Device Nasal Cannula  O2 Flow Rate (L/min) 5 L/min  Assess: MEWS Score  MEWS Temp 0  MEWS Systolic 0  MEWS Pulse 0  MEWS RR 3  MEWS LOC 0  MEWS Score 3  MEWS Score Color Yellow  Assess: if the MEWS score is Yellow or Red  Were vital signs taken at a resting state? Yes  Focused Assessment Change from prior assessment (see assessment flowsheet)  Does the patient meet 2 or more of the SIRS criteria? Yes  Does the patient have a confirmed or suspected source of infection? Yes  Provider and Rapid Response Notified? Yes (Provider)  MEWS guidelines implemented *See Row Information* Yes  Treat  MEWS Interventions Administered scheduled meds/treatments  Pain Score 0  Take Vital Signs  Increase Vital Sign Frequency  Yellow: Q 2hr X 2 then Q 4hr X 2, if remains yellow, continue Q 4hrs  Escalate  MEWS: Escalate Yellow: discuss with charge nurse/RN and consider discussing with provider and RRT  Notify: Charge Nurse/RN  Name of Charge Nurse/RN Notified Izora Gala, RN  Date Charge Nurse/RN Notified 12/20/21  Time Charge Nurse/RN Notified 1100  Document  Patient Outcome Other (Comment) (no change is patient condition- RR has been high)  Progress note created (see row info) Yes  Assess: SIRS CRITERIA  SIRS Temperature  0  SIRS Pulse 1  SIRS Respirations  1  SIRS WBC 1  SIRS Score Sum  3   Discussed with attending. MD states patient will be seen by pulmonology.

## 2021-12-20 NOTE — Progress Notes (Signed)
PROGRESS NOTE    CREW GOREN  IRC:[id number] DOB: 01/07/1941 DOA: 12/16/2021 PCP: [name], MD   Brief Narrative:    Steve Jensen  is a 81 y.o. male reformed smoker with past medical history relevant for morbid obesity/OSA, chronic A-fib status post prior cardioversion chronically on Eliquis for stroke prophylaxis, HTN, chronic diastolic dysfunction CHF and advanced osteoarthritis as well as diabetes mellitus who presents to the ED with complaints of productive cough with colored sputum, shortness of breath and dyspnea on exertion since 12/13/2021.  Patient was admitted for severe sepsis secondary to community-acquired pneumonia, present on admission.  He is also noted to have associated acute hypoxemic respiratory failure.  He now has ileus for which rectal tube has been placed.  Pulmonology consulted due to ongoing hypoxemia and poor progression.  Assessment & Plan:   Principal Problem:   Pneumonia Active Problems:   Sepsis due to CAP/PNA   Essential hypertension, benign   Obstructive sleep apnea   Morbid obesity (HCC)   Atrial fibrillation ([place])   Uncontrolled type 2 diabetes mellitus with hyperglycemia, without long-term current use of insulin (HCC)   Osteoarthritis of right knee  Assessment and Plan:   Severe sepsis, present on admission, due to CAP/PNA-ongoing - Patient met criteria for severe sepsis due to pneumonia at time of admission; febrile, elevated WBCs, tachycardic/tachypneic, source of infection left lower lobe pneumonia and organ dysfunction his lungs with the presence of hypoxia requiring oxygen supplementation. -As mentioned above continue maintaining adequate hydration, IV antibiotics and further treatment of his pneumonia. -Repeat blood cultures with fever overnight and check urine cultures and chest x-ray -Increase Rocephin to cefepime and continue doxycycline, now IV  -MRSA PCR negative -Appreciate pulmonology evaluation   Acute hypoxemic  respiratory failure secondary to above -Currently on 4 L nasal cannula oxygen, continue to wean as tolerated -Chest x-ray 7/27 with atelectasis -Incentive spirometry -Up in chair with ambulation  Ileus -Started rectal tube 7/27 -May require GI involvement for decompression over the weekend if he does not continue to improve -Medication switch to IV   Osteoarthritis of right knee - Patient complaining of significant pain -Voltaren gel and as needed tramadol will be added to assist with discomfort -Outpatient follow-up with orthopedic service as he has been advised of the need for knee replacement.   Uncontrolled type 2 diabetes mellitus with hyperglycemia, without long-term current use of insulin (HCC) with hyperglycemia -A1c 7.6  -Continue sliding scale insulin; NovoLog for meal coverage added. -Follow CBGs fluctuation and adjust hypoglycemic regimen as required -While inpatient continue holding oral hypoglycemic agents. -Semglee to 13 units daily 7/27   Atrial fibrillation (HCC) - Paroxysmal in nature -Continue telemetry monitoring -Rate and rhythm stable at this time -Continue metoprolol IV and Cardizem for rate control -Continue on full dose Lovenox for now given ileus   Morbid obesity (HCC) -Body mass index is 41.72 kg/m. -Low calorie diet, portion control and increase physical activity discussed with patient.   Obstructive sleep apnea - Continue the use of CPAP nightly   Essential hypertension, benign - Stable overall -Continue current antihypertensive agent -Heart healthy diet discussed with patient.  Mild hypokalemia -Replete IV and recheck in a.m.     DVT prophylaxis: Currently full dose Lovenox Code Status: Full Family Communication: Discussed with daughter-in-law 7/27 on phone Disposition Plan:  Status is: Inpatient Remains inpatient appropriate because: Currently on IV antibiotics   Consultants:  Pulmonology   Procedures:  None   Antimicrobials:   Anti-infectives (From admission, onward)  Start     Dose/Rate Route Frequency Ordered Stop   12/20/21 1000  doxycycline (VIBRAMYCIN) 100 mg in sodium chloride 0.9 % 250 mL IVPB        100 mg 125 mL/hr over 120 Minutes Intravenous Every 12 hours 12/20/21 0705     12/19/21 0830  ceFEPIme (MAXIPIME) 2 g in sodium chloride 0.9 % 100 mL IVPB        2 g 200 mL/hr over 30 Minutes Intravenous Every 12 hours 12/19/21 0738     12/17/21 0000  cefTRIAXone (ROCEPHIN) 1 g in sodium chloride 0.9 % 100 mL IVPB  Status:  Discontinued        1 g 200 mL/hr over 30 Minutes Intravenous Every 24 hours 12/16/21 2150 12/19/21 0704   12/16/21 2200  doxycycline (VIBRA-TABS) tablet 100 mg  Status:  Discontinued        100 mg Oral Every 12 hours 12/16/21 2150 12/20/21 0704   12/16/21 1945  cefTRIAXone (ROCEPHIN) 1 g in sodium chloride 0.9 % 100 mL IVPB        1 g 200 mL/hr over 30 Minutes Intravenous  Once 12/16/21 1939 12/16/21 2156   12/16/21 1945  doxycycline (VIBRA-TABS) tablet 100 mg        100 mg Oral  Once 12/16/21 1939 12/16/21 2000       Subjective: Patient seen and evaluated today with no new acute complaints or concerns.  Noted to have some upper airway wheezing last night.  He continues to remain on nasal cannula oxygen.  Ileus seems to be mildly improved after rectal tube placement.  Objective: Vitals:   12/20/21 0704 12/20/21 0823 12/20/21 1045 12/20/21 1245  BP: 120/64  130/90 (!) 118/58  Pulse: (!) 51  98 78  Resp: (!) 24  (!) 40 (!) 33  Temp: 98 F (36.7 C)  97.8 F (36.6 C) 97.9 F (36.6 C)  TempSrc: Oral  Oral Oral  SpO2: 95% 92% 94% 94%  Weight:      Height:        Intake/Output Summary (Last 24 hours) at 12/20/2021 1307 Last data filed at 12/20/2021 0900 Gross per 24 hour  Intake 1437.81 ml  Output --  Net 1437.81 ml   Filed Weights   12/16/21 1524 12/17/21 0003  Weight: 131.5 kg 131.9 kg    Examination:  General exam: Appears calm and comfortable  Respiratory  system: Clear to auscultation. Respiratory effort normal.  Currently on 5 L nasal cannula oxygen Cardiovascular system: S1 & S2 heard, RRR.  Gastrointestinal system: Abdomen is distended, nontender Central nervous system: Alert and awake Extremities: No edema Skin: No significant lesions noted Psychiatry: Flat affect.    Data Reviewed: I have personally reviewed following labs and imaging studies  CBC: Recent Labs  Lab 12/16/21 1816 12/17/21 0545 12/18/21 0535 12/19/21 0536 12/20/21 0518  WBC 20.8* 20.7* 21.2* 20.2* 23.9*  NEUTROABS 15.7*  --   --   --   --   HGB 9.0* 8.5* 8.3* 8.4* 9.2*  HCT 29.3* 27.8* 28.2* 29.8* 31.1*  MCV 66.4* 66.5* 69.1* 70.1* 67.9*  PLT 681* 633* 561* 537* 161*   Basic Metabolic Panel: Recent Labs  Lab 12/16/21 1816 12/17/21 0545 12/17/21 1823 12/18/21 0535 12/19/21 0536 12/20/21 0518  NA 141 139  --  141 138 137  K 3.5 3.3*  --  3.5 3.8 3.4*  CL 106 106  --  108 107 104  CO2 25 23  --  23 20* 20*  GLUCOSE 185* 199*  --  222* 215* 214*  BUN 21 18  --  18 32* 46*  CREATININE 1.13 1.17  --  1.25* 1.59* 1.44*  CALCIUM 8.4* 8.2*  --  8.3* 8.4* 8.8*  MG  --   --  1.9  --  2.4 2.4   GFR: Estimated Creatinine Clearance: 56.8 mL/min (A) (by C-G formula based on SCr of 1.44 mg/dL (H)). Liver Function Tests: No results for input(s): "AST", "ALT", "ALKPHOS", "BILITOT", "PROT", "ALBUMIN" in the last 168 hours. No results for input(s): "LIPASE", "AMYLASE" in the last 168 hours. No results for input(s): "AMMONIA" in the last 168 hours. Coagulation Profile: No results for input(s): "INR", "PROTIME" in the last 168 hours. Cardiac Enzymes: No results for input(s): "CKTOTAL", "CKMB", "CKMBINDEX", "TROPONINI" in the last 168 hours. BNP (last 3 results) Recent Labs    02/18/21 1555  PROBNP 99   HbA1C: Recent Labs    12/17/21 1823  HGBA1C 7.6*   CBG: Recent Labs  Lab 12/19/21 1116 12/19/21 1638 12/19/21 2102 12/20/21 0718 12/20/21 1147   GLUCAP 248* 235* 249* 223* 248*   Lipid Profile: No results for input(s): "CHOL", "HDL", "LDLCALC", "TRIG", "CHOLHDL", "LDLDIRECT" in the last 72 hours. Thyroid Function Tests: No results for input(s): "TSH", "T4TOTAL", "FREET4", "T3FREE", "THYROIDAB" in the last 72 hours. Anemia Panel: No results for input(s): "VITAMINB12", "FOLATE", "FERRITIN", "TIBC", "IRON", "RETICCTPCT" in the last 72 hours. Sepsis Labs: Recent Labs  Lab 12/16/21 1847 12/16/21 2104 12/18/21 0617 12/19/21 0536 12/20/21 0518  PROCALCITON  --   --  0.32 0.48 0.53  LATICACIDVEN 1.5 1.3  --   --   --     Recent Results (from the past 240 hour(s))  Culture, blood (routine x 2)     Status: None (Preliminary result)   Collection Time: 12/16/21  6:47 PM   Specimen: BLOOD  Result Value Ref Range Status   Specimen Description   Final    BLOOD LEFT ANTECUBITAL Performed at [facility], [employer] [address] [place], [place] [address]    Special Requests   Final    BOTTLES DRAWN AEROBIC AND ANAEROBIC Blood Culture results may not be optimal due to an excessive volume of blood received in culture bottles Performed at [facility] [address], [place], [place] [address]    Culture   Final    NO GROWTH 4 DAYS Performed at [facility], [address] [place], [place] [address]    Report Status PENDING  Incomplete  Culture, blood (routine x 2)     Status: None (Preliminary result)   Collection Time: 12/16/21  6:52 PM   Specimen: BLOOD  Result Value Ref Range Status   Specimen Description   Final    BLOOD RIGHT ANTECUBITAL Performed at [facility], [place] [address] [place] [address], [place], [place] [address]    Special Requests   Final    BOTTLES DRAWN AEROBIC AND ANAEROBIC Blood Culture adequate volume Performed at [facility], [place] [address], [place], [place] [address]    Culture   Final    NO GROWTH 4 DAYS Performed at [facility], [address] [place],  [address]    Report Status  PENDING  Incomplete  SARS Coronavirus 2 by RT PCR (hospital order, performed in [facility] hospital lab) *cepheid single result test* Anterior Nasal Swab     Status: None   Collection Time: 12/16/21  8:07 PM   Specimen: Anterior Nasal Swab  Result Value Ref Range Status   SARS Coronavirus 2 by RT PCR NEGATIVE NEGATIVE Final    Comment: (NOTE) SARS-CoV-2 target nucleic acids are NOT DETECTED.  The SARS-CoV-2 RNA is generally detectable in upper and lower respiratory specimens during the acute phase of infection. The lowest concentration of SARS-CoV-2 viral copies this assay can detect is 250 copies / mL. A negative result does not preclude SARS-CoV-2 infection and should not be used as the sole basis for treatment or other patient management decisions.  A negative result may occur with improper specimen collection / handling, submission of specimen other than nasopharyngeal swab, presence of viral mutation(s) within the areas targeted by this assay, and inadequate number of viral copies (<250 copies / mL). A negative result must be combined with clinical observations, patient history, and epidemiological information.  Fact Sheet for Patients:   https://www.patel.info/  Fact Sheet for Healthcare Providers: https://hall.com/  This test is not yet approved or  cleared by the [place] FDA and has been authorized for detection and/or diagnosis of SARS-CoV-2 by FDA under an Emergency Use Authorization (EUA).  This EUA will remain in effect (meaning this test can be used) for the duration of the COVID-19 declaration under Section 564(b)(1) of the Act, 21 U.S.C. section 360bbb-3(b)(1), unless the authorization is terminated or revoked sooner.  Performed at [facility], [address], [place], [place] [address]   MRSA Next Gen by PCR, Nasal     Status: None   Collection Time: 12/19/21  7:05 AM   Specimen: Nasal Mucosa; Nasal Swab  Result  Value Ref Range Status   MRSA by PCR Next Gen NOT DETECTED NOT DETECTED Final    Comment: (NOTE) The GeneXpert MRSA Assay (FDA approved for NASAL specimens only), is one component of a comprehensive MRSA colonization surveillance program. It is not intended to diagnose MRSA infection nor to guide or monitor treatment for MRSA infections. Test performance is not FDA approved in patients less than 39 years old. Performed at [facility], [address], [place], [place] [address]   Culture, blood (Routine X 2) w Reflex to ID Panel     Status: None (Preliminary result)   Collection Time: 12/19/21  7:11 AM   Specimen: BLOOD RIGHT HAND  Result Value Ref Range Status   Specimen Description BLOOD RIGHT HAND  Final   Special Requests   Final    BOTTLES DRAWN AEROBIC AND ANAEROBIC Blood Culture adequate volume   Culture   Final    NO GROWTH < 24 HOURS Performed at [facility], [address], [place], [place] [address]    Report Status PENDING  Incomplete  Culture, blood (Routine X 2) w Reflex to ID Panel     Status: None (Preliminary result)   Collection Time: 12/19/21  7:12 AM   Specimen: Right Antecubital; Blood  Result Value Ref Range Status   Specimen Description RIGHT ANTECUBITAL  Final   Special Requests   Final    BOTTLES DRAWN AEROBIC AND ANAEROBIC Blood Culture adequate volume   Culture   Final    NO GROWTH < 24 HOURS Performed at [facility], [address], [place], [place] [address]    Report Status PENDING  Incomplete         Radiology Studies: DG Chest 1 View  Result Date: 12/20/2021 CLINICAL DATA:  Increasing shortness of breath. EXAM: CHEST  1 VIEW COMPARISON:  [place] chest yesterday at 9:33 a.m. FINDINGS: 4:54 a.m. There is interval partial clearance of  patchy atelectasis or pneumonic infiltrate from the left lower lobe. Linear scarring or atelectasis in the left mid lung continues to be seen. Remaining lungs generally clear. No pleural effusion is evident.  There is aortic calcification and tortuosity with stable mediastinum. There is mild cardiomegaly. Central vessels are normal caliber. There is thoracic spondylosis. IMPRESSION: Improved aeration in the left lower lobe. Patchy infiltrate or atelectasis remains but is less confluent. Electronically Signed   By: Telford Nab M.D.   On: 12/20/2021 05:31   DG Abd 1 View  Result Date: 12/19/2021 CLINICAL DATA:  Ileus EXAM: ABDOMEN - 1 VIEW COMPARISON:  CT 10/15/2021 FINDINGS: Gaseous distension of large and small bowel loops throughout the abdomen. No gross free intraperitoneal air. No radio-opaque calculi or other significant radiographic abnormality are seen. IMPRESSION: Gaseous distension of large and small bowel loops throughout the abdomen, most suggestive of ileus. Electronically Signed   By: Davina Poke D.O.   On: 12/19/2021 12:03   DG CHEST PORT 1 VIEW  Result Date: 12/19/2021 CLINICAL DATA:  Cough with shortness of breath and chest pain EXAM: PORTABLE CHEST 1 VIEW COMPARISON:  Chest x-ray dated December 16, 2021 FINDINGS: Cardiac and mediastinal contours are unchanged when accounting for AP technique and lung volumes. Low lung volumes with hypoventilatory changes. Mild left basilar opacity which is likely due to atelectasis. No large pleural effusion or pneumothorax. Numerous dilated gas-filled loops of bowel are seen in the partially visualized upper abdomen. IMPRESSION: 1. Low lung volumes with hypoventilatory changes. Mild left basilar opacity which is likely due to atelectasis. 2. Numerous dilated gas-filled loops of bowel seen in the partially visualized upper abdomen, recommend clinical correlation and consider dedicated abdominal radiograph for further evaluation. Electronically Signed   By: Yetta Glassman M.D.   On: 12/19/2021 09:44        Scheduled Meds:  benzonatate  100 mg Oral TID   bisacodyl  10 mg Rectal Once   budesonide (PULMICORT) nebulizer solution  0.5 mg Nebulization BID    dextromethorphan-guaiFENesin  1 tablet Oral BID   diclofenac Sodium  4 g Topical QID   diclofenac Sodium  4 g Topical QID   diltiazem  240 mg Oral Daily   enoxaparin (LOVENOX) injection  130 mg Subcutaneous Q12H   insulin aspart  0-5 Units Subcutaneous QHS   insulin aspart  0-6 Units Subcutaneous TID WC   insulin aspart  4 Units Subcutaneous TID WC   insulin glargine-yfgn  13 Units Subcutaneous Daily   metoprolol tartrate  5 mg Intravenous Q8H   pantoprazole (PROTONIX) IV  40 mg Intravenous Q24H   pregabalin  100 mg Oral BID   sodium chloride flush  3 mL Intravenous Q12H   sodium chloride flush  3 mL Intravenous Q12H   Continuous Infusions:  sodium chloride     ceFEPime (MAXIPIME) IV 2 g (12/20/21 0758)   doxycycline (VIBRAMYCIN) IV     lactated ringers 75 mL/hr at 12/20/21 0836     LOS: 4 days    Time spent: 35 minutes    Vernadine Coombs D Manuella Ghazi, DO [facility]  If 7PM-7AM, please contact night-coverage [email or web address] 12/20/2021, 1:07 PM

## 2021-12-21 ENCOUNTER — Inpatient Hospital Stay (HOSPITAL_COMMUNITY): Payer: Medicare Other

## 2021-12-21 DIAGNOSIS — K567 Ileus, unspecified: Secondary | ICD-10-CM | POA: Diagnosis not present

## 2021-12-21 DIAGNOSIS — J189 Pneumonia, unspecified organism: Secondary | ICD-10-CM | POA: Diagnosis not present

## 2021-12-21 LAB — GLUCOSE, CAPILLARY
Glucose-Capillary: 178 mg/dL — ABNORMAL HIGH (ref 70–99)
Glucose-Capillary: 231 mg/dL — ABNORMAL HIGH (ref 70–99)
Glucose-Capillary: 193 mg/dL — ABNORMAL HIGH (ref 70–99)
Glucose-Capillary: 166 mg/dL — ABNORMAL HIGH (ref 70–99)

## 2021-12-21 LAB — CBC
HCT: 28.6 % — ABNORMAL LOW (ref 39.0–52.0)
Hemoglobin: 8.4 g/dL — ABNORMAL LOW (ref 13.0–17.0)
MCH: 20 pg — ABNORMAL LOW (ref 26.0–34.0)
MCHC: 29.4 g/dL — ABNORMAL LOW (ref 30.0–36.0)
MCV: 67.9 fL — ABNORMAL LOW (ref 80.0–100.0)
Platelets: 799 10*3/uL — ABNORMAL HIGH (ref 150–400)
RBC: 4.21 MIL/uL — ABNORMAL LOW (ref 4.22–5.81)
RDW: 19.7 % — ABNORMAL HIGH (ref 11.5–15.5)
WBC: 20.9 10*3/uL — ABNORMAL HIGH (ref 4.0–10.5)
nRBC: 0.2 % (ref 0.0–0.2)

## 2021-12-21 LAB — CULTURE, BLOOD (ROUTINE X 2)
Culture: NO GROWTH
Culture: NO GROWTH
Special Requests: ADEQUATE

## 2021-12-21 LAB — BASIC METABOLIC PANEL
Anion gap: 9 (ref 5–15)
BUN: 57 mg/dL — ABNORMAL HIGH (ref 8–23)
CO2: 20 mmol/L — ABNORMAL LOW (ref 22–32)
Calcium: 8.6 mg/dL — ABNORMAL LOW (ref 8.9–10.3)
Chloride: 108 mmol/L (ref 98–111)
Creatinine, Ser: 1.52 mg/dL — ABNORMAL HIGH (ref 0.61–1.24)
GFR, Estimated: 46 mL/min — ABNORMAL LOW (ref >60)
Glucose, Bld: 185 mg/dL — ABNORMAL HIGH (ref 70–99)
Potassium: 3.3 mmol/L — ABNORMAL LOW (ref 3.5–5.1)
Sodium: 137 mmol/L (ref 135–145)

## 2021-12-21 LAB — MAGNESIUM: Magnesium: 2.4 mg/dL (ref 1.7–2.4)

## 2021-12-21 LAB — ALBUMIN: Albumin: 2.3 g/dL — ABNORMAL LOW (ref 3.5–5.0)

## 2021-12-21 MED ORDER — POTASSIUM CHLORIDE 10 MEQ/100ML IV SOLN
10.0000 meq | INTRAVENOUS | Status: AC
  Administered 2021-12-21 (×4): 10 meq via INTRAVENOUS
  Filled 2021-12-21 (×4): qty 100

## 2021-12-21 MED ORDER — ORAL CARE MOUTH RINSE
15.0000 mL | OROMUCOSAL | Status: DC | PRN
  Filled 2021-12-21: qty 15

## 2021-12-21 MED ORDER — METOPROLOL SUCCINATE ER 50 MG PO TB24
50.0000 mg | ORAL_TABLET | Freq: Every day | ORAL | Status: DC
  Administered 2021-12-21 – 2021-12-27 (×7): 50 mg via ORAL
  Filled 2021-12-21 (×8): qty 1

## 2021-12-21 MED ORDER — ALPRAZOLAM 0.25 MG PO TABS
0.2500 mg | ORAL_TABLET | Freq: Two times a day (BID) | ORAL | Status: DC | PRN
  Administered 2021-12-21 – 2021-12-25 (×5): 0.25 mg via ORAL
  Filled 2021-12-21 (×5): qty 1

## 2021-12-21 MED ORDER — POLYETHYLENE GLYCOL 3350 17 G PO PACK
17.0000 g | PACK | Freq: Two times a day (BID) | ORAL | Status: DC
  Administered 2021-12-21 – 2021-12-24 (×7): 17 g via ORAL
  Filled 2021-12-21 (×7): qty 1

## 2021-12-21 NOTE — Progress Notes (Signed)
PROGRESS NOTE    Steve Jensen  YBO:[id number] DOB: Oct 12, 1940 DOA: 12/16/2021 PCP: [name], MD   Brief Narrative:    Steve Jensen  is a 81 y.o. male reformed smoker with past medical history relevant for morbid obesity/OSA, chronic A-fib status post prior cardioversion chronically on Eliquis for stroke prophylaxis, HTN, chronic diastolic dysfunction CHF and advanced osteoarthritis as well as diabetes mellitus who presents to the ED with complaints of productive cough with colored sputum, shortness of breath and dyspnea on exertion since 12/13/2021.  Patient was admitted for severe sepsis secondary to community-acquired pneumonia, present on admission.  He is also noted to have associated acute hypoxemic respiratory failure.  He now has ileus for which rectal tube has been placed.  Pulmonology consulted due to ongoing hypoxemia and poor progression.  Assessment & Plan:   Principal Problem:   Pneumonia Active Problems:   Sepsis due to CAP/PNA   Essential hypertension, benign   Obstructive sleep apnea   Morbid obesity (HCC)   Atrial fibrillation ([place])   Uncontrolled type 2 diabetes mellitus with hyperglycemia, without long-term current use of insulin (HCC)   Osteoarthritis of right knee  Assessment and Plan:   Severe sepsis, present on admission, due to CAP/PNA-ongoing - Patient met criteria for severe sepsis due to pneumonia at time of admission; febrile, elevated WBCs, tachycardic/tachypneic, source of infection left lower lobe pneumonia and organ dysfunction his lungs with the presence of hypoxia requiring oxygen supplementation. -As mentioned above continue maintaining adequate hydration, IV antibiotics and further treatment of his pneumonia. -Repeat blood cultures with fever overnight and check urine cultures and chest x-ray -Increase Rocephin to cefepime and continue doxycycline, now IV day 6/7 -MRSA PCR negative -Appreciate pulmonology evaluation   Acute hypoxemic  respiratory failure secondary to above -Currently on 4 L nasal cannula oxygen, continue to wean as tolerated -Chest x-ray 7/27 with atelectasis -Incentive spirometry -Up in chair with ambulation   Ileus -Started rectal tube 7/27 -Reglan ordered per PCCM -Consult to GI today to consider colonic decompression -Medication switch to IV   Osteoarthritis of right knee - Patient complaining of significant pain -Voltaren gel and as needed tramadol will be added to assist with discomfort -Outpatient follow-up with orthopedic service as he has been advised of the need for knee replacement.   Uncontrolled type 2 diabetes mellitus with hyperglycemia, without long-term current use of insulin (HCC) with hyperglycemia -A1c 7.6  -Continue sliding scale insulin; NovoLog for meal coverage added. -Follow CBGs fluctuation and adjust hypoglycemic regimen as required -While inpatient continue holding oral hypoglycemic agents. -Semglee to 13 units daily 7/27   Atrial fibrillation (HCC) - Paroxysmal in nature -Continue telemetry monitoring -Rate and rhythm stable at this time -Continue metoprolol IV and Cardizem for rate control -Continue on full dose Lovenox for now given ileus   Morbid obesity (HCC) -Body mass index is 41.72 kg/m. -Low calorie diet, portion control and increase physical activity discussed with patient.   Obstructive sleep apnea - Continue the use of CPAP nightly   Essential hypertension, benign - Stable overall -Continue current antihypertensive agent -Heart healthy diet discussed with patient.   Mild hypokalemia -Replete IV and recheck in a.m.     DVT prophylaxis: Currently full dose Lovenox Code Status: Full Family Communication: Discussed with daughter-in-law 7/27 on phone Disposition Plan:  Status is: Inpatient Remains inpatient appropriate because: Currently on IV antibiotics   Consultants:  Pulmonology GI   Procedures:  None   Antimicrobials:   Anti-infectives (From admission, onward)  Start     Dose/Rate Route Frequency Ordered Stop   12/20/21 1000  doxycycline (VIBRAMYCIN) 100 mg in sodium chloride 0.9 % 250 mL IVPB        100 mg 125 mL/hr over 120 Minutes Intravenous Every 12 hours 12/20/21 0705     12/19/21 0830  ceFEPIme (MAXIPIME) 2 g in sodium chloride 0.9 % 100 mL IVPB        2 g 200 mL/hr over 30 Minutes Intravenous Every 12 hours 12/19/21 0738     12/17/21 0000  cefTRIAXone (ROCEPHIN) 1 g in sodium chloride 0.9 % 100 mL IVPB  Status:  Discontinued        1 g 200 mL/hr over 30 Minutes Intravenous Every 24 hours 12/16/21 2150 12/19/21 0704   12/16/21 2200  doxycycline (VIBRA-TABS) tablet 100 mg  Status:  Discontinued        100 mg Oral Every 12 hours 12/16/21 2150 12/20/21 0704   12/16/21 1945  cefTRIAXone (ROCEPHIN) 1 g in sodium chloride 0.9 % 100 mL IVPB        1 g 200 mL/hr over 30 Minutes Intravenous  Once 12/16/21 1939 12/16/21 2156   12/16/21 1945  doxycycline (VIBRA-TABS) tablet 100 mg        100 mg Oral  Once 12/16/21 1939 12/16/21 2000      Subjective: Patient seen and evaluated today with ongoing abdominal distention and cough.  He does not appear to have had any significant improvement with his distention with rectal tube and Reglan.  Slept well overnight.  Objective: Vitals:   12/20/21 1845 12/20/21 2231 12/21/21 0556 12/21/21 0812  BP: (!) 103/50 (!) 148/82 138/62   Pulse: 80 85 79   Resp: (!) 30 (!) 28    Temp:  98.4 F (36.9 C) 98.2 F (36.8 C)   TempSrc:  Axillary Oral   SpO2: 93% 96% 94% 91%  Weight:      Height:        Intake/Output Summary (Last 24 hours) at 12/21/2021 0914 Last data filed at 12/21/2021 0900 Gross per 24 hour  Intake 1562.76 ml  Output 1450 ml  Net 112.76 ml   Filed Weights   12/16/21 1524 12/17/21 0003  Weight: 131.5 kg 131.9 kg    Examination:  General exam: Appears calm and comfortable  Respiratory system: Clear to auscultation. Respiratory effort  normal.  Nasal cannula oxygen. Cardiovascular system: S1 & S2 heard, RRR.  Gastrointestinal system: Abdomen is distended Central nervous system: Alert and awake Extremities: No edema Skin: No significant lesions noted Psychiatry: Flat affect.    Data Reviewed: I have personally reviewed following labs and imaging studies  CBC: Recent Labs  Lab 12/16/21 1816 12/17/21 0545 12/18/21 0535 12/19/21 0536 12/20/21 0518 12/21/21 0526  WBC 20.8* 20.7* 21.2* 20.2* 23.9* 20.9*  NEUTROABS 15.7*  --   --   --   --   --   HGB 9.0* 8.5* 8.3* 8.4* 9.2* 8.4*  HCT 29.3* 27.8* 28.2* 29.8* 31.1* 28.6*  MCV 66.4* 66.5* 69.1* 70.1* 67.9* 67.9*  PLT 681* 633* 561* 537* 873* 128*   Basic Metabolic Panel: Recent Labs  Lab 12/17/21 0545 12/17/21 1823 12/18/21 0535 12/19/21 0536 12/20/21 0518 12/21/21 0526  NA 139  --  141 138 137 137  K 3.3*  --  3.5 3.8 3.4* 3.3*  CL 106  --  108 107 104 108  CO2 23  --  23 20* 20* 20*  GLUCOSE 199*  --  222* 215*  214* 185*  BUN 18  --  18 32* 46* 57*  CREATININE 1.17  --  1.25* 1.59* 1.44* 1.52*  CALCIUM 8.2*  --  8.3* 8.4* 8.8* 8.6*  MG  --  1.9  --  2.4 2.4 2.4   GFR: Estimated Creatinine Clearance: 53.8 mL/min (A) (by C-G formula based on SCr of 1.52 mg/dL (H)). Liver Function Tests: No results for input(s): "AST", "ALT", "ALKPHOS", "BILITOT", "PROT", "ALBUMIN" in the last 168 hours. No results for input(s): "LIPASE", "AMYLASE" in the last 168 hours. No results for input(s): "AMMONIA" in the last 168 hours. Coagulation Profile: No results for input(s): "INR", "PROTIME" in the last 168 hours. Cardiac Enzymes: No results for input(s): "CKTOTAL", "CKMB", "CKMBINDEX", "TROPONINI" in the last 168 hours. BNP (last 3 results) Recent Labs    02/18/21 1555  PROBNP 99   HbA1C: No results for input(s): "HGBA1C" in the last 72 hours. CBG: Recent Labs  Lab 12/20/21 0718 12/20/21 1147 12/20/21 1655 12/20/21 2138 12/21/21 0724  GLUCAP 223* 248*  201* 212* 178*   Lipid Profile: No results for input(s): "CHOL", "HDL", "LDLCALC", "TRIG", "CHOLHDL", "LDLDIRECT" in the last 72 hours. Thyroid Function Tests: No results for input(s): "TSH", "T4TOTAL", "FREET4", "T3FREE", "THYROIDAB" in the last 72 hours. Anemia Panel: No results for input(s): "VITAMINB12", "FOLATE", "FERRITIN", "TIBC", "IRON", "RETICCTPCT" in the last 72 hours. Sepsis Labs: Recent Labs  Lab 12/16/21 1847 12/16/21 2104 12/18/21 0617 12/19/21 0536 12/20/21 0518  PROCALCITON  --   --  0.32 0.48 0.53  LATICACIDVEN 1.5 1.3  --   --   --     Recent Results (from the past 240 hour(s))  Culture, blood (routine x 2)     Status: None   Collection Time: 12/16/21  6:47 PM   Specimen: BLOOD  Result Value Ref Range Status   Specimen Description   Final    BLOOD LEFT ANTECUBITAL Performed at [facility], [place] [address] [place], [place] [address]    Special Requests   Final    BOTTLES DRAWN AEROBIC AND ANAEROBIC Blood Culture results may not be optimal due to an excessive volume of blood received in culture bottles Performed at [place] [address], [place], [place] [address]    Culture   Final    NO GROWTH 5 DAYS Performed at [facility], [address], [place], [place] [address]    Report Status 12/21/2021 FINAL  Final  Culture, blood (routine x 2)     Status: None   Collection Time: 12/16/21  6:52 PM   Specimen: BLOOD  Result Value Ref Range Status   Specimen Description   Final    BLOOD RIGHT ANTECUBITAL Performed at [facility], [place] [address], [place], [place] [address]    Special Requests   Final    BOTTLES DRAWN AEROBIC AND ANAEROBIC Blood Culture adequate volume Performed at [facility], [place] [address] [place], [place] [address]    Culture   Final    NO GROWTH 5 DAYS Performed at [facility], [address], [place], [place] [address]    Report Status 12/21/2021 FINAL  Final  SARS Coronavirus 2 by RT PCR  (hospital order, performed in [facility] hospital lab) *cepheid single result test* Anterior Nasal Swab     Status: None   Collection Time: 12/16/21  8:07 PM   Specimen: Anterior Nasal Swab  Result Value Ref Range Status   SARS Coronavirus 2 by RT PCR  NEGATIVE NEGATIVE Final    Comment: (NOTE) SARS-CoV-2 target nucleic acids are NOT DETECTED.  The SARS-CoV-2 RNA is generally detectable in upper and lower respiratory specimens during the acute phase of infection. The lowest concentration of SARS-CoV-2 viral copies this assay can detect is 250 copies / mL. A negative result does not preclude SARS-CoV-2 infection and should not be used as the sole basis for treatment or other patient management decisions.  A negative result may occur with improper specimen collection / handling, submission of specimen other than nasopharyngeal swab, presence of viral mutation(s) within the areas targeted by this assay, and inadequate number of viral copies (<250 copies / mL). A negative result must be combined with clinical observations, patient history, and epidemiological information.  Fact Sheet for Patients:   https://www.patel.info/  Fact Sheet for Healthcare Providers: https://hall.com/  This test is not yet approved or  cleared by the [place] FDA and has been authorized for detection and/or diagnosis of SARS-CoV-2 by FDA under an Emergency Use Authorization (EUA).  This EUA will remain in effect (meaning this test can be used) for the duration of the COVID-19 declaration under Section 564(b)(1) of the Act, 21 U.S.C. section 360bbb-3(b)(1), unless the authorization is terminated or revoked sooner.  Performed at [facility], [address], [place], [place] [address]   MRSA Next Gen by PCR, Nasal     Status: None   Collection Time: 12/19/21  7:05 AM   Specimen: Nasal Mucosa; Nasal Swab  Result Value Ref Range Status   MRSA by PCR Next Gen NOT  DETECTED NOT DETECTED Final    Comment: (NOTE) The GeneXpert MRSA Assay (FDA approved for NASAL specimens only), is one component of a comprehensive MRSA colonization surveillance program. It is not intended to diagnose MRSA infection nor to guide or monitor treatment for MRSA infections. Test performance is not FDA approved in patients less than 36 years old. Performed at [facility], [address] [place], [place] [address]   Culture, blood (Routine X 2) w Reflex to ID Panel     Status: None (Preliminary result)   Collection Time: 12/19/21  7:11 AM   Specimen: BLOOD RIGHT HAND  Result Value Ref Range Status   Specimen Description BLOOD RIGHT HAND  Final   Special Requests   Final    BOTTLES DRAWN AEROBIC AND ANAEROBIC Blood Culture adequate volume   Culture   Final    NO GROWTH 2 DAYS Performed at [facility], [address] [place], [place] [address]    Report Status PENDING  Incomplete  Culture, blood (Routine X 2) w Reflex to ID Panel     Status: None (Preliminary result)   Collection Time: 12/19/21  7:12 AM   Specimen: Right Antecubital; Blood  Result Value Ref Range Status   Specimen Description RIGHT ANTECUBITAL  Final   Special Requests   Final    BOTTLES DRAWN AEROBIC AND ANAEROBIC Blood Culture adequate volume   Culture   Final    NO GROWTH 2 DAYS Performed at [facility], [address] [place], [place] [address]    Report Status PENDING  Incomplete         Radiology Studies: DG Chest 1 View  Result Date: 12/20/2021 CLINICAL DATA:  Increasing shortness of breath. EXAM: CHEST  1 VIEW COMPARISON:  [place] chest yesterday at 9:33 a.m. FINDINGS: 4:54 a.m. There is interval partial clearance of patchy atelectasis or pneumonic infiltrate from the left lower lobe. Linear scarring or atelectasis in  the left mid lung continues to be seen. Remaining lungs generally clear. No pleural effusion is evident. There is aortic calcification and tortuosity with stable  mediastinum. There is mild cardiomegaly. Central vessels are normal caliber. There is thoracic spondylosis. IMPRESSION: Improved aeration in the left lower lobe. Patchy infiltrate or atelectasis remains but is less confluent. Electronically Signed   By: Telford Nab M.D.   On: 12/20/2021 05:31   DG Abd 1 View  Result Date: 12/19/2021 CLINICAL DATA:  Ileus EXAM: ABDOMEN - 1 VIEW COMPARISON:  CT 10/15/2021 FINDINGS: Gaseous distension of large and small bowel loops throughout the abdomen. No gross free intraperitoneal air. No radio-opaque calculi or other significant radiographic abnormality are seen. IMPRESSION: Gaseous distension of large and small bowel loops throughout the abdomen, most suggestive of ileus. Electronically Signed   By: Davina Poke D.O.   On: 12/19/2021 12:03   DG CHEST PORT 1 VIEW  Result Date: 12/19/2021 CLINICAL DATA:  Cough with shortness of breath and chest pain EXAM: PORTABLE CHEST 1 VIEW COMPARISON:  Chest x-ray dated December 16, 2021 FINDINGS: Cardiac and mediastinal contours are unchanged when accounting for AP technique and lung volumes. Low lung volumes with hypoventilatory changes. Mild left basilar opacity which is likely due to atelectasis. No large pleural effusion or pneumothorax. Numerous dilated gas-filled loops of bowel are seen in the partially visualized upper abdomen. IMPRESSION: 1. Low lung volumes with hypoventilatory changes. Mild left basilar opacity which is likely due to atelectasis. 2. Numerous dilated gas-filled loops of bowel seen in the partially visualized upper abdomen, recommend clinical correlation and consider dedicated abdominal radiograph for further evaluation. Electronically Signed   By: Yetta Glassman M.D.   On: 12/19/2021 09:44        Scheduled Meds:  benzonatate  100 mg Oral TID   bisacodyl  10 mg Rectal Once   budesonide (PULMICORT) nebulizer solution  0.5 mg Nebulization BID   dextromethorphan-guaiFENesin  1 tablet Oral BID    diclofenac Sodium  4 g Topical QID   diclofenac Sodium  4 g Topical QID   diltiazem  240 mg Oral Daily   enoxaparin (LOVENOX) injection  130 mg Subcutaneous Q12H   insulin aspart  0-5 Units Subcutaneous QHS   insulin aspart  0-6 Units Subcutaneous TID WC   insulin aspart  4 Units Subcutaneous TID WC   insulin glargine-yfgn  13 Units Subcutaneous Daily   metoCLOPramide (REGLAN) injection  5 mg Intravenous Q6H   metoprolol tartrate  5 mg Intravenous Q8H   pantoprazole (PROTONIX) IV  40 mg Intravenous Q24H   pregabalin  100 mg Oral BID   sodium chloride flush  3 mL Intravenous Q12H   sodium chloride flush  3 mL Intravenous Q12H   Continuous Infusions:  sodium chloride     ceFEPime (MAXIPIME) IV 2 g (12/21/21 0040)   doxycycline (VIBRAMYCIN) IV 100 mg (12/21/21 0134)   lactated ringers 50 mL/hr at 12/21/21 0807   potassium chloride       LOS: 5 days    Time spent: 35 minutes    Forestine Macho Darleen Crocker, DO [facility]  If 7PM-7AM, please contact night-coverage [email or web address] 12/21/2021, 9:14 AM

## 2021-12-21 NOTE — Plan of Care (Signed)
  Problem: Skin Integrity: Goal: Risk for impaired skin integrity will decrease Outcome: Progressing   Problem: Coping: Goal: Level of anxiety will decrease Outcome: Progressing   Problem: Activity: Goal: Risk for activity intolerance will decrease Outcome: Progressing

## 2021-12-21 NOTE — Plan of Care (Signed)
  Problem: Acute Rehab PT Goals(only PT should resolve) Goal: Pt will Roll Supine to Side Outcome: Progressing Flowsheets (Taken 12/21/2021 1229) Pt will Roll Supine to Side: with mod assist Goal: Pt Will Go Supine/Side To Sit Outcome: Progressing Flowsheets (Taken 12/21/2021 1229) Pt will go Supine/Side to Sit:  with moderate assist  with HOB elevated Goal: Pt Will Go Sit To Supine/Side Outcome: Progressing Flowsheets (Taken 12/21/2021 1229) Pt will go Sit to Supine/Side:  with moderate assist  with HOB  elevated Goal: Patient Will Perform Sitting Balance Outcome: Progressing Flowsheets (Taken 12/21/2021 1229) Patient will perform sitting balance:  3- 5 min  with bilateral UE support  with supervision Goal: Patient Will Transfer Sit To/From Stand Outcome: Progressing Flowsheets (Taken 12/21/2021 1229) Patient will transfer sit to/from stand:  from elevated surface  with moderate assist   Pamala Hurry D. Hartnett-Rands, MS, PT Per [place] [phone] 12/21/2021

## 2021-12-21 NOTE — Consult Note (Signed)
[name], M.D. Gastroenterology & Hepatology                                           Patient Name: Steve Jensen Account #: '@[id number]'$ @   MRN: [id number] Admission Date: 12/16/2021 Date of Evaluation:  12/21/2021 Time of Evaluation: 10:45 AM   Referring Physician: Heath Lark, DO  Chief Complaint: Ileus and abdominal distention  HPI:  This is a 81 y.o. male with history of diabetes, GERD, hyperlipidemia, hypertension, morbid obesity, OSA, neuropathy, prostate cancer, who was admitted to the hospital on 12/16/2021 after presenting worsening shortness of breath.  The patient was diagnosed with severe sepsis due to CAP/PNA for which he was started on IV antibiotics and oxygen supplementation.  Gastroenterology was consulted for evaluation of ileus.  Patient reports that he has felt better with the antibiotic management for his pneumonia.  He noticed that 3 days ago he was not able to move his bowels and his abdomen was getting more distended.  This possibly led to worsening shortness of breath as he felt his abdomen was pressing towards his chest.  He denied any nausea or vomiting and has been eating small amount of food.  He reports that he has not been moving from his bed for the last week and he feels significant weakness in his lower extremities.  Notably, he received a dose of oxycodone on 12/19/2021, he also received tramadol on 12/18/2021.  Upon review of his labs, the patient had a KUB on 12/19/2021 which showed numerous dilated gas-filled loops in the large and small bowel suggestive of ileus.  Labs today showed potassium of 3.3, magnesium of 2.4, calcium of 8.6 (corrected calcium 10.0), CBC with bowel cell count of 20,900, hemoglobin 8.4 and platelets 799.  Repeat KUB was ordered which I reviewed personally, shows persistent bowel dilation although not critical, similar to previous images from 2 days ago.  Today, the patient reports that in the morning he had a large explosive watery  bowel movement which was confirmed by the nursing staff.  He reported his abdominal distention improved after he was able to move his bowels and he does not have any pain in his abdomen at this moment.  He is still feeling distended.  Past Medical History: SEE CHRONIC ISSSUES: Past Medical History:  Diagnosis Date   Anemia    Diabetes mellitus type II, controlled ([place])    with neuropathy   Dysrhythmia    A-Fib. cardioversion done   GERD (gastroesophageal reflux disease)    Hyperlipidemia    Hypertension    Morbid obesity ([place])    Neuromuscular disorder ([place])    feet   OSA (obstructive sleep apnea)    On BiPAP at 15/11cm H2O   Prostate cancer ([place])    Shingles    on face Nov 2010   Urinary incontinence    Past Surgical History:  Past Surgical History:  Procedure Laterality Date   CARDIOVERSION N/A 09/06/2013   Procedure: CARDIOVERSION;  Surgeon: [name], MD;  Location: [facility] ENDOSCOPY;  Service: Cardiovascular;  Laterality: N/A;   EYE SURGERY     cataract surgery bilat; surgery to correct droopy eyelids    IR ANGIOGRAM SELECTIVE EACH ADDITIONAL VESSEL  02/15/2018   IR ANGIOGRAM SELECTIVE EACH ADDITIONAL VESSEL  02/15/2018   IR ANGIOGRAM SELECTIVE EACH ADDITIONAL VESSEL  02/15/2018   IR ANGIOGRAM SELECTIVE EACH ADDITIONAL  VESSEL  02/15/2018   IR ANGIOGRAM SELECTIVE EACH ADDITIONAL VESSEL  02/15/2018   IR ANGIOGRAM VISCERAL SELECTIVE  02/15/2018   IR ANGIOGRAM VISCERAL SELECTIVE  02/15/2018   IR EMBO TUMOR ORGAN ISCHEMIA INFARCT INC GUIDE ROADMAPPING  02/15/2018   IR RADIOLOGIST EVAL & MGMT  01/26/2018   IR RADIOLOGIST EVAL & MGMT  03/17/2018   IR RADIOLOGIST EVAL & MGMT  06/29/2018   IR RADIOLOGIST EVAL & MGMT  07/27/2018   IR RADIOLOGIST EVAL & MGMT  10/28/2018   IR RADIOLOGIST EVAL & MGMT  12/14/2018   IR RADIOLOGIST EVAL & MGMT  03/17/2019   IR RADIOLOGIST EVAL & MGMT  07/14/2019   IR RADIOLOGIST EVAL & MGMT  12/27/2019   IR RADIOLOGIST EVAL & MGMT  08/21/2020   IR RADIOLOGIST  EVAL & MGMT  02/28/2021   IR RADIOLOGIST EVAL & MGMT  06/25/2021   IR RADIOLOGIST EVAL & MGMT  10/18/2021   IR US GUIDE VASC ACCESS RIGHT  02/15/2018   knee surgery bilat      PROSTATE SURGERY     RADIOLOGY WITH ANESTHESIA N/A 11/24/2018   Procedure: MICROWAVE THERMAL ABLATION LIVER;  Surgeon: Sandi Mariscal, MD;  Location: [facility] ORS;  Service: Anesthesiology;  Laterality: N/A;   right shoulder rotator cuff surgery     TONSILLECTOMY     Family History:  Family History  Problem Relation Age of Onset   Hypertension Brother    Diabetes Brother    Cancer Brother        bladder cancer   Diabetes Brother    CVA Brother    Hypertension Brother    Prostate cancer Brother    Diabetes Brother    Hypertension Brother    Heart disease Brother        CABG   Heart attack Brother    Stroke Brother    Social History:  Social History   Tobacco Use   Smoking status: Former    Packs/day: 1.00    Years: 15.00    Total pack years: 15.00    Types: Cigarettes    Quit date: 05/27/1975    Years since quitting: 46.6   Smokeless tobacco: Never  Vaping Use   Vaping Use: Never used  Substance Use Topics   Alcohol use: No   Drug use: No    Home Medications:  Prior to Admission medications   Medication Sig Start Date End Date Taking? Authorizing Provider  allopurinol (ZYLOPRIM) 100 MG tablet Take 100 mg by mouth daily. 04/17/21  Yes [provider]  apixaban (ELIQUIS) 5 MG TABS tablet TAKE 1 TABLET(5 MG) BY MOUTH TWICE DAILY 11/15/18  Yes Turner, Traci R, MD  CINNAMON PO Take 1,000 mg by mouth daily.   Yes [provider]  diltiazem (CARDIZEM CD) 240 MG 24 hr capsule TAKE 1 CAPSULE BY MOUTH  DAILY 09/09/21  Yes [name], MD  fish oil-omega-3 fatty acids 1000 MG capsule Take 2 g by mouth 2 (two) times daily.    Yes [provider]  furosemide (LASIX) 40 MG tablet TAKE 1 TABLET BY MOUTH TWICE  DAILY AS NEEDED 11/04/21  Yes [name], MD  ibuprofen  (ADVIL,MOTRIN) 200 MG tablet Take 800 mg by mouth every 6 (six) hours as needed (PAIN). prn 06/30/13  Yes [provider]  loratadine (CLARITIN) 10 MG tablet Take 10 mg by mouth daily as needed for allergies.    Yes [provider]  metFORMIN (GLUCOPHAGE) 1000 MG tablet Take 1,000  mg by mouth 2 (two) times daily. 06/27/14  Yes [provider]  metoprolol succinate (TOPROL-XL) 50 MG 24 hr tablet Take 50 mg by mouth daily.  08/10/13  Yes [provider]  Multiple Vitamins-Minerals (MULTIVITAMIN WITH MINERALS) tablet Take 1 tablet by mouth daily.   Yes [provider]  omeprazole (PRILOSEC) 20 MG capsule Take 20 mg by mouth daily.  11/29/12  Yes [provider]  oxymetazoline (AFRIN) 0.05 % nasal spray Place 1 spray into both nostrils 2 (two) times daily as needed for congestion.   Yes [provider]  potassium chloride SA (KLOR-CON M) 20 MEQ tablet TAKE 1 TABLET BY MOUTH  DAILY AS NEEDED WHILE  TAKING FUROSEMIDE Patient taking differently: Take 20 mEq by mouth daily. 06/28/21  Yes [name], MD  pregabalin (LYRICA) 100 MG capsule Take 100 mg by mouth 2 (two) times a day.  09/14/18  Yes [provider]  ramipril (ALTACE) 10 MG capsule Take 10 mg by mouth 2 (two) times daily. 01/11/13  Yes [provider]  tirzepatide Darcel Bayley) 5 MG/0.5ML Pen Inject 5 mg into the skin once a week.   Yes [provider]  ZETIA 10 MG tablet Take 10 mg by mouth daily.  01/17/13  Yes [provider]    Inpatient Medications:  Current Facility-Administered Medications:    0.9 %  sodium chloride infusion, , Intravenous, PRN, Denton Brick, Courage, MD   acetaminophen (TYLENOL) tablet 650 mg, 650 mg, Oral, Q6H PRN, 650 mg at 12/19/21 0242 **OR** acetaminophen (TYLENOL) suppository 650 mg, 650 mg, Rectal, Q6H PRN, Emokpae, Courage, MD   albuterol (PROVENTIL) (2.5 MG/3ML) 0.083% nebulizer solution 2.5 mg, 2.5 mg, Nebulization, Q2H PRN,  Denton Brick, Courage, MD, 2.5 mg at 12/20/21 0429   ALPRAZolam (XANAX) tablet 0.25 mg, 0.25 mg, Oral, BID PRN, Manuella Ghazi, Pratik D, DO, 0.25 mg at 12/21/21 1017   benzonatate (TESSALON) capsule 100 mg, 100 mg, Oral, TID, Manuella Ghazi, Pratik D, DO, 100 mg at 12/21/21 2536   bisacodyl (DULCOLAX) suppository 10 mg, 10 mg, Rectal, Daily PRN, Denton Brick, Courage, MD   bisacodyl (DULCOLAX) suppository 10 mg, 10 mg, Rectal, Once, Manuella Ghazi, Pratik D, DO   budesonide (PULMICORT) nebulizer solution 0.5 mg, 0.5 mg, Nebulization, BID, Barton Dubois, MD, 0.5 mg at 12/21/21 6440   ceFEPIme (MAXIPIME) 2 g in sodium chloride 0.9 % 100 mL IVPB, 2 g, Intravenous, Q12H, Shah, Pratik D, DO, Last Rate: 200 mL/hr at 12/21/21 1002, 2 g at 12/21/21 1002   dextromethorphan-guaiFENesin (MUCINEX DM) 30-600 MG per 12 hr tablet 1 tablet, 1 tablet, Oral, BID, Emokpae, Courage, MD, 1 tablet at 12/21/21 0806   diclofenac Sodium (VOLTAREN) 1 % topical gel 4 g, 4 g, Topical, QID, Shah, Pratik D, DO, 4 g at 12/21/21 0811   diltiazem (CARDIZEM CD) 24 hr capsule 240 mg, 240 mg, Oral, Daily, Emokpae, Courage, MD, 240 mg at 12/21/21 0806   doxycycline (VIBRAMYCIN) 100 mg in sodium chloride 0.9 % 250 mL IVPB, 100 mg, Intravenous, Q12H, Shah, Pratik D, DO, Last Rate: 125 mL/hr at 12/21/21 0134, 100 mg at 12/21/21 0134   enoxaparin (LOVENOX) injection 130 mg, 130 mg, Subcutaneous, Q12H, Shah, Pratik D, DO, 130 mg at 12/21/21 1002   guaiFENesin-dextromethorphan (ROBITUSSIN DM) 100-10 MG/5ML syrup 5 mL, 5 mL, Oral, Q4H PRN, Emokpae, Courage, MD, 5 mL at 12/21/21 0604   insulin aspart (novoLOG) injection 0-5 Units, 0-5 Units, Subcutaneous, QHS, Emokpae, Courage, MD, 2 Units at 12/20/21 2240   insulin aspart (novoLOG) injection  0-6 Units, 0-6 Units, Subcutaneous, TID WC, Emokpae, Courage, MD, 1 Units at 12/21/21 0805   insulin aspart (novoLOG) injection 4 Units, 4 Units, Subcutaneous, TID WC, Barton Dubois, MD, 4 Units at 12/21/21 0805   insulin glargine-yfgn  (SEMGLEE) injection 13 Units, 13 Units, Subcutaneous, Daily, Manuella Ghazi, Pratik D, DO, 13 Units at 12/21/21 1005   ipratropium-albuterol (DUONEB) 0.5-2.5 (3) MG/3ML nebulizer solution 3 mL, 3 mL, Nebulization, Q6H PRN, Manuella Ghazi, Pratik D, DO, 3 mL at 12/21/21 3557   lactated ringers infusion, , Intravenous, Continuous, Manuella Ghazi, Pratik D, DO, Paused at 12/21/21 1007   metoCLOPramide (REGLAN) injection 5 mg, 5 mg, Intravenous, Q6H, Rigoberto Noel, MD, 5 mg at 12/21/21 0559   metoprolol tartrate (LOPRESSOR) injection 5 mg, 5 mg, Intravenous, Q8H, Shah, Pratik D, DO, 5 mg at 12/21/21 0559   ondansetron (ZOFRAN) tablet 4 mg, 4 mg, Oral, Q6H PRN **OR** ondansetron (ZOFRAN) injection 4 mg, 4 mg, Intravenous, Q6H PRN, Emokpae, Courage, MD   pantoprazole (PROTONIX) injection 40 mg, 40 mg, Intravenous, Q24H, Shah, Pratik D, DO, 40 mg at 12/21/21 0805   polyethylene glycol (MIRALAX / GLYCOLAX) packet 17 g, 17 g, Oral, Daily PRN, Emokpae, Courage, MD   potassium chloride 10 mEq in 100 mL IVPB, 10 mEq, Intravenous, Q1 Hr x 4, Shah, Pratik D, DO   pregabalin (LYRICA) capsule 100 mg, 100 mg, Oral, BID, Emokpae, Courage, MD, 100 mg at 12/21/21 0806   sodium chloride flush (NS) 0.9 % injection 3 mL, 3 mL, Intravenous, Q12H, Emokpae, Courage, MD, 3 mL at 12/19/21 2240   sodium chloride flush (NS) 0.9 % injection 3 mL, 3 mL, Intravenous, Q12H, Emokpae, Courage, MD, 3 mL at 12/21/21 0809   sodium chloride flush (NS) 0.9 % injection 3 mL, 3 mL, Intravenous, PRN, Emokpae, Courage, MD   traMADol (ULTRAM) tablet 50 mg, 50 mg, Oral, Q8H PRN, Barton Dubois, MD, 50 mg at 12/18/21 0150   traZODone (DESYREL) tablet 50 mg, 50 mg, Oral, QHS PRN, Emokpae, Courage, MD, 50 mg at 12/19/21 2119 Allergies: Lipitor [atorvastatin], Simvastatin, Zithromax [azithromycin], and Amlodipine  Complete Review of Systems: GENERAL: negative for malaise, night sweats HEENT: No changes in hearing or vision, no nose bleeds or other nasal problems. NECK:  Negative for lumps, goiter, pain and significant neck swelling RESPIRATORY: Negative for cough, wheezing CARDIOVASCULAR: Negative for chest pain, leg swelling, palpitations, orthopnea GI: SEE HPI MUSCULOSKELETAL: Negative for joint pain or swelling, back pain, and muscle pain. SKIN: Negative for lesions, rash PSYCH: Negative for sleep disturbance, mood disorder and recent psychosocial stressors. HEMATOLOGY Negative for prolonged bleeding, bruising easily, and swollen nodes. ENDOCRINE: Negative for cold or heat intolerance, polyuria, polydipsia and goiter. NEURO: negative for tremor, gait imbalance, syncope and seizures. The remainder of the review of systems is noncontributory.  Physical Exam: BP 138/62 (BP Location: Right Arm)   Pulse 79   Temp 98.2 F (36.8 C) (Oral)   Resp (!) 28   Ht '5\' 10"'$  (1.778 m)   Wt 131.9 kg   SpO2 91%   BMI 41.72 kg/m  GENERAL: The patient is AO x3, in no acute distress. Using CPAP. HEENT: Head is normocephalic and atraumatic. EOMI are intact. Mouth is well hydrated and without lesions. NECK: Supple. No masses LUNGS: Decreased breath sounds bilaterally HEART: RRR, normal s1 and s2. ABDOMEN: Soft, nontender, no guarding, no peritoneal signs,.  Patient is distended but not tense, tympanic to percussion. BS +. No masses. EXTREMITIES: Without any cyanosis, clubbing, rash, lesions or edema. NEUROLOGIC: AOx3,  no focal motor deficit. SKIN: no jaundice, no rashes  Laboratory Data CBC:     Component Value Date/Time   WBC 20.9 (H) 12/21/2021 0526   RBC 4.21 (L) 12/21/2021 0526   HGB 8.4 (L) 12/21/2021 0526   HGB 11.4 (L) 08/21/2021 1544   HCT 28.6 (L) 12/21/2021 0526   HCT 35.4 (L) 08/21/2021 1544   PLT 799 (H) 12/21/2021 0526   PLT 408 08/21/2021 1544   MCV 67.9 (L) 12/21/2021 0526   MCV 71 (L) 08/21/2021 1544   MCH 20.0 (L) 12/21/2021 0526   MCHC 29.4 (L) 12/21/2021 0526   RDW 19.7 (H) 12/21/2021 0526   RDW 17.6 (H) 08/21/2021 1544   LYMPHSABS  2.3 12/16/2021 1816   MONOABS 2.4 (H) 12/16/2021 1816   EOSABS 0.1 12/16/2021 1816   BASOSABS 0.1 12/16/2021 1816   COAG:  Lab Results  Component Value Date   INR 1.0 12/09/2019   INR 1.1 07/05/2019   INR 1.1 11/19/2018    BMP:     Latest Ref Rng & Units 12/21/2021    5:26 AM 12/20/2021    5:18 AM 12/19/2021    5:36 AM  BMP  Glucose 70 - 99 mg/dL 185  214  215   BUN 8 - 23 mg/dL 57  46  32   Creatinine 0.61 - 1.24 mg/dL 1.52  1.44  1.59   Sodium 135 - 145 mmol/L 137  137  138   Potassium 3.5 - 5.1 mmol/L 3.3  3.4  3.8   Chloride 98 - 111 mmol/L 108  104  107   CO2 22 - 32 mmol/L '20  20  20   '$ Calcium 8.9 - 10.3 mg/dL 8.6  8.8  8.4     HEPATIC:     Latest Ref Rng & Units 10/09/2021    5:32 PM 06/11/2021    4:35 PM 02/14/2021    2:41 PM  Hepatic Function  Total Protein 6.1 - 8.1 g/dL 6.3  6.6  6.7   AST 10 - 35 U/L '16  30  19   '$ ALT 9 - 46 U/L 23  34  22   Total Bilirubin 0.2 - 1.2 mg/dL 0.4  0.5  0.5     CARDIAC: No results found for: "CKTOTAL", "CKMB", "CKMBINDEX", "TROPONINI"   Imaging: I personally reviewed and interpreted the available imaging.  Assessment & Plan: ANTONIN MEININGER is a 81 y.o. male with history of diabetes, GERD, hyperlipidemia, hypertension, morbid obesity, OSA, neuropathy, prostate cancer, who was admitted to the hospital on 12/16/2021 after presenting worsening shortness of breath.  The patient was diagnosed with severe sepsis due to CAP/PNA for which he was started on IV antibiotics and oxygen supplementation.  Gastroenterology was consulted for evaluation of ileus.  Patient presented transient ileus in the setting of severe sepsis.  Had some recent narcotic exposure x2 which may have aggravated his current condition.  However, he was able to have a large bowel movement today in the morning which has provided improvement of his symptoms.  We discussed thoroughly the risks of proceeding with endoscopic decompression as requested by the primary team, which  include a high risk of bowel perforation and bleeding.  I also informed to the patient that this is a temporizing measure which does not directly resolve the ileus.  The patient agreed to proceed with more conservative measures such as frequent mobilization, repletion of electrolytes and laxatives.  - Frequent abdominal exams - Nursing staff informed to move the patient  from one side to the other every hour if not able to ambulate -Physical therapy evaluation to determine if patient can start mobilizing -Start MiraLAX twice daily -Correction of electrolytes, keep potassium more than 4.0, corrected calcium should be more than 9 -Minimize use of opiates.  Harvel Quale, MD Gastroenterology and Hepatology [facility]

## 2021-12-21 NOTE — Evaluation (Signed)
Physical Therapy Evaluation Patient Details Name: Steve Jensen MRN: [id number] DOB: January 29, 1941 Today's Date: 12/21/2021  History of Present Illness  Steve Jensen  is a 81 y.o. male reformed smoker with past medical history relevant for morbid obesity/OSA, chronic A-fib status post prior cardioversion chronically on Eliquis for stroke prophylaxis, HTN, chronic diastolic dysfunction CHF and advanced osteoarthritis as well as diabetes mellitus who presents to the ED with complaints of productive cough with colored sputum, shortness of breath and dyspnea on exertion since 12/13/2021.  Patient was admitted for severe sepsis secondary to community-acquired pneumonia, present on admission.  He is also noted to have associated acute hypoxemic respiratory failure.  He now has ileus for which rectal tube has been placed.  Pulmonology consulted due to ongoing hypoxemia and poor progression.   Clinical Impression  Patient supine in bed with respiratory therapist in room upon therapist arrival. Patient agreeable to participating in PT evaluation today. Patient quite limited in his mobility from pain in knees and fatigue demonstrating significant weakness, decreased endurance for activity, and dyspnea on exertion. Patient require mod to [place] assist with all mobility.  Patient would continue to benefit from skilled physical therapy in current environment and next venue to continue return to prior function and increase strength, endurance, balance, coordination, and functional mobility and gait skills.         Recommendations for follow up therapy are one component of a multi-disciplinary discharge planning process, led by the attending physician.  Recommendations may be updated based on patient status, additional functional criteria and insurance authorization.  Follow Up Recommendations Skilled nursing-short term rehab (<3 hours/day) Can patient physically be transported by private vehicle: No    Assistance  Recommended at Discharge Intermittent Supervision/Assistance  Patient can return home with the following  Two people to help with walking and/or transfers;Two people to help with bathing/dressing/bathroom;Assistance with cooking/housework;Assist for transportation;Help with stairs or ramp for entrance    Equipment Recommendations None recommended by PT  Recommendations for Other Services       Functional Status Assessment Patient has had a recent decline in their functional status and demonstrates the ability to make significant improvements in function in a reasonable and predictable amount of time.     Precautions / Restrictions Precautions Precautions: Fall Restrictions Weight Bearing Restrictions: No      Mobility  Bed Mobility Overal bed mobility: Needs Assistance Bed Mobility: Supine to Sit, Sit to Supine     Supine to sit: Mod assist Sit to supine: Mod assist   General bed mobility comments: assist for lower extremities; mod assist to boost up in bed in head lowered position with patient using UE's to assist    Transfers Overall transfer level: Needs assistance Equipment used: Rolling walker (2 wheels) Transfers: Sit to/from Stand, Bed to chair/wheelchair/BSC Sit to Stand: [place] assist          Lateral/Scoot Transfers: [place] assist General transfer comment: unable to power up to a complete standing position safely; will require an elevated surface    Ambulation/Gait   General Gait Details: not attempted  Stairs    Wheelchair Mobility    Modified Rankin (Stroke Patients Only)       Balance Overall balance assessment: Needs assistance Sitting-balance support: Bilateral upper extremity supported, Feet supported Sitting balance-Leahy Scale: Fair Sitting balance - Comments: fair seated at EOB         Pertinent Vitals/Pain Pain Assessment Pain Assessment: 0-10 Pain Score: 0-No pain Pain Location: at rest; knee pain with  movement Pain  Intervention(s): Limited activity within patient's tolerance, Monitored during session, Repositioned    Home Living Family/patient expects to be discharged to:: Skilled nursing facility     Prior Function       Hand Dominance        Extremity/Trunk Assessment   Upper Extremity Assessment Upper Extremity Assessment: Defer to OT evaluation    Lower Extremity Assessment Lower Extremity Assessment: Generalized weakness    Cervical / Trunk Assessment Cervical / Trunk Assessment: Normal  Communication      Cognition Arousal/Alertness: Awake/alert Behavior During Therapy: WFL for tasks assessed/performed Overall Cognitive Status: Within Functional Limits for tasks assessed          General Comments      Exercises     Assessment/Plan    PT Assessment Patient needs continued PT services  PT Problem List Decreased strength;Decreased mobility;Decreased activity tolerance;Decreased balance;Pain;Obesity       PT Treatment Interventions DME instruction;Therapeutic exercise;Gait training;Balance training;Neuromuscular re-education;Functional mobility training;Therapeutic activities;Patient/family education    PT Goals (Current goals can be found in the Care Plan section)  Acute Rehab PT Goals Patient Stated Goal: Get his knee replacement surgery done. PT Goal Formulation: With patient Time For Goal Achievement: 01/04/22 Potential to Achieve Goals: Fair    Frequency Min 3X/week        AM-PAC PT "6 Clicks" Mobility  Outcome Measure Help needed turning from your back to your side while in a flat bed without using bedrails?: A Lot Help needed moving from lying on your back to sitting on the side of a flat bed without using bedrails?: A Lot Help needed moving to and from a bed to a chair (including a wheelchair)?: Total Help needed standing up from a chair using your arms (e.g., wheelchair or bedside chair)?: Total Help needed to walk in hospital room?: Total Help  needed climbing 3-5 steps with a railing? : Total 6 Click Score: 8    End of Session Equipment Utilized During Treatment: Gait belt Activity Tolerance: Patient limited by fatigue;Patient limited by pain Patient left: in bed;with call bell/phone within reach;with bed alarm set;Other (comment) (respiratory present) Nurse Communication: Mobility status PT Visit Diagnosis: Unsteadiness on feet (R26.81);Other abnormalities of gait and mobility (R26.89);Muscle weakness (generalized) (M62.81)    Time: 2297-9892 PT Time Calculation (min) (ACUTE ONLY): 33 min   Charges:   PT Evaluation $PT Eval Low Complexity: 1 Low PT Treatments $Therapeutic Activity: 8-22 mins       Floria Raveling. Hartnett-Rands, MS, PT Per [place] [phone]  Pamala Hurry  Hartnett-Rands 12/21/2021, 12:28 PM

## 2021-12-21 NOTE — Progress Notes (Signed)
[place] for lovenox Indication: atrial fibrillation  Allergies  Allergen Reactions   Lipitor [Atorvastatin] Other (See Comments)    Muscle aches   Simvastatin Other (See Comments)    Muscle aches   Zithromax [Azithromycin] Other (See Comments)    GI-SIDE EFFECTS   Amlodipine Other (See Comments)    Patient Measurements: Height: '5\' 10"'$  (177.8 cm) Weight: 131.9 kg (290 lb 12.6 oz) IBW/kg (Calculated) : 73 Heparin Dosing Weight:   Vital Signs: Temp: 98.2 F (36.8 C) (07/29 0556) Temp Source: Oral (07/29 0556) BP: 138/62 (07/29 0556) Pulse Rate: 79 (07/29 0556)  Labs: Recent Labs    12/19/21 0536 12/20/21 0518 12/21/21 0526  HGB 8.4* 9.2* 8.4*  HCT 29.8* 31.1* 28.6*  PLT 537* 873* 799*  CREATININE 1.59* 1.44* 1.52*     Estimated Creatinine Clearance: 53.8 mL/min (A) (by C-G formula based on SCr of 1.52 mg/dL (H)).   Medical History: Past Medical History:  Diagnosis Date   Anemia    Diabetes mellitus type II, controlled ([place])    with neuropathy   Dysrhythmia    A-Fib. cardioversion done   GERD (gastroesophageal reflux disease)    Hyperlipidemia    Hypertension    Morbid obesity ([place])    Neuromuscular disorder ([place])    feet   OSA (obstructive sleep apnea)    On BiPAP at 15/11cm H2O   Prostate cancer ([place])    Shingles    on face Nov 2010   Urinary incontinence     Medications:  Medications Prior to Admission  Medication Sig Dispense Refill Last Dose   allopurinol (ZYLOPRIM) 100 MG tablet Take 100 mg by mouth daily.   12/15/2021   apixaban (ELIQUIS) 5 MG TABS tablet TAKE 1 TABLET(5 MG) BY MOUTH TWICE DAILY 180 tablet 1 12/15/2021 at 1030   CINNAMON PO Take 1,000 mg by mouth daily.   12/15/2021   diltiazem (CARDIZEM CD) 240 MG 24 hr capsule TAKE 1 CAPSULE BY MOUTH  DAILY 90 capsule 3 12/15/2021   fish oil-omega-3 fatty acids 1000 MG capsule Take 2 g by mouth 2 (two) times daily.    12/15/2021   furosemide  (LASIX) 40 MG tablet TAKE 1 TABLET BY MOUTH TWICE  DAILY AS NEEDED 180 tablet 2 12/15/2021   ibuprofen (ADVIL,MOTRIN) 200 MG tablet Take 800 mg by mouth every 6 (six) hours as needed (PAIN). prn   12/15/2021   loratadine (CLARITIN) 10 MG tablet Take 10 mg by mouth daily as needed for allergies.    12/15/2021   metFORMIN (GLUCOPHAGE) 1000 MG tablet Take 1,000 mg by mouth 2 (two) times daily.  0 12/15/2021   metoprolol succinate (TOPROL-XL) 50 MG 24 hr tablet Take 50 mg by mouth daily.    12/15/2021 at 1930   Multiple Vitamins-Minerals (MULTIVITAMIN WITH MINERALS) tablet Take 1 tablet by mouth daily.   12/15/2021   omeprazole (PRILOSEC) 20 MG capsule Take 20 mg by mouth daily.    12/15/2021   oxymetazoline (AFRIN) 0.05 % nasal spray Place 1 spray into both nostrils 2 (two) times daily as needed for congestion.   12/15/2021   potassium chloride SA (KLOR-CON M) 20 MEQ tablet TAKE 1 TABLET BY MOUTH  DAILY AS NEEDED WHILE  TAKING FUROSEMIDE (Patient taking differently: Take 20 mEq by mouth daily.) 90 tablet 3 12/15/2021   pregabalin (LYRICA) 100 MG capsule Take 100 mg by mouth 2 (two) times a day.    12/15/2021   ramipril (ALTACE) 10 MG  capsule Take 10 mg by mouth 2 (two) times daily.   12/15/2021   tirzepatide (MOUNJARO) 5 MG/0.5ML Pen Inject 5 mg into the skin once a week.   Past Week   ZETIA 10 MG tablet Take 10 mg by mouth daily.    12/15/2021    Assessment: Pharmacy consulted to dose lovenox in patient with atrial fibrillation.  Patient is on apixaban prior to admission with last dose given 7/27 @ 0948.  Hgb 8.4  Goal of Therapy:   Monitor platelets by anticoagulation protocol: Yes   Plan:  Lovenox 130 mg subq every 12 hours (starting at 2200) Monitor labs and s/s of bleeding.  Thomasenia Sales, PharmD, [facility] Clinical Pharmacist  12/21/2021 7:54 AM

## 2021-12-22 DIAGNOSIS — J189 Pneumonia, unspecified organism: Secondary | ICD-10-CM | POA: Diagnosis not present

## 2021-12-22 DIAGNOSIS — K567 Ileus, unspecified: Secondary | ICD-10-CM | POA: Diagnosis not present

## 2021-12-22 LAB — CBC
HCT: 25.8 % — ABNORMAL LOW (ref 39.0–52.0)
Hemoglobin: 7.7 g/dL — ABNORMAL LOW (ref 13.0–17.0)
MCH: 20.3 pg — ABNORMAL LOW (ref 26.0–34.0)
MCHC: 29.8 g/dL — ABNORMAL LOW (ref 30.0–36.0)
MCV: 67.9 fL — ABNORMAL LOW (ref 80.0–100.0)
Platelets: 755 10*3/uL — ABNORMAL HIGH (ref 150–400)
RBC: 3.8 MIL/uL — ABNORMAL LOW (ref 4.22–5.81)
RDW: 19.5 % — ABNORMAL HIGH (ref 11.5–15.5)
WBC: 16.9 10*3/uL — ABNORMAL HIGH (ref 4.0–10.5)
nRBC: 0.2 % (ref 0.0–0.2)

## 2021-12-22 LAB — BASIC METABOLIC PANEL
Anion gap: 6 (ref 5–15)
BUN: 41 mg/dL — ABNORMAL HIGH (ref 8–23)
CO2: 21 mmol/L — ABNORMAL LOW (ref 22–32)
Calcium: 8.2 mg/dL — ABNORMAL LOW (ref 8.9–10.3)
Chloride: 111 mmol/L (ref 98–111)
Creatinine, Ser: 1.19 mg/dL (ref 0.61–1.24)
GFR, Estimated: >60 mL/min (ref >60)
Glucose, Bld: 156 mg/dL — ABNORMAL HIGH (ref 70–99)
Potassium: 3.3 mmol/L — ABNORMAL LOW (ref 3.5–5.1)
Sodium: 138 mmol/L (ref 135–145)

## 2021-12-22 LAB — MAGNESIUM: Magnesium: 2.2 mg/dL (ref 1.7–2.4)

## 2021-12-22 LAB — GLUCOSE, CAPILLARY
Glucose-Capillary: 173 mg/dL — ABNORMAL HIGH (ref 70–99)
Glucose-Capillary: 252 mg/dL — ABNORMAL HIGH (ref 70–99)
Glucose-Capillary: 172 mg/dL — ABNORMAL HIGH (ref 70–99)
Glucose-Capillary: 177 mg/dL — ABNORMAL HIGH (ref 70–99)

## 2021-12-22 MED ORDER — SODIUM CHLORIDE 0.9 % IV SOLN
2.0000 g | Freq: Three times a day (TID) | INTRAVENOUS | Status: AC
  Administered 2021-12-22 (×2): 2 g via INTRAVENOUS
  Filled 2021-12-22 (×2): qty 12.5

## 2021-12-22 MED ORDER — SODIUM CHLORIDE 0.9 % IV SOLN
100.0000 mg | Freq: Two times a day (BID) | INTRAVENOUS | Status: AC
  Administered 2021-12-22 (×2): 100 mg via INTRAVENOUS
  Filled 2021-12-22 (×2): qty 100

## 2021-12-22 MED ORDER — POTASSIUM CHLORIDE 10 MEQ/100ML IV SOLN
10.0000 meq | INTRAVENOUS | Status: AC
  Administered 2021-12-22 (×6): 10 meq via INTRAVENOUS
  Filled 2021-12-22 (×6): qty 100

## 2021-12-22 MED ORDER — SODIUM CHLORIDE 0.9 % IV SOLN: 2.0000 g | Freq: Three times a day (TID) | INTRAVENOUS | Status: DC

## 2021-12-22 NOTE — Progress Notes (Signed)
Steve Jensen, M.D. Gastroenterology & Hepatology   Interval History:  Patient reports that he has presented persistent distention of his abdomen, believes he is more distended today compared to yesterday.  He does not know if he has had some output from his rectal tube.  Unfortunately, I/O recordings  have not been accurate. He is currently on nasal cannula. KUB performed yesterday showed dilated loops of his bowels similar in size compared to prior x-rays. Per nursing staff, patient has not been mobilizing too much.  Inpatient Medications:  Current Facility-Administered Medications:    0.9 %  sodium chloride infusion, , Intravenous, PRN, Denton Brick, Courage, MD   acetaminophen (TYLENOL) tablet 650 mg, 650 mg, Oral, Q6H PRN, 650 mg at 12/19/21 0242 **OR** acetaminophen (TYLENOL) suppository 650 mg, 650 mg, Rectal, Q6H PRN, Emokpae, Courage, MD   albuterol (PROVENTIL) (2.5 MG/3ML) 0.083% nebulizer solution 2.5 mg, 2.5 mg, Nebulization, Q2H PRN, Denton Brick, Courage, MD, 2.5 mg at 12/20/21 0429   ALPRAZolam (XANAX) tablet 0.25 mg, 0.25 mg, Oral, BID PRN, Manuella Ghazi, Pratik D, DO, 0.25 mg at 12/21/21 1017   benzonatate (TESSALON) capsule 100 mg, 100 mg, Oral, TID, Manuella Ghazi, Pratik D, DO, 100 mg at 12/22/21 4098   bisacodyl (DULCOLAX) suppository 10 mg, 10 mg, Rectal, Daily PRN, Denton Brick, Courage, MD   bisacodyl (DULCOLAX) suppository 10 mg, 10 mg, Rectal, Once, Manuella Ghazi, Pratik D, DO   budesonide (PULMICORT) nebulizer solution 0.5 mg, 0.5 mg, Nebulization, BID, Barton Dubois, MD, 0.5 mg at 12/21/21 1940   ceFEPIme (MAXIPIME) 2 g in sodium chloride 0.9 % 100 mL IVPB, 2 g, Intravenous, Q8H, Coffee, Donna Christen, RPH   dextromethorphan-guaiFENesin (MUCINEX DM) 30-600 MG per 12 hr tablet 1 tablet, 1 tablet, Oral, BID, Emokpae, Courage, MD, 1 tablet at 12/22/21 1191   diclofenac Sodium (VOLTAREN) 1 % topical gel 4 g, 4 g, Topical, QID, Shah, Pratik D, DO, 4 g at 12/22/21 0820   diltiazem (CARDIZEM CD) 24 hr capsule 240  mg, 240 mg, Oral, Daily, Emokpae, Courage, MD, 240 mg at 12/22/21 0820   doxycycline (VIBRAMYCIN) 100 mg in sodium chloride 0.9 % 250 mL IVPB, 100 mg, Intravenous, Q12H, Shah, Pratik D, DO, Stopped at 12/22/21 0156   enoxaparin (LOVENOX) injection 130 mg, 130 mg, Subcutaneous, Q12H, Manuella Ghazi, Pratik D, DO, 130 mg at 12/22/21 4782   guaiFENesin-dextromethorphan (ROBITUSSIN DM) 100-10 MG/5ML syrup 5 mL, 5 mL, Oral, Q4H PRN, Emokpae, Courage, MD, 5 mL at 12/22/21 0158   insulin aspart (novoLOG) injection 0-5 Units, 0-5 Units, Subcutaneous, QHS, Emokpae, Courage, MD, 2 Units at 12/20/21 2240   insulin aspart (novoLOG) injection 0-6 Units, 0-6 Units, Subcutaneous, TID WC, Emokpae, Courage, MD, 1 Units at 12/22/21 0821   insulin aspart (novoLOG) injection 4 Units, 4 Units, Subcutaneous, TID WC, Barton Dubois, MD, 4 Units at 12/22/21 0820   insulin glargine-yfgn (SEMGLEE) injection 13 Units, 13 Units, Subcutaneous, Daily, Manuella Ghazi, Pratik D, DO, 13 Units at 12/22/21 9562   ipratropium-albuterol (DUONEB) 0.5-2.5 (3) MG/3ML nebulizer solution 3 mL, 3 mL, Nebulization, Q6H PRN, Manuella Ghazi, Pratik D, DO, 3 mL at 12/21/21 1940   lactated ringers infusion, , Intravenous, Continuous, Manuella Ghazi, Pratik D, DO, Paused at 12/21/21 1007   metoprolol succinate (TOPROL-XL) 24 hr tablet 50 mg, 50 mg, Oral, Daily, Manuella Ghazi, Pratik D, DO, 50 mg at 12/22/21 0819   ondansetron (ZOFRAN) tablet 4 mg, 4 mg, Oral, Q6H PRN **OR** ondansetron (ZOFRAN) injection 4 mg, 4 mg, Intravenous, Q6H PRN, Emokpae, Courage, MD   Oral care mouth rinse, 15 mL, Mouth Rinse,  PRN, Manuella Ghazi, Pratik D, DO   pantoprazole (PROTONIX) injection 40 mg, 40 mg, Intravenous, Q24H, Shah, Pratik D, DO, 40 mg at 12/22/21 0818   polyethylene glycol (MIRALAX / GLYCOLAX) packet 17 g, 17 g, Oral, BID, Manuella Ghazi, Pratik D, DO, 17 g at 12/22/21 0818   potassium chloride 10 mEq in 100 mL IVPB, 10 mEq, Intravenous, Q1 Hr x 6, Shah, Pratik D, DO, Last Rate: 100 mL/hr at 12/22/21 0814, 10 mEq at  12/22/21 0814   pregabalin (LYRICA) capsule 100 mg, 100 mg, Oral, BID, Emokpae, Courage, MD, 100 mg at 12/22/21 0820   sodium chloride flush (NS) 0.9 % injection 3 mL, 3 mL, Intravenous, Q12H, Emokpae, Courage, MD, 3 mL at 12/19/21 2240   sodium chloride flush (NS) 0.9 % injection 3 mL, 3 mL, Intravenous, Q12H, Emokpae, Courage, MD, 3 mL at 12/22/21 0820   sodium chloride flush (NS) 0.9 % injection 3 mL, 3 mL, Intravenous, PRN, Denton Brick, Courage, MD   traMADol (ULTRAM) tablet 50 mg, 50 mg, Oral, Q8H PRN, Barton Dubois, MD, 50 mg at 12/18/21 0150   traZODone (DESYREL) tablet 50 mg, 50 mg, Oral, QHS PRN, Denton Brick, Courage, MD, 50 mg at 12/21/21 2111   I/O    Intake/Output Summary (Last 24 hours) at 12/22/2021 0916 Last data filed at 12/22/2021 0900 Gross per 24 hour  Intake 2282.64 ml  Output 1550 ml  Net 732.64 ml     Physical Exam: Temp:  [98.4 F (36.9 C)-99 F (37.2 C)] 99 F (37.2 C) (07/30 0409) Pulse Rate:  [77-83] 81 (07/30 0409) Resp:  [18] 18 (07/30 0409) BP: (118-138)/(57-74) 118/57 (07/30 0409) SpO2:  [90 %-97 %] 96 % (07/30 0409)  Temp (24hrs), Avg:98.7 F (37.1 C), Min:98.4 F (36.9 C), Zarian:99 F (37.2 C) GENERAL: The patient is AO x3, in no acute distress. Obese. On . HEENT: Head is normocephalic and atraumatic. EOMI are intact. Mouth is well hydrated and without lesions. NECK: Supple. No masses LUNGS: Decreased breath sounds bilaterally HEART: RRR, normal s1 and s2. ABDOMEN: Soft, nontender, no guarding, no peritoneal signs,.  Patient is distended and midlly tense, tympanic to percussion. BS +. No masses. EXTREMITIES: Without any cyanosis, clubbing, rash, lesions or edema. NEUROLOGIC: AOx3, no focal motor deficit. SKIN: no jaundice, no rashes  Laboratory Data: CBC:     Component Value Date/Time   WBC 16.9 (H) 12/22/2021 0550   RBC 3.80 (L) 12/22/2021 0550   HGB 7.7 (L) 12/22/2021 0550   HGB 11.4 (L) 08/21/2021 1544   HCT 25.8 (L) 12/22/2021 0550   HCT  35.4 (L) 08/21/2021 1544   PLT 755 (H) 12/22/2021 0550   PLT 408 08/21/2021 1544   MCV 67.9 (L) 12/22/2021 0550   MCV 71 (L) 08/21/2021 1544   MCH 20.3 (L) 12/22/2021 0550   MCHC 29.8 (L) 12/22/2021 0550   RDW 19.5 (H) 12/22/2021 0550   RDW 17.6 (H) 08/21/2021 1544   LYMPHSABS 2.3 12/16/2021 1816   MONOABS 2.4 (H) 12/16/2021 1816   EOSABS 0.1 12/16/2021 1816   BASOSABS 0.1 12/16/2021 1816   COAG:  Lab Results  Component Value Date   INR 1.0 12/09/2019   INR 1.1 07/05/2019   INR 1.1 11/19/2018    BMP:     Latest Ref Rng & Units 12/22/2021    5:50 AM 12/21/2021    5:26 AM 12/20/2021    5:18 AM  BMP  Glucose 70 - 99 mg/dL 156  185  214   BUN 8 - 23 mg/dL  41  57  46   Creatinine 0.61 - 1.24 mg/dL 1.19  1.52  1.44   Sodium 135 - 145 mmol/L 138  137  137   Potassium 3.5 - 5.1 mmol/L 3.3  3.3  3.4   Chloride 98 - 111 mmol/L 111  108  104   CO2 22 - 32 mmol/L '21  20  20   '$ Calcium 8.9 - 10.3 mg/dL 8.2  8.6  8.8     HEPATIC:     Latest Ref Rng & Units 12/21/2021    5:43 AM 10/09/2021    5:32 PM 06/11/2021    4:35 PM  Hepatic Function  Total Protein 6.1 - 8.1 g/dL  6.3  6.6   Albumin 3.5 - 5.0 g/dL 2.3     AST 10 - 35 U/L  16  30   ALT 9 - 46 U/L  23  34   Total Bilirubin 0.2 - 1.2 mg/dL  0.4  0.5     CARDIAC: No results found for: "CKTOTAL", "CKMB", "CKMBINDEX", "TROPONINI"    Imaging: I personally reviewed and interpreted the available labs, imaging and endoscopic files.   Assessment/Plan: Byrne KNOX CERVI is a 81 y.o. male with history of diabetes, GERD, hyperlipidemia, hypertension, morbid obesity, OSA, neuropathy, prostate cancer, who was admitted to the hospital on 12/16/2021 after presenting worsening shortness of breath.  The patient was diagnosed with severe sepsis due to CAP/PNA for which he was started on IV antibiotics and oxygen supplementation.  Gastroenterology was consulted for evaluation of ileus.   Patient presented new onset ileus in the setting of severe  sepsis.  Had some recent narcotic exposure x2 which may have aggravated his current condition.  However, he was able to have a large bowel movement yesterday in the morning which initially provided improvement of his symptoms.  Today he is more distended and it is not clear if he had some significant output through his rectal tube.  We discussed thoroughly the risks of proceeding with endoscopic decompression as requested by the primary team, which include a high risk of bowel perforation and bleeding.  I also informed to the patient that this is a temporizing measure which does not directly resolve the ileus.  The patient agreed to proceed with more conservative measures such as frequent mobilization, repletion of electrolytes and laxatives for now.  We will repeat a KUB tomorrow and keep him n.p.o. in case his bowels get more distended, may need to have a temporizing decompression if worsening clinical status.   - Frequent abdominal exams -Spoke to the nursing staff about the importance of strict measurements of stool output through rectal tube - Nursing staff informed to move the patient from one side to the other every hour if not able to ambulate -Physical therapy evaluation -Continue MiraLAX twice daily -Correction of electrolytes, keep potassium more than 4.0, corrected calcium should be more than 9 -Minimize use of opiates. -KUB in the morning -N.p.o. after midnight  Steve Peppers, MD Gastroenterology and Hepatology [facility]

## 2021-12-22 NOTE — Progress Notes (Signed)
PROGRESS NOTE    Steve Jensen  OXB:[id number] DOB: 1940/08/29 DOA: 12/16/2021 PCP: [name], MD   Brief Narrative:    Steve Jensen  is a 81 y.o. male reformed smoker with past medical history relevant for morbid obesity/OSA, chronic A-fib status post prior cardioversion chronically on Eliquis for stroke prophylaxis, HTN, chronic diastolic dysfunction CHF and advanced osteoarthritis as well as diabetes mellitus who presents to the ED with complaints of productive cough with colored sputum, shortness of breath and dyspnea on exertion since 12/13/2021.  Patient was admitted for severe sepsis secondary to community-acquired pneumonia, present on admission.  He is also noted to have associated acute hypoxemic respiratory failure.  He now has ileus for which rectal tube has been placed.  Pulmonology consulted due to ongoing hypoxemia and poor progression.  GI consulted due to ongoing ileus.  Assessment & Plan:   Principal Problem:   Pneumonia Active Problems:   Sepsis due to CAP/PNA   Essential hypertension, benign   Obstructive sleep apnea   Morbid obesity (HCC)   Atrial fibrillation ([place])   Uncontrolled type 2 diabetes mellitus with hyperglycemia, without long-term current use of insulin (HCC)   Osteoarthritis of right knee  Assessment and Plan:   Severe sepsis, present on admission, due to CAP/PNA-ongoing - Patient met criteria for severe sepsis due to pneumonia at time of admission; febrile, elevated WBCs, tachycardic/tachypneic, source of infection left lower lobe pneumonia and organ dysfunction his lungs with the presence of hypoxia requiring oxygen supplementation. -As mentioned above continue maintaining adequate hydration, IV antibiotics and further treatment of his pneumonia. -Repeat blood cultures with fever overnight and check urine cultures and chest x-ray -Increase Rocephin to cefepime and continue doxycycline, now IV day 7/7, DC after today -MRSA PCR  negative -Appreciate pulmonology evaluation   Acute hypoxemic respiratory failure secondary to above -Currently on 4 L nasal cannula oxygen, continue to wean as tolerated -Chest x-ray 7/27 with atelectasis -Incentive spirometry -Up in chair with ambulation   Ileus -Started rectal tube 7/27 -Reglan ordered per PCCM, now discontinued -Appreciate ongoing GI evaluation -Medication switch to IV   Osteoarthritis of right knee - Patient complaining of significant pain -Voltaren gel and as needed tramadol will be added to assist with discomfort -Outpatient follow-up with orthopedic service as he has been advised of the need for knee replacement.   Uncontrolled type 2 diabetes mellitus with hyperglycemia, without long-term current use of insulin (HCC) with hyperglycemia -A1c 7.6  -Continue sliding scale insulin; NovoLog for meal coverage added. -Follow CBGs fluctuation and adjust hypoglycemic regimen as required -While inpatient continue holding oral hypoglycemic agents. -Semglee to 13 units daily 7/27   Atrial fibrillation (HCC) - Paroxysmal in nature -Continue telemetry monitoring -Rate and rhythm stable at this time -Continue metoprolol IV and Cardizem for rate control -Continue on full dose Lovenox for now given ileus   Morbid obesity (HCC) -Body mass index is 41.72 kg/m. -Low calorie diet, portion control and increase physical activity discussed with patient.   Obstructive sleep apnea - Continue the use of CPAP nightly   Essential hypertension, benign - Stable overall -Continue current antihypertensive agent -Heart healthy diet discussed with patient.   Mild hypokalemia-ongoing -Replete IV and recheck in a.m.     DVT prophylaxis: Currently full dose Lovenox Code Status: Full Family Communication: Discussed with daughter-in-law 7/27 on phone Disposition Plan:  Status is: Inpatient Remains inpatient appropriate because: Currently on IV antibiotics   Consultants:   Pulmonology GI   Procedures:  None  Antimicrobials:  Anti-infectives (From admission, onward)    Start     Dose/Rate Route Frequency Ordered Stop   12/22/21 1400  ceFEPIme (MAXIPIME) 2 g in sodium chloride 0.9 % 100 mL IVPB  Status:  Discontinued        2 g 200 mL/hr over 30 Minutes Intravenous Every 8 hours 12/22/21 0830 12/22/21 1026   12/22/21 1400  ceFEPIme (MAXIPIME) 2 g in sodium chloride 0.9 % 100 mL IVPB        2 g 200 mL/hr over 30 Minutes Intravenous Every 8 hours 12/22/21 1026 12/23/21 0559   12/22/21 1100  doxycycline (VIBRAMYCIN) 100 mg in sodium chloride 0.9 % 250 mL IVPB        100 mg 125 mL/hr over 120 Minutes Intravenous Every 12 hours 12/22/21 1026 12/23/21 1114   12/20/21 1000  doxycycline (VIBRAMYCIN) 100 mg in sodium chloride 0.9 % 250 mL IVPB  Status:  Discontinued        100 mg 125 mL/hr over 120 Minutes Intravenous Every 12 hours 12/20/21 0705 12/22/21 1026   12/19/21 0830  ceFEPIme (MAXIPIME) 2 g in sodium chloride 0.9 % 100 mL IVPB  Status:  Discontinued        2 g 200 mL/hr over 30 Minutes Intravenous Every 12 hours 12/19/21 0738 12/22/21 0830   12/17/21 0000  cefTRIAXone (ROCEPHIN) 1 g in sodium chloride 0.9 % 100 mL IVPB  Status:  Discontinued        1 g 200 mL/hr over 30 Minutes Intravenous Every 24 hours 12/16/21 2150 12/19/21 0704   12/16/21 2200  doxycycline (VIBRA-TABS) tablet 100 mg  Status:  Discontinued        100 mg Oral Every 12 hours 12/16/21 2150 12/20/21 0704   12/16/21 1945  cefTRIAXone (ROCEPHIN) 1 g in sodium chloride 0.9 % 100 mL IVPB        1 g 200 mL/hr over 30 Minutes Intravenous  Once 12/16/21 1939 12/16/21 2156   12/16/21 1945  doxycycline (VIBRA-TABS) tablet 100 mg        100 mg Oral  Once 12/16/21 1939 12/16/21 2000       Subjective: Patient seen and evaluated today with ongoing abdominal distention, but otherwise doing well.  He refused to work with PT yesterday.  Objective: Vitals:   12/21/21 1942 12/21/21 2027  12/22/21 0409 12/22/21 0937  BP:  138/74 (!) 118/57   Pulse: 77 80 81   Resp: _0 Temp:  98.6 F (37 C) 99 F (37.2 C)   TempSrc:   Oral   SpO2: 93% 97% 96% 94%  Weight:      Height:        Intake/Output Summary (Last 24 hours) at 12/22/2021 1026 Last data filed at 12/22/2021 0900 Gross per 24 hour  Intake 2282.64 ml  Output 1550 ml  Net 732.64 ml   Filed Weights   12/16/21 1524 12/17/21 0003  Weight: 131.5 kg 131.9 kg    Examination:  General exam: Appears calm and comfortable  Respiratory system: Clear to auscultation. Respiratory effort normal.  6 L nasal cannula oxygen Cardiovascular system: S1 & S2 heard, RRR.  Gastrointestinal system: Abdomen is distended Central nervous system: Alert and awake Extremities: No edema Skin: No significant lesions noted Psychiatry: Flat affect.    Data Reviewed: I have personally reviewed following labs and imaging studies  CBC: Recent Labs  Lab 12/16/21 1816 12/17/21 0545 12/18/21 0535 12/19/21 0536 12/20/21 0518 12/21/21 5681 12/22/21 0550  WBC 20.8*   < > 21.2* 20.2* 23.9* 20.9* 16.9*  NEUTROABS 15.7*  --   --   --   --   --   --   HGB 9.0*   < > 8.3* 8.4* 9.2* 8.4* 7.7*  HCT 29.3*   < > 28.2* 29.8* 31.1* 28.6* 25.8*  MCV 66.4*   < > 69.1* 70.1* 67.9* 67.9* 67.9*  PLT 681*   < > 561* 537* 873* 799* 755*   < > = values in this interval not displayed.   Basic Metabolic Panel: Recent Labs  Lab 12/17/21 1823 12/18/21 0535 12/19/21 0536 12/20/21 0518 12/21/21 0526 12/22/21 0550  NA  --  141 138 137 137 138  K  --  3.5 3.8 3.4* 3.3* 3.3*  CL  --  108 107 104 108 111  CO2  --  23 20* 20* 20* 21*  GLUCOSE  --  222* 215* 214* 185* 156*  BUN  --  18 32* 46* 57* 41*  CREATININE  --  1.25* 1.59* 1.44* 1.52* 1.19  CALCIUM  --  8.3* 8.4* 8.8* 8.6* 8.2*  MG 1.9  --  2.4 2.4 2.4 2.2   GFR: Estimated Creatinine Clearance: 68.8 mL/min (by C-G formula based on SCr of 1.19 mg/dL). Liver Function Tests: Recent  Labs  Lab 12/21/21 0543  ALBUMIN 2.3*   No results for input(s): "LIPASE", "AMYLASE" in the last 168 hours. No results for input(s): "AMMONIA" in the last 168 hours. Coagulation Profile: No results for input(s): "INR", "PROTIME" in the last 168 hours. Cardiac Enzymes: No results for input(s): "CKTOTAL", "CKMB", "CKMBINDEX", "TROPONINI" in the last 168 hours. BNP (last 3 results) Recent Labs    02/18/21 1555  PROBNP 99   HbA1C: No results for input(s): "HGBA1C" in the last 72 hours. CBG: Recent Labs  Lab 12/21/21 0724 12/21/21 1107 12/21/21 1618 12/21/21 2027 12/22/21 0715  GLUCAP 178* 231* 193* 166* 173*   Lipid Profile: No results for input(s): "CHOL", "HDL", "LDLCALC", "TRIG", "CHOLHDL", "LDLDIRECT" in the last 72 hours. Thyroid Function Tests: No results for input(s): "TSH", "T4TOTAL", "FREET4", "T3FREE", "THYROIDAB" in the last 72 hours. Anemia Panel: No results for input(s): "VITAMINB12", "FOLATE", "FERRITIN", "TIBC", "IRON", "RETICCTPCT" in the last 72 hours. Sepsis Labs: Recent Labs  Lab 12/16/21 1847 12/16/21 2104 12/18/21 0617 12/19/21 0536 12/20/21 0518  PROCALCITON  --   --  0.32 0.48 0.53  LATICACIDVEN 1.5 1.3  --   --   --     Recent Results (from the past 240 hour(s))  Culture, blood (routine x 2)     Status: None   Collection Time: 12/16/21  6:47 PM   Specimen: BLOOD  Result Value Ref Range Status   Specimen Description   Final    BLOOD LEFT ANTECUBITAL Performed at [facility], [place] [address] [place], [place] [address]    Special Requests   Final    BOTTLES DRAWN AEROBIC AND ANAEROBIC Blood Culture results may not be optimal due to an excessive volume of blood received in culture bottles Performed at [place] [address] [place], [place] [address]    Culture   Final    NO GROWTH 5 DAYS Performed at [facility], [address] [place], [place] [address]    Report Status 12/21/2021 FINAL  Final  Culture, blood  (routine x 2)     Status: None   Collection Time: 12/16/21  6:52 PM   Specimen: BLOOD  Result Value  Ref Range Status   Specimen Description   Final    BLOOD RIGHT ANTECUBITAL Performed at [facility], [place] [address], [place], [place] [address]    Special Requests   Final    BOTTLES DRAWN AEROBIC AND ANAEROBIC Blood Culture adequate volume Performed at [facility], [place] [address] [place], [place] [address]    Culture   Final    NO GROWTH 5 DAYS Performed at [facility], [address], [place], [place] [address]    Report Status 12/21/2021 FINAL  Final  SARS Coronavirus 2 by RT PCR (hospital order, performed in [facility] lab) *cepheid single result test* Anterior Nasal Swab     Status: None   Collection Time: 12/16/21  8:07 PM   Specimen: Anterior Nasal Swab  Result Value Ref Range Status   SARS Coronavirus 2 by RT PCR NEGATIVE NEGATIVE Final    Comment: (NOTE) SARS-CoV-2 target nucleic acids are NOT DETECTED.  The SARS-CoV-2 RNA is generally detectable in upper and lower respiratory specimens during the acute phase of infection. The lowest concentration of SARS-CoV-2 viral copies this assay can detect is 250 copies / mL. A negative result does not preclude SARS-CoV-2 infection and should not be used as the sole basis for treatment or other patient management decisions.  A negative result may occur with improper specimen collection / handling, submission of specimen other than nasopharyngeal swab, presence of viral mutation(s) within the areas targeted by this assay, and inadequate number of viral copies (<250 copies / mL). A negative result must be combined with clinical observations, patient history, and epidemiological information.  Fact Sheet for Patients:   https://www.patel.info/  Fact Sheet for Healthcare Providers: https://hall.com/  This test is not yet approved or  cleared by the [place] FDA  and has been authorized for detection and/or diagnosis of SARS-CoV-2 by FDA under an Emergency Use Authorization (EUA).  This EUA will remain in effect (meaning this test can be used) for the duration of the COVID-19 declaration under Section 564(b)(1) of the Act, 21 U.S.C. section 360bbb-3(b)(1), unless the authorization is terminated or revoked sooner.  Performed at [facility], [address], [place], [place] [address]   MRSA Next Gen by PCR, Nasal     Status: None   Collection Time: 12/19/21  7:05 AM   Specimen: Nasal Mucosa; Nasal Swab  Result Value Ref Range Status   MRSA by PCR Next Gen NOT DETECTED NOT DETECTED Final    Comment: (NOTE) The GeneXpert MRSA Assay (FDA approved for NASAL specimens only), is one component of a comprehensive MRSA colonization surveillance program. It is not intended to diagnose MRSA infection nor to guide or monitor treatment for MRSA infections. Test performance is not FDA approved in patients less than 47 years old. Performed at [facility], [address] [place], [place] [address]   Culture, blood (Routine X 2) w Reflex to ID Panel     Status: None (Preliminary result)   Collection Time: 12/19/21  7:11 AM   Specimen: BLOOD RIGHT HAND  Result Value Ref Range Status   Specimen Description BLOOD RIGHT HAND  Final   Special Requests   Final    BOTTLES DRAWN AEROBIC AND ANAEROBIC Blood Culture adequate volume   Culture   Final    NO GROWTH 3 DAYS Performed at [facility], [address], [place], [place] [address]    Report Status PENDING  Incomplete  Culture, blood (Routine X 2) w Reflex  to ID Panel     Status: None (Preliminary result)   Collection Time: 12/19/21  7:12 AM   Specimen: Right Antecubital; Blood  Result Value Ref Range Status   Specimen Description RIGHT ANTECUBITAL  Final   Special Requests   Final    BOTTLES DRAWN AEROBIC AND ANAEROBIC Blood Culture adequate volume   Culture   Final    NO GROWTH 3 DAYS Performed at  [facility], [address], [place], [place] [address]    Report Status PENDING  Incomplete         Radiology Studies: DG Abd 1 View  Result Date: 12/21/2021 CLINICAL DATA:  Follow-up ileus EXAM: ABDOMEN - 1 VIEW COMPARISON:  December 19, 2021 FINDINGS: Dilated loops of large and small bowel are similar in the interval. No interval changes. IMPRESSION: Dilated loops of large and small bowel are similar in the interval consistent with known ileus. No significant interval change. Electronically Signed   By: Dorise Bullion III M.D.   On: 12/21/2021 12:56        Scheduled Meds:  benzonatate  100 mg Oral TID   bisacodyl  10 mg Rectal Once   budesonide (PULMICORT) nebulizer solution  0.5 mg Nebulization BID   dextromethorphan-guaiFENesin  1 tablet Oral BID   diclofenac Sodium  4 g Topical QID   diltiazem  240 mg Oral Daily   enoxaparin (LOVENOX) injection  130 mg Subcutaneous Q12H   insulin aspart  0-5 Units Subcutaneous QHS   insulin aspart  0-6 Units Subcutaneous TID WC   insulin aspart  4 Units Subcutaneous TID WC   insulin glargine-yfgn  13 Units Subcutaneous Daily   metoprolol succinate  50 mg Oral Daily   pantoprazole (PROTONIX) IV  40 mg Intravenous Q24H   polyethylene glycol  17 g Oral BID   pregabalin  100 mg Oral BID   sodium chloride flush  3 mL Intravenous Q12H   sodium chloride flush  3 mL Intravenous Q12H   Continuous Infusions:  sodium chloride     ceFEPime (MAXIPIME) IV     doxycycline (VIBRAMYCIN) IV     lactated ringers Stopped (12/21/21 1007)   potassium chloride 10 mEq (12/22/21 0943)     LOS: 6 days    Time spent: 35 minutes    Suan Pyeatt Darleen Crocker, DO [facility]  If 7PM-7AM, please contact night-coverage [email or web address] 12/22/2021, 10:26 AM

## 2021-12-22 NOTE — Progress Notes (Signed)
Pharmacy Antibiotic Note  Steve Jensen is a 81 y.o. male admitted on 12/16/2021 with sepsis.  Pharmacy has been consulted for cefepime dosing.  Plan: Cefepime 2000 mg IV every 8 hours. Monitor labs, c/s, and patient improvement.  Height: '5\' 10"'$  (177.8 cm) Weight: 131.9 kg (290 lb 12.6 oz) IBW/kg (Calculated) : 73  Temp (24hrs), Avg:98.7 F (37.1 C), Min:98.4 F (36.9 C), [name]:99 F (37.2 C)  Recent Labs  Lab 12/16/21 1847 12/16/21 2104 12/17/21 0545 12/18/21 0535 12/19/21 0536 12/20/21 0518 12/21/21 0526 12/22/21 0550  WBC  --   --    < > 21.2* 20.2* 23.9* 20.9* 16.9*  CREATININE  --   --    < > 1.25* 1.59* 1.44* 1.52* 1.19  LATICACIDVEN 1.5 1.3  --   --   --   --   --   --    < > = values in this interval not displayed.     Estimated Creatinine Clearance: 68.8 mL/min (by C-G formula based on SCr of 1.19 mg/dL).    Allergies  Allergen Reactions   Lipitor [Atorvastatin] Other (See Comments)    Muscle aches   Simvastatin Other (See Comments)    Muscle aches   Zithromax [Azithromycin] Other (See Comments)    GI-SIDE EFFECTS   Amlodipine Other (See Comments)    Antimicrobials this admission: Cefepime 7/27 >> CTX 7/25 >> 7/26  Microbiology results: 7/24 BCx: NGTD 7/27 Bcx: NGTD 7/25 UCx: pending  7/27 MRSA PCR: negative   Thank you for allowing pharmacy to be a part of this patient's care.  Steve Jensen, PharmD, [facility] Clinical Pharmacist  12/22/2021 8:30 AM

## 2021-12-22 NOTE — Progress Notes (Signed)
MD notified that patient had a 16 beat run of atrial tach. No new orders obtained, mag already ordered for a.m. labs . Will continue to monitor.

## 2021-12-23 ENCOUNTER — Inpatient Hospital Stay (HOSPITAL_COMMUNITY): Payer: Medicare Other

## 2021-12-23 DIAGNOSIS — J189 Pneumonia, unspecified organism: Secondary | ICD-10-CM | POA: Diagnosis not present

## 2021-12-23 LAB — MAGNESIUM: Magnesium: 2.2 mg/dL (ref 1.7–2.4)

## 2021-12-23 LAB — CBC
HCT: 28.1 % — ABNORMAL LOW (ref 39.0–52.0)
Hemoglobin: 8.2 g/dL — ABNORMAL LOW (ref 13.0–17.0)
MCH: 20.6 pg — ABNORMAL LOW (ref 26.0–34.0)
MCHC: 29.2 g/dL — ABNORMAL LOW (ref 30.0–36.0)
MCV: 70.4 fL — ABNORMAL LOW (ref 80.0–100.0)
Platelets: 662 10*3/uL — ABNORMAL HIGH (ref 150–400)
RBC: 3.99 MIL/uL — ABNORMAL LOW (ref 4.22–5.81)
RDW: 20.5 % — ABNORMAL HIGH (ref 11.5–15.5)
WBC: 16.8 10*3/uL — ABNORMAL HIGH (ref 4.0–10.5)
nRBC: 0.2 % (ref 0.0–0.2)

## 2021-12-23 LAB — BASIC METABOLIC PANEL
Anion gap: 5 (ref 5–15)
BUN: 28 mg/dL — ABNORMAL HIGH (ref 8–23)
CO2: 20 mmol/L — ABNORMAL LOW (ref 22–32)
Calcium: 8.6 mg/dL — ABNORMAL LOW (ref 8.9–10.3)
Chloride: 114 mmol/L — ABNORMAL HIGH (ref 98–111)
Creatinine, Ser: 1.08 mg/dL (ref 0.61–1.24)
GFR, Estimated: >60 mL/min (ref >60)
Glucose, Bld: 153 mg/dL — ABNORMAL HIGH (ref 70–99)
Potassium: 3.5 mmol/L (ref 3.5–5.1)
Sodium: 139 mmol/L (ref 135–145)

## 2021-12-23 LAB — GLUCOSE, CAPILLARY
Glucose-Capillary: 154 mg/dL — ABNORMAL HIGH (ref 70–99)
Glucose-Capillary: 144 mg/dL — ABNORMAL HIGH (ref 70–99)
Glucose-Capillary: 129 mg/dL — ABNORMAL HIGH (ref 70–99)
Glucose-Capillary: 107 mg/dL — ABNORMAL HIGH (ref 70–99)

## 2021-12-23 MED ORDER — IOHEXOL 300 MG/ML  SOLN
100.0000 mL | Freq: Once | INTRAMUSCULAR | Status: AC | PRN
  Administered 2021-12-23: 100 mL via INTRAVENOUS

## 2021-12-23 MED ORDER — IOHEXOL 9 MG/ML PO SOLN
ORAL | Status: AC
  Filled 2021-12-23: qty 1000

## 2021-12-23 MED ORDER — POTASSIUM CHLORIDE 10 MEQ/100ML IV SOLN
10.0000 meq | INTRAVENOUS | Status: AC
  Administered 2021-12-23 (×4): 10 meq via INTRAVENOUS
  Filled 2021-12-23 (×4): qty 100

## 2021-12-23 NOTE — NC FL2 (Signed)
[place] LEVEL OF CARE SCREENING TOOL     IDENTIFICATION  Patient Name: Steve Jensen Birthdate: 1940-12-21 Sex: male Admission Date (Current Location): 12/16/2021  [facility] and [place] Number:  [employer] and Address:  [place] [address], [place]      Provider Number: [id number]  Attending Physician Name and Address:  Rodena Goldmann, DO  Relative Name and Phone Number:       Current Level of Care: Hospital Recommended Level of Care: [place] Prior Approval Number:    Date Approved/Denied:   PASRR Number: [id number]  Discharge Plan: SNF    Current Diagnoses: Patient Active Problem List   Diagnosis Date Noted   Uncontrolled type 2 diabetes mellitus with hyperglycemia, without long-term current use of insulin ([place]) 12/17/2021   Osteoarthritis of right knee 12/17/2021   Pneumonia 12/16/2021   Sepsis due to CAP/PNA 12/16/2021   Gilberts (hepatocellular carcinoma) ([place]) 02/15/2018   Hepatocellular carcinoma ([place]) 12/30/2017   Pain in joint of left shoulder 11/09/2017   Hypertensive heart disease with heart failure ([place]) 03/23/2017   Chronic diastolic heart failure ([place]) 03/19/2015   Atrial fibrillation ([place]) 08/11/2013   S/P left knee arthroscopy 07/11/2013   Mixed hyperlipidemia 03/03/2013   Essential hypertension, benign 03/03/2013   Malignant neoplasm of prostate ([place]) 03/03/2013   Osteoarthrosis, unspecified whether generalized or localized, other specified sites 03/03/2013   Allergic rhinitis, cause unspecified 03/03/2013   Type II or unspecified type diabetes mellitus with neurological manifestations, uncontrolled(250.62) 03/03/2013   Polyneuropathy in diabetes(357.2) 03/03/2013   Obstructive sleep apnea 03/03/2013   Proteinuria 03/03/2013   Morbid obesity ([place]) 03/03/2013   Right knee pain 02/07/2013    Orientation RESPIRATION BLADDER Height & Weight     Self, Time, Situation, Place   O2 Incontinent Weight: 290 lb 12.6 oz (131.9 kg) Height:  '5\' 10"'$  (177.8 cm)  BEHAVIORAL SYMPTOMS/MOOD NEUROLOGICAL BOWEL NUTRITION STATUS      Incontinent Diet (See D/C summary)  AMBULATORY STATUS COMMUNICATION OF NEEDS Skin   Extensive Assist Verbally Normal                       Personal Care Assistance Level of Assistance  Bathing, Feeding, Dressing Bathing Assistance: Limited assistance Feeding assistance: Independent Dressing Assistance: Limited assistance     Functional Limitations Info  Sight, Hearing, Speech Sight Info: Impaired Hearing Info: Adequate Speech Info: Adequate    SPECIAL CARE FACTORS FREQUENCY                       Contractures Contractures Info: Not present    Additional Factors Info  Code Status, Allergies Code Status Info: FULL Allergies Info: Lipitor (atorvastatin), Simvastatin, Zithromax (azithromycin), Amlodipine           Current Medications (12/23/2021):  This is the current hospital active medication list Current Facility-Administered Medications  Medication Dose Route Frequency Provider Last Rate Last Admin   0.9 %  sodium chloride infusion   Intravenous PRN Emokpae, Courage, MD       acetaminophen (TYLENOL) tablet 650 mg  650 mg Oral Q6H PRN Emokpae, Courage, MD   650 mg at 12/23/21 0901   Or   acetaminophen (TYLENOL) suppository 650 mg  650 mg Rectal Q6H PRN Emokpae, Courage, MD       albuterol (PROVENTIL) (2.5 MG/3ML) 0.083% nebulizer solution 2.5 mg  2.5 mg Nebulization Q2H PRN Roxan Hockey, MD   2.5  mg at 12/23/21 9449   ALPRAZolam (XANAX) tablet 0.25 mg  0.25 mg Oral BID PRN Heath Lark D, DO   0.25 mg at 12/23/21 6759   benzonatate (TESSALON) capsule 100 mg  100 mg Oral TID Heath Lark D, DO   100 mg at 12/23/21 1638   bisacodyl (DULCOLAX) suppository 10 mg  10 mg Rectal Daily PRN Roxan Hockey, MD       bisacodyl (DULCOLAX) suppository 10 mg  10 mg Rectal Once Manuella Ghazi, Pratik D, DO       budesonide (PULMICORT)  nebulizer solution 0.5 mg  0.5 mg Nebulization BID Barton Dubois, MD   0.5 mg at 12/23/21 0802   dextromethorphan-guaiFENesin ([place] DM) 30-600 MG per 12 hr tablet 1 tablet  1 tablet Oral BID Roxan Hockey, MD   1 tablet at 12/23/21 0855   diclofenac Sodium (VOLTAREN) 1 % topical gel 4 g  4 g Topical QID Manuella Ghazi, Pratik D, DO   4 g at 12/23/21 0856   diltiazem (CARDIZEM CD) 24 hr capsule 240 mg  240 mg Oral Daily Emokpae, Courage, MD   240 mg at 12/23/21 0855   enoxaparin (LOVENOX) injection 130 mg  130 mg Subcutaneous Q12H Shah, Pratik D, DO   130 mg at 12/22/21 2130   guaiFENesin-dextromethorphan (ROBITUSSIN DM) 100-10 MG/5ML syrup 5 mL  5 mL Oral Q4H PRN Emokpae, Courage, MD   5 mL at 12/22/21 0158   insulin aspart (novoLOG) injection 0-5 Units  0-5 Units Subcutaneous QHS Emokpae, Courage, MD   2 Units at 12/20/21 2240   insulin aspart (novoLOG) injection 0-6 Units  0-6 Units Subcutaneous TID WC Emokpae, Courage, MD   1 Units at 12/23/21 0745   insulin aspart (novoLOG) injection 4 Units  4 Units Subcutaneous TID WC Barton Dubois, MD   4 Units at 12/23/21 0745   insulin glargine-yfgn (SEMGLEE) injection 13 Units  13 Units Subcutaneous Daily Heath Lark D, DO   13 Units at 12/22/21 4665   ipratropium-albuterol (DUONEB) 0.5-2.5 (3) MG/3ML nebulizer solution 3 mL  3 mL Nebulization Q6H PRN Manuella Ghazi, Pratik D, DO   3 mL at 12/21/21 1940   metoprolol succinate (TOPROL-XL) 24 hr tablet 50 mg  50 mg Oral Daily Manuella Ghazi, Pratik D, DO   50 mg at 12/23/21 0901   ondansetron (ZOFRAN) tablet 4 mg  4 mg Oral Q6H PRN Emokpae, Courage, MD       Or   ondansetron (ZOFRAN) injection 4 mg  4 mg Intravenous Q6H PRN Emokpae, Courage, MD       Oral care mouth rinse  15 mL Mouth Rinse PRN Manuella Ghazi, Pratik D, DO       pantoprazole (PROTONIX) injection 40 mg  40 mg Intravenous Q24H Shah, Pratik D, DO   40 mg at 12/23/21 0856   polyethylene glycol (MIRALAX / GLYCOLAX) packet 17 g  17 g Oral BID Manuella Ghazi, Pratik D, DO   17 g at  12/23/21 0857   pregabalin (LYRICA) capsule 100 mg  100 mg Oral BID Emokpae, Courage, MD   100 mg at 12/23/21 0901   sodium chloride flush (NS) 0.9 % injection 3 mL  3 mL Intravenous Q12H Emokpae, Courage, MD   3 mL at 12/22/21 2133   sodium chloride flush (NS) 0.9 % injection 3 mL  3 mL Intravenous Q12H Emokpae, Courage, MD   3 mL at 12/22/21 2133   sodium chloride flush (NS) 0.9 % injection 3 mL  3 mL Intravenous PRN Roxan Hockey, MD  traMADol (ULTRAM) tablet 50 mg  50 mg Oral Q8H PRN Barton Dubois, MD   50 mg at 12/18/21 0150   traZODone (DESYREL) tablet 50 mg  50 mg Oral QHS PRN Roxan Hockey, MD   50 mg at 12/22/21 2125     Discharge Medications: Please see discharge summary for a list of discharge medications.  Relevant Imaging Results:  Relevant Lab Results:   Additional Information SSN: [id number] [place]

## 2021-12-23 NOTE — Evaluation (Signed)
Occupational Therapy Evaluation Patient Details Name: Steve Jensen MRN: [id number] DOB: [birth year] Today's Date: 12/23/2021   History of Present Illness Steve Jensen  is a 81 y.o. male reformed smoker with past medical history relevant for morbid obesity/OSA, chronic A-fib status post prior cardioversion chronically on Eliquis for stroke prophylaxis, HTN, chronic diastolic dysfunction CHF and advanced osteoarthritis as well as diabetes mellitus who presents to the ED with complaints of productive cough with colored sputum, shortness of breath and dyspnea on exertion since 12/13/2021.  Patient was admitted for severe sepsis secondary to community-acquired pneumonia, present on admission.  He is also noted to have associated acute hypoxemic respiratory failure.  He now has ileus for which rectal tube has been placed.  Pulmonology consulted due to ongoing hypoxemia and poor progression.   Clinical Impression   Pt agreeable to OT evaluation, daughter-in-law present and providing assistance with PLOF and home set-up. Pt with increased work of breathing during conversation, requesting to sit up on edge of bed. Pt requiring increased time and multiple rest breaks, was able to sit with legs off bed and feet on floor, propped up on elbow for approximately 3 minutes, then returned to supine. Pt currently requiring increased assistance with ADLs and mobility due to SOB, weakness, and fatigue. Pt and family reports he will be returning home, recommend [facility] OT services on discharge to increase safety and independence in ADL completion, functional mobility tasks.      Recommendations for follow up therapy are one component of a multi-disciplinary discharge planning process, led by the attending physician.  Recommendations may be updated based on patient status, additional functional criteria and insurance authorization.   Follow Up Recommendations  Home health OT       Patient can return home with the following  A lot of help with walking and/or transfers;Two people to help with walking and/or transfers;A lot of help with bathing/dressing/bathroom;Assistance with cooking/housework;Assist for transportation;Help with stairs or ramp for entrance    Functional Status Assessment  Patient has had a recent decline in their functional status and demonstrates the ability to make significant improvements in function in a reasonable and predictable amount of time.  Equipment Recommendations  Tub/shower seat;Other (comment) (bedrail)    Recommendations for Other Services       Precautions / Restrictions Precautions Precautions: Fall Restrictions Weight Bearing Restrictions: No      Mobility Bed Mobility Overal bed mobility: Needs Assistance Bed Mobility: Supine to Sit, Sit to Supine     Supine to sit: Min guard, HOB elevated Sit to supine: Mod assist   General bed mobility comments: Pt unable to come to fully seated at EOB, propped up on elbow with BLE on the floor    Transfers                   General transfer comment: unable          ADL either performed or assessed with clinical judgement   ADL                                         General ADL Comments: Pt requiring [place] to total assist with ADLs due to weakness and fatigue      Pertinent Vitals/Pain Pain Assessment Pain Assessment: No/denies pain     Hand Dominance Right   Extremity/Trunk Assessment Upper Extremity Assessment Upper Extremity Assessment: Overall WFL for  tasks assessed   Lower Extremity Assessment Lower Extremity Assessment: Defer to PT evaluation   Cervical / Trunk Assessment Cervical / Trunk Assessment: Normal   Communication Communication Communication: No difficulties   Cognition Arousal/Alertness: Awake/alert Behavior During Therapy: WFL for tasks assessed/performed Overall Cognitive Status: Within Functional Limits for tasks assessed                                                   Home Living Family/patient expects to be discharged to:: Private residence Living Arrangements: Other (Comment) (grandson & wife, 2 kids) Available Help at Discharge: Family;Available 24 hours/day Type of Home: House Home Access: Stairs to enter [employer] of Steps: 3 Entrance Stairs-Rails: Right;Left Home Layout: Two level;Laundry or work area in basement     [employer] Shower/Tub: Teacher, early years/pre: [place]: Conservation officer, nature (2 wheels);Cane - single point;BSC/3in1;Grab bars - tub/shower          Prior Functioning/Environment Prior Level of Function : Independent/Modified Independent             Mobility Comments: independent, used SPC or RW as needed ADLs Comments: independent        OT Problem List: Decreased range of motion;Decreased strength;Decreased activity tolerance;Impaired balance (sitting and/or standing);Decreased safety awareness;Decreased knowledge of use of DME or AE      OT Treatment/Interventions: Self-care/ADL training;Therapeutic exercise;Energy conservation;DME and/or AE instruction;Therapeutic activities;Patient/family education    OT Goals(Current goals can be found in the care plan section) Acute Rehab OT Goals Patient Stated Goal: To sit up and go home OT Goal Formulation: With patient Time For Goal Achievement: 01/06/22 Potential to Achieve Goals: Good  OT Frequency: Min 1X/week          End of Session Equipment Utilized During Treatment: Oxygen  Activity Tolerance: Patient limited by fatigue Patient left: in bed;with call bell/phone within reach;with family/visitor present  OT Visit Diagnosis: Muscle weakness (generalized) (M62.81)                Time: 5859-2924 OT Time Calculation (min): 38 min Charges:  OT General Charges $OT Visit: 1 Visit OT Evaluation $OT Eval Low Complexity: 1 Low OT Treatments $Therapeutic Activity: 8-22  mins    Guadelupe Sabin, OTR/L  [phone] 12/23/2021, 3:53 PM

## 2021-12-23 NOTE — Progress Notes (Signed)
[facility] ([facility]) Hospital Liaison Note  Notified by TOC manager of patient/family request for [facility] palliative services at home after discharge.   [facility] hospital liaison will follow patient for discharge disposition.   Please call with any hospice or outpatient palliative care related questions.   Thank you for the opportunity to participate in this patient's care.   Shanita Wicker, LCSW [facility] Hospital Liaison [phone]  

## 2021-12-23 NOTE — Progress Notes (Signed)
Pt on home unit for the night

## 2021-12-23 NOTE — Progress Notes (Signed)
Gastroenterology Progress Note   Referring Provider: No ref. provider found Primary Care Physician:  [name], MD Primary Gastroenterologist:  Dr. Jenetta Downer (previously unassigned)  Patient ID: Steve Jensen; [id number]; July 15, 1940    Subjective   Patient denies abdominal pain, nasuea, vomiting. He reports his belly feels about the same as yesterday. His daughter who is at the bedside reports his belly looks baseline to her. He reports he did not know that rectal tube was still in place. He reports he is not able to get up and walk much and that he is fatigued.   Objective   Vital signs in last 24 hours Temp:  [98 F (36.7 C)-99.2 F (37.3 C)] 99.2 F (37.3 C) (07/31 0505) Pulse Rate:  [76-86] 76 (07/31 0505) Resp:  [18] 18 (07/31 0505) BP: (132-146)/(62-66) 142/65 (07/31 0505) SpO2:  [94 %-98 %] 95 % (07/31 0804) Last BM Date : 12/22/21  Physical Exam General:   Alert and oriented vs confused Head:  Normocephalic and atraumatic. Eyes:  No icterus, sclera clear. Conjuctiva pink.  Heart:  S1, S2 present, no murmurs noted.  Lungs: Clear to auscultation bilaterally, without wheezing, rales, or rhonchi.  Abdomen:  Bowel sounds present - high pitched tinkling, soft, non-tender, non-distended. No HSM or hernias noted. No rebound or guarding. No masses appreciated. Rectal: Tube in place with some loose green stool present.  Extremities:  Without clubbing or edema. Neurologic:  Alert and  oriented x4; intermittently confused Psych:  Alert and cooperative. Normal mood and affect.  Intake/Output from previous day: 07/30 0701 - 07/31 0700 In: 2233.8 [P.O.:540; I.V.:493.8; IV Piggyback:1200] Out: 1650 [Urine:1150; Stool:500] Intake/Output this shift: No intake/output data recorded.  Lab Results  Recent Labs    12/21/21 0526 12/22/21 0550 12/23/21 0604  WBC 20.9* 16.9* 16.8*  HGB 8.4* 7.7* 8.2*  HCT 28.6* 25.8* 28.1*  PLT 799* 755* 662*   BMET Recent Labs     12/21/21 0526 12/22/21 0550 12/23/21 0604  NA 137 138 139  K 3.3* 3.3* 3.5  CL 108 111 114*  CO2 20* 21* 20*  GLUCOSE 185* 156* 153*  BUN 57* 41* 28*  CREATININE 1.52* 1.19 1.08  CALCIUM 8.6* 8.2* 8.6*   LFT Recent Labs    12/21/21 0543  ALBUMIN 2.3*   PT/INR No results for input(s): "LABPROT", "INR" in the last 72 hours. Hepatitis Panel No results for input(s): "HEPBSAG", "HCVAB", "HEPAIGM", "HEPBIGM" in the last 72 hours.   Studies/Results DG Abd 1 View  Result Date: 12/23/2021 CLINICAL DATA:  Increasing shortness of breath. Sepsis. Multiple underlying medical problems. EXAM: ABDOMEN - 1 VIEW COMPARISON:  Radiographs 12/21/2021 and 12/19/2021.  CT 10/15/2021. FINDINGS: 0527 hours. Four supine views of the abdomen are submitted. Diffuse dilatation of the small and large bowel is again noted, without significant change. No supine evidence of free intraperitoneal air. Postsurgical changes are noted within the pelvis. There are no suspicious abdominal calcifications. IMPRESSION: No significant change in mild diffuse bowel distension, most consistent with an ileus. No new findings. Electronically Signed   By: Richardean Sale M.D.   On: 12/23/2021 08:12   DG Abd 1 View  Result Date: 12/21/2021 CLINICAL DATA:  Follow-up ileus EXAM: ABDOMEN - 1 VIEW COMPARISON:  December 19, 2021 FINDINGS: Dilated loops of large and small bowel are similar in the interval. No interval changes. IMPRESSION: Dilated loops of large and small bowel are similar in the interval consistent with known ileus. No significant interval change. Electronically Signed  By: Dorise Bullion III M.D.   On: 12/21/2021 12:56   DG Chest 1 View  Result Date: 12/20/2021 CLINICAL DATA:  Increasing shortness of breath. EXAM: CHEST  1 VIEW COMPARISON:  Lane chest yesterday at 9:33 a.m. FINDINGS: 4:54 a.m. There is interval partial clearance of patchy atelectasis or pneumonic infiltrate from the left lower lobe. Linear  scarring or atelectasis in the left mid lung continues to be seen. Remaining lungs generally clear. No pleural effusion is evident. There is aortic calcification and tortuosity with stable mediastinum. There is mild cardiomegaly. Central vessels are normal caliber. There is thoracic spondylosis. IMPRESSION: Improved aeration in the left lower lobe. Patchy infiltrate or atelectasis remains but is less confluent. Electronically Signed   By: Telford Nab M.D.   On: 12/20/2021 05:31   DG Abd 1 View  Result Date: 12/19/2021 CLINICAL DATA:  Ileus EXAM: ABDOMEN - 1 VIEW COMPARISON:  CT 10/15/2021 FINDINGS: Gaseous distension of large and small bowel loops throughout the abdomen. No gross free intraperitoneal air. No radio-opaque calculi or other significant radiographic abnormality are seen. IMPRESSION: Gaseous distension of large and small bowel loops throughout the abdomen, most suggestive of ileus. Electronically Signed   By: Davina Poke D.O.   On: 12/19/2021 12:03   DG CHEST PORT 1 VIEW  Result Date: 12/19/2021 CLINICAL DATA:  Cough with shortness of breath and chest pain EXAM: PORTABLE CHEST 1 VIEW COMPARISON:  Chest x-ray dated December 16, 2021 FINDINGS: Cardiac and mediastinal contours are unchanged when accounting for AP technique and lung volumes. Low lung volumes with hypoventilatory changes. Mild left basilar opacity which is likely due to atelectasis. No large pleural effusion or pneumothorax. Numerous dilated gas-filled loops of bowel are seen in the partially visualized upper abdomen. IMPRESSION: 1. Low lung volumes with hypoventilatory changes. Mild left basilar opacity which is likely due to atelectasis. 2. Numerous dilated gas-filled loops of bowel seen in the partially visualized upper abdomen, recommend clinical correlation and consider dedicated abdominal radiograph for further evaluation. Electronically Signed   By: Yetta Glassman M.D.   On: 12/19/2021 09:44   DG Chest Port 1  View  Result Date: 12/16/2021 CLINICAL DATA:  Shortness of breath EXAM: PORTABLE CHEST 1 VIEW COMPARISON:  02/28/2021 FINDINGS: Cardiac size upper limits of normal. Slight central congestion. No pleural effusion. Patchy airspace disease at the lingula and left base. No pneumothorax IMPRESSION: Patchy airspace disease at the lingula and left base, atelectasis versus pneumonia. Electronically Signed   By: Donavan Foil M.D.   On: 12/16/2021 18:34    Assessment  81 y.o. male with a history of diabetes, GERD, HLD, HTN, morbid obesity, OSA, neuropathy, prostate cancer who was admitted to the hospital on 7/24 after presenting worsening shortness of breath.  He was diagnosed with severe sepsis due to pneumonia for which she was started on IV antibiotics and oxygen supplementation.  GI consulted for evaluation of ileus.   Ileus: Patient presented with new onset ileus in the setting of severe sepsis.  He did have some recent narcotic exposure which likely aggravated his current condition.  KUB 7/27 showed numerous dilated gas loops in the large and small bowel suggestive of ileus.  On 7/29 patient potassium 3.3, magnesium 2.4, calcium 8.6, CBC with WBC 20.9, hemoglobin 8.4, platelets 79 9.  Repeat KUB on 7/29 showed persistent bowel dilation although not critical similar to previous images.  He did have a large bowel movement on 7/29 which initially improved his symptoms however patient reported being  more distended yesterday 7/30.  Over the weekend Dr. Jenetta Downer saw patient for consult and discussed potential endoscopic decompression that was requested by the primary team with the patient and both initially agreed to proceed with more conservative measures such as frequent mobilization, repletion of electrolytes, and ongoing laxatives. Patient has not been very mobile over the last few days.  Repeat KUB today showing no significant change in diffuse small and large bowel distention.  Rectal tube output recorded in  the last 24 hours is 500 cc total and 1 unmeasured stool occurrence. Patient reports he feels about the same today and continues to deny nausea or vomiting or any abdominal pain. Potassium 3.5, Calcium 8.6 today. Will obtain CT A/P to further evaluate and may consider endoscopic decompression if he does not improve.   I discussed the importance of being mobile as means to improve ileus with the daughter and the patient as endoscopic decompression caries risk of perforation and bleeding.   Plan / Recommendations  Continue MiraLAX twice daily Maintain potassium > 4, corrected calcium should be > 9. Minimize use of narcotics Frequent ambulation Strict monitoring of rectal tube output Remain NPO CT Abdomen/pelvis today    LOS: 7 days    12/23/2021, 8:33 AM   Venetia Night, MSN, FNP-BC, AGACNP-BC [facility]

## 2021-12-23 NOTE — TOC Progression Note (Addendum)
Transition of Care [facility]) - Progression Note    Patient Details  Name: Steve Jensen MRN: [id number] Date of Birth: 07/17/1940  Transition of Care [facility] CM/SW [place], [place] Phone Number: 12/23/2021, 9:47 AM  Clinical Narrative:    TOC noted that PT is recommending SNF at D/C. CSW met with pt in room to speak about this. Pt is agreeable to a SNF referral. Pt would only like it sent to [facility], [facility], [facility]. CSW completed Fl2 and PASRR. SNF referral has been sent out. CSW to met with pt to go over bed offers when able. TOC to follow.   Addendum 10:45am: CSW called to room by RN due to granddaughter [name] being present with concerns. Granddaughter states that pt will not go to SNF as he lives with family and they are planning for return home. They would like home with HH. Pt will also need bariatric 3N1 and possibly shower bench. CSW reached out to [place] with [employer] who states that they will be able to get the bari 3N1 for pt and the shower chair. CSW updated [name]. CSW updated [name] that contact for payment on shower bench is [place]. [name] also request referral to [facility] palliative be made. CSW spoke to [place] with [facility] who states that they will work on referral. TOC to follow.   Expected Discharge Plan: Home/Self Care Barriers to Discharge: Continued Medical Work up  Expected Discharge Plan and Services Expected Discharge Plan: Home/Self Care In-house Referral: Clinical Social Work Discharge Planning Services: CM Consult   Living arrangements for the past 2 months: Single Family Home                                       Social Determinants of Health (SDOH) Interventions    Readmission Risk Interventions    12/19/2021   12:39 PM  Readmission Risk Prevention Plan  Transportation Screening Complete  HRI or [place] Complete  Social Work Consult for [place] Planning/Counseling Complete  Palliative Care Screening Not  Applicable  Medication Review Press photographer) Complete

## 2021-12-23 NOTE — Plan of Care (Signed)
  Problem: Acute Rehab OT Goals (only OT should resolve) Goal: Pt. Will Perform Eating Flowsheets (Taken 12/23/2021 1555) Pt Will Perform Eating:  with modified independence  sitting Goal: Pt. Will Perform Grooming Flowsheets (Taken 12/23/2021 1555) Pt Will Perform Grooming:  with modified independence  sitting  standing Goal: Pt. Will Perform Upper Body Dressing Flowsheets (Taken 12/23/2021 1555) Pt Will Perform Upper Body Dressing:  with supervision  sitting Goal: Pt. Will Transfer To Toilet Flowsheets (Taken 12/23/2021 1555) Pt Will Transfer to Toilet:  with min guard assist  stand pivot transfer  ambulating  regular height toilet  bedside commode Goal: Pt. Will Perform Toileting-Clothing Manipulation Flowsheets (Taken 12/23/2021 1555) Pt Will Perform Toileting - Clothing Manipulation and hygiene:  with supervision  sitting/lateral leans  sit to/from stand

## 2021-12-23 NOTE — Progress Notes (Signed)
PROGRESS NOTE    Steve Jensen  MMH:[id number] DOB: [birth year] DOA: 12/16/2021 PCP: [name], MD   Brief Narrative:    Steve Jensen  is a 81 y.o. male reformed smoker with past medical history relevant for morbid obesity/OSA, chronic A-fib status post prior cardioversion chronically on Eliquis for stroke prophylaxis, HTN, chronic diastolic dysfunction CHF and advanced osteoarthritis as well as diabetes mellitus who presents to the ED with complaints of productive cough with colored sputum, shortness of breath and dyspnea on exertion since 12/13/2021.  Patient was admitted for severe sepsis secondary to community-acquired pneumonia, present on admission.  He is also noted to have associated acute hypoxemic respiratory failure.  He now has ileus for which rectal tube has been placed.  Pulmonology consulted due to ongoing hypoxemia and poor progression.  GI consulted due to ongoing ileus.  Assessment & Plan:   Principal Problem:   Pneumonia Active Problems:   Sepsis due to CAP/PNA   Essential hypertension, benign   Obstructive sleep apnea   Morbid obesity (HCC)   Atrial fibrillation ([place])   Uncontrolled type 2 diabetes mellitus with hyperglycemia, without long-term current use of insulin (HCC)   Osteoarthritis of right knee  Assessment and Plan:  Severe sepsis, present on admission, due to CAP/PNA-ongoing - Patient met criteria for severe sepsis due to pneumonia at time of admission; febrile, elevated WBCs, tachycardic/tachypneic, source of infection left lower lobe pneumonia and organ dysfunction his lungs with the presence of hypoxia requiring oxygen supplementation. -As mentioned above continue maintaining adequate hydration, IV antibiotics and further treatment of his pneumonia. -Repeat blood cultures with fever overnight and check urine cultures and chest x-ray -Increase Rocephin to cefepime and continue doxycycline, now IV day 7/7, DC after today -MRSA PCR  negative -Appreciate pulmonology evaluation   Acute hypoxemic respiratory failure secondary to above -Currently on 4 L nasal cannula oxygen, continue to wean as tolerated -Chest x-ray 7/27 with atelectasis -Incentive spirometry -Up in chair with ambulation   Ileus -Started rectal tube 7/27 -Encouraged ambulation -Reglan ordered per PCCM, now discontinued -Appreciate ongoing GI evaluation -Medication switch to IV   Osteoarthritis of right knee - Patient complaining of significant pain -Voltaren gel and as needed tramadol will be added to assist with discomfort -Outpatient follow-up with orthopedic service as he has been advised of the need for knee replacement.   Uncontrolled type 2 diabetes mellitus with hyperglycemia, without long-term current use of insulin (HCC) with hyperglycemia -A1c 7.6  -Continue sliding scale insulin; NovoLog for meal coverage added. -Follow CBGs fluctuation and adjust hypoglycemic regimen as required -While inpatient continue holding oral hypoglycemic agents. -Semglee to 13 units daily 7/27   Atrial fibrillation (HCC) - Paroxysmal in nature -Continue telemetry monitoring -Rate and rhythm stable at this time -Continue metoprolol IV and Cardizem for rate control -Continue on full dose Lovenox for now given ileus   Morbid obesity (HCC) -Body mass index is 41.72 kg/m. -Low calorie diet, portion control and increase physical activity discussed with patient.   Obstructive sleep apnea - Continue the use of CPAP nightly   Essential hypertension, benign - Stable overall -Continue current antihypertensive agent -Heart healthy diet discussed with patient.   Mild hypokalemia-ongoing -Replete IV and recheck in a.m.     DVT prophylaxis: Currently full dose Lovenox Code Status: Full Family Communication: Discussed with daughter-in-law 7/27 on phone Disposition Plan: Family refusing SNF Status is: Inpatient Remains inpatient appropriate because:  Currently on IV antibiotics   Consultants:  Pulmonology GI  Procedures:  None   Antimicrobials:  Anti-infectives (From admission, onward)    Start     Dose/Rate Route Frequency Ordered Stop   12/22/21 1400  ceFEPIme (MAXIPIME) 2 g in sodium chloride 0.9 % 100 mL IVPB  Status:  Discontinued        2 g 200 mL/hr over 30 Minutes Intravenous Every 8 hours 12/22/21 0830 12/22/21 1026   12/22/21 1400  ceFEPIme (MAXIPIME) 2 g in sodium chloride 0.9 % 100 mL IVPB        2 g 200 mL/hr over 30 Minutes Intravenous Every 8 hours 12/22/21 1026 12/22/21 2159   12/22/21 1100  doxycycline (VIBRAMYCIN) 100 mg in sodium chloride 0.9 % 250 mL IVPB        100 mg 125 mL/hr over 120 Minutes Intravenous Every 12 hours 12/22/21 1026 12/23/21 0014   12/20/21 1000  doxycycline (VIBRAMYCIN) 100 mg in sodium chloride 0.9 % 250 mL IVPB  Status:  Discontinued        100 mg 125 mL/hr over 120 Minutes Intravenous Every 12 hours 12/20/21 0705 12/22/21 1026   12/19/21 0830  ceFEPIme (MAXIPIME) 2 g in sodium chloride 0.9 % 100 mL IVPB  Status:  Discontinued        2 g 200 mL/hr over 30 Minutes Intravenous Every 12 hours 12/19/21 0738 12/22/21 0830   12/17/21 0000  cefTRIAXone (ROCEPHIN) 1 g in sodium chloride 0.9 % 100 mL IVPB  Status:  Discontinued        1 g 200 mL/hr over 30 Minutes Intravenous Every 24 hours 12/16/21 2150 12/19/21 0704   12/16/21 2200  doxycycline (VIBRA-TABS) tablet 100 mg  Status:  Discontinued        100 mg Oral Every 12 hours 12/16/21 2150 12/20/21 0704   12/16/21 1945  cefTRIAXone (ROCEPHIN) 1 g in sodium chloride 0.9 % 100 mL IVPB        1 g 200 mL/hr over 30 Minutes Intravenous  Once 12/16/21 1939 12/16/21 2156   12/16/21 1945  doxycycline (VIBRA-TABS) tablet 100 mg        100 mg Oral  Once 12/16/21 1939 12/16/21 2000       Subjective: Patient seen and evaluated today with ongoing abdominal distention and intermittent confusion.  Objective: Vitals:   12/22/21 1919 12/22/21  2052 12/23/21 0505 12/23/21 0804  BP:  (!) 146/62 (!) 142/65   Pulse:  81 76   Resp:   18   Temp:  98 F (36.7 C) 99.2 F (37.3 C)   TempSrc:  Oral Oral   SpO2: 95% 95% 96% 95%  Weight:      Height:        Intake/Output Summary (Last 24 hours) at 12/23/2021 1242 Last data filed at 12/23/2021 0700 Gross per 24 hour  Intake 2053.83 ml  Output 2150 ml  Net -96.17 ml   Filed Weights   12/16/21 1524 12/17/21 0003  Weight: 131.5 kg 131.9 kg    Examination:  General exam: Appears calm and comfortable  Respiratory system: Clear to auscultation. Respiratory effort normal.  Nasal cannula 5 L Cardiovascular system: S1 & S2 heard, RRR.  Gastrointestinal system: Abdomen is distended, bowel sounds present Central nervous system: Alert and awake Extremities: No edema Skin: No significant lesions noted Psychiatry: Flat affect.    Data Reviewed: I have personally reviewed following labs and imaging studies  CBC: Recent Labs  Lab 12/16/21 1816 12/17/21 0545 12/19/21 0536 12/20/21 0518 12/21/21 6644 12/22/21 0550 12/23/21 0347  WBC 20.8*   < > 20.2* 23.9* 20.9* 16.9* 16.8*  NEUTROABS 15.7*  --   --   --   --   --   --   HGB 9.0*   < > 8.4* 9.2* 8.4* 7.7* 8.2*  HCT 29.3*   < > 29.8* 31.1* 28.6* 25.8* 28.1*  MCV 66.4*   < > 70.1* 67.9* 67.9* 67.9* 70.4*  PLT 681*   < > 537* 873* 799* 755* 662*   < > = values in this interval not displayed.   Basic Metabolic Panel: Recent Labs  Lab 12/19/21 0536 12/20/21 0518 12/21/21 0526 12/22/21 0550 12/23/21 0604  NA 138 137 137 138 139  K 3.8 3.4* 3.3* 3.3* 3.5  CL 107 104 108 111 114*  CO2 20* 20* 20* 21* 20*  GLUCOSE 215* 214* 185* 156* 153*  BUN 32* 46* 57* 41* 28*  CREATININE 1.59* 1.44* 1.52* 1.19 1.08  CALCIUM 8.4* 8.8* 8.6* 8.2* 8.6*  MG 2.4 2.4 2.4 2.2 2.2   GFR: Estimated Creatinine Clearance: 75.8 mL/min (by C-G formula based on SCr of 1.08 mg/dL). Liver Function Tests: Recent Labs  Lab 12/21/21 0543  ALBUMIN  2.3*   No results for input(s): "LIPASE", "AMYLASE" in the last 168 hours. No results for input(s): "AMMONIA" in the last 168 hours. Coagulation Profile: No results for input(s): "INR", "PROTIME" in the last 168 hours. Cardiac Enzymes: No results for input(s): "CKTOTAL", "CKMB", "CKMBINDEX", "TROPONINI" in the last 168 hours. BNP (last 3 results) Recent Labs    02/18/21 1555  PROBNP 99   HbA1C: No results for input(s): "HGBA1C" in the last 72 hours. CBG: Recent Labs  Lab 12/22/21 1129 12/22/21 1623 12/22/21 2048 12/23/21 0738 12/23/21 1200  GLUCAP 252* 172* 177* 154* 144*   Lipid Profile: No results for input(s): "CHOL", "HDL", "LDLCALC", "TRIG", "CHOLHDL", "LDLDIRECT" in the last 72 hours. Thyroid Function Tests: No results for input(s): "TSH", "T4TOTAL", "FREET4", "T3FREE", "THYROIDAB" in the last 72 hours. Anemia Panel: No results for input(s): "VITAMINB12", "FOLATE", "FERRITIN", "TIBC", "IRON", "RETICCTPCT" in the last 72 hours. Sepsis Labs: Recent Labs  Lab 12/16/21 1847 12/16/21 2104 12/18/21 0617 12/19/21 0536 12/20/21 0518  PROCALCITON  --   --  0.32 0.48 0.53  LATICACIDVEN 1.5 1.3  --   --   --     Recent Results (from the past 240 hour(s))  Culture, blood (routine x 2)     Status: None   Collection Time: 12/16/21  6:47 PM   Specimen: BLOOD  Result Value Ref Range Status   Specimen Description   Final    BLOOD LEFT ANTECUBITAL Performed at [facility], [place] [address], [place], [place] [address]    Special Requests   Final    BOTTLES DRAWN AEROBIC AND ANAEROBIC Blood Culture results may not be optimal due to an excessive volume of blood received in culture bottles Performed at [place] [address] [place], [place] [address]    Culture   Final    NO GROWTH 5 DAYS Performed at [facility], [address] [place], [place] [address]    Report Status 12/21/2021 FINAL  Final  Culture, blood (routine x 2)     Status: None    Collection Time: 12/16/21  6:52 PM   Specimen: BLOOD  Result Value Ref Range Status   Specimen Description   Final    BLOOD RIGHT ANTECUBITAL Performed at [facility], [place] [address], [place], [place] [address]  Special Requests   Final    BOTTLES DRAWN AEROBIC AND ANAEROBIC Blood Culture adequate volume Performed at [facility], [place] [address], [place], [place] [address]    Culture   Final    NO GROWTH 5 DAYS Performed at [facility], [address] [place], [place] [address]    Report Status 12/21/2021 FINAL  Final  SARS Coronavirus 2 by RT PCR (hospital order, performed in [facility] hospital lab) *cepheid single result test* Anterior Nasal Swab     Status: None   Collection Time: 12/16/21  8:07 PM   Specimen: Anterior Nasal Swab  Result Value Ref Range Status   SARS Coronavirus 2 by RT PCR NEGATIVE NEGATIVE Final    Comment: (NOTE) SARS-CoV-2 target nucleic acids are NOT DETECTED.  The SARS-CoV-2 RNA is generally detectable in upper and lower respiratory specimens during the acute phase of infection. The lowest concentration of SARS-CoV-2 viral copies this assay can detect is 250 copies / mL. A negative result does not preclude SARS-CoV-2 infection and should not be used as the sole basis for treatment or other patient management decisions.  A negative result may occur with improper specimen collection / handling, submission of specimen other than nasopharyngeal swab, presence of viral mutation(s) within the areas targeted by this assay, and inadequate number of viral copies (<250 copies / mL). A negative result must be combined with clinical observations, patient history, and epidemiological information.  Fact Sheet for Patients:   https://www.patel.info/  Fact Sheet for Healthcare Providers: https://hall.com/  This test is not yet approved or  cleared by the [place] FDA and has been authorized for  detection and/or diagnosis of SARS-CoV-2 by FDA under an Emergency Use Authorization (EUA).  This EUA will remain in effect (meaning this test can be used) for the duration of the COVID-19 declaration under Section 564(b)(1) of the Act, 21 U.S.C. section 360bbb-3(b)(1), unless the authorization is terminated or revoked sooner.  Performed at [facility], [address], [place], [place] [address]   MRSA Next Gen by PCR, Nasal     Status: None   Collection Time: 12/19/21  7:05 AM   Specimen: Nasal Mucosa; Nasal Swab  Result Value Ref Range Status   MRSA by PCR Next Gen NOT DETECTED NOT DETECTED Final    Comment: (NOTE) The GeneXpert MRSA Assay (FDA approved for NASAL specimens only), is one component of a comprehensive MRSA colonization surveillance program. It is not intended to diagnose MRSA infection nor to guide or monitor treatment for MRSA infections. Test performance is not FDA approved in patients less than 39 years old. Performed at [facility], [address], [place], [place] [address]   Culture, blood (Routine X 2) w Reflex to ID Panel     Status: None (Preliminary result)   Collection Time: 12/19/21  7:11 AM   Specimen: BLOOD RIGHT HAND  Result Value Ref Range Status   Specimen Description BLOOD RIGHT HAND  Final   Special Requests   Final    BOTTLES DRAWN AEROBIC AND ANAEROBIC Blood Culture adequate volume   Culture   Final    NO GROWTH 4 DAYS Performed at [facility], [address] [place], [place] [address]    Report Status PENDING  Incomplete  Culture, blood (Routine X 2) w Reflex to ID Panel     Status: None (Preliminary result)   Collection Time: 12/19/21  7:12 AM   Specimen: Right Antecubital; Blood  Result Value Ref Range Status  Specimen Description RIGHT ANTECUBITAL  Final   Special Requests   Final    BOTTLES DRAWN AEROBIC AND ANAEROBIC Blood Culture adequate volume   Culture   Final    NO GROWTH 4 DAYS Performed at [facility], [address], [place],  [address]    Report Status PENDING  Incomplete         Radiology Studies: DG Abd 1 View  Result Date: 12/23/2021 CLINICAL DATA:  Increasing shortness of breath. Sepsis. Multiple underlying medical problems. EXAM: ABDOMEN - 1 VIEW COMPARISON:  Radiographs 12/21/2021 and 12/19/2021.  CT 10/15/2021. FINDINGS: 0527 hours. Four supine views of the abdomen are submitted. Diffuse dilatation of the small and large bowel is again noted, without significant change. No supine evidence of free intraperitoneal air. Postsurgical changes are noted within the pelvis. There are no suspicious abdominal calcifications. IMPRESSION: No significant change in mild diffuse bowel distension, most consistent with an ileus. No new findings. Electronically Signed   By: Richardean Sale M.D.   On: 12/23/2021 08:12        Scheduled Meds:  benzonatate  100 mg Oral TID   bisacodyl  10 mg Rectal Once   budesonide (PULMICORT) nebulizer solution  0.5 mg Nebulization BID   dextromethorphan-guaiFENesin  1 tablet Oral BID   diclofenac Sodium  4 g Topical QID   diltiazem  240 mg Oral Daily   enoxaparin (LOVENOX) injection  130 mg Subcutaneous Q12H   insulin aspart  0-5 Units Subcutaneous QHS   insulin aspart  0-6 Units Subcutaneous TID WC   insulin aspart  4 Units Subcutaneous TID WC   insulin glargine-yfgn  13 Units Subcutaneous Daily   metoprolol succinate  50 mg Oral Daily   pantoprazole (PROTONIX) IV  40 mg Intravenous Q24H   polyethylene glycol  17 g Oral BID   pregabalin  100 mg Oral BID   sodium chloride flush  3 mL Intravenous Q12H   sodium chloride flush  3 mL Intravenous Q12H   Continuous Infusions:  sodium chloride     potassium chloride       LOS: 7 days    Time spent: 35 minutes    Kian Ottaviano Darleen Crocker, DO [facility]  If 7PM-7AM, please contact night-coverage [email or web address] 12/23/2021, 12:42 PM

## 2021-12-24 ENCOUNTER — Inpatient Hospital Stay (HOSPITAL_COMMUNITY): Payer: Medicare Other | Admitting: Anesthesiology

## 2021-12-24 ENCOUNTER — Encounter (HOSPITAL_COMMUNITY)
Admission: EM | Disposition: A | Discharge status: Skilled Nursing Facility | Payer: Self-pay | Source: Home / Self Care | Attending: Internal Medicine

## 2021-12-24 ENCOUNTER — Encounter (HOSPITAL_COMMUNITY): Payer: Self-pay | Admitting: Family Medicine

## 2021-12-24 DIAGNOSIS — G4730 Sleep apnea, unspecified: Secondary | ICD-10-CM

## 2021-12-24 DIAGNOSIS — J189 Pneumonia, unspecified organism: Secondary | ICD-10-CM | POA: Diagnosis not present

## 2021-12-24 DIAGNOSIS — K567 Ileus, unspecified: Secondary | ICD-10-CM | POA: Diagnosis not present

## 2021-12-24 DIAGNOSIS — R933 Abnormal findings on diagnostic imaging of other parts of digestive tract: Secondary | ICD-10-CM

## 2021-12-24 DIAGNOSIS — E119 Type 2 diabetes mellitus without complications: Secondary | ICD-10-CM

## 2021-12-24 DIAGNOSIS — I10 Essential (primary) hypertension: Secondary | ICD-10-CM | POA: Diagnosis not present

## 2021-12-24 DIAGNOSIS — Z7984 Long term (current) use of oral hypoglycemic drugs: Secondary | ICD-10-CM

## 2021-12-24 DIAGNOSIS — Z87891 Personal history of nicotine dependence: Secondary | ICD-10-CM

## 2021-12-24 DIAGNOSIS — Z7985 Long-term (current) use of injectable non-insulin antidiabetic drugs: Secondary | ICD-10-CM

## 2021-12-24 HISTORY — PX: FLEXIBLE SIGMOIDOSCOPY: SHX5431

## 2021-12-24 LAB — URINALYSIS, ROUTINE W REFLEX MICROSCOPIC
Bacteria, UA: NONE SEEN
Bilirubin Urine: NEGATIVE
Glucose, UA: NEGATIVE mg/dL
Hgb urine dipstick: NEGATIVE
Ketones, ur: 5 mg/dL — AB
Leukocytes,Ua: NEGATIVE
Nitrite: NEGATIVE
Protein, ur: 30 mg/dL — AB
Specific Gravity, Urine: 1.036 — ABNORMAL HIGH (ref 1.005–1.030)
pH: 5 (ref 5.0–8.0)

## 2021-12-24 LAB — BASIC METABOLIC PANEL
Anion gap: 5 (ref 5–15)
BUN: 20 mg/dL (ref 8–23)
CO2: 19 mmol/L — ABNORMAL LOW (ref 22–32)
Calcium: 8.4 mg/dL — ABNORMAL LOW (ref 8.9–10.3)
Chloride: 115 mmol/L — ABNORMAL HIGH (ref 98–111)
Creatinine, Ser: 0.95 mg/dL (ref 0.61–1.24)
GFR, Estimated: >60 mL/min (ref >60)
Glucose, Bld: 112 mg/dL — ABNORMAL HIGH (ref 70–99)
Potassium: 3.5 mmol/L (ref 3.5–5.1)
Sodium: 139 mmol/L (ref 135–145)

## 2021-12-24 LAB — CBC
HCT: 27.7 % — ABNORMAL LOW (ref 39.0–52.0)
Hemoglobin: 8 g/dL — ABNORMAL LOW (ref 13.0–17.0)
MCH: 20.3 pg — ABNORMAL LOW (ref 26.0–34.0)
MCHC: 28.9 g/dL — ABNORMAL LOW (ref 30.0–36.0)
MCV: 70.3 fL — ABNORMAL LOW (ref 80.0–100.0)
Platelets: 638 10*3/uL — ABNORMAL HIGH (ref 150–400)
RBC: 3.94 MIL/uL — ABNORMAL LOW (ref 4.22–5.81)
RDW: 20.8 % — ABNORMAL HIGH (ref 11.5–15.5)
WBC: 13.5 10*3/uL — ABNORMAL HIGH (ref 4.0–10.5)
nRBC: 0 % (ref 0.0–0.2)

## 2021-12-24 LAB — CULTURE, BLOOD (ROUTINE X 2)
Culture: NO GROWTH
Culture: NO GROWTH
Special Requests: ADEQUATE
Special Requests: ADEQUATE

## 2021-12-24 LAB — GLUCOSE, CAPILLARY
Glucose-Capillary: 115 mg/dL — ABNORMAL HIGH (ref 70–99)
Glucose-Capillary: 107 mg/dL — ABNORMAL HIGH (ref 70–99)
Glucose-Capillary: 116 mg/dL — ABNORMAL HIGH (ref 70–99)
Glucose-Capillary: 117 mg/dL — ABNORMAL HIGH (ref 70–99)
Glucose-Capillary: 113 mg/dL — ABNORMAL HIGH (ref 70–99)
Glucose-Capillary: 221 mg/dL — ABNORMAL HIGH (ref 70–99)

## 2021-12-24 LAB — MAGNESIUM: Magnesium: 2.1 mg/dL (ref 1.7–2.4)

## 2021-12-24 SURGERY — SIGMOIDOSCOPY, FLEXIBLE
Anesthesia: General

## 2021-12-24 MED ORDER — PROPOFOL 10 MG/ML IV BOLUS
INTRAVENOUS | Status: DC | PRN
  Administered 2021-12-24: 50 mg via INTRAVENOUS
  Administered 2021-12-24: 30 mg via INTRAVENOUS
  Administered 2021-12-24: 50 mg via INTRAVENOUS

## 2021-12-24 MED ORDER — INSULIN GLARGINE-YFGN 100 UNIT/ML ~~LOC~~ SOLN
10.0000 [IU] | Freq: Every day | SUBCUTANEOUS | Status: DC
  Administered 2021-12-24 – 2021-12-26 (×3): 10 [IU] via SUBCUTANEOUS
  Filled 2021-12-24 (×6): qty 0.1

## 2021-12-24 MED ORDER — LACTATED RINGERS IV SOLN: INTRAVENOUS | Status: DC

## 2021-12-24 MED ORDER — POLYETHYLENE GLYCOL 3350 17 G PO PACK
17.0000 g | PACK | Freq: Three times a day (TID) | ORAL | Status: DC
  Administered 2021-12-24 – 2021-12-26 (×6): 17 g via ORAL
  Filled 2021-12-24 (×7): qty 1

## 2021-12-24 MED ORDER — POTASSIUM CHLORIDE 10 MEQ/100ML IV SOLN
10.0000 meq | INTRAVENOUS | Status: AC
  Administered 2021-12-24 (×4): 10 meq via INTRAVENOUS
  Filled 2021-12-24 (×2): qty 100

## 2021-12-24 NOTE — Brief Op Note (Signed)
12/16/2021 - 12/24/2021  3:48 PM  PATIENT:  Steve Jensen  81 y.o. male  PRE-OPERATIVE DIAGNOSIS:  ileus  POST-OPERATIVE DIAGNOSIS:  Ileus with decompression   PROCEDURE:  Procedure(s): FLEXIBLE SIGMOIDOSCOPY (N/A)  SURGEON:  Surgeon(s) and Role:    * Harvel Quale, MD - Primary  Patient underwent flex sigmoidoscopy under propofol sedation.  Tolerated the procedure adequately.  The perianal and digital rectal examinations were normal. A rectal tube was in place, which was removed to proceed with the endoscopic procedure. A large amount of stool was found in the rectum, in the sigmoid colon, in the descending colon and in the transverse colon, precluding visualization. The colonic lumen was mildly dilated. Suction of the endoluminal gas was performed. No retroflexion was perfomed.  RECOMMENDATIONS - Return patient to hospital ward for ongoing care.  - Full liquid diet.  - Increase Miralax to 3 times a day. - Move the patient from one side to the other every hour if not able to ambulate - Physical therapy daily - Correction of electrolytes, keep potassium more than 4.0, corrected calcium should be more than 9 - Minimize use of opiates.  [name], MD Gastroenterology and Hepatology [facility]

## 2021-12-24 NOTE — Progress Notes (Signed)
Rectal tube balloon 45 cc fluid removed with balloon , Dr. Jenetta Downer removed tube for procedure.

## 2021-12-24 NOTE — Progress Notes (Signed)
OT Cancellation Note  Patient Details Name: Steve Jensen MRN: [id number] DOB: 06/25/1940   Cancelled Treatment:    Reason Eval/Treat Not Completed: Pain limiting ability to participate. Pt refused mobility due to pain in B LE with assisted movement. Pt educated on need to work with therapy to ensure family is able to handle pt's level of assist. Pt continued to refuse treatment. Will attempt to see pt later as time permits.   Abem Shaddix OT, MOT   Larey Seat 12/24/2021, 9:25 AM

## 2021-12-24 NOTE — Anesthesia Preprocedure Evaluation (Signed)
Anesthesia Evaluation  Patient identified by MRN, date of birth, ID band Patient awake    Reviewed: Allergy & Precautions, H&P , NPO status , Patient's Chart, lab work & pertinent test results, reviewed documented beta blocker date and time   Airway Mallampati: II  TM Distance: >3 FB Neck ROM: full    Dental no notable dental hx.    Pulmonary sleep apnea , former smoker,    Pulmonary exam normal breath sounds clear to auscultation       Cardiovascular Exercise Tolerance: Good hypertension, negative cardio ROS   Rhythm:regular Rate:Normal     Neuro/Psych  Neuromuscular disease negative psych ROS   GI/Hepatic Neg liver ROS, GERD  Medicated,  Endo/Other  negative endocrine ROSdiabetes, Type 2  Renal/GU negative Renal ROS  negative genitourinary   Musculoskeletal   Abdominal   Peds  Hematology  (+) Blood dyscrasia, anemia ,   Anesthesia Other Findings   Reproductive/Obstetrics negative OB ROS                             Anesthesia Physical Anesthesia Plan  ASA: 4 and emergent  Anesthesia Plan: General   Post-op Pain Management:    Induction:   PONV Risk Score and Plan: Propofol infusion  Airway Management Planned:   Additional Equipment:   Intra-op Plan:   Post-operative Plan:   Informed Consent: I have reviewed the patients History and Physical, chart, labs and discussed the procedure including the risks, benefits and alternatives for the proposed anesthesia with the patient or authorized representative who has indicated his/her understanding and acceptance.     Dental Advisory Given  Plan Discussed with: CRNA  Anesthesia Plan Comments:         Anesthesia Quick Evaluation

## 2021-12-24 NOTE — Anesthesia Postprocedure Evaluation (Signed)
Anesthesia Post Note  Patient: Steve Jensen  Procedure(s) Performed: [place]  Patient location during evaluation: PACU Anesthesia Type: General Level of consciousness: awake and alert Pain management: pain level controlled Vital Signs Assessment: post-procedure vital signs reviewed and stable Respiratory status: spontaneous breathing Cardiovascular status: stable Postop Assessment: no apparent nausea or vomiting Anesthetic complications: no   No notable events documented.   Last Vitals:  Vitals:   12/24/21 1410 12/24/21 1552  BP: 136/65 (!) 145/68  Pulse: 80 76  Resp: 18 19  Temp:  36.7 C  SpO2: 96% 92%    Last Pain:  Vitals:   12/24/21 1410  TempSrc:   PainSc: 0-No pain                 Maxton Noreen

## 2021-12-24 NOTE — Transfer of Care (Signed)
Immediate Anesthesia Transfer of Care Note  Patient: Steve Jensen  Procedure(s) Performed: FLEXIBLE SIGMOIDOSCOPY  Patient Location: PACU  Anesthesia Type:General  Level of Consciousness: awake  Airway & Oxygen Therapy: Patient Spontanous Breathing  Post-op Assessment: Report given to RN  Post vital signs: Reviewed and stable  Last Vitals:  Vitals Value Taken Time  BP 145/68 12/24/21 1552  Temp 36.7 C 12/24/21 1552  Pulse 78 12/24/21 1552  Resp 19 12/24/21 1552  SpO2 92 % 12/24/21 1552  Vitals shown include unvalidated device data.  Last Pain:  Vitals:   12/24/21 1410  TempSrc:   PainSc: 0-No pain      Patients Stated Pain Goal: 5 (01/65/53 7482)  Complications: No notable events documented.

## 2021-12-24 NOTE — Consult Note (Signed)
[name]  Reason for Consult: Dilated loop of small bowel, evaluate for internal hernia Referring Physician: Aliene Altes, PA-C  Chief Complaint   Knee Pain     HPI: Steve Jensen is a 81 y.o. male who was admitted to the hospital on 7/24 for worsening shortness of breath and pneumonia.  He was noted to have developed an ileus, for which GI has been following.  He underwent a CT abdomen and pelvis with IV contrast yesterday which demonstrated mildly dilated left abdominal small bowel with air-fluid levels, possibly localized ileus versus early obstruction due to internal hernia, no ischemic changes visualized; stool-filled and gaseous distention of the transverse colon, likely ileus.  GI consult to general surgery for evaluation of the small bowel loop that was noted to be dilated in the left abdomen.  Since being in the hospital, the patient denies nausea, vomiting, and abdominal pain.  He believes he was passing flatus yesterday.  He has a rectal tube in place, with 500 cc of liquid stool in the bag.  He does complain that he has not eaten anything in 3 days, and he would like a diet.  He denies any history of abdominal surgeries.  He does history of prostate surgery.  He is normally on Eliquis for his A-fib, though he is currently on full dose Lovenox in the hospital.  Past Medical History:  Diagnosis Date   Anemia    Diabetes mellitus type II, controlled ([place])    with neuropathy   Dysrhythmia    A-Fib. cardioversion done   GERD (gastroesophageal reflux disease)    Hyperlipidemia    Hypertension    Morbid obesity ([place])    Neuromuscular disorder ([place])    feet   OSA (obstructive sleep apnea)    On BiPAP at 15/11cm H2O   Prostate cancer ([place])    Shingles    on face Nov 2010   Urinary incontinence     Past Surgical History:  Procedure Laterality Date   CARDIOVERSION N/A 09/06/2013   Procedure: CARDIOVERSION;  Surgeon: [name], MD;   Location: [facility];  Service: Cardiovascular;  Laterality: N/A;   EYE SURGERY     cataract surgery bilat; surgery to correct droopy eyelids    IR ANGIOGRAM SELECTIVE EACH ADDITIONAL VESSEL  02/15/2018   IR ANGIOGRAM SELECTIVE EACH ADDITIONAL VESSEL  02/15/2018   IR ANGIOGRAM SELECTIVE EACH ADDITIONAL VESSEL  02/15/2018   IR ANGIOGRAM SELECTIVE EACH ADDITIONAL VESSEL  02/15/2018   IR ANGIOGRAM SELECTIVE EACH ADDITIONAL VESSEL  02/15/2018   IR ANGIOGRAM VISCERAL SELECTIVE  02/15/2018   IR ANGIOGRAM VISCERAL SELECTIVE  02/15/2018   IR EMBO TUMOR ORGAN ISCHEMIA INFARCT INC GUIDE ROADMAPPING  02/15/2018   IR RADIOLOGIST EVAL & MGMT  01/26/2018   IR RADIOLOGIST EVAL & MGMT  03/17/2018   IR RADIOLOGIST EVAL & MGMT  06/29/2018   IR RADIOLOGIST EVAL & MGMT  07/27/2018   IR RADIOLOGIST EVAL & MGMT  10/28/2018   IR RADIOLOGIST EVAL & MGMT  12/14/2018   IR RADIOLOGIST EVAL & MGMT  03/17/2019   IR RADIOLOGIST EVAL & MGMT  07/14/2019   IR RADIOLOGIST EVAL & MGMT  12/27/2019   IR RADIOLOGIST EVAL & MGMT  08/21/2020   IR RADIOLOGIST EVAL & MGMT  02/28/2021   IR RADIOLOGIST EVAL & MGMT  06/25/2021   IR RADIOLOGIST EVAL & MGMT  10/18/2021   IR US GUIDE VASC ACCESS RIGHT  02/15/2018   knee surgery bilat  PROSTATE SURGERY     RADIOLOGY WITH ANESTHESIA N/A 11/24/2018   Procedure: MICROWAVE THERMAL ABLATION LIVER;  Surgeon: Sandi Mariscal, MD;  Location: [facility];  Service: Anesthesiology;  Laterality: N/A;   right shoulder rotator cuff surgery     TONSILLECTOMY      Family History  Problem Relation Age of Onset   Hypertension Brother    Diabetes Brother    Cancer Brother        bladder cancer   Diabetes Brother    CVA Brother    Hypertension Brother    Prostate cancer Brother    Diabetes Brother    Hypertension Brother    Heart disease Brother        CABG   Heart attack Brother    Stroke Brother     Social History   Tobacco Use   Smoking status: Former    Packs/day: 1.00    Years: 15.00    Total pack  years: 15.00    Types: Cigarettes    Quit date: 05/27/1975    Years since quitting: 46.6   Smokeless tobacco: Never  Vaping Use   Vaping Use: Never used  Substance Use Topics   Alcohol use: No   Drug use: No    Medications: I have reviewed the patient's current medications.  Allergies  Allergen Reactions   Lipitor [Atorvastatin] Other (See Comments)    Muscle aches   Simvastatin Other (See Comments)    Muscle aches   Zithromax [Azithromycin] Other (See Comments)    GI-SIDE EFFECTS   Amlodipine Other (See Comments)     ROS:  Constitutional: negative for chills, fatigue, and fevers Respiratory: positive for shortness of breath Cardiovascular: negative for chest pain Gastrointestinal: negative for abdominal pain, nausea, reflux symptoms, and vomiting Musculoskeletal:positive for bilateral knee pain  Blood pressure (!) 134/51, pulse 72, temperature 97.9 F (36.6 C), temperature source Oral, resp. rate 20, height '5\' 10"'$  (1.778 m), weight 131.9 kg, SpO2 96 %. Physical Exam Vitals reviewed.  Constitutional:      Appearance: Normal appearance.  HENT:     Head: Normocephalic and atraumatic.  Eyes:     Extraocular Movements: Extraocular movements intact.     Pupils: Pupils are equal, round, and reactive to light.  Cardiovascular:     Rate and Rhythm: Normal rate.  Pulmonary:     Effort: Pulmonary effort is normal.  Abdominal:     Comments: Abdomen soft, moderate distention in the upper abdomen, no percussion tenderness, nontender to palpation; no rigidity, guarding, rebound tenderness; no cicatrix on abdomen noted  Musculoskeletal:        General: Normal range of motion.     Cervical back: Normal range of motion.  Skin:    General: Skin is warm and dry.  Neurological:     General: No focal deficit present.     Mental Status: He is alert and oriented to person, place, and time.  Psychiatric:        Mood and Affect: Mood normal.        Behavior: Behavior normal.      Results: Results for orders placed or performed during the hospital encounter of 12/16/21 (from the past 48 hour(s))  Glucose, capillary     Status: Abnormal   Collection Time: 12/22/21 11:29 AM  Result Value Ref Range   Glucose-Capillary 252 (H) 70 - 99 mg/dL    Comment: Glucose reference range applies only to samples taken after fasting for at least 8 hours.  Glucose,  capillary     Status: Abnormal   Collection Time: 12/22/21  4:23 PM  Result Value Ref Range   Glucose-Capillary 172 (H) 70 - 99 mg/dL    Comment: Glucose reference range applies only to samples taken after fasting for at least 8 hours.  Glucose, capillary     Status: Abnormal   Collection Time: 12/22/21  8:48 PM  Result Value Ref Range   Glucose-Capillary 177 (H) 70 - 99 mg/dL    Comment: Glucose reference range applies only to samples taken after fasting for at least 8 hours.   Comment 1 Notify RN    Comment 2 Document in Chart   CBC     Status: Abnormal   Collection Time: 12/23/21  6:04 AM  Result Value Ref Range   WBC 16.8 (H) 4.0 - 10.5 K/uL   RBC 3.99 (L) 4.22 - 5.81 MIL/uL   Hemoglobin 8.2 (L) 13.0 - 17.0 g/dL    Comment: Reticulocyte Hemoglobin testing may be clinically indicated, consider ordering this additional test [id number]    HCT 28.1 (L) 39.0 - 52.0 %   MCV 70.4 (L) 80.0 - 100.0 fL   MCH 20.6 (L) 26.0 - 34.0 pg   MCHC 29.2 (L) 30.0 - 36.0 g/dL   RDW 20.5 (H) 11.5 - 15.5 %   Platelets 662 (H) 150 - 400 K/uL   nRBC 0.2 0.0 - 0.2 %    Comment: Performed at [facility], [address], [place], [place] [address]  Basic metabolic panel     Status: Abnormal   Collection Time: 12/23/21  6:04 AM  Result Value Ref Range   Sodium 139 135 - 145 mmol/L   Potassium 3.5 3.5 - 5.1 mmol/L   Chloride 114 (H) 98 - 111 mmol/L   CO2 20 (L) 22 - 32 mmol/L   Glucose, Bld 153 (H) 70 - 99 mg/dL    Comment: Glucose reference range applies only to samples taken after fasting for at least 8 hours.   BUN 28 (H)  8 - 23 mg/dL   Creatinine, Ser 1.08 0.61 - 1.24 mg/dL   Calcium 8.6 (L) 8.9 - 10.3 mg/dL   GFR, Estimated >60 >60 mL/min    Comment: (NOTE) Calculated using the CKD-EPI Creatinine Equation (2021)    Anion gap 5 5 - 15    Comment: Performed at [facility], [address], [place], [place] [address]  Magnesium     Status: None   Collection Time: 12/23/21  6:04 AM  Result Value Ref Range   Magnesium 2.2 1.7 - 2.4 mg/dL    Comment: Performed at [facility], [address], [place], [place] [address]  Glucose, capillary     Status: Abnormal   Collection Time: 12/23/21  7:38 AM  Result Value Ref Range   Glucose-Capillary 154 (H) 70 - 99 mg/dL    Comment: Glucose reference range applies only to samples taken after fasting for at least 8 hours.  Glucose, capillary     Status: Abnormal   Collection Time: 12/23/21 12:00 PM  Result Value Ref Range   Glucose-Capillary 144 (H) 70 - 99 mg/dL    Comment: Glucose reference range applies only to samples taken after fasting for at least 8 hours.  Glucose, capillary     Status: Abnormal   Collection Time: 12/23/21  4:12 PM  Result Value Ref Range   Glucose-Capillary 129 (H) 70 - 99 mg/dL    Comment: Glucose reference range applies only to samples taken after  fasting for at least 8 hours.  Glucose, capillary     Status: Abnormal   Collection Time: 12/23/21  8:18 PM  Result Value Ref Range   Glucose-Capillary 107 (H) 70 - 99 mg/dL    Comment: Glucose reference range applies only to samples taken after fasting for at least 8 hours.  Glucose, capillary     Status: Abnormal   Collection Time: 12/24/21  1:02 AM  Result Value Ref Range   Glucose-Capillary 115 (H) 70 - 99 mg/dL    Comment: Glucose reference range applies only to samples taken after fasting for at least 8 hours.  Urinalysis, Routine w reflex microscopic Urine, Clean Catch     Status: Abnormal   Collection Time: 12/24/21  4:44 AM  Result Value Ref Range   Color, Urine YELLOW YELLOW    APPearance CLOUDY (A) CLEAR   Specific Gravity, Urine 1.036 (H) 1.005 - 1.030   pH 5.0 5.0 - 8.0   Glucose, UA NEGATIVE NEGATIVE mg/dL   Hgb urine dipstick NEGATIVE NEGATIVE   Bilirubin Urine NEGATIVE NEGATIVE   Ketones, ur 5 (A) NEGATIVE mg/dL   Protein, ur 30 (A) NEGATIVE mg/dL   Nitrite NEGATIVE NEGATIVE   Leukocytes,Ua NEGATIVE NEGATIVE   RBC / HPF 0-5 0 - 5 RBC/hpf   WBC, UA 0-5 0 - 5 WBC/hpf   Bacteria, UA NONE SEEN NONE SEEN   Squamous Epithelial / LPF 0-5 0 - 5   Hyaline Casts, UA PRESENT    Uric Acid Crys, UA PRESENT     Comment: Performed at [facility], [address] [place], [place] [address]  Basic metabolic panel     Status: Abnormal   Collection Time: 12/24/21  5:37 AM  Result Value Ref Range   Sodium 139 135 - 145 mmol/L   Potassium 3.5 3.5 - 5.1 mmol/L   Chloride 115 (H) 98 - 111 mmol/L   CO2 19 (L) 22 - 32 mmol/L   Glucose, Bld 112 (H) 70 - 99 mg/dL    Comment: Glucose reference range applies only to samples taken after fasting for at least 8 hours.   BUN 20 8 - 23 mg/dL   Creatinine, Ser 0.95 0.61 - 1.24 mg/dL   Calcium 8.4 (L) 8.9 - 10.3 mg/dL   GFR, Estimated >60 >60 mL/min    Comment: (NOTE) Calculated using the CKD-EPI Creatinine Equation (2021)    Anion gap 5 5 - 15    Comment: Performed at [facility], [address] [place], [place] [address]  Magnesium     Status: None   Collection Time: 12/24/21  5:37 AM  Result Value Ref Range   Magnesium 2.1 1.7 - 2.4 mg/dL    Comment: Performed at [facility], [address], [place], [place] [address]  CBC     Status: Abnormal   Collection Time: 12/24/21  5:37 AM  Result Value Ref Range   WBC 13.5 (H) 4.0 - 10.5 K/uL   RBC 3.94 (L) 4.22 - 5.81 MIL/uL   Hemoglobin 8.0 (L) 13.0 - 17.0 g/dL    Comment: Reticulocyte Hemoglobin testing may be clinically indicated, consider ordering this additional test [id number]    HCT 27.7 (L) 39.0 - 52.0 %   MCV 70.3 (L) 80.0 - 100.0 fL   MCH 20.3 (L) 26.0 - 34.0 pg    MCHC 28.9 (L) 30.0 - 36.0 g/dL   RDW 20.8 (H) 11.5 - 15.5 %   Platelets 638 (H) 150 - 400 K/uL   nRBC  0.0 0.0 - 0.2 %    Comment: Performed at [facility], [address] [place], [place] [address]  Glucose, capillary     Status: Abnormal   Collection Time: 12/24/21  7:09 AM  Result Value Ref Range   Glucose-Capillary 107 (H) 70 - 99 mg/dL    Comment: Glucose reference range applies only to samples taken after fasting for at least 8 hours.    CT ABDOMEN PELVIS W CONTRAST  Result Date: 12/23/2021 CLINICAL DATA:  Bowel obstruction suspected. Abdominal distention, abdominal pain, and bloating. EXAM: CT ABDOMEN AND PELVIS WITH CONTRAST TECHNIQUE: Multidetector CT imaging of the abdomen and pelvis was performed using the standard protocol following bolus administration of intravenous contrast. RADIATION DOSE REDUCTION: This exam was performed according to the departmental dose-optimization program which includes automated exposure control, adjustment of the mA and/or kV according to patient size and/or use of iterative reconstruction technique. CONTRAST:  145m OMNIPAQUE IOHEXOL 300 MG/ML  SOLN COMPARISON:  10/15/2021 FINDINGS: Lower chest: Consolidation in the lung bases, greater on the left. Minimal pleural effusions. Changes could represent compressive atelectasis or multifocal pneumonia. Small esophageal hiatal hernia. Hepatobiliary: Cholelithiasis with small stones in the gallbladder. No gallbladder wall thickening or inflammation. No bile duct dilatation. Focal low-attenuation lesion in segment 6 of the liver measuring 4.7 cm maximal diameter. This is unchanged since previous study. This has been evaluated on previous imaging including MRI 06/19/2021 where it was reported as consistent with post treatment ablation cavity. Pancreas: Scattered calcifications in the pancreas suggesting chronic pancreatitis. No acute inflammatory changes. Spleen: Normal in size without focal abnormality.  Adrenals/Urinary Tract: Right adrenal gland nodule, 2.6 cm diameter, 31 Hounsfield units on contrast enhanced examination. No change since previous studies dating back to 02/01/2009. Long-term stability is consistent with benign etiology and no additional imaging follow-up is indicated. Kidneys, ureters, and the bladder are unremarkable. Stomach/Bowel: Stomach is decompressed. Majority of the small bowel are decompressed but there are left lower quadrant and left mid abdominal jejunal loops which are distended with air-fluid level. Suggestion of mild wall thickening. Changes could represent early phase of obstruction due to internal hernia. No pneumatosis or mesenteric stranding is noted. The colon is distended with gas and stool, most prominent in the transverse colon. This is likely ileus. A rectal catheter is present with inflated balloon. Appendix is not identified. Vascular/Lymphatic: Aortic atherosclerosis. No enlarged abdominal or pelvic lymph nodes. Reproductive: Prostate gland is surgically absent. Other: No free air or free fluid in the abdomen. Abdominal wall musculature appears intact. Musculoskeletal: Degenerative changes.  No acute bony abnormalities. IMPRESSION: 1. Consolidation in the lung bases, greater on the left, possibly pneumonia or compressive atelectasis. 2. Mildly dilated left abdominal small bowel with air-fluid levels. Proximal and distal small bowel are decompressed. Changes could represent localized ileus versus early obstruction due to internal hernia. No ischemic changes are visualized. 3. Stool-filled and gaseous distention mostly of the transverse colon, likely ileus. 4. Stable appearance of liver segment 6 ablation cavity. 5. 2.6 cm right adrenal gland nodule is stable since 02/01/2009 suggesting benign etiology. No additional imaging follow-up is indicated. 6. Aortic atherosclerosis. Electronically Signed   By: WLucienne CapersM.D.   On: 12/23/2021 19:57   DG Abd 1  View  Result Date: 12/23/2021 CLINICAL DATA:  Increasing shortness of breath. Sepsis. Multiple underlying medical problems. EXAM: ABDOMEN - 1 VIEW COMPARISON:  Radiographs 12/21/2021 and 12/19/2021.  CT 10/15/2021. FINDINGS: 0527 hours. Four supine views of the abdomen are submitted. Diffuse dilatation  of the small and large bowel is again noted, without significant change. No supine evidence of free intraperitoneal air. Postsurgical changes are noted within the pelvis. There are no suspicious abdominal calcifications. IMPRESSION: No significant change in mild diffuse bowel distension, most consistent with an ileus. No new findings. Electronically Signed   By: Richardean Sale M.D.   On: 12/23/2021 08:12     Assessment & Plan:  Steve Jensen is a 81 y.o. male who was admitted with shortness of breath and pneumonia.  GI consulted for ileus.  CT abdomen and pelvis demonstrating distended transverse colon, and small bowel loop in left abdomen mildly dilated with decompressed proximal and distal small bowel, ileus versus early obstruction from internal hernia.  WBC 13.5.  Imaging and blood work evaluated by myself.  -I reviewed the CT abdomen and pelvis with Dr. Thornton Papas.  While we see this minimally dilated loop of small bowel in the left abdomen, there are no mesenteric/blood vessel swirling and no ischemic changes.  His transverse colon is distended without any evidence of obstruction -Given his benign abdominal exam, improving blood work, and imaging findings, we will hold off on any surgical intervention at this time -Recommend potential decompressive colonoscopy by GI given significant transverse colon distention -If patient continues to fail to progress or clinically worsens, could consider repeat CT abdomen and pelvis to evaluate small bowel versus Gastrografin study to evaluate for obstruction -Defer diet to GI -Appreciate GI recommendations -Care per primary team -General surgery will sign off at  this time.  Please call with any further questions or concerns  All questions were answered to the satisfaction of the patient.  -- Graciella Freer, DO [facility] [address] [place], [place] [address] [phone] (office)

## 2021-12-24 NOTE — Progress Notes (Addendum)
Subjective: Reports he feels well this morning. Thinks his abdomen may be a little less distended. Denies abdominal pain, nausea, vomiting. Still has rectal tube in place with 400 mL of brown liquid stool.   Has not been up walking. States he needs a walker. Reports he has been trying to roll from one side to the other in the bed.   Rectal tube in place with just slightly over 500 mL of brown liquid stool in the bag.  Spoke with nursing staff who states this is the same bag that was in place yesterday.  He had 500 mL of output at 7 AM yesterday and has had little to no output since that time.   Na 139, K 3.5, magnesium 2.1.   CT A/P with IV contrast only showed mildly dilated left abdominal small bowel with air-fluid levels, proximal and distal small bowel are decompressed, changes could represent localized ileus versus early obstruction due to internal hernia.  No ischemic changes visualized.  Also with stool-filled and gaseous distention most of the transverse colon, likely ileus.  Objective: Vital signs in last 24 hours: Temp:  [97.9 F (36.6 C)-98.4 F (36.9 C)] 97.9 F (36.6 C) (08/01 0502) Pulse Rate:  [72-77] 72 (08/01 0502) Resp:  [20-22] 20 (07/31 2021) BP: (130-134)/(51-66) 134/51 (08/01 0502) SpO2:  [94 %-97 %] 94 % (08/01 0502) Last BM Date : 12/23/21 General:   Alert and oriented, pleasant, NAD Head:  Normocephalic and atraumatic. Eyes:  No icterus, sclera clear. Conjuctiva pink.  Lungs: Scattered expiratory wheezing throughout.  Abdomen:  Bowel sounds present but hypoactive and high pitched across upper abdomen. Upper abdomen is distended but fairly soft, lower abdomen is flat. No tenderness to palpation. No rebound or guarding. No masses appreciated  Msk:  Symmetrical without gross deformities. Normal posture. Extremities:  With 2+ pitting edema in LE bilaterally. Neurologic:  Alert and  oriented x3;  grossly normal neurologically. Skin:  Warm and dry, intact  without significant lesions.  Psych: Normal mood and affect.  Intake/Output from previous day: 07/31 0701 - 08/01 0700 In: 115 [IV Piggyback:115] Out: 1200 [Urine:1200] Intake/Output this shift: No intake/output data recorded.  Lab Results: Recent Labs    12/22/21 0550 12/23/21 0604 12/24/21 0537  WBC 16.9* 16.8* 13.5*  HGB 7.7* 8.2* 8.0*  HCT 25.8* 28.1* 27.7*  PLT 755* 662* 638*   BMET Recent Labs    12/22/21 0550 12/23/21 0604 12/24/21 0537  NA 138 139 139  K 3.3* 3.5 3.5  CL 111 114* 115*  CO2 21* 20* 19*  GLUCOSE 156* 153* 112*  BUN 41* 28* 20  CREATININE 1.19 1.08 0.95  CALCIUM 8.2* 8.6* 8.4*     Studies/Results: CT ABDOMEN PELVIS W CONTRAST  Result Date: 12/23/2021 CLINICAL DATA:  Bowel obstruction suspected. Abdominal distention, abdominal pain, and bloating. EXAM: CT ABDOMEN AND PELVIS WITH CONTRAST TECHNIQUE: Multidetector CT imaging of the abdomen and pelvis was performed using the standard protocol following bolus administration of intravenous contrast. RADIATION DOSE REDUCTION: This exam was performed according to the departmental dose-optimization program which includes automated exposure control, adjustment of the mA and/or kV according to patient size and/or use of iterative reconstruction technique. CONTRAST:  127m OMNIPAQUE IOHEXOL 300 MG/ML  SOLN COMPARISON:  10/15/2021 FINDINGS: Lower chest: Consolidation in the lung bases, greater on the left. Minimal pleural effusions. Changes could represent compressive atelectasis or multifocal pneumonia. Small esophageal hiatal hernia. Hepatobiliary: Cholelithiasis with small stones in the gallbladder. No gallbladder wall thickening or  inflammation. No bile duct dilatation. Focal low-attenuation lesion in segment 6 of the liver measuring 4.7 cm maximal diameter. This is unchanged since previous study. This has been evaluated on previous imaging including MRI 06/19/2021 where it was reported as consistent with post  treatment ablation cavity. Pancreas: Scattered calcifications in the pancreas suggesting chronic pancreatitis. No acute inflammatory changes. Spleen: Normal in size without focal abnormality. Adrenals/Urinary Tract: Right adrenal gland nodule, 2.6 cm diameter, 31 Hounsfield units on contrast enhanced examination. No change since previous studies dating back to 02/01/2009. Long-term stability is consistent with benign etiology and no additional imaging follow-up is indicated. Kidneys, ureters, and the bladder are unremarkable. Stomach/Bowel: Stomach is decompressed. Majority of the small bowel are decompressed but there are left lower quadrant and left mid abdominal jejunal loops which are distended with air-fluid level. Suggestion of mild wall thickening. Changes could represent early phase of obstruction due to internal hernia. No pneumatosis or mesenteric stranding is noted. The colon is distended with gas and stool, most prominent in the transverse colon. This is likely ileus. A rectal catheter is present with inflated balloon. Appendix is not identified. Vascular/Lymphatic: Aortic atherosclerosis. No enlarged abdominal or pelvic lymph nodes. Reproductive: Prostate gland is surgically absent. Other: No free air or free fluid in the abdomen. Abdominal wall musculature appears intact. Musculoskeletal: Degenerative changes.  No acute bony abnormalities. IMPRESSION: 1. Consolidation in the lung bases, greater on the left, possibly pneumonia or compressive atelectasis. 2. Mildly dilated left abdominal small bowel with air-fluid levels. Proximal and distal small bowel are decompressed. Changes could represent localized ileus versus early obstruction due to internal hernia. No ischemic changes are visualized. 3. Stool-filled and gaseous distention mostly of the transverse colon, likely ileus. 4. Stable appearance of liver segment 6 ablation cavity. 5. 2.6 cm right adrenal gland nodule is stable since 02/01/2009  suggesting benign etiology. No additional imaging follow-up is indicated. 6. Aortic atherosclerosis. Electronically Signed   By: Lucienne Capers M.D.   On: 12/23/2021 19:57   DG Abd 1 View  Result Date: 12/23/2021 CLINICAL DATA:  Increasing shortness of breath. Sepsis. Multiple underlying medical problems. EXAM: ABDOMEN - 1 VIEW COMPARISON:  Radiographs 12/21/2021 and 12/19/2021.  CT 10/15/2021. FINDINGS: 0527 hours. Four supine views of the abdomen are submitted. Diffuse dilatation of the small and large bowel is again noted, without significant change. No supine evidence of free intraperitoneal air. Postsurgical changes are noted within the pelvis. There are no suspicious abdominal calcifications. IMPRESSION: No significant change in mild diffuse bowel distension, most consistent with an ileus. No new findings. Electronically Signed   By: Richardean Sale M.D.   On: 12/23/2021 08:12    Assessment: 81 y.o. male with a history of diabetes, GERD, HLD, HTN, morbid obesity, OSA, neuropathy, prostate cancer who was admitted to the hospital on 7/24 after presenting worsening shortness of breath.  He was diagnosed with severe sepsis due to pneumonia for which she was started on IV antibiotics and oxygen supplementation.  GI consulted for evaluation of ileus.   Ileus: New onset ileus in the setting of severe sepsis. He did have some recent narcotic exposure which likely aggravated his current condition. He has had rectal tube in place, is receiving MiraLAX BID, and electrolyte replacement in efforts to treat conservatively.  Electrolytes are improving with Na 139, K 3.5, magnesium 2.1 today. Total of 500 ml stool output recorded yesterday morning around 7 am, but has had little to no output since that time.  CT A/P with  IV contrast completed yesterday due to abdominal x-ray with persistent diffuse small and large bowel dilation and showed  mildly dilated left abdominal small bowel with air-fluid levels, proximal  and distal small bowel are decompressed, changes could represent localized ileus versus early obstruction due to internal hernia.  No ischemic changes visualized.  Also with stool-filled and gaseous distention most of the transverse colon, likely ileus. Clinically, patient reports feeling his abdomen is somewhat less distended today though on exam, he continues with upper abdominal distension and hypoactive, high pitched bowel sounds, lower abdomen is flat. He denies abdominal pain, nausea, or vomiting though he has been NPO.   Reviewed with general surgery who reviewed imaging and doesn't feel that patient has any significant internal hernia.  Recommended monitoring and consider decompressive colonoscopy for dilated transverse colon.  Overall, suspected Ogilvie type picture.  Stated he did not continue to improve, could consider reimaging with CT again to reevaluate small bowel loops or get a Gastrografin protocol to make sure the area is open/nonobstructive.  Discussed with Dr. Jenetta Downer.  We will proceed with decompressive colonoscopy today. I spoke with patient's daughter in law at his request and updated her on the plan.   Plan: Proceed with decompressive colonoscopy with Dr. Jenetta Downer today. We will hold morning dose of Lovenox. Try to maintain potassium > 4, corrected calcium should be > 9. Minimize use of narcotics.    LOS: 8 days    12/24/2021, 7:21 AM   Aliene Altes, PA-C [facility]

## 2021-12-24 NOTE — Op Note (Signed)
[facility] Patient Name: Steve Jensen Procedure Date: 12/24/2021 3:01 PM MRN: [id number] Date of Birth: [birth year] Attending MD: [name] ,  CSN: [id number] Age: 81 Admit Type: Inpatient Procedure:                Flexible Sigmoidoscopy Indications:              Abnormal CT of the GI tract, ileus - for endoscopic                            decompression Providers:                [name], Lambert Mody, Kristine L.                            Risa Grill, Technician Referring MD:              Medicines:                Monitored Anesthesia Care Complications:            No immediate complications. Estimated Blood Loss:     Estimated blood loss: none. Procedure:                Pre-Anesthesia Assessment:                           - Prior to the procedure, a History and Physical                            was performed, and patient medications, allergies                            and sensitivities were reviewed. The patient's                            tolerance of previous anesthesia was reviewed.                           - The risks and benefits of the procedure and the                            sedation options and risks were discussed with the                            patient. All questions were answered and informed                            consent was obtained.                           - ASA Grade Assessment: III - A patient with severe                            systemic disease.                           After obtaining informed consent, the scope was  passed under direct vision. The [id number]-[id number] ([id number])                            scope was introduced through the anus and advanced                            to the the left transverse colon. The flexible                            sigmoidoscopy was accomplished without difficulty.                            The patient tolerated the procedure well. Scope In: 3:34:49 PM Scope  Out: 3:40:33 PM Total Procedure Duration: 0 hours 5 minutes 44 seconds  Findings:      The perianal and digital rectal examinations were normal. A rectal tube       was in place, which was removed to proceed with the endoscopic procedure.      A large amount of stool was found in the rectum, in the sigmoid colon,       in the descending colon and in the transverse colon, precluding       visualization. The colonic lumen was mildly dilated. Suction of the       endoluminal gas was performed.      No retroflexion was perfomed. Impression:               - Stool in the rectum, in the sigmoid colon, in the                            descending colon and in the transverse colon.                           - No specimens collected. Moderate Sedation:      Per Anesthesia Care Recommendation:           - Return patient to hospital ward for ongoing care.                           - Full liquid diet.                           - Increase Miralax to 3 times a day.                           - Move the patient fromoneside to the other every                            hour if not able to ambulate                           - Physical therapy daily                           - Correction of electrolytes, keep potassium more  than 4.0,correctedcalcium should be more than 9                           - Minimize use of opiates. Procedure Code(s):        --- Professional ---                           [phone], Sigmoidoscopy, flexible; diagnostic,                            including collection of specimen(s) by brushing or                            washing, when performed (separate procedure) Diagnosis Code(s):        --- Professional ---                           R93.3, Abnormal findings on diagnostic imaging of                            other parts of digestive tract CPT copyright 2019 [employer]. All rights reserved. The codes documented in this report are  preliminary and upon coder review may  be revised to meet current compliance requirements. [name], MD [name],  12/24/2021 3:51:20 PM This report has been signed electronically. Number of Addenda: 0

## 2021-12-24 NOTE — Progress Notes (Signed)
PROGRESS NOTE    IRVINE GLORIOSO  ZMC:[id number] DOB: 1941/01/08 DOA: 12/16/2021 PCP: [name], MD   Brief Narrative:    Steve Jensen  is a 81 y.o. male reformed smoker with past medical history relevant for morbid obesity/OSA, chronic A-fib status post prior cardioversion chronically on Eliquis for stroke prophylaxis, HTN, chronic diastolic dysfunction CHF and advanced osteoarthritis as well as diabetes mellitus who presents to the ED with complaints of productive cough with colored sputum, shortness of breath and dyspnea on exertion since 12/13/2021.  Patient was admitted for severe sepsis secondary to community-acquired pneumonia, present on admission and he has now completed IV antibiotic course.  He is also noted to have associated acute hypoxemic respiratory failure.  He now has ileus for which rectal tube has been placed.  Pulmonology consulted due to ongoing hypoxemia and poor progression.  GI consulted due to ongoing ileus and he currently remains n.p.o.  Assessment & Plan:   Principal Problem:   Pneumonia Active Problems:   Sepsis due to CAP/PNA   Essential hypertension, benign   Obstructive sleep apnea   Morbid obesity (HCC)   Atrial fibrillation ([place])   Uncontrolled type 2 diabetes mellitus with hyperglycemia, without long-term current use of insulin (HCC)   Osteoarthritis of right knee  Assessment and Plan:   Severe sepsis, present on admission, due to CAP/PNA-ongoing - Patient met criteria for severe sepsis due to pneumonia at time of admission; febrile, elevated WBCs, tachycardic/tachypneic, source of infection left lower lobe pneumonia and organ dysfunction his lungs with the presence of hypoxia requiring oxygen supplementation. -As mentioned above continue maintaining adequate hydration, IV antibiotics and further treatment of his pneumonia. -Repeat blood cultures with fever overnight and check urine cultures and chest x-ray -MRSA PCR negative -Appreciate  pulmonology evaluation, now signed off -Completed 7-day course of IV antibiotics 7/30   Acute hypoxemic respiratory failure secondary to above -Currently on 4 L nasal cannula oxygen, continue to wean as tolerated -Chest x-ray 7/27 with atelectasis -Incentive spirometry -Up in chair with ambulation   Ileus -Started rectal tube 7/27 -Patient agreeable to mobilize more with OT/PT and this is being encouraged -Reglan ordered per PCCM, now discontinued -Appreciate ongoing GI evaluation   Osteoarthritis of right knee - Patient complaining of significant pain -Voltaren gel and as needed tramadol will be added to assist with discomfort -Outpatient follow-up with orthopedic service as he has been advised of the need for knee replacement.   Uncontrolled type 2 diabetes mellitus with hyperglycemia, without long-term current use of insulin (HCC) with hyperglycemia -A1c 7.6  -Continue sliding scale insulin; NovoLog for meal coverage added. -Follow CBGs fluctuation and adjust hypoglycemic regimen as required -While inpatient continue holding oral hypoglycemic agents. -Semglee to 10 units daily 8/1 given n.p.o. status   Atrial fibrillation (HCC) - Paroxysmal in nature -Continue telemetry monitoring -Rate and rhythm stable at this time -Continue metoprolol and Cardizem for rate control -Continue on full dose Lovenox for now given ileus and possible need for inpatient procedure   Morbid obesity ([place]) -Body mass index is 41.72 kg/m. -Low calorie diet, portion control and increase physical activity discussed with patient.   Obstructive sleep apnea - Continue the use of CPAP nightly   Essential hypertension, benign - Stable overall -Continue current antihypertensive agent -Heart healthy diet discussed with patient.   Mild hypokalemia -Goal potassium of 4.0 -Replete IV and recheck in a.m.     DVT prophylaxis: Currently full dose Lovenox Code Status: Full Family Communication:  Discussed with  granddaughter at bedside 7/31 Disposition Plan: Family refusing SNF, plan for outpatient home health PT and palliative Status is: Inpatient Remains inpatient appropriate because: Currently on IV antibiotics   Consultants:  Pulmonology GI   Procedures:  None   Antimicrobials:  Anti-infectives (From admission, onward)    Start     Dose/Rate Route Frequency Ordered Stop   12/22/21 1400  ceFEPIme (MAXIPIME) 2 g in sodium chloride 0.9 % 100 mL IVPB  Status:  Discontinued        2 g 200 mL/hr over 30 Minutes Intravenous Every 8 hours 12/22/21 0830 12/22/21 1026   12/22/21 1400  ceFEPIme (MAXIPIME) 2 g in sodium chloride 0.9 % 100 mL IVPB        2 g 200 mL/hr over 30 Minutes Intravenous Every 8 hours 12/22/21 1026 12/22/21 2159   12/22/21 1100  doxycycline (VIBRAMYCIN) 100 mg in sodium chloride 0.9 % 250 mL IVPB        100 mg 125 mL/hr over 120 Minutes Intravenous Every 12 hours 12/22/21 1026 12/23/21 0014   12/20/21 1000  doxycycline (VIBRAMYCIN) 100 mg in sodium chloride 0.9 % 250 mL IVPB  Status:  Discontinued        100 mg 125 mL/hr over 120 Minutes Intravenous Every 12 hours 12/20/21 0705 12/22/21 1026   12/19/21 0830  ceFEPIme (MAXIPIME) 2 g in sodium chloride 0.9 % 100 mL IVPB  Status:  Discontinued        2 g 200 mL/hr over 30 Minutes Intravenous Every 12 hours 12/19/21 0738 12/22/21 0830   12/17/21 0000  cefTRIAXone (ROCEPHIN) 1 g in sodium chloride 0.9 % 100 mL IVPB  Status:  Discontinued        1 g 200 mL/hr over 30 Minutes Intravenous Every 24 hours 12/16/21 2150 12/19/21 0704   12/16/21 2200  doxycycline (VIBRA-TABS) tablet 100 mg  Status:  Discontinued        100 mg Oral Every 12 hours 12/16/21 2150 12/20/21 0704   12/16/21 1945  cefTRIAXone (ROCEPHIN) 1 g in sodium chloride 0.9 % 100 mL IVPB        1 g 200 mL/hr over 30 Minutes Intravenous  Once 12/16/21 1939 12/16/21 2156   12/16/21 1945  doxycycline (VIBRA-TABS) tablet 100 mg        100 mg Oral  Once  12/16/21 1939 12/16/21 2000       Subjective: Patient seen and evaluated today with no new acute complaints or concerns. No acute concerns or events noted overnight.  He continues to have abdominal distention.  Objective: Vitals:   12/23/21 2021 12/24/21 0502 12/24/21 0845 12/24/21 0847  BP: (!) 130/52 (!) 134/51    Pulse: 76 72    Resp: 20     Temp: 98.4 F (36.9 C) 97.9 F (36.6 C)    TempSrc: Oral Oral    SpO2: 97% 94% 91% 96%  Weight:      Height:        Intake/Output Summary (Last 24 hours) at 12/24/2021 0941 Last data filed at 12/24/2021 0100 Gross per 24 hour  Intake 115 ml  Output 1200 ml  Net -1085 ml   Filed Weights   12/16/21 1524 12/17/21 0003  Weight: 131.5 kg 131.9 kg    Examination:  General exam: Appears calm and comfortable, obese Respiratory system: Clear to auscultation. Respiratory effort normal.  Nasal CPAP is on Cardiovascular system: S1 & S2 heard, RRR.  Gastrointestinal system: Abdomen is distended, rectal tube with stool output  noted Central nervous system: Alert and awake Extremities: No edema Skin: No significant lesions noted Psychiatry: Flat affect.    Data Reviewed: I have personally reviewed following labs and imaging studies  CBC: Recent Labs  Lab 12/20/21 0518 12/21/21 0526 12/22/21 0550 12/23/21 0604 12/24/21 0537  WBC 23.9* 20.9* 16.9* 16.8* 13.5*  HGB 9.2* 8.4* 7.7* 8.2* 8.0*  HCT 31.1* 28.6* 25.8* 28.1* 27.7*  MCV 67.9* 67.9* 67.9* 70.4* 70.3*  PLT 873* 799* 755* 662* 716*   Basic Metabolic Panel: Recent Labs  Lab 12/20/21 0518 12/21/21 0526 12/22/21 0550 12/23/21 0604 12/24/21 0537  NA 137 137 138 139 139  K 3.4* 3.3* 3.3* 3.5 3.5  CL 104 108 111 114* 115*  CO2 20* 20* 21* 20* 19*  GLUCOSE 214* 185* 156* 153* 112*  BUN 46* 57* 41* 28* 20  CREATININE 1.44* 1.52* 1.19 1.08 0.95  CALCIUM 8.8* 8.6* 8.2* 8.6* 8.4*  MG 2.4 2.4 2.2 2.2 2.1   GFR: Estimated Creatinine Clearance: 86.1 mL/min (by C-G formula  based on SCr of 0.95 mg/dL). Liver Function Tests: Recent Labs  Lab 12/21/21 0543  ALBUMIN 2.3*   No results for input(s): "LIPASE", "AMYLASE" in the last 168 hours. No results for input(s): "AMMONIA" in the last 168 hours. Coagulation Profile: No results for input(s): "INR", "PROTIME" in the last 168 hours. Cardiac Enzymes: No results for input(s): "CKTOTAL", "CKMB", "CKMBINDEX", "TROPONINI" in the last 168 hours. BNP (last 3 results) Recent Labs    02/18/21 1555  PROBNP 99   HbA1C: No results for input(s): "HGBA1C" in the last 72 hours. CBG: Recent Labs  Lab 12/23/21 1200 12/23/21 1612 12/23/21 2018 12/24/21 0102 12/24/21 0709  GLUCAP 144* 129* 107* 115* 107*   Lipid Profile: No results for input(s): "CHOL", "HDL", "LDLCALC", "TRIG", "CHOLHDL", "LDLDIRECT" in the last 72 hours. Thyroid Function Tests: No results for input(s): "TSH", "T4TOTAL", "FREET4", "T3FREE", "THYROIDAB" in the last 72 hours. Anemia Panel: No results for input(s): "VITAMINB12", "FOLATE", "FERRITIN", "TIBC", "IRON", "RETICCTPCT" in the last 72 hours. Sepsis Labs: Recent Labs  Lab 12/18/21 0617 12/19/21 0536 12/20/21 0518  PROCALCITON 0.32 0.48 0.53    Recent Results (from the past 240 hour(s))  Culture, blood (routine x 2)     Status: None   Collection Time: 12/16/21  6:47 PM   Specimen: BLOOD  Result Value Ref Range Status   Specimen Description   Final    BLOOD LEFT ANTECUBITAL Performed at [facility], [place] [address] [place], [place] [address]    Special Requests   Final    BOTTLES DRAWN AEROBIC AND ANAEROBIC Blood Culture results may not be optimal due to an excessive volume of blood received in culture bottles Performed at [place] [address], [place], [place] [address]    Culture   Final    NO GROWTH 5 DAYS Performed at [facility], [address] [place], [place] [address]    Report Status 12/21/2021 FINAL  Final  Culture, blood (routine x 2)      Status: None   Collection Time: 12/16/21  6:52 PM   Specimen: BLOOD  Result Value Ref Range Status   Specimen Description   Final    BLOOD RIGHT ANTECUBITAL Performed at [facility], [place] [address], [place],  [address]    Special Requests   Final    BOTTLES DRAWN AEROBIC AND ANAEROBIC Blood Culture adequate volume Performed at [facility], [place],  [place] [address]    Culture   Final    NO GROWTH 5 DAYS Performed at [facility], [address], [place], [place] [address]    Report Status 12/21/2021 FINAL  Final  SARS Coronavirus 2 by RT PCR (hospital order, performed in [facility] hospital lab) *cepheid single result test* Anterior Nasal Jensen     Status: None   Collection Time: 12/16/21  8:07 PM   Specimen: Anterior Nasal Jensen  Result Value Ref Range Status   SARS Coronavirus 2 by RT PCR NEGATIVE NEGATIVE Final    Comment: (NOTE) SARS-CoV-2 target nucleic acids are NOT DETECTED.  The SARS-CoV-2 RNA is generally detectable in upper and lower respiratory specimens during the acute phase of infection. The lowest concentration of SARS-CoV-2 viral copies this assay can detect is 250 copies / mL. A negative result does not preclude SARS-CoV-2 infection and should not be used as the sole basis for treatment or other patient management decisions.  A negative result may occur with improper specimen collection / handling, submission of specimen other than nasopharyngeal Jensen, presence of viral mutation(s) within the areas targeted by this assay, and inadequate number of viral copies (<250 copies / mL). A negative result must be combined with clinical observations, patient history, and epidemiological information.  Fact Sheet for Patients:   https://www.patel.info/  Fact Sheet for Healthcare Providers: https://hall.com/  This test is not yet approved or  cleared by the [place] FDA and has been  authorized for detection and/or diagnosis of SARS-CoV-2 by FDA under an Emergency Use Authorization (EUA).  This EUA will remain in effect (meaning this test can be used) for the duration of the COVID-19 declaration under Section 564(b)(1) of the Act, 21 U.S.C. section 360bbb-3(b)(1), unless the authorization is terminated or revoked sooner.  Performed at [facility], [address] [place], [place] [address]   MRSA Next Gen by PCR, Nasal     Status: None   Collection Time: 12/19/21  7:05 AM   Specimen: Nasal Mucosa; Nasal Jensen  Result Value Ref Range Status   MRSA by PCR Next Gen NOT DETECTED NOT DETECTED Final    Comment: (NOTE) The GeneXpert MRSA Assay (FDA approved for NASAL specimens only), is one component of a comprehensive MRSA colonization surveillance program. It is not intended to diagnose MRSA infection nor to guide or monitor treatment for MRSA infections. Test performance is not FDA approved in patients less than 36 years old. Performed at [facility], [address] [place], [place] [address]   Culture, blood (Routine X 2) w Reflex to ID Panel     Status: None   Collection Time: 12/19/21  7:11 AM   Specimen: BLOOD RIGHT HAND  Result Value Ref Range Status   Specimen Description BLOOD RIGHT HAND  Final   Special Requests   Final    BOTTLES DRAWN AEROBIC AND ANAEROBIC Blood Culture adequate volume   Culture   Final    NO GROWTH 5 DAYS Performed at [facility], [address], [place], [place] [address]    Report Status 12/24/2021 FINAL  Final  Culture, blood (Routine X 2) w Reflex to ID Panel     Status: None   Collection Time: 12/19/21  7:12 AM   Specimen: Right Antecubital; Blood  Result Value Ref Range Status   Specimen Description RIGHT ANTECUBITAL  Final   Special Requests   Final    BOTTLES DRAWN AEROBIC AND ANAEROBIC Blood Culture adequate volume   Culture  Final    NO GROWTH 5 DAYS Performed at [facility], [address], [place], [place]  [address]    Report Status 12/24/2021 FINAL  Final         Radiology Studies: CT ABDOMEN PELVIS W CONTRAST  Result Date: 12/23/2021 CLINICAL DATA:  Bowel obstruction suspected. Abdominal distention, abdominal pain, and bloating. EXAM: CT ABDOMEN AND PELVIS WITH CONTRAST TECHNIQUE: Multidetector CT imaging of the abdomen and pelvis was performed using the standard protocol following bolus administration of intravenous contrast. RADIATION DOSE REDUCTION: This exam was performed according to the departmental dose-optimization program which includes automated exposure control, adjustment of the mA and/or kV according to patient size and/or use of iterative reconstruction technique. CONTRAST:  170m OMNIPAQUE IOHEXOL 300 MG/ML  SOLN COMPARISON:  10/15/2021 FINDINGS: Lower chest: Consolidation in the lung bases, greater on the left. Minimal pleural effusions. Changes could represent compressive atelectasis or multifocal pneumonia. Small esophageal hiatal hernia. Hepatobiliary: Cholelithiasis with small stones in the gallbladder. No gallbladder wall thickening or inflammation. No bile duct dilatation. Focal low-attenuation lesion in segment 6 of the liver measuring 4.7 cm maximal diameter. This is unchanged since previous study. This has been evaluated on previous imaging including MRI 06/19/2021 where it was reported as consistent with post treatment ablation cavity. Pancreas: Scattered calcifications in the pancreas suggesting chronic pancreatitis. No acute inflammatory changes. Spleen: Normal in size without focal abnormality. Adrenals/Urinary Tract: Right adrenal gland nodule, 2.6 cm diameter, 31 Hounsfield units on contrast enhanced examination. No change since previous studies dating back to 02/01/2009. Long-term stability is consistent with benign etiology and no additional imaging follow-up is indicated. Kidneys, ureters, and the bladder are unremarkable. Stomach/Bowel: Stomach is decompressed. Majority of  the small bowel are decompressed but there are left lower quadrant and left mid abdominal jejunal loops which are distended with air-fluid level. Suggestion of mild wall thickening. Changes could represent early phase of obstruction due to internal hernia. No pneumatosis or mesenteric stranding is noted. The colon is distended with gas and stool, most prominent in the transverse colon. This is likely ileus. A rectal catheter is present with inflated balloon. Appendix is not identified. Vascular/Lymphatic: Aortic atherosclerosis. No enlarged abdominal or pelvic lymph nodes. Reproductive: Prostate gland is surgically absent. Other: No free air or free fluid in the abdomen. Abdominal wall musculature appears intact. Musculoskeletal: Degenerative changes.  No acute bony abnormalities. IMPRESSION: 1. Consolidation in the lung bases, greater on the left, possibly pneumonia or compressive atelectasis. 2. Mildly dilated left abdominal small bowel with air-fluid levels. Proximal and distal small bowel are decompressed. Changes could represent localized ileus versus early obstruction due to internal hernia. No ischemic changes are visualized. 3. Stool-filled and gaseous distention mostly of the transverse colon, likely ileus. 4. Stable appearance of liver segment 6 ablation cavity. 5. 2.6 cm right adrenal gland nodule is stable since 02/01/2009 suggesting benign etiology. No additional imaging follow-up is indicated. 6. Aortic atherosclerosis. Electronically Signed   By: WLucienne CapersM.D.   On: 12/23/2021 19:57   DG Abd 1 View  Result Date: 12/23/2021 CLINICAL DATA:  Increasing shortness of breath. Sepsis. Multiple underlying medical problems. EXAM: ABDOMEN - 1 VIEW COMPARISON:  Radiographs 12/21/2021 and 12/19/2021.  CT 10/15/2021. FINDINGS: 0527 hours. Four supine views of the abdomen are submitted. Diffuse dilatation of the small and large bowel is again noted, without significant change. No supine evidence of free  intraperitoneal air. Postsurgical changes are noted within the pelvis. There are no suspicious abdominal calcifications. IMPRESSION: No  significant change in mild diffuse bowel distension, most consistent with an ileus. No new findings. Electronically Signed   By: Richardean Sale M.D.   On: 12/23/2021 08:12        Scheduled Meds:  benzonatate  100 mg Oral TID   bisacodyl  10 mg Rectal Once   budesonide (PULMICORT) nebulizer solution  0.5 mg Nebulization BID   dextromethorphan-guaiFENesin  1 tablet Oral BID   diclofenac Sodium  4 g Topical QID   diltiazem  240 mg Oral Daily   enoxaparin (LOVENOX) injection  130 mg Subcutaneous Q12H   insulin aspart  0-5 Units Subcutaneous QHS   insulin aspart  0-6 Units Subcutaneous TID WC   insulin aspart  4 Units Subcutaneous TID WC   insulin glargine-yfgn  10 Units Subcutaneous Daily   metoprolol succinate  50 mg Oral Daily   pantoprazole (PROTONIX) IV  40 mg Intravenous Q24H   polyethylene glycol  17 g Oral BID   pregabalin  100 mg Oral BID   sodium chloride flush  3 mL Intravenous Q12H   sodium chloride flush  3 mL Intravenous Q12H   Continuous Infusions:  sodium chloride     lactated ringers     potassium chloride 10 mEq (12/24/21 0750)     LOS: 8 days    Time spent: 35 minutes    Sebastain Fishbaugh Darleen Crocker, DO [facility]  If 7PM-7AM, please contact night-coverage [email or web address] 12/24/2021, 9:41 AM

## 2021-12-24 NOTE — Progress Notes (Addendum)
PT Cancellation Note  Patient Details Name: Steve Jensen MRN: [id number] DOB: 04-14-1941   Cancelled Treatment:    Reason Eval/Treat Not Completed: Pain limiting ability to participate.  In AM therapy attempted, but patient unable to tolerate moving legs due to c/o spasm like sensation and pain - RN notified.  In PM patient not available for therapy due to going out for procedure.    11:28 AM, 12/24/21 Lonell Grandchild, MPT Physical Therapist with [facility] [phone] office [phone] mobile phone

## 2021-12-25 DIAGNOSIS — I10 Essential (primary) hypertension: Secondary | ICD-10-CM | POA: Diagnosis not present

## 2021-12-25 DIAGNOSIS — I480 Paroxysmal atrial fibrillation: Secondary | ICD-10-CM | POA: Diagnosis not present

## 2021-12-25 DIAGNOSIS — J189 Pneumonia, unspecified organism: Secondary | ICD-10-CM | POA: Diagnosis not present

## 2021-12-25 DIAGNOSIS — K567 Ileus, unspecified: Secondary | ICD-10-CM | POA: Diagnosis not present

## 2021-12-25 LAB — CBC
HCT: 29.2 % — ABNORMAL LOW (ref 39.0–52.0)
Hemoglobin: 8.3 g/dL — ABNORMAL LOW (ref 13.0–17.0)
MCH: 20.2 pg — ABNORMAL LOW (ref 26.0–34.0)
MCHC: 28.4 g/dL — ABNORMAL LOW (ref 30.0–36.0)
MCV: 71.2 fL — ABNORMAL LOW (ref 80.0–100.0)
Platelets: 652 10*3/uL — ABNORMAL HIGH (ref 150–400)
RBC: 4.1 MIL/uL — ABNORMAL LOW (ref 4.22–5.81)
RDW: 21.1 % — ABNORMAL HIGH (ref 11.5–15.5)
WBC: 12.5 10*3/uL — ABNORMAL HIGH (ref 4.0–10.5)
nRBC: 0 % (ref 0.0–0.2)

## 2021-12-25 LAB — BASIC METABOLIC PANEL
Anion gap: 6 (ref 5–15)
BUN: 15 mg/dL (ref 8–23)
CO2: 22 mmol/L (ref 22–32)
Calcium: 8.6 mg/dL — ABNORMAL LOW (ref 8.9–10.3)
Chloride: 115 mmol/L — ABNORMAL HIGH (ref 98–111)
Creatinine, Ser: 0.87 mg/dL (ref 0.61–1.24)
GFR, Estimated: >60 mL/min (ref >60)
Glucose, Bld: 124 mg/dL — ABNORMAL HIGH (ref 70–99)
Potassium: 4 mmol/L (ref 3.5–5.1)
Sodium: 143 mmol/L (ref 135–145)

## 2021-12-25 LAB — GLUCOSE, CAPILLARY
Glucose-Capillary: 127 mg/dL — ABNORMAL HIGH (ref 70–99)
Glucose-Capillary: 203 mg/dL — ABNORMAL HIGH (ref 70–99)
Glucose-Capillary: 117 mg/dL — ABNORMAL HIGH (ref 70–99)
Glucose-Capillary: 138 mg/dL — ABNORMAL HIGH (ref 70–99)

## 2021-12-25 LAB — MAGNESIUM: Magnesium: 2.2 mg/dL (ref 1.7–2.4)

## 2021-12-25 LAB — URINE CULTURE: Culture: NO GROWTH

## 2021-12-25 NOTE — TOC Progression Note (Signed)
Transition of Care [facility]) - Progression Note    Patient Details  Name: BELINDA BRINGHURST MRN: [id number] Date of Birth: [birth year]  [facility] CM/SW Contact  Boneta Lucks, RN Phone Number: 12/25/2021, 3:32 PM  Clinical Narrative:   PT worked with patient, family present. Family is agreeable to SNF. [facility] choice [facility], patient has not had one covid vaccine. TOC explained [facility] will not make a bed offer. Second choice [facility]. TOC reached out to [facility], waiting to hear about bed status. Next choice would be facilities in [place]. Patient lives with grand daughter, they live in [place].  Expected Discharge Plan: Skilled Nursing Facility Barriers to Discharge: Continued Medical Work up  Expected Discharge Plan and Services Expected Discharge Plan: [place] In-house Referral: Clinical Social Work Discharge Planning Services: CM Consult Post Acute Care Choice: [place] Living arrangements for the past 2 months: Single Family Home                   Readmission Risk Interventions    12/25/2021    3:31 PM 12/19/2021   12:39 PM  Readmission Risk Prevention Plan  Transportation Screening Complete Complete  Home Care Screening Complete   Medication Review (RN CM) Complete   HRI or Home Care Consult  Complete  Social Work Consult for [place] Planning/Counseling  Complete  Palliative Care Screening  Not Applicable  Medication Review Press photographer)  Complete

## 2021-12-25 NOTE — Progress Notes (Signed)
[place] for lovenox Indication: atrial fibrillation  Allergies  Allergen Reactions   Lipitor [Atorvastatin] Other (See Comments)    Muscle aches   Simvastatin Other (See Comments)    Muscle aches   Zithromax [Azithromycin] Other (See Comments)    GI-SIDE EFFECTS   Amlodipine Other (See Comments)    Patient Measurements: Height: '5\' 10"'$  (177.8 cm) Weight: 131.9 kg (290 lb 12.6 oz) IBW/kg (Calculated) : 73  Vital Signs: Temp: 97.9 F (36.6 C) (08/02 0524) BP: 158/75 (08/02 0524) Pulse Rate: 79 (08/02 0524)  Labs: Recent Labs    12/23/21 0604 12/24/21 0537 12/25/21 0535  HGB 8.2* 8.0* 8.3*  HCT 28.1* 27.7* 29.2*  PLT 662* 638* 652*  CREATININE 1.08 0.95 0.87     Estimated Creatinine Clearance: 94.1 mL/min (by C-G formula based on SCr of 0.87 mg/dL).   Medical History: Past Medical History:  Diagnosis Date   Anemia    Diabetes mellitus type II, controlled ([place])    with neuropathy   Dysrhythmia    A-Fib. cardioversion done   GERD (gastroesophageal reflux disease)    Hyperlipidemia    Hypertension    Morbid obesity ([place])    Neuromuscular disorder ([place])    feet   OSA (obstructive sleep apnea)    On BiPAP at 15/11cm H2O   Prostate cancer ([place])    Shingles    on face Nov 2010   Urinary incontinence     Medications:  Medications Prior to Admission  Medication Sig Dispense Refill Last Dose   allopurinol (ZYLOPRIM) 100 MG tablet Take 100 mg by mouth daily.   12/15/2021   apixaban (ELIQUIS) 5 MG TABS tablet TAKE 1 TABLET(5 MG) BY MOUTH TWICE DAILY 180 tablet 1 12/15/2021 at 1030   CINNAMON PO Take 1,000 mg by mouth daily.   12/15/2021   diltiazem (CARDIZEM CD) 240 MG 24 hr capsule TAKE 1 CAPSULE BY MOUTH  DAILY 90 capsule 3 12/15/2021   fish oil-omega-3 fatty acids 1000 MG capsule Take 2 g by mouth 2 (two) times daily.    12/15/2021   furosemide (LASIX) 40 MG tablet TAKE 1 TABLET BY MOUTH TWICE  DAILY AS NEEDED 180  tablet 2 12/15/2021   ibuprofen (ADVIL,MOTRIN) 200 MG tablet Take 800 mg by mouth every 6 (six) hours as needed (PAIN). prn   12/15/2021   loratadine (CLARITIN) 10 MG tablet Take 10 mg by mouth daily as needed for allergies.    12/15/2021   metFORMIN (GLUCOPHAGE) 1000 MG tablet Take 1,000 mg by mouth 2 (two) times daily.  0 12/15/2021   metoprolol succinate (TOPROL-XL) 50 MG 24 hr tablet Take 50 mg by mouth daily.    12/15/2021 at 1930   Multiple Vitamins-Minerals (MULTIVITAMIN WITH MINERALS) tablet Take 1 tablet by mouth daily.   12/15/2021   omeprazole (PRILOSEC) 20 MG capsule Take 20 mg by mouth daily.    12/15/2021   oxymetazoline (AFRIN) 0.05 % nasal spray Place 1 spray into both nostrils 2 (two) times daily as needed for congestion.   12/15/2021   potassium chloride SA (KLOR-CON M) 20 MEQ tablet TAKE 1 TABLET BY MOUTH  DAILY AS NEEDED WHILE  TAKING FUROSEMIDE (Patient taking differently: Take 20 mEq by mouth daily.) 90 tablet 3 12/15/2021   pregabalin (LYRICA) 100 MG capsule Take 100 mg by mouth 2 (two) times a day.    12/15/2021   ramipril (ALTACE) 10 MG capsule Take 10 mg by mouth 2 (two) times daily.  12/15/2021   tirzepatide (MOUNJARO) 5 MG/0.5ML Pen Inject 5 mg into the skin once a week.   Past Week   ZETIA 10 MG tablet Take 10 mg by mouth daily.    12/15/2021    Assessment: Pharmacy consulted to dose lovenox in patient with atrial fibrillation.  Patient is on apixaban prior to admission with last dose given 7/27 @ 0948. Patient still with ileus, awaiting bowel fxn return. Hgb 8.3, stable  Goal of Therapy:   Monitor platelets by anticoagulation protocol: Yes   Plan:  Continue Lovenox 130 mg subq every 12 hours (starting at 2200) Monitor labs and s/s of bleeding.  Isac Sarna, BS Pharm D, BCPS Clinical Pharmacist 12/25/2021 8:39 AM

## 2021-12-25 NOTE — Progress Notes (Signed)
Steve Jensen  UQJ:[id number] DOB: 10-02-1940 DOA: 12/16/2021 PCP: [name], MD   Brief Narrative:    Rawad Trella  is a 81 y.o. male reformed smoker with past medical history relevant for morbid obesity/OSA, chronic A-fib status post prior cardioversion chronically on Eliquis for stroke prophylaxis, HTN, chronic diastolic dysfunction CHF and advanced osteoarthritis as well as diabetes mellitus who presents to the ED with complaints of productive cough with colored sputum, shortness of breath and dyspnea on exertion since 12/13/2021.  Patient was admitted for severe sepsis secondary to community-acquired pneumonia, present on admission and he has now completed IV antibiotic course.  He is also noted to have associated acute hypoxemic respiratory failure.  He now has ileus for which rectal tube has been placed.  Pulmonology consulted due to ongoing hypoxemia and poor progression.   GI consulted due to ongoing ileus; s/p colonoscopy. Continue slowly advancing diet. Continue miralax TID and increase ambulation.  Assessment & Plan:   Principal Problem:   Pneumonia Active Problems:   Sepsis due to CAP/PNA   Essential hypertension, benign   Obstructive sleep apnea   Morbid obesity (HCC)   Atrial fibrillation ([place])   Uncontrolled type 2 diabetes mellitus with hyperglycemia, without long-term current use of insulin (HCC)   Osteoarthritis of right knee   Ileus (HCC)  Assessment and Plan:  Severe sepsis, present on admission, due to CAP/PNA-ongoing - Patient met criteria for severe sepsis due to pneumonia at time of admission; febrile, elevated WBCs, tachycardic/tachypneic, source of infection left lower lobe pneumonia and organ dysfunction his lungs with the presence of hypoxia requiring oxygen supplementation. -Repeat blood cultures with fever overnight and check urine cultures and chest x-ray -MRSA PCR negative -Appreciate pulmonology evaluation, now signed  off -Patient has completed 7-day course of IV antibiotics 7/30   Acute hypoxemic respiratory failure secondary to above -Currently on 3 L nasal cannula oxygen, continue to wean as tolerated -Chest x-ray 7/27 with atelectasis and demonstrating improvement in variation. -Continue incentive spirometry and flutter valve. -Continue increasing physical activity/ambulation.  This   Ileus -Started rectal tube 7/27 -Encourage to increase mobility (working with PT/OT and also rotating in bed). -Completed treatment with Reglan as per PCCM recommendations. -Status post decompressive colonoscopy  -appreciate ongoing GI evaluation -Continue advancing diet; continue the use of MiraLAX. -Maintain electrolytes stable.   Osteoarthritis of right knee -Patient complaining of significant pain -Continue the use of Voltaren gel -Minimize/avoid the use of narcotic agents given ongoing ileus process. -Outpatient follow-up with orthopedic service as he has been advised of the need for knee replacement.   Uncontrolled type 2 diabetes mellitus with hyperglycemia, without long-term current use of insulin (HCC) with hyperglycemia -A1c 7.6  -Continue sliding scale insulin; NovoLog for meal coverage added. -Follow CBGs fluctuation and adjust hypoglycemic regimen as required -Continue holding oral hypoglycemic agents while inpatient. -Continue current dose of long-acting insulin.   Atrial fibrillation (HCC) -Paroxysmal in nature -Continue telemetry monitoring; currently sinus rhythm and rate controlled. -Continue the use of metoprolol and Cardizem -Continue Lovenox for secondary prevention until no further inpatient procedures anticipated.     Morbid obesity ([place]) -Body mass index is 41.72 kg/m. -Low calorie diet and portion control discussed with patient.   Obstructive sleep apnea -Continue the use of CPAP nightly -Patient demonstrating good compliance.   Essential hypertension, benign -Stable  overall; follow vital signs. -Continue current antihypertensive agent -Heart healthy diet discussed with patient.   Mild hypokalemia -Goal potassium of 4.0 -  Continue to follow trend and further replete as needed.    DVT prophylaxis: Currently full dose Lovenox Code Status: Full Family Communication: No family at bedside during evaluation; all questions and plan of care discussed with patient directly.  (12/24/2021). Disposition Plan: Physical therapy recommending skilled nursing facility at discharge; will continue discussing with family. Status is: Inpatient Remains inpatient appropriate because: Currently on IV antibiotics   Consultants:  Pulmonology GI   Procedures:  None   Antimicrobials:  Anti-infectives (From admission, onward)    Start     Dose/Rate Route Frequency Ordered Stop   12/22/21 1400  ceFEPIme (MAXIPIME) 2 g in sodium chloride 0.9 % 100 mL IVPB  Status:  Discontinued        2 g 200 mL/hr over 30 Minutes Intravenous Every 8 hours 12/22/21 0830 12/22/21 1026   12/22/21 1400  ceFEPIme (MAXIPIME) 2 g in sodium chloride 0.9 % 100 mL IVPB        2 g 200 mL/hr over 30 Minutes Intravenous Every 8 hours 12/22/21 1026 12/22/21 2159   12/22/21 1100  doxycycline (VIBRAMYCIN) 100 mg in sodium chloride 0.9 % 250 mL IVPB        100 mg 125 mL/hr over 120 Minutes Intravenous Every 12 hours 12/22/21 1026 12/23/21 0014   12/20/21 1000  doxycycline (VIBRAMYCIN) 100 mg in sodium chloride 0.9 % 250 mL IVPB  Status:  Discontinued        100 mg 125 mL/hr over 120 Minutes Intravenous Every 12 hours 12/20/21 0705 12/22/21 1026   12/19/21 0830  ceFEPIme (MAXIPIME) 2 g in sodium chloride 0.9 % 100 mL IVPB  Status:  Discontinued        2 g 200 mL/hr over 30 Minutes Intravenous Every 12 hours 12/19/21 0738 12/22/21 0830   12/17/21 0000  cefTRIAXone (ROCEPHIN) 1 g in sodium chloride 0.9 % 100 mL IVPB  Status:  Discontinued        1 g 200 mL/hr over 30 Minutes Intravenous Every 24 hours  12/16/21 2150 12/19/21 0704   12/16/21 2200  doxycycline (VIBRA-TABS) tablet 100 mg  Status:  Discontinued        100 mg Oral Every 12 hours 12/16/21 2150 12/20/21 0704   12/16/21 1945  cefTRIAXone (ROCEPHIN) 1 g in sodium chloride 0.9 % 100 mL IVPB        1 g 200 mL/hr over 30 Minutes Intravenous  Once 12/16/21 1939 12/16/21 2156   12/16/21 1945  doxycycline (VIBRA-TABS) tablet 100 mg        100 mg Oral  Once 12/16/21 1939 12/16/21 2000       Subjective: No chest pain, no nausea, no vomiting.  Reports feeling better and breathing easier.  Generally weak/deconditioned and complaining of ongoing intermittent muscle spasms/knee pain bilaterally (right more than left due to osteoarthritis).  Reported having bowel movement overnight and feels abdomen less distended.  Objective: Vitals:   12/24/21 2044 12/25/21 0524 12/25/21 1529 12/25/21 1600  BP: 125/74 (!) 158/75 137/67   Pulse: 85 79 66   Resp: (!) _0 Temp: 98.1 F (36.7 C) 97.9 F (36.6 C)  97.6 F (36.4 C)  TempSrc:    Oral  SpO2: 95% 95% 94%   Weight:      Height:        Intake/Output Summary (Last 24 hours) at 12/25/2021 1837 Last data filed at 12/25/2021 1100 Gross per 24 hour  Intake 883 ml  Output 2200 ml  Net -1317  ml   Filed Weights   12/16/21 1524 12/17/21 0003  Weight: 131.5 kg 131.9 kg    Examination: General exam: Alert, awake, oriented x 3; following commands appropriately.  Reports breathing is improving.  Had bowel movement overnight and reports no abdominal pain, nausea or vomiting. Respiratory system: Positive scattered rhonchi; no using accessory muscles.  Still demonstrating mild difficulty speaking in full sentences. Cardiovascular system: S1 and S2; no rubs, no gallops, no murmurs appreciated on exam. Gastrointestinal system: Abdomen is obese, distended, positive bowel sounds appreciated.  Rectal tube in place.  No guarding, no tenderness on palpation.   Central nervous system: Alert and  oriented. No focal neurological deficits.  Reports generalized weakness and deconditioning. Extremities: No cyanosis or clubbing; pedal edema appreciated bilaterally.  Decreased range of motion in his legs (right more than left due to underlying history of osteoarthritis). Skin: No petechiae. Psychiatry: Judgement and insight appear normal.  Flat affect.    Data Reviewed: I have personally reviewed following labs and imaging studies  CBC: Recent Labs  Lab 12/21/21 0526 12/22/21 0550 12/23/21 0604 12/24/21 0537 12/25/21 0535  WBC 20.9* 16.9* 16.8* 13.5* 12.5*  HGB 8.4* 7.7* 8.2* 8.0* 8.3*  HCT 28.6* 25.8* 28.1* 27.7* 29.2*  MCV 67.9* 67.9* 70.4* 70.3* 71.2*  PLT 799* 755* 662* 638* 626*   Basic Metabolic Panel: Recent Labs  Lab 12/21/21 0526 12/22/21 0550 12/23/21 0604 12/24/21 0537 12/25/21 0535  NA 137 138 139 139 143  K 3.3* 3.3* 3.5 3.5 4.0  CL 108 111 114* 115* 115*  CO2 20* 21* 20* 19* 22  GLUCOSE 185* 156* 153* 112* 124*  BUN 57* 41* 28* 20 15  CREATININE 1.52* 1.19 1.08 0.95 0.87  CALCIUM 8.6* 8.2* 8.6* 8.4* 8.6*  MG 2.4 2.2 2.2 2.1 2.2   GFR: Estimated Creatinine Clearance: 94.1 mL/min (by C-G formula based on SCr of 0.87 mg/dL).  Liver Function Tests: Recent Labs  Lab 12/21/21 0543  ALBUMIN 2.3*   BNP (last 3 results) Recent Labs    02/18/21 1555  PROBNP 99   CBG: Recent Labs  Lab 12/24/21 1634 12/24/21 2057 12/25/21 0759 12/25/21 1114 12/25/21 1629  GLUCAP 113* 221* 127* 203* 117*   Sepsis Labs: Recent Labs  Lab 12/19/21 0536 12/20/21 0518  PROCALCITON 0.48 0.53    Recent Results (from the past 240 hour(s))  Culture, blood (routine x 2)     Status: None   Collection Time: 12/16/21  6:47 PM   Specimen: BLOOD  Result Value Ref Range Status   Specimen Description   Final    BLOOD LEFT ANTECUBITAL Performed at [facility], [place] [address], [place], [place] [address]    Special Requests   Final    BOTTLES DRAWN AEROBIC AND  ANAEROBIC Blood Culture results may not be optimal due to an excessive volume of blood received in culture bottles Performed at [employer] [facility] [address], [place], [place] [address]    Culture   Final    NO GROWTH 5 DAYS Performed at [facility], [address] [place], [place] [address]    Report Status 12/21/2021 FINAL  Final  Culture, blood (routine x 2)     Status: None   Collection Time: 12/16/21  6:52 PM   Specimen: BLOOD  Result Value Ref Range Status   Specimen Description   Final    BLOOD RIGHT ANTECUBITAL Performed at [facility], [place] [address], [place], [place] [address]  Special Requests   Final    BOTTLES DRAWN AEROBIC AND ANAEROBIC Blood Culture adequate volume Performed at [facility], [place] [address] [place], [place] [address]    Culture   Final    NO GROWTH 5 DAYS Performed at [facility], [address], [place], [place] [address]    Report Status 12/21/2021 FINAL  Final  SARS Coronavirus 2 by RT PCR (hospital order, performed in [facility] hospital lab) *cepheid single result test* Anterior Nasal Swab     Status: None   Collection Time: 12/16/21  8:07 PM   Specimen: Anterior Nasal Swab  Result Value Ref Range Status   SARS Coronavirus 2 by RT PCR NEGATIVE NEGATIVE Final    Comment: (NOTE) SARS-CoV-2 target nucleic acids are NOT DETECTED.  The SARS-CoV-2 RNA is generally detectable in upper and lower respiratory specimens during the acute phase of infection. The lowest concentration of SARS-CoV-2 viral copies this assay can detect is 250 copies / mL. A negative result does not preclude SARS-CoV-2 infection and should not be used as the sole basis for treatment or other patient management decisions.  A negative result may occur with improper specimen collection / handling, submission of specimen other than nasopharyngeal swab, presence of viral mutation(s) within the areas targeted by this assay, and inadequate number of viral  copies (<250 copies / mL). A negative result must be combined with clinical observations, patient history, and epidemiological information.  Fact Sheet for Patients:   https://www.patel.info/  Fact Sheet for Healthcare Providers: https://hall.com/  This test is not yet approved or  cleared by the [place] FDA and has been authorized for detection and/or diagnosis of SARS-CoV-2 by FDA under an Emergency Use Authorization (EUA).  This EUA will remain in effect (meaning this test can be used) for the duration of the COVID-19 declaration under Section 564(b)(1) of the Act, 21 U.S.C. section 360bbb-3(b)(1), unless the authorization is terminated or revoked sooner.  Performed at [facility], [address], [place], [place] [address]   MRSA Next Gen by PCR, Nasal     Status: None   Collection Time: 12/19/21  7:05 AM   Specimen: Nasal Mucosa; Nasal Swab  Result Value Ref Range Status   MRSA by PCR Next Gen NOT DETECTED NOT DETECTED Final    Comment: (NOTE) The GeneXpert MRSA Assay (FDA approved for NASAL specimens only), is one component of a comprehensive MRSA colonization surveillance program. It is not intended to diagnose MRSA infection nor to guide or monitor treatment for MRSA infections. Test performance is not FDA approved in patients less than 79 years old. Performed at [facility], [address], [place], [place] [address]   Culture, blood (Routine X 2) w Reflex to ID Panel     Status: None   Collection Time: 12/19/21  7:11 AM   Specimen: BLOOD RIGHT HAND  Result Value Ref Range Status   Specimen Description BLOOD RIGHT HAND  Final   Special Requests   Final    BOTTLES DRAWN AEROBIC AND ANAEROBIC Blood Culture adequate volume   Culture   Final    NO GROWTH 5 DAYS Performed at [facility], [address] [place], [place] [address]    Report Status 12/24/2021 FINAL  Final  Culture, blood (Routine X 2) w Reflex to ID Panel      Status: None   Collection Time: 12/19/21  7:12 AM   Specimen: Right Antecubital; Blood  Result Value Ref Range Status   Specimen  Description RIGHT ANTECUBITAL  Final   Special Requests   Final    BOTTLES DRAWN AEROBIC AND ANAEROBIC Blood Culture adequate volume   Culture   Final    NO GROWTH 5 DAYS Performed at [facility], [address], [place],  [place] [address]    Report Status 12/24/2021 FINAL  Final  Urine Culture     Status: None   Collection Time: 12/24/21  4:44 AM   Specimen: Urine, Clean Catch  Result Value Ref Range Status   Specimen Description   Final    URINE, CLEAN CATCH Performed at [facility], [address], [place], [place] [address]    Special Requests   Final    NONE Performed at [facility], [address] [place], [place] [address]    Culture   Final    NO GROWTH Performed at [facility], [place] [address], [place], [place] [address]    Report Status 12/25/2021 FINAL  Final     Radiology Studies: No results found.  Scheduled Meds:  benzonatate  100 mg Oral TID   bisacodyl  10 mg Rectal Once   budesonide (PULMICORT) nebulizer solution  0.5 mg Nebulization BID   dextromethorphan-guaiFENesin  1 tablet Oral BID   diclofenac Sodium  4 g Topical QID   diltiazem  240 mg Oral Daily   enoxaparin (LOVENOX) injection  130 mg Subcutaneous Q12H   insulin aspart  0-5 Units Subcutaneous QHS   insulin aspart  0-6 Units Subcutaneous TID WC   insulin aspart  4 Units Subcutaneous TID WC   insulin glargine-yfgn  10 Units Subcutaneous Daily   metoprolol succinate  50 mg Oral Daily   pantoprazole (PROTONIX) IV  40 mg Intravenous Q24H   polyethylene glycol  17 g Oral TID   pregabalin  100 mg Oral BID   sodium chloride flush  3 mL Intravenous Q12H   sodium chloride flush  3 mL Intravenous Q12H   Continuous Infusions:  sodium chloride     lactated ringers 50 mL/hr at 12/25/21 1327     LOS: 9 days    Barton Dubois, MD [facility]  If  7PM-7AM, please contact night-coverage [email or web address] 12/25/2021, 6:37 PM

## 2021-12-25 NOTE — Plan of Care (Signed)
  Problem: Pain Managment: Goal: General experience of comfort will improve Outcome: Progressing   Problem: Safety: Goal: Ability to remain free from injury will improve Outcome: Progressing   Problem: Skin Integrity: Goal: Risk for impaired skin integrity will decrease Outcome: Progressing   

## 2021-12-25 NOTE — Progress Notes (Signed)
Physical Therapy Treatment Patient Details Name: Steve Jensen MRN: [id number] DOB: 12/08/1940 Today's Date: 12/25/2021   History of Present Illness Steve Jensen  is a 81 y.o. male reformed smoker with past medical history relevant for morbid obesity/OSA, chronic A-fib status post prior cardioversion chronically on Eliquis for stroke prophylaxis, HTN, chronic diastolic dysfunction CHF and advanced osteoarthritis as well as diabetes mellitus who presents to the ED with complaints of productive cough with colored sputum, shortness of breath and dyspnea on exertion since 12/13/2021.  Patient was admitted for severe sepsis secondary to community-acquired pneumonia, present on admission.  He is also noted to have associated acute hypoxemic respiratory failure.  He now has ileus for which rectal tube has been placed.  Pulmonology consulted due to ongoing hypoxemia and poor progression.    PT Comments    Patient requires much time and frequent rest breaks during bed mobility requiring HOB fully raised and Mod/Lionel assist to complete supine to sitting mostly due to c/o severe pain in knees with pressure/movement.  Patient unable to complete sit to stands after repeated attempts due to weakness and poor tolerance for flexing knees secondary to pain.  Patient on room air with SpO2 at 93%, required 2 person assist to lift legs during sit to supine and demonstrated good return for using BUE to pull self up in bed with bed in head down position.  Patient will benefit from continued skilled physical therapy in hospital and recommended venue below to increase strength, balance, endurance for safe ADLs and gait.    Recommendations for follow up therapy are one component of a multi-disciplinary discharge planning process, led by the attending physician.  Recommendations may be updated based on patient status, additional functional criteria and insurance authorization.  Follow Up Recommendations  Skilled nursing-short  term rehab (<3 hours/day) Can patient physically be transported by private vehicle: No   Assistance Recommended at Discharge Intermittent Supervision/Assistance  Patient can return home with the following Assistance with cooking/housework;Assist for transportation;Help with stairs or ramp for entrance;A lot of help with bathing/dressing/bathroom;A lot of help with walking and/or transfers   Equipment Recommendations  None recommended by PT    Recommendations for Other Services       Precautions / Restrictions Precautions Precautions: Fall Restrictions Weight Bearing Restrictions: No     Mobility  Bed Mobility Overal bed mobility: Needs Assistance Bed Mobility: Supine to Sit, Sit to Supine     Supine to sit: Mod assist, Smayan assist, HOB elevated Sit to supine: Edel assist   General bed mobility comments: Patient requires increased time, frequent rest breaks and HOB fully rasied to commplete supine to sitting, unable to  move legs onto bed during sit to supine    Transfers                        Ambulation/Gait                   Stairs             Wheelchair Mobility    Modified Rankin (Stroke Patients Only)       Balance Overall balance assessment: Needs assistance Sitting-balance support: Feet supported, No upper extremity supported Sitting balance-Leahy Scale: Fair Sitting balance - Comments: fair/good seated at EOB  Cognition Arousal/Alertness: Awake/alert Behavior During Therapy: WFL for tasks assessed/performed Overall Cognitive Status: Within Functional Limits for tasks assessed                                          Exercises      General Comments        Pertinent Vitals/Pain Pain Assessment Pain Assessment: Faces Faces Pain Scale: Hurts even more Pain Location: bilateral knees with flexion or pressure Pain Descriptors / Indicators: Sore, Grimacing,  Guarding, Sharp Pain Intervention(s): Limited activity within patient's tolerance, Monitored during session, Premedicated before session, Repositioned    Home Living                          Prior Function            PT Goals (current goals can now be found in the care plan section) Acute Rehab PT Goals Patient Stated Goal: retrun home with family to assist after rehab PT Goal Formulation: With patient/family Time For Goal Achievement: 01/04/22 Potential to Achieve Goals: Fair Progress towards PT goals: Progressing toward goals    Frequency    Min 3X/week      PT Plan Current plan remains appropriate    Co-evaluation              AM-PAC PT "6 Clicks" Mobility   Outcome Measure  Help needed turning from your back to your side while in a flat bed without using bedrails?: A Lot Help needed moving from lying on your back to sitting on the side of a flat bed without using bedrails?: A Lot Help needed moving to and from a bed to a chair (including a wheelchair)?: Total Help needed standing up from a chair using your arms (e.g., wheelchair or bedside chair)?: Total Help needed to walk in hospital room?: Total Help needed climbing 3-5 steps with a railing? : Total 6 Click Score: 8    End of Session Equipment Utilized During Treatment: Gait belt Activity Tolerance: Patient tolerated treatment well;Patient limited by fatigue;Patient limited by pain Patient left: in bed;with call bell/phone within reach;with family/visitor present Nurse Communication: Mobility status PT Visit Diagnosis: Unsteadiness on feet (R26.81);Other abnormalities of gait and mobility (R26.89);Muscle weakness (generalized) (M62.81)     Time: 1610-9604 PT Time Calculation (min) (ACUTE ONLY): 40 min  Charges:  $Therapeutic Activity: 38-52 mins                     3:05 PM, 12/25/21 Lonell Grandchild, MPT Physical Therapist with [facility] [phone] office [phone]  mobile phone

## 2021-12-25 NOTE — Progress Notes (Signed)
Gastroenterology Progress Note   Referring Provider: No ref. provider found Primary Care Physician:  [name], MD Primary Gastroenterologist:  Dr.  Patient ID: Steve Jensen; [id number]; 21-Mar-1941    Subjective   Patient reports significant improvement today.  He denies any nausea, vomiting, abdominal pain.  He reports he has been passing gas and did have a small stool this morning.  He reports that he is hungry and has been tolerating his liquid diet very well.  If he is able to have any additional food he would welcome this.   Objective   Vital signs in last 24 hours Temp:  [97.9 F (36.6 C)-98.1 F (36.7 C)] 97.9 F (36.6 C) (08/02 0524) Pulse Rate:  [76-85] 79 (08/02 0524) Resp:  [18-22] 20 (08/02 0524) BP: (125-158)/(68-75) 158/75 (08/02 0524) SpO2:  [92 %-96 %] 95 % (08/02 0524) Last BM Date : 12/25/21  Physical Exam General:   Alert and oriented, pleasant Head:  Normocephalic and atraumatic. Eyes:  No icterus, sclera clear. Conjuctiva pink.  Mouth:  Without lesions, mucosa pink and moist.  Neck:  Supple, without thyromegaly or masses.  Heart:  S1, S2 present, no murmurs noted.  Lungs: Clear to auscultation bilaterally, without wheezing, rales, or rhonchi. Shallow breaths, wearing CPAP  Abdomen:  Bowel sounds present, soft, non-tender, rounded. No HSM or hernias noted. No rebound or guarding. No masses appreciated  Msk:  Symmetrical without gross deformities. Normal posture. Extremities:  +2/+3 pitting edema to BLE Neurologic:  Alert and  oriented x4;  grossly normal neurologically. Skin:  Warm and dry, intact without significant lesions.  Psych:  Alert and cooperative. Normal mood and affect.  Intake/Output from previous day: 08/01 0701 - 08/02 0700 In: 1880 [P.O.:480; I.V.:1000; IV Piggyback:400] Out: 2800 [Urine:2400; Stool:400] Intake/Output this shift: Total I/O In: 243 [P.O.:240; I.V.:3] Out: 600 [Urine:600]  Lab Results  Recent Labs     12/23/21 0604 12/24/21 0537 12/25/21 0535  WBC 16.8* 13.5* 12.5*  HGB 8.2* 8.0* 8.3*  HCT 28.1* 27.7* 29.2*  PLT 662* 638* 652*   BMET Recent Labs    12/23/21 0604 12/24/21 0537 12/25/21 0535  NA 139 139 143  K 3.5 3.5 4.0  CL 114* 115* 115*  CO2 20* 19* 22  GLUCOSE 153* 112* 124*  BUN 28* 20 15  CREATININE 1.08 0.95 0.87  CALCIUM 8.6* 8.4* 8.6*   LFT No results for input(s): "PROT", "ALBUMIN", "AST", "ALT", "ALKPHOS", "BILITOT", "BILIDIR", "IBILI" in the last 72 hours. PT/INR No results for input(s): "LABPROT", "INR" in the last 72 hours. Hepatitis Panel No results for input(s): "HEPBSAG", "HCVAB", "HEPAIGM", "HEPBIGM" in the last 72 hours.   Studies/Results CT ABDOMEN PELVIS W CONTRAST  Result Date: 12/23/2021 CLINICAL DATA:  Bowel obstruction suspected. Abdominal distention, abdominal pain, and bloating. EXAM: CT ABDOMEN AND PELVIS WITH CONTRAST TECHNIQUE: Multidetector CT imaging of the abdomen and pelvis was performed using the standard protocol following bolus administration of intravenous contrast. RADIATION DOSE REDUCTION: This exam was performed according to the departmental dose-optimization program which includes automated exposure control, adjustment of the mA and/or kV according to patient size and/or use of iterative reconstruction technique. CONTRAST:  124m OMNIPAQUE IOHEXOL 300 MG/ML  SOLN COMPARISON:  10/15/2021 FINDINGS: Lower chest: Consolidation in the lung bases, greater on the left. Minimal pleural effusions. Changes could represent compressive atelectasis or multifocal pneumonia. Small esophageal hiatal hernia. Hepatobiliary: Cholelithiasis with small stones in the gallbladder. No gallbladder wall thickening or inflammation. No bile duct dilatation. Focal low-attenuation lesion  in segment 6 of the liver measuring 4.7 cm maximal diameter. This is unchanged since previous study. This has been evaluated on previous imaging including MRI 06/19/2021 where it was  reported as consistent with post treatment ablation cavity. Pancreas: Scattered calcifications in the pancreas suggesting chronic pancreatitis. No acute inflammatory changes. Spleen: Normal in size without focal abnormality. Adrenals/Urinary Tract: Right adrenal gland nodule, 2.6 cm diameter, 31 Hounsfield units on contrast enhanced examination. No change since previous studies dating back to 02/01/2009. Long-term stability is consistent with benign etiology and no additional imaging follow-up is indicated. Kidneys, ureters, and the bladder are unremarkable. Stomach/Bowel: Stomach is decompressed. Majority of the small bowel are decompressed but there are left lower quadrant and left mid abdominal jejunal loops which are distended with air-fluid level. Suggestion of mild wall thickening. Changes could represent early phase of obstruction due to internal hernia. No pneumatosis or mesenteric stranding is noted. The colon is distended with gas and stool, most prominent in the transverse colon. This is likely ileus. A rectal catheter is present with inflated balloon. Appendix is not identified. Vascular/Lymphatic: Aortic atherosclerosis. No enlarged abdominal or pelvic lymph nodes. Reproductive: Prostate gland is surgically absent. Other: No free air or free fluid in the abdomen. Abdominal wall musculature appears intact. Musculoskeletal: Degenerative changes.  No acute bony abnormalities. IMPRESSION: 1. Consolidation in the lung bases, greater on the left, possibly pneumonia or compressive atelectasis. 2. Mildly dilated left abdominal small bowel with air-fluid levels. Proximal and distal small bowel are decompressed. Changes could represent localized ileus versus early obstruction due to internal hernia. No ischemic changes are visualized. 3. Stool-filled and gaseous distention mostly of the transverse colon, likely ileus. 4. Stable appearance of liver segment 6 ablation cavity. 5. 2.6 cm right adrenal gland nodule is  stable since 02/01/2009 suggesting benign etiology. No additional imaging follow-up is indicated. 6. Aortic atherosclerosis. Electronically Signed   By: Lucienne Capers M.D.   On: 12/23/2021 19:57   DG Abd 1 View  Result Date: 12/23/2021 CLINICAL DATA:  Increasing shortness of breath. Sepsis. Multiple underlying medical problems. EXAM: ABDOMEN - 1 VIEW COMPARISON:  Radiographs 12/21/2021 and 12/19/2021.  CT 10/15/2021. FINDINGS: 0527 hours. Four supine views of the abdomen are submitted. Diffuse dilatation of the small and large bowel is again noted, without significant change. No supine evidence of free intraperitoneal air. Postsurgical changes are noted within the pelvis. There are no suspicious abdominal calcifications. IMPRESSION: No significant change in mild diffuse bowel distension, most consistent with an ileus. No new findings. Electronically Signed   By: Richardean Sale M.D.   On: 12/23/2021 08:12   DG Abd 1 View  Result Date: 12/21/2021 CLINICAL DATA:  Follow-up ileus EXAM: ABDOMEN - 1 VIEW COMPARISON:  December 19, 2021 FINDINGS: Dilated loops of large and small bowel are similar in the interval. No interval changes. IMPRESSION: Dilated loops of large and small bowel are similar in the interval consistent with known ileus. No significant interval change. Electronically Signed   By: Dorise Bullion III M.D.   On: 12/21/2021 12:56   DG Chest 1 View  Result Date: 12/20/2021 CLINICAL DATA:  Increasing shortness of breath. EXAM: CHEST  1 VIEW COMPARISON:  [place] chest yesterday at 9:33 a.m. FINDINGS: 4:54 a.m. There is interval partial clearance of patchy atelectasis or pneumonic infiltrate from the left lower lobe. Linear scarring or atelectasis in the left mid lung continues to be seen. Remaining lungs generally clear. No pleural effusion is evident. There is aortic calcification and  tortuosity with stable mediastinum. There is mild cardiomegaly. Central vessels are normal caliber. There is  thoracic spondylosis. IMPRESSION: Improved aeration in the left lower lobe. Patchy infiltrate or atelectasis remains but is less confluent. Electronically Signed   By: Telford Nab M.D.   On: 12/20/2021 05:31   DG Abd 1 View  Result Date: 12/19/2021 CLINICAL DATA:  Ileus EXAM: ABDOMEN - 1 VIEW COMPARISON:  CT 10/15/2021 FINDINGS: Gaseous distension of large and small bowel loops throughout the abdomen. No gross free intraperitoneal air. No radio-opaque calculi or other significant radiographic abnormality are seen. IMPRESSION: Gaseous distension of large and small bowel loops throughout the abdomen, most suggestive of ileus. Electronically Signed   By: Davina Poke D.O.   On: 12/19/2021 12:03   DG CHEST PORT 1 VIEW  Result Date: 12/19/2021 CLINICAL DATA:  Cough with shortness of breath and chest pain EXAM: PORTABLE CHEST 1 VIEW COMPARISON:  Chest x-ray dated December 16, 2021 FINDINGS: Cardiac and mediastinal contours are unchanged when accounting for AP technique and lung volumes. Low lung volumes with hypoventilatory changes. Mild left basilar opacity which is likely due to atelectasis. No large pleural effusion or pneumothorax. Numerous dilated gas-filled loops of bowel are seen in the partially visualized upper abdomen. IMPRESSION: 1. Low lung volumes with hypoventilatory changes. Mild left basilar opacity which is likely due to atelectasis. 2. Numerous dilated gas-filled loops of bowel seen in the partially visualized upper abdomen, recommend clinical correlation and consider dedicated abdominal radiograph for further evaluation. Electronically Signed   By: Yetta Glassman M.D.   On: 12/19/2021 09:44   DG Chest Port 1 View  Result Date: 12/16/2021 CLINICAL DATA:  Shortness of breath EXAM: PORTABLE CHEST 1 VIEW COMPARISON:  02/28/2021 FINDINGS: Cardiac size upper limits of normal. Slight central congestion. No pleural effusion. Patchy airspace disease at the lingula and left base. No pneumothorax  IMPRESSION: Patchy airspace disease at the lingula and left base, atelectasis versus pneumonia. Electronically Signed   By: Donavan Foil M.D.   On: 12/16/2021 18:34    Assessment  81 y.o. male with a history of diabetes, GERD, HLD, HTN, morbid obesity, OSA, neuropathy, and prostate cancer who was admitted to the hospital 7/24 after presenting worsening shortness of breath.  He was diagnosed with severe sepsis due to pneumonia for which he was started on IV antibiotics and supplemental oxygen.  GI consulted for evaluation of ileus.  Ileus: New onset likely in the setting of severe sepsis.  Patient also had some recent narcotic exposure which likely aggravated his condition.  He previously had rectal tube in place and was receiving MiraLAX twice daily and electrolyte replacement for conservative management.  His electrolytes have continued to improve throughout his hospitalization.  He previously had 500 cc of stool in his rectal tube that was noted on Monday 7/31 but had not had any since then.  He previous had multiple KUBs with persistent small and large bowel dilation likely representing localized ileus versus obstruction.  He had CT A/P with IV contrast on 7/31 showing mildly dilated left abdominal small bowel with air-fluid levels, proximal distal small bowel decompressed, changes likely representing localized ileus versus obstruction due to internal hernia, no ischemic changes visualized, stool-filled and gaseous distention most of the transverse colon.  General surgery was consulted who also reviewed imaging and felt the patient did not have a significant internal hernia therefore did not need operative management.  Patient subsequently underwent decompressive flexible sigmoidoscopy yesterday 8/1 in which stool was noted  in the rectum, sigmoid colon, descending colon, and transverse colon.  Suction was performed and patient was returned back to his room.  He was given full liquid diet and MiraLAX was  increased to 3 times daily.  Today patient is feeling much better, he denies any nausea, vomiting, abdominal pain.  He reports that he feels his belly is much less distended than yesterday.  He states he has been passing gas and he had a small bowel movement this morning.  He reports he is attempting to move around in the bed but has not had good luck with physical therapy.  He does have increased bowel sounds today and is not tender on exam.  Patient reports tolerating a fair amount of his meals and would like to try more solid food.  Plan / Recommendations  Minimize use of narcotics Continue to encourage mobility Maintain potassium > 4, corrected calcium > 9 Continue miralax TID Advance diet to GI soft, low residue.    LOS: 9 days    12/25/2021, 2:28 PM   Venetia Night, MSN, FNP-BC, AGACNP-BC [facility]

## 2021-12-26 DIAGNOSIS — I10 Essential (primary) hypertension: Secondary | ICD-10-CM | POA: Diagnosis not present

## 2021-12-26 DIAGNOSIS — J189 Pneumonia, unspecified organism: Secondary | ICD-10-CM | POA: Diagnosis not present

## 2021-12-26 DIAGNOSIS — K567 Ileus, unspecified: Secondary | ICD-10-CM | POA: Diagnosis not present

## 2021-12-26 DIAGNOSIS — I480 Paroxysmal atrial fibrillation: Secondary | ICD-10-CM | POA: Diagnosis not present

## 2021-12-26 LAB — BASIC METABOLIC PANEL
Anion gap: 6 (ref 5–15)
BUN: 11 mg/dL (ref 8–23)
CO2: 21 mmol/L — ABNORMAL LOW (ref 22–32)
Calcium: 8.7 mg/dL — ABNORMAL LOW (ref 8.9–10.3)
Chloride: 114 mmol/L — ABNORMAL HIGH (ref 98–111)
Creatinine, Ser: 0.9 mg/dL (ref 0.61–1.24)
GFR, Estimated: >60 mL/min (ref >60)
Glucose, Bld: 133 mg/dL — ABNORMAL HIGH (ref 70–99)
Potassium: 3.7 mmol/L (ref 3.5–5.1)
Sodium: 141 mmol/L (ref 135–145)

## 2021-12-26 LAB — GLUCOSE, CAPILLARY
Glucose-Capillary: 153 mg/dL — ABNORMAL HIGH (ref 70–99)
Glucose-Capillary: 131 mg/dL — ABNORMAL HIGH (ref 70–99)
Glucose-Capillary: 206 mg/dL — ABNORMAL HIGH (ref 70–99)
Glucose-Capillary: 168 mg/dL — ABNORMAL HIGH (ref 70–99)
Glucose-Capillary: 158 mg/dL — ABNORMAL HIGH (ref 70–99)

## 2021-12-26 NOTE — Plan of Care (Signed)
  Problem: Education: Goal: Ability to describe self-care measures that may prevent or decrease complications (Diabetes Survival Skills Education) will improve Outcome: Progressing Goal: Individualized Educational Video(s) Outcome: Progressing   Problem: Coping: Goal: Ability to adjust to condition or change in health will improve Outcome: Progressing   Problem: Fluid Volume: Goal: Ability to maintain a balanced intake and output will improve Outcome: Progressing   Problem: Health Behavior/Discharge Planning: Goal: Ability to identify and utilize available resources and services will improve Outcome: Progressing Goal: Ability to manage health-related needs will improve Outcome: Progressing   Problem: Metabolic: Goal: Ability to maintain appropriate glucose levels will improve Outcome: Progressing   Problem: Nutritional: Goal: Maintenance of adequate nutrition will improve Outcome: Progressing Goal: Progress toward achieving an optimal weight will improve Outcome: Progressing   Problem: Skin Integrity: Goal: Risk for impaired skin integrity will decrease Outcome: Progressing   Problem: Tissue Perfusion: Goal: Adequacy of tissue perfusion will improve Outcome: Progressing   Problem: Education: Goal: Knowledge of General Education information will improve Description: Including pain rating scale, medication(s)/side effects and non-pharmacologic comfort measures Outcome: Progressing   Problem: Health Behavior/Discharge Planning: Goal: Ability to manage health-related needs will improve Outcome: Progressing   Problem: Clinical Measurements: Goal: Ability to maintain clinical measurements within normal limits will improve Outcome: Progressing Goal: Will remain free from infection Outcome: Progressing Goal: Diagnostic test results will improve Outcome: Progressing Goal: Respiratory complications will improve Outcome: Progressing Goal: Cardiovascular complication will  be avoided Outcome: Progressing   Problem: Activity: Goal: Risk for activity intolerance will decrease Outcome: Progressing   Problem: Nutrition: Goal: Adequate nutrition will be maintained Outcome: Progressing   Problem: Coping: Goal: Level of anxiety will decrease Outcome: Progressing   Problem: Elimination: Goal: Will not experience complications related to bowel motility Outcome: Progressing Goal: Will not experience complications related to urinary retention Outcome: Progressing   Problem: Pain Managment: Goal: General experience of comfort will improve Outcome: Progressing   Problem: Safety: Goal: Ability to remain free from injury will improve Outcome: Progressing   Problem: Skin Integrity: Goal: Risk for impaired skin integrity will decrease Outcome: Progressing   Problem: Activity: Goal: Ability to tolerate increased activity will improve Outcome: Progressing   Problem: Clinical Measurements: Goal: Ability to maintain a body temperature in the normal range will improve Outcome: Progressing   Problem: Respiratory: Goal: Ability to maintain adequate ventilation will improve Outcome: Progressing Goal: Ability to maintain a clear airway will improve Outcome: Progressing   

## 2021-12-26 NOTE — Progress Notes (Addendum)
Gastroenterology Progress Note   Referring Provider: No ref. provider found Primary Care Physician:  [name], MD Primary Gastroenterologist:  Dr.  Patient ID: Steve Jensen; [id number]; 1941-05-22   Subjective:    Patient states he is feeling much better.  Appetite good.  Denies abdominal pain.  Passing flatus, small stool yesterday.  Objective:   Vital signs in last 24 hours: Temp:  [97.6 F (36.4 C)-99 F (37.2 C)] 99 F (37.2 C) (08/03 0516) Pulse Rate:  [66-84] 84 (08/03 0516) Resp:  [19-20] 20 (08/03 0516) BP: (137-143)/(63-67) 139/63 (08/03 0516) SpO2:  [92 %-95 %] 95 % (08/03 0723) Last BM Date : 12/25/21 General:   Alert,  Well-developed, well-nourished, pleasant and cooperative in NAD Head:  Normocephalic and atraumatic. Eyes:  Sclera clear, no icterus.  Abdomen:  Soft, nontender and nondistended.  Obese.  Normal bowel sounds, without guarding, and without rebound.   Extremities: 2+ pitting edema to the knees bilaterally.  . Neurologic:  Alert and  oriented x4;  grossly normal neurologically. Skin:  Intact without significant lesions or rashes. Psych:  Alert and cooperative. Normal mood and affect.  Intake/Output from previous day: 08/02 0701 - 08/03 0700 In: 1764.4 [P.O.:480; I.V.:1284.4] Out: 2100 [Urine:2100] Intake/Output this shift: Total I/O In: 240 [P.O.:240] Out: 200 [Urine:200]  Lab Results: CBC Recent Labs    12/24/21 0537 12/25/21 0535  WBC 13.5* 12.5*  HGB 8.0* 8.3*  HCT 27.7* 29.2*  MCV 70.3* 71.2*  PLT 638* 652*   BMET Recent Labs    12/24/21 0537 12/25/21 0535 12/26/21 0447  NA 139 143 141  K 3.5 4.0 3.7  CL 115* 115* 114*  CO2 19* 22 21*  GLUCOSE 112* 124* 133*  BUN '20 15 11  '$ CREATININE 0.95 0.87 0.90  CALCIUM 8.4* 8.6* 8.7*   LFTs No results for input(s): "BILITOT", "BILIDIR", "IBILI", "ALKPHOS", "AST", "ALT", "PROT", "ALBUMIN" in the last 72 hours. No results for input(s): "LIPASE" in the last 72  hours. PT/INR No results for input(s): "LABPROT", "INR" in the last 72 hours.     Imaging Studies: CT ABDOMEN PELVIS W CONTRAST  Result Date: 12/23/2021 CLINICAL DATA:  Bowel obstruction suspected. Abdominal distention, abdominal pain, and bloating. EXAM: CT ABDOMEN AND PELVIS WITH CONTRAST TECHNIQUE: Multidetector CT imaging of the abdomen and pelvis was performed using the standard protocol following bolus administration of intravenous contrast. RADIATION DOSE REDUCTION: This exam was performed according to the departmental dose-optimization program which includes automated exposure control, adjustment of the mA and/or kV according to patient size and/or use of iterative reconstruction technique. CONTRAST:  151m OMNIPAQUE IOHEXOL 300 MG/ML  SOLN COMPARISON:  10/15/2021 FINDINGS: Lower chest: Consolidation in the lung bases, greater on the left. Minimal pleural effusions. Changes could represent compressive atelectasis or multifocal pneumonia. Small esophageal hiatal hernia. Hepatobiliary: Cholelithiasis with small stones in the gallbladder. No gallbladder wall thickening or inflammation. No bile duct dilatation. Focal low-attenuation lesion in segment 6 of the liver measuring 4.7 cm maximal diameter. This is unchanged since previous study. This has been evaluated on previous imaging including MRI 06/19/2021 where it was reported as consistent with post treatment ablation cavity. Pancreas: Scattered calcifications in the pancreas suggesting chronic pancreatitis. No acute inflammatory changes. Spleen: Normal in size without focal abnormality. Adrenals/Urinary Tract: Right adrenal gland nodule, 2.6 cm diameter, 31 Hounsfield units on contrast enhanced examination. No change since previous studies dating back to 02/01/2009. Long-term stability is consistent with benign etiology and no additional imaging follow-up is indicated. Kidneys,  ureters, and the bladder are unremarkable. Stomach/Bowel: Stomach is  decompressed. Majority of the small bowel are decompressed but there are left lower quadrant and left mid abdominal jejunal loops which are distended with air-fluid level. Suggestion of mild wall thickening. Changes could represent early phase of obstruction due to internal hernia. No pneumatosis or mesenteric stranding is noted. The colon is distended with gas and stool, most prominent in the transverse colon. This is likely ileus. A rectal catheter is present with inflated balloon. Appendix is not identified. Vascular/Lymphatic: Aortic atherosclerosis. No enlarged abdominal or pelvic lymph nodes. Reproductive: Prostate gland is surgically absent. Other: No free air or free fluid in the abdomen. Abdominal wall musculature appears intact. Musculoskeletal: Degenerative changes.  No acute bony abnormalities. IMPRESSION: 1. Consolidation in the lung bases, greater on the left, possibly pneumonia or compressive atelectasis. 2. Mildly dilated left abdominal small bowel with air-fluid levels. Proximal and distal small bowel are decompressed. Changes could represent localized ileus versus early obstruction due to internal hernia. No ischemic changes are visualized. 3. Stool-filled and gaseous distention mostly of the transverse colon, likely ileus. 4. Stable appearance of liver segment 6 ablation cavity. 5. 2.6 cm right adrenal gland nodule is stable since 02/01/2009 suggesting benign etiology. No additional imaging follow-up is indicated. 6. Aortic atherosclerosis. Electronically Signed   By: Lucienne Capers M.D.   On: 12/23/2021 19:57   DG Abd 1 View  Result Date: 12/23/2021 CLINICAL DATA:  Increasing shortness of breath. Sepsis. Multiple underlying medical problems. EXAM: ABDOMEN - 1 VIEW COMPARISON:  Radiographs 12/21/2021 and 12/19/2021.  CT 10/15/2021. FINDINGS: 0527 hours. Four supine views of the abdomen are submitted. Diffuse dilatation of the small and large bowel is again noted, without significant change.  No supine evidence of free intraperitoneal air. Postsurgical changes are noted within the pelvis. There are no suspicious abdominal calcifications. IMPRESSION: No significant change in mild diffuse bowel distension, most consistent with an ileus. No new findings. Electronically Signed   By: Richardean Sale M.D.   On: 12/23/2021 08:12   DG Abd 1 View  Result Date: 12/21/2021 CLINICAL DATA:  Follow-up ileus EXAM: ABDOMEN - 1 VIEW COMPARISON:  December 19, 2021 FINDINGS: Dilated loops of large and small bowel are similar in the interval. No interval changes. IMPRESSION: Dilated loops of large and small bowel are similar in the interval consistent with known ileus. No significant interval change. Electronically Signed   By: Dorise Bullion III M.D.   On: 12/21/2021 12:56   DG Chest 1 View  Result Date: 12/20/2021 CLINICAL DATA:  Increasing shortness of breath. EXAM: CHEST  1 VIEW COMPARISON:  [place] chest yesterday at 9:33 a.m. FINDINGS: 4:54 a.m. There is interval partial clearance of patchy atelectasis or pneumonic infiltrate from the left lower lobe. Linear scarring or atelectasis in the left mid lung continues to be seen. Remaining lungs generally clear. No pleural effusion is evident. There is aortic calcification and tortuosity with stable mediastinum. There is mild cardiomegaly. Central vessels are normal caliber. There is thoracic spondylosis. IMPRESSION: Improved aeration in the left lower lobe. Patchy infiltrate or atelectasis remains but is less confluent. Electronically Signed   By: Telford Nab M.D.   On: 12/20/2021 05:31   DG Abd 1 View  Result Date: 12/19/2021 CLINICAL DATA:  Ileus EXAM: ABDOMEN - 1 VIEW COMPARISON:  CT 10/15/2021 FINDINGS: Gaseous distension of large and small bowel loops throughout the abdomen. No gross free intraperitoneal air. No radio-opaque calculi or other significant radiographic abnormality are seen.  IMPRESSION: Gaseous distension of large and small bowel loops  throughout the abdomen, most suggestive of ileus. Electronically Signed   By: Davina Poke D.O.   On: 12/19/2021 12:03   DG CHEST PORT 1 VIEW  Result Date: 12/19/2021 CLINICAL DATA:  Cough with shortness of breath and chest pain EXAM: PORTABLE CHEST 1 VIEW COMPARISON:  Chest x-ray dated December 16, 2021 FINDINGS: Cardiac and mediastinal contours are unchanged when accounting for AP technique and lung volumes. Low lung volumes with hypoventilatory changes. Mild left basilar opacity which is likely due to atelectasis. No large pleural effusion or pneumothorax. Numerous dilated gas-filled loops of bowel are seen in the partially visualized upper abdomen. IMPRESSION: 1. Low lung volumes with hypoventilatory changes. Mild left basilar opacity which is likely due to atelectasis. 2. Numerous dilated gas-filled loops of bowel seen in the partially visualized upper abdomen, recommend clinical correlation and consider dedicated abdominal radiograph for further evaluation. Electronically Signed   By: Yetta Glassman M.D.   On: 12/19/2021 09:44   DG Chest Port 1 View  Result Date: 12/16/2021 CLINICAL DATA:  Shortness of breath EXAM: PORTABLE CHEST 1 VIEW COMPARISON:  02/28/2021 FINDINGS: Cardiac size upper limits of normal. Slight central congestion. No pleural effusion. Patchy airspace disease at the lingula and left base. No pneumothorax IMPRESSION: Patchy airspace disease at the lingula and left base, atelectasis versus pneumonia. Electronically Signed   By: Donavan Foil M.D.   On: 12/16/2021 18:34  [2 weeks]  Assessment:   81 year old male with [place] s/p ablation in 2020 followed by IR,diabetes, GERD, hyperlipidemia, hypertension, morbid obesity, obstructive sleep apnea, neuropathy, prostate cancer presenting to the ED July 24 for worsening shortness of breath.  He was diagnosed with severe sepsis due to pneumonia.  GI consulted for ileus.  Ileus: In the setting of severe sepsis, electrolyte abnormalities,  narcotics.  Imaging as previously outlined including KUBs, CTs showing suspected localized ileus although obstruction cannot be excluded.  General surgery consulted, did not feel like he has significant internal hernia or obstruction prompting operative intervention.  Underwent decompressive flexible sigmoidoscopy August 1.  Prior to flexible sigmoidoscopy he had rectal tube.  Last BM recorded was yesterday. Patient feeling much improved. Less abdominal distention. Appetite improved.     Plan:   Minimize use of narcotics. Continue to encourage mobility. Correct electrolytes as needed.  Continue MiraLAX 3 times daily for now, will need to back down to once or twice daily once ileus has resolved. F/u with primary GI, Dr. Earlean Shawl as outpatient. GI to sign off, call with questions.   LOS: 10 days   Laureen Ochs. Bernarda Caffey [facility] [phone] 8/3/202311:52 AM

## 2021-12-26 NOTE — Progress Notes (Signed)
Patient states he is going home tomorrow and family member took CPAP home with them today. He says he will not wear one tonight. Will place on nasal cannula for the night since he will not have CPAP. Told pt to call if he got in distress or needed a hospital unit.

## 2021-12-26 NOTE — Progress Notes (Signed)
PROGRESS NOTE    Steve Jensen  QPR:[id number] DOB: Oct 11, 1940 DOA: 12/16/2021 PCP: [name], MD   Brief Narrative:    Steve Jensen  is a 81 y.o. male reformed smoker with past medical history relevant for morbid obesity/OSA, chronic A-fib status post prior cardioversion chronically on Eliquis for stroke prophylaxis, HTN, chronic diastolic dysfunction CHF and advanced osteoarthritis as well as diabetes mellitus who presents to the ED with complaints of productive cough with colored sputum, shortness of breath and dyspnea on exertion since 12/13/2021.  Patient was admitted for severe sepsis secondary to community-acquired pneumonia, present on admission and he has now completed IV antibiotic course.  He is also noted to have associated acute hypoxemic respiratory failure.  He now has ileus for which rectal tube has been placed.  Pulmonology consulted due to ongoing hypoxemia and poor progression.   GI consulted due to ongoing ileus; s/p colonoscopy. Continue slowly advancing diet. Continue miralax TID and increase ambulation.  Assessment & Plan:   Principal Problem:   Pneumonia Active Problems:   Sepsis due to CAP/PNA   Essential hypertension, benign   Obstructive sleep apnea   Morbid obesity (HCC)   Atrial fibrillation ([place])   Uncontrolled type 2 diabetes mellitus with hyperglycemia, without long-term current use of insulin (HCC)   Osteoarthritis of right knee   Ileus (HCC)  Assessment and Plan:  Severe sepsis, present on admission, due to CAP/PNA-ongoing - Patient met criteria for severe sepsis due to pneumonia at time of admission; febrile, elevated WBCs, tachycardic/tachypneic, source of infection left lower lobe pneumonia and organ dysfunction his lungs with the presence of hypoxia requiring oxygen supplementation. -Repeat blood cultures with fever overnight and check urine cultures and chest x-ray -MRSA PCR negative -Appreciate pulmonology evaluation, now signed  off -Patient has completed 7-day course of IV antibiotics 7/30 -Will check desaturation screening.   Acute hypoxemic respiratory failure secondary to above -Currently on 2 L nasal cannula oxygen, continue to wean as tolerated and check desaturation screening. -Chest x-ray 7/27 with atelectasis and demonstrating improvement in variation. -Continue incentive spirometry and flutter valve. -Continue increasing physical activity/ambulation.     Ileus -Started rectal tube 7/27 -Encourage to increase mobility (working with PT/OT and also rotating in bed). -Completed treatment with Reglan as per PCCM recommendations. -Status post decompressive colonoscopy and at this moment successful removal of rectal tube with reports bowels consistently. -appreciate ongoing GI evaluation -Continue the use of MiraLAX. -Continue soft diet. -Maintain electrolytes stable. -Continue to increase mobility.   Osteoarthritis of right knee -Patient complaining of significant pain -Continue the use of Voltaren gel -Minimize/avoid the use of narcotic agents given ongoing ileus process. -Outpatient follow-up with orthopedic service as he has been advised of the need for knee replacement.   Uncontrolled type 2 diabetes mellitus with hyperglycemia, without long-term current use of insulin (HCC) with hyperglycemia -A1c 7.6  -Continue sliding scale insulin; NovoLog for meal coverage added. -Follow CBGs fluctuation and adjust hypoglycemic regimen as required -Continue holding oral hypoglycemic agents while inpatient. -Continue current dose of long-acting insulin.   Atrial fibrillation (HCC) -Paroxysmal in nature -Continue telemetry monitoring; currently sinus rhythm and rate controlled. -Continue the use of metoprolol and Cardizem -Continue Lovenox for secondary prevention until no further inpatient procedures anticipated.  Planning to resume the use of Eliquis by mouth at time of discharge.   Morbid obesity  ([place]) -Body mass index is 41.72 kg/m. -Low calorie diet and portion control discussed with patient.   Obstructive sleep apnea -Continue the  use of CPAP nightly -Patient demonstrating good compliance.   Essential hypertension, benign -Stable overall; follow vital signs. -Continue current antihypertensive agent -Heart healthy diet discussed with patient.   Mild hypokalemia -Goal potassium of 4.0 -Continue to follow electrolytes and further replete as needed.   DVT prophylaxis: Currently full dose Lovenox Code Status: Full Family Communication: No family at bedside during evaluation; all questions and plan of care discussed with patient directly.  (12/25/2021). Disposition Plan: Physical therapy recommending skilled nursing facility at discharge; checking desaturation screening and planning for discharge to facility on 12/27/2021.  Status is: Inpatient Remains inpatient appropriate because: Currently on IV antibiotics   Consultants:  Pulmonology GI   Procedures:  None   Antimicrobials:  Anti-infectives (From admission, onward)    Start     Dose/Rate Route Frequency Ordered Stop   12/22/21 1400  ceFEPIme (MAXIPIME) 2 g in sodium chloride 0.9 % 100 mL IVPB  Status:  Discontinued        2 g 200 mL/hr over 30 Minutes Intravenous Every 8 hours 12/22/21 0830 12/22/21 1026   12/22/21 1400  ceFEPIme (MAXIPIME) 2 g in sodium chloride 0.9 % 100 mL IVPB        2 g 200 mL/hr over 30 Minutes Intravenous Every 8 hours 12/22/21 1026 12/22/21 2159   12/22/21 1100  doxycycline (VIBRAMYCIN) 100 mg in sodium chloride 0.9 % 250 mL IVPB        100 mg 125 mL/hr over 120 Minutes Intravenous Every 12 hours 12/22/21 1026 12/23/21 0014   12/20/21 1000  doxycycline (VIBRAMYCIN) 100 mg in sodium chloride 0.9 % 250 mL IVPB  Status:  Discontinued        100 mg 125 mL/hr over 120 Minutes Intravenous Every 12 hours 12/20/21 0705 12/22/21 1026   12/19/21 0830  ceFEPIme (MAXIPIME) 2 g in sodium chloride 0.9  % 100 mL IVPB  Status:  Discontinued        2 g 200 mL/hr over 30 Minutes Intravenous Every 12 hours 12/19/21 0738 12/22/21 0830   12/17/21 0000  cefTRIAXone (ROCEPHIN) 1 g in sodium chloride 0.9 % 100 mL IVPB  Status:  Discontinued        1 g 200 mL/hr over 30 Minutes Intravenous Every 24 hours 12/16/21 2150 12/19/21 0704   12/16/21 2200  doxycycline (VIBRA-TABS) tablet 100 mg  Status:  Discontinued        100 mg Oral Every 12 hours 12/16/21 2150 12/20/21 0704   12/16/21 1945  cefTRIAXone (ROCEPHIN) 1 g in sodium chloride 0.9 % 100 mL IVPB        1 g 200 mL/hr over 30 Minutes Intravenous  Once 12/16/21 1939 12/16/21 2156   12/16/21 1945  doxycycline (VIBRA-TABS) tablet 100 mg        100 mg Oral  Once 12/16/21 1939 12/16/21 2000       Subjective: Moving bowels with the use of MiraLAX; reporting breathing to be improved requiring only 2 L nasal cannula supplementation during evaluation.  Continue to be weak and deconditioned.  No chest pain, no nausea, no vomiting.  Reports no abdominal pain.   Objective: Vitals:   12/25/21 2054 12/26/21 0516 12/26/21 0723 12/26/21 1443  BP: (!) 143/67 139/63  (!) 144/60  Pulse: 73 84  67  Resp: 20 20  (!) 22  Temp: 98.8 F (37.1 C) 99 F (37.2 C)  (!) 97.5 F (36.4 C)  TempSrc:  Oral  Oral  SpO2: 92% 95% 95% 92%  Weight:  Height:        Intake/Output Summary (Last 24 hours) at 12/26/2021 1728 Last data filed at 12/26/2021 1500 Gross per 24 hour  Intake 1761.35 ml  Output 1700 ml  Net 61.35 ml   Filed Weights   12/16/21 1524 12/17/21 0003  Weight: 131.5 kg 131.9 kg    Examination: General exam: Alert, awake, oriented x 3; reports feeling better and breathing easier.  No chest pain, no nausea, no vomiting.  Expressed moving bowels overnight again. Respiratory system: Positive scattered rhonchi; 2 L nasal cannula supplementation appreciated at time of examination.  No wheezing or crackles. Cardiovascular system: Rate controlled, no  rubs, no gallops, unable to assess JVD with body habitus. Gastrointestinal system: Abdomen is obese, less distended, with positive bowel sounds and no tenderness on palpation.  Rectal tube has been removed as of 4 moving bowels with the use of MiraLAX.   Central nervous system: Alert and oriented. No focal neurological deficits. Extremities: No cyanosis or clubbing; decreased mobility especially in his right knee secondary to osteoarthritis.  Trace edema appreciated bilaterally. Skin: No petechiae. Psychiatry: Judgement and insight appear normal.  Flat affect.   Data Reviewed: I have personally reviewed following labs and imaging studies  CBC: Recent Labs  Lab 12/21/21 0526 12/22/21 0550 12/23/21 0604 12/24/21 0537 12/25/21 0535  WBC 20.9* 16.9* 16.8* 13.5* 12.5*  HGB 8.4* 7.7* 8.2* 8.0* 8.3*  HCT 28.6* 25.8* 28.1* 27.7* 29.2*  MCV 67.9* 67.9* 70.4* 70.3* 71.2*  PLT 799* 755* 662* 638* 466*   Basic Metabolic Panel: Recent Labs  Lab 12/21/21 0526 12/22/21 0550 12/23/21 0604 12/24/21 0537 12/25/21 0535 12/26/21 0447  NA 137 138 139 139 143 141  K 3.3* 3.3* 3.5 3.5 4.0 3.7  CL 108 111 114* 115* 115* 114*  CO2 20* 21* 20* 19* 22 21*  GLUCOSE 185* 156* 153* 112* 124* 133*  BUN 57* 41* 28* _0 CREATININE 1.52* 1.19 1.08 0.95 0.87 0.90  CALCIUM 8.6* 8.2* 8.6* 8.4* 8.6* 8.7*  MG 2.4 2.2 2.2 2.1 2.2  --    GFR: Estimated Creatinine Clearance: 90.9 mL/min (by C-G formula based on SCr of 0.9 mg/dL).  Liver Function Tests: Recent Labs  Lab 12/21/21 0543  ALBUMIN 2.3*   BNP (last 3 results) Recent Labs    02/18/21 1555  PROBNP 99   CBG: Recent Labs  Lab 12/25/21 2057 12/25/21 2228 12/26/21 0714 12/26/21 1200 12/26/21 1614  GLUCAP 153* 138* 131* 206* 168*   Sepsis Labs: Recent Labs  Lab 12/20/21 0518  PROCALCITON 0.53    Recent Results (from the past 240 hour(s))  Culture, blood (routine x 2)     Status: None   Collection Time: 12/16/21  6:47 PM    Specimen: BLOOD  Result Value Ref Range Status   Specimen Description   Final    BLOOD LEFT ANTECUBITAL Performed at [facility], [place] [address] [place], [place] [address]    Special Requests   Final    BOTTLES DRAWN AEROBIC AND ANAEROBIC Blood Culture results may not be optimal due to an excessive volume of blood received in culture bottles Performed at [facility] [address], [place], [place] [address]    Culture   Final    NO GROWTH 5 DAYS Performed at [facility], [address], [place], [place] [address]    Report Status 12/21/2021 FINAL  Final  Culture, blood (routine x 2)     Status: None  Collection Time: 12/16/21  6:52 PM   Specimen: BLOOD  Result Value Ref Range Status   Specimen Description   Final    BLOOD RIGHT ANTECUBITAL Performed at [facility], [address] [place], [place] [address]    Special Requests   Final    BOTTLES DRAWN AEROBIC AND ANAEROBIC Blood Culture adequate volume Performed at [facility], [address] [place], [place] [address]    Culture   Final    NO GROWTH 5 DAYS Performed at [facility], [address] [place], [place] [address]    Report Status 12/21/2021 FINAL  Final  SARS Coronavirus 2 by RT PCR (hospital order, performed in [place] hospital lab) *cepheid single result test* Anterior Nasal Swab     Status: None   Collection Time: 12/16/21  8:07 PM   Specimen: Anterior Nasal Swab  Result Value Ref Range Status   SARS Coronavirus 2 by RT PCR NEGATIVE NEGATIVE Final    Comment: (NOTE) SARS-CoV-2 target nucleic acids are NOT DETECTED.  The SARS-CoV-2 RNA is generally detectable in upper and lower respiratory specimens during the acute phase of infection. The lowest concentration of SARS-CoV-2 viral copies this assay can detect is 250 copies / mL. A negative result does not preclude SARS-CoV-2 infection and should not be used as the sole basis for treatment or other patient management  decisions.  A negative result may occur with improper specimen collection / handling, submission of specimen other than nasopharyngeal swab, presence of viral mutation(s) within the areas targeted by this assay, and inadequate number of viral copies (<250 copies / mL). A negative result must be combined with clinical observations, patient history, and epidemiological information.  Fact Sheet for Patients:   https://www.fda.gov/media/158405/download  Fact Sheet for Healthcare Providers: https://www.fda.gov/media/158404/download  This test is not yet approved or  cleared by the [place] FDA and has been authorized for detection and/or diagnosis of SARS-CoV-2 by FDA under an Emergency Use Authorization (EUA).  This EUA will remain in effect (meaning this test can be used) for the duration of the COVID-19 declaration under Section 564(b)(1) of the Act, 21 U.S.C. section 360bbb-3(b)(1), unless the authorization is terminated or revoked sooner.  Performed at [facility], [address] [place], [place] [address]   MRSA Next Gen by PCR, Nasal     Status: None   Collection Time: 12/19/21  7:05 AM   Specimen: Nasal Mucosa; Nasal Swab  Result Value Ref Range Status   MRSA by PCR Next Gen NOT DETECTED NOT DETECTED Final    Comment: (NOTE) The GeneXpert MRSA Assay (FDA approved for NASAL specimens only), is one component of a comprehensive MRSA colonization surveillance program. It is not intended to diagnose MRSA infection nor to guide or monitor treatment for MRSA infections. Test performance is not FDA approved in patients less than 2 years old. Performed at [facility], [address] [place], [place] [address]   Culture, blood (Routine X 2) w Reflex to ID Panel     Status: None   Collection Time: 12/19/21  7:11 AM   Specimen: BLOOD RIGHT HAND  Result Value Ref Range Status   Specimen Description BLOOD RIGHT HAND  Final   Special Requests   Final    BOTTLES DRAWN AEROBIC AND  ANAEROBIC Blood Culture adequate volume   Culture   Final    NO GROWTH 5 DAYS Performed at [facility], [address] [place],  [address]    Report   Status 12/24/2021 FINAL  Final  Culture, blood (Routine X 2) w Reflex to ID Panel     Status: None   Collection Time: 12/19/21  7:12 AM   Specimen: Right Antecubital; Blood  Result Value Ref Range Status   Specimen Description RIGHT ANTECUBITAL  Final   Special Requests   Final    BOTTLES DRAWN AEROBIC AND ANAEROBIC Blood Culture adequate volume   Culture   Final    NO GROWTH 5 DAYS Performed at [facility], [address], [place], [place] [address]    Report Status 12/24/2021 FINAL  Final  Urine Culture     Status: None   Collection Time: 12/24/21  4:44 AM   Specimen: Urine, Clean Catch  Result Value Ref Range Status   Specimen Description   Final    URINE, CLEAN CATCH Performed at [facility], [address], [place], [place] [address]    Special Requests   Final    NONE Performed at [facility], [address], [place], [place] [address]    Culture   Final    NO GROWTH Performed at [facility], [place] [address], [place], [place] [address]    Report Status 12/25/2021 FINAL  Final     Radiology Studies: No results found.  Scheduled Meds:  benzonatate  100 mg Oral TID   bisacodyl  10 mg Rectal Once   budesonide (PULMICORT) nebulizer solution  0.5 mg Nebulization BID   dextromethorphan-guaiFENesin  1 tablet Oral BID   diclofenac Sodium  4 g Topical QID   diltiazem  240 mg Oral Daily   enoxaparin (LOVENOX) injection  130 mg Subcutaneous Q12H   insulin aspart  0-5 Units Subcutaneous QHS   insulin aspart  0-6 Units Subcutaneous TID WC   insulin aspart  4 Units Subcutaneous TID WC   insulin glargine-yfgn  10 Units Subcutaneous Daily   metoprolol succinate  50 mg Oral Daily   pantoprazole (PROTONIX) IV  40 mg Intravenous Q24H   polyethylene glycol  17 g Oral TID   pregabalin  100 mg Oral BID   sodium  chloride flush  3 mL Intravenous Q12H   sodium chloride flush  3 mL Intravenous Q12H   Continuous Infusions:  sodium chloride     lactated ringers 50 mL/hr at 12/26/21 0551     LOS: 10 days    Barton Dubois, MD [facility]  If 7PM-7AM, please contact night-coverage [email or web address] 12/26/2021, 5:28 PM

## 2021-12-26 NOTE — TOC Progression Note (Addendum)
Transition of Care [facility]) - Progression Note    Patient Details  Name: Steve Jensen MRN: [id number] Date of Birth: [birth year]  [facility] CM/SW Contact  Boneta Lucks, RN Phone Number: 12/26/2021, 12:52 PM  Clinical Narrative:   [facility] made a bed offer. CM discussed with [name], she is agreeable.  Insurance authorization started. Family aware to take CPAP to SNF at discharge.   Addendum : INS AUTH received, MD wants to monitor another night. Updated Kitty at [place]. Plan to Discharge tomorrow.   Expected Discharge Plan: Skilled Nursing Facility Barriers to Discharge: Continued Medical Work up, Ship broker  Expected Discharge Plan and Services Expected Discharge Plan: [place] In-house Referral: Clinical Social Work Discharge Planning Services: CM Consult Post Acute Care Choice: Museum/gallery conservator, [place] Living arrangements for the past 2 months: Single Family Home                   Readmission Risk Interventions    12/26/2021   12:51 PM 12/25/2021    3:31 PM 12/19/2021   12:39 PM  Readmission Risk Prevention Plan  Transportation Screening  Complete Complete  PCP or Specialist Appt within 5-7 Days Complete    Home Care Screening  Complete   Medication Review (RN CM)  Complete   HRI or Home Care Consult   Complete  Social Work Consult for [place] Planning/Counseling   Complete  Palliative Care Screening   Not Applicable  Medication Review Press photographer)   Complete

## 2021-12-27 ENCOUNTER — Encounter (HOSPITAL_COMMUNITY): Payer: Self-pay | Admitting: Gastroenterology

## 2021-12-27 DIAGNOSIS — E782 Mixed hyperlipidemia: Secondary | ICD-10-CM | POA: Diagnosis not present

## 2021-12-27 DIAGNOSIS — Z7401 Bed confinement status: Secondary | ICD-10-CM | POA: Diagnosis not present

## 2021-12-27 DIAGNOSIS — K567 Ileus, unspecified: Secondary | ICD-10-CM | POA: Diagnosis not present

## 2021-12-27 DIAGNOSIS — Z719 Counseling, unspecified: Secondary | ICD-10-CM | POA: Diagnosis not present

## 2021-12-27 DIAGNOSIS — J9601 Acute respiratory failure with hypoxia: Secondary | ICD-10-CM | POA: Diagnosis not present

## 2021-12-27 DIAGNOSIS — Z741 Need for assistance with personal care: Secondary | ICD-10-CM | POA: Diagnosis not present

## 2021-12-27 DIAGNOSIS — Z743 Need for continuous supervision: Secondary | ICD-10-CM | POA: Diagnosis not present

## 2021-12-27 DIAGNOSIS — I480 Paroxysmal atrial fibrillation: Secondary | ICD-10-CM | POA: Diagnosis not present

## 2021-12-27 DIAGNOSIS — G4733 Obstructive sleep apnea (adult) (pediatric): Secondary | ICD-10-CM | POA: Diagnosis not present

## 2021-12-27 DIAGNOSIS — I5032 Chronic diastolic (congestive) heart failure: Secondary | ICD-10-CM | POA: Diagnosis not present

## 2021-12-27 DIAGNOSIS — M170 Bilateral primary osteoarthritis of knee: Secondary | ICD-10-CM | POA: Diagnosis not present

## 2021-12-27 DIAGNOSIS — E1165 Type 2 diabetes mellitus with hyperglycemia: Secondary | ICD-10-CM | POA: Diagnosis not present

## 2021-12-27 DIAGNOSIS — R262 Difficulty in walking, not elsewhere classified: Secondary | ICD-10-CM | POA: Diagnosis not present

## 2021-12-27 DIAGNOSIS — Z8616 Personal history of COVID-19: Secondary | ICD-10-CM | POA: Diagnosis not present

## 2021-12-27 DIAGNOSIS — M6259 Muscle wasting and atrophy, not elsewhere classified, multiple sites: Secondary | ICD-10-CM | POA: Diagnosis not present

## 2021-12-27 DIAGNOSIS — I10 Essential (primary) hypertension: Secondary | ICD-10-CM | POA: Diagnosis not present

## 2021-12-27 DIAGNOSIS — J189 Pneumonia, unspecified organism: Secondary | ICD-10-CM | POA: Diagnosis not present

## 2021-12-27 DIAGNOSIS — M1711 Unilateral primary osteoarthritis, right knee: Secondary | ICD-10-CM | POA: Diagnosis not present

## 2021-12-27 DIAGNOSIS — M6281 Muscle weakness (generalized): Secondary | ICD-10-CM | POA: Diagnosis not present

## 2021-12-27 DIAGNOSIS — I4891 Unspecified atrial fibrillation: Secondary | ICD-10-CM | POA: Diagnosis not present

## 2021-12-27 DIAGNOSIS — R1312 Dysphagia, oropharyngeal phase: Secondary | ICD-10-CM | POA: Diagnosis not present

## 2021-12-27 DIAGNOSIS — U071 COVID-19: Secondary | ICD-10-CM | POA: Diagnosis not present

## 2021-12-27 DIAGNOSIS — R279 Unspecified lack of coordination: Secondary | ICD-10-CM | POA: Diagnosis not present

## 2021-12-27 DIAGNOSIS — R52 Pain, unspecified: Secondary | ICD-10-CM | POA: Diagnosis not present

## 2021-12-27 DIAGNOSIS — A419 Sepsis, unspecified organism: Secondary | ICD-10-CM | POA: Diagnosis not present

## 2021-12-27 DIAGNOSIS — R2689 Other abnormalities of gait and mobility: Secondary | ICD-10-CM | POA: Diagnosis not present

## 2021-12-27 LAB — GLUCOSE, CAPILLARY: Glucose-Capillary: 140 mg/dL — ABNORMAL HIGH (ref 70–99)

## 2021-12-27 MED ORDER — DICLOFENAC SODIUM 1 % EX GEL: 4.0000 g | Freq: Four times a day (QID) | CUTANEOUS | Status: DC

## 2021-12-27 MED ORDER — COMBIVENT RESPIMAT 20-100 MCG/ACT IN AERS
1.0000 | INHALATION_SPRAY | Freq: Four times a day (QID) | RESPIRATORY_TRACT | Status: DC | PRN
Start: 2021-12-27 — End: 2022-03-11

## 2021-12-27 MED ORDER — BUDESONIDE-FORMOTEROL FUMARATE 80-4.5 MCG/ACT IN AERO: 2.0000 | INHALATION_SPRAY | Freq: Two times a day (BID) | RESPIRATORY_TRACT | Status: DC

## 2021-12-27 MED ORDER — BISACODYL 10 MG RE SUPP: 10.0000 mg | Freq: Every day | RECTAL | Status: DC | PRN

## 2021-12-27 MED ORDER — DM-GUAIFENESIN ER 30-600 MG PO TB12: 1.0000 | ORAL_TABLET | Freq: Two times a day (BID) | ORAL | Status: AC

## 2021-12-27 MED ORDER — ACETAMINOPHEN 500 MG PO TABS
1000.0000 mg | ORAL_TABLET | Freq: Three times a day (TID) | ORAL | Status: DC | PRN
Start: 2021-12-27 — End: 2023-09-04

## 2021-12-27 MED ORDER — OMEPRAZOLE 20 MG PO CPDR: 40.0000 mg | DELAYED_RELEASE_CAPSULE | Freq: Two times a day (BID) | ORAL | Status: DC

## 2021-12-27 MED ORDER — FUROSEMIDE 40 MG PO TABS
40.0000 mg | ORAL_TABLET | Freq: Every day | ORAL | Status: DC | PRN
Start: 2021-12-27 — End: 2022-11-15

## 2021-12-27 MED ORDER — TRAMADOL HCL 50 MG PO TABS: 50.0000 mg | ORAL_TABLET | Freq: Three times a day (TID) | ORAL | 0 refills | Status: DC | PRN

## 2021-12-27 MED ORDER — POLYETHYLENE GLYCOL 3350 17 G PO PACK: 17.0000 g | PACK | Freq: Three times a day (TID) | ORAL | Status: DC

## 2021-12-27 MED ORDER — LOSARTAN POTASSIUM 25 MG PO TABS: 12.5000 mg | ORAL_TABLET | Freq: Every day | ORAL | Status: DC

## 2021-12-27 NOTE — Discharge Summary (Signed)
Physician Discharge Summary   Patient: Steve Jensen MRN: [id number] DOB: [birth year]  Admit date:     12/16/2021  Discharge date: 12/27/21  Discharge Physician: Barton Dubois   PCP: [name], MD   Recommendations at discharge:  Repeat chest x-ray in 6-8 weeks to assure complete resolution of infiltrate Repeat basic metabolic panel to follow ultralights and renal function Reassess blood pressure and adjust antihypertensive regimen as needed Continue assisting patient with pain management of severe osteoarthritis with instruction to follow-up with orthopedic service as an outpatient. Continue assisting patient with weight management.   Discharge Diagnoses: Principal Problem:   Community acquired pneumonia of left lower lobe of lung Active Problems:   Sepsis due to CAP/PNA   Essential hypertension, benign   Obstructive sleep apnea   Morbid obesity ([place])   Atrial fibrillation ([place])   Uncontrolled type 2 diabetes mellitus with hyperglycemia, without long-term current use of insulin (HCC)   Osteoarthritis of right knee   Ileus [facility])   Brief Hospital Course:   Steve Jensen  is a 81 y.o. male reformed smoker with past medical history relevant for morbid obesity/OSA, chronic A-fib status post prior cardioversion chronically on Eliquis for stroke prophylaxis, HTN, chronic diastolic dysfunction CHF and advanced osteoarthritis as well as diabetes mellitus who presents to the ED with complaints of productive cough with colored sputum, shortness of breath and dyspnea on exertion since 12/13/2021.  Patient was admitted for severe sepsis secondary to community-acquired pneumonia, present on admission and he has now completed IV antibiotic course.  He is also noted to have associated acute hypoxemic respiratory failure.  He now has ileus for which rectal tube has been placed.  Pulmonology consulted due to ongoing hypoxemia and poor progression.   GI consulted due to ongoing ileus; s/p  colonoscopy. Continue slowly advancing diet. Continue miralax TID and increase ambulation.  Assessment and Plan: Severe sepsis, present on admission, due to CAP/PNA-ongoing - Patient met criteria for severe sepsis due to pneumonia at time of admission; febrile, elevated WBCs, tachycardic/tachypneic, source of infection left lower lobe pneumonia and organ dysfunction his lungs with the presence of hypoxia requiring oxygen supplementation. -Blood cultures without growth; patient hemodynamically stable and with resolved sepsis features of discharge. -MRSA PCR negative -Appreciate pulmonology evaluation and recommendations. -Patient has completed 7-day course of IV antibiotics 7/30 -Desaturation screening has demonstrated no need for oxygen supplementation at discharge. -Outpatient PFTs recommended; continue as needed bronchodilator management. -Repeat chest x-ray in 6-8 weeks to assure complete resolution of infiltrates and pulmonary changes.   Acute hypoxemic respiratory failure secondary to above -No requiring oxygen supplementation at discharge and demonstrating good saturation on room air. -Chest x-ray 7/27 with atelectasis and demonstrating improvement in aeration. -Continue incentive spirometry and flutter valve. -Continue increasing physical activity/ambulation.     Ileus -Started rectal tube 7/27 -Encourage to increase mobility (working with PT/OT and also rotating in bed). -Completed treatment with Reglan as per PCCM recommendations. -Status post decompressive colonoscopy and at this moment successful removal of rectal tube with reports of moving his bowels bowels consistently. -appreciate GI evaluation and recommendations. -Continue the use of MiraLAX (currently 3 times a day's).. -Continue soft diet and maintain adequate hydration. -Maintain electrolytes stable (goal is for potassium above 4). -Continue to increase mobility. -As needed use of Dulcolax suppository for moderate  constipation prescribed.   Osteoarthritis of right knee -Patient complaining of significant pain -Continue the use of Voltaren gel, Tylenol and for severe discomfort the use of tramadol. -Minimize/avoid the use  of narcotic agents given ongoing ileus process. -Outpatient follow-up with orthopedic service as he has been advised of the need for knee replacement.   Uncontrolled type 2 diabetes mellitus with hyperglycemia, without long-term current use of insulin (HCC) with hyperglycemia -A1c 7.6  -Resume home hypoglycemic regimen -Continue to follow CBGs fluctuation with further adjustment to hypoglycemic agents as needed.  Presumed chronic diastolic heart failure in the setting of arrhythmia -Compensated -Continue as needed Lasix for fluid buildup/edema -Continue outpatient follow-up with cardiology service -Follow heart healthy/low-sodium diet and check weight on daily basis.   Atrial fibrillation (HCC) -Paroxysmal in nature -Continue the use of metoprolol and Cardizem -Resume Eliquis for secondary prevention. -Continue outpatient follow-up with cardiology service.   Morbid obesity ([place]) -Body mass index is 41.72 kg/m. -Low calorie diet and portion control discussed with patient.   Obstructive sleep apnea -Continue the use of CPAP nightly -Patient demonstrating good compliance.   Essential hypertension, benign -Stable overall -Heart healthy diet discussed with patient. -Continue current antihypertensive agent   Mild hypokalemia -Goal potassium of 4.0 -Continue to follow electrolytes and further replete as needed.  Consultants: PCCM and gastroenterology service. Procedures performed: See below for x-ray reports.  Decompressive colonoscopy. Disposition: Skilled nursing facility for short-term rehabilitation. Diet recommendation: Heart healthy/modified carbohydrate; low calorie diet also encouraged to assist with weight management.  DISCHARGE MEDICATION: Allergies as of  12/27/2021       Reactions   Lipitor [atorvastatin] Other (See Comments)   Muscle aches   Simvastatin Other (See Comments)   Muscle aches   Zithromax [azithromycin] Other (See Comments)   GI-SIDE EFFECTS   Amlodipine Other (See Comments)        Medication List     STOP taking these medications    CINNAMON PO   ibuprofen 200 MG tablet Commonly known as: ADVIL   oxymetazoline 0.05 % nasal spray Commonly known as: AFRIN   ramipril 10 MG capsule Commonly known as: ALTACE       TAKE these medications    acetaminophen 500 MG tablet Commonly known as: TYLENOL Take 2 tablets (1,000 mg total) by mouth every 8 (eight) hours as needed for mild pain or headache (or Fever >/= 101).   allopurinol 100 MG tablet Commonly known as: ZYLOPRIM Take 100 mg by mouth daily.   apixaban 5 MG Tabs tablet Commonly known as: Eliquis TAKE 1 TABLET(5 MG) BY MOUTH TWICE DAILY   bisacodyl 10 MG suppository Commonly known as: DULCOLAX Place 1 suppository (10 mg total) rectally daily as needed for moderate constipation.   budesonide-formoterol 80-4.5 MCG/ACT inhaler Commonly known as: Symbicort Inhale 2 puffs into the lungs in the morning and at bedtime.   Combivent Respimat 20-100 MCG/ACT Aers respimat Generic drug: Ipratropium-Albuterol Inhale 1 puff into the lungs every 6 (six) hours as needed for wheezing or shortness of breath.   dextromethorphan-guaiFENesin 30-600 MG 12hr tablet Commonly known as: MUCINEX DM Take 1 tablet by mouth 2 (two) times daily for 10 days.   diclofenac Sodium 1 % Gel Commonly known as: VOLTAREN Apply 4 g topically 4 (four) times daily.   diltiazem 240 MG 24 hr capsule Commonly known as: CARDIZEM CD TAKE 1 CAPSULE BY MOUTH  DAILY   fish oil-omega-3 fatty acids 1000 MG capsule Take 2 g by mouth 2 (two) times daily.   furosemide 40 MG tablet Commonly known as: LASIX Take 1 tablet (40 mg total) by mouth daily as needed for fluid or edema. What  changed:  when to take  this reasons to take this   loratadine 10 MG tablet Commonly known as: CLARITIN Take 10 mg by mouth daily as needed for allergies.   losartan 25 MG tablet Commonly known as: Cozaar Take 0.5 tablets (12.5 mg total) by mouth daily.   metFORMIN 1000 MG tablet Commonly known as: GLUCOPHAGE Take 1,000 mg by mouth 2 (two) times daily.   metoprolol succinate 50 MG 24 hr tablet Commonly known as: TOPROL-XL Take 50 mg by mouth daily.   Mounjaro 5 MG/0.5ML Pen Generic drug: tirzepatide Inject 5 mg into the skin once a week.   multivitamin with minerals tablet Take 1 tablet by mouth daily.   omeprazole 20 MG capsule Commonly known as: PRILOSEC Take 2 capsules (40 mg total) by mouth 2 (two) times daily before a meal. What changed:  how much to take when to take this   polyethylene glycol 17 g packet Commonly known as: MIRALAX / GLYCOLAX Take 17 g by mouth 3 (three) times daily. Titrate down to PRN base on further resolution of his ileus.   potassium chloride SA 20 MEQ tablet Commonly known as: KLOR-CON M TAKE 1 TABLET BY MOUTH  DAILY AS NEEDED WHILE  TAKING FUROSEMIDE What changed: See the new instructions.   pregabalin 100 MG capsule Commonly known as: LYRICA Take 100 mg by mouth 2 (two) times a day.   traMADol 50 MG tablet Commonly known as: ULTRAM Take 1 tablet (50 mg total) by mouth every 8 (eight) hours as needed for severe pain.   Zetia 10 MG tablet Generic drug: ezetimibe Take 10 mg by mouth daily.               Durable Medical Equipment  (From admission, onward)           Start     Ordered   12/23/21 1227  For home use only DME Tub bench  Once       Comments: Transfer bench   12/23/21 1226   12/23/21 1111  For home use only DME 3 n 1  Once       Comments: Patient will need heavy duty 3N1 due to body habitus.   12/23/21 1112            Contact information for follow-up providers     [name], MD. Schedule an  appointment as soon as possible for a visit in 10 day(s).   Specialty: Family Medicine Why: After discharge from SNF. Contact information: [address] [place] [address] [phone]         [name], MD .   Specialties: Cardiology, Radiology, Interventional Cardiology Contact information: [address] [address] [phone]              Contact information for after-discharge care     Destination     [facility] Preferred SNF .   Service: Skilled Nursing Contact information: [address] [place] [phone]                    Discharge Exam: Danley Danker Weights   12/16/21 1524 12/17/21 0003  Weight: 131.5 kg 131.9 kg   General exam: Alert, awake, oriented x 3; reporting good tolerance to diet, moving bowels and breathing back to his baseline.  Feeling ready to pursuit rehabilitation at a skilled nursing facility.  Patient is afebrile.  Not requiring oxygen supplementation. Respiratory system: Scattered rhonchi appreciated bilaterally; no  using accessory muscle.  Good saturation on room air. Cardiovascular system: Rate controlled, currently sinus rhythm appreciated.  No rubs, no gallops, unable to properly assess JVD with body habitus. Gastrointestinal system: Abdomen is obese, nondistended, soft and nontender. No organomegaly or masses felt. Normal bowel sounds heard. Central nervous system: Alert and oriented. No focal neurological deficits. Extremities: No cyanosis or clubbing; trace pedal edema appreciated bilaterally. Skin: No petechiae. Psychiatry: Judgement and insight appear normal. Mood & affect appropriate.    Condition at discharge: Stable and improved.  The results of significant diagnostics from this hospitalization (including imaging, microbiology, ancillary and laboratory) are listed below for reference.   Imaging Studies: CT  ABDOMEN PELVIS W CONTRAST  Result Date: 12/23/2021 CLINICAL DATA:  Bowel obstruction suspected. Abdominal distention, abdominal pain, and bloating. EXAM: CT ABDOMEN AND PELVIS WITH CONTRAST TECHNIQUE: Multidetector CT imaging of the abdomen and pelvis was performed using the standard protocol following bolus administration of intravenous contrast. RADIATION DOSE REDUCTION: This exam was performed according to the departmental dose-optimization program which includes automated exposure control, adjustment of the mA and/or kV according to patient size and/or use of iterative reconstruction technique. CONTRAST:  175m OMNIPAQUE IOHEXOL 300 MG/ML  SOLN COMPARISON:  10/15/2021 FINDINGS: Lower chest: Consolidation in the lung bases, greater on the left. Minimal pleural effusions. Changes could represent compressive atelectasis or multifocal pneumonia. Small esophageal hiatal hernia. Hepatobiliary: Cholelithiasis with small stones in the gallbladder. No gallbladder wall thickening or inflammation. No bile duct dilatation. Focal low-attenuation lesion in segment 6 of the liver measuring 4.7 cm maximal diameter. This is unchanged since previous study. This has been evaluated on previous imaging including MRI 06/19/2021 where it was reported as consistent with post treatment ablation cavity. Pancreas: Scattered calcifications in the pancreas suggesting chronic pancreatitis. No acute inflammatory changes. Spleen: Normal in size without focal abnormality. Adrenals/Urinary Tract: Right adrenal gland nodule, 2.6 cm diameter, 31 Hounsfield units on contrast enhanced examination. No change since previous studies dating back to 02/01/2009. Long-term stability is consistent with benign etiology and no additional imaging follow-up is indicated. Kidneys, ureters, and the bladder are unremarkable. Stomach/Bowel: Stomach is decompressed. Majority of the small bowel are decompressed but there are left lower quadrant and left mid  abdominal jejunal loops which are distended with air-fluid level. Suggestion of mild wall thickening. Changes could represent early phase of obstruction due to internal hernia. No pneumatosis or mesenteric stranding is noted. The colon is distended with gas and stool, most prominent in the transverse colon. This is likely ileus. A rectal catheter is present with inflated balloon. Appendix is not identified. Vascular/Lymphatic: Aortic atherosclerosis. No enlarged abdominal or pelvic lymph nodes. Reproductive: Prostate gland is surgically absent. Other: No free air or free fluid in the abdomen. Abdominal wall musculature appears intact. Musculoskeletal: Degenerative changes.  No acute bony abnormalities. IMPRESSION: 1. Consolidation in the lung bases, greater on the left, possibly pneumonia or compressive atelectasis. 2. Mildly dilated left abdominal small bowel with air-fluid levels. Proximal and distal small bowel are decompressed. Changes could represent localized ileus versus early obstruction due to internal hernia. No ischemic changes are visualized. 3. Stool-filled and gaseous distention mostly of the transverse colon, likely ileus. 4. Stable appearance of liver segment 6 ablation cavity. 5. 2.6 cm right adrenal gland nodule is stable since 02/01/2009 suggesting benign etiology. No additional imaging follow-up is indicated. 6. Aortic atherosclerosis. Electronically Signed   By: WLucienne CapersM.D.   On: 12/23/2021 19:57   DG Abd 1 View  Result  Date: 12/23/2021 CLINICAL DATA:  Increasing shortness of breath. Sepsis. Multiple underlying medical problems. EXAM: ABDOMEN - 1 VIEW COMPARISON:  Radiographs 12/21/2021 and 12/19/2021.  CT 10/15/2021. FINDINGS: 0527 hours. Four supine views of the abdomen are submitted. Diffuse dilatation of the small and large bowel is again noted, without significant change. No supine evidence of free intraperitoneal air. Postsurgical changes are noted within the pelvis. There  are no suspicious abdominal calcifications. IMPRESSION: No significant change in mild diffuse bowel distension, most consistent with an ileus. No new findings. Electronically Signed   By: Richardean Sale M.D.   On: 12/23/2021 08:12   DG Abd 1 View  Result Date: 12/21/2021 CLINICAL DATA:  Follow-up ileus EXAM: ABDOMEN - 1 VIEW COMPARISON:  December 19, 2021 FINDINGS: Dilated loops of large and small bowel are similar in the interval. No interval changes. IMPRESSION: Dilated loops of large and small bowel are similar in the interval consistent with known ileus. No significant interval change. Electronically Signed   By: Dorise Bullion III M.D.   On: 12/21/2021 12:56   DG Chest 1 View  Result Date: 12/20/2021 CLINICAL DATA:  Increasing shortness of breath. EXAM: CHEST  1 VIEW COMPARISON:  [place] chest yesterday at 9:33 a.m. FINDINGS: 4:54 a.m. There is interval partial clearance of patchy atelectasis or pneumonic infiltrate from the left lower lobe. Linear scarring or atelectasis in the left mid lung continues to be seen. Remaining lungs generally clear. No pleural effusion is evident. There is aortic calcification and tortuosity with stable mediastinum. There is mild cardiomegaly. Central vessels are normal caliber. There is thoracic spondylosis. IMPRESSION: Improved aeration in the left lower lobe. Patchy infiltrate or atelectasis remains but is less confluent. Electronically Signed   By: Telford Nab M.D.   On: 12/20/2021 05:31   DG Abd 1 View  Result Date: 12/19/2021 CLINICAL DATA:  Ileus EXAM: ABDOMEN - 1 VIEW COMPARISON:  CT 10/15/2021 FINDINGS: Gaseous distension of large and small bowel loops throughout the abdomen. No gross free intraperitoneal air. No radio-opaque calculi or other significant radiographic abnormality are seen. IMPRESSION: Gaseous distension of large and small bowel loops throughout the abdomen, most suggestive of ileus. Electronically Signed   By: Davina Poke D.O.   On:  12/19/2021 12:03   DG CHEST PORT 1 VIEW  Result Date: 12/19/2021 CLINICAL DATA:  Cough with shortness of breath and chest pain EXAM: PORTABLE CHEST 1 VIEW COMPARISON:  Chest x-ray dated December 16, 2021 FINDINGS: Cardiac and mediastinal contours are unchanged when accounting for AP technique and lung volumes. Low lung volumes with hypoventilatory changes. Mild left basilar opacity which is likely due to atelectasis. No large pleural effusion or pneumothorax. Numerous dilated gas-filled loops of bowel are seen in the partially visualized upper abdomen. IMPRESSION: 1. Low lung volumes with hypoventilatory changes. Mild left basilar opacity which is likely due to atelectasis. 2. Numerous dilated gas-filled loops of bowel seen in the partially visualized upper abdomen, recommend clinical correlation and consider dedicated abdominal radiograph for further evaluation. Electronically Signed   By: Yetta Glassman M.D.   On: 12/19/2021 09:44   DG Chest Port 1 View  Result Date: 12/16/2021 CLINICAL DATA:  Shortness of breath EXAM: PORTABLE CHEST 1 VIEW COMPARISON:  02/28/2021 FINDINGS: Cardiac size upper limits of normal. Slight central congestion. No pleural effusion. Patchy airspace disease at the lingula and left base. No pneumothorax IMPRESSION: Patchy airspace disease at the lingula and left base, atelectasis versus pneumonia. Electronically Signed   By: Donavan Foil  M.D.   On: 12/16/2021 18:34    Microbiology: Results for orders placed or performed during the hospital encounter of 12/16/21  Culture, blood (routine x 2)     Status: None   Collection Time: 12/16/21  6:47 PM   Specimen: BLOOD  Result Value Ref Range Status   Specimen Description   Final    BLOOD LEFT ANTECUBITAL Performed at [facility], [place] [address], [place], [place] [address]    Special Requests   Final    BOTTLES DRAWN AEROBIC AND ANAEROBIC Blood Culture results may not be optimal due to an excessive volume of blood received  in culture bottles Performed at [place] [address] [place], [place] [address]    Culture   Final    NO GROWTH 5 DAYS Performed at [facility], [address], [place], [place] [address]    Report Status 12/21/2021 FINAL  Final  Culture, blood (routine x 2)     Status: None   Collection Time: 12/16/21  6:52 PM   Specimen: BLOOD  Result Value Ref Range Status   Specimen Description   Final    BLOOD RIGHT ANTECUBITAL Performed at [facility], [place] [address], [place], [place] [address]    Special Requests   Final    BOTTLES DRAWN AEROBIC AND ANAEROBIC Blood Culture adequate volume Performed at [facility], [place] [address] [place], [place] [address]    Culture   Final    NO GROWTH 5 DAYS Performed at [facility], [address] [place], [place] [address]    Report Status 12/21/2021 FINAL  Final  SARS Coronavirus 2 by RT PCR (hospital order, performed in [facility] hospital lab) *cepheid single result test* Anterior Nasal Swab     Status: None   Collection Time: 12/16/21  8:07 PM   Specimen: Anterior Nasal Swab  Result Value Ref Range Status   SARS Coronavirus 2 by RT PCR NEGATIVE NEGATIVE Final    Comment: (NOTE) SARS-CoV-2 target nucleic acids are NOT DETECTED.  The SARS-CoV-2 RNA is generally detectable in upper and lower respiratory specimens during the acute phase of infection. The lowest concentration of SARS-CoV-2 viral copies this assay can detect is 250 copies / mL. A negative result does not preclude SARS-CoV-2 infection and should not be used as the sole basis for treatment or other patient management decisions.  A negative result may occur with improper specimen collection / handling, submission of specimen other than nasopharyngeal swab, presence of viral mutation(s) within the areas targeted by this assay, and inadequate number of viral copies (<250 copies / mL). A negative result must be combined with clinical observations, patient  history, and epidemiological information.  Fact Sheet for Patients:   https://www.patel.info/  Fact Sheet for Healthcare Providers: https://hall.com/  This test is not yet approved or  cleared by the [place] FDA and has been authorized for detection and/or diagnosis of SARS-CoV-2 by FDA under an Emergency Use Authorization (EUA).  This EUA will remain in effect (meaning this test can be used) for the duration of the COVID-19 declaration under Section 564(b)(1) of the Act, 21 U.S.C. section 360bbb-3(b)(1), unless the authorization is terminated or revoked sooner.  Performed at [facility], [address], [place], [place] [address]   MRSA Next Gen by PCR, Nasal     Status: None   Collection Time: 12/19/21  7:05 AM   Specimen: Nasal Mucosa; Nasal Swab  Result Value Ref Range  Status   MRSA by PCR Next Gen NOT DETECTED NOT DETECTED Final    Comment: (NOTE) The GeneXpert MRSA Assay (FDA approved for NASAL specimens only), is one component of a comprehensive MRSA colonization surveillance program. It is not intended to diagnose MRSA infection nor to guide or monitor treatment for MRSA infections. Test performance is not FDA approved in patients less than 66 years old. Performed at [facility], [address], [place], [place] [address]   Culture, blood (Routine X 2) w Reflex to ID Panel     Status: None   Collection Time: 12/19/21  7:11 AM   Specimen: BLOOD RIGHT HAND  Result Value Ref Range Status   Specimen Description BLOOD RIGHT HAND  Final   Special Requests   Final    BOTTLES DRAWN AEROBIC AND ANAEROBIC Blood Culture adequate volume   Culture   Final    NO GROWTH 5 DAYS Performed at [facility], [address], [place], [place] [address]    Report Status 12/24/2021 FINAL  Final  Culture, blood (Routine X 2) w Reflex to ID Panel     Status: None   Collection Time: 12/19/21  7:12 AM   Specimen: Right Antecubital; Blood   Result Value Ref Range Status   Specimen Description RIGHT ANTECUBITAL  Final   Special Requests   Final    BOTTLES DRAWN AEROBIC AND ANAEROBIC Blood Culture adequate volume   Culture   Final    NO GROWTH 5 DAYS Performed at [facility], [address] [place], [place] [address]    Report Status 12/24/2021 FINAL  Final  Urine Culture     Status: None   Collection Time: 12/24/21  4:44 AM   Specimen: Urine, Clean Catch  Result Value Ref Range Status   Specimen Description   Final    URINE, CLEAN CATCH Performed at [facility], [address], [place], [place] [address]    Special Requests   Final    NONE Performed at [facility], [address] [place], [place] [address]    Culture   Final    NO GROWTH Performed at [facility], [place] [address], [place], [place] [address]    Report Status 12/25/2021 FINAL  Final    Labs: CBC: Recent Labs  Lab 12/21/21 0526 12/22/21 0550 12/23/21 0604 12/24/21 0537 12/25/21 0535  WBC 20.9* 16.9* 16.8* 13.5* 12.5*  HGB 8.4* 7.7* 8.2* 8.0* 8.3*  HCT 28.6* 25.8* 28.1* 27.7* 29.2*  MCV 67.9* 67.9* 70.4* 70.3* 71.2*  PLT 799* 755* 662* 638* 751*   Basic Metabolic Panel: Recent Labs  Lab 12/21/21 0526 12/22/21 0550 12/23/21 0604 12/24/21 0537 12/25/21 0535 12/26/21 0447  NA 137 138 139 139 143 141  K 3.3* 3.3* 3.5 3.5 4.0 3.7  CL 108 111 114* 115* 115* 114*  CO2 20* 21* 20* 19* 22 21*  GLUCOSE 185* 156* 153* 112* 124* 133*  BUN 57* 41* 28* _0 CREATININE 1.52* 1.19 1.08 0.95 0.87 0.90  CALCIUM 8.6* 8.2* 8.6* 8.4* 8.6* 8.7*  MG 2.4 2.2 2.2 2.1 2.2  --    Liver Function Tests: Recent Labs  Lab 12/21/21 0543  ALBUMIN 2.3*   CBG: Recent Labs  Lab 12/26/21 0714 12/26/21 1200 12/26/21 1614 12/26/21 2104 12/27/21 0717  GLUCAP 131* 206* 168* 158* 140*    Discharge time spent: greater than 30 minutes.  Signed: Barton Dubois, MD [facility] 12/27/2021

## 2021-12-27 NOTE — Progress Notes (Signed)
Nsg Discharge Note  Admit Date:  12/16/2021 Discharge date: 12/27/2021   Shirlean Mylar to be D/C'd Home per MD order.  AVS completed.   Patient/caregiver able to verbalize understanding.  Discharge Medication: Allergies as of 12/27/2021       Reactions   Lipitor [atorvastatin] Other (See Comments)   Muscle aches   Simvastatin Other (See Comments)   Muscle aches   Zithromax [azithromycin] Other (See Comments)   GI-SIDE EFFECTS   Amlodipine Other (See Comments)        Medication List     STOP taking these medications    CINNAMON PO   ibuprofen 200 MG tablet Commonly known as: ADVIL   oxymetazoline 0.05 % nasal spray Commonly known as: AFRIN   ramipril 10 MG capsule Commonly known as: ALTACE       TAKE these medications    acetaminophen 500 MG tablet Commonly known as: TYLENOL Take 2 tablets (1,000 mg total) by mouth every 8 (eight) hours as needed for mild pain or headache (or Fever >/= 101).   allopurinol 100 MG tablet Commonly known as: ZYLOPRIM Take 100 mg by mouth daily.   apixaban 5 MG Tabs tablet Commonly known as: Eliquis TAKE 1 TABLET(5 MG) BY MOUTH TWICE DAILY   bisacodyl 10 MG suppository Commonly known as: DULCOLAX Place 1 suppository (10 mg total) rectally daily as needed for moderate constipation.   budesonide-formoterol 80-4.5 MCG/ACT inhaler Commonly known as: Symbicort Inhale 2 puffs into the lungs in the morning and at bedtime.   Combivent Respimat 20-100 MCG/ACT Aers respimat Generic drug: Ipratropium-Albuterol Inhale 1 puff into the lungs every 6 (six) hours as needed for wheezing or shortness of breath.   dextromethorphan-guaiFENesin 30-600 MG 12hr tablet Commonly known as: MUCINEX DM Take 1 tablet by mouth 2 (two) times daily for 10 days.   diclofenac Sodium 1 % Gel Commonly known as: VOLTAREN Apply 4 g topically 4 (four) times daily.   diltiazem 240 MG 24 hr capsule Commonly known as: CARDIZEM CD TAKE 1 CAPSULE BY MOUTH   DAILY   fish oil-omega-3 fatty acids 1000 MG capsule Take 2 g by mouth 2 (two) times daily.   furosemide 40 MG tablet Commonly known as: LASIX Take 1 tablet (40 mg total) by mouth daily as needed for fluid or edema. What changed:  when to take this reasons to take this   loratadine 10 MG tablet Commonly known as: CLARITIN Take 10 mg by mouth daily as needed for allergies.   losartan 25 MG tablet Commonly known as: Cozaar Take 0.5 tablets (12.5 mg total) by mouth daily.   metFORMIN 1000 MG tablet Commonly known as: GLUCOPHAGE Take 1,000 mg by mouth 2 (two) times daily.   metoprolol succinate 50 MG 24 hr tablet Commonly known as: TOPROL-XL Take 50 mg by mouth daily.   Mounjaro 5 MG/0.5ML Pen Generic drug: tirzepatide Inject 5 mg into the skin once a week.   multivitamin with minerals tablet Take 1 tablet by mouth daily.   omeprazole 20 MG capsule Commonly known as: PRILOSEC Take 2 capsules (40 mg total) by mouth 2 (two) times daily before a meal. What changed:  how much to take when to take this   polyethylene glycol 17 g packet Commonly known as: MIRALAX / GLYCOLAX Take 17 g by mouth 3 (three) times daily. Titrate down to PRN base on further resolution of his ileus.   potassium chloride SA 20 MEQ tablet Commonly known as: KLOR-CON M TAKE 1 TABLET  BY MOUTH  DAILY AS NEEDED WHILE  TAKING FUROSEMIDE What changed: See the new instructions.   pregabalin 100 MG capsule Commonly known as: LYRICA Take 100 mg by mouth 2 (two) times a day.   traMADol 50 MG tablet Commonly known as: ULTRAM Take 1 tablet (50 mg total) by mouth every 8 (eight) hours as needed for severe pain.   Zetia 10 MG tablet Generic drug: ezetimibe Take 10 mg by mouth daily.               Durable Medical Equipment  (From admission, onward)           Start     Ordered   12/23/21 1227  For home use only DME Tub bench  Once       Comments: Transfer bench   12/23/21 1226   12/23/21  1111  For home use only DME 3 n 1  Once       Comments: Patient will need heavy duty 3N1 due to body habitus.   12/23/21 1112            Discharge Assessment: Vitals:   12/27/21 0436 12/27/21 0826  BP:    Pulse: 80   Resp: 20   Temp: 97.6 F (36.4 C)   SpO2: 94% 93%   Skin clean, dry and intact without evidence of skin break down, no evidence of skin tears noted. IV catheter discontinued intact. Site without signs and symptoms of complications - no redness or edema noted at insertion site, patient denies c/o pain - only slight tenderness at site.  Dressing with slight pressure applied.  D/c Instructions-Education: Discharge instructions given to patient/family with verbalized understanding. D/c education completed with patient/family including follow up instructions, medication list, d/c activities limitations if indicated, with other d/c instructions as indicated by MD - patient able to verbalize understanding, all questions fully answered. Patient instructed to return to ED, call 911, or call MD for any changes in condition.  Patient escorted via [place], and D/C home via private auto.  Kathie Rhodes, RN 12/27/2021 9:33 AM

## 2021-12-27 NOTE — TOC Transition Note (Signed)
Transition of Care [facility]) - CM/SW Discharge Note   Patient Details  Name: Steve Jensen MRN: [id number] Date of Birth: Mar 20, 1941  Transition of Care [facility]) CM/SW Contact:  Salome Arnt, LCSW Phone Number: 12/27/2021, 9:00 AM   Clinical Narrative:  Pt d/c today to [facility]. Facility confirmed authorization received. D/C summary sent to SNF. [name] requests [facility] transport. LCSW will provide number to RN to call report when available. [name] aware to take cpap with settings and mask to SNF.      Final next level of care: Skilled Nursing Facility Barriers to Discharge: Barriers Resolved   Patient Goals and CMS Choice Patient states their goals for this hospitalization and ongoing recovery are:: agreeable to SNF CMS Medicare.gov Compare Post Acute Care list provided to:: Patient Represenative (must comment) Choice offered to / list presented to : Adult Children  Discharge Placement              Patient chooses bed at: [facility] Patient to be transferred to facility by: [facility] Name of family member notified: Hal Hope- granddaughter Patient and family notified of of transfer: 12/27/21  Discharge Plan and Services In-house Referral: Clinical Social Work Discharge Planning Services: CM Consult Post Acute Care Choice: Durable Medical Equipment, [place]                               Social Determinants of Health (SDOH) Interventions     Readmission Risk Interventions    12/26/2021   12:51 PM 12/25/2021    3:31 PM 12/19/2021   12:39 PM  Readmission Risk Prevention Plan  Transportation Screening  Complete Complete  PCP or Specialist Appt within 5-7 Days Complete    Home Care Screening  Complete   Medication Review (RN CM)  Complete   HRI or Home Care Consult   Complete  Social Work Consult for [place] Planning/Counseling   Complete  Palliative Care Screening   Not Applicable  Medication Review  Press photographer)   Complete

## 2021-12-30 DIAGNOSIS — I10 Essential (primary) hypertension: Secondary | ICD-10-CM | POA: Diagnosis not present

## 2021-12-30 DIAGNOSIS — I4891 Unspecified atrial fibrillation: Secondary | ICD-10-CM | POA: Diagnosis not present

## 2021-12-31 ENCOUNTER — Other Ambulatory Visit: Payer: Self-pay | Admitting: *Deleted

## 2021-12-31 DIAGNOSIS — I10 Essential (primary) hypertension: Secondary | ICD-10-CM | POA: Diagnosis not present

## 2021-12-31 DIAGNOSIS — E782 Mixed hyperlipidemia: Secondary | ICD-10-CM | POA: Diagnosis not present

## 2021-12-31 DIAGNOSIS — I4891 Unspecified atrial fibrillation: Secondary | ICD-10-CM | POA: Diagnosis not present

## 2021-12-31 NOTE — Patient Outreach (Signed)
Mr. Southgate resides in [facility]. Screening for care coordination/care management services as a benefit of insurance plan and PCP.  Facility site visit with [facility]. Met with [place], SNF SW who indicates Mr. Caraway is from home with grandson and grandson's wife. He needed very little assist with bathing and dressing prior. He was ambulating with cane prior.  Currently requiring hoyer lift. Goal is to return home with family. Needs to be modified independent prior to returning home. May need LTC if he does not progress with therapy.   Mr. Budge's PCP office has [facility] care management.  Will continue to follow while remains in SNF.    Marthenia Rolling, MSN, RN,BSN [place] Acute Care Coordinator [phone] [facility]) [phone]  (Toll free office)

## 2022-01-01 DIAGNOSIS — G4733 Obstructive sleep apnea (adult) (pediatric): Secondary | ICD-10-CM | POA: Diagnosis not present

## 2022-01-01 DIAGNOSIS — I5032 Chronic diastolic (congestive) heart failure: Secondary | ICD-10-CM | POA: Diagnosis not present

## 2022-01-01 DIAGNOSIS — E782 Mixed hyperlipidemia: Secondary | ICD-10-CM | POA: Diagnosis not present

## 2022-01-01 DIAGNOSIS — E1165 Type 2 diabetes mellitus with hyperglycemia: Secondary | ICD-10-CM | POA: Diagnosis not present

## 2022-01-01 DIAGNOSIS — J9601 Acute respiratory failure with hypoxia: Secondary | ICD-10-CM | POA: Diagnosis not present

## 2022-01-01 DIAGNOSIS — I4891 Unspecified atrial fibrillation: Secondary | ICD-10-CM | POA: Diagnosis not present

## 2022-01-03 DIAGNOSIS — J9601 Acute respiratory failure with hypoxia: Secondary | ICD-10-CM | POA: Diagnosis not present

## 2022-01-03 DIAGNOSIS — I5032 Chronic diastolic (congestive) heart failure: Secondary | ICD-10-CM | POA: Diagnosis not present

## 2022-01-03 DIAGNOSIS — I4891 Unspecified atrial fibrillation: Secondary | ICD-10-CM | POA: Diagnosis not present

## 2022-01-03 DIAGNOSIS — G4733 Obstructive sleep apnea (adult) (pediatric): Secondary | ICD-10-CM | POA: Diagnosis not present

## 2022-01-03 DIAGNOSIS — E1165 Type 2 diabetes mellitus with hyperglycemia: Secondary | ICD-10-CM | POA: Diagnosis not present

## 2022-01-03 DIAGNOSIS — E782 Mixed hyperlipidemia: Secondary | ICD-10-CM | POA: Diagnosis not present

## 2022-01-03 NOTE — Progress Notes (Deleted)
Cardiology Office Note    Date:  01/03/2022   ID:  Steve Jensen, DOB [birth year], MRN [id number]   PCP:  [name], MD   [place]  Cardiologist:  Larae Grooms, MD   Advanced Practice Provider:  No care team member to display Electrophysiologist:  None   [phone]   No chief complaint on file.   History of Present Illness:  Steve Jensen is a 81 y.o. male with history of PAF on eliquis, HTN, chronic diastolic CHF, OSA, DM, obesity, inoperable liver cancer treated with embolization. No Afib on monitor 03/2021  Last saw Dr. Irish Lack 07/2021 doing well.  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 cardioversion done   GERD (gastroesophageal reflux disease)    Hyperlipidemia    Hypertension    Morbid obesity ([place])    Neuromuscular disorder ([place])    feet   OSA (obstructive sleep apnea)    On BiPAP at 15/11cm H2O   Prostate cancer ([place])    Shingles    on face Nov 2010   Urinary incontinence     Past Surgical History:  Procedure Laterality Date   CARDIOVERSION N/A 09/06/2013   Procedure: CARDIOVERSION;  Surgeon: [name], MD;  Location: [facility] ENDOSCOPY;  Service: Cardiovascular;  Laterality: N/A;   EYE SURGERY     cataract surgery bilat; surgery to correct droopy eyelids    FLEXIBLE SIGMOIDOSCOPY N/A 12/24/2021   Procedure: FLEXIBLE SIGMOIDOSCOPY;  Surgeon: Harvel Quale, MD;  Location: [facility] ENDO SUITE;  Service: Gastroenterology;  Laterality: N/A;   IR ANGIOGRAM SELECTIVE EACH ADDITIONAL VESSEL  02/15/2018   IR ANGIOGRAM SELECTIVE EACH ADDITIONAL VESSEL  02/15/2018   IR ANGIOGRAM SELECTIVE EACH ADDITIONAL VESSEL  02/15/2018   IR ANGIOGRAM SELECTIVE EACH ADDITIONAL VESSEL  02/15/2018   IR ANGIOGRAM SELECTIVE EACH ADDITIONAL VESSEL  02/15/2018   IR ANGIOGRAM  VISCERAL SELECTIVE  02/15/2018   IR ANGIOGRAM VISCERAL SELECTIVE  02/15/2018   IR EMBO TUMOR ORGAN ISCHEMIA INFARCT INC GUIDE ROADMAPPING  02/15/2018   IR RADIOLOGIST EVAL & MGMT  01/26/2018   IR RADIOLOGIST EVAL & MGMT  03/17/2018   IR RADIOLOGIST EVAL & MGMT  06/29/2018   IR RADIOLOGIST EVAL & MGMT  07/27/2018   IR RADIOLOGIST EVAL & MGMT  10/28/2018   IR RADIOLOGIST EVAL & MGMT  12/14/2018   IR RADIOLOGIST EVAL & MGMT  03/17/2019   IR RADIOLOGIST EVAL & MGMT  07/14/2019   IR RADIOLOGIST EVAL & MGMT  12/27/2019   IR RADIOLOGIST EVAL & MGMT  08/21/2020   IR RADIOLOGIST EVAL & MGMT  02/28/2021   IR RADIOLOGIST EVAL & MGMT  06/25/2021   IR RADIOLOGIST EVAL & MGMT  10/18/2021   IR US GUIDE VASC ACCESS RIGHT  02/15/2018   knee surgery bilat      PROSTATE SURGERY     RADIOLOGY WITH ANESTHESIA N/A 11/24/2018   Procedure: MICROWAVE THERMAL ABLATION LIVER;  Surgeon: Sandi Mariscal, MD;  Location: [facility] ORS;  Service: Anesthesiology;  Laterality: N/A;   right shoulder rotator cuff surgery     TONSILLECTOMY      Current Medications: No outpatient medications have been marked as taking for the 01/07/22 encounter (Appointment) with Imogene Burn, PA-C.     Allergies:   Lipitor [atorvastatin], Simvastatin,  Zithromax [azithromycin], and Amlodipine   Social History   Socioeconomic History   Marital status: Widowed    Spouse name: Not on file   Number of children: Not on file   Years of education: Not on file   Highest education level: Not on file  Occupational History   Not on file  Tobacco Use   Smoking status: Former    Packs/day: 1.00    Years: 15.00    Total pack years: 15.00    Types: Cigarettes    Quit date: 05/27/1975    Years since quitting: 46.6   Smokeless tobacco: Never  Vaping Use   Vaping Use: Never used  Substance and Sexual Activity   Alcohol use: No   Drug use: No   Sexual activity: Not on file  Other Topics Concern   Not on file  Social History Narrative   Not on file   Social  Determinants of Health   Financial Resource Strain: Not on file  Food Insecurity: Not on file  Transportation Needs: Not on file  Physical Activity: Not on file  Stress: Not on file  Social Connections: Not on file     Family History:  The patient's ***family history includes CVA in his brother; Cancer in his brother; Diabetes in his brother, brother, and brother; Heart attack in his brother; Heart disease in his brother; Hypertension in his brother, brother, and brother; Prostate cancer in his brother; Stroke in his brother.   ROS:   Please see the history of present illness.    ROS All other systems reviewed and are negative.   PHYSICAL EXAM:   VS:  There were no vitals taken for this visit.  Physical Exam  GEN: Well nourished, well developed, in no acute distress  HEENT: normal  Neck: no JVD, carotid bruits, or masses Cardiac:RRR; no murmurs, rubs, or gallops  Respiratory:  clear to auscultation bilaterally, normal work of breathing GI: soft, nontender, nondistended, + BS Ext: without cyanosis, clubbing, or edema, Good distal pulses bilaterally MS: no deformity or atrophy  Skin: warm and dry, no rash Neuro:  Alert and Oriented x 3, Strength and sensation are intact Psych: euthymic mood, full affect  Wt Readings from Last 3 Encounters:  12/17/21 290 lb 12.6 oz (131.9 kg)  08/21/21 (!) 302 lb (137 kg)  04/23/21 (!) 305 lb (138.3 kg)      Studies/Labs Reviewed:   EKG:  EKG is*** ordered today.  The ekg ordered today demonstrates ***  Recent Labs: 02/18/2021: NT-Pro BNP 99 10/09/2021: ALT 23 12/16/2021: B Natriuretic Peptide 146.0 12/25/2021: Hemoglobin 8.3; Magnesium 2.2; Platelets 652 12/26/2021: BUN 11; Creatinine, Ser 0.90; Potassium 3.7; Sodium 141   Lipid Panel    Component Value Date/Time   CHOL 158 12/18/2014 1039   TRIG 188.0 (H) 12/18/2014 1039   HDL 31.00 (L) 12/18/2014 1039   CHOLHDL 5 12/18/2014 1039   VLDL 37.6 12/18/2014 1039   LDLCALC 89 12/18/2014  1039    Additional studies/ records that were reviewed today include:  Monitor 03/2021 Normal Sinus rhtyhm with rare PACs and PVCs. Rare runs of PACs not associated with symptoms. No atrial fibrillation. No sustained arrhythmias.     Patch Wear Time:  13 days and 20 hours (2022-10-15T16:41:00-0400 to 2022-10-29T13:04:40-0400)   Patient had a min HR of 48 bpm, Conard HR of 171 bpm, and avg HR of 81 bpm. Predominant underlying rhythm was Sinus Rhythm. 46 Supraventricular Tachycardia runs occurred, the run with the fastest interval lasting 7  beats with a Vraj rate of 171 bpm, the  longest lasting 16.3 secs with an avg rate of 124 bpm. Isolated SVEs were occasional (1.6%, 26208), SVE Couplets were rare (<1.0%, 1064), and SVE Triplets were rare (<1.0%, 125). Isolated VEs were rare (<1.0%, 10787), VE Couplets were rare (<1.0%, 98),  and VE Triplets were rare (<1.0%, 4).   Echo 01/2021 IMPRESSIONS     1. Left ventricular ejection fraction, by estimation, is 55 to 60%. The  left ventricle has normal function. The left ventricle has no regional  wall motion abnormalities. Left ventricular diastolic parameters are  consistent with Grade I diastolic  dysfunction (impaired relaxation).   2. Right ventricular systolic function is normal. The right ventricular  size is normal.   3. The mitral valve is normal in structure. No evidence of mitral valve  regurgitation. No evidence of mitral stenosis.   4. The aortic valve is normal in structure. Aortic valve regurgitation is  mild. Mild aortic valve sclerosis is present, with no evidence of aortic  valve stenosis.   5. The inferior vena cava is normal in size with greater than 50%  respiratory variability, suggesting right atrial pressure of 3 mmHg.   Comparison(s): 08/18/13 EF 55-60%.      Risk Assessment/Calculations:   {Does this patient have ATRIAL FIBRILLATION?:[id number]}     ASSESSMENT:    No diagnosis found.   PLAN:  In order of  problems listed above:  PAF on Eliquis  Chronic diastolic CHF  HTN  OSA  DM  Obesity  Shared Decision Making/Informed Consent   {Are you ordering a CV Procedure (e.g. stress test, cath, DCCV, TEE, etc)?   Press F2        :[id number]}    Medication Adjustments/Labs and Tests Ordered: Current medicines are reviewed at length with the patient today.  Concerns regarding medicines are outlined above.  Medication changes, Labs and Tests ordered today are listed in the Patient Instructions below. There are no Patient Instructions on file for this visit.   Sumner Boast, PA-C  01/03/2022 8:18 AM    [facility] [place], [place], [place]  [address] Phone: [phone]; Fax: [phone]

## 2022-01-06 DIAGNOSIS — M170 Bilateral primary osteoarthritis of knee: Secondary | ICD-10-CM | POA: Diagnosis not present

## 2022-01-07 ENCOUNTER — Other Ambulatory Visit: Payer: Self-pay | Admitting: *Deleted

## 2022-01-07 ENCOUNTER — Ambulatory Visit: Payer: Medicare Other | Admitting: Physician Assistant

## 2022-01-07 DIAGNOSIS — I10 Essential (primary) hypertension: Secondary | ICD-10-CM

## 2022-01-07 DIAGNOSIS — G4733 Obstructive sleep apnea (adult) (pediatric): Secondary | ICD-10-CM

## 2022-01-07 DIAGNOSIS — E1165 Type 2 diabetes mellitus with hyperglycemia: Secondary | ICD-10-CM

## 2022-01-07 DIAGNOSIS — I480 Paroxysmal atrial fibrillation: Secondary | ICD-10-CM

## 2022-01-07 DIAGNOSIS — I5032 Chronic diastolic (congestive) heart failure: Secondary | ICD-10-CM

## 2022-01-07 NOTE — Patient Outreach (Signed)
[facility] Post- Acute Care Coordinator follow up. Steve Jensen resides in [place] [facility]. Screening for care coordination/care management needs as benefit of insurance plan and PCP.  PC[facility] office [facility] has [facility] care management.  Collaboration with [place], [place], Civil engineer, contracting at [facility]. Discussed transition plans and progression. Steve Jensen continues to require hoyer lift. Complains of knee pain. Requires knee injections. Has upcoming ortho and cardiology appointments scheduled.  Transition plan is to go home with family.  SW and therapy manager feel family will be able to assist Steve Jensen at home. Does not have Medicaid.   Will plan outreach to family to discuss transition plans.    Marthenia Rolling, MSN, RN,BSN [place] Acute Care Coordinator [phone] [facility]) [phone]  (Toll free office)

## 2022-01-08 DIAGNOSIS — E1165 Type 2 diabetes mellitus with hyperglycemia: Secondary | ICD-10-CM | POA: Diagnosis not present

## 2022-01-08 DIAGNOSIS — G4733 Obstructive sleep apnea (adult) (pediatric): Secondary | ICD-10-CM | POA: Diagnosis not present

## 2022-01-08 DIAGNOSIS — I4891 Unspecified atrial fibrillation: Secondary | ICD-10-CM | POA: Diagnosis not present

## 2022-01-08 DIAGNOSIS — I5032 Chronic diastolic (congestive) heart failure: Secondary | ICD-10-CM | POA: Diagnosis not present

## 2022-01-08 DIAGNOSIS — J9601 Acute respiratory failure with hypoxia: Secondary | ICD-10-CM | POA: Diagnosis not present

## 2022-01-08 DIAGNOSIS — E782 Mixed hyperlipidemia: Secondary | ICD-10-CM | POA: Diagnosis not present

## 2022-01-09 DIAGNOSIS — J9601 Acute respiratory failure with hypoxia: Secondary | ICD-10-CM | POA: Diagnosis not present

## 2022-01-09 DIAGNOSIS — Z719 Counseling, unspecified: Secondary | ICD-10-CM | POA: Diagnosis not present

## 2022-01-10 DIAGNOSIS — E782 Mixed hyperlipidemia: Secondary | ICD-10-CM | POA: Diagnosis not present

## 2022-01-10 DIAGNOSIS — J9601 Acute respiratory failure with hypoxia: Secondary | ICD-10-CM | POA: Diagnosis not present

## 2022-01-10 DIAGNOSIS — E1165 Type 2 diabetes mellitus with hyperglycemia: Secondary | ICD-10-CM | POA: Diagnosis not present

## 2022-01-10 DIAGNOSIS — I5032 Chronic diastolic (congestive) heart failure: Secondary | ICD-10-CM | POA: Diagnosis not present

## 2022-01-10 DIAGNOSIS — G4733 Obstructive sleep apnea (adult) (pediatric): Secondary | ICD-10-CM | POA: Diagnosis not present

## 2022-01-10 DIAGNOSIS — I4891 Unspecified atrial fibrillation: Secondary | ICD-10-CM | POA: Diagnosis not present

## 2022-01-14 ENCOUNTER — Other Ambulatory Visit: Payer: Self-pay | Admitting: *Deleted

## 2022-01-14 NOTE — Patient Outreach (Addendum)
[facility] Post- Acute Care Coordinator follow up.   Mr. Knupp resides in [facility]. Met with Marcene Corning therapy manager who reports Mr. Deloria last covered day issued by insurance is 01/16/22. Transition plan is home with granddaughter.  Reports Mr. Minehart requires total care and hoyer lift. He also currently has COVID.   Will plan outreach to granddaughter to discuss care coordination needs.   PCP office with Sadie Haber at [facility] has [facility] care management services.   Marthenia Rolling, MSN, RN,BSN [place] Acute Care Coordinator [phone] [facility]) [phone]  (Toll free office)

## 2022-01-15 DIAGNOSIS — I5032 Chronic diastolic (congestive) heart failure: Secondary | ICD-10-CM | POA: Diagnosis not present

## 2022-01-15 DIAGNOSIS — U071 COVID-19: Secondary | ICD-10-CM | POA: Diagnosis not present

## 2022-01-15 DIAGNOSIS — J9601 Acute respiratory failure with hypoxia: Secondary | ICD-10-CM | POA: Diagnosis not present

## 2022-01-15 DIAGNOSIS — E1165 Type 2 diabetes mellitus with hyperglycemia: Secondary | ICD-10-CM | POA: Diagnosis not present

## 2022-01-15 DIAGNOSIS — E782 Mixed hyperlipidemia: Secondary | ICD-10-CM | POA: Diagnosis not present

## 2022-01-15 DIAGNOSIS — G4733 Obstructive sleep apnea (adult) (pediatric): Secondary | ICD-10-CM | POA: Diagnosis not present

## 2022-01-15 DIAGNOSIS — I4891 Unspecified atrial fibrillation: Secondary | ICD-10-CM | POA: Diagnosis not present

## 2022-01-15 DIAGNOSIS — Z8616 Personal history of COVID-19: Secondary | ICD-10-CM | POA: Diagnosis not present

## 2022-01-17 DIAGNOSIS — R262 Difficulty in walking, not elsewhere classified: Secondary | ICD-10-CM | POA: Diagnosis not present

## 2022-01-17 DIAGNOSIS — M6281 Muscle weakness (generalized): Secondary | ICD-10-CM | POA: Diagnosis not present

## 2022-01-17 DIAGNOSIS — R1312 Dysphagia, oropharyngeal phase: Secondary | ICD-10-CM | POA: Diagnosis not present

## 2022-01-17 DIAGNOSIS — R2689 Other abnormalities of gait and mobility: Secondary | ICD-10-CM | POA: Diagnosis not present

## 2022-01-19 DIAGNOSIS — R1312 Dysphagia, oropharyngeal phase: Secondary | ICD-10-CM | POA: Diagnosis not present

## 2022-01-19 DIAGNOSIS — R262 Difficulty in walking, not elsewhere classified: Secondary | ICD-10-CM | POA: Diagnosis not present

## 2022-01-19 DIAGNOSIS — R2689 Other abnormalities of gait and mobility: Secondary | ICD-10-CM | POA: Diagnosis not present

## 2022-01-19 DIAGNOSIS — M6281 Muscle weakness (generalized): Secondary | ICD-10-CM | POA: Diagnosis not present

## 2022-01-20 DIAGNOSIS — R1312 Dysphagia, oropharyngeal phase: Secondary | ICD-10-CM | POA: Diagnosis not present

## 2022-01-20 DIAGNOSIS — R262 Difficulty in walking, not elsewhere classified: Secondary | ICD-10-CM | POA: Diagnosis not present

## 2022-01-20 DIAGNOSIS — M6281 Muscle weakness (generalized): Secondary | ICD-10-CM | POA: Diagnosis not present

## 2022-01-20 DIAGNOSIS — R2689 Other abnormalities of gait and mobility: Secondary | ICD-10-CM | POA: Diagnosis not present

## 2022-01-21 DIAGNOSIS — R2689 Other abnormalities of gait and mobility: Secondary | ICD-10-CM | POA: Diagnosis not present

## 2022-01-21 DIAGNOSIS — M6281 Muscle weakness (generalized): Secondary | ICD-10-CM | POA: Diagnosis not present

## 2022-01-21 DIAGNOSIS — R262 Difficulty in walking, not elsewhere classified: Secondary | ICD-10-CM | POA: Diagnosis not present

## 2022-01-21 DIAGNOSIS — R1312 Dysphagia, oropharyngeal phase: Secondary | ICD-10-CM | POA: Diagnosis not present

## 2022-01-22 DIAGNOSIS — R262 Difficulty in walking, not elsewhere classified: Secondary | ICD-10-CM | POA: Diagnosis not present

## 2022-01-22 DIAGNOSIS — R2689 Other abnormalities of gait and mobility: Secondary | ICD-10-CM | POA: Diagnosis not present

## 2022-01-22 DIAGNOSIS — R1312 Dysphagia, oropharyngeal phase: Secondary | ICD-10-CM | POA: Diagnosis not present

## 2022-01-22 DIAGNOSIS — M6281 Muscle weakness (generalized): Secondary | ICD-10-CM | POA: Diagnosis not present

## 2022-01-22 DIAGNOSIS — M6259 Muscle wasting and atrophy, not elsewhere classified, multiple sites: Secondary | ICD-10-CM | POA: Diagnosis not present

## 2022-01-23 ENCOUNTER — Other Ambulatory Visit: Payer: Self-pay | Admitting: *Deleted

## 2022-01-23 DIAGNOSIS — M6281 Muscle weakness (generalized): Secondary | ICD-10-CM | POA: Diagnosis not present

## 2022-01-23 DIAGNOSIS — U071 COVID-19: Secondary | ICD-10-CM | POA: Diagnosis not present

## 2022-01-23 DIAGNOSIS — M6259 Muscle wasting and atrophy, not elsewhere classified, multiple sites: Secondary | ICD-10-CM | POA: Diagnosis not present

## 2022-01-23 DIAGNOSIS — R262 Difficulty in walking, not elsewhere classified: Secondary | ICD-10-CM | POA: Diagnosis not present

## 2022-01-23 NOTE — Patient Outreach (Signed)
[place] Coordinator follow up.   [facility] Met with SNF social workers and Civil engineer, contracting at [employer].   Mr. Sally to discharge home today with family. Will have [facility].   PCP office [facility] has [facility] care management. Will send secure notification to [facility] Care management of SNF discharge.   Marthenia Rolling, MSN, RN,BSN [place] Acute Care Coordinator [phone] [facility]) [phone]  (Toll free office)

## 2022-01-24 DIAGNOSIS — M6281 Muscle weakness (generalized): Secondary | ICD-10-CM | POA: Diagnosis not present

## 2022-01-24 DIAGNOSIS — M6259 Muscle wasting and atrophy, not elsewhere classified, multiple sites: Secondary | ICD-10-CM | POA: Diagnosis not present

## 2022-01-24 DIAGNOSIS — R262 Difficulty in walking, not elsewhere classified: Secondary | ICD-10-CM | POA: Diagnosis not present

## 2022-01-27 NOTE — Progress Notes (Deleted)
Office Visit    Patient Name: Steve Jensen Date of Encounter: 01/27/2022  Primary Care Provider:  [name], MD Primary Cardiologist:  Steve Grooms, MD Primary Electrophysiologist: None  Chief Complaint    Steve Jensen is a 81 y.o. male with PMH of chronic A-fib (on Eliquis), HTN, HFpEF, DM type II, GERD, HLD, morbid obesity, OSA (on BiPAP), prostate CA who presents today for hospital follow-up.  Past Medical History    Past Medical History:  Diagnosis Date   Anemia    Diabetes mellitus type II, controlled ([place])    with neuropathy   Dysrhythmia    A-Fib. cardioversion done   GERD (gastroesophageal reflux disease)    Hyperlipidemia    Hypertension    Morbid obesity ([place])    Neuromuscular disorder ([place])    feet   OSA (obstructive sleep apnea)    On BiPAP at 15/11cm H2O   Prostate cancer ([place])    Shingles    on face Nov 2010   Urinary incontinence    Past Surgical History:  Procedure Laterality Date   CARDIOVERSION N/A 09/06/2013   Procedure: CARDIOVERSION;  Surgeon: [name], MD;  Location: [facility] ENDOSCOPY;  Service: Cardiovascular;  Laterality: N/A;   EYE SURGERY     cataract surgery bilat; surgery to correct droopy eyelids    FLEXIBLE SIGMOIDOSCOPY N/A 12/24/2021   Procedure: FLEXIBLE SIGMOIDOSCOPY;  Surgeon: Harvel Quale, MD;  Location: [facility] ENDO SUITE;  Service: Gastroenterology;  Laterality: N/A;   IR ANGIOGRAM SELECTIVE EACH ADDITIONAL VESSEL  02/15/2018   IR ANGIOGRAM SELECTIVE EACH ADDITIONAL VESSEL  02/15/2018   IR ANGIOGRAM SELECTIVE EACH ADDITIONAL VESSEL  02/15/2018   IR ANGIOGRAM SELECTIVE EACH ADDITIONAL VESSEL  02/15/2018   IR ANGIOGRAM SELECTIVE EACH ADDITIONAL VESSEL  02/15/2018   IR ANGIOGRAM VISCERAL SELECTIVE  02/15/2018   IR ANGIOGRAM VISCERAL SELECTIVE  02/15/2018   IR EMBO TUMOR ORGAN ISCHEMIA INFARCT INC GUIDE ROADMAPPING  02/15/2018   IR RADIOLOGIST EVAL & MGMT  01/26/2018   IR RADIOLOGIST EVAL & MGMT  03/17/2018    IR RADIOLOGIST EVAL & MGMT  06/29/2018   IR RADIOLOGIST EVAL & MGMT  07/27/2018   IR RADIOLOGIST EVAL & MGMT  10/28/2018   IR RADIOLOGIST EVAL & MGMT  12/14/2018   IR RADIOLOGIST EVAL & MGMT  03/17/2019   IR RADIOLOGIST EVAL & MGMT  07/14/2019   IR RADIOLOGIST EVAL & MGMT  12/27/2019   IR RADIOLOGIST EVAL & MGMT  08/21/2020   IR RADIOLOGIST EVAL & MGMT  02/28/2021   IR RADIOLOGIST EVAL & MGMT  06/25/2021   IR RADIOLOGIST EVAL & MGMT  10/18/2021   IR US GUIDE VASC ACCESS RIGHT  02/15/2018   knee surgery bilat      PROSTATE SURGERY     RADIOLOGY WITH ANESTHESIA N/A 11/24/2018   Procedure: MICROWAVE THERMAL ABLATION LIVER;  Surgeon: Sandi Mariscal, MD;  Location: [facility] ORS;  Service: Anesthesiology;  Laterality: N/A;   right shoulder rotator cuff surgery     TONSILLECTOMY      Allergies  Allergies  Allergen Reactions   Lipitor [Atorvastatin] Other (See Comments)    Muscle aches   Simvastatin Other (See Comments)    Muscle aches   Zithromax [Azithromycin] Other (See Comments)    GI-SIDE EFFECTS   Amlodipine Other (See Comments)    History of Present Illness    Steve Jensen is a 81 year old male with the above-mentioned past medical history who presents today for hospital follow-up.  Steve Jensen has been followed by Dr. Irish Jensen since 2014 for management of CAD.  He was diagnosed with A-fib in 2015 and underwent DCCV to sinus rhythm on Eliquis for stroke prophylaxis.  He is currently on rate control with metoprolol and Cardizem.  He was recently admitted 11/2021 with sepsis secondary community-acquired PNA.  He was also noted to have hypoxic respiratory failure.  He was discharged with resolution of hypoxia and PNA.  He will have repeat chest x-ray completed in 6 to 8 weeks to rule out resolution of infiltrates.  Blood pressures were overall stable and patient was euvolemic upon discharge.  Since last being seen in the office patient reports***.  Patient denies chest pain, palpitations, dyspnea, PND,  orthopnea, nausea, vomiting, dizziness, syncope, edema, weight gain, or early satiety.     ***Notes:  Home Medications    Current Outpatient Medications  Medication Sig Dispense Refill   acetaminophen (TYLENOL) 500 MG tablet Take 2 tablets (1,000 mg total) by mouth every 8 (eight) hours as needed for mild pain or headache (or Fever >/= 101).     allopurinol (ZYLOPRIM) 100 MG tablet Take 100 mg by mouth daily.     apixaban (ELIQUIS) 5 MG TABS tablet TAKE 1 TABLET(5 MG) BY MOUTH TWICE DAILY 180 tablet 1   bisacodyl (DULCOLAX) 10 MG suppository Place 1 suppository (10 mg total) rectally daily as needed for moderate constipation.     budesonide-formoterol (SYMBICORT) 80-4.5 MCG/ACT inhaler Inhale 2 puffs into the lungs in the morning and at bedtime.     diclofenac Sodium (VOLTAREN) 1 % GEL Apply 4 g topically 4 (four) times daily.     diltiazem (CARDIZEM CD) 240 MG 24 hr capsule TAKE 1 CAPSULE BY MOUTH  DAILY 90 capsule 3   fish oil-omega-3 fatty acids 1000 MG capsule Take 2 g by mouth 2 (two) times daily.      furosemide (LASIX) 40 MG tablet Take 1 tablet (40 mg total) by mouth daily as needed for fluid or edema.     Ipratropium-Albuterol (COMBIVENT RESPIMAT) 20-100 MCG/ACT AERS respimat Inhale 1 puff into the lungs every 6 (six) hours as needed for wheezing or shortness of breath.     loratadine (CLARITIN) 10 MG tablet Take 10 mg by mouth daily as needed for allergies.      losartan (COZAAR) 25 MG tablet Take 0.5 tablets (12.5 mg total) by mouth daily.     metFORMIN (GLUCOPHAGE) 1000 MG tablet Take 1,000 mg by mouth 2 (two) times daily.  0   metoprolol succinate (TOPROL-XL) 50 MG 24 hr tablet Take 50 mg by mouth daily.      Multiple Vitamins-Minerals (MULTIVITAMIN WITH MINERALS) tablet Take 1 tablet by mouth daily.     omeprazole (PRILOSEC) 20 MG capsule Take 2 capsules (40 mg total) by mouth 2 (two) times daily before a meal.     polyethylene glycol (MIRALAX / GLYCOLAX) 17 g packet Take 17  g by mouth 3 (three) times daily. Titrate down to PRN base on further resolution of his ileus.     potassium chloride SA (KLOR-CON M) 20 MEQ tablet TAKE 1 TABLET BY MOUTH  DAILY AS NEEDED WHILE  TAKING FUROSEMIDE (Patient taking differently: Take 20 mEq by mouth daily.) 90 tablet 3   pregabalin (LYRICA) 100 MG capsule Take 100 mg by mouth 2 (two) times a day.      tirzepatide [facility]) 5 MG/0.5ML Pen Inject 5 mg into the skin once a week.     traMADol (  ULTRAM) 50 MG tablet Take 1 tablet (50 mg total) by mouth every 8 (eight) hours as needed for severe pain. 20 tablet 0   ZETIA 10 MG tablet Take 10 mg by mouth daily.      No current facility-administered medications for this visit.     Review of Systems  Please see the history of present illness.    (+)*** (+)***  All other systems reviewed and are otherwise negative except as noted above.  Physical Exam    Wt Readings from Last 3 Encounters:  12/17/21 290 lb 12.6 oz (131.9 kg)  08/21/21 (!) 302 lb (137 kg)  04/23/21 (!) 305 lb (138.3 kg)   EU:MPNTI were no vitals filed for this visit.,There is no height or weight on file to calculate BMI.  Constitutional:      Appearance: Healthy appearance. Not in distress.  Neck:     Vascular: JVD normal.  Pulmonary:     Effort: Pulmonary effort is normal.     Breath sounds: No wheezing. No rales. Diminished in the bases Cardiovascular:     Normal rate. Regular rhythm. Normal S1. Normal S2.      Murmurs: There is no murmur.  Edema:    Peripheral edema absent.  Abdominal:     Palpations: Abdomen is soft non tender. There is no hepatomegaly.  Skin:    General: Skin is warm and dry.  Neurological:     General: No focal deficit present.     Mental Status: Alert and oriented to person, place and time.     Cranial Nerves: Cranial nerves are intact.  EKG/LABS/Other Studies Reviewed    ECG personally reviewed by me today - ***  Risk Assessment/Calculations:   {Does this patient have  ATRIAL FIBRILLATION?:[phone]}        Lab Results  Component Value Date   WBC 12.5 (H) 12/25/2021   HGB 8.3 (L) 12/25/2021   HCT 29.2 (L) 12/25/2021   MCV 71.2 (L) 12/25/2021   PLT 652 (H) 12/25/2021   Lab Results  Component Value Date   CREATININE 0.90 12/26/2021   BUN 11 12/26/2021   NA 141 12/26/2021   K 3.7 12/26/2021   CL 114 (H) 12/26/2021   CO2 21 (L) 12/26/2021   Lab Results  Component Value Date   ALT 23 10/09/2021   AST 16 10/09/2021   ALKPHOS 133 (H) 07/27/2020   BILITOT 0.4 10/09/2021   Lab Results  Component Value Date   CHOL 158 12/18/2014   HDL 31.00 (L) 12/18/2014   LDLCALC 89 12/18/2014   TRIG 188.0 (H) 12/18/2014   CHOLHDL 5 12/18/2014    Lab Results  Component Value Date   HGBA1C 7.6 (H) 12/17/2021    Assessment & Plan    1.  HFpEF  2.  Paroxysmal AF  3.  Hypertension  4.  Obstructive sleep apnea      Disposition: Follow-up with Steve Grooms, MD or APP in *** months {Are you ordering a CV Procedure (e.g. stress test, cath, DCCV, TEE, etc)?   Press F2        :[id number]}   Medication Adjustments/Labs and Tests Ordered: Current medicines are reviewed at length with the patient today.  Concerns regarding medicines are outlined above.   Signed, Mable Fill, Marissa Nestle, NP 01/27/2022, 10:07 AM [place]

## 2022-01-29 ENCOUNTER — Ambulatory Visit: Payer: Medicare Other | Admitting: Nurse Practitioner

## 2022-01-29 ENCOUNTER — Ambulatory Visit: Payer: Medicare Other | Admitting: Physician Assistant

## 2022-01-29 DIAGNOSIS — K219 Gastro-esophageal reflux disease without esophagitis: Secondary | ICD-10-CM | POA: Diagnosis not present

## 2022-01-29 DIAGNOSIS — D630 Anemia in neoplastic disease: Secondary | ICD-10-CM | POA: Diagnosis not present

## 2022-01-29 DIAGNOSIS — I5032 Chronic diastolic (congestive) heart failure: Secondary | ICD-10-CM | POA: Diagnosis not present

## 2022-01-29 DIAGNOSIS — Z7985 Long-term (current) use of injectable non-insulin antidiabetic drugs: Secondary | ICD-10-CM | POA: Diagnosis not present

## 2022-01-29 DIAGNOSIS — Z9181 History of falling: Secondary | ICD-10-CM | POA: Diagnosis not present

## 2022-01-29 DIAGNOSIS — I480 Paroxysmal atrial fibrillation: Secondary | ICD-10-CM | POA: Diagnosis not present

## 2022-01-29 DIAGNOSIS — M1711 Unilateral primary osteoarthritis, right knee: Secondary | ICD-10-CM | POA: Diagnosis not present

## 2022-01-29 DIAGNOSIS — I110 Hypertensive heart disease with heart failure: Secondary | ICD-10-CM | POA: Diagnosis not present

## 2022-01-29 DIAGNOSIS — E1140 Type 2 diabetes mellitus with diabetic neuropathy, unspecified: Secondary | ICD-10-CM | POA: Diagnosis not present

## 2022-01-29 DIAGNOSIS — Z87891 Personal history of nicotine dependence: Secondary | ICD-10-CM | POA: Diagnosis not present

## 2022-01-29 DIAGNOSIS — Z7951 Long term (current) use of inhaled steroids: Secondary | ICD-10-CM | POA: Diagnosis not present

## 2022-01-29 DIAGNOSIS — G4733 Obstructive sleep apnea (adult) (pediatric): Secondary | ICD-10-CM | POA: Diagnosis not present

## 2022-01-29 DIAGNOSIS — J9690 Respiratory failure, unspecified, unspecified whether with hypoxia or hypercapnia: Secondary | ICD-10-CM | POA: Diagnosis not present

## 2022-01-29 DIAGNOSIS — Z79891 Long term (current) use of opiate analgesic: Secondary | ICD-10-CM | POA: Diagnosis not present

## 2022-01-29 DIAGNOSIS — Z7901 Long term (current) use of anticoagulants: Secondary | ICD-10-CM | POA: Diagnosis not present

## 2022-01-30 DIAGNOSIS — D630 Anemia in neoplastic disease: Secondary | ICD-10-CM | POA: Diagnosis not present

## 2022-01-30 DIAGNOSIS — Z7985 Long-term (current) use of injectable non-insulin antidiabetic drugs: Secondary | ICD-10-CM | POA: Diagnosis not present

## 2022-01-30 DIAGNOSIS — Z79891 Long term (current) use of opiate analgesic: Secondary | ICD-10-CM | POA: Diagnosis not present

## 2022-01-30 DIAGNOSIS — E1140 Type 2 diabetes mellitus with diabetic neuropathy, unspecified: Secondary | ICD-10-CM | POA: Diagnosis not present

## 2022-01-30 DIAGNOSIS — J9690 Respiratory failure, unspecified, unspecified whether with hypoxia or hypercapnia: Secondary | ICD-10-CM | POA: Diagnosis not present

## 2022-01-30 DIAGNOSIS — K219 Gastro-esophageal reflux disease without esophagitis: Secondary | ICD-10-CM | POA: Diagnosis not present

## 2022-01-30 DIAGNOSIS — I480 Paroxysmal atrial fibrillation: Secondary | ICD-10-CM | POA: Diagnosis not present

## 2022-01-30 DIAGNOSIS — I5032 Chronic diastolic (congestive) heart failure: Secondary | ICD-10-CM | POA: Diagnosis not present

## 2022-01-30 DIAGNOSIS — I110 Hypertensive heart disease with heart failure: Secondary | ICD-10-CM | POA: Diagnosis not present

## 2022-01-30 DIAGNOSIS — Z7901 Long term (current) use of anticoagulants: Secondary | ICD-10-CM | POA: Diagnosis not present

## 2022-01-30 DIAGNOSIS — Z7951 Long term (current) use of inhaled steroids: Secondary | ICD-10-CM | POA: Diagnosis not present

## 2022-01-30 DIAGNOSIS — Z9181 History of falling: Secondary | ICD-10-CM | POA: Diagnosis not present

## 2022-01-30 DIAGNOSIS — M1711 Unilateral primary osteoarthritis, right knee: Secondary | ICD-10-CM | POA: Diagnosis not present

## 2022-01-30 DIAGNOSIS — G4733 Obstructive sleep apnea (adult) (pediatric): Secondary | ICD-10-CM | POA: Diagnosis not present

## 2022-01-30 DIAGNOSIS — Z87891 Personal history of nicotine dependence: Secondary | ICD-10-CM | POA: Diagnosis not present

## 2022-02-03 DIAGNOSIS — Z79891 Long term (current) use of opiate analgesic: Secondary | ICD-10-CM | POA: Diagnosis not present

## 2022-02-03 DIAGNOSIS — J9690 Respiratory failure, unspecified, unspecified whether with hypoxia or hypercapnia: Secondary | ICD-10-CM | POA: Diagnosis not present

## 2022-02-03 DIAGNOSIS — Z7901 Long term (current) use of anticoagulants: Secondary | ICD-10-CM | POA: Diagnosis not present

## 2022-02-03 DIAGNOSIS — K219 Gastro-esophageal reflux disease without esophagitis: Secondary | ICD-10-CM | POA: Diagnosis not present

## 2022-02-03 DIAGNOSIS — Z87891 Personal history of nicotine dependence: Secondary | ICD-10-CM | POA: Diagnosis not present

## 2022-02-03 DIAGNOSIS — Z9181 History of falling: Secondary | ICD-10-CM | POA: Diagnosis not present

## 2022-02-03 DIAGNOSIS — D630 Anemia in neoplastic disease: Secondary | ICD-10-CM | POA: Diagnosis not present

## 2022-02-03 DIAGNOSIS — E1140 Type 2 diabetes mellitus with diabetic neuropathy, unspecified: Secondary | ICD-10-CM | POA: Diagnosis not present

## 2022-02-03 DIAGNOSIS — M1711 Unilateral primary osteoarthritis, right knee: Secondary | ICD-10-CM | POA: Diagnosis not present

## 2022-02-03 DIAGNOSIS — Z7951 Long term (current) use of inhaled steroids: Secondary | ICD-10-CM | POA: Diagnosis not present

## 2022-02-03 DIAGNOSIS — G4733 Obstructive sleep apnea (adult) (pediatric): Secondary | ICD-10-CM | POA: Diagnosis not present

## 2022-02-03 DIAGNOSIS — I480 Paroxysmal atrial fibrillation: Secondary | ICD-10-CM | POA: Diagnosis not present

## 2022-02-03 DIAGNOSIS — I110 Hypertensive heart disease with heart failure: Secondary | ICD-10-CM | POA: Diagnosis not present

## 2022-02-03 DIAGNOSIS — Z7985 Long-term (current) use of injectable non-insulin antidiabetic drugs: Secondary | ICD-10-CM | POA: Diagnosis not present

## 2022-02-03 DIAGNOSIS — I5032 Chronic diastolic (congestive) heart failure: Secondary | ICD-10-CM | POA: Diagnosis not present

## 2022-02-04 DIAGNOSIS — I480 Paroxysmal atrial fibrillation: Secondary | ICD-10-CM | POA: Diagnosis not present

## 2022-02-04 DIAGNOSIS — D630 Anemia in neoplastic disease: Secondary | ICD-10-CM | POA: Diagnosis not present

## 2022-02-04 DIAGNOSIS — Z79891 Long term (current) use of opiate analgesic: Secondary | ICD-10-CM | POA: Diagnosis not present

## 2022-02-04 DIAGNOSIS — K219 Gastro-esophageal reflux disease without esophagitis: Secondary | ICD-10-CM | POA: Diagnosis not present

## 2022-02-04 DIAGNOSIS — I5032 Chronic diastolic (congestive) heart failure: Secondary | ICD-10-CM | POA: Diagnosis not present

## 2022-02-04 DIAGNOSIS — Z7951 Long term (current) use of inhaled steroids: Secondary | ICD-10-CM | POA: Diagnosis not present

## 2022-02-04 DIAGNOSIS — I110 Hypertensive heart disease with heart failure: Secondary | ICD-10-CM | POA: Diagnosis not present

## 2022-02-04 DIAGNOSIS — Z7985 Long-term (current) use of injectable non-insulin antidiabetic drugs: Secondary | ICD-10-CM | POA: Diagnosis not present

## 2022-02-04 DIAGNOSIS — M1711 Unilateral primary osteoarthritis, right knee: Secondary | ICD-10-CM | POA: Diagnosis not present

## 2022-02-04 DIAGNOSIS — Z7901 Long term (current) use of anticoagulants: Secondary | ICD-10-CM | POA: Diagnosis not present

## 2022-02-04 DIAGNOSIS — G4733 Obstructive sleep apnea (adult) (pediatric): Secondary | ICD-10-CM | POA: Diagnosis not present

## 2022-02-04 DIAGNOSIS — Z87891 Personal history of nicotine dependence: Secondary | ICD-10-CM | POA: Diagnosis not present

## 2022-02-04 DIAGNOSIS — Z9181 History of falling: Secondary | ICD-10-CM | POA: Diagnosis not present

## 2022-02-04 DIAGNOSIS — E1140 Type 2 diabetes mellitus with diabetic neuropathy, unspecified: Secondary | ICD-10-CM | POA: Diagnosis not present

## 2022-02-04 DIAGNOSIS — J9690 Respiratory failure, unspecified, unspecified whether with hypoxia or hypercapnia: Secondary | ICD-10-CM | POA: Diagnosis not present

## 2022-02-05 DIAGNOSIS — Z79891 Long term (current) use of opiate analgesic: Secondary | ICD-10-CM | POA: Diagnosis not present

## 2022-02-05 DIAGNOSIS — M1711 Unilateral primary osteoarthritis, right knee: Secondary | ICD-10-CM | POA: Diagnosis not present

## 2022-02-05 DIAGNOSIS — Z7985 Long-term (current) use of injectable non-insulin antidiabetic drugs: Secondary | ICD-10-CM | POA: Diagnosis not present

## 2022-02-05 DIAGNOSIS — D630 Anemia in neoplastic disease: Secondary | ICD-10-CM | POA: Diagnosis not present

## 2022-02-05 DIAGNOSIS — E1140 Type 2 diabetes mellitus with diabetic neuropathy, unspecified: Secondary | ICD-10-CM | POA: Diagnosis not present

## 2022-02-05 DIAGNOSIS — I5032 Chronic diastolic (congestive) heart failure: Secondary | ICD-10-CM | POA: Diagnosis not present

## 2022-02-05 DIAGNOSIS — I480 Paroxysmal atrial fibrillation: Secondary | ICD-10-CM | POA: Diagnosis not present

## 2022-02-05 DIAGNOSIS — I110 Hypertensive heart disease with heart failure: Secondary | ICD-10-CM | POA: Diagnosis not present

## 2022-02-05 DIAGNOSIS — G4733 Obstructive sleep apnea (adult) (pediatric): Secondary | ICD-10-CM | POA: Diagnosis not present

## 2022-02-05 DIAGNOSIS — Z7951 Long term (current) use of inhaled steroids: Secondary | ICD-10-CM | POA: Diagnosis not present

## 2022-02-05 DIAGNOSIS — Z7901 Long term (current) use of anticoagulants: Secondary | ICD-10-CM | POA: Diagnosis not present

## 2022-02-05 DIAGNOSIS — K219 Gastro-esophageal reflux disease without esophagitis: Secondary | ICD-10-CM | POA: Diagnosis not present

## 2022-02-05 DIAGNOSIS — Z87891 Personal history of nicotine dependence: Secondary | ICD-10-CM | POA: Diagnosis not present

## 2022-02-05 DIAGNOSIS — Z9181 History of falling: Secondary | ICD-10-CM | POA: Diagnosis not present

## 2022-02-05 DIAGNOSIS — J9690 Respiratory failure, unspecified, unspecified whether with hypoxia or hypercapnia: Secondary | ICD-10-CM | POA: Diagnosis not present

## 2022-02-06 DIAGNOSIS — K219 Gastro-esophageal reflux disease without esophagitis: Secondary | ICD-10-CM | POA: Diagnosis not present

## 2022-02-06 DIAGNOSIS — E1140 Type 2 diabetes mellitus with diabetic neuropathy, unspecified: Secondary | ICD-10-CM | POA: Diagnosis not present

## 2022-02-06 DIAGNOSIS — Z9181 History of falling: Secondary | ICD-10-CM | POA: Diagnosis not present

## 2022-02-06 DIAGNOSIS — M1711 Unilateral primary osteoarthritis, right knee: Secondary | ICD-10-CM | POA: Diagnosis not present

## 2022-02-06 DIAGNOSIS — D630 Anemia in neoplastic disease: Secondary | ICD-10-CM | POA: Diagnosis not present

## 2022-02-06 DIAGNOSIS — I5032 Chronic diastolic (congestive) heart failure: Secondary | ICD-10-CM | POA: Diagnosis not present

## 2022-02-06 DIAGNOSIS — J9690 Respiratory failure, unspecified, unspecified whether with hypoxia or hypercapnia: Secondary | ICD-10-CM | POA: Diagnosis not present

## 2022-02-06 DIAGNOSIS — Z79891 Long term (current) use of opiate analgesic: Secondary | ICD-10-CM | POA: Diagnosis not present

## 2022-02-06 DIAGNOSIS — Z87891 Personal history of nicotine dependence: Secondary | ICD-10-CM | POA: Diagnosis not present

## 2022-02-06 DIAGNOSIS — I480 Paroxysmal atrial fibrillation: Secondary | ICD-10-CM | POA: Diagnosis not present

## 2022-02-06 DIAGNOSIS — G4733 Obstructive sleep apnea (adult) (pediatric): Secondary | ICD-10-CM | POA: Diagnosis not present

## 2022-02-06 DIAGNOSIS — Z7985 Long-term (current) use of injectable non-insulin antidiabetic drugs: Secondary | ICD-10-CM | POA: Diagnosis not present

## 2022-02-06 DIAGNOSIS — Z7901 Long term (current) use of anticoagulants: Secondary | ICD-10-CM | POA: Diagnosis not present

## 2022-02-06 DIAGNOSIS — I110 Hypertensive heart disease with heart failure: Secondary | ICD-10-CM | POA: Diagnosis not present

## 2022-02-06 DIAGNOSIS — Z7951 Long term (current) use of inhaled steroids: Secondary | ICD-10-CM | POA: Diagnosis not present

## 2022-02-07 DIAGNOSIS — G4733 Obstructive sleep apnea (adult) (pediatric): Secondary | ICD-10-CM | POA: Diagnosis not present

## 2022-02-07 DIAGNOSIS — Z7985 Long-term (current) use of injectable non-insulin antidiabetic drugs: Secondary | ICD-10-CM | POA: Diagnosis not present

## 2022-02-07 DIAGNOSIS — Z7951 Long term (current) use of inhaled steroids: Secondary | ICD-10-CM | POA: Diagnosis not present

## 2022-02-07 DIAGNOSIS — K219 Gastro-esophageal reflux disease without esophagitis: Secondary | ICD-10-CM | POA: Diagnosis not present

## 2022-02-07 DIAGNOSIS — I110 Hypertensive heart disease with heart failure: Secondary | ICD-10-CM | POA: Diagnosis not present

## 2022-02-07 DIAGNOSIS — I5032 Chronic diastolic (congestive) heart failure: Secondary | ICD-10-CM | POA: Diagnosis not present

## 2022-02-07 DIAGNOSIS — Z79891 Long term (current) use of opiate analgesic: Secondary | ICD-10-CM | POA: Diagnosis not present

## 2022-02-07 DIAGNOSIS — Z7901 Long term (current) use of anticoagulants: Secondary | ICD-10-CM | POA: Diagnosis not present

## 2022-02-07 DIAGNOSIS — D630 Anemia in neoplastic disease: Secondary | ICD-10-CM | POA: Diagnosis not present

## 2022-02-07 DIAGNOSIS — I480 Paroxysmal atrial fibrillation: Secondary | ICD-10-CM | POA: Diagnosis not present

## 2022-02-07 DIAGNOSIS — E1140 Type 2 diabetes mellitus with diabetic neuropathy, unspecified: Secondary | ICD-10-CM | POA: Diagnosis not present

## 2022-02-07 DIAGNOSIS — J9690 Respiratory failure, unspecified, unspecified whether with hypoxia or hypercapnia: Secondary | ICD-10-CM | POA: Diagnosis not present

## 2022-02-07 DIAGNOSIS — M1711 Unilateral primary osteoarthritis, right knee: Secondary | ICD-10-CM | POA: Diagnosis not present

## 2022-02-07 DIAGNOSIS — Z87891 Personal history of nicotine dependence: Secondary | ICD-10-CM | POA: Diagnosis not present

## 2022-02-07 DIAGNOSIS — Z9181 History of falling: Secondary | ICD-10-CM | POA: Diagnosis not present

## 2022-02-08 DIAGNOSIS — M1711 Unilateral primary osteoarthritis, right knee: Secondary | ICD-10-CM | POA: Diagnosis not present

## 2022-02-08 DIAGNOSIS — Z7985 Long-term (current) use of injectable non-insulin antidiabetic drugs: Secondary | ICD-10-CM | POA: Diagnosis not present

## 2022-02-08 DIAGNOSIS — I110 Hypertensive heart disease with heart failure: Secondary | ICD-10-CM | POA: Diagnosis not present

## 2022-02-08 DIAGNOSIS — I480 Paroxysmal atrial fibrillation: Secondary | ICD-10-CM | POA: Diagnosis not present

## 2022-02-08 DIAGNOSIS — Z79891 Long term (current) use of opiate analgesic: Secondary | ICD-10-CM | POA: Diagnosis not present

## 2022-02-08 DIAGNOSIS — Z7901 Long term (current) use of anticoagulants: Secondary | ICD-10-CM | POA: Diagnosis not present

## 2022-02-08 DIAGNOSIS — G4733 Obstructive sleep apnea (adult) (pediatric): Secondary | ICD-10-CM | POA: Diagnosis not present

## 2022-02-08 DIAGNOSIS — J9690 Respiratory failure, unspecified, unspecified whether with hypoxia or hypercapnia: Secondary | ICD-10-CM | POA: Diagnosis not present

## 2022-02-08 DIAGNOSIS — I5032 Chronic diastolic (congestive) heart failure: Secondary | ICD-10-CM | POA: Diagnosis not present

## 2022-02-08 DIAGNOSIS — Z7951 Long term (current) use of inhaled steroids: Secondary | ICD-10-CM | POA: Diagnosis not present

## 2022-02-08 DIAGNOSIS — E1140 Type 2 diabetes mellitus with diabetic neuropathy, unspecified: Secondary | ICD-10-CM | POA: Diagnosis not present

## 2022-02-08 DIAGNOSIS — D630 Anemia in neoplastic disease: Secondary | ICD-10-CM | POA: Diagnosis not present

## 2022-02-08 DIAGNOSIS — K219 Gastro-esophageal reflux disease without esophagitis: Secondary | ICD-10-CM | POA: Diagnosis not present

## 2022-02-08 DIAGNOSIS — Z87891 Personal history of nicotine dependence: Secondary | ICD-10-CM | POA: Diagnosis not present

## 2022-02-08 DIAGNOSIS — Z9181 History of falling: Secondary | ICD-10-CM | POA: Diagnosis not present

## 2022-02-10 ENCOUNTER — Ambulatory Visit
Admission: RE | Admit: 2022-02-10 | Discharge: 2022-02-10 | Disposition: A | Discharge status: Home / Self Care | Payer: Medicare Other | Source: Ambulatory Visit | Attending: Family Medicine | Admitting: Family Medicine

## 2022-02-10 ENCOUNTER — Other Ambulatory Visit: Payer: Self-pay | Admitting: Family Medicine

## 2022-02-10 DIAGNOSIS — G4733 Obstructive sleep apnea (adult) (pediatric): Secondary | ICD-10-CM | POA: Diagnosis not present

## 2022-02-10 DIAGNOSIS — D696 Thrombocytopenia, unspecified: Secondary | ICD-10-CM | POA: Diagnosis not present

## 2022-02-10 DIAGNOSIS — Z7951 Long term (current) use of inhaled steroids: Secondary | ICD-10-CM | POA: Diagnosis not present

## 2022-02-10 DIAGNOSIS — I10 Essential (primary) hypertension: Secondary | ICD-10-CM | POA: Diagnosis not present

## 2022-02-10 DIAGNOSIS — D649 Anemia, unspecified: Secondary | ICD-10-CM | POA: Diagnosis not present

## 2022-02-10 DIAGNOSIS — M1711 Unilateral primary osteoarthritis, right knee: Secondary | ICD-10-CM | POA: Diagnosis not present

## 2022-02-10 DIAGNOSIS — K5900 Constipation, unspecified: Secondary | ICD-10-CM | POA: Diagnosis not present

## 2022-02-10 DIAGNOSIS — K219 Gastro-esophageal reflux disease without esophagitis: Secondary | ICD-10-CM | POA: Diagnosis not present

## 2022-02-10 DIAGNOSIS — Z9181 History of falling: Secondary | ICD-10-CM | POA: Diagnosis not present

## 2022-02-10 DIAGNOSIS — Z7901 Long term (current) use of anticoagulants: Secondary | ICD-10-CM | POA: Diagnosis not present

## 2022-02-10 DIAGNOSIS — Z79891 Long term (current) use of opiate analgesic: Secondary | ICD-10-CM | POA: Diagnosis not present

## 2022-02-10 DIAGNOSIS — I5032 Chronic diastolic (congestive) heart failure: Secondary | ICD-10-CM | POA: Diagnosis not present

## 2022-02-10 DIAGNOSIS — I482 Chronic atrial fibrillation, unspecified: Secondary | ICD-10-CM | POA: Diagnosis not present

## 2022-02-10 DIAGNOSIS — I700 Atherosclerosis of aorta: Secondary | ICD-10-CM | POA: Diagnosis not present

## 2022-02-10 DIAGNOSIS — I480 Paroxysmal atrial fibrillation: Secondary | ICD-10-CM | POA: Diagnosis not present

## 2022-02-10 DIAGNOSIS — I110 Hypertensive heart disease with heart failure: Secondary | ICD-10-CM | POA: Diagnosis not present

## 2022-02-10 DIAGNOSIS — E1140 Type 2 diabetes mellitus with diabetic neuropathy, unspecified: Secondary | ICD-10-CM | POA: Diagnosis not present

## 2022-02-10 DIAGNOSIS — D630 Anemia in neoplastic disease: Secondary | ICD-10-CM | POA: Diagnosis not present

## 2022-02-10 DIAGNOSIS — J189 Pneumonia, unspecified organism: Secondary | ICD-10-CM

## 2022-02-10 DIAGNOSIS — D72829 Elevated white blood cell count, unspecified: Secondary | ICD-10-CM | POA: Diagnosis not present

## 2022-02-10 DIAGNOSIS — Z87891 Personal history of nicotine dependence: Secondary | ICD-10-CM | POA: Diagnosis not present

## 2022-02-10 DIAGNOSIS — N179 Acute kidney failure, unspecified: Secondary | ICD-10-CM | POA: Diagnosis not present

## 2022-02-10 DIAGNOSIS — J9690 Respiratory failure, unspecified, unspecified whether with hypoxia or hypercapnia: Secondary | ICD-10-CM | POA: Diagnosis not present

## 2022-02-10 DIAGNOSIS — Z7985 Long-term (current) use of injectable non-insulin antidiabetic drugs: Secondary | ICD-10-CM | POA: Diagnosis not present

## 2022-02-10 DIAGNOSIS — K7460 Unspecified cirrhosis of liver: Secondary | ICD-10-CM | POA: Diagnosis not present

## 2022-02-11 DIAGNOSIS — Z87891 Personal history of nicotine dependence: Secondary | ICD-10-CM | POA: Diagnosis not present

## 2022-02-11 DIAGNOSIS — Z7901 Long term (current) use of anticoagulants: Secondary | ICD-10-CM | POA: Diagnosis not present

## 2022-02-11 DIAGNOSIS — I5032 Chronic diastolic (congestive) heart failure: Secondary | ICD-10-CM | POA: Diagnosis not present

## 2022-02-11 DIAGNOSIS — I110 Hypertensive heart disease with heart failure: Secondary | ICD-10-CM | POA: Diagnosis not present

## 2022-02-11 DIAGNOSIS — I480 Paroxysmal atrial fibrillation: Secondary | ICD-10-CM | POA: Diagnosis not present

## 2022-02-11 DIAGNOSIS — E1140 Type 2 diabetes mellitus with diabetic neuropathy, unspecified: Secondary | ICD-10-CM | POA: Diagnosis not present

## 2022-02-11 DIAGNOSIS — Z7951 Long term (current) use of inhaled steroids: Secondary | ICD-10-CM | POA: Diagnosis not present

## 2022-02-11 DIAGNOSIS — Z79891 Long term (current) use of opiate analgesic: Secondary | ICD-10-CM | POA: Diagnosis not present

## 2022-02-11 DIAGNOSIS — D630 Anemia in neoplastic disease: Secondary | ICD-10-CM | POA: Diagnosis not present

## 2022-02-11 DIAGNOSIS — J9690 Respiratory failure, unspecified, unspecified whether with hypoxia or hypercapnia: Secondary | ICD-10-CM | POA: Diagnosis not present

## 2022-02-11 DIAGNOSIS — M1711 Unilateral primary osteoarthritis, right knee: Secondary | ICD-10-CM | POA: Diagnosis not present

## 2022-02-11 DIAGNOSIS — G4733 Obstructive sleep apnea (adult) (pediatric): Secondary | ICD-10-CM | POA: Diagnosis not present

## 2022-02-11 DIAGNOSIS — Z9181 History of falling: Secondary | ICD-10-CM | POA: Diagnosis not present

## 2022-02-11 DIAGNOSIS — Z7985 Long-term (current) use of injectable non-insulin antidiabetic drugs: Secondary | ICD-10-CM | POA: Diagnosis not present

## 2022-02-11 DIAGNOSIS — K219 Gastro-esophageal reflux disease without esophagitis: Secondary | ICD-10-CM | POA: Diagnosis not present

## 2022-02-13 DIAGNOSIS — Z7901 Long term (current) use of anticoagulants: Secondary | ICD-10-CM | POA: Diagnosis not present

## 2022-02-13 DIAGNOSIS — Z7985 Long-term (current) use of injectable non-insulin antidiabetic drugs: Secondary | ICD-10-CM | POA: Diagnosis not present

## 2022-02-13 DIAGNOSIS — Z79891 Long term (current) use of opiate analgesic: Secondary | ICD-10-CM | POA: Diagnosis not present

## 2022-02-13 DIAGNOSIS — M1711 Unilateral primary osteoarthritis, right knee: Secondary | ICD-10-CM | POA: Diagnosis not present

## 2022-02-13 DIAGNOSIS — I480 Paroxysmal atrial fibrillation: Secondary | ICD-10-CM | POA: Diagnosis not present

## 2022-02-13 DIAGNOSIS — K219 Gastro-esophageal reflux disease without esophagitis: Secondary | ICD-10-CM | POA: Diagnosis not present

## 2022-02-13 DIAGNOSIS — Z7951 Long term (current) use of inhaled steroids: Secondary | ICD-10-CM | POA: Diagnosis not present

## 2022-02-13 DIAGNOSIS — I110 Hypertensive heart disease with heart failure: Secondary | ICD-10-CM | POA: Diagnosis not present

## 2022-02-13 DIAGNOSIS — G4733 Obstructive sleep apnea (adult) (pediatric): Secondary | ICD-10-CM | POA: Diagnosis not present

## 2022-02-13 DIAGNOSIS — J9690 Respiratory failure, unspecified, unspecified whether with hypoxia or hypercapnia: Secondary | ICD-10-CM | POA: Diagnosis not present

## 2022-02-13 DIAGNOSIS — Z87891 Personal history of nicotine dependence: Secondary | ICD-10-CM | POA: Diagnosis not present

## 2022-02-13 DIAGNOSIS — E1140 Type 2 diabetes mellitus with diabetic neuropathy, unspecified: Secondary | ICD-10-CM | POA: Diagnosis not present

## 2022-02-13 DIAGNOSIS — I5032 Chronic diastolic (congestive) heart failure: Secondary | ICD-10-CM | POA: Diagnosis not present

## 2022-02-13 DIAGNOSIS — D630 Anemia in neoplastic disease: Secondary | ICD-10-CM | POA: Diagnosis not present

## 2022-02-13 DIAGNOSIS — Z9181 History of falling: Secondary | ICD-10-CM | POA: Diagnosis not present

## 2022-02-14 DIAGNOSIS — I480 Paroxysmal atrial fibrillation: Secondary | ICD-10-CM | POA: Diagnosis not present

## 2022-02-14 DIAGNOSIS — D630 Anemia in neoplastic disease: Secondary | ICD-10-CM | POA: Diagnosis not present

## 2022-02-14 DIAGNOSIS — Z7951 Long term (current) use of inhaled steroids: Secondary | ICD-10-CM | POA: Diagnosis not present

## 2022-02-14 DIAGNOSIS — K219 Gastro-esophageal reflux disease without esophagitis: Secondary | ICD-10-CM | POA: Diagnosis not present

## 2022-02-14 DIAGNOSIS — I110 Hypertensive heart disease with heart failure: Secondary | ICD-10-CM | POA: Diagnosis not present

## 2022-02-14 DIAGNOSIS — Z9181 History of falling: Secondary | ICD-10-CM | POA: Diagnosis not present

## 2022-02-14 DIAGNOSIS — Z7985 Long-term (current) use of injectable non-insulin antidiabetic drugs: Secondary | ICD-10-CM | POA: Diagnosis not present

## 2022-02-14 DIAGNOSIS — G4733 Obstructive sleep apnea (adult) (pediatric): Secondary | ICD-10-CM | POA: Diagnosis not present

## 2022-02-14 DIAGNOSIS — Z87891 Personal history of nicotine dependence: Secondary | ICD-10-CM | POA: Diagnosis not present

## 2022-02-14 DIAGNOSIS — Z79891 Long term (current) use of opiate analgesic: Secondary | ICD-10-CM | POA: Diagnosis not present

## 2022-02-14 DIAGNOSIS — E1140 Type 2 diabetes mellitus with diabetic neuropathy, unspecified: Secondary | ICD-10-CM | POA: Diagnosis not present

## 2022-02-14 DIAGNOSIS — J9690 Respiratory failure, unspecified, unspecified whether with hypoxia or hypercapnia: Secondary | ICD-10-CM | POA: Diagnosis not present

## 2022-02-14 DIAGNOSIS — I5032 Chronic diastolic (congestive) heart failure: Secondary | ICD-10-CM | POA: Diagnosis not present

## 2022-02-14 DIAGNOSIS — M1711 Unilateral primary osteoarthritis, right knee: Secondary | ICD-10-CM | POA: Diagnosis not present

## 2022-02-14 DIAGNOSIS — Z7901 Long term (current) use of anticoagulants: Secondary | ICD-10-CM | POA: Diagnosis not present

## 2022-02-16 NOTE — Progress Notes (Signed)
Office Visit    Patient Name: Steve Jensen Date of Encounter: 02/17/2022  Primary Care Provider:  [name], MD Primary Cardiologist:  Larae Grooms, MD Primary Electrophysiologist: None  Chief Complaint    Steve Jensen is a 81 y.o. male with PMH of chronic A-fib (on Eliquis), HTN, HFpEF, DM type II, GERD, HLD, morbid obesity, OSA (on BiPAP), prostate CA who presents today for hospital follow-up.   Past Medical History    Past Medical History:  Diagnosis Date   Anemia    Diabetes mellitus type II, controlled ([place])    with neuropathy   Dysrhythmia    A-Fib. cardioversion done   GERD (gastroesophageal reflux disease)    Hyperlipidemia    Hypertension    Morbid obesity ([place])    Neuromuscular disorder ([place])    feet   OSA (obstructive sleep apnea)    On BiPAP at 15/11cm H2O   Prostate cancer ([place])    Shingles    on face Nov 2010   Urinary incontinence    Past Surgical History:  Procedure Laterality Date   CARDIOVERSION N/A 09/06/2013   Procedure: CARDIOVERSION;  Surgeon: [name], MD;  Location: [facility] ENDOSCOPY;  Service: Cardiovascular;  Laterality: N/A;   EYE SURGERY     cataract surgery bilat; surgery to correct droopy eyelids    FLEXIBLE SIGMOIDOSCOPY N/A 12/24/2021   Procedure: FLEXIBLE SIGMOIDOSCOPY;  Surgeon: Harvel Quale, MD;  [facility]: [facility] ENDO SUITE;  Service: Gastroenterology;  Laterality: N/A;   IR ANGIOGRAM SELECTIVE EACH ADDITIONAL VESSEL  02/15/2018   IR ANGIOGRAM SELECTIVE EACH ADDITIONAL VESSEL  02/15/2018   IR ANGIOGRAM SELECTIVE EACH ADDITIONAL VESSEL  02/15/2018   IR ANGIOGRAM SELECTIVE EACH ADDITIONAL VESSEL  02/15/2018   IR ANGIOGRAM SELECTIVE EACH ADDITIONAL VESSEL  02/15/2018   IR ANGIOGRAM VISCERAL SELECTIVE  02/15/2018   IR ANGIOGRAM VISCERAL SELECTIVE  02/15/2018   IR EMBO TUMOR ORGAN ISCHEMIA INFARCT INC GUIDE ROADMAPPING  02/15/2018   IR RADIOLOGIST EVAL & MGMT  01/26/2018   IR RADIOLOGIST EVAL & MGMT  03/17/2018    IR RADIOLOGIST EVAL & MGMT  06/29/2018   IR RADIOLOGIST EVAL & MGMT  07/27/2018   IR RADIOLOGIST EVAL & MGMT  10/28/2018   IR RADIOLOGIST EVAL & MGMT  12/14/2018   IR RADIOLOGIST EVAL & MGMT  03/17/2019   IR RADIOLOGIST EVAL & MGMT  07/14/2019   IR RADIOLOGIST EVAL & MGMT  12/27/2019   IR RADIOLOGIST EVAL & MGMT  08/21/2020   IR RADIOLOGIST EVAL & MGMT  02/28/2021   IR RADIOLOGIST EVAL & MGMT  06/25/2021   IR RADIOLOGIST EVAL & MGMT  10/18/2021   IR US GUIDE VASC ACCESS RIGHT  02/15/2018   knee surgery bilat      PROSTATE SURGERY     RADIOLOGY WITH ANESTHESIA N/A 11/24/2018   Procedure: MICROWAVE THERMAL ABLATION LIVER;  Surgeon: Sandi Mariscal, MD;  Location: [facility] ORS;  Service: Anesthesiology;  Laterality: N/A;   right shoulder rotator cuff surgery     TONSILLECTOMY      Allergies  Allergies  Allergen Reactions   Lipitor [Atorvastatin] Other (See Comments)    Muscle aches   Simvastatin Other (See Comments)    Muscle aches   Zithromax [Azithromycin] Other (See Comments)    GI-SIDE EFFECTS   Amlodipine Other (See Comments)    History of Present Illness    Steve Jensen is a 81 year old male with the above-mentioned past medical history who presents today for hospital  follow-up.  [name] has been followed by Dr. Irish Lack since 2014 for management of CAD.  He was diagnosed with A-fib in 2015 and underwent DCCV to sinus rhythm on Eliquis for stroke prophylaxis.  He is currently on rate control with metoprolol and Cardizem.  He was recently admitted 11/2021 with sepsis secondary community-acquired PNA.  He was also noted to have hypoxic respiratory failure.  He was discharged with resolution of hypoxia and PNA.  He will have repeat chest x-ray completed in 6 to 8 weeks to rule out resolution of infiltrates.  Blood pressures were overall stable and patient was euvolemic upon discharge.    [name] presents today for hospital follow up with his daughter.Since last being seen in the office patient  reports that he is feeling well since being discharged.  He reports that his blood pressures were running low and his ramapril was discontinued by his PCP. He has noted that his HR has been elevated since his discharge and patient was in AF on ascultation that is asymptomatic. Patient denies chest pain, palpitations, dyspnea, PND, orthopnea, nausea, vomiting, dizziness, syncope, edema, weight gain, or early satiety.     Home Medications    Current Outpatient Medications  Medication Sig Dispense Refill   acetaminophen (TYLENOL) 500 MG tablet Take 2 tablets (1,000 mg total) by mouth every 8 (eight) hours as needed for mild pain or headache (or Fever >/= 101).     allopurinol (ZYLOPRIM) 100 MG tablet Take 100 mg by mouth daily.     apixaban (ELIQUIS) 5 MG TABS tablet TAKE 1 TABLET(5 MG) BY MOUTH TWICE DAILY 180 tablet 1   bisacodyl (DULCOLAX) 10 MG suppository Place 1 suppository (10 mg total) rectally daily as needed for moderate constipation.     budesonide-formoterol (SYMBICORT) 80-4.5 MCG/ACT inhaler Inhale 2 puffs into the lungs in the morning and at bedtime.     diclofenac Sodium (VOLTAREN) 1 % GEL Apply 4 g topically 4 (four) times daily.     diltiazem (CARDIZEM CD) 240 MG 24 hr capsule TAKE 1 CAPSULE BY MOUTH  DAILY 90 capsule 3   fish oil-omega-3 fatty acids 1000 MG capsule Take 2 g by mouth 2 (two) times daily.      furosemide (LASIX) 40 MG tablet Take 1 tablet (40 mg total) by mouth daily as needed for fluid or edema.     Ipratropium-Albuterol (COMBIVENT RESPIMAT) 20-100 MCG/ACT AERS respimat Inhale 1 puff into the lungs every 6 (six) hours as needed for wheezing or shortness of breath.     loratadine (CLARITIN) 10 MG tablet Take 10 mg by mouth daily as needed for allergies.      losartan (COZAAR) 25 MG tablet Take 0.5 tablets (12.5 mg total) by mouth daily.     metFORMIN (GLUCOPHAGE) 1000 MG tablet Take 1,000 mg by mouth 2 (two) times daily.  0   metoprolol succinate (TOPROL-XL) 50 MG 24  hr tablet Take 50 mg by mouth daily.      Multiple Vitamins-Minerals (MULTIVITAMIN WITH MINERALS) tablet Take 1 tablet by mouth daily.     omeprazole (PRILOSEC) 20 MG capsule Take 2 capsules (40 mg total) by mouth 2 (two) times daily before a meal.     polyethylene glycol (MIRALAX / GLYCOLAX) 17 g packet Take 17 g by mouth 3 (three) times daily. Titrate down to PRN base on further resolution of his ileus.     potassium chloride SA (KLOR-CON M) 20 MEQ tablet TAKE 1 TABLET BY MOUTH  DAILY  AS NEEDED WHILE  TAKING FUROSEMIDE (Patient taking differently: Take 20 mEq by mouth daily.) 90 tablet 3   pregabalin (LYRICA) 100 MG capsule Take 100 mg by mouth 2 (two) times a day.      traMADol (ULTRAM) 50 MG tablet Take 1 tablet (50 mg total) by mouth every 8 (eight) hours as needed for severe pain. 20 tablet 0   ZETIA 10 MG tablet Take 10 mg by mouth daily.      MOUNJARO 12.5 MG/0.5ML Pen Inject 12.5 mg into the skin once a week.     No current facility-administered medications for this visit.     Review of Systems  Please see the history of present illness.    (+) (+)  All other systems reviewed and are otherwise negative except as noted above.  Physical Exam    Wt Readings from Last 3 Encounters:  02/17/22 256 lb (116.1 kg)  12/17/21 290 lb 12.6 oz (131.9 kg)  08/21/21 (!) 302 lb (137 kg)   VS: Vitals:   02/17/22 1543  BP: (!) 110/56  Pulse: (!) 101  SpO2: 95%  ,Body mass index is 35.7 kg/m.  Constitutional:      Appearance: Healthy appearance. Not in distress.  Neck:     Vascular: JVD normal.  Pulmonary:     Effort: Pulmonary effort is normal.     Breath sounds: No wheezing. No rales. Diminished in the bases Cardiovascular:     Irregularly irregular normal S1. Normal S2.      Murmurs: There is no murmur.  Edema:    Peripheral edema absent.  Abdominal:     Palpations: Abdomen is soft non tender. There is no hepatomegaly.  Skin:    General: Skin is warm and dry.   Neurological:     General: No focal deficit present.     Mental Status: Alert and oriented to person, place and time.     Cranial Nerves: Cranial nerves are intact.  EKG/LABS/Other Studies Reviewed    ECG personally reviewed by me today - None completed today  Risk Assessment/Calculations:    CHA2DS2-VASc Score = 5   This indicates a 7.2% annual risk of stroke. The patient's score is based upon: CHF History: 1 HTN History: 1 Diabetes History: 1 Stroke History: 0 Vascular Disease History: 0 Age Score: 2 Gender Score: 0      Lab Results  Component Value Date   WBC 12.5 (H) 12/25/2021   HGB 8.3 (L) 12/25/2021   HCT 29.2 (L) 12/25/2021   MCV 71.2 (L) 12/25/2021   PLT 652 (H) 12/25/2021   Lab Results  Component Value Date   CREATININE 0.90 12/26/2021   BUN 11 12/26/2021   NA 141 12/26/2021   K 3.7 12/26/2021   CL 114 (H) 12/26/2021   CO2 21 (L) 12/26/2021   Lab Results  Component Value Date   ALT 23 10/09/2021   AST 16 10/09/2021   ALKPHOS 133 (H) 07/27/2020   BILITOT 0.4 10/09/2021   Lab Results  Component Value Date   CHOL 158 12/18/2014   HDL 31.00 (L) 12/18/2014   LDLCALC 89 12/18/2014   TRIG 188.0 (H) 12/18/2014   CHOLHDL 5 12/18/2014    Lab Results  Component Value Date   HGBA1C 7.6 (H) 12/17/2021    Assessment & Plan    1.  HFpEF: -Patient last 2D echo was completed 2015 -Continue GDMT with Lasix 40 mg, losartan 12.5 mg, Toprol XL 50 mg Low sodium diet, fluid restriction <2L,  and daily weights encouraged. Educated to contact our office for weight gain of 2 lbs overnight or 5 lbs in one week.    2.  Paroxysmal AF: -Patient has increased rate in the  100's and is currently asymptomatic -We will increase Cardizem to 300 mg daily and continue Toprol-XL as noted above -Reports no occult bleeding -Continue Eliquis 5 mg twice daily CHA2DS2-VASc Score = 5 [CHF History: 1, HTN History: 1, Diabetes History: 1, Stroke History: 0, Vascular Disease  History: 0, Age Score: 2, Gender Score: 0].  Therefore, the patient's annual risk of stroke is 7.2 %.       3.  Hypertension: -Patient's blood pressure today was 110/56 -Continue Toprol, Cardizem, and losartan as noted above   4.  Obstructive sleep apnea: -Patient currently on CPAP  Disposition: Follow-up with Larae Grooms, MD or APP in 1 months     Medication Adjustments/Labs and Tests Ordered: Current medicines are reviewed at length with the patient today.  Concerns regarding medicines are outlined above.   Signed, Mable Fill, Marissa Nestle, NP 02/17/2022, 4:40 PM [facility]  Note:  This document was prepared using Dragon voice recognition software and may include unintentional dictation errors.

## 2022-02-17 ENCOUNTER — Ambulatory Visit: Payer: Medicare Other | Attending: Physician Assistant | Admitting: Nurse Practitioner

## 2022-02-17 ENCOUNTER — Encounter: Payer: Self-pay | Admitting: Nurse Practitioner

## 2022-02-17 VITALS — BP 110/56 | HR 101 | Ht 71.0 in | Wt 256.0 lb

## 2022-02-17 DIAGNOSIS — I5032 Chronic diastolic (congestive) heart failure: Secondary | ICD-10-CM

## 2022-02-17 DIAGNOSIS — I10 Essential (primary) hypertension: Secondary | ICD-10-CM | POA: Diagnosis not present

## 2022-02-17 DIAGNOSIS — M1990 Unspecified osteoarthritis, unspecified site: Secondary | ICD-10-CM | POA: Diagnosis not present

## 2022-02-17 DIAGNOSIS — M1711 Unilateral primary osteoarthritis, right knee: Secondary | ICD-10-CM | POA: Diagnosis not present

## 2022-02-17 DIAGNOSIS — G4733 Obstructive sleep apnea (adult) (pediatric): Secondary | ICD-10-CM | POA: Diagnosis not present

## 2022-02-17 DIAGNOSIS — I480 Paroxysmal atrial fibrillation: Secondary | ICD-10-CM | POA: Diagnosis not present

## 2022-02-17 DIAGNOSIS — K219 Gastro-esophageal reflux disease without esophagitis: Secondary | ICD-10-CM | POA: Diagnosis not present

## 2022-02-17 DIAGNOSIS — Z79891 Long term (current) use of opiate analgesic: Secondary | ICD-10-CM | POA: Diagnosis not present

## 2022-02-17 DIAGNOSIS — Z87891 Personal history of nicotine dependence: Secondary | ICD-10-CM | POA: Diagnosis not present

## 2022-02-17 DIAGNOSIS — J9690 Respiratory failure, unspecified, unspecified whether with hypoxia or hypercapnia: Secondary | ICD-10-CM | POA: Diagnosis not present

## 2022-02-17 DIAGNOSIS — I110 Hypertensive heart disease with heart failure: Secondary | ICD-10-CM | POA: Diagnosis not present

## 2022-02-17 DIAGNOSIS — E1140 Type 2 diabetes mellitus with diabetic neuropathy, unspecified: Secondary | ICD-10-CM | POA: Diagnosis not present

## 2022-02-17 DIAGNOSIS — D630 Anemia in neoplastic disease: Secondary | ICD-10-CM | POA: Diagnosis not present

## 2022-02-17 DIAGNOSIS — E782 Mixed hyperlipidemia: Secondary | ICD-10-CM | POA: Diagnosis not present

## 2022-02-17 DIAGNOSIS — Z9181 History of falling: Secondary | ICD-10-CM | POA: Diagnosis not present

## 2022-02-17 DIAGNOSIS — Z7901 Long term (current) use of anticoagulants: Secondary | ICD-10-CM | POA: Diagnosis not present

## 2022-02-17 DIAGNOSIS — Z7985 Long-term (current) use of injectable non-insulin antidiabetic drugs: Secondary | ICD-10-CM | POA: Diagnosis not present

## 2022-02-17 DIAGNOSIS — Z7951 Long term (current) use of inhaled steroids: Secondary | ICD-10-CM | POA: Diagnosis not present

## 2022-02-17 MED ORDER — DILTIAZEM HCL ER COATED BEADS 300 MG PO CP24: 300.0000 mg | ORAL_CAPSULE | Freq: Every day | ORAL | 3 refills | Status: DC

## 2022-02-17 NOTE — Patient Instructions (Signed)
Medication Instructions:   INCREASE Diltiazem one (1) tablet ( 300 mg ) daily.   *If you need a refill on your cardiac medications before your next appointment, please call your pharmacy*   Lab Work:  None ordered.  If you have labs (blood work) drawn today and your tests are completely normal, you will receive your results only by: [place] (if you have MyChart) OR A paper copy in the mail If you have any lab test that is abnormal or we need to change your treatment, we will call you to review the results.   Testing/Procedures:  None ordered.   Follow-Up: At [facility], you and your health needs are our priority.  As part of our continuing mission to provide you with exceptional heart care, we have created designated Provider Care Teams.  These Care Teams include your primary Cardiologist (physician) and Advanced Practice Providers (APPs -  Physician Assistants and Nurse Practitioners) who all work together to provide you with the care you need, when you need it.  We recommend signing up for the patient portal called "MyChart".  Sign up information is provided on this After Visit Summary.  MyChart is used to connect with patients for Virtual Visits (Telemedicine).  Patients are able to view lab/test results, encounter notes, upcoming appointments, etc.  Non-urgent messages can be sent to your provider as well.   To learn more about what you can do with MyChart, go to [email or web address].    Your next appointment:   1 month(s)  The format for your next appointment:   In Person  Provider:   Larae Grooms, MD    [employer]

## 2022-02-19 DIAGNOSIS — Z7985 Long-term (current) use of injectable non-insulin antidiabetic drugs: Secondary | ICD-10-CM | POA: Diagnosis not present

## 2022-02-19 DIAGNOSIS — I480 Paroxysmal atrial fibrillation: Secondary | ICD-10-CM | POA: Diagnosis not present

## 2022-02-19 DIAGNOSIS — Z9181 History of falling: Secondary | ICD-10-CM | POA: Diagnosis not present

## 2022-02-19 DIAGNOSIS — I5032 Chronic diastolic (congestive) heart failure: Secondary | ICD-10-CM | POA: Diagnosis not present

## 2022-02-19 DIAGNOSIS — J9690 Respiratory failure, unspecified, unspecified whether with hypoxia or hypercapnia: Secondary | ICD-10-CM | POA: Diagnosis not present

## 2022-02-19 DIAGNOSIS — D630 Anemia in neoplastic disease: Secondary | ICD-10-CM | POA: Diagnosis not present

## 2022-02-19 DIAGNOSIS — Z87891 Personal history of nicotine dependence: Secondary | ICD-10-CM | POA: Diagnosis not present

## 2022-02-19 DIAGNOSIS — I110 Hypertensive heart disease with heart failure: Secondary | ICD-10-CM | POA: Diagnosis not present

## 2022-02-19 DIAGNOSIS — K219 Gastro-esophageal reflux disease without esophagitis: Secondary | ICD-10-CM | POA: Diagnosis not present

## 2022-02-19 DIAGNOSIS — Z7951 Long term (current) use of inhaled steroids: Secondary | ICD-10-CM | POA: Diagnosis not present

## 2022-02-19 DIAGNOSIS — E1140 Type 2 diabetes mellitus with diabetic neuropathy, unspecified: Secondary | ICD-10-CM | POA: Diagnosis not present

## 2022-02-19 DIAGNOSIS — Z7901 Long term (current) use of anticoagulants: Secondary | ICD-10-CM | POA: Diagnosis not present

## 2022-02-19 DIAGNOSIS — G4733 Obstructive sleep apnea (adult) (pediatric): Secondary | ICD-10-CM | POA: Diagnosis not present

## 2022-02-19 DIAGNOSIS — Z79891 Long term (current) use of opiate analgesic: Secondary | ICD-10-CM | POA: Diagnosis not present

## 2022-02-19 DIAGNOSIS — M1711 Unilateral primary osteoarthritis, right knee: Secondary | ICD-10-CM | POA: Diagnosis not present

## 2022-02-20 DIAGNOSIS — Z9181 History of falling: Secondary | ICD-10-CM | POA: Diagnosis not present

## 2022-02-20 DIAGNOSIS — Z7901 Long term (current) use of anticoagulants: Secondary | ICD-10-CM | POA: Diagnosis not present

## 2022-02-20 DIAGNOSIS — K219 Gastro-esophageal reflux disease without esophagitis: Secondary | ICD-10-CM | POA: Diagnosis not present

## 2022-02-20 DIAGNOSIS — I480 Paroxysmal atrial fibrillation: Secondary | ICD-10-CM | POA: Diagnosis not present

## 2022-02-20 DIAGNOSIS — M1711 Unilateral primary osteoarthritis, right knee: Secondary | ICD-10-CM | POA: Diagnosis not present

## 2022-02-20 DIAGNOSIS — Z7985 Long-term (current) use of injectable non-insulin antidiabetic drugs: Secondary | ICD-10-CM | POA: Diagnosis not present

## 2022-02-20 DIAGNOSIS — I110 Hypertensive heart disease with heart failure: Secondary | ICD-10-CM | POA: Diagnosis not present

## 2022-02-20 DIAGNOSIS — J9690 Respiratory failure, unspecified, unspecified whether with hypoxia or hypercapnia: Secondary | ICD-10-CM | POA: Diagnosis not present

## 2022-02-20 DIAGNOSIS — I5032 Chronic diastolic (congestive) heart failure: Secondary | ICD-10-CM | POA: Diagnosis not present

## 2022-02-20 DIAGNOSIS — D630 Anemia in neoplastic disease: Secondary | ICD-10-CM | POA: Diagnosis not present

## 2022-02-20 DIAGNOSIS — G4733 Obstructive sleep apnea (adult) (pediatric): Secondary | ICD-10-CM | POA: Diagnosis not present

## 2022-02-20 DIAGNOSIS — Z79891 Long term (current) use of opiate analgesic: Secondary | ICD-10-CM | POA: Diagnosis not present

## 2022-02-20 DIAGNOSIS — E1140 Type 2 diabetes mellitus with diabetic neuropathy, unspecified: Secondary | ICD-10-CM | POA: Diagnosis not present

## 2022-02-20 DIAGNOSIS — Z87891 Personal history of nicotine dependence: Secondary | ICD-10-CM | POA: Diagnosis not present

## 2022-02-20 DIAGNOSIS — Z7951 Long term (current) use of inhaled steroids: Secondary | ICD-10-CM | POA: Diagnosis not present

## 2022-02-21 DIAGNOSIS — Z7985 Long-term (current) use of injectable non-insulin antidiabetic drugs: Secondary | ICD-10-CM | POA: Diagnosis not present

## 2022-02-21 DIAGNOSIS — D630 Anemia in neoplastic disease: Secondary | ICD-10-CM | POA: Diagnosis not present

## 2022-02-21 DIAGNOSIS — J9690 Respiratory failure, unspecified, unspecified whether with hypoxia or hypercapnia: Secondary | ICD-10-CM | POA: Diagnosis not present

## 2022-02-21 DIAGNOSIS — M1711 Unilateral primary osteoarthritis, right knee: Secondary | ICD-10-CM | POA: Diagnosis not present

## 2022-02-21 DIAGNOSIS — I110 Hypertensive heart disease with heart failure: Secondary | ICD-10-CM | POA: Diagnosis not present

## 2022-02-21 DIAGNOSIS — Z7901 Long term (current) use of anticoagulants: Secondary | ICD-10-CM | POA: Diagnosis not present

## 2022-02-21 DIAGNOSIS — K219 Gastro-esophageal reflux disease without esophagitis: Secondary | ICD-10-CM | POA: Diagnosis not present

## 2022-02-21 DIAGNOSIS — Z7951 Long term (current) use of inhaled steroids: Secondary | ICD-10-CM | POA: Diagnosis not present

## 2022-02-21 DIAGNOSIS — Z87891 Personal history of nicotine dependence: Secondary | ICD-10-CM | POA: Diagnosis not present

## 2022-02-21 DIAGNOSIS — Z79891 Long term (current) use of opiate analgesic: Secondary | ICD-10-CM | POA: Diagnosis not present

## 2022-02-21 DIAGNOSIS — I480 Paroxysmal atrial fibrillation: Secondary | ICD-10-CM | POA: Diagnosis not present

## 2022-02-21 DIAGNOSIS — G4733 Obstructive sleep apnea (adult) (pediatric): Secondary | ICD-10-CM | POA: Diagnosis not present

## 2022-02-21 DIAGNOSIS — Z9181 History of falling: Secondary | ICD-10-CM | POA: Diagnosis not present

## 2022-02-21 DIAGNOSIS — E1140 Type 2 diabetes mellitus with diabetic neuropathy, unspecified: Secondary | ICD-10-CM | POA: Diagnosis not present

## 2022-02-21 DIAGNOSIS — I5032 Chronic diastolic (congestive) heart failure: Secondary | ICD-10-CM | POA: Diagnosis not present

## 2022-02-22 DIAGNOSIS — M6259 Muscle wasting and atrophy, not elsewhere classified, multiple sites: Secondary | ICD-10-CM | POA: Diagnosis not present

## 2022-02-22 DIAGNOSIS — U071 COVID-19: Secondary | ICD-10-CM | POA: Diagnosis not present

## 2022-02-22 DIAGNOSIS — M6281 Muscle weakness (generalized): Secondary | ICD-10-CM | POA: Diagnosis not present

## 2022-02-22 DIAGNOSIS — R262 Difficulty in walking, not elsewhere classified: Secondary | ICD-10-CM | POA: Diagnosis not present

## 2022-02-23 DIAGNOSIS — R262 Difficulty in walking, not elsewhere classified: Secondary | ICD-10-CM | POA: Diagnosis not present

## 2022-02-23 DIAGNOSIS — M6259 Muscle wasting and atrophy, not elsewhere classified, multiple sites: Secondary | ICD-10-CM | POA: Diagnosis not present

## 2022-02-23 DIAGNOSIS — M6281 Muscle weakness (generalized): Secondary | ICD-10-CM | POA: Diagnosis not present

## 2022-02-24 DIAGNOSIS — E1140 Type 2 diabetes mellitus with diabetic neuropathy, unspecified: Secondary | ICD-10-CM | POA: Diagnosis not present

## 2022-02-24 DIAGNOSIS — D630 Anemia in neoplastic disease: Secondary | ICD-10-CM | POA: Diagnosis not present

## 2022-02-24 DIAGNOSIS — Z7901 Long term (current) use of anticoagulants: Secondary | ICD-10-CM | POA: Diagnosis not present

## 2022-02-24 DIAGNOSIS — G4733 Obstructive sleep apnea (adult) (pediatric): Secondary | ICD-10-CM | POA: Diagnosis not present

## 2022-02-24 DIAGNOSIS — Z9181 History of falling: Secondary | ICD-10-CM | POA: Diagnosis not present

## 2022-02-24 DIAGNOSIS — I480 Paroxysmal atrial fibrillation: Secondary | ICD-10-CM | POA: Diagnosis not present

## 2022-02-24 DIAGNOSIS — Z7985 Long-term (current) use of injectable non-insulin antidiabetic drugs: Secondary | ICD-10-CM | POA: Diagnosis not present

## 2022-02-24 DIAGNOSIS — J9690 Respiratory failure, unspecified, unspecified whether with hypoxia or hypercapnia: Secondary | ICD-10-CM | POA: Diagnosis not present

## 2022-02-24 DIAGNOSIS — Z79891 Long term (current) use of opiate analgesic: Secondary | ICD-10-CM | POA: Diagnosis not present

## 2022-02-24 DIAGNOSIS — K219 Gastro-esophageal reflux disease without esophagitis: Secondary | ICD-10-CM | POA: Diagnosis not present

## 2022-02-24 DIAGNOSIS — I110 Hypertensive heart disease with heart failure: Secondary | ICD-10-CM | POA: Diagnosis not present

## 2022-02-24 DIAGNOSIS — I5032 Chronic diastolic (congestive) heart failure: Secondary | ICD-10-CM | POA: Diagnosis not present

## 2022-02-24 DIAGNOSIS — M1711 Unilateral primary osteoarthritis, right knee: Secondary | ICD-10-CM | POA: Diagnosis not present

## 2022-02-24 DIAGNOSIS — Z87891 Personal history of nicotine dependence: Secondary | ICD-10-CM | POA: Diagnosis not present

## 2022-02-24 DIAGNOSIS — Z7951 Long term (current) use of inhaled steroids: Secondary | ICD-10-CM | POA: Diagnosis not present

## 2022-02-26 DIAGNOSIS — Z9181 History of falling: Secondary | ICD-10-CM | POA: Diagnosis not present

## 2022-02-26 DIAGNOSIS — D630 Anemia in neoplastic disease: Secondary | ICD-10-CM | POA: Diagnosis not present

## 2022-02-26 DIAGNOSIS — K219 Gastro-esophageal reflux disease without esophagitis: Secondary | ICD-10-CM | POA: Diagnosis not present

## 2022-02-26 DIAGNOSIS — M1711 Unilateral primary osteoarthritis, right knee: Secondary | ICD-10-CM | POA: Diagnosis not present

## 2022-02-26 DIAGNOSIS — Z87891 Personal history of nicotine dependence: Secondary | ICD-10-CM | POA: Diagnosis not present

## 2022-02-26 DIAGNOSIS — Z7901 Long term (current) use of anticoagulants: Secondary | ICD-10-CM | POA: Diagnosis not present

## 2022-02-26 DIAGNOSIS — J9690 Respiratory failure, unspecified, unspecified whether with hypoxia or hypercapnia: Secondary | ICD-10-CM | POA: Diagnosis not present

## 2022-02-26 DIAGNOSIS — I480 Paroxysmal atrial fibrillation: Secondary | ICD-10-CM | POA: Diagnosis not present

## 2022-02-26 DIAGNOSIS — Z7951 Long term (current) use of inhaled steroids: Secondary | ICD-10-CM | POA: Diagnosis not present

## 2022-02-26 DIAGNOSIS — G4733 Obstructive sleep apnea (adult) (pediatric): Secondary | ICD-10-CM | POA: Diagnosis not present

## 2022-02-26 DIAGNOSIS — E1140 Type 2 diabetes mellitus with diabetic neuropathy, unspecified: Secondary | ICD-10-CM | POA: Diagnosis not present

## 2022-02-26 DIAGNOSIS — I110 Hypertensive heart disease with heart failure: Secondary | ICD-10-CM | POA: Diagnosis not present

## 2022-02-26 DIAGNOSIS — Z7985 Long-term (current) use of injectable non-insulin antidiabetic drugs: Secondary | ICD-10-CM | POA: Diagnosis not present

## 2022-02-26 DIAGNOSIS — I5032 Chronic diastolic (congestive) heart failure: Secondary | ICD-10-CM | POA: Diagnosis not present

## 2022-02-26 DIAGNOSIS — Z79891 Long term (current) use of opiate analgesic: Secondary | ICD-10-CM | POA: Diagnosis not present

## 2022-03-03 ENCOUNTER — Other Ambulatory Visit: Payer: Self-pay | Admitting: Interventional Radiology

## 2022-03-03 DIAGNOSIS — C220 Liver cell carcinoma: Secondary | ICD-10-CM

## 2022-03-03 IMAGING — MR MR ABDOMEN WO/W CM
20 series · 48 of 48 positions shown · IV contrast (gadavist)
Comparison: Multiple priors, most recent MRI abdomen dated
February 22, 2021

CLINICAL DATA: Follow-up HCC

EXAM:
MRI ABDOMEN WITHOUT AND WITH CONTRAST
TECHNIQUE: Multiplanar multisequence MR imaging of the abdomen was performed
both before and after the administration of intravenous contrast.
CONTRAST:  10mL GADAVIST GADOBUTROL 1 MMOL/ML IV SOLN

[Series 3: cor haste · coronal · 6.0mm · 1.31mm/px · 1 of 47 slices shown]
[im 1/47]
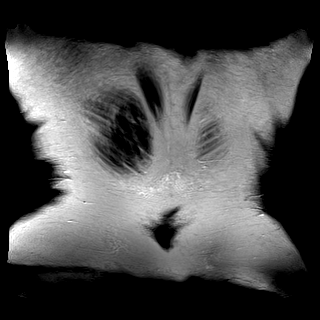

[Series 4: ax haste · axial · 6.0mm · 1.38mm/px · 1 of 38 slices shown]
[im 1/38]
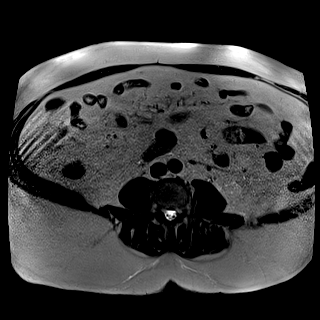

[Series 7: T2 fat-sat · axial · 6.0mm · 1.38mm/px · 1 of 36 slices shown]
[im 1/36]
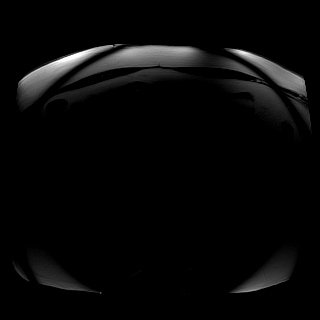

[Series 8: t1_vibe_opp-in_tra_p4_bh · axial · 3.0mm · 1.38mm/px · z∈[-212,+49]mm · 2 of 88 slices shown (1 of 4)]
[im 1/88]
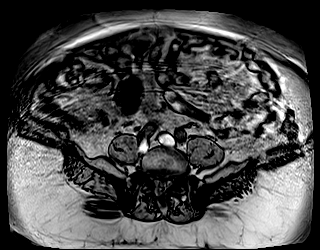
[im 88/88]
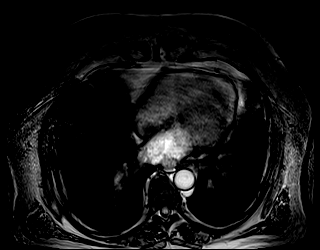

[Series 8: t1_vibe_opp-in_tra_p4_bh · axial · 3.0mm · 1.38mm/px · z∈[-212,+49]mm · 2 of 88 slices shown (2 of 4)]
[im 1/88]
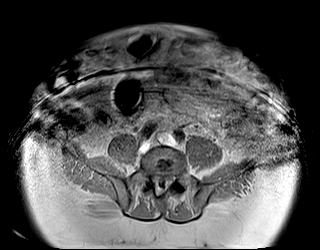
[im 88/88]
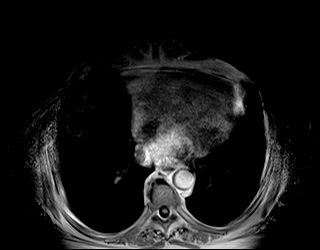

[Series 9: DWI · axial · 6.0mm · 1.64mm/px · z∈[-178,+103]mm · 3 of 120 slices shown (1 of 2)]
[im 1/120]
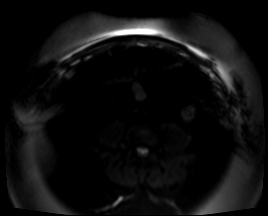
[im 60/120]
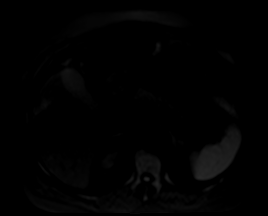
[im 120/120]
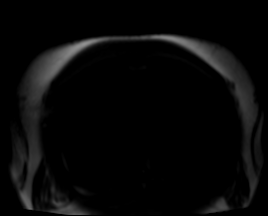

[Series 10: DWI · axial · 6.0mm · 1.64mm/px · 1 of 40 slices shown (2 of 2)]
[im 1/40]
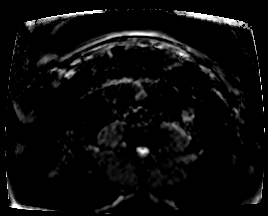

[Series 11: bSSFP · axial · 6.0mm · 0.86mm/px · 1 of 36 slices shown]
[im 1/36]
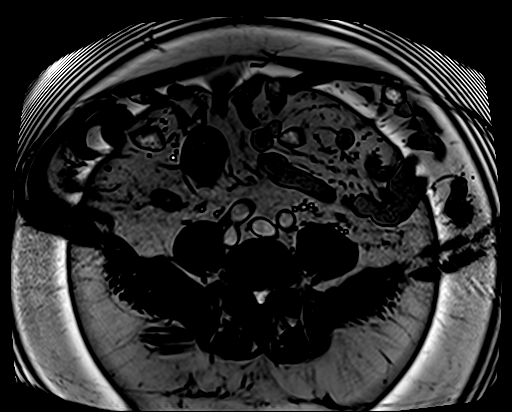

[Series 12: t1_vibe_opp-in_tra_p4_bh · axial · 3.0mm · 1.38mm/px · z∈[-197,+64]mm · 3 of 88 slices shown (3 of 4)]
[im 1/88]
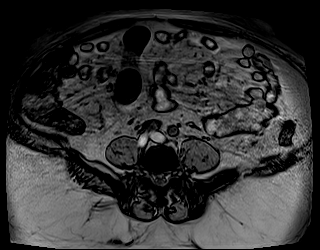
[im 44/88]
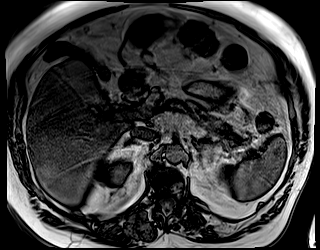
[im 88/88]
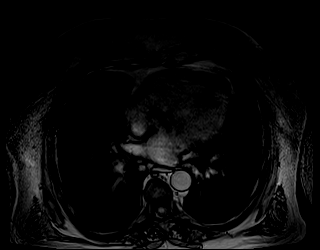

[Series 12: t1_vibe_opp-in_tra_p4_bh · axial · 3.0mm · 1.38mm/px · z∈[-197,+64]mm · 3 of 88 slices shown (4 of 4)]
[im 1/88]
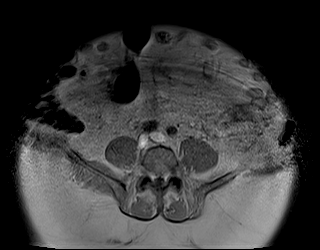
[im 44/88]
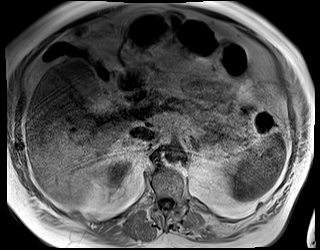
[im 88/88]
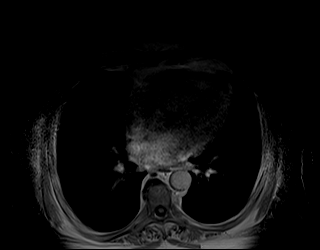

[Series 13: t1_vibe_fs_tra_p4_bh_pre · axial · 3.0mm · 1.38mm/px · z∈[-207,+54]mm · 3 of 88 slices shown]
[im 1/88]
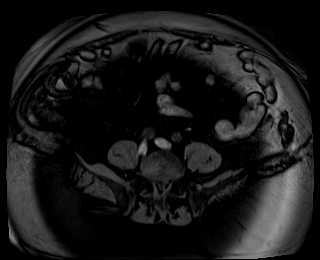
[im 44/88]
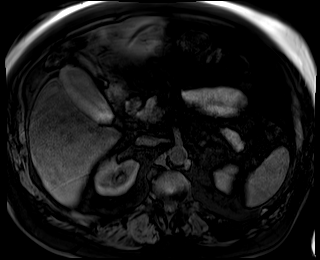
[im 88/88]
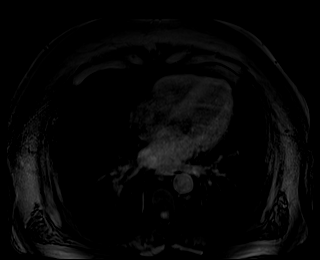

[Series 15: t1_vibe_fs_tra_p4_bh_post · axial · 3.0mm · 1.38mm/px · z∈[-207,+54]mm · 3 of 88 slices shown (1 of 4)]
[im 1/88]
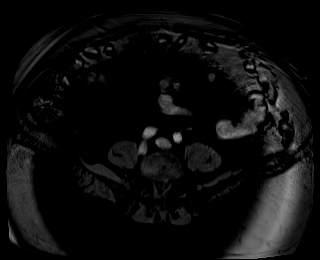
[im 44/88]
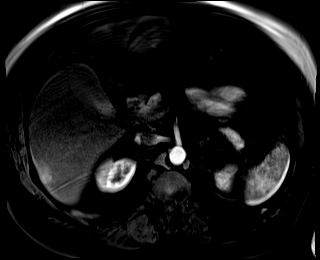
[im 88/88]
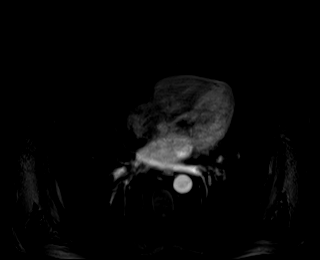

[Series 16: t1_vibe_fs_tra_p4_bh_post_sub · axial · 3.0mm · 1.38mm/px · z∈[-207,+54]mm · 3 of 88 slices shown (1 of 4)]
[im 1/88]
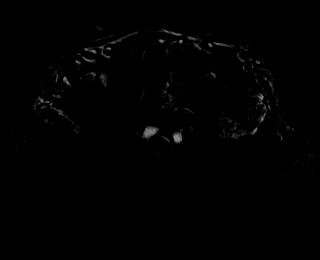
[im 44/88]
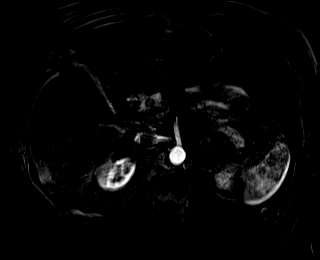
[im 88/88]
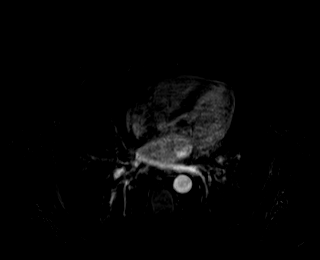

[Series 17: t1_vibe_fs_tra_p4_bh_post · axial · 3.0mm · 1.38mm/px · z∈[-207,+54]mm · 3 of 88 slices shown (2 of 4)]
[im 1/88]
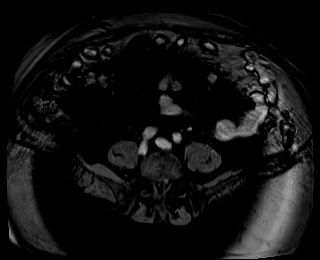
[im 44/88]
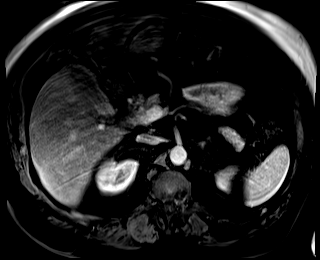
[im 88/88]
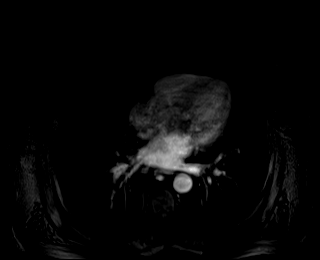

[Series 18: t1_vibe_fs_tra_p4_bh_post_sub · axial · 3.0mm · 1.38mm/px · z∈[-207,+54]mm · 3 of 88 slices shown (2 of 4)]
[im 1/88]
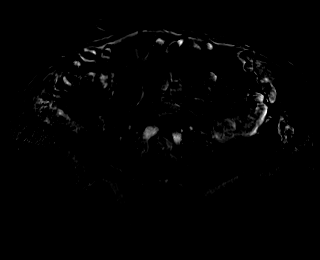
[im 44/88]
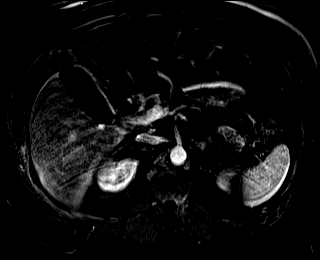
[im 88/88]
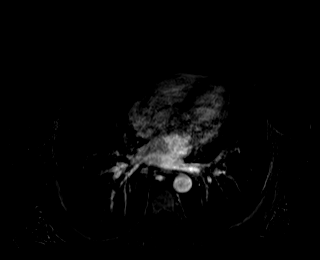

[Series 19: t1_vibe_fs_tra_p4_bh_post · axial · 3.0mm · 1.38mm/px · z∈[-207,+54]mm · 3 of 88 slices shown (3 of 4)]
[im 1/88]
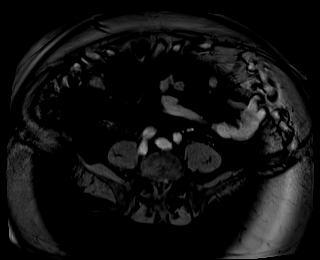
[im 44/88]
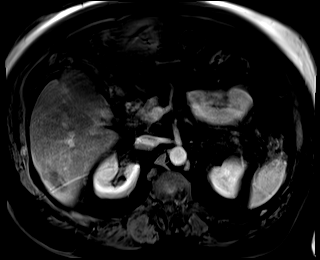
[im 88/88]
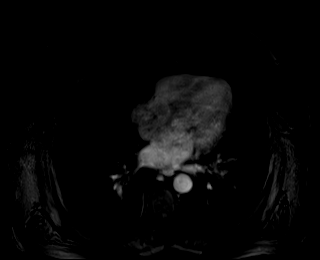

[Series 20: t1_vibe_fs_tra_p4_bh_post_sub · axial · 3.0mm · 1.38mm/px · z∈[-207,+54]mm · 3 of 88 slices shown (3 of 4)]
[im 1/88]
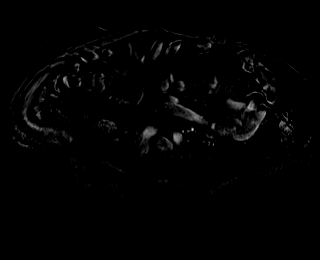
[im 44/88]
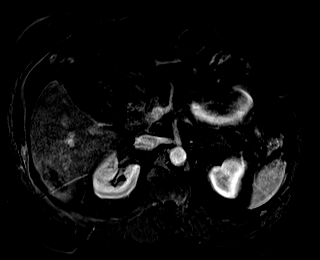
[im 88/88]
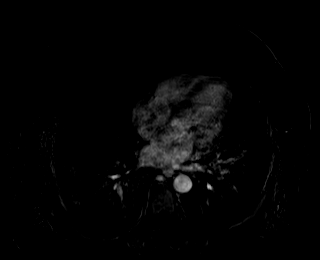

[Series 21: t1_vibe_fs_tra_p4_bh_post · axial · 3.0mm · 1.38mm/px · z∈[-207,+54]mm · 3 of 88 slices shown (4 of 4)]
[im 1/88]
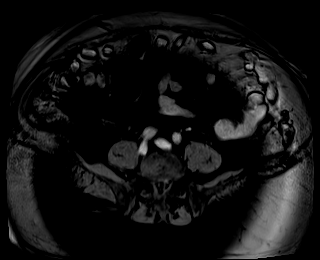
[im 44/88]
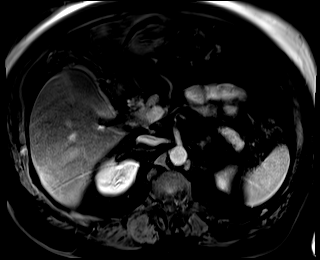
[im 88/88]
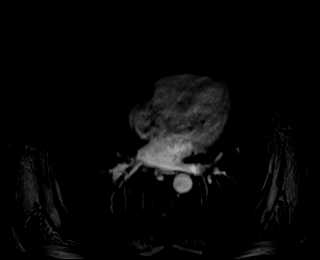

[Series 22: t1_vibe_fs_tra_p4_bh_post_sub · axial · 3.0mm · 1.38mm/px · z∈[-207,+54]mm · 3 of 88 slices shown (4 of 4)]
[im 1/88]
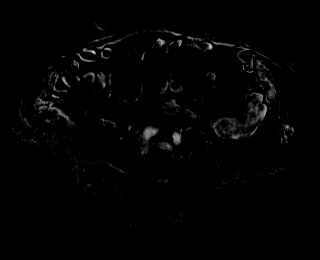
[im 44/88]
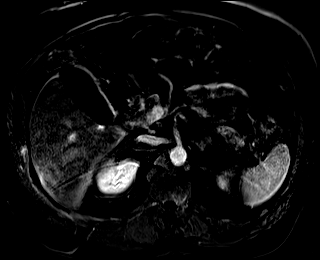
[im 88/88]
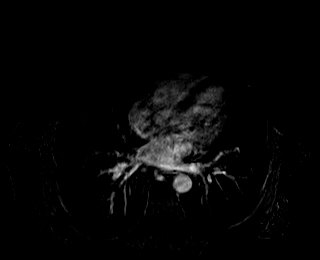

[Series 23: T1 dynamic post-contrast · coronal · 3.0mm · 1.41mm/px · 3 of 96 slices shown]
[im 1/96]
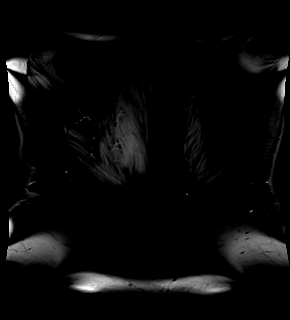
[im 48/96]
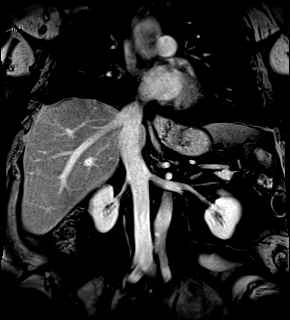
[im 96/96]
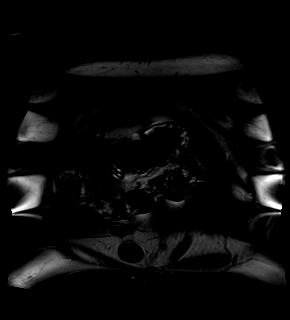

[48 of 48 positions shown; findings below may reference images not displayed]

FINDINGS: Lower chest: No acute findings.

Hepatobiliary: Signal dropout on out of phase imaging, compatible
with hepatic steatosis. Ovoid area of hypoenhancement seen in
segment 6 of the liver with some heterogeneous intrinsic T1
hyperintensity. Images are somewhat motion degraded, nodular area of
enhancement along the posterior margin is persistent but somewhat
less prominent when compared with prior exam, measures approximately
17 x 9 mm on series 2, image 49, ablation site measures
approximately 4.0 x 2.0 cm on series 14, image 49.

Pancreas: No mass, inflammatory changes, or other parenchymal
abnormality identified.

Spleen:  Within normal limits in size and appearance.

Adrenals/Urinary Tract: Stable right adrenal nodule measuring
cm. Left adrenal gland is unremarkable. Simple cyst of the left
kidney. No hydronephrosis.

Stomach/Bowel: Small hiatal hernia. Visualized bowel is
unremarkable.

Vascular/Lymphatic: No pathologically enlarged lymph nodes
identified. No abdominal aortic aneurysm demonstrated.

Other: Enhancing soft tissue nodule of the right posterior chest
wall measuring 1.0 cm on series 15, image 47, has been slowly
increasing in size when compared with multiple prior exams.

Musculoskeletal: No suspicious bone lesions identified.
IMPRESSION: 1. Treated lesion of segment 6 of the liver with unchanged size of
the ablation cavity. Images are somewhat motion degraded on today's
exam, nodular area of enhancement along the posterior margin is
persistent but less prominent when compared with prior, remains
suspicious for viable disease. LR TR viable.
2. Enhancing soft tissue nodule of the right posterior chest wall
which has been slowly increasing in size when compared with multiple
prior exams. Recommend ultrasound and tissue sampling for further
evaluation.
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
## 2022-03-11 ENCOUNTER — Encounter: Payer: Self-pay | Admitting: Interventional Cardiology

## 2022-03-11 ENCOUNTER — Ambulatory Visit: Payer: Medicare Other | Attending: Interventional Cardiology | Admitting: Interventional Cardiology

## 2022-03-11 VITALS — BP 118/60 | HR 82 | Ht 71.0 in | Wt 275.0 lb

## 2022-03-11 DIAGNOSIS — I480 Paroxysmal atrial fibrillation: Secondary | ICD-10-CM

## 2022-03-11 DIAGNOSIS — G4733 Obstructive sleep apnea (adult) (pediatric): Secondary | ICD-10-CM

## 2022-03-11 DIAGNOSIS — I110 Hypertensive heart disease with heart failure: Secondary | ICD-10-CM

## 2022-03-11 DIAGNOSIS — I5032 Chronic diastolic (congestive) heart failure: Secondary | ICD-10-CM

## 2022-03-11 NOTE — Patient Instructions (Signed)
Medication Instructions:  Your physician recommends that you continue on your current medications as directed. Please refer to the Current Medication list given to you today.  *If you need a refill on your cardiac medications before your next appointment, please call your pharmacy*   Lab Work: none If you have labs (blood work) drawn today and your tests are completely normal, you will receive your results only by: MyChart Message (if you have MyChart) OR A paper copy in the mail If you have any lab test that is abnormal or we need to change your treatment, we will call you to review the results.   Testing/Procedures: none   Follow-Up: At [facility], you and your health needs are our priority.  As part of our continuing mission to provide you with exceptional heart care, we have created designated Provider Care Teams.  These Care Teams include your primary Cardiologist (physician) and Advanced Practice Providers (APPs -  Physician Assistants and Nurse Practitioners) who all work together to provide you with the care you need, when you need it.  We recommend signing up for the patient portal called "MyChart".  Sign up information is provided on this After Visit Summary.  MyChart is used to connect with patients for Virtual Visits (Telemedicine).  Patients are able to view lab/test results, encounter notes, upcoming appointments, etc.  Non-urgent messages can be sent to your provider as well.   To learn more about what you can do with MyChart, go to [email or web address].    Your next appointment:   12 month(s)  The format for your next appointment:   In Person  Provider:   Jayadeep Varanasi, MD     Other Instructions    Important Information About Sugar       

## 2022-03-17 ENCOUNTER — Ambulatory Visit: Payer: Medicare Other | Admitting: Interventional Cardiology

## 2022-03-18 DIAGNOSIS — Z23 Encounter for immunization: Secondary | ICD-10-CM | POA: Diagnosis not present

## 2022-03-18 DIAGNOSIS — I482 Chronic atrial fibrillation, unspecified: Secondary | ICD-10-CM | POA: Diagnosis not present

## 2022-03-18 DIAGNOSIS — I700 Atherosclerosis of aorta: Secondary | ICD-10-CM | POA: Diagnosis not present

## 2022-03-18 DIAGNOSIS — I714 Abdominal aortic aneurysm, without rupture, unspecified: Secondary | ICD-10-CM | POA: Diagnosis not present

## 2022-03-18 DIAGNOSIS — Z0000 Encounter for general adult medical examination without abnormal findings: Secondary | ICD-10-CM | POA: Diagnosis not present

## 2022-03-18 DIAGNOSIS — G4700 Insomnia, unspecified: Secondary | ICD-10-CM | POA: Diagnosis not present

## 2022-03-18 DIAGNOSIS — E782 Mixed hyperlipidemia: Secondary | ICD-10-CM | POA: Diagnosis not present

## 2022-03-18 DIAGNOSIS — D649 Anemia, unspecified: Secondary | ICD-10-CM | POA: Diagnosis not present

## 2022-03-18 DIAGNOSIS — D6869 Other thrombophilia: Secondary | ICD-10-CM | POA: Diagnosis not present

## 2022-03-18 DIAGNOSIS — K7460 Unspecified cirrhosis of liver: Secondary | ICD-10-CM | POA: Diagnosis not present

## 2022-03-18 DIAGNOSIS — C220 Liver cell carcinoma: Secondary | ICD-10-CM | POA: Diagnosis not present

## 2022-03-18 DIAGNOSIS — E1140 Type 2 diabetes mellitus with diabetic neuropathy, unspecified: Secondary | ICD-10-CM | POA: Diagnosis not present

## 2022-03-24 DIAGNOSIS — M6259 Muscle wasting and atrophy, not elsewhere classified, multiple sites: Secondary | ICD-10-CM | POA: Diagnosis not present

## 2022-03-24 DIAGNOSIS — R262 Difficulty in walking, not elsewhere classified: Secondary | ICD-10-CM | POA: Diagnosis not present

## 2022-03-24 DIAGNOSIS — M6281 Muscle weakness (generalized): Secondary | ICD-10-CM | POA: Diagnosis not present

## 2022-03-25 DIAGNOSIS — M6259 Muscle wasting and atrophy, not elsewhere classified, multiple sites: Secondary | ICD-10-CM | POA: Diagnosis not present

## 2022-03-25 DIAGNOSIS — U071 COVID-19: Secondary | ICD-10-CM | POA: Diagnosis not present

## 2022-03-25 DIAGNOSIS — R262 Difficulty in walking, not elsewhere classified: Secondary | ICD-10-CM | POA: Diagnosis not present

## 2022-03-25 DIAGNOSIS — M6281 Muscle weakness (generalized): Secondary | ICD-10-CM | POA: Diagnosis not present

## 2022-03-26 DIAGNOSIS — M6281 Muscle weakness (generalized): Secondary | ICD-10-CM | POA: Diagnosis not present

## 2022-03-26 DIAGNOSIS — M6259 Muscle wasting and atrophy, not elsewhere classified, multiple sites: Secondary | ICD-10-CM | POA: Diagnosis not present

## 2022-03-26 DIAGNOSIS — R262 Difficulty in walking, not elsewhere classified: Secondary | ICD-10-CM | POA: Diagnosis not present

## 2022-03-28 DIAGNOSIS — G4733 Obstructive sleep apnea (adult) (pediatric): Secondary | ICD-10-CM | POA: Diagnosis not present

## 2022-04-01 DIAGNOSIS — E1140 Type 2 diabetes mellitus with diabetic neuropathy, unspecified: Secondary | ICD-10-CM | POA: Diagnosis not present

## 2022-04-08 DIAGNOSIS — M25562 Pain in left knee: Secondary | ICD-10-CM | POA: Diagnosis not present

## 2022-04-08 DIAGNOSIS — G8929 Other chronic pain: Secondary | ICD-10-CM | POA: Diagnosis not present

## 2022-04-08 DIAGNOSIS — M25561 Pain in right knee: Secondary | ICD-10-CM | POA: Diagnosis not present

## 2022-04-15 ENCOUNTER — Ambulatory Visit (HOSPITAL_COMMUNITY)
Admission: RE | Admit: 2022-04-15 | Discharge: 2022-04-15 | Disposition: A | Discharge status: Home / Self Care | Payer: Medicare Other | Source: Ambulatory Visit | Attending: Interventional Radiology | Admitting: Interventional Radiology

## 2022-04-15 DIAGNOSIS — K8689 Other specified diseases of pancreas: Secondary | ICD-10-CM | POA: Diagnosis not present

## 2022-04-15 DIAGNOSIS — C220 Liver cell carcinoma: Secondary | ICD-10-CM | POA: Insufficient documentation

## 2022-04-15 DIAGNOSIS — Z8505 Personal history of malignant neoplasm of liver: Secondary | ICD-10-CM | POA: Diagnosis not present

## 2022-04-15 DIAGNOSIS — K449 Diaphragmatic hernia without obstruction or gangrene: Secondary | ICD-10-CM | POA: Diagnosis not present

## 2022-04-15 DIAGNOSIS — K760 Fatty (change of) liver, not elsewhere classified: Secondary | ICD-10-CM | POA: Diagnosis not present

## 2022-04-15 MED ORDER — GADOBUTROL 1 MMOL/ML IV SOLN
10.0000 mL | Freq: Once | INTRAVENOUS | Status: AC | PRN
  Administered 2022-04-15: 10 mL via INTRAVENOUS

## 2022-04-24 DIAGNOSIS — M6281 Muscle weakness (generalized): Secondary | ICD-10-CM | POA: Diagnosis not present

## 2022-04-24 DIAGNOSIS — M6259 Muscle wasting and atrophy, not elsewhere classified, multiple sites: Secondary | ICD-10-CM | POA: Diagnosis not present

## 2022-04-24 DIAGNOSIS — R262 Difficulty in walking, not elsewhere classified: Secondary | ICD-10-CM | POA: Diagnosis not present

## 2022-04-25 DIAGNOSIS — E113293 Type 2 diabetes mellitus with mild nonproliferative diabetic retinopathy without macular edema, bilateral: Secondary | ICD-10-CM | POA: Diagnosis not present

## 2022-04-25 DIAGNOSIS — R262 Difficulty in walking, not elsewhere classified: Secondary | ICD-10-CM | POA: Diagnosis not present

## 2022-04-25 DIAGNOSIS — H52201 Unspecified astigmatism, right eye: Secondary | ICD-10-CM | POA: Diagnosis not present

## 2022-04-25 DIAGNOSIS — M6281 Muscle weakness (generalized): Secondary | ICD-10-CM | POA: Diagnosis not present

## 2022-04-25 DIAGNOSIS — M6259 Muscle wasting and atrophy, not elsewhere classified, multiple sites: Secondary | ICD-10-CM | POA: Diagnosis not present

## 2022-05-01 DIAGNOSIS — E1140 Type 2 diabetes mellitus with diabetic neuropathy, unspecified: Secondary | ICD-10-CM | POA: Diagnosis not present

## 2022-05-07 ENCOUNTER — Ambulatory Visit
Admission: RE | Admit: 2022-05-07 | Discharge: 2022-05-07 | Disposition: A | Discharge status: Home / Self Care | Payer: Medicare Other | Source: Ambulatory Visit | Attending: Interventional Radiology | Admitting: Interventional Radiology

## 2022-05-07 DIAGNOSIS — C220 Liver cell carcinoma: Secondary | ICD-10-CM

## 2022-05-07 DIAGNOSIS — Z9889 Other specified postprocedural states: Secondary | ICD-10-CM | POA: Diagnosis not present

## 2022-05-07 NOTE — Progress Notes (Signed)
Patient ID: Steve Jensen, male   DOB: 06/26/1940, 81 y.o.   MRN: [id number]        Chief Complaint: [facility], post bland embolization on 02/15/2018 followed by hepatic microwave ablation on 11/24/2018   Referring Physician(s): Burr Medico (Oncology) [name] (PCP)  History of Present Illness: Steve Jensen is a 81 y.o. male with past medical history significant for type 2 diabetes, hypertension, hyperlipidemia, morbid obesity obstructive sleep apnea and prostate cancer who is seen in consultation via telemedicine following the acquisition of a postprocedural abdominal MRI performed 12/24/2019, following technically successful hepatic microwave ablation performed 11/24/2018 and transcatheter bland embolization performed 02/15/2018 for biopsy-proven hepatocellular carcinoma.  He is seen today in telemedicine consultation following acquisition of surveillance abdominal MRI performed 04/15/2022.  He reports discomfort involving the proximal thigh and has an appointment with Dr. Brigitte Pulse for early next year.  He is otherwise without complaint.  Specifically, no abdominal pain, fever or chills.  No increased abdominal girth.  No yellowing of the skin or eyes.  No change in appetite or energy level.     Past Medical History:  Diagnosis Date   Anemia    Diabetes mellitus type II, controlled ([place])    with neuropathy   Dysrhythmia    A-Fib. cardioversion done   GERD (gastroesophageal reflux disease)    Hyperlipidemia    Hypertension    Morbid obesity ([place])    Neuromuscular disorder ([place])    feet   OSA (obstructive sleep apnea)    On BiPAP at 15/11cm H2O   Prostate cancer ([place])    Shingles    on face Nov 2010   Urinary incontinence     Past Surgical History:  Procedure Laterality Date   CARDIOVERSION N/A 09/06/2013   Procedure: CARDIOVERSION;  Surgeon: [name], MD;  Location: [facility] ENDOSCOPY;  Service: Cardiovascular;  Laterality: N/A;   EYE SURGERY     cataract surgery bilat; surgery  to correct droopy eyelids    FLEXIBLE SIGMOIDOSCOPY N/A 12/24/2021   Procedure: FLEXIBLE SIGMOIDOSCOPY;  Surgeon: Harvel Quale, MD;  Location: [facility] ENDO SUITE;  Service: Gastroenterology;  Laterality: N/A;   IR ANGIOGRAM SELECTIVE EACH ADDITIONAL VESSEL  02/15/2018   IR ANGIOGRAM SELECTIVE EACH ADDITIONAL VESSEL  02/15/2018   IR ANGIOGRAM SELECTIVE EACH ADDITIONAL VESSEL  02/15/2018   IR ANGIOGRAM SELECTIVE EACH ADDITIONAL VESSEL  02/15/2018   IR ANGIOGRAM SELECTIVE EACH ADDITIONAL VESSEL  02/15/2018   IR ANGIOGRAM VISCERAL SELECTIVE  02/15/2018   IR ANGIOGRAM VISCERAL SELECTIVE  02/15/2018   IR EMBO TUMOR ORGAN ISCHEMIA INFARCT INC GUIDE ROADMAPPING  02/15/2018   IR RADIOLOGIST EVAL & MGMT  01/26/2018   IR RADIOLOGIST EVAL & MGMT  03/17/2018   IR RADIOLOGIST EVAL & MGMT  06/29/2018   IR RADIOLOGIST EVAL & MGMT  07/27/2018   IR RADIOLOGIST EVAL & MGMT  10/28/2018   IR RADIOLOGIST EVAL & MGMT  12/14/2018   IR RADIOLOGIST EVAL & MGMT  03/17/2019   IR RADIOLOGIST EVAL & MGMT  07/14/2019   IR RADIOLOGIST EVAL & MGMT  12/27/2019   IR RADIOLOGIST EVAL & MGMT  08/21/2020   IR RADIOLOGIST EVAL & MGMT  02/28/2021   IR RADIOLOGIST EVAL & MGMT  06/25/2021   IR RADIOLOGIST EVAL & MGMT  10/18/2021   IR US GUIDE VASC ACCESS RIGHT  02/15/2018   knee surgery bilat      PROSTATE SURGERY     RADIOLOGY WITH ANESTHESIA N/A 11/24/2018   Procedure: MICROWAVE THERMAL ABLATION LIVER;  Surgeon: Sandi Mariscal, MD;  Location: [facility] ORS;  Service: Anesthesiology;  Laterality: N/A;   right shoulder rotator cuff surgery     TONSILLECTOMY      Allergies: Lipitor [atorvastatin], Simvastatin, Zithromax [azithromycin], and Amlodipine  Medications: Prior to Admission medications   Medication Sig Start Date End Date Taking? Authorizing Provider  acetaminophen (TYLENOL) 500 MG tablet Take 2 tablets (1,000 mg total) by mouth every 8 (eight) hours as needed for mild pain or headache (or Fever >/= 101). 12/27/21   Barton Dubois, MD   allopurinol (ZYLOPRIM) 100 MG tablet Take 100 mg by mouth daily. 04/17/21   [provider]  apixaban (ELIQUIS) 5 MG TABS tablet TAKE 1 TABLET(5 MG) BY MOUTH TWICE DAILY 11/15/18   [name], MD  bisacodyl (DULCOLAX) 10 MG suppository Place 1 suppository (10 mg total) rectally daily as needed for moderate constipation. 12/27/21   Barton Dubois, MD  diclofenac Sodium (VOLTAREN) 1 % GEL Apply 4 g topically 4 (four) times daily. 12/27/21   Barton Dubois, MD  diltiazem (CARDIZEM CD) 300 MG 24 hr capsule Take 1 capsule (300 mg total) by mouth daily. 02/17/22 02/18/23  Marylu Lund., NP  fish oil-omega-3 fatty acids 1000 MG capsule Take 2 g by mouth 2 (two) times daily.     [provider]  furosemide (LASIX) 40 MG tablet Take 1 tablet (40 mg total) by mouth daily as needed for fluid or edema. Patient taking differently: Take 40 mg by mouth in the morning and at bedtime. 12/27/21   Barton Dubois, MD  loratadine (CLARITIN) 10 MG tablet Take 10 mg by mouth daily as needed for allergies.     [provider]  losartan (COZAAR) 25 MG tablet Take 0.5 tablets (12.5 mg total) by mouth daily. 12/27/21 12/27/22  Barton Dubois, MD  metFORMIN (GLUCOPHAGE) 1000 MG tablet Take 1,000 mg by mouth 2 (two) times daily. 06/27/14   [provider]  metoprolol succinate (TOPROL-XL) 50 MG 24 hr tablet Take 50 mg by mouth daily.  08/10/13   [provider]  MOUNJARO 12.5 MG/0.5ML Pen Inject 12.5 mg into the skin once a week. 01/22/22   [provider]  Multiple Vitamins-Minerals (MULTIVITAMIN WITH MINERALS) tablet Take 1 tablet by mouth daily.    [provider]  omeprazole (PRILOSEC) 20 MG capsule Take 2 capsules (40 mg total) by mouth 2 (two) times daily before a meal. 12/27/21   Barton Dubois, MD  polyethylene glycol (MIRALAX / GLYCOLAX) 17 g packet Take 17 g by mouth 3 (three) times daily. Titrate down to PRN base on further resolution of his ileus. 12/27/21    Barton Dubois, MD  potassium chloride SA (KLOR-CON M) 20 MEQ tablet TAKE 1 TABLET BY MOUTH  DAILY AS NEEDED WHILE  TAKING FUROSEMIDE Patient taking differently: Take 20 mEq by mouth daily. 06/28/21   [name], MD  pregabalin (LYRICA) 100 MG capsule Take 100 mg by mouth 2 (two) times a day.  09/14/18   [provider]  traMADol (ULTRAM) 50 MG tablet Take 1 tablet (50 mg total) by mouth every 8 (eight) hours as needed for severe pain. 12/27/21   Barton Dubois, MD  ZETIA 10 MG tablet Take 10 mg by mouth daily.  01/17/13   [provider]     Family History  Problem Relation Age of Onset   Hypertension Brother    Diabetes Brother    Cancer Brother        bladder  cancer   Diabetes Brother    CVA Brother    Hypertension Brother    Prostate cancer Brother    Diabetes Brother    Hypertension Brother    Heart disease Brother        CABG   Heart attack Brother    Stroke Brother     Social History   Socioeconomic History   Marital status: Widowed    Spouse name: Not on file   Number of children: Not on file   Years of education: Not on file   Highest education level: Not on file  Occupational History   Not on file  Tobacco Use   Smoking status: Former    Packs/day: 1.00    Years: 15.00    Total pack years: 15.00    Types: Cigarettes    Quit date: 05/27/1975    Years since quitting: 46.9   Smokeless tobacco: Never  Vaping Use   Vaping Use: Never used  Substance and Sexual Activity   Alcohol use: No   Drug use: No   Sexual activity: Not on file  Other Topics Concern   Not on file  Social History Narrative   Not on file   Social Determinants of Health   Financial Resource Strain: Not on file  Food Insecurity: Not on file  Transportation Needs: Not on file  Physical Activity: Not on file  Stress: Not on file  Social Connections: Not on file    ECOG Status: 0 - Asymptomatic  Review of Systems  Review of Systems: A 12 point ROS discussed  and pertinent positives are indicated in the HPI above.  All other systems are negative.   Physical Exam No direct physical exam was performed (except for noted visual exam findings with Video Visits).   Vital Signs: There were no vitals taken for this visit.  Imaging: MR ABDOMEN WWO CONTRAST  Result Date: 04/17/2022 CLINICAL DATA:  History of hepatocellular carcinoma. EXAM: MRI ABDOMEN WITHOUT AND WITH CONTRAST TECHNIQUE: Multiplanar multisequence MR imaging of the abdomen was performed both before and after the administration of intravenous contrast. CONTRAST:  53m GADAVIST GADOBUTROL 1 MMOL/ML IV SOLN COMPARISON:  December 23, 2021. FINDINGS: Lower chest: Incidental imaging of the lung bases is unremarkable without effusion or consolidative changes. Limited assessment on MRI. Hepatobiliary: Moderate hepatic steatosis with moderate hepatomegaly at 20 cm greatest craniocaudal span. Ablation defect in hepatic subsegment VI shows lower signal on previous imaging and is better defined at 4.3 x 2.1 cm. No gross evidence of recurrent tumor though motion artifact limits assessment. No new visible hepatic lesions to the extent evaluated on this motion limited assessment Lobular hepatic contours as on previous imaging. Moderate hepatic steatosis. Sludge fills the gallbladder. No pericholecystic stranding. No biliary duct dilation. Pancreas: Mild pancreatic atrophy. Calcifications in the head pancreas better seen on recent CT imaging. No signs of pancreatic inflammation. Spleen:  Normal size and contour.  No gross lesion. Adrenals/Urinary Tract: Stable appearance of a RIGHT adrenal adenoma 2.7 cm. LEFT adrenal is normal. Renal cortical scarring of the bilateral kidneys. No hydronephrosis. No perinephric stranding. Limited assessment of the kidneys due to motion artifact as outlined above. Stomach/Bowel: Small hiatal hernia. No acute gastrointestinal findings to the extent evaluated on this abdominal MRI.  Vascular/Lymphatic: No pathologically enlarged lymph nodes identified. No abdominal aortic aneurysm demonstrated. Other:  No ascites Musculoskeletal: Signs of healed rib fractures about the bilateral chest similar to previous CT imaging. IMPRESSION: 1. Ablation defect in hepatic subsegment VI  shows lower signal on previous imaging and is better defined at 4.3 x 2.1 cm. No gross evidence of recurrent tumor though motion artifact limits assessment. Continued surveillance is suggested. Size is similar to the January 25th study accounting for motion on both exams and is smaller than in March of 2022 for comparison. Categorized at this time as LR TR nonviable with the caveat that the exam is motion limited. 2. No new visible hepatic lesions to the extent evaluated on this motion limited assessment. 3. Moderate hepatic steatosis with moderate hepatomegaly. 4. Sludge fills the gallbladder. No pericholecystic stranding. 5. Stable appearance of a RIGHT adrenal adenoma. 6. Small hiatal hernia. Electronically Signed   By: Zetta Bills M.D.   On: 04/17/2022 11:30    Labs:  CBC: Recent Labs    12/22/21 0550 12/23/21 0604 12/24/21 0537 12/25/21 0535  WBC 16.9* 16.8* 13.5* 12.5*  HGB 7.7* 8.2* 8.0* 8.3*  HCT 25.8* 28.1* 27.7* 29.2*  PLT 755* 662* 638* 652*    COAGS: No results for input(s): "INR", "APTT" in the last 8760 hours.  BMP: Recent Labs    12/23/21 0604 12/24/21 0537 12/25/21 0535 12/26/21 0447  NA 139 139 143 141  K 3.5 3.5 4.0 3.7  CL 114* 115* 115* 114*  CO2 20* 19* 22 21*  GLUCOSE 153* 112* 124* 133*  BUN 28* '20 15 11  '$ CALCIUM 8.6* 8.4* 8.6* 8.7*  CREATININE 1.08 0.95 0.87 0.90  GFRNONAA >60 >60 >60 >60    LIVER FUNCTION TESTS: Recent Labs    06/11/21 1635 10/09/21 1732 12/21/21 0543  BILITOT 0.5 0.4  --   AST 30 16  --   ALT 34 23  --   PROT 6.6 6.3  --   ALBUMIN  --   --  2.3*    TUMOR MARKERS: Recent Labs    06/11/21 1635 10/09/21 1732  AFPTM 1.5 1.7     Assessment and Plan:  Steve Jensen is a 81 y.o. male with past medical history significant for type 2 diabetes, hypertension, hyperlipidemia, morbid obesity obstructive sleep apnea and prostate cancer who is seen in consultation via telemedicine following the acquisition of a postprocedural abdominal MRI performed 12/24/2019, following technically successful hepatic microwave ablation performed 11/24/2018 and transcatheter bland embolization performed 02/15/2018 for biopsy-proven hepatocellular carcinoma.  He is seen today in telemedicine consultation following acquisition of surveillance abdominal MRI performed 04/15/2022.  Personal review of abdominal MRI performed 04/17/2022 demonstrates a technically excellent result without evidence of residual or recurrent disease.  Additionally and potentially more importantly, there are no new sites of abnormal enhancement to suggest a new area of hepatocellular carcinoma.  Previously noted approximately 1.1 cm enhancing nodule along the right lateral flank has decreased in size in the interval, currently measures 0.7 cm (image 101, series 10904), thus benign etiology, potentially nodular scar tissue as a result of prior hepatic ablation.  Note, AFP levels were not elevated prior to the intervention in 2019 (1.2) and most recently were not found to be elevated on most recent AFP level obtained 10/09/2021 (1.7).  Given above, I gave the patient a choice of either obtaining a follow-up abdominal MRI in 1 year versus foregoing continued dedicated surveillance.  The patient would like to have a yearly abdominal MRI as given his history of NASH, he is at risk for development of additional hepatocellular carcinoma.  I think this is very reasonable.  The patient knows to call the interventional radiology clinic with any interval questions or  concerns  Plan: - Follow-up telemedicine consultation in 1 year (December 2024) following acquisition of abdominal MRI.     Thank you for this interesting consult.  I greatly enjoyed meeting Steve Jensen and look forward to participating in their care.  A copy of this report was sent to the requesting provider on this date.  Electronically Signed: Sandi Mariscal 05/07/2022, 4:35 PM   I spent a total of 15 Minutes in remote  clinical consultation, greater than 50% of which was counseling/coordinating care for post [facility] embolization and ablation.    Visit type: Audio only (telephone). Audio (no video) only due to patient's lack of internet/smartphone capability. Alternative for in-person consultation at [facility], [address] [place], [place], [place]. This visit type was conducted due to national recommendations for restrictions regarding the COVID-19 Pandemic (e.g. social distancing).  This format is felt to be most appropriate for this patient at this time.  All issues noted in this document were discussed and addressed.

## 2022-05-22 DIAGNOSIS — I10 Essential (primary) hypertension: Secondary | ICD-10-CM | POA: Diagnosis not present

## 2022-05-22 DIAGNOSIS — G4700 Insomnia, unspecified: Secondary | ICD-10-CM | POA: Diagnosis not present

## 2022-05-22 DIAGNOSIS — M1990 Unspecified osteoarthritis, unspecified site: Secondary | ICD-10-CM | POA: Diagnosis not present

## 2022-05-22 DIAGNOSIS — E1140 Type 2 diabetes mellitus with diabetic neuropathy, unspecified: Secondary | ICD-10-CM | POA: Diagnosis not present

## 2022-05-22 DIAGNOSIS — E782 Mixed hyperlipidemia: Secondary | ICD-10-CM | POA: Diagnosis not present

## 2022-05-24 DIAGNOSIS — M6259 Muscle wasting and atrophy, not elsewhere classified, multiple sites: Secondary | ICD-10-CM | POA: Diagnosis not present

## 2022-05-24 DIAGNOSIS — R262 Difficulty in walking, not elsewhere classified: Secondary | ICD-10-CM | POA: Diagnosis not present

## 2022-05-24 DIAGNOSIS — M6281 Muscle weakness (generalized): Secondary | ICD-10-CM | POA: Diagnosis not present

## 2022-05-26 DIAGNOSIS — M6281 Muscle weakness (generalized): Secondary | ICD-10-CM | POA: Diagnosis not present

## 2022-05-26 DIAGNOSIS — M6259 Muscle wasting and atrophy, not elsewhere classified, multiple sites: Secondary | ICD-10-CM | POA: Diagnosis not present

## 2022-05-26 DIAGNOSIS — R262 Difficulty in walking, not elsewhere classified: Secondary | ICD-10-CM | POA: Diagnosis not present

## 2022-06-01 DIAGNOSIS — E1140 Type 2 diabetes mellitus with diabetic neuropathy, unspecified: Secondary | ICD-10-CM | POA: Diagnosis not present

## 2022-06-04 DIAGNOSIS — M1990 Unspecified osteoarthritis, unspecified site: Secondary | ICD-10-CM | POA: Diagnosis not present

## 2022-06-04 DIAGNOSIS — I10 Essential (primary) hypertension: Secondary | ICD-10-CM | POA: Diagnosis not present

## 2022-06-04 DIAGNOSIS — E782 Mixed hyperlipidemia: Secondary | ICD-10-CM | POA: Diagnosis not present

## 2022-06-04 DIAGNOSIS — E1140 Type 2 diabetes mellitus with diabetic neuropathy, unspecified: Secondary | ICD-10-CM | POA: Diagnosis not present

## 2022-06-04 DIAGNOSIS — M629 Disorder of muscle, unspecified: Secondary | ICD-10-CM | POA: Diagnosis not present

## 2022-06-04 DIAGNOSIS — D649 Anemia, unspecified: Secondary | ICD-10-CM | POA: Diagnosis not present

## 2022-06-04 DIAGNOSIS — I482 Chronic atrial fibrillation, unspecified: Secondary | ICD-10-CM | POA: Diagnosis not present

## 2022-06-04 DIAGNOSIS — K7460 Unspecified cirrhosis of liver: Secondary | ICD-10-CM | POA: Diagnosis not present

## 2022-06-24 DIAGNOSIS — M6259 Muscle wasting and atrophy, not elsewhere classified, multiple sites: Secondary | ICD-10-CM | POA: Diagnosis not present

## 2022-06-24 DIAGNOSIS — R262 Difficulty in walking, not elsewhere classified: Secondary | ICD-10-CM | POA: Diagnosis not present

## 2022-06-24 DIAGNOSIS — M6281 Muscle weakness (generalized): Secondary | ICD-10-CM | POA: Diagnosis not present

## 2022-06-26 DIAGNOSIS — M6281 Muscle weakness (generalized): Secondary | ICD-10-CM | POA: Diagnosis not present

## 2022-06-26 DIAGNOSIS — R262 Difficulty in walking, not elsewhere classified: Secondary | ICD-10-CM | POA: Diagnosis not present

## 2022-06-26 DIAGNOSIS — M6259 Muscle wasting and atrophy, not elsewhere classified, multiple sites: Secondary | ICD-10-CM | POA: Diagnosis not present

## 2022-07-02 DIAGNOSIS — E1140 Type 2 diabetes mellitus with diabetic neuropathy, unspecified: Secondary | ICD-10-CM | POA: Diagnosis not present

## 2022-07-16 DIAGNOSIS — M170 Bilateral primary osteoarthritis of knee: Secondary | ICD-10-CM | POA: Diagnosis not present

## 2022-07-24 DIAGNOSIS — M6281 Muscle weakness (generalized): Secondary | ICD-10-CM | POA: Diagnosis not present

## 2022-07-24 DIAGNOSIS — M6259 Muscle wasting and atrophy, not elsewhere classified, multiple sites: Secondary | ICD-10-CM | POA: Diagnosis not present

## 2022-07-24 DIAGNOSIS — R262 Difficulty in walking, not elsewhere classified: Secondary | ICD-10-CM | POA: Diagnosis not present

## 2022-07-25 DIAGNOSIS — M6281 Muscle weakness (generalized): Secondary | ICD-10-CM | POA: Diagnosis not present

## 2022-07-25 DIAGNOSIS — R262 Difficulty in walking, not elsewhere classified: Secondary | ICD-10-CM | POA: Diagnosis not present

## 2022-07-25 DIAGNOSIS — M6259 Muscle wasting and atrophy, not elsewhere classified, multiple sites: Secondary | ICD-10-CM | POA: Diagnosis not present

## 2022-07-31 DIAGNOSIS — E1140 Type 2 diabetes mellitus with diabetic neuropathy, unspecified: Secondary | ICD-10-CM | POA: Diagnosis not present

## 2022-08-23 DIAGNOSIS — M6259 Muscle wasting and atrophy, not elsewhere classified, multiple sites: Secondary | ICD-10-CM | POA: Diagnosis not present

## 2022-08-23 DIAGNOSIS — R262 Difficulty in walking, not elsewhere classified: Secondary | ICD-10-CM | POA: Diagnosis not present

## 2022-08-23 DIAGNOSIS — M6281 Muscle weakness (generalized): Secondary | ICD-10-CM | POA: Diagnosis not present

## 2022-08-25 DIAGNOSIS — M6259 Muscle wasting and atrophy, not elsewhere classified, multiple sites: Secondary | ICD-10-CM | POA: Diagnosis not present

## 2022-08-25 DIAGNOSIS — R262 Difficulty in walking, not elsewhere classified: Secondary | ICD-10-CM | POA: Diagnosis not present

## 2022-08-25 DIAGNOSIS — M6281 Muscle weakness (generalized): Secondary | ICD-10-CM | POA: Diagnosis not present

## 2022-08-30 DIAGNOSIS — E1140 Type 2 diabetes mellitus with diabetic neuropathy, unspecified: Secondary | ICD-10-CM | POA: Diagnosis not present

## 2022-08-31 DIAGNOSIS — E1140 Type 2 diabetes mellitus with diabetic neuropathy, unspecified: Secondary | ICD-10-CM | POA: Diagnosis not present

## 2022-09-23 DIAGNOSIS — M6259 Muscle wasting and atrophy, not elsewhere classified, multiple sites: Secondary | ICD-10-CM | POA: Diagnosis not present

## 2022-09-23 DIAGNOSIS — M6281 Muscle weakness (generalized): Secondary | ICD-10-CM | POA: Diagnosis not present

## 2022-09-23 DIAGNOSIS — R262 Difficulty in walking, not elsewhere classified: Secondary | ICD-10-CM | POA: Diagnosis not present

## 2022-09-24 DIAGNOSIS — M6281 Muscle weakness (generalized): Secondary | ICD-10-CM | POA: Diagnosis not present

## 2022-09-24 DIAGNOSIS — M6259 Muscle wasting and atrophy, not elsewhere classified, multiple sites: Secondary | ICD-10-CM | POA: Diagnosis not present

## 2022-09-24 DIAGNOSIS — R262 Difficulty in walking, not elsewhere classified: Secondary | ICD-10-CM | POA: Diagnosis not present

## 2022-09-29 DIAGNOSIS — E1140 Type 2 diabetes mellitus with diabetic neuropathy, unspecified: Secondary | ICD-10-CM | POA: Diagnosis not present

## 2022-10-08 DIAGNOSIS — I10 Essential (primary) hypertension: Secondary | ICD-10-CM | POA: Diagnosis not present

## 2022-10-08 DIAGNOSIS — Z794 Long term (current) use of insulin: Secondary | ICD-10-CM | POA: Diagnosis not present

## 2022-10-08 DIAGNOSIS — E782 Mixed hyperlipidemia: Secondary | ICD-10-CM | POA: Diagnosis not present

## 2022-10-08 DIAGNOSIS — E1140 Type 2 diabetes mellitus with diabetic neuropathy, unspecified: Secondary | ICD-10-CM | POA: Diagnosis not present

## 2022-10-08 DIAGNOSIS — K7460 Unspecified cirrhosis of liver: Secondary | ICD-10-CM | POA: Diagnosis not present

## 2022-10-08 DIAGNOSIS — D649 Anemia, unspecified: Secondary | ICD-10-CM | POA: Diagnosis not present

## 2022-10-15 DIAGNOSIS — M170 Bilateral primary osteoarthritis of knee: Secondary | ICD-10-CM | POA: Diagnosis not present

## 2022-10-23 DIAGNOSIS — R262 Difficulty in walking, not elsewhere classified: Secondary | ICD-10-CM | POA: Diagnosis not present

## 2022-10-23 DIAGNOSIS — M6259 Muscle wasting and atrophy, not elsewhere classified, multiple sites: Secondary | ICD-10-CM | POA: Diagnosis not present

## 2022-10-23 DIAGNOSIS — M6281 Muscle weakness (generalized): Secondary | ICD-10-CM | POA: Diagnosis not present

## 2022-10-25 DIAGNOSIS — M6281 Muscle weakness (generalized): Secondary | ICD-10-CM | POA: Diagnosis not present

## 2022-10-25 DIAGNOSIS — R262 Difficulty in walking, not elsewhere classified: Secondary | ICD-10-CM | POA: Diagnosis not present

## 2022-10-25 DIAGNOSIS — M6259 Muscle wasting and atrophy, not elsewhere classified, multiple sites: Secondary | ICD-10-CM | POA: Diagnosis not present

## 2022-10-29 DIAGNOSIS — E1140 Type 2 diabetes mellitus with diabetic neuropathy, unspecified: Secondary | ICD-10-CM | POA: Diagnosis not present

## 2022-11-06 DIAGNOSIS — G4733 Obstructive sleep apnea (adult) (pediatric): Secondary | ICD-10-CM | POA: Diagnosis not present

## 2022-11-11 ENCOUNTER — Other Ambulatory Visit: Payer: Self-pay

## 2022-11-11 ENCOUNTER — Emergency Department (HOSPITAL_COMMUNITY): Payer: Medicare Other

## 2022-11-11 ENCOUNTER — Inpatient Hospital Stay (HOSPITAL_COMMUNITY)
Admission: EM | Admit: 2022-11-11 | Discharge: 2022-11-15 | DRG: 291 | Disposition: A | Discharge status: Skilled Nursing Facility | Payer: Medicare Other | Attending: Family Medicine | Admitting: Family Medicine

## 2022-11-11 DIAGNOSIS — R062 Wheezing: Secondary | ICD-10-CM | POA: Diagnosis not present

## 2022-11-11 DIAGNOSIS — I499 Cardiac arrhythmia, unspecified: Secondary | ICD-10-CM | POA: Diagnosis not present

## 2022-11-11 DIAGNOSIS — M6281 Muscle weakness (generalized): Secondary | ICD-10-CM | POA: Diagnosis not present

## 2022-11-11 DIAGNOSIS — Z8042 Family history of malignant neoplasm of prostate: Secondary | ICD-10-CM | POA: Diagnosis not present

## 2022-11-11 DIAGNOSIS — Z66 Do not resuscitate: Secondary | ICD-10-CM | POA: Diagnosis not present

## 2022-11-11 DIAGNOSIS — I480 Paroxysmal atrial fibrillation: Secondary | ICD-10-CM | POA: Diagnosis present

## 2022-11-11 DIAGNOSIS — Z888 Allergy status to other drugs, medicaments and biological substances status: Secondary | ICD-10-CM | POA: Diagnosis not present

## 2022-11-11 DIAGNOSIS — T502X5A Adverse effect of carbonic-anhydrase inhibitors, benzothiadiazides and other diuretics, initial encounter: Secondary | ICD-10-CM | POA: Diagnosis present

## 2022-11-11 DIAGNOSIS — I5033 Acute on chronic diastolic (congestive) heart failure: Secondary | ICD-10-CM | POA: Diagnosis present

## 2022-11-11 DIAGNOSIS — Z79899 Other long term (current) drug therapy: Secondary | ICD-10-CM

## 2022-11-11 DIAGNOSIS — I110 Hypertensive heart disease with heart failure: Secondary | ICD-10-CM | POA: Diagnosis not present

## 2022-11-11 DIAGNOSIS — Z823 Family history of stroke: Secondary | ICD-10-CM | POA: Diagnosis not present

## 2022-11-11 DIAGNOSIS — G4733 Obstructive sleep apnea (adult) (pediatric): Secondary | ICD-10-CM | POA: Diagnosis not present

## 2022-11-11 DIAGNOSIS — R2681 Unsteadiness on feet: Secondary | ICD-10-CM | POA: Diagnosis not present

## 2022-11-11 DIAGNOSIS — I5032 Chronic diastolic (congestive) heart failure: Secondary | ICD-10-CM | POA: Diagnosis not present

## 2022-11-11 DIAGNOSIS — Z743 Need for continuous supervision: Secondary | ICD-10-CM | POA: Diagnosis not present

## 2022-11-11 DIAGNOSIS — E1342 Other specified diabetes mellitus with diabetic polyneuropathy: Secondary | ICD-10-CM

## 2022-11-11 DIAGNOSIS — Z833 Family history of diabetes mellitus: Secondary | ICD-10-CM | POA: Diagnosis not present

## 2022-11-11 DIAGNOSIS — Z8052 Family history of malignant neoplasm of bladder: Secondary | ICD-10-CM | POA: Diagnosis not present

## 2022-11-11 DIAGNOSIS — E876 Hypokalemia: Secondary | ICD-10-CM | POA: Diagnosis present

## 2022-11-11 DIAGNOSIS — R410 Disorientation, unspecified: Secondary | ICD-10-CM | POA: Diagnosis not present

## 2022-11-11 DIAGNOSIS — Z8249 Family history of ischemic heart disease and other diseases of the circulatory system: Secondary | ICD-10-CM

## 2022-11-11 DIAGNOSIS — C220 Liver cell carcinoma: Secondary | ICD-10-CM | POA: Diagnosis not present

## 2022-11-11 DIAGNOSIS — Z7984 Long term (current) use of oral hypoglycemic drugs: Secondary | ICD-10-CM

## 2022-11-11 DIAGNOSIS — J811 Chronic pulmonary edema: Secondary | ICD-10-CM | POA: Diagnosis not present

## 2022-11-11 DIAGNOSIS — E1165 Type 2 diabetes mellitus with hyperglycemia: Secondary | ICD-10-CM | POA: Diagnosis present

## 2022-11-11 DIAGNOSIS — I5023 Acute on chronic systolic (congestive) heart failure: Secondary | ICD-10-CM | POA: Diagnosis not present

## 2022-11-11 DIAGNOSIS — E1142 Type 2 diabetes mellitus with diabetic polyneuropathy: Secondary | ICD-10-CM | POA: Diagnosis not present

## 2022-11-11 DIAGNOSIS — E785 Hyperlipidemia, unspecified: Secondary | ICD-10-CM | POA: Diagnosis present

## 2022-11-11 DIAGNOSIS — Z8505 Personal history of malignant neoplasm of liver: Secondary | ICD-10-CM

## 2022-11-11 DIAGNOSIS — E1140 Type 2 diabetes mellitus with diabetic neuropathy, unspecified: Secondary | ICD-10-CM | POA: Diagnosis not present

## 2022-11-11 DIAGNOSIS — R1314 Dysphagia, pharyngoesophageal phase: Secondary | ICD-10-CM | POA: Diagnosis not present

## 2022-11-11 DIAGNOSIS — K219 Gastro-esophageal reflux disease without esophagitis: Secondary | ICD-10-CM | POA: Diagnosis not present

## 2022-11-11 DIAGNOSIS — Z7901 Long term (current) use of anticoagulants: Secondary | ICD-10-CM

## 2022-11-11 DIAGNOSIS — Z881 Allergy status to other antibiotic agents status: Secondary | ICD-10-CM | POA: Diagnosis not present

## 2022-11-11 DIAGNOSIS — I4891 Unspecified atrial fibrillation: Secondary | ICD-10-CM | POA: Diagnosis present

## 2022-11-11 DIAGNOSIS — G709 Myoneural disorder, unspecified: Secondary | ICD-10-CM | POA: Diagnosis not present

## 2022-11-11 DIAGNOSIS — Z7985 Long-term (current) use of injectable non-insulin antidiabetic drugs: Secondary | ICD-10-CM

## 2022-11-11 DIAGNOSIS — Z8546 Personal history of malignant neoplasm of prostate: Secondary | ICD-10-CM

## 2022-11-11 DIAGNOSIS — R29898 Other symptoms and signs involving the musculoskeletal system: Secondary | ICD-10-CM | POA: Diagnosis not present

## 2022-11-11 DIAGNOSIS — E119 Type 2 diabetes mellitus without complications: Secondary | ICD-10-CM

## 2022-11-11 DIAGNOSIS — I509 Heart failure, unspecified: Principal | ICD-10-CM

## 2022-11-11 DIAGNOSIS — R0602 Shortness of breath: Secondary | ICD-10-CM | POA: Diagnosis not present

## 2022-11-11 DIAGNOSIS — I10 Essential (primary) hypertension: Secondary | ICD-10-CM | POA: Diagnosis not present

## 2022-11-11 DIAGNOSIS — I6381 Other cerebral infarction due to occlusion or stenosis of small artery: Secondary | ICD-10-CM | POA: Diagnosis not present

## 2022-11-11 DIAGNOSIS — R531 Weakness: Secondary | ICD-10-CM | POA: Diagnosis not present

## 2022-11-11 DIAGNOSIS — Z87891 Personal history of nicotine dependence: Secondary | ICD-10-CM | POA: Diagnosis not present

## 2022-11-11 DIAGNOSIS — R000 Tachycardia, unspecified: Secondary | ICD-10-CM | POA: Diagnosis not present

## 2022-11-11 DIAGNOSIS — D649 Anemia, unspecified: Secondary | ICD-10-CM | POA: Diagnosis not present

## 2022-11-11 DIAGNOSIS — G319 Degenerative disease of nervous system, unspecified: Secondary | ICD-10-CM | POA: Diagnosis not present

## 2022-11-11 DIAGNOSIS — G629 Polyneuropathy, unspecified: Secondary | ICD-10-CM | POA: Diagnosis not present

## 2022-11-11 LAB — MAGNESIUM: Magnesium: 1.7 mg/dL (ref 1.7–2.4)

## 2022-11-11 LAB — CBC WITH DIFFERENTIAL/PLATELET
Abs Immature Granulocytes: 0.15 10*3/uL — ABNORMAL HIGH (ref 0.00–0.07)
Basophils Absolute: 0.1 10*3/uL (ref 0.0–0.1)
Basophils Relative: 1 %
Eosinophils Absolute: 0 10*3/uL (ref 0.0–0.5)
Eosinophils Relative: 0 %
HCT: 40.2 % (ref 39.0–52.0)
Hemoglobin: 13 g/dL (ref 13.0–17.0)
Immature Granulocytes: 2 %
Lymphocytes Relative: 12 %
Lymphs Abs: 0.9 10*3/uL (ref 0.7–4.0)
MCH: 26.4 pg (ref 26.0–34.0)
MCHC: 32.3 g/dL (ref 30.0–36.0)
MCV: 81.7 fL (ref 80.0–100.0)
Monocytes Absolute: 1.5 10*3/uL — ABNORMAL HIGH (ref 0.1–1.0)
Monocytes Relative: 20 %
Neutro Abs: 4.7 10*3/uL (ref 1.7–7.7)
Neutrophils Relative %: 65 %
Platelets: 272 10*3/uL (ref 150–400)
RBC: 4.92 MIL/uL (ref 4.22–5.81)
RDW: 17.7 % — ABNORMAL HIGH (ref 11.5–15.5)
WBC: 7.3 10*3/uL (ref 4.0–10.5)
nRBC: 0 % (ref 0.0–0.2)

## 2022-11-11 LAB — COMPREHENSIVE METABOLIC PANEL
ALT: 24 U/L (ref 0–44)
AST: 21 U/L (ref 15–41)
Albumin: 3.4 g/dL — ABNORMAL LOW (ref 3.5–5.0)
Alkaline Phosphatase: 126 U/L (ref 38–126)
Anion gap: 10 (ref 5–15)
BUN: 14 mg/dL (ref 8–23)
CO2: 26 mmol/L (ref 22–32)
Calcium: 8.2 mg/dL — ABNORMAL LOW (ref 8.9–10.3)
Chloride: 101 mmol/L (ref 98–111)
Creatinine, Ser: 1.09 mg/dL (ref 0.61–1.24)
GFR, Estimated: >60 mL/min (ref >60)
Glucose, Bld: 159 mg/dL — ABNORMAL HIGH (ref 70–99)
Potassium: 3.1 mmol/L — ABNORMAL LOW (ref 3.5–5.1)
Sodium: 137 mmol/L (ref 135–145)
Total Bilirubin: 0.9 mg/dL (ref 0.3–1.2)
Total Protein: 6.2 g/dL — ABNORMAL LOW (ref 6.5–8.1)

## 2022-11-11 LAB — TROPONIN I (HIGH SENSITIVITY)
Troponin I (High Sensitivity): 9 ng/L (ref <18)
Troponin I (High Sensitivity): 10 ng/L (ref <18)

## 2022-11-11 LAB — LACTIC ACID, PLASMA
Lactic Acid, Venous: 1.6 mmol/L (ref 0.5–1.9)
Lactic Acid, Venous: 1.6 mmol/L (ref 0.5–1.9)

## 2022-11-11 LAB — GLUCOSE, CAPILLARY: Glucose-Capillary: 201 mg/dL — ABNORMAL HIGH (ref 70–99)

## 2022-11-11 LAB — BRAIN NATRIURETIC PEPTIDE: B Natriuretic Peptide: 457 pg/mL — ABNORMAL HIGH (ref 0.0–100.0)

## 2022-11-11 LAB — ETHANOL: Alcohol, Ethyl (B): <10 mg/dL (ref <10)

## 2022-11-11 MED ORDER — INSULIN ASPART 100 UNIT/ML IJ SOLN
0.0000 [IU] | Freq: Every day | INTRAMUSCULAR | Status: DC
  Administered 2022-11-11 – 2022-11-14 (×2): 2 [IU] via SUBCUTANEOUS

## 2022-11-11 MED ORDER — APIXABAN 5 MG PO TABS
5.0000 mg | ORAL_TABLET | Freq: Two times a day (BID) | ORAL | Status: DC
  Administered 2022-11-11 – 2022-11-15 (×8): 5 mg via ORAL
  Filled 2022-11-11 (×8): qty 1

## 2022-11-11 MED ORDER — FUROSEMIDE 10 MG/ML IJ SOLN
40.0000 mg | Freq: Two times a day (BID) | INTRAMUSCULAR | Status: DC
  Administered 2022-11-12 – 2022-11-13 (×3): 40 mg via INTRAVENOUS
  Filled 2022-11-11 (×3): qty 4

## 2022-11-11 MED ORDER — DILTIAZEM HCL ER COATED BEADS 180 MG PO CP24
300.0000 mg | ORAL_CAPSULE | Freq: Every day | ORAL | Status: DC
  Administered 2022-11-11 – 2022-11-12 (×2): 300 mg via ORAL
  Filled 2022-11-11 (×2): qty 1

## 2022-11-11 MED ORDER — POTASSIUM CHLORIDE CRYS ER 20 MEQ PO TBCR
40.0000 meq | EXTENDED_RELEASE_TABLET | Freq: Once | ORAL | Status: AC
  Administered 2022-11-11: 40 meq via ORAL
  Filled 2022-11-11: qty 2

## 2022-11-11 MED ORDER — IPRATROPIUM-ALBUTEROL 0.5-2.5 (3) MG/3ML IN SOLN: 3.0000 mL | RESPIRATORY_TRACT | Status: DC | PRN

## 2022-11-11 MED ORDER — MAGNESIUM SULFATE 2 GM/50ML IV SOLN
2.0000 g | Freq: Once | INTRAVENOUS | Status: AC
  Administered 2022-11-11: 2 g via INTRAVENOUS
  Filled 2022-11-11: qty 50

## 2022-11-11 MED ORDER — ACETAMINOPHEN 650 MG RE SUPP: 650.0000 mg | Freq: Four times a day (QID) | RECTAL | Status: DC | PRN

## 2022-11-11 MED ORDER — ONDANSETRON HCL 4 MG PO TABS: 4.0000 mg | ORAL_TABLET | Freq: Four times a day (QID) | ORAL | Status: DC | PRN

## 2022-11-11 MED ORDER — METOPROLOL SUCCINATE ER 50 MG PO TB24: 50.0000 mg | ORAL_TABLET | Freq: Every day | ORAL | Status: DC

## 2022-11-11 MED ORDER — LORAZEPAM 2 MG/ML IJ SOLN
0.5000 mg | Freq: Once | INTRAMUSCULAR | Status: AC
  Administered 2022-11-12: 0.5 mg via INTRAVENOUS
  Filled 2022-11-11: qty 1

## 2022-11-11 MED ORDER — INSULIN ASPART 100 UNIT/ML IJ SOLN
0.0000 [IU] | Freq: Three times a day (TID) | INTRAMUSCULAR | Status: DC
  Administered 2022-11-12: 1 [IU] via SUBCUTANEOUS
  Administered 2022-11-12 – 2022-11-13 (×2): 3 [IU] via SUBCUTANEOUS
  Administered 2022-11-13 (×2): 2 [IU] via SUBCUTANEOUS
  Administered 2022-11-14: 1 [IU] via SUBCUTANEOUS
  Administered 2022-11-14: 3 [IU] via SUBCUTANEOUS
  Administered 2022-11-14: 5 [IU] via SUBCUTANEOUS
  Administered 2022-11-15: 3 [IU] via SUBCUTANEOUS
  Administered 2022-11-15: 1 [IU] via SUBCUTANEOUS

## 2022-11-11 MED ORDER — DILTIAZEM HCL ER COATED BEADS 180 MG PO CP24: 300.0000 mg | ORAL_CAPSULE | Freq: Every day | ORAL | Status: DC

## 2022-11-11 MED ORDER — METOPROLOL SUCCINATE ER 50 MG PO TB24
50.0000 mg | ORAL_TABLET | Freq: Every day | ORAL | Status: DC
  Administered 2022-11-12 – 2022-11-13 (×2): 50 mg via ORAL
  Filled 2022-11-11 (×2): qty 1

## 2022-11-11 MED ORDER — POLYETHYLENE GLYCOL 3350 17 G PO PACK: 17.0000 g | PACK | Freq: Every day | ORAL | Status: DC | PRN

## 2022-11-11 MED ORDER — FUROSEMIDE 10 MG/ML IJ SOLN
60.0000 mg | Freq: Once | INTRAMUSCULAR | Status: AC
  Administered 2022-11-11: 60 mg via INTRAVENOUS
  Filled 2022-11-11: qty 6

## 2022-11-11 MED ORDER — POTASSIUM CHLORIDE 10 MEQ/100ML IV SOLN
10.0000 meq | INTRAVENOUS | Status: AC
  Administered 2022-11-11 (×2): 10 meq via INTRAVENOUS
  Filled 2022-11-11 (×2): qty 100

## 2022-11-11 MED ORDER — ONDANSETRON HCL 4 MG/2ML IJ SOLN: 4.0000 mg | Freq: Four times a day (QID) | INTRAMUSCULAR | Status: DC | PRN

## 2022-11-11 MED ORDER — ACETAMINOPHEN 325 MG PO TABS
650.0000 mg | ORAL_TABLET | Freq: Four times a day (QID) | ORAL | Status: DC | PRN
  Administered 2022-11-12 – 2022-11-14 (×3): 650 mg via ORAL
  Filled 2022-11-11 (×3): qty 2

## 2022-11-11 NOTE — ED Notes (Signed)
Gave report to [facility] LPN

## 2022-11-11 NOTE — Assessment & Plan Note (Addendum)
-   SSI- S -Hold home metformin - Hgba1c - 8.0% CBG (last 3)  Recent Labs    11/14/22 2209 11/15/22 0718 11/15/22 1108  GLUCAP 203* 137* 226*   Added prandial coverage 6/19, increased dose to 5 units TID with meals on 6/20 Continue glargine 15  units daily for basal coverage

## 2022-11-11 NOTE — H&P (Signed)
History and Physical    Steve Jensen ZOX:[id number] DOB: May 15, 1941 DOA: 11/11/2022  PCP: Lupita Raider, MD   Patient coming from: Home  I have personally briefly reviewed patient's old medical records in [facility]  Chief Complaint: Weakness  HPI: Steve Jensen is a 82 y.o. male with medical history significant for diastolic CHF, atrial fibrillation, hypertension, OSA, diabetes mellitus, liver cancer. Patient was brought to the ED with reports of confusion, generalized weakness, feeling poorly today.  Patient did not exactly fall today, he was sitting halfway off his bed and with family present trying to assist him, he slid down to the floor on to his buttock.  Patient's daughter-in-law is at bedside, she reports patient has intermittent episodes of confusion, which is normal for him.  His mental status is currently unchanged from his baseline.  On my evaluation he is not confused.  He has had bilateral lower extremity swelling.  Baseline over the past 2 weeks.  Reports difficulty breathing for the past 3 to 4 weeks, cough.  No chest pain.  He is on Lasix 40 mg daily and compliant.  Does not consistently check his weight.  ED Course: Tmax 99.  Heart rate 97-103.  Respiratory rate 16-25.  O2 sats 91 to 94% on room air.  Chest x-ray showing vascular congestion with mild pulm interstitial edema. BNP elevated at 457. Troponin 9 > 10. Lactic acid 1.6 Head CT without contrast negative for acute intracranial process. IV Lasix 60 mg x 1 given.  Hospitalist to admit for decompensated CHF.  Review of Systems: As per HPI all other systems reviewed and negative.  Past Medical History:  Diagnosis Date   Anemia    Diabetes mellitus type II, controlled (HCC)    with neuropathy   Dysrhythmia    A-Fib. cardioversion done   GERD (gastroesophageal reflux disease)    Hyperlipidemia    Hypertension    Morbid obesity (HCC)    Neuromuscular disorder (HCC)    feet   OSA (obstructive sleep  apnea)    On BiPAP at 15/11cm H2O   Prostate cancer (HCC)    Shingles    on face Nov 2010   Urinary incontinence     Past Surgical History:  Procedure Laterality Date   CARDIOVERSION N/A 09/06/2013   Procedure: CARDIOVERSION;  Surgeon: Corky Crafts, MD;  Location: [facility] ENDOSCOPY;  Service: Cardiovascular;  Laterality: N/A;   EYE SURGERY     cataract surgery bilat; surgery to correct droopy eyelids    FLEXIBLE SIGMOIDOSCOPY N/A 12/24/2021   Procedure: FLEXIBLE SIGMOIDOSCOPY;  Surgeon: Dolores Frame, MD;  Location: [facility] ENDO SUITE;  Service: Gastroenterology;  Laterality: N/A;   IR ANGIOGRAM SELECTIVE EACH ADDITIONAL VESSEL  02/15/2018   IR ANGIOGRAM SELECTIVE EACH ADDITIONAL VESSEL  02/15/2018   IR ANGIOGRAM SELECTIVE EACH ADDITIONAL VESSEL  02/15/2018   IR ANGIOGRAM SELECTIVE EACH ADDITIONAL VESSEL  02/15/2018   IR ANGIOGRAM SELECTIVE EACH ADDITIONAL VESSEL  02/15/2018   IR ANGIOGRAM VISCERAL SELECTIVE  02/15/2018   IR ANGIOGRAM VISCERAL SELECTIVE  02/15/2018   IR EMBO TUMOR ORGAN ISCHEMIA INFARCT INC GUIDE ROADMAPPING  02/15/2018   IR RADIOLOGIST EVAL & MGMT  01/26/2018   IR RADIOLOGIST EVAL & MGMT  03/17/2018   IR RADIOLOGIST EVAL & MGMT  06/29/2018   IR RADIOLOGIST EVAL & MGMT  07/27/2018   IR RADIOLOGIST EVAL & MGMT  10/28/2018   IR RADIOLOGIST EVAL & MGMT  12/14/2018   IR RADIOLOGIST EVAL & MGMT  03/17/2019   IR RADIOLOGIST EVAL & MGMT  07/14/2019   IR RADIOLOGIST EVAL & MGMT  12/27/2019   IR RADIOLOGIST EVAL & MGMT  08/21/2020   IR RADIOLOGIST EVAL & MGMT  02/28/2021   IR RADIOLOGIST EVAL & MGMT  06/25/2021   IR RADIOLOGIST EVAL & MGMT  10/18/2021   IR US GUIDE VASC ACCESS RIGHT  02/15/2018   knee surgery bilat      PROSTATE SURGERY     RADIOLOGY WITH ANESTHESIA N/A 11/24/2018   Procedure: MICROWAVE THERMAL ABLATION LIVER;  Surgeon: Simonne Come, MD;  Location: [facility] ORS;  Service: Anesthesiology;  Laterality: N/A;   right shoulder rotator cuff surgery     TONSILLECTOMY       reports  that he quit smoking about 47 years ago. His smoking use included cigarettes. He has a 15.00 pack-year smoking history. He has never used smokeless tobacco. He reports that he does not drink alcohol and does not use drugs.  Allergies  Allergen Reactions   Lipitor [Atorvastatin] Other (See Comments)    Muscle aches   Simvastatin Other (See Comments)    Muscle aches   Zithromax [Azithromycin] Other (See Comments)    GI-SIDE EFFECTS   Amlodipine Other (See Comments)    Family History  Problem Relation Age of Onset   Hypertension Brother    Diabetes Brother    Cancer Brother        bladder cancer   Diabetes Brother    CVA Brother    Hypertension Brother    Prostate cancer Brother    Diabetes Brother    Hypertension Brother    Heart disease Brother        CABG   Heart attack Brother    Stroke Brother     Prior to Admission medications   Medication Sig Start Date End Date Taking? Authorizing Provider  acetaminophen (TYLENOL) 500 MG tablet Take 2 tablets (1,000 mg total) by mouth every 8 (eight) hours as needed for mild pain or headache (or Fever >/= 101). 12/27/21   Vassie Loll, MD  allopurinol (ZYLOPRIM) 100 MG tablet Take 100 mg by mouth daily. 04/17/21   [provider]  apixaban (ELIQUIS) 5 MG TABS tablet TAKE 1 TABLET(5 MG) BY MOUTH TWICE DAILY 11/15/18   Quintella Reichert, MD  bisacodyl (DULCOLAX) 10 MG suppository Place 1 suppository (10 mg total) rectally daily as needed for moderate constipation. 12/27/21   Vassie Loll, MD  diclofenac Sodium (VOLTAREN) 1 % GEL Apply 4 g topically 4 (four) times daily. 12/27/21   Vassie Loll, MD  diltiazem (CARDIZEM CD) 300 MG 24 hr capsule Take 1 capsule (300 mg total) by mouth daily. 02/17/22 02/18/23  Gaston Islam., NP  fish oil-omega-3 fatty acids 1000 MG capsule Take 2 g by mouth 2 (two) times daily.     [provider]  furosemide (LASIX) 40 MG tablet Take 1 tablet (40 mg total) by mouth daily as needed for fluid  or edema. Patient taking differently: Take 40 mg by mouth in the morning and at bedtime. 12/27/21   Vassie Loll, MD  loratadine (CLARITIN) 10 MG tablet Take 10 mg by mouth daily as needed for allergies.     [provider]  losartan (COZAAR) 25 MG tablet Take 0.5 tablets (12.5 mg total) by mouth daily. 12/27/21 12/27/22  Vassie Loll, MD  metFORMIN (GLUCOPHAGE) 1000 MG tablet Take 1,000 mg by mouth 2 (two) times daily. 06/27/14   [provider]  metoprolol  succinate (TOPROL-XL) 50 MG 24 hr tablet Take 50 mg by mouth daily.  08/10/13   [provider]  MOUNJARO 12.5 MG/0.5ML Pen Inject 12.5 mg into the skin once a week. 01/22/22   [provider]  Multiple Vitamins-Minerals (MULTIVITAMIN WITH MINERALS) tablet Take 1 tablet by mouth daily.    [provider]  omeprazole (PRILOSEC) 20 MG capsule Take 2 capsules (40 mg total) by mouth 2 (two) times daily before a meal. 12/27/21   Vassie Loll, MD  polyethylene glycol (MIRALAX / GLYCOLAX) 17 g packet Take 17 g by mouth 3 (three) times daily. Titrate down to PRN base on further resolution of his ileus. 12/27/21   Vassie Loll, MD  potassium chloride SA (KLOR-CON M) 20 MEQ tablet TAKE 1 TABLET BY MOUTH  DAILY AS NEEDED WHILE  TAKING FUROSEMIDE Patient taking differently: Take 20 mEq by mouth daily. 06/28/21   Corky Crafts, MD  pregabalin (LYRICA) 100 MG capsule Take 100 mg by mouth 2 (two) times a day.  09/14/18   [provider]  traMADol (ULTRAM) 50 MG tablet Take 1 tablet (50 mg total) by mouth every 8 (eight) hours as needed for severe pain. 12/27/21   Vassie Loll, MD  ZETIA 10 MG tablet Take 10 mg by mouth daily.  01/17/13   [provider]    Physical Exam: Vitals:   11/11/22 1215 11/11/22 1230 11/11/22 1341 11/11/22 1600  BP: (!) 167/103  114/72 122/80  Pulse: 99 99 98 (!) 103  Resp: 17 (!) 25 (!) 23 (!) 24  Temp:   98.5 F (36.9 C)   TempSrc:   Oral   SpO2: 93% 90% 91% 92%   Weight:      Height:        Constitutional: NAD, calm, comfortable Vitals:   11/11/22 1215 11/11/22 1230 11/11/22 1341 11/11/22 1600  BP: (!) 167/103  114/72 122/80  Pulse: 99 99 98 (!) 103  Resp: 17 (!) 25 (!) 23 (!) 24  Temp:   98.5 F (36.9 C)   TempSrc:   Oral   SpO2: 93% 90% 91% 92%  Weight:      Height:       Eyes: PERRL, lids and conjunctivae normal ENMT: Mucous membranes are moist.   Neck: normal, supple, no masses, no thyromegaly Respiratory: clear to auscultation bilaterally, no wheezing, no crackles. Normal respiratory effort. No accessory muscle use.  On room air. Cardiovascular: Tachycardia, irregular rate and rhythm, no murmurs / rubs / gallops.  1+ pitting bilateral lower extremity edema to mid leg .  Extremities warm.  Abdomen: no tenderness, no masses palpated. No hepatosplenomegaly. Bowel sounds positive.  Musculoskeletal: no clubbing / cyanosis. No joint deformity upper and lower extremities.  Skin: no rashes, lesions, ulcers. No induration Neurologic: 5/5 strength in bilateral upper extremity, 3/5 strength to right lower extremity, 4/5 strength to left lower extremity.  Psychiatric: Normal judgment and insight. Alert and oriented x 3. Normal mood.   Labs on Admission: I have personally reviewed following labs and imaging studies  CBC: Recent Labs  Lab 11/11/22 1210  WBC 7.3  NEUTROABS 4.7  HGB 13.0  HCT 40.2  MCV 81.7  PLT 272   Basic Metabolic Panel: Recent Labs  Lab 11/11/22 1210  NA 137  K 3.1*  CL 101  CO2 26  GLUCOSE 159*  BUN 14  CREATININE 1.09  CALCIUM 8.2*   GFR: Estimated Creatinine Clearance: 73.4 mL/min (by C-G formula based on SCr of 1.09  mg/dL). Liver Function Tests: Recent Labs  Lab 11/11/22 1210  AST 21  ALT 24  ALKPHOS 126  BILITOT 0.9  PROT 6.2*  ALBUMIN 3.4*    Radiological Exams on Admission: CT Head Wo Contrast  Result Date: 11/11/2022 CLINICAL DATA:  Trauma EXAM: CT HEAD WITHOUT CONTRAST TECHNIQUE:  Contiguous axial images were obtained from the base of the skull through the vertex without intravenous contrast. RADIATION DOSE REDUCTION: This exam was performed according to the departmental dose-optimization program which includes automated exposure control, adjustment of the mA and/or kV according to patient size and/or use of iterative reconstruction technique. COMPARISON:  None Available. FINDINGS: Brain: No evidence of acute infarction, hemorrhage, hydrocephalus, extra-axial collection or mass lesion/mass effect. There is mild diffuse atrophy and mild periventricular white matter hypodensity, likely chronic small vessel ischemic change. There is an old lacunar infarct in the right basal ganglia. Vascular: Atherosclerotic calcifications are present within the cavernous internal carotid arteries. Skull: Normal. Negative for fracture or focal lesion. Sinuses/Orbits: No acute finding. Other: None. IMPRESSION: 1. No acute intracranial process. 2. Mild diffuse atrophy and mild chronic small vessel ischemic change. Electronically Signed   By: Darliss Cheney M.D.   On: 11/11/2022 17:20   DG Chest Port 1 View  Result Date: 11/11/2022 CLINICAL DATA:  Fall, confusion. EXAM: PORTABLE CHEST 1 VIEW COMPARISON:  Chest radiograph 02/10/2022 FINDINGS: The heart is enlarged, unchanged. The upper mediastinal contours are normal. There is vascular congestion with possible mild pulmonary interstitial edema. There is no focal consolidation. There is no significant pleural effusion. There is no pneumothorax A remote right clavicle fracture is noted. There is no acute osseous abnormality. IMPRESSION: Cardiomegaly with vascular congestion and possible mild pulmonary interstitial edema. Electronically Signed   By: Lesia Hausen M.D.   On: 11/11/2022 13:24    EKG: Independently reviewed.  Atrial fibrillation, rate 93, QTc 473.  Assessment/Plan Principal Problem:   Acute on chronic diastolic (congestive) heart failure  (HCC) Active Problems:   Weakness of right lower extremity   Hypokalemia   Essential hypertension, benign   Diabetes mellitus (HCC)   Obstructive sleep apnea   Atrial fibrillation (HCC)   Hepatocellular carcinoma (HCC)   HCC (hepatocellular carcinoma) (HCC)  Assessment and Plan: * Acute on chronic diastolic (congestive) heart failure (HCC) Decompensated CHF, last echo 2022 EF of 50 to 55%, grade 1 DD.  Chest x-ray showing vascular congestion, mild interstitial edema.  BNP elevated at 457. - Lasix 60 mg x 1 given in ED, continue 40 twice daily -Input output, daily weight, daily BMP -Obtain updated echocardiogram  Hypokalemia Potassium 3.1.  Likely 2/2 diuretics.  Mag 1.7. -Replete K.  - IV mag 2g x 1  Weakness of right lower extremity He is unable to lift his right lower extremity against gravity, but has good strength to left lower extremity- Unknown duration.  Head CT negative for acute abnormality.  Presenting with generalized weakness.  No focal neurologic deficits.  Sensation intact.  On anticoagulation with Eliquis. -MRI brain without contrast in a.m. - PT eval  Hepatocellular carcinoma (HCC) Last IR notes 04/2022, technically successful hepatic microwave ablation performed 11/24/2018 and transcatheter bland embolization performed 02/15/2018 for biopsy-proven hepatocellular carcinoma.  Currently under surveillance with yearly abdominal MRI.   Atrial fibrillation (HCC) Total on anticoagulation with Eliquis. -Resume diltiazem, Metoprolol, Eliquis  Diabetes mellitus (HCC) - SSI- S -Hold home metformin - Hgba1c  Essential hypertension, benign Stable -  resume diltiazem, losartan, metoprolol.    DVT prophylaxis: Eliquis Code  Status: DNR-confirmed with patient and daughter-in-law Meriam Sprague at bedside. Family Communication: Daughter-in-law Meriam Sprague at bedside.  Herself and patient's son Perlie Gold are both patient's HCPOA Disposition Plan: ~ 2 days Consults called: None   Admission status: Inpt tele  I certify that at the point of admission it is my clinical judgment that the patient will require inpatient hospital care spanning beyond 2 midnights from the point of admission due to high intensity of service, high risk for further deterioration and high frequency of surveillance required.   Author: Onnie Boer, MD 11/11/2022 6:17 PM  For on call review [email or web address].

## 2022-11-11 NOTE — Assessment & Plan Note (Addendum)
Potassium 3.4.   2/2 diuretics.  -Replete K further on 6/20 - IV mag 2g x 1 given on 6/19 and repleted

## 2022-11-11 NOTE — Assessment & Plan Note (Addendum)
Full anticoagulation with apixaban -Resumed diltiazem, Metoprolol, apixaban -increased dose to 75 mg BID due to poorly controlled heart rates  -much better HR control in last 48 hours

## 2022-11-11 NOTE — Assessment & Plan Note (Signed)
Last IR notes 04/2022, technically successful hepatic microwave ablation performed 11/24/2018 and transcatheter bland embolization performed 02/15/2018 for biopsy-proven hepatocellular carcinoma.  Currently under surveillance with yearly abdominal MRI.

## 2022-11-11 NOTE — ED Triage Notes (Signed)
Pt relative had EMS called for falling out of bed and after helping him, declined transport. Later EMS was called due to him being confused. EMS reported him being in AFIB.

## 2022-11-11 NOTE — Assessment & Plan Note (Addendum)
He was initially unable to lift his right lower extremity against gravity, but has good strength to left lower extremity- this has slowly improved.  Head CT negative for acute abnormality.  Presenting with generalized weakness.  No focal neurologic deficits.  Sensation intact.  On anticoagulation with Eliquis. -MRI brain without contrast NEGATIVE FOR ACUTE FINDINGS - PT eval requested and recommending SNF and pt agreeable

## 2022-11-11 NOTE — Assessment & Plan Note (Addendum)
Stable -  resumed diltiazem, metoprolol.

## 2022-11-11 NOTE — Assessment & Plan Note (Addendum)
Decompensated CHF, last echo 2022 EF of 50 to 55%, grade 1 DD.  Chest x-ray showing vascular congestion, mild interstitial edema.  BNP initially elevated at 457. - Lasix 60 mg x 1 given in ED, IV lasix given, DC IV lasix today with bump in Cr, resume oral lasix 40 twice daily starting 11/15/22.  RedsVest reading 6/22 is 28 and reassuring  -Input output, daily weight, daily BMP monitored in hospital  -updated echocardiogram: LVEF 55-60% no RWMAs, indeterminate diastolic parameters Filed Weights   11/13/22 0500 11/14/22 0506 11/15/22 0434  Weight: 123.8 kg 122.7 kg 124.3 kg    Intake/Output Summary (Last 24 hours) at 11/15/2022 1123 Last data filed at 11/15/2022 0917 Gross per 24 hour  Intake 358 ml  Output 650 ml  Net -292 ml

## 2022-11-11 NOTE — ED Provider Notes (Signed)
[place] EMERGENCY DEPARTMENT AT [facility] Provider Note   CSN: [id number] Arrival date & time: 11/11/22  1152     History  Chief Complaint  Patient presents with   Atrial Fibrillation    Steve Jensen is a 82 y.o. male.  HPI Patient presents via EMS with concern for confusion, A-fib.  History is obtained by EMS individuals and the patient himself.  He has a history of A-fib.  Per EMS the family noticed the patient was more confused than usual, with weakness today.  After EMS initially arrived, evaluated the patient, transport was declined.  However, with persistent difficulty with ambulation, EMS was called again and the patient was more agreeable to presenting for evaluation. Patient denies focal pain, states that he feels poorly in general.  He states that he has been taking his medication as directed.  EMS reports the patient was in A-fib, with rapid ventricular response on arrival, heart rate improved with Cardizem, but blood pressure declined.  Subsequently the patient received normal saline with resultant increase in his blood pressure.     Home Medications Prior to Admission medications   Medication Sig Start Date End Date Taking? Authorizing Provider  acetaminophen (TYLENOL) 500 MG tablet Take 2 tablets (1,000 mg total) by mouth every 8 (eight) hours as needed for mild pain or headache (or Fever >/= 101). 12/27/21   Vassie Loll, MD  allopurinol (ZYLOPRIM) 100 MG tablet Take 100 mg by mouth daily. 04/17/21   [provider]  apixaban (ELIQUIS) 5 MG TABS tablet TAKE 1 TABLET(5 MG) BY MOUTH TWICE DAILY 11/15/18   Quintella Reichert, MD  bisacodyl (DULCOLAX) 10 MG suppository Place 1 suppository (10 mg total) rectally daily as needed for moderate constipation. 12/27/21   Vassie Loll, MD  diclofenac Sodium (VOLTAREN) 1 % GEL Apply 4 g topically 4 (four) times daily. 12/27/21   Vassie Loll, MD  diltiazem (CARDIZEM CD) 300 MG 24 hr capsule Take 1 capsule (300  mg total) by mouth daily. 02/17/22 02/18/23  Gaston Islam., NP  fish oil-omega-3 fatty acids 1000 MG capsule Take 2 g by mouth 2 (two) times daily.     [provider]  furosemide (LASIX) 40 MG tablet Take 1 tablet (40 mg total) by mouth daily as needed for fluid or edema. Patient taking differently: Take 40 mg by mouth in the morning and at bedtime. 12/27/21   Vassie Loll, MD  loratadine (CLARITIN) 10 MG tablet Take 10 mg by mouth daily as needed for allergies.     [provider]  losartan (COZAAR) 25 MG tablet Take 0.5 tablets (12.5 mg total) by mouth daily. 12/27/21 12/27/22  Vassie Loll, MD  metFORMIN (GLUCOPHAGE) 1000 MG tablet Take 1,000 mg by mouth 2 (two) times daily. 06/27/14   [provider]  metoprolol succinate (TOPROL-XL) 50 MG 24 hr tablet Take 50 mg by mouth daily.  08/10/13   [provider]  MOUNJARO 12.5 MG/0.5ML Pen Inject 12.5 mg into the skin once a week. 01/22/22   [provider]  Multiple Vitamins-Minerals (MULTIVITAMIN WITH MINERALS) tablet Take 1 tablet by mouth daily.    [provider]  omeprazole (PRILOSEC) 20 MG capsule Take 2 capsules (40 mg total) by mouth 2 (two) times daily before a meal. 12/27/21   Vassie Loll, MD  polyethylene glycol (MIRALAX / GLYCOLAX) 17 g packet Take 17 g by mouth 3 (three) times daily. Titrate down to PRN base on further resolution of his  ileus. 12/27/21   Vassie Loll, MD  potassium chloride SA (KLOR-CON M) 20 MEQ tablet TAKE 1 TABLET BY MOUTH  DAILY AS NEEDED WHILE  TAKING FUROSEMIDE Patient taking differently: Take 20 mEq by mouth daily. 06/28/21   Corky Crafts, MD  pregabalin (LYRICA) 100 MG capsule Take 100 mg by mouth 2 (two) times a day.  09/14/18   [provider]  traMADol (ULTRAM) 50 MG tablet Take 1 tablet (50 mg total) by mouth every 8 (eight) hours as needed for severe pain. 12/27/21   Vassie Loll, MD  ZETIA 10 MG tablet Take 10 mg by mouth daily.  01/17/13    [provider]      Allergies    Lipitor [atorvastatin], Simvastatin, Zithromax [azithromycin], and Amlodipine    Review of Systems   Review of Systems  All other systems reviewed and are negative.   Physical Exam Updated Vital Signs BP 114/72 (BP Location: Right Arm)   Pulse 98   Temp 98.5 F (36.9 C) (Oral)   Resp (!) 23   Ht 5\' 11"  (1.803 m)   Wt 127 kg   SpO2 91%   BMI 39.05 kg/m  Physical Exam Vitals and nursing note reviewed.  Constitutional:      General: He is not in acute distress.    Appearance: He is well-developed. He is obese. He is ill-appearing. He is not toxic-appearing.  HENT:     Head: Normocephalic and atraumatic.  Eyes:     Conjunctiva/sclera: Conjunctivae normal.  Cardiovascular:     Rate and Rhythm: Tachycardia present. Rhythm irregular.  Pulmonary:     Effort: Tachypnea present. No respiratory distress.     Breath sounds: Decreased air movement present. No stridor.  Abdominal:     General: There is no distension.     Tenderness: There is no abdominal tenderness. There is no guarding.  Musculoskeletal:     Right lower leg: Edema present.     Left lower leg: Edema present.  Skin:    General: Skin is warm and dry.  Neurological:     Mental Status: He is alert and oriented to person, place, and time.     ED Results / Procedures / Treatments   Labs (all labs ordered are listed, but only abnormal results are displayed) Labs Reviewed  COMPREHENSIVE METABOLIC PANEL - Abnormal; Notable for the following components:      Result Value   Potassium 3.1 (*)    Glucose, Bld 159 (*)    Calcium 8.2 (*)    Total Protein 6.2 (*)    Albumin 3.4 (*)    All other components within normal limits  CBC WITH DIFFERENTIAL/PLATELET - Abnormal; Notable for the following components:   RDW 17.7 (*)    Monocytes Absolute 1.5 (*)    Abs Immature Granulocytes 0.15 (*)    All other components within normal limits  BRAIN NATRIURETIC PEPTIDE - Abnormal;  Notable for the following components:   B Natriuretic Peptide 457.0 (*)    All other components within normal limits  ETHANOL  LACTIC ACID, PLASMA  LACTIC ACID, PLASMA  TROPONIN I (HIGH SENSITIVITY)  TROPONIN I (HIGH SENSITIVITY)    EKG EKG Interpretation  Date/Time:  Tuesday November 11 2022 12:01:13 EDT Ventricular Rate:  93 PR Interval:    QRS Duration: 95 QT Interval:  380 QTC Calculation: 473 R Axis:   85 Text Interpretation: Atrial fibrillation Borderline right axis deviation Borderline repolarization abnormality Artifact Abnormal ECG Confirmed by Gerhard Munch (  [id number]) on 11/11/2022 12:52:41 PM  Radiology DG Chest Port 1 View  Result Date: 11/11/2022 CLINICAL DATA:  Fall, confusion. EXAM: PORTABLE CHEST 1 VIEW COMPARISON:  Chest radiograph 02/10/2022 FINDINGS: The heart is enlarged, unchanged. The upper mediastinal contours are normal. There is vascular congestion with possible mild pulmonary interstitial edema. There is no focal consolidation. There is no significant pleural effusion. There is no pneumothorax A remote right clavicle fracture is noted. There is no acute osseous abnormality. IMPRESSION: Cardiomegaly with vascular congestion and possible mild pulmonary interstitial edema. Electronically Signed   By: Lesia Hausen M.D.   On: 11/11/2022 13:24    Procedures Procedures    Medications Ordered in ED Medications  potassium chloride 10 mEq in 100 mL IVPB (10 mEq Intravenous New Bag/Given 11/11/22 1511)  potassium chloride SA (KLOR-CON M) CR tablet 40 mEq (40 mEq Oral Given 11/11/22 1459)  furosemide (LASIX) injection 60 mg (60 mg Intravenous Given 11/11/22 1507)    ED Course/ Medical Decision Making/ A&P                             Medical Decision Making Elderly male with A-fib, hypertension, diabetes presents with weakness, recurrent and worsening this morning.  Patient's vital signs en route suggest A-fib with RVR, and his physical exam is consistent with heart  failure exacerbation, with lower extremity edema, cardiomegaly on x-ray, and increased work of breathing. Patient's heart rate reduced below 100 with Cardizem, he received IV Lasix 60 mg, and with concern for hypokalemia received IV repletion of this as well.  Patient plateaued, and was stable for admission following these interventions.   Amount and/or Complexity of Data Reviewed Independent Historian: EMS External Data Reviewed: notes. Labs: ordered. Decision-making details documented in ED Course. Radiology: ordered and independent interpretation performed. Decision-making details documented in ED Course. ECG/medicine tests: ordered and independent interpretation performed. Decision-making details documented in ED Course.  Risk Prescription drug management. Decision regarding hospitalization. Diagnosis or treatment significantly limited by social determinants of health.  Echo with grade 1 diastolic dysfunction.   3:23 PM Patient in similar condition, aware of need for admission.  Work of breathing has diminished somewhat but he remains tachypneic.        Final Clinical Impression(s) / ED Diagnoses Final diagnoses:  Acute on chronic congestive heart failure, unspecified heart failure type (HCC)  Atrial fibrillation with RVR (HCC)  Hypokalemia   CRITICAL CARE Performed by: Gerhard Munch Total critical care time: 35 minutes Critical care time was exclusive of separately billable procedures and treating other patients. Critical care was necessary to treat or prevent imminent or life-threatening deterioration. Critical care was time spent personally by me on the following activities: development of treatment plan with patient and/or surrogate as well as nursing, discussions with consultants, evaluation of patient's response to treatment, examination of patient, obtaining history from patient or surrogate, ordering and performing treatments and interventions, ordering and review  of laboratory studies, ordering and review of radiographic studies, pulse oximetry and re-evaluation of patient's condition.    Gerhard Munch, MD 11/11/22 1525

## 2022-11-11 NOTE — ED Notes (Signed)
..ED TO INPATIENT HANDOFF REPORT  ED Nurse Name and Phone #: [phone]  S Name/Age/Gender Steve Jensen 82 y.o. male Room/Bed: APA18/APA18  Code Status   Code Status: DNR  Home/SNF/Other Home Patient oriented to: self, place, and situation Is this baseline? Yes   Triage Complete: Triage complete  Chief Complaint Acute on chronic diastolic (congestive) heart failure (HCC) [I50.33]  Triage Note Pt relative had EMS called for falling out of bed and after helping him, declined transport. Later EMS was called due to him being confused. EMS reported him being in AFIB.   Allergies Allergies  Allergen Reactions   Lipitor [Atorvastatin] Other (See Comments)    Muscle aches   Simvastatin Other (See Comments)    Muscle aches   Zithromax [Azithromycin] Other (See Comments)    GI-SIDE EFFECTS   Amlodipine Other (See Comments)    Level of Care/Admitting Diagnosis ED Disposition     ED Disposition  Admit   Condition  --   Comment  Hospital Area: [facility] [[id number]]  Level of Care: Telemetry [5]  Covid Evaluation: Asymptomatic - no recent exposure (last 10 days) testing not required  Diagnosis: Acute on chronic diastolic (congestive) heart failure [facility]) [[id number]]  Admitting Physician: Onnie Boer [phone]  Attending Physician: Onnie Boer [email or web address]  Certification:: I certify this patient will need inpatient services for at least 2 midnights  Estimated Length of Stay: 2          B Medical/Surgery History Past Medical History:  Diagnosis Date   Anemia    Diabetes mellitus type II, controlled (HCC)    with neuropathy   Dysrhythmia    A-Fib. cardioversion done   GERD (gastroesophageal reflux disease)    Hyperlipidemia    Hypertension    Morbid obesity (HCC)    Neuromuscular disorder (HCC)    feet   OSA (obstructive sleep apnea)    On BiPAP at 15/11cm H2O   Prostate cancer (HCC)    Shingles    on face Nov 2010   Urinary incontinence     Past Surgical History:  Procedure Laterality Date   CARDIOVERSION N/A 09/06/2013   Procedure: CARDIOVERSION;  Surgeon: Corky Crafts, MD;  Location: [facility] ENDOSCOPY;  Service: Cardiovascular;  Laterality: N/A;   EYE SURGERY     cataract surgery bilat; surgery to correct droopy eyelids    FLEXIBLE SIGMOIDOSCOPY N/A 12/24/2021   Procedure: FLEXIBLE SIGMOIDOSCOPY;  Surgeon: Dolores Frame, MD;  Location: [facility] ENDO SUITE;  Service: Gastroenterology;  Laterality: N/A;   IR ANGIOGRAM SELECTIVE EACH ADDITIONAL VESSEL  02/15/2018   IR ANGIOGRAM SELECTIVE EACH ADDITIONAL VESSEL  02/15/2018   IR ANGIOGRAM SELECTIVE EACH ADDITIONAL VESSEL  02/15/2018   IR ANGIOGRAM SELECTIVE EACH ADDITIONAL VESSEL  02/15/2018   IR ANGIOGRAM SELECTIVE EACH ADDITIONAL VESSEL  02/15/2018   IR ANGIOGRAM VISCERAL SELECTIVE  02/15/2018   IR ANGIOGRAM VISCERAL SELECTIVE  02/15/2018   IR EMBO TUMOR ORGAN ISCHEMIA INFARCT INC GUIDE ROADMAPPING  02/15/2018   IR RADIOLOGIST EVAL & MGMT  01/26/2018   IR RADIOLOGIST EVAL & MGMT  03/17/2018   IR RADIOLOGIST EVAL & MGMT  06/29/2018   IR RADIOLOGIST EVAL & MGMT  07/27/2018   IR RADIOLOGIST EVAL & MGMT  10/28/2018   IR RADIOLOGIST EVAL & MGMT  12/14/2018   IR RADIOLOGIST EVAL & MGMT  03/17/2019   IR RADIOLOGIST EVAL & MGMT  07/14/2019   IR RADIOLOGIST EVAL & MGMT  12/27/2019   IR RADIOLOGIST EVAL & MGMT  08/21/2020   IR RADIOLOGIST EVAL & MGMT  02/28/2021   IR RADIOLOGIST EVAL & MGMT  06/25/2021   IR RADIOLOGIST EVAL & MGMT  10/18/2021   IR US GUIDE VASC ACCESS RIGHT  02/15/2018   knee surgery bilat      PROSTATE SURGERY     RADIOLOGY WITH ANESTHESIA N/A 11/24/2018   Procedure: MICROWAVE THERMAL ABLATION LIVER;  Surgeon: Simonne Come, MD;  Location: [facility] ORS;  Service: Anesthesiology;  Laterality: N/A;   right shoulder rotator cuff surgery     TONSILLECTOMY       A IV Location/Drains/Wounds Patient Lines/Drains/Airways Status     Active Line/Drains/Airways     Name Placement date  Placement time Site Days   Peripheral IV 11/11/22 20 G 1" Left Antecubital 11/11/22  1130  Antecubital  less than 1            Intake/Output Last 24 hours  Intake/Output Summary (Last 24 hours) at 11/11/2022 1908 Last data filed at 11/11/2022 1611 Gross per 24 hour  Intake 345.67 ml  Output 600 ml  Net -254.33 ml    Labs/Imaging Results for orders placed or performed during the hospital encounter of 11/11/22 (from the past 48 hour(s))  Comprehensive metabolic panel     Status: Abnormal   Collection Time: 11/11/22 12:10 PM  Result Value Ref Range   Sodium 137 135 - 145 mmol/L   Potassium 3.1 (L) 3.5 - 5.1 mmol/L   Chloride 101 98 - 111 mmol/L   CO2 26 22 - 32 mmol/L   Glucose, Bld 159 (H) 70 - 99 mg/dL    Comment: Glucose reference range applies only to samples taken after fasting for at least 8 hours.   BUN 14 8 - 23 mg/dL   Creatinine, Ser 6.04 0.61 - 1.24 mg/dL   Calcium 8.2 (L) 8.9 - 10.3 mg/dL   Total Protein 6.2 (L) 6.5 - 8.1 g/dL   Albumin 3.4 (L) 3.5 - 5.0 g/dL   AST 21 15 - 41 U/L   ALT 24 0 - 44 U/L   Alkaline Phosphatase 126 38 - 126 U/L   Total Bilirubin 0.9 0.3 - 1.2 mg/dL   GFR, Estimated >54 >09 mL/min    Comment: (NOTE) Calculated using the CKD-EPI Creatinine Equation (2021)    Anion gap 10 5 - 15    Comment: Performed at [facility], [address] [place], [place] [address]  Ethanol     Status: None   Collection Time: 11/11/22 12:10 PM  Result Value Ref Range   Alcohol, Ethyl (B) <10 <10 mg/dL    Comment: (NOTE) Lowest detectable limit for serum alcohol is 10 mg/dL.  For medical purposes only. Performed at [facility], [address], [place], [place] [address]   CBC with Differential     Status: Abnormal   Collection Time: 11/11/22 12:10 PM  Result Value Ref Range   WBC 7.3 4.0 - 10.5 K/uL   RBC 4.92 4.22 - 5.81 MIL/uL   Hemoglobin 13.0 13.0 - 17.0 g/dL   HCT 56.2 13.0 - 86.5 %   MCV 81.7 80.0 - 100.0 fL   MCH 26.4 26.0 - 34.0 pg   MCHC  32.3 30.0 - 36.0 g/dL   RDW 78.4 (H) 69.6 - 29.5 %   Platelets 272 150 - 400 K/uL   nRBC 0.0 0.0 - 0.2 %   Neutrophils Relative % 65 %   Neutro Abs 4.7 1.7 - 7.7 K/uL   Lymphocytes Relative 12 %  Lymphs Abs 0.9 0.7 - 4.0 K/uL   Monocytes Relative 20 %   Monocytes Absolute 1.5 (H) 0.1 - 1.0 K/uL   Eosinophils Relative 0 %   Eosinophils Absolute 0.0 0.0 - 0.5 K/uL   Basophils Relative 1 %   Basophils Absolute 0.1 0.0 - 0.1 K/uL   Immature Granulocytes 2 %   Abs Immature Granulocytes 0.15 (H) 0.00 - 0.07 K/uL    Comment: Performed at [facility], [address] [place], [place] [address]  Brain natriuretic peptide     Status: Abnormal   Collection Time: 11/11/22 12:10 PM  Result Value Ref Range   B Natriuretic Peptide 457.0 (H) 0.0 - 100.0 pg/mL    Comment: Performed at [facility], [address] [place], [place] [address]  Troponin I (High Sensitivity)     Status: None   Collection Time: 11/11/22 12:10 PM  Result Value Ref Range   Troponin I (High Sensitivity) 9 <18 ng/L    Comment: (NOTE) Elevated high sensitivity troponin I (hsTnI) values and significant  changes across serial measurements may suggest ACS but many other  chronic and acute conditions are known to elevate hsTnI results.  Refer to the "Links" section for chest pain algorithms and additional  guidance. Performed at [facility], [address] [place], [place] [address]   Lactic acid, plasma     Status: None   Collection Time: 11/11/22 12:25 PM  Result Value Ref Range   Lactic Acid, Venous 1.6 0.5 - 1.9 mmol/L    Comment: Performed at [facility], [address], [place], [place] [address]  Lactic acid, plasma     Status: None   Collection Time: 11/11/22  2:11 PM  Result Value Ref Range   Lactic Acid, Venous 1.6 0.5 - 1.9 mmol/L    Comment: Performed at [facility], [address] [place], [place] [address]  Troponin I (High Sensitivity)     Status: None   Collection Time: 11/11/22  2:11 PM  Result  Value Ref Range   Troponin I (High Sensitivity) 10 <18 ng/L    Comment: (NOTE) Elevated high sensitivity troponin I (hsTnI) values and significant  changes across serial measurements may suggest ACS but many other  chronic and acute conditions are known to elevate hsTnI results.  Refer to the "Links" section for chest pain algorithms and additional  guidance. Performed at [facility], [address], [place], [place] [address]    CT Head Wo Contrast  Result Date: 11/11/2022 CLINICAL DATA:  Trauma EXAM: CT HEAD WITHOUT CONTRAST TECHNIQUE: Contiguous axial images were obtained from the base of the skull through the vertex without intravenous contrast. RADIATION DOSE REDUCTION: This exam was performed according to the departmental dose-optimization program which includes automated exposure control, adjustment of the mA and/or kV according to patient size and/or use of iterative reconstruction technique. COMPARISON:  None Available. FINDINGS: Brain: No evidence of acute infarction, hemorrhage, hydrocephalus, extra-axial collection or mass lesion/mass effect. There is mild diffuse atrophy and mild periventricular white matter hypodensity, likely chronic small vessel ischemic change. There is an old lacunar infarct in the right basal ganglia. Vascular: Atherosclerotic calcifications are present within the cavernous internal carotid arteries. Skull: Normal. Negative for fracture or focal lesion. Sinuses/Orbits: No acute finding. Other: None. IMPRESSION: 1. No acute intracranial process. 2. Mild diffuse atrophy and mild chronic small vessel ischemic change. Electronically Signed   By: Darliss Cheney M.D.   On: 11/11/2022 17:20   DG Chest [facility]  Result Date: 11/11/2022 CLINICAL DATA:  Fall, confusion. EXAM: PORTABLE CHEST 1 VIEW COMPARISON:  Chest radiograph 02/10/2022 FINDINGS: The heart is enlarged, unchanged. The upper mediastinal contours are normal. There is vascular congestion with possible mild  pulmonary interstitial edema. There is no focal consolidation. There is no significant pleural effusion. There is no pneumothorax A remote right clavicle fracture is noted. There is no acute osseous abnormality. IMPRESSION: Cardiomegaly with vascular congestion and possible mild pulmonary interstitial edema. Electronically Signed   By: Lesia Hausen M.D.   On: 11/11/2022 13:24    Pending Labs Unresulted Labs (From admission, onward)     Start     Ordered   11/11/22 1839  Magnesium  Add-on,   AD        11/11/22 1838            Vitals/Pain Today's Vitals   11/11/22 1215 11/11/22 1230 11/11/22 1341 11/11/22 1600  BP: (!) 167/103  114/72 122/80  Pulse: 99 99 98 (!) 103  Resp: 17 (!) 25 (!) 23 (!) 24  Temp:   98.5 F (36.9 C)   TempSrc:   Oral   SpO2: 93% 90% 91% 92%  Weight:      Height:        Isolation Precautions No active isolations  Medications Medications  potassium chloride 10 mEq in 100 mL IVPB (10 mEq Intravenous New Bag/Given 11/11/22 1712)  potassium chloride SA (KLOR-CON M) CR tablet 40 mEq (40 mEq Oral Given 11/11/22 1459)  furosemide (LASIX) injection 60 mg (60 mg Intravenous Given 11/11/22 1507)    Mobility walks     Focused Assessments Cardiac Assessment Handoff:  Cardiac Rhythm: Atrial fibrillation No results found for: "CKTOTAL", "CKMB", "CKMBINDEX", "TROPONINI" No results found for: "DDIMER" Does the Patient currently have chest pain? No    R Recommendations: See Admitting Provider Note  Report given to:   Additional Notes:   Pt is a&o hr between 88-110, has been given lasix and potassium, nothing for hr. Has a condom cath on.

## 2022-11-12 ENCOUNTER — Inpatient Hospital Stay (HOSPITAL_COMMUNITY): Payer: Medicare Other

## 2022-11-12 ENCOUNTER — Other Ambulatory Visit (HOSPITAL_COMMUNITY): Payer: Self-pay | Admitting: *Deleted

## 2022-11-12 DIAGNOSIS — I4891 Unspecified atrial fibrillation: Secondary | ICD-10-CM | POA: Diagnosis not present

## 2022-11-12 DIAGNOSIS — I10 Essential (primary) hypertension: Secondary | ICD-10-CM | POA: Diagnosis not present

## 2022-11-12 DIAGNOSIS — I5033 Acute on chronic diastolic (congestive) heart failure: Secondary | ICD-10-CM

## 2022-11-12 DIAGNOSIS — C220 Liver cell carcinoma: Secondary | ICD-10-CM | POA: Diagnosis not present

## 2022-11-12 LAB — ECHOCARDIOGRAM COMPLETE
Area-P 1/2: 3.85 cm2
Height: 71 in
S' Lateral: 3.4 cm
Weight: 4483.27 oz

## 2022-11-12 LAB — BASIC METABOLIC PANEL
Anion gap: 12 (ref 5–15)
BUN: 14 mg/dL (ref 8–23)
CO2: 24 mmol/L (ref 22–32)
Calcium: 8.1 mg/dL — ABNORMAL LOW (ref 8.9–10.3)
Chloride: 102 mmol/L (ref 98–111)
Creatinine, Ser: 1.17 mg/dL (ref 0.61–1.24)
GFR, Estimated: >60 mL/min (ref >60)
Glucose, Bld: 115 mg/dL — ABNORMAL HIGH (ref 70–99)
Potassium: 3.4 mmol/L — ABNORMAL LOW (ref 3.5–5.1)
Sodium: 138 mmol/L (ref 135–145)

## 2022-11-12 LAB — CBC
HCT: 43 % (ref 39.0–52.0)
Hemoglobin: 13.7 g/dL (ref 13.0–17.0)
MCH: 26 pg (ref 26.0–34.0)
MCHC: 31.9 g/dL (ref 30.0–36.0)
MCV: 81.7 fL (ref 80.0–100.0)
Platelets: 277 10*3/uL (ref 150–400)
RBC: 5.26 MIL/uL (ref 4.22–5.81)
RDW: 17.6 % — ABNORMAL HIGH (ref 11.5–15.5)
WBC: 6.8 10*3/uL (ref 4.0–10.5)
nRBC: 0 % (ref 0.0–0.2)

## 2022-11-12 LAB — GLUCOSE, CAPILLARY
Glucose-Capillary: 201 mg/dL — ABNORMAL HIGH (ref 70–99)
Glucose-Capillary: 149 mg/dL — ABNORMAL HIGH (ref 70–99)
Glucose-Capillary: 194 mg/dL — ABNORMAL HIGH (ref 70–99)

## 2022-11-12 LAB — HEMOGLOBIN A1C
Hgb A1c MFr Bld: 8 % — ABNORMAL HIGH (ref 4.8–5.6)
Mean Plasma Glucose: 182.9 mg/dL

## 2022-11-12 MED ORDER — INSULIN GLARGINE-YFGN 100 UNIT/ML ~~LOC~~ SOLN
12.0000 [IU] | Freq: Every day | SUBCUTANEOUS | Status: DC
  Administered 2022-11-12 – 2022-11-13 (×2): 12 [IU] via SUBCUTANEOUS
  Filled 2022-11-12 (×3): qty 0.12

## 2022-11-12 MED ORDER — INSULIN ASPART 100 UNIT/ML IJ SOLN
4.0000 [IU] | Freq: Three times a day (TID) | INTRAMUSCULAR | Status: DC
  Administered 2022-11-13: 4 [IU] via SUBCUTANEOUS

## 2022-11-12 MED ORDER — PREGABALIN 50 MG PO CAPS
100.0000 mg | ORAL_CAPSULE | Freq: Two times a day (BID) | ORAL | Status: DC
  Administered 2022-11-12 – 2022-11-15 (×6): 100 mg via ORAL
  Filled 2022-11-12 (×6): qty 2

## 2022-11-12 MED ORDER — POTASSIUM CHLORIDE CRYS ER 20 MEQ PO TBCR
40.0000 meq | EXTENDED_RELEASE_TABLET | Freq: Once | ORAL | Status: AC
  Administered 2022-11-12: 40 meq via ORAL
  Filled 2022-11-12: qty 2

## 2022-11-12 NOTE — [place] FL2 (Signed)
[place] MEDICAID FL2 LEVEL OF CARE FORM     IDENTIFICATION  Patient Name: Steve Jensen Birthdate: Oct 21, 1940 Sex: male Admission Date (Current Location): 11/11/2022  [facility] and [place] Number:  [employer] and Address:  [facility],  [address] [place] [address]      Provider Number: [id number]  Attending Physician Name and Address:  Cleora Fleet, MD  Relative Name and Phone Number:       Current Level of Care: Hospital Recommended Level of Care: Skilled Nursing Facility Prior Approval Number:    Date Approved/Denied:   PASRR Number: [id number]  Discharge Plan: SNF    Current Diagnoses: Patient Active Problem List   Diagnosis Date Noted   Acute on chronic diastolic (congestive) heart failure (HCC) 11/11/2022   Weakness of right lower extremity 11/11/2022   Hypokalemia 11/11/2022   Ileus (HCC)    Uncontrolled type 2 diabetes mellitus with hyperglycemia, without long-term current use of insulin (HCC) 12/17/2021   Osteoarthritis of right knee 12/17/2021   Community acquired pneumonia of left lower lobe of lung 12/16/2021   Sepsis due to CAP/PNA 12/16/2021   HCC (hepatocellular carcinoma) (HCC) 02/15/2018   Hepatocellular carcinoma (HCC) 12/30/2017   Pain in joint of left shoulder 11/09/2017   Hypertensive heart disease with heart failure (HCC) 03/23/2017   Chronic diastolic heart failure (HCC) 03/19/2015   Atrial fibrillation (HCC) 08/11/2013   S/P left knee arthroscopy 07/11/2013   Mixed hyperlipidemia 03/03/2013   Essential hypertension, benign 03/03/2013   Malignant neoplasm of prostate (HCC) 03/03/2013   Osteoarthrosis, unspecified whether generalized or localized, other specified sites 03/03/2013   Allergic rhinitis, cause unspecified 03/03/2013   Diabetes mellitus (HCC) 03/03/2013   Polyneuropathy in diabetes(357.2) 03/03/2013   Obstructive sleep apnea 03/03/2013   Proteinuria 03/03/2013   Morbid obesity (HCC)  03/03/2013   Right knee pain 02/07/2013    Orientation RESPIRATION BLADDER Height & Weight     Self, Situation, Place  Normal Incontinent Weight: 280 lb 3.3 oz (127.1 kg) Height:  5\' 11"  (180.3 cm)  BEHAVIORAL SYMPTOMS/MOOD NEUROLOGICAL BOWEL NUTRITION STATUS      Incontinent Diet (Heart healthy. See d/c summary for updates.)  AMBULATORY STATUS COMMUNICATION OF NEEDS Skin   Extensive Assist Verbally Normal                       Personal Care Assistance Level of Assistance  Bathing, Feeding, Dressing Bathing Assistance: Maximum assistance Feeding assistance: Limited assistance Dressing Assistance: Maximum assistance     Functional Limitations Info  Sight, Hearing, Speech Sight Info: Impaired Hearing Info: Adequate Speech Info: Adequate    SPECIAL CARE FACTORS FREQUENCY  PT (By licensed PT)     PT Frequency: 5x weekly              Contractures      Additional Factors Info  Code Status, Allergies Code Status Info: DNR Allergies Info: Lipitor (atorvastatin), Simvastatin, Zithromax (azithromycin), Amlodipine           Current Medications (11/12/2022):  This is the current hospital active medication list Current Facility-Administered Medications  Medication Dose Route Frequency Provider Last Rate Last Admin   acetaminophen (TYLENOL) tablet 650 mg  650 mg Oral Q6H PRN Emokpae, Ejiroghene E, MD       Or   acetaminophen (TYLENOL) suppository 650 mg  650 mg Rectal Q6H PRN Emokpae, Ejiroghene E, MD       apixaban (ELIQUIS) tablet 5 mg  5 mg  Oral BID Emokpae, Ejiroghene E, MD   5 mg at 11/12/22 0818   diltiazem (CARDIZEM CD) 24 hr capsule 300 mg  300 mg Oral QHS Emokpae, Ejiroghene E, MD   300 mg at 11/11/22 2136   furosemide (LASIX) injection 40 mg  40 mg Intravenous BID Emokpae, Ejiroghene E, MD   40 mg at 11/12/22 0818   insulin aspart (novoLOG) injection 0-5 Units  0-5 Units Subcutaneous QHS Emokpae, Ejiroghene E, MD   2 Units at 11/11/22 2303   insulin aspart  (novoLOG) injection 0-9 Units  0-9 Units Subcutaneous TID WC Emokpae, Ejiroghene E, MD   3 Units at 11/12/22 1156   insulin aspart (novoLOG) injection 4 Units  4 Units Subcutaneous TID WC Johnson, Clanford L, MD       insulin glargine-yfgn (SEMGLEE) injection 12 Units  12 Units Subcutaneous Daily Johnson, Clanford L, MD       ipratropium-albuterol (DUONEB) 0.5-2.5 (3) MG/3ML nebulizer solution 3 mL  3 mL Nebulization Q4H PRN Emokpae, Ejiroghene E, MD       metoprolol succinate (TOPROL-XL) 24 hr tablet 50 mg  50 mg Oral Daily Emokpae, Ejiroghene E, MD   50 mg at 11/12/22 0817   ondansetron (ZOFRAN) tablet 4 mg  4 mg Oral Q6H PRN Emokpae, Ejiroghene E, MD       Or   ondansetron (ZOFRAN) injection 4 mg  4 mg Intravenous Q6H PRN Emokpae, Ejiroghene E, MD       polyethylene glycol (MIRALAX / GLYCOLAX) packet 17 g  17 g Oral Daily PRN Emokpae, Ejiroghene E, MD       pregabalin (LYRICA) capsule 100 mg  100 mg Oral BID Laural Benes, Clanford L, MD         Discharge Medications: Please see discharge summary for a list of discharge medications.  Relevant Imaging Results:  Relevant Lab Results:   Additional Information SSN: [id number]  Karn Cassis, LCSW

## 2022-11-12 NOTE — Hospital Course (Addendum)
82 y.o. male with medical history significant for diastolic CHF, atrial fibrillation, hypertension, OSA, diabetes mellitus, liver cancer.  Patient was brought to the ED with reports of confusion, generalized weakness, feeling poorly today.  Patient did not exactly fall today, he was sitting halfway off his bed and with family present trying to assist him, he slid down to the floor on to his buttock.  Patient's daughter-in-law is at bedside, she reports patient has intermittent episodes of confusion, which is normal for him.  His mental status is currently unchanged from his baseline.  On my evaluation he is not confused.  He has had bilateral lower extremity swelling.  Baseline over the past 2 weeks.  Reports difficulty breathing for the past 3 to 4 weeks, cough.  No chest pain.  He is on Lasix 40 mg daily and compliant.  Does not consistently check his weight.   ED Course: Tmax 99.  Heart rate 97-103.  Respiratory rate 16-25.  O2 sats 91 to 94% on room air.  Chest x-ray showing vascular congestion with mild pulm interstitial edema. BNP elevated at 457. Troponin 9 > 10. Lactic acid 1.6 Head CT without contrast negative for acute intracranial process. IV Lasix 60 mg x 1 given.  Hospitalist to admit for decompensated CHF.

## 2022-11-12 NOTE — Plan of Care (Signed)
  Problem: Acute Rehab PT Goals(only PT should resolve) Goal: Pt Will Go Supine/Side To Sit Outcome: Progressing Flowsheets (Taken 11/12/2022 1415) Pt will go Supine/Side to Sit:  with minimal assist  with min guard assist Goal: Patient Will Transfer Sit To/From Stand Outcome: Progressing Flowsheets (Taken 11/12/2022 1415) Patient will transfer sit to/from stand: with minimal assist Goal: Pt Will Transfer Bed To Chair/Chair To Bed Outcome: Progressing Flowsheets (Taken 11/12/2022 1415) Pt will Transfer Bed to Chair/Chair to Bed: with min assist Goal: Pt Will Ambulate Outcome: Progressing Flowsheets (Taken 11/12/2022 1415) Pt will Ambulate:  25 feet  with minimal assist  with moderate assist  with rolling walker   2:15 PM, 11/12/22 Ocie Bob, MPT Physical Therapist with [facility] [phone] office [phone] mobile phone

## 2022-11-12 NOTE — TOC Initial Note (Signed)
Transition of Care [facility]) - Initial/Assessment Note    Patient Details  Name: Steve Jensen MRN: [id number] Date of Birth: [birth year]  Transition of Care [facility]) CM/SW Contact:    Karn Cassis, LCSW Phone Number: 11/12/2022, 3:09 PM  Clinical Narrative: Pt admitted due to acute on chronic diastolic heart failure. Pt reports he lives alone and is independent with ADLs at baseline. No home health prior to admission. PT evaluated pt and recommend SNF. Discussed placement process and Medicare.gov for ratings. Pt is unsure if he wants to consider SNF, but agreed to initiate bed search for options. Requests [facility] or [facility]. Will initiate bed search and SNF auth.                   Expected Discharge Plan: Skilled Nursing Facility Barriers to Discharge: Continued Medical Work up   Patient Goals and CMS Choice Patient states their goals for this hospitalization and ongoing recovery are:: unsure   Choice offered to / list presented to : Patient [place] ownership interest in [facility].provided to::  (pt not interested in [place] SNF)    Expected Discharge Plan and Services In-house Referral: Clinical Social Work     Living arrangements for the past 2 months: Single Family Home                                      Prior Living Arrangements/Services Living arrangements for the past 2 months: Single Family Home Lives with:: Self Patient language and need for interpreter reviewed:: Yes Do you feel safe going back to the place where you live?: Yes      Need for Family Participation in Patient Care: No (Comment)     Criminal Activity/Legal Involvement Pertinent to Current Situation/Hospitalization: No - Comment as needed  Activities of Daily Living Home Assistive Devices/Equipment: Cane (specify quad or straight), Walker (specify type), Shower chair with back ADL Screening (condition at time of admission) Patient's cognitive  ability adequate to safely complete daily activities?: Yes Is the patient deaf or have difficulty hearing?: No Does the patient have difficulty seeing, even when wearing glasses/contacts?: No Does the patient have difficulty concentrating, remembering, or making decisions?: No Patient able to express need for assistance with ADLs?: No Does the patient have difficulty dressing or bathing?: No Independently performs ADLs?: Yes (appropriate for developmental age) Does the patient have difficulty walking or climbing stairs?: Yes Weakness of Legs: Both Weakness of Arms/Hands: None  Permission Sought/Granted                  Emotional Assessment     Affect (typically observed): Appropriate Orientation: : Oriented to Self, Oriented to Place, Oriented to  Time, Oriented to Situation Alcohol / Substance Use: Not Applicable Psych Involvement: No (comment)  Admission diagnosis:  Hypokalemia [E87.6] Atrial fibrillation with RVR (HCC) [I48.91] Acute on chronic diastolic (congestive) heart failure (HCC) [I50.33] Acute on chronic congestive heart failure, unspecified heart failure type (HCC) [I50.9] Patient Active Problem List   Diagnosis Date Noted   Acute on chronic diastolic (congestive) heart failure (HCC) 11/11/2022   Weakness of right lower extremity 11/11/2022   Hypokalemia 11/11/2022   Ileus (HCC)    Uncontrolled type 2 diabetes mellitus with hyperglycemia, without long-term current use of insulin (HCC) 12/17/2021   Osteoarthritis of right knee 12/17/2021   Community acquired pneumonia of left lower lobe of lung 12/16/2021  Sepsis due to CAP/PNA 12/16/2021   HCC (hepatocellular carcinoma) (HCC) 02/15/2018   Hepatocellular carcinoma (HCC) 12/30/2017   Pain in joint of left shoulder 11/09/2017   Hypertensive heart disease with heart failure (HCC) 03/23/2017   Chronic diastolic heart failure (HCC) 03/19/2015   Atrial fibrillation (HCC) 08/11/2013   S/P left knee arthroscopy  07/11/2013   Mixed hyperlipidemia 03/03/2013   Essential hypertension, benign 03/03/2013   Malignant neoplasm of prostate (HCC) 03/03/2013   Osteoarthrosis, unspecified whether generalized or localized, other specified sites 03/03/2013   Allergic rhinitis, cause unspecified 03/03/2013   Diabetes mellitus (HCC) 03/03/2013   Polyneuropathy in diabetes(357.2) 03/03/2013   Obstructive sleep apnea 03/03/2013   Proteinuria 03/03/2013   Morbid obesity (HCC) 03/03/2013   Right knee pain 02/07/2013   PCP:  Lupita Raider, MD Pharmacy:   [name] - [place], [place] - [phone] [address] [place] [address] Phone: [phone] Fax: [phone]  [facility] [phone] - [place], [place] - [address] [place] [address] Phone: [phone] Fax: [phone]     Social Determinants of Health (SDOH) Social History: SDOH Screenings   Food Insecurity: No Food Insecurity (11/11/2022)  Housing: Patient Declined (11/11/2022)  Transportation Needs: No Transportation Needs (11/11/2022)  Utilities: Not At Risk (11/11/2022)  Tobacco Use: Medium Risk (03/11/2022)   SDOH Interventions:     Readmission Risk Interventions    12/26/2021   12:51 PM 12/25/2021    3:31 PM 12/19/2021   12:39 PM  Readmission Risk Prevention Plan  Transportation Screening  Complete Complete  PCP or Specialist Appt within 5-7 Days Complete    Home Care Screening  Complete   Medication Review (RN CM)  Complete   HRI or Home Care Consult   Complete  Social Work Consult for Recovery Care Planning/Counseling   Complete  Palliative Care Screening   Not Applicable  Medication Review Oceanographer)   Complete

## 2022-11-12 NOTE — Evaluation (Signed)
Physical Therapy Evaluation Patient Details Name: Steve Jensen MRN: [id number] DOB: October 27, 1940 Today's Date: 11/12/2022  History of Present Illness  Steve Jensen is a 82 y.o. male with medical history significant for diastolic CHF, atrial fibrillation, hypertension, OSA, diabetes mellitus, liver cancer.  Patient was brought to the ED with reports of confusion, generalized weakness, feeling poorly today.  Patient did not exactly fall today, he was sitting halfway off his bed and with family present trying to assist him, he slid down to the floor on to his buttock.   Patient's daughter-in-law is at bedside, she reports patient has intermittent episodes of confusion, which is normal for him.  His mental status is currently unchanged from his baseline.  On my evaluation he is not confused.  He has had bilateral lower extremity swelling.  Baseline over the past 2 weeks.  Reports difficulty breathing for the past 3 to 4 weeks, cough.  No chest pain.  He is on Lasix 40 mg daily and compliant.  Does not consistently check his weight.   Clinical Impression  Patient demonstrates slow labored movement for bed mobility, very unsteady on feet with frequent buckling of knees due to weakness using RW and limited to a few side steps before having to sit.  Patient tolerated staying up in chair after therapy.  Patient will benefit from continued skilled physical therapy in hospital and recommended venue below to increase strength, balance, endurance for safe ADLs and gait.          Recommendations for follow up therapy are one component of a multi-disciplinary discharge planning process, led by the attending physician.  Recommendations may be updated based on patient status, additional functional criteria and insurance authorization.  Follow Up Recommendations Can patient physically be transported by private vehicle: No     Assistance Recommended at Discharge Set up Supervision/Assistance  Patient can return  home with the following  A lot of help with bathing/dressing/bathroom;A lot of help with walking and/or transfers;Help with stairs or ramp for entrance;Assistance with cooking/housework    Equipment Recommendations None recommended by PT  Recommendations for Other Services       Functional Status Assessment Patient has had a recent decline in their functional status and demonstrates the ability to make significant improvements in function in a reasonable and predictable amount of time.     Precautions / Restrictions Precautions Precautions: Fall Restrictions Weight Bearing Restrictions: No      Mobility  Bed Mobility Overal bed mobility: Needs Assistance Bed Mobility: Sit to Supine, Supine to Sit     Supine to sit: Min assist Sit to supine: Min assist, Mod assist   General bed mobility comments: labored movement with difficulty moving legs onto bed during sit to supine, required use of bed rail during supine to sitting    Transfers Overall transfer level: Needs assistance Equipment used: Rolling walker (2 wheels) Transfers: Sit to/from Stand, Bed to chair/wheelchair/BSC Sit to Stand: Mod assist   Step pivot transfers: Mod assist       General transfer comment: unsteady labored movement with frequent buckling of knees    Ambulation/Gait Ambulation/Gait assistance: Mod assist, [place] assist Gait Distance (Feet): 3 Feet Assistive device: Rolling walker (2 wheels) Gait Pattern/deviations: Decreased step length - right, Decreased step length - left, Decreased stride length, Knees buckling Gait velocity: slow     General Gait Details: limited to a few side steps due to BLE weakness with frequent buckling of knees  Stairs  Wheelchair Mobility    Modified Rankin (Stroke Patients Only)       Balance Overall balance assessment: Needs assistance Sitting-balance support: Feet supported, No upper extremity supported Sitting balance-Leahy Scale:  Fair Sitting balance - Comments: fair/good seated at EOB   Standing balance support: Reliant on assistive device for balance, During functional activity, Bilateral upper extremity supported Standing balance-Leahy Scale: Poor Standing balance comment: using RW                             Pertinent Vitals/Pain Pain Assessment Pain Assessment: No/denies pain    Home Living Family/patient expects to be discharged to:: Private residence Living Arrangements: Children;Other relatives Available Help at Discharge: Family;Available 24 hours/day Type of Home: House Home Access: Stairs to enter Entrance Stairs-Rails: Doctor, general practice of Steps: 3   Home Layout: Two level;Laundry or work area in [employer] Equipment: Agricultural consultant (2 wheels);Cane - single point;BSC/3in1;Grab bars - tub/shower      Prior Function Prior Level of Function : Needs assist       Physical Assist : Mobility (physical);ADLs (physical)     Mobility Comments: Household and short community distances without use of AD ADLs Comments: Assisted by family     Hand Dominance   Dominant Hand: Right    Extremity/Trunk Assessment   Upper Extremity Assessment Upper Extremity Assessment: Generalized weakness    Lower Extremity Assessment Lower Extremity Assessment: Generalized weakness    Cervical / Trunk Assessment Cervical / Trunk Assessment: Kyphotic  Communication   Communication: No difficulties  Cognition Arousal/Alertness: Awake/alert Behavior During Therapy: WFL for tasks assessed/performed Overall Cognitive Status: Within Functional Limits for tasks assessed                                          General Comments      Exercises     Assessment/Plan    PT Assessment Patient needs continued PT services  PT Problem List Decreased strength;Decreased activity tolerance;Decreased balance;Decreased mobility       PT Treatment Interventions DME  instruction;Gait training;Stair training;Functional mobility training;Therapeutic activities;Therapeutic exercise;Patient/family education;Balance training    PT Goals (Current goals can be found in the Care Plan section)  Acute Rehab PT Goals Patient Stated Goal: return home with family to assist PT Goal Formulation: With patient Time For Goal Achievement: 11/26/22 Potential to Achieve Goals: Good    Frequency Min 3X/week     Co-evaluation               AM-PAC PT "6 Clicks" Mobility  Outcome Measure Help needed turning from your back to your side while in a flat bed without using bedrails?: A Little Help needed moving from lying on your back to sitting on the side of a flat bed without using bedrails?: A Little Help needed moving to and from a bed to a chair (including a wheelchair)?: A Lot Help needed standing up from a chair using your arms (e.g., wheelchair or bedside chair)?: A Lot Help needed to walk in hospital room?: A Lot Help needed climbing 3-5 steps with a railing? : Total 6 Click Score: 13    End of Session   Activity Tolerance: Patient tolerated treatment well;Patient limited by fatigue Patient left: in chair;with call bell/phone within reach Nurse Communication: Mobility status PT Visit Diagnosis: Unsteadiness on feet (R26.81);Other abnormalities of gait and  mobility (R26.89);Muscle weakness (generalized) (M62.81)    Time: 1610-9604 PT Time Calculation (min) (ACUTE ONLY): 20 min   Charges:   PT Evaluation $PT Eval Moderate Complexity: 1 Mod PT Treatments $Therapeutic Activity: 8-22 mins        2:14 PM, 11/12/22 Ocie Bob, MPT Physical Therapist with [facility] [phone] office [phone] mobile phone

## 2022-11-12 NOTE — Progress Notes (Signed)
Patient slept most of the night. No complaints of pain or discomfort. Plan of care ongoing.

## 2022-11-12 NOTE — Progress Notes (Addendum)
PROGRESS NOTE   Steve Jensen  ZOX:[id number] DOB: 07-Feb-1941 DOA: 11/11/2022 PCP: Lupita Raider, MD   Chief Complaint  Patient presents with   Atrial Fibrillation   Level of care: Telemetry  Brief Admission History:  82 y.o. male with medical history significant for diastolic CHF, atrial fibrillation, hypertension, OSA, diabetes mellitus, liver cancer.  Patient was brought to the ED with reports of confusion, generalized weakness, feeling poorly today.  Patient did not exactly fall today, he was sitting halfway off his bed and with family present trying to assist him, he slid down to the floor on to his buttock.  Patient's daughter-in-law is at bedside, she reports patient has intermittent episodes of confusion, which is normal for him.  His mental status is currently unchanged from his baseline.  On my evaluation he is not confused.  He has had bilateral lower extremity swelling.  Baseline over the past 2 weeks.  Reports difficulty breathing for the past 3 to 4 weeks, cough.  No chest pain.  He is on Lasix 40 mg daily and compliant.  Does not consistently check his weight.   ED Course: Tmax 99.  Heart rate 97-103.  Respiratory rate 16-25.  O2 sats 91 to 94% on room air.  Chest x-ray showing vascular congestion with mild pulm interstitial edema. BNP elevated at 457. Troponin 9 > 10. Lactic acid 1.6 Head CT without contrast negative for acute intracranial process. IV Lasix 60 mg x 1 given.  Hospitalist to admit for decompensated CHF.     Assessment and Plan: * Acute on chronic diastolic (congestive) heart failure (HCC) Decompensated CHF, last echo 2022 EF of 50 to 55%, grade 1 DD.  Chest x-ray showing vascular congestion, mild interstitial edema.  BNP elevated at 457. - Lasix 60 mg x 1 given in ED, continue 40 twice daily -Input output, daily weight, daily BMP -Obtain updated echocardiogram Filed Weights   11/11/22 1208 11/12/22 0500  Weight: 127 kg 127.1 kg    Intake/Output  Summary (Last 24 hours) at 11/12/2022 1315 Last data filed at 11/12/2022 1311 Gross per 24 hour  Intake 935.67 ml  Output 2600 ml  Net -1664.33 ml     Hypokalemia Potassium 3.1.  Likely 2/2 diuretics.  Mag 1.7. -Replete K.  - IV mag 2g x 1  Weakness of right lower extremity He is unable to lift his right lower extremity against gravity, but has good strength to left lower extremity- Unknown duration.  Head CT negative for acute abnormality.  Presenting with generalized weakness.  No focal neurologic deficits.  Sensation intact.  On anticoagulation with Eliquis. -MRI brain without contrast pending - PT eval requested   Hepatocellular carcinoma (HCC) Last IR notes 04/2022, technically successful hepatic microwave ablation performed 11/24/2018 and transcatheter bland embolization performed 02/15/2018 for biopsy-proven hepatocellular carcinoma.  Currently under surveillance with yearly abdominal MRI.   Atrial fibrillation (HCC) Total on anticoagulation with Eliquis. -Resumed diltiazem, Metoprolol, Eliquis  Diabetes mellitus (HCC) - SSI- S -Hold home metformin - Hgba1c - 8.0% CBG (last 3)  Recent Labs    11/11/22 2255 11/12/22 1109  GLUCAP 201* 201*   Adding prandial coverage 6/19  Essential hypertension, benign Stable -  resume diltiazem, losartan, metoprolol.  DVT prophylaxis: apixaban Code Status: DNR  Family Communication:  Disposition: Status is: Inpatient  Consultants:   Procedures:   Antimicrobials:     Subjective: Pt without specific complaint  Objective: Vitals:   11/12/22 0000 11/12/22 0338 11/12/22 0500 11/12/22 0738  BP: 125/70  118/74  113/66  Pulse: 100 (!) 101  (!) 109  Resp: 18 18  17   Temp: 98.9 F (37.2 C) 100.3 F (37.9 C)  98 F (36.7 C)  TempSrc: Oral Oral  Oral  SpO2: 94% 93%  92%  Weight:   127.1 kg   Height:        Intake/Output Summary (Last 24 hours) at 11/12/2022 1321 Last data filed at 11/12/2022 1311 Gross per 24 hour   Intake 935.67 ml  Output 2600 ml  Net -1664.33 ml   Filed Weights   11/11/22 1208 11/12/22 0500  Weight: 127 kg 127.1 kg   Examination:  General exam: awake, alert, cooperative, sitting in chair, Appears calm and comfortable  Respiratory system: Clear to auscultation. Respiratory effort normal. Cardiovascular system: normal S1 & S2 heard. No JVD, murmurs, rubs, gallops or clicks. No pedal edema. Gastrointestinal system: Abdomen is nondistended, soft and nontender. No organomegaly or masses felt. Normal bowel sounds heard. Central nervous system: Alert and oriented. No focal neurological deficits. Extremities: Symmetric 5 x 5 power. Skin: No rashes, lesions or ulcers. Psychiatry: Judgement and insight appear normal. Mood & affect appropriate.   Data Reviewed: I have personally reviewed following labs and imaging studies  CBC: Recent Labs  Lab 11/11/22 1210 11/12/22 0416  WBC 7.3 6.8  NEUTROABS 4.7  --   HGB 13.0 13.7  HCT 40.2 43.0  MCV 81.7 81.7  PLT 272 277    Basic Metabolic Panel: Recent Labs  Lab 11/11/22 1210 11/11/22 1411 11/12/22 0416  NA 137  --  138  K 3.1*  --  3.4*  CL 101  --  102  CO2 26  --  24  GLUCOSE 159*  --  115*  BUN 14  --  14  CREATININE 1.09  --  1.17  CALCIUM 8.2*  --  8.1*  MG  --  1.7  --     CBG: Recent Labs  Lab 11/11/22 2255 11/12/22 1109  GLUCAP 201* 201*    No results found for this or any previous visit (from the past 240 hour(s)).   Radiology Studies: DG CHEST PORT 1 VIEW  Result Date: 11/12/2022 CLINICAL DATA:  Pulmonary edema. EXAM: PORTABLE CHEST 1 VIEW COMPARISON:  11/11/2022 FINDINGS: Heart size and mediastinal contours are stable. Aortic atherosclerotic calcifications. Pulmonary vascular congestion without frank pulmonary edema. Stable scar like opacities in the left midlung and right base. No airspace consolidation identified. Remote healed right clavicle fracture. Multiple healed right lateral rib fracture  deformities are also identified. IMPRESSION: 1. Pulmonary vascular congestion without frank pulmonary edema. 2. Stable scar like opacities in the left midlung and right base. Aortic Atherosclerosis (ICD10-I70.0). Electronically Signed   By: Signa Kell M.D.   On: 11/12/2022 12:32   CT Head Wo Contrast  Result Date: 11/11/2022 CLINICAL DATA:  Trauma EXAM: CT HEAD WITHOUT CONTRAST TECHNIQUE: Contiguous axial images were obtained from the base of the skull through the vertex without intravenous contrast. RADIATION DOSE REDUCTION: This exam was performed according to the departmental dose-optimization program which includes automated exposure control, adjustment of the mA and/or kV according to patient size and/or use of iterative reconstruction technique. COMPARISON:  None Available. FINDINGS: Brain: No evidence of acute infarction, hemorrhage, hydrocephalus, extra-axial collection or mass lesion/mass effect. There is mild diffuse atrophy and mild periventricular white matter hypodensity, likely chronic small vessel ischemic change. There is an old lacunar infarct in the right basal ganglia. Vascular: Atherosclerotic calcifications are present within the cavernous  internal carotid arteries. Skull: Normal. Negative for fracture or focal lesion. Sinuses/Orbits: No acute finding. Other: None. IMPRESSION: 1. No acute intracranial process. 2. Mild diffuse atrophy and mild chronic small vessel ischemic change. Electronically Signed   By: Darliss Cheney M.D.   On: 11/11/2022 17:20   DG Chest Port 1 View  Result Date: 11/11/2022 CLINICAL DATA:  Fall, confusion. EXAM: PORTABLE CHEST 1 VIEW COMPARISON:  Chest radiograph 02/10/2022 FINDINGS: The heart is enlarged, unchanged. The upper mediastinal contours are normal. There is vascular congestion with possible mild pulmonary interstitial edema. There is no focal consolidation. There is no significant pleural effusion. There is no pneumothorax A remote right clavicle  fracture is noted. There is no acute osseous abnormality. IMPRESSION: Cardiomegaly with vascular congestion and possible mild pulmonary interstitial edema. Electronically Signed   By: Lesia Hausen M.D.   On: 11/11/2022 13:24    Scheduled Meds:  apixaban  5 mg Oral BID   diltiazem  300 mg Oral QHS   furosemide  40 mg Intravenous BID   insulin aspart  0-5 Units Subcutaneous QHS   insulin aspart  0-9 Units Subcutaneous TID WC   insulin aspart  4 Units Subcutaneous TID WC   insulin glargine-yfgn  12 Units Subcutaneous Daily   metoprolol succinate  50 mg Oral Daily   pregabalin  100 mg Oral BID   Continuous Infusions:   LOS: 1 day   Time spent: 44 mins  Urian Martenson Laural Benes, MD How to contact the [facility] Attending or Consulting provider 7A - 7P or covering provider during after hours 7P -7A, for this patient?  Check the care team in [facility] and look for a) attending/consulting TRH provider listed and b) the [facility] team listed Log into [email or web address] and use [place]'s universal password to access. If you do not have the password, please contact the hospital operator. Locate the [facility] provider you are looking for under [facility] and page to a number that you can be directly reached. If you still have difficulty reaching the provider, please page the [facility] (Director on Call) for the Hospitalists listed on amion for assistance.  11/12/2022, 1:21 PM

## 2022-11-13 ENCOUNTER — Inpatient Hospital Stay (HOSPITAL_COMMUNITY): Payer: Medicare Other

## 2022-11-13 DIAGNOSIS — I4891 Unspecified atrial fibrillation: Secondary | ICD-10-CM | POA: Diagnosis not present

## 2022-11-13 DIAGNOSIS — C220 Liver cell carcinoma: Secondary | ICD-10-CM | POA: Diagnosis not present

## 2022-11-13 DIAGNOSIS — I10 Essential (primary) hypertension: Secondary | ICD-10-CM | POA: Diagnosis not present

## 2022-11-13 DIAGNOSIS — I5033 Acute on chronic diastolic (congestive) heart failure: Secondary | ICD-10-CM | POA: Diagnosis not present

## 2022-11-13 LAB — BASIC METABOLIC PANEL
Anion gap: 13 (ref 5–15)
BUN: 17 mg/dL (ref 8–23)
CO2: 26 mmol/L (ref 22–32)
Calcium: 8.1 mg/dL — ABNORMAL LOW (ref 8.9–10.3)
Chloride: 98 mmol/L (ref 98–111)
Creatinine, Ser: 1.26 mg/dL — ABNORMAL HIGH (ref 0.61–1.24)
GFR, Estimated: 58 mL/min — ABNORMAL LOW (ref >60)
Glucose, Bld: 146 mg/dL — ABNORMAL HIGH (ref 70–99)
Potassium: 3.4 mmol/L — ABNORMAL LOW (ref 3.5–5.1)
Sodium: 137 mmol/L (ref 135–145)

## 2022-11-13 LAB — CBC
HCT: 44.2 % (ref 39.0–52.0)
Hemoglobin: 14 g/dL (ref 13.0–17.0)
MCH: 25.9 pg — ABNORMAL LOW (ref 26.0–34.0)
MCHC: 31.7 g/dL (ref 30.0–36.0)
MCV: 81.7 fL (ref 80.0–100.0)
Platelets: 272 10*3/uL (ref 150–400)
RBC: 5.41 MIL/uL (ref 4.22–5.81)
RDW: 18 % — ABNORMAL HIGH (ref 11.5–15.5)
WBC: 6.9 10*3/uL (ref 4.0–10.5)
nRBC: 0 % (ref 0.0–0.2)

## 2022-11-13 LAB — PROCALCITONIN: Procalcitonin: <0.1 ng/mL

## 2022-11-13 LAB — GLUCOSE, CAPILLARY
Glucose-Capillary: 164 mg/dL — ABNORMAL HIGH (ref 70–99)
Glucose-Capillary: 206 mg/dL — ABNORMAL HIGH (ref 70–99)
Glucose-Capillary: 174 mg/dL — ABNORMAL HIGH (ref 70–99)
Glucose-Capillary: 191 mg/dL — ABNORMAL HIGH (ref 70–99)

## 2022-11-13 LAB — MAGNESIUM: Magnesium: 2 mg/dL (ref 1.7–2.4)

## 2022-11-13 MED ORDER — DOXYCYCLINE HYCLATE 100 MG PO TABS
100.0000 mg | ORAL_TABLET | Freq: Two times a day (BID) | ORAL | Status: DC
  Administered 2022-11-13 – 2022-11-15 (×5): 100 mg via ORAL
  Filled 2022-11-13 (×5): qty 1

## 2022-11-13 MED ORDER — INSULIN GLARGINE-YFGN 100 UNIT/ML ~~LOC~~ SOLN
15.0000 [IU] | Freq: Every day | SUBCUTANEOUS | Status: DC
  Administered 2022-11-14 – 2022-11-15 (×2): 15 [IU] via SUBCUTANEOUS
  Filled 2022-11-13 (×3): qty 0.15

## 2022-11-13 MED ORDER — DILTIAZEM HCL ER COATED BEADS 180 MG PO CP24
300.0000 mg | ORAL_CAPSULE | Freq: Every day | ORAL | Status: DC
  Administered 2022-11-13 – 2022-11-15 (×3): 300 mg via ORAL
  Filled 2022-11-13 (×3): qty 1

## 2022-11-13 MED ORDER — GUAIFENESIN ER 600 MG PO TB12
1200.0000 mg | ORAL_TABLET | Freq: Two times a day (BID) | ORAL | Status: DC
  Administered 2022-11-13 – 2022-11-15 (×5): 1200 mg via ORAL
  Filled 2022-11-13 (×5): qty 2

## 2022-11-13 MED ORDER — IPRATROPIUM-ALBUTEROL 0.5-2.5 (3) MG/3ML IN SOLN: 3.0000 mL | Freq: Four times a day (QID) | RESPIRATORY_TRACT | Status: DC

## 2022-11-13 MED ORDER — METOPROLOL TARTRATE 5 MG/5ML IV SOLN
5.0000 mg | Freq: Once | INTRAVENOUS | Status: AC
  Administered 2022-11-13: 5 mg via INTRAVENOUS
  Filled 2022-11-13: qty 5

## 2022-11-13 MED ORDER — INSULIN ASPART 100 UNIT/ML IJ SOLN
5.0000 [IU] | Freq: Three times a day (TID) | INTRAMUSCULAR | Status: DC
  Administered 2022-11-13 – 2022-11-14 (×3): 5 [IU] via SUBCUTANEOUS

## 2022-11-13 MED ORDER — POTASSIUM CHLORIDE CRYS ER 20 MEQ PO TBCR
40.0000 meq | EXTENDED_RELEASE_TABLET | Freq: Once | ORAL | Status: AC
  Administered 2022-11-13: 40 meq via ORAL
  Filled 2022-11-13: qty 2

## 2022-11-13 MED ORDER — METOPROLOL TARTRATE 5 MG/5ML IV SOLN
2.5000 mg | Freq: Once | INTRAVENOUS | Status: AC
  Administered 2022-11-13: 2.5 mg via INTRAVENOUS

## 2022-11-13 MED ORDER — IPRATROPIUM-ALBUTEROL 0.5-2.5 (3) MG/3ML IN SOLN
3.0000 mL | Freq: Three times a day (TID) | RESPIRATORY_TRACT | Status: DC
  Administered 2022-11-13 – 2022-11-15 (×6): 3 mL via RESPIRATORY_TRACT
  Filled 2022-11-13 (×6): qty 3

## 2022-11-13 MED ORDER — FUROSEMIDE 40 MG PO TABS: 40.0000 mg | ORAL_TABLET | Freq: Two times a day (BID) | ORAL | Status: DC

## 2022-11-13 MED ORDER — METOPROLOL TARTRATE 5 MG/5ML IV SOLN
INTRAVENOUS | Status: AC
  Filled 2022-11-13: qty 5

## 2022-11-13 MED ORDER — DEXTROMETHORPHAN POLISTIREX ER 30 MG/5ML PO SUER
30.0000 mg | Freq: Two times a day (BID) | ORAL | Status: DC | PRN
  Administered 2022-11-13: 30 mg via ORAL
  Filled 2022-11-13: qty 5

## 2022-11-13 NOTE — Progress Notes (Signed)
Spoke with patients Daughter in law/ POA she states patient does not live alone, he lives with his grandson and grandsons wife and kids.

## 2022-11-13 NOTE — TOC Progression Note (Signed)
Transition of Care [facility]) - Progression Note    Patient Details  Name: Steve Jensen MRN: [id number] Date of Birth: [birth year]  Transition [facility]) CM/SW Contact  Elliot Gault, LCSW Phone Number: 11/13/2022, 3:44 PM  Clinical Narrative:     TOC following. MD completed Peer to Peer review with insurance MD and pt is approved for SNF rehab. MD anticipating dc tomorrow.  TOC spoke with pt's DIL today to discuss dc planning. DIL states that pt lives with his grandson, grandson's wife and their two children. DIL and pt's son live right next door. DIL is at the pt's home daily to provide childcare and assist pt when needed. She states that up until Monday of this week, pt was ambulating with a cane and was independent in ADLs. DIL states that she cannot manage pt on her own if he is not at that functional level and she is in agreement with SNF rehab at dc. Family plan to come see pt tonight to encourage him to choose SNF rehab at dc.  Awaiting decision from pt's first choice SNF. Provided DIL with update on other options for pt to select from if [facility] unable to offer. She and family will review with pt tonight.  TOC will follow.  Expected Discharge Plan: Skilled Nursing Facility Barriers to Discharge: Continued Medical Work up  Expected Discharge Plan and Services In-house Referral: Clinical Social Work     Living arrangements for the past 2 months: Single Family Home                                       Social Determinants of Health (SDOH) Interventions SDOH Screenings   Food Insecurity: No Food Insecurity (11/11/2022)  Housing: Patient Declined (11/11/2022)  Transportation Needs: No Transportation Needs (11/11/2022)  Utilities: Not At Risk (11/11/2022)  Tobacco Use: Medium Risk (03/11/2022)    Readmission Risk Interventions    12/26/2021   12:51 PM 12/25/2021    3:31 PM 12/19/2021   12:39 PM  Readmission Risk Prevention Plan  Transportation Screening  Complete  Complete  PCP or Specialist Appt within 5-7 Days Complete    Home Care Screening  Complete   Medication Review (RN CM)  Complete   HRI or Home Care Consult   Complete  Social Work Consult for Recovery Care Planning/Counseling   Complete  Palliative Care Screening   Not Applicable  Medication Review Oceanographer)   Complete

## 2022-11-13 NOTE — Progress Notes (Signed)
   11/13/22 0500  Vitals  Temp (!) 101.6 F (38.7 C)  Temp Source Oral  BP (!) 145/88  MAP (mmHg) 105  BP Location Right Arm  BP Method Automatic  Patient Position (if appropriate) Lying  Pulse Rate (!) 112  Pulse Rate Source Dinamap  MEWS COLOR  MEWS Score Color Red  Oxygen Therapy  SpO2 92 %  O2 Device Room Air  Height and Weight  Weight 123.8 kg  Type of Scale Used Bed  Type of Weight Stated  BMI (Calculated) 38.08  MEWS Score  MEWS Temp 2  MEWS Systolic 0  MEWS Pulse 2  MEWS RR 0  MEWS LOC 0  MEWS Score 4  Provider Notification  Provider Name/Title Dr. Carren Rang  Date Provider Notified 11/13/22  Time Provider Notified [phone]  Method of Notification Page  Notification Reason Other (Comment) (PRN tylenol given)  Provider response No new orders  Date of Provider Response 11/13/22  Time of Provider Response 0506   PRN tylenol given for fever

## 2022-11-13 NOTE — Progress Notes (Signed)
   11/13/22 0606  Vitals  Temp 99.7 F (37.6 C)  Temp Source Oral  BP (!) 162/90  MAP (mmHg) 109  BP Location Left Arm  BP Method Automatic  Patient Position (if appropriate) Lying  Pulse Rate (!) 124  Pulse Rate Source Dinamap  Resp 18  Level of Consciousness  Level of Consciousness Alert  MEWS COLOR  MEWS Score Color Yellow  Oxygen Therapy  SpO2 93 %  O2 Device Nasal Cannula  O2 Flow Rate (L/min) 2 L/min  MEWS Score  MEWS Temp 0  MEWS Systolic 0  MEWS Pulse 2  MEWS RR 0  MEWS LOC 0  MEWS Score 2   Patient now a yellow MEWS, Dr. Carren Rang notified of patients pulse rate, one time order given for metoprolol push

## 2022-11-13 NOTE — Progress Notes (Signed)
PROGRESS NOTE   Steve Jensen  WUJ:[id number] DOB: Mar 10, 1941 DOA: 11/11/2022 PCP: Lupita Raider, MD   Chief Complaint  Patient presents with   Atrial Fibrillation   Level of care: Telemetry  Brief Admission History:  82 y.o. male with medical history significant for diastolic CHF, atrial fibrillation, hypertension, OSA, diabetes mellitus, liver cancer.  Patient was brought to the ED with reports of confusion, generalized weakness, feeling poorly today.  Patient did not exactly fall today, he was sitting halfway off his bed and with family present trying to assist him, he slid down to the floor on to his buttock.  Patient's daughter-in-law is at bedside, she reports patient has intermittent episodes of confusion, which is normal for him.  His mental status is currently unchanged from his baseline.  On my evaluation he is not confused.  He has had bilateral lower extremity swelling.  Baseline over the past 2 weeks.  Reports difficulty breathing for the past 3 to 4 weeks, cough.  No chest pain.  He is on Lasix 40 mg daily and compliant.  Does not consistently check his weight.   ED Course: Tmax 99.  Heart rate 97-103.  Respiratory rate 16-25.  O2 sats 91 to 94% on room air.  Chest x-ray showing vascular congestion with mild pulm interstitial edema. BNP elevated at 457. Troponin 9 > 10. Lactic acid 1.6 Head CT without contrast negative for acute intracranial process. IV Lasix 60 mg x 1 given.  Hospitalist to admit for decompensated CHF.     Assessment and Plan: * Acute on chronic diastolic (congestive) heart failure (HCC) Decompensated CHF, last echo 2022 EF of 50 to 55%, grade 1 DD.  Chest x-ray showing vascular congestion, mild interstitial edema.  BNP elevated at 457. - Lasix 60 mg x 1 given in ED, IV lasix given, DC IV lasix today with bump in Cr, resume oral lasix 40 twice daily starting 11/14/22.  -Input output, daily weight, daily BMP -Obtain updated echocardiogram Filed Weights    11/11/22 1208 11/12/22 0500 11/13/22 0500  Weight: 127 kg 127.1 kg 123.8 kg    Intake/Output Summary (Last 24 hours) at 11/13/2022 1326 Last data filed at 11/13/2022 0700 Gross per 24 hour  Intake 240 ml  Output --  Net 240 ml     Hypokalemia Potassium 3.4.   2/2 diuretics.  -Replete K further on 6/20 - IV mag 2g x 1 given on 6/19 and repleted  Weakness of right lower extremity He is unable to lift his right lower extremity against gravity, but has good strength to left lower extremity- Unknown duration.  Head CT negative for acute abnormality.  Presenting with generalized weakness.  No focal neurologic deficits.  Sensation intact.  On anticoagulation with Eliquis. -MRI brain without contrast NEGATIVE FOR ACUTE FINDINGS - PT eval requested and recommending SNF   Hepatocellular carcinoma (HCC) Last IR notes 04/2022, technically successful hepatic microwave ablation performed 11/24/2018 and transcatheter bland embolization performed 02/15/2018 for biopsy-proven hepatocellular carcinoma.  Currently under surveillance with yearly abdominal MRI.   Atrial fibrillation (HCC) Full anticoagulation with apixaban -Resumed diltiazem, Metoprolol, Eliquis  Obstructive sleep apnea --will offer CPAP in hospital   Uncontrolled type 2 Diabetes mellitus with cardiac complications - SSI- S -Hold home metformin - Hgba1c - 8.0% CBG (last 3)  Recent Labs    11/12/22 2136 11/13/22 0716 11/13/22 1059  GLUCAP 194* 164* 206*   Added prandial coverage 6/19, increased dose to 5 units TID with meals on 6/20  Essential  hypertension, benign Stable -  resume diltiazem, losartan, metoprolol.  DVT prophylaxis: apixaban Code Status: DNR  Family Communication:  Disposition: Status is: Inpatient  Consultants:   Procedures:   Antimicrobials:     Subjective: Pt says from his standpoint he is feeling well.  He had a fever early this morning but seems resolved now.  He is having some chest  congestion but nonproductive with coughing.  No dysuria.   Objective: Vitals:   11/13/22 0500 11/13/22 0606 11/13/22 0655 11/13/22 1122  BP: (!) 145/88 (!) 162/90 (!) 139/104 102/82  Pulse: (!) 112 (!) 124 (!) 118 97  Resp:  18    Temp: (!) 101.6 F (38.7 C) 99.7 F (37.6 C)  99.1 F (37.3 C)  TempSrc: Oral Oral  Oral  SpO2: 92% 93% 94% 94%  Weight: 123.8 kg     Height:        Intake/Output Summary (Last 24 hours) at 11/13/2022 1331 Last data filed at 11/13/2022 0700 Gross per 24 hour  Intake 240 ml  Output --  Net 240 ml   Filed Weights   11/11/22 1208 11/12/22 0500 11/13/22 0500  Weight: 127 kg 127.1 kg 123.8 kg   Examination:  General exam: awake, alert, cooperative, sitting in chair, Appears calm and comfortable  Respiratory system: expiratory wheezing heard, upper airway congestion noises. Cardiovascular system: normal S1 & S2 heard. No JVD, murmurs, rubs, gallops or clicks. No pedal edema. Gastrointestinal system: Abdomen is nondistended, soft and nontender. No organomegaly or masses felt. Normal bowel sounds heard. Central nervous system: Alert and oriented. No focal neurological deficits. Extremities: Symmetric 5 x 5 power. Skin: No rashes, lesions or ulcers. Psychiatry: Judgement and insight appear normal. Mood & affect appropriate.   Data Reviewed: I have personally reviewed following labs and imaging studies  CBC: Recent Labs  Lab 11/11/22 1210 11/12/22 0416 11/13/22 0442  WBC 7.3 6.8 6.9  NEUTROABS 4.7  --   --   HGB 13.0 13.7 14.0  HCT 40.2 43.0 44.2  MCV 81.7 81.7 81.7  PLT 272 277 272    Basic Metabolic Panel: Recent Labs  Lab 11/11/22 1210 11/11/22 1411 11/12/22 0416 11/13/22 0442  NA 137  --  138 137  K 3.1*  --  3.4* 3.4*  CL 101  --  102 98  CO2 26  --  24 26  GLUCOSE 159*  --  115* 146*  BUN 14  --  14 17  CREATININE 1.09  --  1.17 1.26*  CALCIUM 8.2*  --  8.1* 8.1*  MG  --  1.7  --  2.0    CBG: Recent Labs  Lab  11/12/22 1109 11/12/22 1620 11/12/22 2136 11/13/22 0716 11/13/22 1059  GLUCAP 201* 149* 194* 164* 206*    No results found for this or any previous visit (from the past 240 hour(s)).   Radiology Studies: ECHOCARDIOGRAM COMPLETE  Result Date: 11/12/2022    ECHOCARDIOGRAM REPORT   Patient Name:   Steve LUONG [facility] Date of Exam: 11/12/2022 Medical Rec #:  [id number]      Height:       71.0 in Accession #:    [id number]     Weight:       280.2 lb Date of Birth:  [birth year]     BSA:          2.433 m Patient Age:    81 years       BP:  113/66 mmHg Patient Gender: M              HR:           109 bpm. [facility] Procedure: 2D Echo, Cardiac Doppler and Color Doppler Indications:    CHF I50.9  History:        Patient has prior history of Echocardiogram examinations, most                 recent 02/21/2021. Arrythmias:Atrial Fibrillation; Risk                 Factors:Hypertension, Diabetes, Dyslipidemia and Former Smoker.  Sonographer:    Celesta Gentile RCS Referring Phys: [phone] Heloise Beecham EMOKPAE IMPRESSIONS  1. Left ventricular ejection fraction, by estimation, is 55 to 60%. The left ventricle has normal function. The left ventricle has no regional wall motion abnormalities. There is mild concentric left ventricular hypertrophy. Left ventricular diastolic parameters are indeterminate.  2. Right ventricular systolic function is low normal. The right ventricular size is normal. There is normal pulmonary artery systolic pressure. The estimated right ventricular systolic pressure is 24.7 mmHg.  3. Left atrial size was moderately dilated.  4. The mitral valve is degenerative. Mild mitral valve regurgitation.  5. The aortic valve is tricuspid. Aortic valve regurgitation is mild. Aortic valve sclerosis/calcification is present, without any evidence of aortic stenosis.  6. The inferior vena cava is normal in size with greater than 50% respiratory variability, suggesting right atrial pressure of 3  mmHg. Comparison(s): Prior images reviewed side by side. LVEF remains normal range at 55-60%. FINDINGS  Left Ventricle: Left ventricular ejection fraction, by estimation, is 55 to 60%. The left ventricle has normal function. The left ventricle has no regional wall motion abnormalities. The left ventricular internal cavity size was normal in size. There is  mild concentric left ventricular hypertrophy. Left ventricular diastolic function could not be evaluated due to atrial fibrillation. Left ventricular diastolic parameters are indeterminate. Right Ventricle: The right ventricular size is normal. No increase in right ventricular wall thickness. Right ventricular systolic function is low normal. There is normal pulmonary artery systolic pressure. The tricuspid regurgitant velocity is 2.33 m/s,  and with an assumed right atrial pressure of 3 mmHg, the estimated right ventricular systolic pressure is 24.7 mmHg. Left Atrium: Left atrial size was moderately dilated. Right Atrium: Right atrial size was normal in size. Pericardium: There is no evidence of pericardial effusion. Mitral Valve: The mitral valve is degenerative in appearance. There is mild calcification of the mitral valve leaflet(s). Mild mitral annular calcification. Mild mitral valve regurgitation. Tricuspid Valve: The tricuspid valve is grossly normal. Tricuspid valve regurgitation is trivial. Aortic Valve: The aortic valve is tricuspid. Aortic valve regurgitation is mild. Aortic valve sclerosis/calcification is present, without any evidence of aortic stenosis. Pulmonic Valve: The pulmonic valve was grossly normal. Pulmonic valve regurgitation is trivial. Aorta: The aortic root is normal in size and structure. Venous: The inferior vena cava is normal in size with greater than 50% respiratory variability, suggesting right atrial pressure of 3 mmHg. IAS/Shunts: No atrial level shunt detected by color flow Doppler.  LEFT VENTRICLE PLAX 2D LVIDd:         5.00  cm LVIDs:         3.40 cm LV PW:         1.10 cm LV IVS:        1.00 cm LVOT diam:     2.10 cm LV SV:  63 LV SV Index:   26 LVOT Area:     3.46 cm  RIGHT VENTRICLE TAPSE (M-mode): 1.8 cm LEFT ATRIUM             Index        RIGHT ATRIUM           Index LA diam:        4.60 cm 1.89 cm/m   RA Area:     18.50 cm LA Vol (A2C):   73.2 ml 30.08 ml/m  RA Volume:   55.30 ml  22.72 ml/m LA Vol (A4C):   99.0 ml 40.68 ml/m LA Biplane Vol: 92.9 ml 38.18 ml/m  AORTIC VALVE LVOT Vmax:   104.50 cm/s LVOT Vmean:  69.600 cm/s LVOT VTI:    0.182 m  AORTA Ao Root diam: 3.80 cm MITRAL VALVE                TRICUSPID VALVE MV Area (PHT): 3.85 cm     TR Peak grad:   21.7 mmHg MV Decel Time: 197 msec     TR Vmax:        233.00 cm/s MV E velocity: 111.00 cm/s                             SHUNTS                             Systemic VTI:  0.18 m                             Systemic Diam: 2.10 cm Nona Dell MD Electronically signed by Nona Dell MD Signature Date/Time: 11/12/2022/4:19:23 PM    Final    MR BRAIN WO CONTRAST  Result Date: 11/12/2022 CLINICAL DATA:  Right lower extremity weakness. EXAM: MRI HEAD WITHOUT CONTRAST TECHNIQUE: Multiplanar, multiecho pulse sequences of the brain and surrounding structures were obtained without intravenous contrast. COMPARISON:  CT head June 18, 24. FINDINGS: Motion limited study. Brain: No acute infarction, hemorrhage, hydrocephalus, extra-axial collection or mass lesion. Scattered T2/FLAIR hyperintensities in the white matter, compatible with chronic microvascular ischemic disease. Cerebral atrophy. Vascular: Major arterial flow voids are maintained at the skull base. Skull and upper cervical spine: Normal marrow signal. Sinuses/Orbits: Clear sinuses.  No acute orbital findings. Other: No mastoid effusions. IMPRESSION: No evidence of acute intracranial abnormality. Motion limited study. Electronically Signed   By: Feliberto Harts M.D.   On: 11/12/2022 14:02   DG CHEST  PORT 1 VIEW  Result Date: 11/12/2022 CLINICAL DATA:  Pulmonary edema. EXAM: PORTABLE CHEST 1 VIEW COMPARISON:  11/11/2022 FINDINGS: Heart size and mediastinal contours are stable. Aortic atherosclerotic calcifications. Pulmonary vascular congestion without frank pulmonary edema. Stable scar like opacities in the left midlung and right base. No airspace consolidation identified. Remote healed right clavicle fracture. Multiple healed right lateral rib fracture deformities are also identified. IMPRESSION: 1. Pulmonary vascular congestion without frank pulmonary edema. 2. Stable scar like opacities in the left midlung and right base. Aortic Atherosclerosis (ICD10-I70.0). Electronically Signed   By: Signa Kell M.D.   On: 11/12/2022 12:32   CT Head Wo Contrast  Result Date: 11/11/2022 CLINICAL DATA:  Trauma EXAM: CT HEAD WITHOUT CONTRAST TECHNIQUE: Contiguous axial images were obtained from the base of the skull through the vertex without intravenous contrast. RADIATION DOSE REDUCTION: This exam was performed  according to the departmental dose-optimization program which includes automated exposure control, adjustment of the mA and/or kV according to patient size and/or use of iterative reconstruction technique. COMPARISON:  None Available. FINDINGS: Brain: No evidence of acute infarction, hemorrhage, hydrocephalus, extra-axial collection or mass lesion/mass effect. There is mild diffuse atrophy and mild periventricular white matter hypodensity, likely chronic small vessel ischemic change. There is an old lacunar infarct in the right basal ganglia. Vascular: Atherosclerotic calcifications are present within the cavernous internal carotid arteries. Skull: Normal. Negative for fracture or focal lesion. Sinuses/Orbits: No acute finding. Other: None. IMPRESSION: 1. No acute intracranial process. 2. Mild diffuse atrophy and mild chronic small vessel ischemic change. Electronically Signed   By: Darliss Cheney M.D.   On:  11/11/2022 17:20    Scheduled Meds:  apixaban  5 mg Oral BID   diltiazem  300 mg Oral QHS   doxycycline  100 mg Oral Q12H   [START ON 11/14/2022] furosemide  40 mg Oral BID   guaiFENesin  1,200 mg Oral BID   insulin aspart  0-5 Units Subcutaneous QHS   insulin aspart  0-9 Units Subcutaneous TID WC   insulin aspart  5 Units Subcutaneous TID WC   [START ON 11/14/2022] insulin glargine-yfgn  15 Units Subcutaneous Daily   ipratropium-albuterol  3 mL Nebulization TID   metoprolol succinate  50 mg Oral Daily   metoprolol tartrate       pregabalin  100 mg Oral BID   Continuous Infusions:   LOS: 2 days   Time spent: 55 mins  Nelani Schmelzle Laural Benes, MD How to contact the [facility] Attending or Consulting provider 7A - 7P or covering provider during after hours 7P -7A, for this patient?  Check the care team in [facility] and look for a) attending/consulting TRH provider listed and b) the [facility] team listed Log into [email or web address] and use [place]'s universal password to access. If you do not have the password, please contact the hospital operator. Locate the [facility] provider you are looking for under [facility] and page to a number that you can be directly reached. If you still have difficulty reaching the provider, please page the [facility] (Director on Call) for the Hospitalists listed on amion for assistance.  11/13/2022, 1:31 PM

## 2022-11-13 NOTE — Assessment & Plan Note (Addendum)
--  continue nightly CPAP, pt prefers his home device

## 2022-11-14 DIAGNOSIS — E876 Hypokalemia: Secondary | ICD-10-CM | POA: Diagnosis not present

## 2022-11-14 DIAGNOSIS — I4891 Unspecified atrial fibrillation: Secondary | ICD-10-CM | POA: Diagnosis not present

## 2022-11-14 DIAGNOSIS — I5033 Acute on chronic diastolic (congestive) heart failure: Secondary | ICD-10-CM | POA: Diagnosis not present

## 2022-11-14 DIAGNOSIS — I10 Essential (primary) hypertension: Secondary | ICD-10-CM | POA: Diagnosis not present

## 2022-11-14 LAB — GLUCOSE, CAPILLARY
Glucose-Capillary: 141 mg/dL — ABNORMAL HIGH (ref 70–99)
Glucose-Capillary: 256 mg/dL — ABNORMAL HIGH (ref 70–99)
Glucose-Capillary: 218 mg/dL — ABNORMAL HIGH (ref 70–99)
Glucose-Capillary: 203 mg/dL — ABNORMAL HIGH (ref 70–99)

## 2022-11-14 LAB — BASIC METABOLIC PANEL
Anion gap: 13 (ref 5–15)
BUN: 25 mg/dL — ABNORMAL HIGH (ref 8–23)
CO2: 23 mmol/L (ref 22–32)
Calcium: 8.2 mg/dL — ABNORMAL LOW (ref 8.9–10.3)
Chloride: 100 mmol/L (ref 98–111)
Creatinine, Ser: 1.29 mg/dL — ABNORMAL HIGH (ref 0.61–1.24)
GFR, Estimated: 56 mL/min — ABNORMAL LOW (ref >60)
Glucose, Bld: 146 mg/dL — ABNORMAL HIGH (ref 70–99)
Potassium: 2.8 mmol/L — ABNORMAL LOW (ref 3.5–5.1)
Sodium: 136 mmol/L (ref 135–145)

## 2022-11-14 LAB — PROCALCITONIN: Procalcitonin: <0.1 ng/mL

## 2022-11-14 LAB — MAGNESIUM: Magnesium: 2 mg/dL (ref 1.7–2.4)

## 2022-11-14 MED ORDER — PHENOL 1.4 % MT LIQD
1.0000 | OROMUCOSAL | Status: DC | PRN
  Administered 2022-11-14: 1 via OROMUCOSAL
  Filled 2022-11-14: qty 177

## 2022-11-14 MED ORDER — POTASSIUM CHLORIDE CRYS ER 20 MEQ PO TBCR
40.0000 meq | EXTENDED_RELEASE_TABLET | Freq: Once | ORAL | Status: AC
  Administered 2022-11-14: 40 meq via ORAL
  Filled 2022-11-14: qty 2

## 2022-11-14 MED ORDER — ACETAMINOPHEN 325 MG PO TABS
650.0000 mg | ORAL_TABLET | Freq: Four times a day (QID) | ORAL | Status: DC
  Administered 2022-11-14 – 2022-11-15 (×3): 650 mg via ORAL
  Filled 2022-11-14 (×4): qty 2

## 2022-11-14 MED ORDER — POTASSIUM CHLORIDE CRYS ER 20 MEQ PO TBCR: 40.0000 meq | EXTENDED_RELEASE_TABLET | Freq: Once | ORAL | Status: DC

## 2022-11-14 MED ORDER — ACETAMINOPHEN 650 MG RE SUPP: 650.0000 mg | Freq: Four times a day (QID) | RECTAL | Status: DC

## 2022-11-14 MED ORDER — INSULIN ASPART 100 UNIT/ML IJ SOLN
6.0000 [IU] | Freq: Three times a day (TID) | INTRAMUSCULAR | Status: DC
  Administered 2022-11-14 – 2022-11-15 (×3): 6 [IU] via SUBCUTANEOUS

## 2022-11-14 MED ORDER — FUROSEMIDE 40 MG PO TABS
40.0000 mg | ORAL_TABLET | Freq: Two times a day (BID) | ORAL | Status: DC
  Administered 2022-11-15: 40 mg via ORAL
  Filled 2022-11-14: qty 1

## 2022-11-14 MED ORDER — METOPROLOL SUCCINATE ER 50 MG PO TB24
75.0000 mg | ORAL_TABLET | Freq: Every day | ORAL | Status: DC
  Administered 2022-11-14 – 2022-11-15 (×2): 75 mg via ORAL
  Filled 2022-11-14 (×2): qty 1

## 2022-11-14 MED ORDER — POTASSIUM CHLORIDE CRYS ER 20 MEQ PO TBCR
60.0000 meq | EXTENDED_RELEASE_TABLET | Freq: Once | ORAL | Status: AC
  Administered 2022-11-14: 60 meq via ORAL
  Filled 2022-11-14: qty 3

## 2022-11-14 MED ORDER — LOPERAMIDE HCL 2 MG PO CAPS
2.0000 mg | ORAL_CAPSULE | ORAL | Status: DC | PRN
  Administered 2022-11-14: 2 mg via ORAL
  Filled 2022-11-14 (×2): qty 1

## 2022-11-14 NOTE — Progress Notes (Signed)
   11/14/22 0800  ReDS Vest / Clip  Station Marker D  Ruler Value 39  ReDS Value Range < 36  ReDS Actual Value 32

## 2022-11-14 NOTE — Care Management Important Message (Signed)
Important Message  Patient Details  Name: Steve Jensen MRN: [id number] Date of Birth: [birth year]   Medicare Important Message Given:  Yes (spoke with relative [place] at [phone] to review letter, no additonal copy needed)     Corey Harold 11/14/2022, 3:54 PM

## 2022-11-14 NOTE — TOC Progression Note (Addendum)
Transition of Care [facility]) - Progression Note    Patient Details  Name: ENNIO HOUP MRN: [id number] Date of Birth: [birth year]  [facility] CM/SW Contact  Karn Cassis, [place] Phone Number: 11/14/2022, 8:38 AM  Clinical Narrative: Pt accepts bed at [facility]. SNF admissions notified.   Update: Per MD, anticipate d/c tomorrow. Meryle Ready at [facility] aware and needs d/c summary by 3:30. Will need to be faxed to [phone].       Expected Discharge Plan: Skilled Nursing Facility Barriers to Discharge: Continued Medical Work up  Expected Discharge Plan and Services In-house Referral: Clinical Social Work     Living arrangements for the past 2 months: Single Family Home                                       Social Determinants of Health (SDOH) Interventions SDOH Screenings   Food Insecurity: No Food Insecurity (11/11/2022)  Housing: Patient Declined (11/11/2022)  Transportation Needs: No Transportation Needs (11/11/2022)  Utilities: Not At Risk (11/11/2022)  Tobacco Use: Medium Risk (03/11/2022)    Readmission Risk Interventions    12/26/2021   12:51 PM 12/25/2021    3:31 PM 12/19/2021   12:39 PM  Readmission Risk Prevention Plan  Transportation Screening  Complete Complete  PCP or Specialist Appt within 5-7 Days Complete    Home Care Screening  Complete   Medication Review (RN CM)  Complete   HRI or Home Care Consult   Complete  Social Work Consult for Recovery Care Planning/Counseling   Complete  Palliative Care Screening   Not Applicable  Medication Review Oceanographer)   Complete

## 2022-11-14 NOTE — Progress Notes (Signed)
Patient alert and verbal, transferred from bed to chair with assistance. Patient repositioned in chair. Patient complained of sore throat, new order placed for chloraseptic spray. At this time patient does not want to be transferred back to bed. Will report to on-coming nurse.

## 2022-11-14 NOTE — Progress Notes (Signed)
PROGRESS NOTE   Steve Jensen  NFA:[id number] DOB: 1940-08-26 DOA: 11/11/2022 PCP: Lupita Raider, MD   Chief Complaint  Patient presents with   Atrial Fibrillation   Level of care: Telemetry  Brief Admission History:  82 y.o. male with medical history significant for diastolic CHF, atrial fibrillation, hypertension, OSA, diabetes mellitus, liver cancer.  Patient was brought to the ED with reports of confusion, generalized weakness, feeling poorly today.  Patient did not exactly fall today, he was sitting halfway off his bed and with family present trying to assist him, he slid down to the floor on to his buttock.  Patient's daughter-in-law is at bedside, she reports patient has intermittent episodes of confusion, which is normal for him.  His mental status is currently unchanged from his baseline.  On my evaluation he is not confused.  He has had bilateral lower extremity swelling.  Baseline over the past 2 weeks.  Reports difficulty breathing for the past 3 to 4 weeks, cough.  No chest pain.  He is on Lasix 40 mg daily and compliant.  Does not consistently check his weight.   ED Course: Tmax 99.  Heart rate 97-103.  Respiratory rate 16-25.  O2 sats 91 to 94% on room air.  Chest x-ray showing vascular congestion with mild pulm interstitial edema. BNP elevated at 457. Troponin 9 > 10. Lactic acid 1.6 Head CT without contrast negative for acute intracranial process. IV Lasix 60 mg x 1 given.  Hospitalist to admit for decompensated CHF.     Assessment and Plan: * Acute on chronic diastolic (congestive) heart failure (HCC) Decompensated CHF, last echo 2022 EF of 50 to 55%, grade 1 DD.  Chest x-ray showing vascular congestion, mild interstitial edema.  BNP elevated at 457. - Lasix 60 mg x 1 given in ED, IV lasix given, DC IV lasix today with bump in Cr, resume oral lasix 40 twice daily starting 11/15/22. Diuretic holiday today given bump in Cr.  RedsVest reading 6/21 is 32 and reassuring   -Input output, daily weight, daily BMP -Obtain updated echocardiogram Filed Weights   11/12/22 0500 11/13/22 0500 11/14/22 0506  Weight: 127.1 kg 123.8 kg 122.7 kg    Intake/Output Summary (Last 24 hours) at 11/14/2022 1139 Last data filed at 11/14/2022 0854 Gross per 24 hour  Intake 720 ml  Output 450 ml  Net 270 ml     Hypokalemia Potassium 3.4.   2/2 diuretics.  -Replete K further on 6/20 - IV mag 2g x 1 given on 6/19 and repleted  Weakness of right lower extremity He is unable to lift his right lower extremity against gravity, but has good strength to left lower extremity- Unknown duration.  Head CT negative for acute abnormality.  Presenting with generalized weakness.  No focal neurologic deficits.  Sensation intact.  On anticoagulation with Eliquis. -MRI brain without contrast NEGATIVE FOR ACUTE FINDINGS - PT eval requested and recommending SNF   Hepatocellular carcinoma (HCC) Last IR notes 04/2022, technically successful hepatic microwave ablation performed 11/24/2018 and transcatheter bland embolization performed 02/15/2018 for biopsy-proven hepatocellular carcinoma.  Currently under surveillance with yearly abdominal MRI.   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  DVT prophylaxis: apixaban Code Status: DNR  Family Communication:  Disposition: Status is: Inpatient  Consultants:   Procedures:    Antimicrobials:     Subjective: Pt had another fever.  He says he also having loose stools.  No SOB.  Cough is some better today.    Objective: Vitals:   11/14/22 0506 11/14/22 0820 11/14/22 0832 11/14/22 1001  BP: 129/74 120/60  125/89  Pulse: (!) 115 (!) 114  (!) 101  Resp:    19  Temp: 99.6 F (37.6 C)   98.6 F (37 C)  TempSrc: Oral   Oral  SpO2: 93%  91% 93%  Weight: 122.7 kg     Height:        Intake/Output Summary (Last 24 hours) at 11/14/2022 1207 Last data filed at 11/14/2022 0854 Gross per 24 hour  Intake 720 ml  Output 450 ml  Net 270 ml   Filed Weights   11/12/22 0500 11/13/22 0500 11/14/22 0506  Weight: 127.1 kg 123.8 kg 122.7 kg   Examination:  General exam: awake, alert, cooperative, sitting in chair, Appears calm and comfortable  Respiratory system: expiratory wheezing heard, upper airway congestion noises. Cardiovascular system: normal S1 & S2 heard. No JVD, murmurs, rubs, gallops or clicks. No pedal edema. Gastrointestinal system: Abdomen is nondistended, soft and nontender. No organomegaly or masses felt. Normal bowel sounds heard. Central nervous system: Alert and oriented. No focal neurological deficits. Extremities: Symmetric 5 x 5 power. Skin: No rashes, lesions or ulcers. Psychiatry: Judgement and insight appear normal. Mood & affect appropriate.   Data Reviewed: I have personally reviewed following labs and imaging studies  CBC: Recent Labs  Lab 11/11/22 1210 11/12/22 0416 11/13/22 0442  WBC 7.3 6.8 6.9  NEUTROABS 4.7  --   --   HGB 13.0 13.7 14.0  HCT 40.2 43.0 44.2  MCV 81.7 81.7 81.7  PLT 272 277 272    Basic Metabolic Panel: Recent Labs  Lab 11/11/22 1210 11/11/22 1411 11/12/22 0416 11/13/22 0442 11/14/22 0359  NA 137  --  138 137 136  K 3.1*  --  3.4* 3.4* 2.8*  CL 101  --  102 98 100  CO2 26  --  24 26 23   GLUCOSE 159*  --  115* 146* 146*  BUN 14  --  14 17 25*  CREATININE 1.09  --  1.17 1.26* 1.29*  CALCIUM  8.2*  --  8.1* 8.1* 8.2*  MG  --  1.7  --  2.0 2.0    CBG: Recent Labs  Lab 11/13/22 1059 11/13/22 1624 11/13/22 2047 11/14/22 0728 11/14/22 1056  GLUCAP 206* 174* 191* 141* 256*    No results found for this or any previous visit (from the past 240 hour(s)).   Radiology Studies: DG CHEST PORT 1 VIEW  Result Date: 11/13/2022 CLINICAL DATA:  Shortness of breath and wheezing. EXAM: PORTABLE CHEST 1 VIEW COMPARISON:  Radiograph yesterday FINDINGS: Cardiomegaly is unchanged. Stable mediastinal contours with aortic atherosclerosis. Vascular congestion is similar. Subsegmental atelectasis/scarring in the left mid lung and right lung base, unchanged. No new airspace disease. No pleural effusion or pneumothorax. IMPRESSION: Unchanged cardiomegaly and vascular congestion. Subsegmental atelectasis/scarring in the left mid lung and right lung base. Electronically Signed   By: Shawna Orleans  Sanford M.D.   On: 11/13/2022 19:09   ECHOCARDIOGRAM COMPLETE  Result Date: 11/12/2022    ECHOCARDIOGRAM REPORT   Patient Name:   Steve Jensen [facility] Date of Exam: 11/12/2022 Medical Rec #:  [id number]      Height:       71.0 in Accession #:    [id number]     Weight:       280.2 lb Date of Birth:  Jun 24, 1940     BSA:          2.433 m Patient Age:    81 years       BP:           113/66 mmHg Patient Gender: M              HR:           109 bpm. [facility] Procedure: 2D Echo, Cardiac Doppler and Color Doppler Indications:    CHF I50.9  History:        Patient has prior history of Echocardiogram examinations, most                 recent 02/21/2021. Arrythmias:Atrial Fibrillation; Risk                 Factors:Hypertension, Diabetes, Dyslipidemia and Former Smoker.  Sonographer:    Celesta Gentile RCS Referring Phys: [phone] Heloise Beecham EMOKPAE IMPRESSIONS  1. Left ventricular ejection fraction, by estimation, is 55 to 60%. The left ventricle has normal function. The left ventricle has no regional wall motion abnormalities.  There is mild concentric left ventricular hypertrophy. Left ventricular diastolic parameters are indeterminate.  2. Right ventricular systolic function is low normal. The right ventricular size is normal. There is normal pulmonary artery systolic pressure. The estimated right ventricular systolic pressure is 24.7 mmHg.  3. Left atrial size was moderately dilated.  4. The mitral valve is degenerative. Mild mitral valve regurgitation.  5. The aortic valve is tricuspid. Aortic valve regurgitation is mild. Aortic valve sclerosis/calcification is present, without any evidence of aortic stenosis.  6. The inferior vena cava is normal in size with greater than 50% respiratory variability, suggesting right atrial pressure of 3 mmHg. Comparison(s): Prior images reviewed side by side. LVEF remains normal range at 55-60%. FINDINGS  Left Ventricle: Left ventricular ejection fraction, by estimation, is 55 to 60%. The left ventricle has normal function. The left ventricle has no regional wall motion abnormalities. The left ventricular internal cavity size was normal in size. There is  mild concentric left ventricular hypertrophy. Left ventricular diastolic function could not be evaluated due to atrial fibrillation. Left ventricular diastolic parameters are indeterminate. Right Ventricle: The right ventricular size is normal. No increase in right ventricular wall thickness. Right ventricular systolic function is low normal. There is normal pulmonary artery systolic pressure. The tricuspid regurgitant velocity is 2.33 m/s,  and with an assumed right atrial pressure of 3 mmHg, the estimated right ventricular systolic pressure is 24.7 mmHg. Left Atrium: Left atrial size was moderately dilated. Right Atrium: Right atrial size was normal in size. Pericardium: There is no evidence of pericardial effusion. Mitral Valve: The mitral valve is degenerative in appearance. There is mild calcification of the mitral valve leaflet(s). Mild mitral  annular calcification. Mild mitral valve regurgitation. Tricuspid Valve: The tricuspid valve is grossly normal. Tricuspid valve regurgitation is trivial. Aortic Valve: The aortic valve is tricuspid. Aortic valve regurgitation is mild. Aortic valve sclerosis/calcification is present, without any evidence of aortic stenosis.  Pulmonic Valve: The pulmonic valve was grossly normal. Pulmonic valve regurgitation is trivial. Aorta: The aortic root is normal in size and structure. Venous: The inferior vena cava is normal in size with greater than 50% respiratory variability, suggesting right atrial pressure of 3 mmHg. IAS/Shunts: No atrial level shunt detected by color flow Doppler.  LEFT VENTRICLE PLAX 2D LVIDd:         5.00 cm LVIDs:         3.40 cm LV PW:         1.10 cm LV IVS:        1.00 cm LVOT diam:     2.10 cm LV SV:         63 LV SV Index:   26 LVOT Area:     3.46 cm  RIGHT VENTRICLE TAPSE (M-mode): 1.8 cm LEFT ATRIUM             Index        RIGHT ATRIUM           Index LA diam:        4.60 cm 1.89 cm/m   RA Area:     18.50 cm LA Vol (A2C):   73.2 ml 30.08 ml/m  RA Volume:   55.30 ml  22.72 ml/m LA Vol (A4C):   99.0 ml 40.68 ml/m LA Biplane Vol: 92.9 ml 38.18 ml/m  AORTIC VALVE LVOT Vmax:   104.50 cm/s LVOT Vmean:  69.600 cm/s LVOT VTI:    0.182 m  AORTA Ao Root diam: 3.80 cm MITRAL VALVE                TRICUSPID VALVE MV Area (PHT): 3.85 cm     TR Peak grad:   21.7 mmHg MV Decel Time: 197 msec     TR Vmax:        233.00 cm/s MV E velocity: 111.00 cm/s                             SHUNTS                             Systemic VTI:  0.18 m                             Systemic Diam: 2.10 cm Nona Dell MD Electronically signed by Nona Dell MD Signature Date/Time: 11/12/2022/4:19:23 PM    Final     Scheduled Meds:  acetaminophen  650 mg Oral Q6H   Or   acetaminophen  650 mg Rectal Q6H   apixaban  5 mg Oral BID   diltiazem  300 mg Oral Daily   doxycycline  100 mg Oral Q12H   [START ON 11/15/2022]  furosemide  40 mg Oral BID   guaiFENesin  1,200 mg Oral BID   insulin aspart  0-5 Units Subcutaneous QHS   insulin aspart  0-9 Units Subcutaneous TID WC   insulin aspart  6 Units Subcutaneous TID WC   insulin glargine-yfgn  15 Units Subcutaneous Daily   ipratropium-albuterol  3 mL Nebulization TID   metoprolol succinate  75 mg Oral Daily   potassium chloride  40 mEq Oral Once   pregabalin  100 mg Oral BID   Continuous Infusions:   LOS: 3 days   Time spent: 45 mins  Cody Albus, MD How to contact the  [facility] Attending or Consulting provider 7A - 7P or covering provider during after hours 7P -7A, for this patient?  Check the care team in [facility] and look for a) attending/consulting [facility] provider listed and b) the [facility] team listed Log into [email or web address] and use [place]'s universal password to access. If you do not have the password, please contact the hospital operator. Locate the [facility] provider you are looking for under [facility] and page to a number that you can be directly reached. If you still have difficulty reaching the provider, please page the [facility] (Director on Call) for the Hospitalists listed on amion for assistance.  11/14/2022, 12:07 PM

## 2022-11-14 NOTE — Progress Notes (Signed)
   11/14/22 1001  Assess: MEWS Score  Temp 98.6 F (37 C)  BP 125/89  MAP (mmHg) 97  Pulse Rate (!) 101  Resp 19  SpO2 93 %  O2 Device Nasal Cannula  O2 Flow Rate (L/min) 2 L/min  Assess: MEWS Score  MEWS Temp 0  MEWS Systolic 0  MEWS Pulse 1  MEWS RR 0  MEWS LOC 0  MEWS Score 1  MEWS Score Color Green  Assess: SIRS CRITERIA  SIRS Temperature  0  SIRS Pulse 1  SIRS Respirations  0  SIRS WBC 0  SIRS Score Sum  1

## 2022-11-15 DIAGNOSIS — R5381 Other malaise: Secondary | ICD-10-CM | POA: Diagnosis not present

## 2022-11-15 DIAGNOSIS — I5032 Chronic diastolic (congestive) heart failure: Secondary | ICD-10-CM | POA: Diagnosis not present

## 2022-11-15 DIAGNOSIS — Z7901 Long term (current) use of anticoagulants: Secondary | ICD-10-CM | POA: Diagnosis not present

## 2022-11-15 DIAGNOSIS — K5900 Constipation, unspecified: Secondary | ICD-10-CM | POA: Diagnosis not present

## 2022-11-15 DIAGNOSIS — G8929 Other chronic pain: Secondary | ICD-10-CM | POA: Diagnosis not present

## 2022-11-15 DIAGNOSIS — M6259 Muscle wasting and atrophy, not elsewhere classified, multiple sites: Secondary | ICD-10-CM | POA: Diagnosis not present

## 2022-11-15 DIAGNOSIS — R262 Difficulty in walking, not elsewhere classified: Secondary | ICD-10-CM | POA: Diagnosis not present

## 2022-11-15 DIAGNOSIS — R58 Hemorrhage, not elsewhere classified: Secondary | ICD-10-CM | POA: Diagnosis not present

## 2022-11-15 DIAGNOSIS — E876 Hypokalemia: Secondary | ICD-10-CM | POA: Diagnosis not present

## 2022-11-15 DIAGNOSIS — G709 Myoneural disorder, unspecified: Secondary | ICD-10-CM | POA: Diagnosis not present

## 2022-11-15 DIAGNOSIS — C220 Liver cell carcinoma: Secondary | ICD-10-CM | POA: Diagnosis not present

## 2022-11-15 DIAGNOSIS — G4733 Obstructive sleep apnea (adult) (pediatric): Secondary | ICD-10-CM | POA: Diagnosis not present

## 2022-11-15 DIAGNOSIS — D649 Anemia, unspecified: Secondary | ICD-10-CM | POA: Diagnosis not present

## 2022-11-15 DIAGNOSIS — M109 Gout, unspecified: Secondary | ICD-10-CM | POA: Diagnosis not present

## 2022-11-15 DIAGNOSIS — I10 Essential (primary) hypertension: Secondary | ICD-10-CM | POA: Diagnosis not present

## 2022-11-15 DIAGNOSIS — R29898 Other symptoms and signs involving the musculoskeletal system: Secondary | ICD-10-CM | POA: Diagnosis not present

## 2022-11-15 DIAGNOSIS — K219 Gastro-esophageal reflux disease without esophagitis: Secondary | ICD-10-CM | POA: Diagnosis not present

## 2022-11-15 DIAGNOSIS — E785 Hyperlipidemia, unspecified: Secondary | ICD-10-CM | POA: Diagnosis not present

## 2022-11-15 DIAGNOSIS — E119 Type 2 diabetes mellitus without complications: Secondary | ICD-10-CM | POA: Diagnosis not present

## 2022-11-15 DIAGNOSIS — M6281 Muscle weakness (generalized): Secondary | ICD-10-CM | POA: Diagnosis not present

## 2022-11-15 DIAGNOSIS — I4891 Unspecified atrial fibrillation: Secondary | ICD-10-CM | POA: Diagnosis not present

## 2022-11-15 DIAGNOSIS — E1342 Other specified diabetes mellitus with diabetic polyneuropathy: Secondary | ICD-10-CM | POA: Diagnosis not present

## 2022-11-15 DIAGNOSIS — I5033 Acute on chronic diastolic (congestive) heart failure: Secondary | ICD-10-CM | POA: Diagnosis not present

## 2022-11-15 DIAGNOSIS — E559 Vitamin D deficiency, unspecified: Secondary | ICD-10-CM | POA: Diagnosis not present

## 2022-11-15 DIAGNOSIS — Z79899 Other long term (current) drug therapy: Secondary | ICD-10-CM | POA: Diagnosis not present

## 2022-11-15 DIAGNOSIS — R0989 Other specified symptoms and signs involving the circulatory and respiratory systems: Secondary | ICD-10-CM | POA: Diagnosis not present

## 2022-11-15 DIAGNOSIS — R1314 Dysphagia, pharyngoesophageal phase: Secondary | ICD-10-CM | POA: Diagnosis not present

## 2022-11-15 DIAGNOSIS — E1140 Type 2 diabetes mellitus with diabetic neuropathy, unspecified: Secondary | ICD-10-CM | POA: Diagnosis not present

## 2022-11-15 DIAGNOSIS — G629 Polyneuropathy, unspecified: Secondary | ICD-10-CM | POA: Diagnosis not present

## 2022-11-15 DIAGNOSIS — I5030 Unspecified diastolic (congestive) heart failure: Secondary | ICD-10-CM | POA: Diagnosis not present

## 2022-11-15 DIAGNOSIS — S91012A Laceration without foreign body, left ankle, initial encounter: Secondary | ICD-10-CM | POA: Diagnosis not present

## 2022-11-15 DIAGNOSIS — M62838 Other muscle spasm: Secondary | ICD-10-CM | POA: Diagnosis not present

## 2022-11-15 DIAGNOSIS — R2681 Unsteadiness on feet: Secondary | ICD-10-CM | POA: Diagnosis not present

## 2022-11-15 LAB — CBC
HCT: 42.6 % (ref 39.0–52.0)
Hemoglobin: 13.6 g/dL (ref 13.0–17.0)
MCH: 26.3 pg (ref 26.0–34.0)
MCHC: 31.9 g/dL (ref 30.0–36.0)
MCV: 82.2 fL (ref 80.0–100.0)
Platelets: 300 10*3/uL (ref 150–400)
RBC: 5.18 MIL/uL (ref 4.22–5.81)
RDW: 17.3 % — ABNORMAL HIGH (ref 11.5–15.5)
WBC: 7 10*3/uL (ref 4.0–10.5)
nRBC: 0 % (ref 0.0–0.2)

## 2022-11-15 LAB — BASIC METABOLIC PANEL
Anion gap: 11 (ref 5–15)
BUN: 33 mg/dL — ABNORMAL HIGH (ref 8–23)
CO2: 23 mmol/L (ref 22–32)
Calcium: 8.4 mg/dL — ABNORMAL LOW (ref 8.9–10.3)
Chloride: 103 mmol/L (ref 98–111)
Creatinine, Ser: 1.28 mg/dL — ABNORMAL HIGH (ref 0.61–1.24)
GFR, Estimated: 57 mL/min — ABNORMAL LOW (ref >60)
Glucose, Bld: 161 mg/dL — ABNORMAL HIGH (ref 70–99)
Potassium: 3.9 mmol/L (ref 3.5–5.1)
Sodium: 137 mmol/L (ref 135–145)

## 2022-11-15 LAB — GLUCOSE, CAPILLARY
Glucose-Capillary: 137 mg/dL — ABNORMAL HIGH (ref 70–99)
Glucose-Capillary: 226 mg/dL — ABNORMAL HIGH (ref 70–99)

## 2022-11-15 LAB — MAGNESIUM: Magnesium: 2.1 mg/dL (ref 1.7–2.4)

## 2022-11-15 MED ORDER — SACCHAROMYCES BOULARDII 250 MG PO CAPS: 250.0000 mg | ORAL_CAPSULE | Freq: Two times a day (BID) | ORAL | 0 refills | Status: DC

## 2022-11-15 MED ORDER — METOPROLOL SUCCINATE ER 50 MG PO TB24: 75.0000 mg | ORAL_TABLET | Freq: Every day | ORAL | Status: DC

## 2022-11-15 MED ORDER — IPRATROPIUM-ALBUTEROL 0.5-2.5 (3) MG/3ML IN SOLN: 3.0000 mL | Freq: Two times a day (BID) | RESPIRATORY_TRACT | Status: DC

## 2022-11-15 MED ORDER — POTASSIUM CHLORIDE CRYS ER 20 MEQ PO TBCR: 20.0000 meq | EXTENDED_RELEASE_TABLET | Freq: Every day | ORAL | Status: DC

## 2022-11-15 MED ORDER — OMEPRAZOLE 20 MG PO CPDR: 20.0000 mg | DELAYED_RELEASE_CAPSULE | Freq: Every day | ORAL | Status: AC

## 2022-11-15 MED ORDER — LOPERAMIDE HCL 2 MG PO CAPS
2.0000 mg | ORAL_CAPSULE | Freq: Three times a day (TID) | ORAL | Status: DC | PRN
  Administered 2022-11-15: 2 mg via ORAL

## 2022-11-15 MED ORDER — PHENOL 1.4 % MT LIQD: 1.0000 | OROMUCOSAL | 0 refills | Status: DC | PRN

## 2022-11-15 MED ORDER — FUROSEMIDE 40 MG PO TABS: 40.0000 mg | ORAL_TABLET | Freq: Two times a day (BID) | ORAL | Status: DC

## 2022-11-15 MED ORDER — INSULIN ASPART 100 UNIT/ML IJ SOLN: 5.0000 [IU] | Freq: Three times a day (TID) | INTRAMUSCULAR | 11 refills | Status: DC

## 2022-11-15 MED ORDER — LOPERAMIDE HCL 2 MG PO CAPS: 2.0000 mg | ORAL_CAPSULE | Freq: Three times a day (TID) | ORAL | 0 refills | Status: DC | PRN

## 2022-11-15 MED ORDER — POLYETHYLENE GLYCOL 3350 17 G PO PACK: 17.0000 g | PACK | Freq: Every day | ORAL | 0 refills | Status: DC | PRN

## 2022-11-15 MED ORDER — SACCHAROMYCES BOULARDII 250 MG PO CAPS
250.0000 mg | ORAL_CAPSULE | Freq: Two times a day (BID) | ORAL | Status: DC
  Administered 2022-11-15: 250 mg via ORAL
  Filled 2022-11-15: qty 1

## 2022-11-15 MED ORDER — DEXTROMETHORPHAN POLISTIREX ER 30 MG/5ML PO SUER: 30.0000 mg | Freq: Two times a day (BID) | ORAL | 0 refills | Status: DC | PRN

## 2022-11-15 MED ORDER — IPRATROPIUM-ALBUTEROL 0.5-2.5 (3) MG/3ML IN SOLN: 3.0000 mL | RESPIRATORY_TRACT | Status: DC | PRN

## 2022-11-15 MED ORDER — GUAIFENESIN ER 600 MG PO TB12: 1200.0000 mg | ORAL_TABLET | Freq: Two times a day (BID) | ORAL | 0 refills | Status: AC

## 2022-11-15 NOTE — TOC Transition Note (Signed)
Transition of Care [facility]) - CM/SW Discharge Note   Patient Details  Name: Steve Jensen MRN: [id number] Date of Birth: 1940-07-09  [facility]) CM/SW Contact:  Catalina Gravel, LCSW Phone Number: 11/15/2022, 1:24 PM   Clinical Narrative:    Pt medically clear for DC.  CSW faxed DC Summary to facility, once reviewed, facility agreed to accept pt today.  CSW provided RR info to [facility] nursing staff, then CSW arranged transport with [facility].  ETA 3:30 PM. RN indicated no O2 need and wc fine to transport. CSW provided Rn staff DC time so pt prepared.  CSW contacted daughter in law advising pt dc to SNF this afternoon. No further TOC needs.      Barriers to Discharge: No Barriers Identified   Patient Goals and CMS Choice   Choice offered to / list presented to : Patient  Discharge Placement                         Discharge Plan and Services Additional resources added to the After Visit Summary for   In-house Referral: Clinical Social Work                                   Social Determinants of Health (SDOH) Interventions SDOH Screenings   Food Insecurity: No Food Insecurity (11/11/2022)  Housing: Patient Declined (11/11/2022)  Transportation Needs: No Transportation Needs (11/11/2022)  Utilities: Not At Risk (11/11/2022)  Tobacco Use: Medium Risk (03/11/2022)     Readmission Risk Interventions    12/26/2021   12:51 PM 12/25/2021    3:31 PM 12/19/2021   12:39 PM  Readmission Risk Prevention Plan  Transportation Screening  Complete Complete  PCP or Specialist Appt within 5-7 Days Complete    Home Care Screening  Complete   Medication Review (RN CM)  Complete   HRI or Home Care Consult   Complete  Social Work Consult for Recovery Care Planning/Counseling   Complete  Palliative Care Screening   Not Applicable  Medication Review Oceanographer)   Complete

## 2022-11-15 NOTE — Discharge Summary (Addendum)
Physician Discharge Summary  Steve Jensen ZYS:[id number] DOB: Nov 24, 1940 DOA: 11/11/2022  PCP: Lupita Raider, MD Cardiology: [facility]  Admit date: 11/11/2022 Discharge date: 11/15/2022  Admitted From:  Home  Disposition: SNF Rehab   Recommendations for Outpatient Follow-up:  Follow up with PCP in 2 weeks Follow up with cardiology in 3-4 weeks Please obtain BMP in one week to follow up electrolytes, renal function Please monitor blood glucose at least 3-4 times per day Please order outpatient palliative medicine consultation   Home Health: to SNF   Discharge Condition: STABLE   CODE STATUS: DNR DIET: 2 gram sodium restricted, fluid restriction 2L / day, carb modified, no concentrated sweets or fruit juices except to treat a low blood sugar, assist with feeding, elevate head of bed, aspiration precautions recommended.    Brief Hospitalization Summary: Please see all hospital notes, images, labs for full details of the hospitalization. ADMISSION PROVIDER HPI:  82 y.o. male with medical history significant for diastolic CHF, atrial fibrillation, hypertension, OSA, diabetes mellitus, liver cancer.  Patient was brought to the ED with reports of confusion, generalized weakness, feeling poorly today.  Patient did not exactly fall today, he was sitting halfway off his bed and with family present trying to assist him, he slid down to the floor on to his buttock. Patient's daughter-in-law is at bedside, she reports patient has intermittent episodes of confusion, which is normal for him.  His mental status is currently unchanged from his baseline.  On my evaluation he is not confused.  He has had bilateral lower extremity swelling.  Baseline over the past 2 weeks.  Reports difficulty breathing for the past 3 to 4 weeks, cough.  No chest pain.  He is on Lasix 40 mg daily and compliant.  Does not consistently check his weight.   ED Course: Tmax 99.  Heart rate 97-103.  Respiratory rate  16-25.  O2 sats 91 to 94% on room air.  Chest x-ray showing vascular congestion with mild pulm interstitial edema. BNP elevated at 457. Troponin 9 > 10. Lactic acid 1.6 Head CT without contrast negative for acute intracranial process. IV Lasix 60 mg x 1 given.  Hospitalist to admit for decompensated CHF.   Hospital Course by Problem list  * Acute on chronic diastolic (congestive) heart failure (HCC) Decompensated CHF, last echo 2022 EF of 50 to 55%, grade 1 DD.  Chest x-ray showing vascular congestion, mild interstitial edema.  BNP initially elevated at 457. - Lasix 60 mg x 1 given in ED, IV lasix given, DC IV lasix today with bump in Cr, resume oral lasix 40 twice daily starting 11/15/22.  RedsVest reading 6/22 is 28 and reassuring  -Input output, daily weight, daily BMP monitored in hospital  -updated echocardiogram: LVEF 55-60% no RWMAs, indeterminate diastolic parameters Filed Weights   11/13/22 0500 11/14/22 0506 11/15/22 0434  Weight: 123.8 kg 122.7 kg 124.3 kg    Intake/Output Summary (Last 24 hours) at 11/15/2022 1123 Last data filed at 11/15/2022 9323 Gross per 24 hour  Intake 358 ml  Output 650 ml  Net -292 ml     Hypokalemia Potassium 3.4.   2/2 diuretics.  -Replete K further on 6/20 - IV mag 2g x 1 given on 6/19 and repleted  Weakness of right lower extremity He was initially unable to lift his right lower extremity against gravity, but has good strength to left lower extremity- this has slowly improved.  Head CT negative for acute abnormality.  Presenting with generalized  weakness.  No focal neurologic deficits.  Sensation intact.  On anticoagulation with Eliquis. -MRI brain without contrast NEGATIVE FOR ACUTE FINDINGS - PT eval requested and recommending SNF and pt agreeable   Hepatocellular carcinoma (HCC) Last IR notes 04/2022, technically successful hepatic microwave ablation performed 11/24/2018 and transcatheter bland embolization performed 02/15/2018 for  biopsy-proven hepatocellular carcinoma.  Currently under surveillance with yearly abdominal MRI.   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   Obstructive sleep apnea   Atrial fibrillation (HCC)   Hepatocellular carcinoma (HCC)   HCC (hepatocellular carcinoma) (HCC)   Weakness of right lower extremity   Hypokalemia   Discharge Instructions: Discharge Instructions     Ambulatory referral to Cardiology   Complete by: As directed    Hospital Follow up 3 weeks      Allergies as of 11/15/2022       Reactions   Lipitor [atorvastatin] Other (See Comments)   Muscle aches   Simvastatin Other (See Comments)   Muscle aches   Zithromax [azithromycin] Other (See Comments)   GI-SIDE EFFECTS   Amlodipine Other (See Comments)        Medication List     STOP taking these medications    bisacodyl 10 MG suppository Commonly known as: DULCOLAX   Cinnamon 500 MG Tabs   ferrous gluconate 324 MG tablet Commonly known as: FERGON   losartan 25 MG tablet Commonly known as: Cozaar   Mounjaro 12.5  MG/0.5ML Pen Generic drug: tirzepatide   multivitamin with minerals tablet   traMADol 50 MG tablet Commonly known as: ULTRAM       TAKE these medications    acetaminophen 500 MG tablet Commonly known as: TYLENOL Take 2 tablets (1,000 mg total) by mouth every 8 (eight) hours as needed for mild pain or headache (or Fever >/= 101).   allopurinol 100 MG tablet Commonly known as: ZYLOPRIM Take 100 mg by mouth daily.   apixaban 5 MG Tabs tablet Commonly known as: Eliquis TAKE 1 TABLET(5 MG) BY MOUTH TWICE DAILY What changed:  how much to take how to take this when to take this additional instructions   dextromethorphan 30 MG/5ML liquid Commonly known as: DELSYM Take 5 mLs (30 mg total) by mouth 2 (two) times daily as needed for cough.   diclofenac Sodium 1 % Gel Commonly known as: VOLTAREN Apply 4 g topically 4 (four) times daily.   diltiazem 300 MG 24 hr capsule Commonly known as: CARDIZEM CD Take 1 capsule (300 mg total) by mouth daily.   fish oil-omega-3 fatty acids 1000 MG capsule Take 2 g by mouth 2 (two) times daily.   furosemide  40 MG tablet Commonly known as: LASIX Take 1 tablet (40 mg total) by mouth in the morning and at bedtime.   guaiFENesin 600 MG 12 hr tablet Commonly known as: MUCINEX Take 2 tablets (1,200 mg total) by mouth 2 (two) times daily for 3 days.   insulin aspart 100 UNIT/ML injection Commonly known as: novoLOG Inject 5 Units into the skin 3 (three) times daily with meals. GIVE ONLY IF EATS 50% OR MORE OF MEAL.   ipratropium-albuterol 0.5-2.5 (3) MG/3ML Soln Commonly known as: DUONEB Take 3 mLs by nebulization every 4 (four) hours as needed (wheezing, coughing, SOB).   ipratropium-albuterol 0.5-2.5 (3) MG/3ML Soln Commonly known as: DUONEB Take 3 mLs by nebulization in the morning and at bedtime.   loperamide 2 MG capsule Commonly known as: IMODIUM Take 1 capsule (2 mg total) by mouth 3 (three) times daily as needed for diarrhea or  loose stools.   loratadine 10 MG tablet Commonly known as: CLARITIN Take 10 mg by mouth daily as needed for allergies.   metFORMIN 1000 MG tablet Commonly known as: GLUCOPHAGE Take 1,000 mg by mouth 2 (two) times daily.   metoprolol succinate 50 MG 24 hr tablet Commonly known as: TOPROL-XL Take 1.5 tablets (75 mg total) by mouth daily. What changed: how much to take   omeprazole 20 MG capsule Commonly known as: PRILOSEC Take 1 capsule (20 mg total) by mouth daily.   phenol 1.4 % Liqd Commonly known as: CHLORASEPTIC Use as directed 1 spray in the mouth or throat as needed for throat irritation / pain.   polyethylene glycol 17 g packet Commonly known as: MIRALAX / GLYCOLAX Take 17 g by mouth daily as needed. What changed:  when to take this reasons to take this additional instructions   potassium chloride SA 20 MEQ tablet Commonly known as: KLOR-CON M Take 1 tablet (20 mEq total) by mouth daily. What changed: See the new instructions.   pregabalin 100 MG capsule Commonly known as: LYRICA Take 100 mg by mouth 2 (two) times a day.   saccharomyces boulardii 250 MG capsule Commonly known as: FLORASTOR Take 1 capsule (250 mg total) by mouth 2 (two) times daily.   Evaristo Bury FlexTouch 100 UNIT/ML FlexTouch Pen Generic drug: insulin degludec Inject 15 Units into the skin daily.   Zetia 10 MG tablet Generic drug: ezetimibe Take 10 mg by mouth daily.        Contact information for follow-up providers     Corky Crafts, MD. Schedule an appointment as soon as possible for a visit in 2 week(s).   Specialties: Cardiology, Radiology, Interventional Cardiology Why: Hospital Follow Up Contact information: [address] [place] [address] [phone]              Contact information for after-discharge care     Destination     [facility] Preferred SNF .   Service: Skilled Nursing Contact  information: [address] [place] [address] [phone]                    Allergies  Allergen Reactions   Lipitor [Atorvastatin] Other (See Comments)    Muscle aches   Simvastatin Other (See Comments)    Muscle aches   Zithromax [Azithromycin] Other (See Comments)    GI-SIDE EFFECTS   Amlodipine Other (See Comments)   Allergies as of 11/15/2022       Reactions   Lipitor [atorvastatin] Other (  See Comments)   Muscle aches   Simvastatin Other (See Comments)   Muscle aches   Zithromax [azithromycin] Other (See Comments)   GI-SIDE EFFECTS   Amlodipine Other (See Comments)        Medication List     STOP taking these medications    bisacodyl 10 MG suppository Commonly known as: DULCOLAX   Cinnamon 500 MG Tabs   ferrous gluconate 324 MG tablet Commonly known as: FERGON   losartan 25 MG tablet Commonly known as: Cozaar   Mounjaro 12.5 MG/0.5ML Pen Generic drug: tirzepatide   multivitamin with minerals tablet   traMADol 50 MG tablet Commonly known as: ULTRAM       TAKE these medications    acetaminophen 500 MG tablet Commonly known as: TYLENOL Take 2 tablets (1,000 mg total) by mouth every 8 (eight) hours as needed for mild pain or headache (or Fever >/= 101).   allopurinol 100 MG tablet Commonly known as: ZYLOPRIM Take 100 mg by mouth daily.   apixaban 5 MG Tabs tablet Commonly known as: Eliquis TAKE 1 TABLET(5 MG) BY MOUTH TWICE DAILY What changed:  how much to take how to take this when to take this additional instructions   dextromethorphan 30 MG/5ML liquid Commonly known as: DELSYM Take 5 mLs (30 mg total) by mouth 2 (two) times daily as needed for cough.   diclofenac Sodium 1 % Gel Commonly known as: VOLTAREN Apply 4 g topically 4 (four) times daily.   diltiazem 300 MG 24 hr capsule Commonly known as: CARDIZEM CD Take 1 capsule (300 mg total) by mouth daily.   fish oil-omega-3 fatty acids 1000 MG  capsule Take 2 g by mouth 2 (two) times daily.   furosemide 40 MG tablet Commonly known as: LASIX Take 1 tablet (40 mg total) by mouth in the morning and at bedtime.   guaiFENesin 600 MG 12 hr tablet Commonly known as: MUCINEX Take 2 tablets (1,200 mg total) by mouth 2 (two) times daily for 3 days.   insulin aspart 100 UNIT/ML injection Commonly known as: novoLOG Inject 5 Units into the skin 3 (three) times daily with meals. GIVE ONLY IF EATS 50% OR MORE OF MEAL.   ipratropium-albuterol 0.5-2.5 (3) MG/3ML Soln Commonly known as: DUONEB Take 3 mLs by nebulization every 4 (four) hours as needed (wheezing, coughing, SOB).   ipratropium-albuterol 0.5-2.5 (3) MG/3ML Soln Commonly known as: DUONEB Take 3 mLs by nebulization in the morning and at bedtime.   loperamide 2 MG capsule Commonly known as: IMODIUM Take 1 capsule (2 mg total) by mouth 3 (three) times daily as needed for diarrhea or loose stools.   loratadine 10 MG tablet Commonly known as: CLARITIN Take 10 mg by mouth daily as needed for allergies.   metFORMIN 1000 MG tablet Commonly known as: GLUCOPHAGE Take 1,000 mg by mouth 2 (two) times daily.   metoprolol succinate 50 MG 24 hr tablet Commonly known as: TOPROL-XL Take 1.5 tablets (75 mg total) by mouth daily. What changed: how much to take   omeprazole 20 MG capsule Commonly known as: PRILOSEC Take 1 capsule (20 mg total) by mouth daily.   phenol 1.4 % Liqd Commonly known as: CHLORASEPTIC Use as directed 1 spray in the mouth or throat as needed for throat irritation / pain.   polyethylene glycol 17 g packet Commonly known as: MIRALAX / GLYCOLAX Take 17 g by mouth daily as needed. What changed:  when to take this reasons to take this additional instructions  potassium chloride SA 20 MEQ tablet Commonly known as: KLOR-CON M Take 1 tablet (20 mEq total) by mouth daily. What changed: See the new instructions.   pregabalin 100 MG capsule Commonly known  as: LYRICA Take 100 mg by mouth 2 (two) times a day.   saccharomyces boulardii 250 MG capsule Commonly known as: FLORASTOR Take 1 capsule (250 mg total) by mouth 2 (two) times daily.   Evaristo Bury FlexTouch 100 UNIT/ML FlexTouch Pen Generic drug: insulin degludec Inject 15 Units into the skin daily.   Zetia 10 MG tablet Generic drug: ezetimibe Take 10 mg by mouth daily.        Procedures/Studies: DG CHEST PORT 1 VIEW  Result Date: 11/13/2022 CLINICAL DATA:  Shortness of breath and wheezing. EXAM: PORTABLE CHEST 1 VIEW COMPARISON:  Radiograph yesterday FINDINGS: Cardiomegaly is unchanged. Stable mediastinal contours with aortic atherosclerosis. Vascular congestion is similar. Subsegmental atelectasis/scarring in the left mid lung and right lung base, unchanged. No new airspace disease. No pleural effusion or pneumothorax. IMPRESSION: Unchanged cardiomegaly and vascular congestion. Subsegmental atelectasis/scarring in the left mid lung and right lung base. Electronically Signed   By: Narda Rutherford M.D.   On: 11/13/2022 19:09   ECHOCARDIOGRAM COMPLETE  Result Date: 11/12/2022    ECHOCARDIOGRAM REPORT   Patient Name:   RAMONTE MENA [facility] Date of Exam: 11/12/2022 Medical Rec #:  [id number]      Height:       71.0 in Accession #:    [id number]     Weight:       280.2 lb Date of Birth:  1941-03-31     BSA:          2.433 m Patient Age:    81 years       BP:           113/66 mmHg Patient Gender: M              HR:           109 bpm. [facility] Procedure: 2D Echo, Cardiac Doppler and Color Doppler Indications:    CHF I50.9  History:        Patient has prior history of Echocardiogram examinations, most                 recent 02/21/2021. Arrythmias:Atrial Fibrillation; Risk                 Factors:Hypertension, Diabetes, Dyslipidemia and Former Smoker.  Sonographer:    Celesta Gentile RCS Referring Phys: [phone] Heloise Beecham EMOKPAE IMPRESSIONS  1. Left ventricular ejection fraction, by  estimation, is 55 to 60%. The left ventricle has normal function. The left ventricle has no regional wall motion abnormalities. There is mild concentric left ventricular hypertrophy. Left ventricular diastolic parameters are indeterminate.  2. Right ventricular systolic function is low normal. The right ventricular size is normal. There is normal pulmonary artery systolic pressure. The estimated right ventricular systolic pressure is 24.7 mmHg.  3. Left atrial size was moderately dilated.  4. The mitral valve is degenerative. Mild mitral valve regurgitation.  5. The aortic valve is tricuspid. Aortic valve regurgitation is mild. Aortic valve sclerosis/calcification is present, without any evidence of aortic stenosis.  6. The inferior vena cava is normal in size with greater than 50% respiratory variability, suggesting right atrial pressure of 3 mmHg. Comparison(s): Prior images reviewed side by side. LVEF remains normal range at 55-60%. FINDINGS  Left Ventricle: Left ventricular ejection fraction, by estimation, is 55  to 60%. The left ventricle has normal function. The left ventricle has no regional wall motion abnormalities. The left ventricular internal cavity size was normal in size. There is  mild concentric left ventricular hypertrophy. Left ventricular diastolic function could not be evaluated due to atrial fibrillation. Left ventricular diastolic parameters are indeterminate. Right Ventricle: The right ventricular size is normal. No increase in right ventricular wall thickness. Right ventricular systolic function is low normal. There is normal pulmonary artery systolic pressure. The tricuspid regurgitant velocity is 2.33 m/s,  and with an assumed right atrial pressure of 3 mmHg, the estimated right ventricular systolic pressure is 24.7 mmHg. Left Atrium: Left atrial size was moderately dilated. Right Atrium: Right atrial size was normal in size. Pericardium: There is no evidence of pericardial effusion. Mitral  Valve: The mitral valve is degenerative in appearance. There is mild calcification of the mitral valve leaflet(s). Mild mitral annular calcification. Mild mitral valve regurgitation. Tricuspid Valve: The tricuspid valve is grossly normal. Tricuspid valve regurgitation is trivial. Aortic Valve: The aortic valve is tricuspid. Aortic valve regurgitation is mild. Aortic valve sclerosis/calcification is present, without any evidence of aortic stenosis. Pulmonic Valve: The pulmonic valve was grossly normal. Pulmonic valve regurgitation is trivial. Aorta: The aortic root is normal in size and structure. Venous: The inferior vena cava is normal in size with greater than 50% respiratory variability, suggesting right atrial pressure of 3 mmHg. IAS/Shunts: No atrial level shunt detected by color flow Doppler.  LEFT VENTRICLE PLAX 2D LVIDd:         5.00 cm LVIDs:         3.40 cm LV PW:         1.10 cm LV IVS:        1.00 cm LVOT diam:     2.10 cm LV SV:         63 LV SV Index:   26 LVOT Area:     3.46 cm  RIGHT VENTRICLE TAPSE (M-mode): 1.8 cm LEFT ATRIUM             Index        RIGHT ATRIUM           Index LA diam:        4.60 cm 1.89 cm/m   RA Area:     18.50 cm LA Vol (A2C):   73.2 ml 30.08 ml/m  RA Volume:   55.30 ml  22.72 ml/m LA Vol (A4C):   99.0 ml 40.68 ml/m LA Biplane Vol: 92.9 ml 38.18 ml/m  AORTIC VALVE LVOT Vmax:   104.50 cm/s LVOT Vmean:  69.600 cm/s LVOT VTI:    0.182 m  AORTA Ao Root diam: 3.80 cm MITRAL VALVE                TRICUSPID VALVE MV Area (PHT): 3.85 cm     TR Peak grad:   21.7 mmHg MV Decel Time: 197 msec     TR Vmax:        233.00 cm/s MV E velocity: 111.00 cm/s                             SHUNTS                             Systemic VTI:  0.18 m  Systemic Diam: 2.10 cm Nona Dell MD Electronically signed by Nona Dell MD Signature Date/Time: 11/12/2022/4:19:23 PM    Final    MR BRAIN WO CONTRAST  Result Date: 11/12/2022 CLINICAL DATA:  Right lower  extremity weakness. EXAM: MRI HEAD WITHOUT CONTRAST TECHNIQUE: Multiplanar, multiecho pulse sequences of the brain and surrounding structures were obtained without intravenous contrast. COMPARISON:  CT head June 18, 24. FINDINGS: Motion limited study. Brain: No acute infarction, hemorrhage, hydrocephalus, extra-axial collection or mass lesion. Scattered T2/FLAIR hyperintensities in the white matter, compatible with chronic microvascular ischemic disease. Cerebral atrophy. Vascular: Major arterial flow voids are maintained at the skull base. Skull and upper cervical spine: Normal marrow signal. Sinuses/Orbits: Clear sinuses.  No acute orbital findings. Other: No mastoid effusions. IMPRESSION: No evidence of acute intracranial abnormality. Motion limited study. Electronically Signed   By: Feliberto Harts M.D.   On: 11/12/2022 14:02   DG CHEST PORT 1 VIEW  Result Date: 11/12/2022 CLINICAL DATA:  Pulmonary edema. EXAM: PORTABLE CHEST 1 VIEW COMPARISON:  11/11/2022 FINDINGS: Heart size and mediastinal contours are stable. Aortic atherosclerotic calcifications. Pulmonary vascular congestion without frank pulmonary edema. Stable scar like opacities in the left midlung and right base. No airspace consolidation identified. Remote healed right clavicle fracture. Multiple healed right lateral rib fracture deformities are also identified. IMPRESSION: 1. Pulmonary vascular congestion without frank pulmonary edema. 2. Stable scar like opacities in the left midlung and right base. Aortic Atherosclerosis (ICD10-I70.0). Electronically Signed   By: Signa Kell M.D.   On: 11/12/2022 12:32   CT Head Wo Contrast  Result Date: 11/11/2022 CLINICAL DATA:  Trauma EXAM: CT HEAD WITHOUT CONTRAST TECHNIQUE: Contiguous axial images were obtained from the base of the skull through the vertex without intravenous contrast. RADIATION DOSE REDUCTION: This exam was performed according to the departmental dose-optimization program which  includes automated exposure control, adjustment of the mA and/or kV according to patient size and/or use of iterative reconstruction technique. COMPARISON:  None Available. FINDINGS: Brain: No evidence of acute infarction, hemorrhage, hydrocephalus, extra-axial collection or mass lesion/mass effect. There is mild diffuse atrophy and mild periventricular white matter hypodensity, likely chronic small vessel ischemic change. There is an old lacunar infarct in the right basal ganglia. Vascular: Atherosclerotic calcifications are present within the cavernous internal carotid arteries. Skull: Normal. Negative for fracture or focal lesion. Sinuses/Orbits: No acute finding. Other: None. IMPRESSION: 1. No acute intracranial process. 2. Mild diffuse atrophy and mild chronic small vessel ischemic change. Electronically Signed   By: Darliss Cheney M.D.   On: 11/11/2022 17:20   DG Chest Port 1 View  Result Date: 11/11/2022 CLINICAL DATA:  Fall, confusion. EXAM: PORTABLE CHEST 1 VIEW COMPARISON:  Chest radiograph 02/10/2022 FINDINGS: The heart is enlarged, unchanged. The upper mediastinal contours are normal. There is vascular congestion with possible mild pulmonary interstitial edema. There is no focal consolidation. There is no significant pleural effusion. There is no pneumothorax A remote right clavicle fracture is noted. There is no acute osseous abnormality. IMPRESSION: Cardiomegaly with vascular congestion and possible mild pulmonary interstitial edema. Electronically Signed   By: Lesia Hausen M.D.   On: 11/11/2022 13:24     Subjective: Pt says he is feeling well, no specific complaints.  He is agreeable to SNF rehab   Discharge Exam: Vitals:   11/15/22 0823 11/15/22 0838  BP:  122/74  Pulse:    Resp:    Temp:    SpO2: 92%    Vitals:   11/14/22 2319 11/15/22 0434  11/15/22 0823 11/15/22 0838  BP:  123/79  122/74  Pulse: 91 96    Resp: 20 20    Temp:  97.7 F (36.5 C)    TempSrc:  Oral    SpO2: 95%  95% 92%   Weight:  124.3 kg    Height:        General exam: awake, alert, cooperative, sitting in chair, Appears calm and comfortable  Respiratory system: mild expiratory wheezing heard, upper airway congestion noises. Cardiovascular system: normal S1 & S2 heard. No JVD, murmurs, rubs, gallops or clicks. No pedal edema. Gastrointestinal system: Abdomen is nondistended, soft and nontender. No organomegaly or masses felt. Normal bowel sounds heard. Central nervous system: Alert and oriented. No focal neurological deficits. Extremities: Symmetric 5 x 5 power. Skin: No rashes, lesions or ulcers. Psychiatry: Judgement and insight appear normal. Mood & affect appropriate.    The results of significant diagnostics from this hospitalization (including imaging, microbiology, ancillary and laboratory) are listed below for reference.     Microbiology: No results found for this or any previous visit (from the past 240 hour(s)).   Labs: BNP (last 3 results) Recent Labs    12/16/21 1816 11/11/22 1210  BNP 146.0* 457.0*   Basic Metabolic Panel: Recent Labs  Lab 11/11/22 1210 11/11/22 1411 11/12/22 0416 11/13/22 0442 11/14/22 0359 11/15/22 0526  NA 137  --  138 137 136 137  K 3.1*  --  3.4* 3.4* 2.8* 3.9  CL 101  --  102 98 100 103  CO2 26  --  24 26 23 23   GLUCOSE 159*  --  115* 146* 146* 161*  BUN 14  --  14 17 25* 33*  CREATININE 1.09  --  1.17 1.26* 1.29* 1.28*  CALCIUM 8.2*  --  8.1* 8.1* 8.2* 8.4*  MG  --  1.7  --  2.0 2.0 2.1   Liver Function Tests: Recent Labs  Lab 11/11/22 1210  AST 21  ALT 24  ALKPHOS 126  BILITOT 0.9  PROT 6.2*  ALBUMIN 3.4*   No results for input(s): "LIPASE", "AMYLASE" in the last 168 hours. No results for input(s): "AMMONIA" in the last 168 hours. CBC: Recent Labs  Lab 11/11/22 1210 11/12/22 0416 11/13/22 0442 11/15/22 0526  WBC 7.3 6.8 6.9 7.0  NEUTROABS 4.7  --   --   --   HGB 13.0 13.7 14.0 13.6  HCT 40.2 43.0 44.2 42.6  MCV  81.7 81.7 81.7 82.2  PLT 272 277 272 300   Cardiac Enzymes: No results for input(s): "CKTOTAL", "CKMB", "CKMBINDEX", "TROPONINI" in the last 168 hours. BNP: Invalid input(s): "POCBNP" CBG: Recent Labs  Lab 11/14/22 1056 11/14/22 1616 11/14/22 2209 11/15/22 0718 11/15/22 1108  GLUCAP 256* 218* 203* 137* 226*   D-Dimer No results for input(s): "DDIMER" in the last 72 hours. Hgb A1c No results for input(s): "HGBA1C" in the last 72 hours. Lipid Profile No results for input(s): "CHOL", "HDL", "LDLCALC", "TRIG", "CHOLHDL", "LDLDIRECT" in the last 72 hours. Thyroid function studies No results for input(s): "TSH", "T4TOTAL", "T3FREE", "THYROIDAB" in the last 72 hours.  Invalid input(s): "FREET3" Anemia work up No results for input(s): "VITAMINB12", "FOLATE", "FERRITIN", "TIBC", "IRON", "RETICCTPCT" in the last 72 hours. Urinalysis    Component Value Date/Time   COLORURINE YELLOW 12/24/2021 0444   APPEARANCEUR CLOUDY (A) 12/24/2021 0444   LABSPEC 1.036 (H) 12/24/2021 0444   PHURINE 5.0 12/24/2021 0444   GLUCOSEU NEGATIVE 12/24/2021 0444   HGBUR NEGATIVE 12/24/2021 0444  BILIRUBINUR NEGATIVE 12/24/2021 0444   KETONESUR 5 (A) 12/24/2021 0444   PROTEINUR 30 (A) 12/24/2021 0444   NITRITE NEGATIVE 12/24/2021 0444   LEUKOCYTESUR NEGATIVE 12/24/2021 0444   Sepsis Labs Recent Labs  Lab 11/11/22 1210 11/12/22 0416 11/13/22 0442 11/15/22 0526  WBC 7.3 6.8 6.9 7.0   Microbiology No results found for this or any previous visit (from the past 240 hour(s)).  Time coordinating discharge: 47 mins  SIGNED:  Standley Dakins, MD  [facility] 11/15/2022, 11:27 AM How to contact the [facility] Attending or Consulting provider 7A - 7P or covering provider during after hours 7P -7A, for this patient?  Check the care team in [facility] and look for a) attending/consulting [facility] provider listed and b) the [facility] team listed Log into [email or web address] and use [place]'s universal password to  access. If you do not have the password, please contact the hospital operator. Locate the [facility] provider you are looking for under [facility] and page to a number that you can be directly reached. If you still have difficulty reaching the provider, please page the [facility] (Director on Call) for the Hospitalists listed on amion for assistance.

## 2022-11-15 NOTE — Progress Notes (Signed)
SATURATION QUALIFICATIONS: (This note is used to comply with regulatory documentation for home oxygen)  Patient Saturations on Room Air at Rest = 95%  Patient Saturations on Room Air while Ambulating (exertion only, pt not ambulatory/wheelchair bound) = 93%   Please briefly explain why patient needs home oxygen: pt with no desaturation or increased SOB on room air at both rest and with exertion.

## 2022-11-15 NOTE — Progress Notes (Signed)
Patient discharged to SNF, transported by [facility] transport. Discharge paperwork placed in discharge packet to give to facility. Belongings sent with patient.

## 2022-11-15 NOTE — Progress Notes (Signed)
   11/15/22 0800  ReDS Vest / Clip  Station Marker D  Ruler Value 41  ReDS Value Range < 36  ReDS Actual Value 28

## 2022-11-15 NOTE — Discharge Instructions (Signed)
IMPORTANT INFORMATION: PAY CLOSE ATTENTION   PHYSICIAN DISCHARGE INSTRUCTIONS  Follow with Primary care provider  Shaw, Kimberlee, MD  and other consultants as instructed by your Hospitalist Physician  SEEK MEDICAL CARE OR RETURN TO EMERGENCY ROOM IF SYMPTOMS COME BACK, WORSEN OR NEW PROBLEM DEVELOPS   Please note: You were cared for by a hospitalist during your hospital stay. Every effort will be made to forward records to your primary care provider.  You can request that your primary care provider send for your hospital records if they have not received them.  Once you are discharged, your primary care physician will handle any further medical issues. Please note that NO REFILLS for any discharge medications will be authorized once you are discharged, as it is imperative that you return to your primary care physician (or establish a relationship with a primary care physician if you do not have one) for your post hospital discharge needs so that they can reassess your need for medications and monitor your lab values.  Please get a complete blood count and chemistry panel checked by your Primary MD at your next visit, and again as instructed by your Primary MD.  Get Medicines reviewed and adjusted: Please take all your medications with you for your next visit with your Primary MD  Laboratory/radiological data: Please request your Primary MD to go over all hospital tests and procedure/radiological results at the follow up, please ask your primary care provider to get all Hospital records sent to his/her office.  In some cases, they will be blood work, cultures and biopsy results pending at the time of your discharge. Please request that your primary care provider follow up on these results.  If you are diabetic, please bring your blood sugar readings with you to your follow up appointment with primary care.    Please call and make your follow up appointments as soon as possible.    Also Note  the following: If you experience worsening of your admission symptoms, develop shortness of breath, life threatening emergency, suicidal or homicidal thoughts you must seek medical attention immediately by calling 911 or calling your MD immediately  if symptoms less severe.  You must read complete instructions/literature along with all the possible adverse reactions/side effects for all the Medicines you take and that have been prescribed to you. Take any new Medicines after you have completely understood and accpet all the possible adverse reactions/side effects.   Do not drive when taking Pain medications or sleeping medications (Benzodiazepines)  Do not take more than prescribed Pain, Sleep and Anxiety Medications. It is not advisable to combine anxiety,sleep and pain medications without talking with your primary care practitioner  Special Instructions: If you have smoked or chewed Tobacco  in the last 2 yrs please stop smoking, stop any regular Alcohol  and or any Recreational drug use.  Wear Seat belts while driving.  Do not drive if taking any narcotic, mind altering or controlled substances or recreational drugs or alcohol.       

## 2022-11-17 DIAGNOSIS — G8929 Other chronic pain: Secondary | ICD-10-CM | POA: Diagnosis not present

## 2022-11-17 DIAGNOSIS — I4891 Unspecified atrial fibrillation: Secondary | ICD-10-CM | POA: Diagnosis not present

## 2022-11-17 DIAGNOSIS — K219 Gastro-esophageal reflux disease without esophagitis: Secondary | ICD-10-CM | POA: Diagnosis not present

## 2022-11-17 DIAGNOSIS — E785 Hyperlipidemia, unspecified: Secondary | ICD-10-CM | POA: Diagnosis not present

## 2022-11-17 DIAGNOSIS — G4733 Obstructive sleep apnea (adult) (pediatric): Secondary | ICD-10-CM | POA: Diagnosis not present

## 2022-11-17 DIAGNOSIS — I10 Essential (primary) hypertension: Secondary | ICD-10-CM | POA: Diagnosis not present

## 2022-11-17 DIAGNOSIS — E119 Type 2 diabetes mellitus without complications: Secondary | ICD-10-CM | POA: Diagnosis not present

## 2022-11-17 DIAGNOSIS — D649 Anemia, unspecified: Secondary | ICD-10-CM | POA: Diagnosis not present

## 2022-11-17 DIAGNOSIS — R5381 Other malaise: Secondary | ICD-10-CM | POA: Diagnosis not present

## 2022-11-17 DIAGNOSIS — M109 Gout, unspecified: Secondary | ICD-10-CM | POA: Diagnosis not present

## 2022-11-17 DIAGNOSIS — C220 Liver cell carcinoma: Secondary | ICD-10-CM | POA: Diagnosis not present

## 2022-11-17 DIAGNOSIS — I5030 Unspecified diastolic (congestive) heart failure: Secondary | ICD-10-CM | POA: Diagnosis not present

## 2022-11-17 DIAGNOSIS — R0989 Other specified symptoms and signs involving the circulatory and respiratory systems: Secondary | ICD-10-CM | POA: Diagnosis not present

## 2022-11-18 MED ORDER — PERFLUTREN LIPID MICROSPHERE
1.0000 mL | INTRAVENOUS | Status: AC | PRN
  Administered 2022-11-12: 4 mL via INTRAVENOUS

## 2022-11-19 DIAGNOSIS — M109 Gout, unspecified: Secondary | ICD-10-CM | POA: Diagnosis not present

## 2022-11-19 DIAGNOSIS — E785 Hyperlipidemia, unspecified: Secondary | ICD-10-CM | POA: Diagnosis not present

## 2022-11-19 DIAGNOSIS — G8929 Other chronic pain: Secondary | ICD-10-CM | POA: Diagnosis not present

## 2022-11-19 DIAGNOSIS — I10 Essential (primary) hypertension: Secondary | ICD-10-CM | POA: Diagnosis not present

## 2022-11-19 DIAGNOSIS — D649 Anemia, unspecified: Secondary | ICD-10-CM | POA: Diagnosis not present

## 2022-11-19 DIAGNOSIS — R5381 Other malaise: Secondary | ICD-10-CM | POA: Diagnosis not present

## 2022-11-19 DIAGNOSIS — I5030 Unspecified diastolic (congestive) heart failure: Secondary | ICD-10-CM | POA: Diagnosis not present

## 2022-11-19 DIAGNOSIS — K219 Gastro-esophageal reflux disease without esophagitis: Secondary | ICD-10-CM | POA: Diagnosis not present

## 2022-11-19 DIAGNOSIS — G4733 Obstructive sleep apnea (adult) (pediatric): Secondary | ICD-10-CM | POA: Diagnosis not present

## 2022-11-19 DIAGNOSIS — C220 Liver cell carcinoma: Secondary | ICD-10-CM | POA: Diagnosis not present

## 2022-11-19 DIAGNOSIS — I4891 Unspecified atrial fibrillation: Secondary | ICD-10-CM | POA: Diagnosis not present

## 2022-11-19 DIAGNOSIS — M62838 Other muscle spasm: Secondary | ICD-10-CM | POA: Diagnosis not present

## 2022-11-20 DIAGNOSIS — I5030 Unspecified diastolic (congestive) heart failure: Secondary | ICD-10-CM | POA: Diagnosis not present

## 2022-11-20 DIAGNOSIS — I4891 Unspecified atrial fibrillation: Secondary | ICD-10-CM | POA: Diagnosis not present

## 2022-11-20 DIAGNOSIS — K5900 Constipation, unspecified: Secondary | ICD-10-CM | POA: Diagnosis not present

## 2022-11-20 DIAGNOSIS — I10 Essential (primary) hypertension: Secondary | ICD-10-CM | POA: Diagnosis not present

## 2022-11-20 DIAGNOSIS — K219 Gastro-esophageal reflux disease without esophagitis: Secondary | ICD-10-CM | POA: Diagnosis not present

## 2022-11-20 DIAGNOSIS — G4733 Obstructive sleep apnea (adult) (pediatric): Secondary | ICD-10-CM | POA: Diagnosis not present

## 2022-11-20 DIAGNOSIS — M109 Gout, unspecified: Secondary | ICD-10-CM | POA: Diagnosis not present

## 2022-11-20 DIAGNOSIS — E785 Hyperlipidemia, unspecified: Secondary | ICD-10-CM | POA: Diagnosis not present

## 2022-11-20 DIAGNOSIS — R5381 Other malaise: Secondary | ICD-10-CM | POA: Diagnosis not present

## 2022-11-20 DIAGNOSIS — M62838 Other muscle spasm: Secondary | ICD-10-CM | POA: Diagnosis not present

## 2022-11-20 DIAGNOSIS — D649 Anemia, unspecified: Secondary | ICD-10-CM | POA: Diagnosis not present

## 2022-11-20 DIAGNOSIS — E119 Type 2 diabetes mellitus without complications: Secondary | ICD-10-CM | POA: Diagnosis not present

## 2022-11-24 DIAGNOSIS — M6281 Muscle weakness (generalized): Secondary | ICD-10-CM | POA: Diagnosis not present

## 2022-11-24 DIAGNOSIS — M62838 Other muscle spasm: Secondary | ICD-10-CM | POA: Diagnosis not present

## 2022-11-24 DIAGNOSIS — K219 Gastro-esophageal reflux disease without esophagitis: Secondary | ICD-10-CM | POA: Diagnosis not present

## 2022-11-24 DIAGNOSIS — R262 Difficulty in walking, not elsewhere classified: Secondary | ICD-10-CM | POA: Diagnosis not present

## 2022-11-24 DIAGNOSIS — R58 Hemorrhage, not elsewhere classified: Secondary | ICD-10-CM | POA: Diagnosis not present

## 2022-11-24 DIAGNOSIS — E785 Hyperlipidemia, unspecified: Secondary | ICD-10-CM | POA: Diagnosis not present

## 2022-11-24 DIAGNOSIS — G4733 Obstructive sleep apnea (adult) (pediatric): Secondary | ICD-10-CM | POA: Diagnosis not present

## 2022-11-24 DIAGNOSIS — S91012A Laceration without foreign body, left ankle, initial encounter: Secondary | ICD-10-CM | POA: Diagnosis not present

## 2022-11-24 DIAGNOSIS — I10 Essential (primary) hypertension: Secondary | ICD-10-CM | POA: Diagnosis not present

## 2022-11-24 DIAGNOSIS — K5900 Constipation, unspecified: Secondary | ICD-10-CM | POA: Diagnosis not present

## 2022-11-24 DIAGNOSIS — R5381 Other malaise: Secondary | ICD-10-CM | POA: Diagnosis not present

## 2022-11-24 DIAGNOSIS — C220 Liver cell carcinoma: Secondary | ICD-10-CM | POA: Diagnosis not present

## 2022-11-24 DIAGNOSIS — M109 Gout, unspecified: Secondary | ICD-10-CM | POA: Diagnosis not present

## 2022-11-24 DIAGNOSIS — I5030 Unspecified diastolic (congestive) heart failure: Secondary | ICD-10-CM | POA: Diagnosis not present

## 2022-11-24 DIAGNOSIS — Z7901 Long term (current) use of anticoagulants: Secondary | ICD-10-CM | POA: Diagnosis not present

## 2022-11-24 DIAGNOSIS — M6259 Muscle wasting and atrophy, not elsewhere classified, multiple sites: Secondary | ICD-10-CM | POA: Diagnosis not present

## 2022-11-24 DIAGNOSIS — I4891 Unspecified atrial fibrillation: Secondary | ICD-10-CM | POA: Diagnosis not present

## 2022-11-24 DIAGNOSIS — E119 Type 2 diabetes mellitus without complications: Secondary | ICD-10-CM | POA: Diagnosis not present

## 2022-11-25 DIAGNOSIS — Z7901 Long term (current) use of anticoagulants: Secondary | ICD-10-CM | POA: Diagnosis not present

## 2022-11-25 DIAGNOSIS — S91012A Laceration without foreign body, left ankle, initial encounter: Secondary | ICD-10-CM | POA: Diagnosis not present

## 2022-11-25 DIAGNOSIS — R58 Hemorrhage, not elsewhere classified: Secondary | ICD-10-CM | POA: Diagnosis not present

## 2022-11-26 DIAGNOSIS — I4891 Unspecified atrial fibrillation: Secondary | ICD-10-CM | POA: Diagnosis not present

## 2022-11-26 DIAGNOSIS — I10 Essential (primary) hypertension: Secondary | ICD-10-CM | POA: Diagnosis not present

## 2022-11-26 DIAGNOSIS — R5381 Other malaise: Secondary | ICD-10-CM | POA: Diagnosis not present

## 2022-11-26 DIAGNOSIS — G4733 Obstructive sleep apnea (adult) (pediatric): Secondary | ICD-10-CM | POA: Diagnosis not present

## 2022-11-26 DIAGNOSIS — I5030 Unspecified diastolic (congestive) heart failure: Secondary | ICD-10-CM | POA: Diagnosis not present

## 2022-11-26 DIAGNOSIS — M62838 Other muscle spasm: Secondary | ICD-10-CM | POA: Diagnosis not present

## 2022-11-26 DIAGNOSIS — G629 Polyneuropathy, unspecified: Secondary | ICD-10-CM | POA: Diagnosis not present

## 2022-11-26 DIAGNOSIS — C220 Liver cell carcinoma: Secondary | ICD-10-CM | POA: Diagnosis not present

## 2022-11-26 DIAGNOSIS — E119 Type 2 diabetes mellitus without complications: Secondary | ICD-10-CM | POA: Diagnosis not present

## 2022-11-26 DIAGNOSIS — K5900 Constipation, unspecified: Secondary | ICD-10-CM | POA: Diagnosis not present

## 2022-11-28 DIAGNOSIS — E1140 Type 2 diabetes mellitus with diabetic neuropathy, unspecified: Secondary | ICD-10-CM | POA: Diagnosis not present

## 2022-12-01 DIAGNOSIS — Z87891 Personal history of nicotine dependence: Secondary | ICD-10-CM | POA: Diagnosis not present

## 2022-12-01 DIAGNOSIS — Z79899 Other long term (current) drug therapy: Secondary | ICD-10-CM | POA: Diagnosis not present

## 2022-12-01 DIAGNOSIS — B029 Zoster without complications: Secondary | ICD-10-CM | POA: Diagnosis not present

## 2022-12-01 DIAGNOSIS — M109 Gout, unspecified: Secondary | ICD-10-CM | POA: Diagnosis not present

## 2022-12-01 DIAGNOSIS — Z7984 Long term (current) use of oral hypoglycemic drugs: Secondary | ICD-10-CM | POA: Diagnosis not present

## 2022-12-01 DIAGNOSIS — Z556 Problems related to health literacy: Secondary | ICD-10-CM | POA: Diagnosis not present

## 2022-12-01 DIAGNOSIS — I5033 Acute on chronic diastolic (congestive) heart failure: Secondary | ICD-10-CM | POA: Diagnosis not present

## 2022-12-01 DIAGNOSIS — I110 Hypertensive heart disease with heart failure: Secondary | ICD-10-CM | POA: Diagnosis not present

## 2022-12-01 DIAGNOSIS — G4733 Obstructive sleep apnea (adult) (pediatric): Secondary | ICD-10-CM | POA: Diagnosis not present

## 2022-12-01 DIAGNOSIS — I4891 Unspecified atrial fibrillation: Secondary | ICD-10-CM | POA: Diagnosis not present

## 2022-12-01 DIAGNOSIS — C220 Liver cell carcinoma: Secondary | ICD-10-CM | POA: Diagnosis not present

## 2022-12-01 DIAGNOSIS — E1140 Type 2 diabetes mellitus with diabetic neuropathy, unspecified: Secondary | ICD-10-CM | POA: Diagnosis not present

## 2022-12-01 DIAGNOSIS — D649 Anemia, unspecified: Secondary | ICD-10-CM | POA: Diagnosis not present

## 2022-12-01 DIAGNOSIS — Z7901 Long term (current) use of anticoagulants: Secondary | ICD-10-CM | POA: Diagnosis not present

## 2022-12-01 DIAGNOSIS — K5900 Constipation, unspecified: Secondary | ICD-10-CM | POA: Diagnosis not present

## 2022-12-01 DIAGNOSIS — Z9181 History of falling: Secondary | ICD-10-CM | POA: Diagnosis not present

## 2022-12-01 DIAGNOSIS — Z794 Long term (current) use of insulin: Secondary | ICD-10-CM | POA: Diagnosis not present

## 2022-12-01 DIAGNOSIS — G4700 Insomnia, unspecified: Secondary | ICD-10-CM | POA: Diagnosis not present

## 2022-12-01 DIAGNOSIS — E785 Hyperlipidemia, unspecified: Secondary | ICD-10-CM | POA: Diagnosis not present

## 2022-12-01 DIAGNOSIS — E876 Hypokalemia: Secondary | ICD-10-CM | POA: Diagnosis not present

## 2022-12-01 DIAGNOSIS — K219 Gastro-esophageal reflux disease without esophagitis: Secondary | ICD-10-CM | POA: Diagnosis not present

## 2022-12-03 DIAGNOSIS — Z7901 Long term (current) use of anticoagulants: Secondary | ICD-10-CM | POA: Diagnosis not present

## 2022-12-03 DIAGNOSIS — E785 Hyperlipidemia, unspecified: Secondary | ICD-10-CM | POA: Diagnosis not present

## 2022-12-03 DIAGNOSIS — Z7984 Long term (current) use of oral hypoglycemic drugs: Secondary | ICD-10-CM | POA: Diagnosis not present

## 2022-12-03 DIAGNOSIS — I4891 Unspecified atrial fibrillation: Secondary | ICD-10-CM | POA: Diagnosis not present

## 2022-12-03 DIAGNOSIS — D649 Anemia, unspecified: Secondary | ICD-10-CM | POA: Diagnosis not present

## 2022-12-03 DIAGNOSIS — C220 Liver cell carcinoma: Secondary | ICD-10-CM | POA: Diagnosis not present

## 2022-12-03 DIAGNOSIS — I110 Hypertensive heart disease with heart failure: Secondary | ICD-10-CM | POA: Diagnosis not present

## 2022-12-03 DIAGNOSIS — K5900 Constipation, unspecified: Secondary | ICD-10-CM | POA: Diagnosis not present

## 2022-12-03 DIAGNOSIS — G4700 Insomnia, unspecified: Secondary | ICD-10-CM | POA: Diagnosis not present

## 2022-12-03 DIAGNOSIS — G4733 Obstructive sleep apnea (adult) (pediatric): Secondary | ICD-10-CM | POA: Diagnosis not present

## 2022-12-03 DIAGNOSIS — Z556 Problems related to health literacy: Secondary | ICD-10-CM | POA: Diagnosis not present

## 2022-12-03 DIAGNOSIS — M109 Gout, unspecified: Secondary | ICD-10-CM | POA: Diagnosis not present

## 2022-12-03 DIAGNOSIS — E876 Hypokalemia: Secondary | ICD-10-CM | POA: Diagnosis not present

## 2022-12-03 DIAGNOSIS — B029 Zoster without complications: Secondary | ICD-10-CM | POA: Diagnosis not present

## 2022-12-03 DIAGNOSIS — Z87891 Personal history of nicotine dependence: Secondary | ICD-10-CM | POA: Diagnosis not present

## 2022-12-03 DIAGNOSIS — Z9181 History of falling: Secondary | ICD-10-CM | POA: Diagnosis not present

## 2022-12-03 DIAGNOSIS — I5033 Acute on chronic diastolic (congestive) heart failure: Secondary | ICD-10-CM | POA: Diagnosis not present

## 2022-12-03 DIAGNOSIS — Z79899 Other long term (current) drug therapy: Secondary | ICD-10-CM | POA: Diagnosis not present

## 2022-12-03 DIAGNOSIS — E1140 Type 2 diabetes mellitus with diabetic neuropathy, unspecified: Secondary | ICD-10-CM | POA: Diagnosis not present

## 2022-12-03 DIAGNOSIS — K219 Gastro-esophageal reflux disease without esophagitis: Secondary | ICD-10-CM | POA: Diagnosis not present

## 2022-12-03 DIAGNOSIS — Z794 Long term (current) use of insulin: Secondary | ICD-10-CM | POA: Diagnosis not present

## 2022-12-05 DIAGNOSIS — B029 Zoster without complications: Secondary | ICD-10-CM | POA: Diagnosis not present

## 2022-12-05 DIAGNOSIS — I110 Hypertensive heart disease with heart failure: Secondary | ICD-10-CM | POA: Diagnosis not present

## 2022-12-05 DIAGNOSIS — K5900 Constipation, unspecified: Secondary | ICD-10-CM | POA: Diagnosis not present

## 2022-12-05 DIAGNOSIS — I4891 Unspecified atrial fibrillation: Secondary | ICD-10-CM | POA: Diagnosis not present

## 2022-12-05 DIAGNOSIS — Z794 Long term (current) use of insulin: Secondary | ICD-10-CM | POA: Diagnosis not present

## 2022-12-05 DIAGNOSIS — C220 Liver cell carcinoma: Secondary | ICD-10-CM | POA: Diagnosis not present

## 2022-12-05 DIAGNOSIS — E785 Hyperlipidemia, unspecified: Secondary | ICD-10-CM | POA: Diagnosis not present

## 2022-12-05 DIAGNOSIS — Z556 Problems related to health literacy: Secondary | ICD-10-CM | POA: Diagnosis not present

## 2022-12-05 DIAGNOSIS — Z9181 History of falling: Secondary | ICD-10-CM | POA: Diagnosis not present

## 2022-12-05 DIAGNOSIS — Z79899 Other long term (current) drug therapy: Secondary | ICD-10-CM | POA: Diagnosis not present

## 2022-12-05 DIAGNOSIS — E1140 Type 2 diabetes mellitus with diabetic neuropathy, unspecified: Secondary | ICD-10-CM | POA: Diagnosis not present

## 2022-12-05 DIAGNOSIS — G4700 Insomnia, unspecified: Secondary | ICD-10-CM | POA: Diagnosis not present

## 2022-12-05 DIAGNOSIS — E876 Hypokalemia: Secondary | ICD-10-CM | POA: Diagnosis not present

## 2022-12-05 DIAGNOSIS — Z7901 Long term (current) use of anticoagulants: Secondary | ICD-10-CM | POA: Diagnosis not present

## 2022-12-05 DIAGNOSIS — D649 Anemia, unspecified: Secondary | ICD-10-CM | POA: Diagnosis not present

## 2022-12-05 DIAGNOSIS — Z7984 Long term (current) use of oral hypoglycemic drugs: Secondary | ICD-10-CM | POA: Diagnosis not present

## 2022-12-05 DIAGNOSIS — Z87891 Personal history of nicotine dependence: Secondary | ICD-10-CM | POA: Diagnosis not present

## 2022-12-05 DIAGNOSIS — I5033 Acute on chronic diastolic (congestive) heart failure: Secondary | ICD-10-CM | POA: Diagnosis not present

## 2022-12-05 DIAGNOSIS — K219 Gastro-esophageal reflux disease without esophagitis: Secondary | ICD-10-CM | POA: Diagnosis not present

## 2022-12-05 DIAGNOSIS — G4733 Obstructive sleep apnea (adult) (pediatric): Secondary | ICD-10-CM | POA: Diagnosis not present

## 2022-12-05 DIAGNOSIS — M109 Gout, unspecified: Secondary | ICD-10-CM | POA: Diagnosis not present

## 2022-12-08 DIAGNOSIS — K219 Gastro-esophageal reflux disease without esophagitis: Secondary | ICD-10-CM | POA: Diagnosis not present

## 2022-12-08 DIAGNOSIS — D649 Anemia, unspecified: Secondary | ICD-10-CM | POA: Diagnosis not present

## 2022-12-08 DIAGNOSIS — G4733 Obstructive sleep apnea (adult) (pediatric): Secondary | ICD-10-CM | POA: Diagnosis not present

## 2022-12-08 DIAGNOSIS — I480 Paroxysmal atrial fibrillation: Secondary | ICD-10-CM | POA: Diagnosis not present

## 2022-12-08 DIAGNOSIS — B029 Zoster without complications: Secondary | ICD-10-CM | POA: Diagnosis not present

## 2022-12-08 DIAGNOSIS — Z7984 Long term (current) use of oral hypoglycemic drugs: Secondary | ICD-10-CM | POA: Diagnosis not present

## 2022-12-08 DIAGNOSIS — G4700 Insomnia, unspecified: Secondary | ICD-10-CM | POA: Diagnosis not present

## 2022-12-08 DIAGNOSIS — Z556 Problems related to health literacy: Secondary | ICD-10-CM | POA: Diagnosis not present

## 2022-12-08 DIAGNOSIS — E785 Hyperlipidemia, unspecified: Secondary | ICD-10-CM | POA: Diagnosis not present

## 2022-12-08 DIAGNOSIS — Z7901 Long term (current) use of anticoagulants: Secondary | ICD-10-CM | POA: Diagnosis not present

## 2022-12-08 DIAGNOSIS — Z794 Long term (current) use of insulin: Secondary | ICD-10-CM | POA: Diagnosis not present

## 2022-12-08 DIAGNOSIS — C220 Liver cell carcinoma: Secondary | ICD-10-CM | POA: Diagnosis not present

## 2022-12-08 DIAGNOSIS — I5033 Acute on chronic diastolic (congestive) heart failure: Secondary | ICD-10-CM | POA: Diagnosis not present

## 2022-12-08 DIAGNOSIS — I110 Hypertensive heart disease with heart failure: Secondary | ICD-10-CM | POA: Diagnosis not present

## 2022-12-08 DIAGNOSIS — M109 Gout, unspecified: Secondary | ICD-10-CM | POA: Diagnosis not present

## 2022-12-08 DIAGNOSIS — Z87891 Personal history of nicotine dependence: Secondary | ICD-10-CM | POA: Diagnosis not present

## 2022-12-08 DIAGNOSIS — Z9181 History of falling: Secondary | ICD-10-CM | POA: Diagnosis not present

## 2022-12-08 DIAGNOSIS — I4891 Unspecified atrial fibrillation: Secondary | ICD-10-CM | POA: Diagnosis not present

## 2022-12-08 DIAGNOSIS — E876 Hypokalemia: Secondary | ICD-10-CM | POA: Diagnosis not present

## 2022-12-08 DIAGNOSIS — K7460 Unspecified cirrhosis of liver: Secondary | ICD-10-CM | POA: Diagnosis not present

## 2022-12-08 DIAGNOSIS — E1140 Type 2 diabetes mellitus with diabetic neuropathy, unspecified: Secondary | ICD-10-CM | POA: Diagnosis not present

## 2022-12-08 DIAGNOSIS — Z79899 Other long term (current) drug therapy: Secondary | ICD-10-CM | POA: Diagnosis not present

## 2022-12-08 DIAGNOSIS — I5030 Unspecified diastolic (congestive) heart failure: Secondary | ICD-10-CM | POA: Diagnosis not present

## 2022-12-08 DIAGNOSIS — K5900 Constipation, unspecified: Secondary | ICD-10-CM | POA: Diagnosis not present

## 2022-12-09 DIAGNOSIS — Z556 Problems related to health literacy: Secondary | ICD-10-CM | POA: Diagnosis not present

## 2022-12-09 DIAGNOSIS — D649 Anemia, unspecified: Secondary | ICD-10-CM | POA: Diagnosis not present

## 2022-12-09 DIAGNOSIS — Z794 Long term (current) use of insulin: Secondary | ICD-10-CM | POA: Diagnosis not present

## 2022-12-09 DIAGNOSIS — Z7984 Long term (current) use of oral hypoglycemic drugs: Secondary | ICD-10-CM | POA: Diagnosis not present

## 2022-12-09 DIAGNOSIS — G4733 Obstructive sleep apnea (adult) (pediatric): Secondary | ICD-10-CM | POA: Diagnosis not present

## 2022-12-09 DIAGNOSIS — K219 Gastro-esophageal reflux disease without esophagitis: Secondary | ICD-10-CM | POA: Diagnosis not present

## 2022-12-09 DIAGNOSIS — E876 Hypokalemia: Secondary | ICD-10-CM | POA: Diagnosis not present

## 2022-12-09 DIAGNOSIS — Z79899 Other long term (current) drug therapy: Secondary | ICD-10-CM | POA: Diagnosis not present

## 2022-12-09 DIAGNOSIS — I4891 Unspecified atrial fibrillation: Secondary | ICD-10-CM | POA: Diagnosis not present

## 2022-12-09 DIAGNOSIS — C220 Liver cell carcinoma: Secondary | ICD-10-CM | POA: Diagnosis not present

## 2022-12-09 DIAGNOSIS — E785 Hyperlipidemia, unspecified: Secondary | ICD-10-CM | POA: Diagnosis not present

## 2022-12-09 DIAGNOSIS — E1140 Type 2 diabetes mellitus with diabetic neuropathy, unspecified: Secondary | ICD-10-CM | POA: Diagnosis not present

## 2022-12-09 DIAGNOSIS — I5033 Acute on chronic diastolic (congestive) heart failure: Secondary | ICD-10-CM | POA: Diagnosis not present

## 2022-12-09 DIAGNOSIS — Z9181 History of falling: Secondary | ICD-10-CM | POA: Diagnosis not present

## 2022-12-09 DIAGNOSIS — I110 Hypertensive heart disease with heart failure: Secondary | ICD-10-CM | POA: Diagnosis not present

## 2022-12-09 DIAGNOSIS — B029 Zoster without complications: Secondary | ICD-10-CM | POA: Diagnosis not present

## 2022-12-09 DIAGNOSIS — Z7901 Long term (current) use of anticoagulants: Secondary | ICD-10-CM | POA: Diagnosis not present

## 2022-12-09 DIAGNOSIS — Z87891 Personal history of nicotine dependence: Secondary | ICD-10-CM | POA: Diagnosis not present

## 2022-12-09 DIAGNOSIS — M109 Gout, unspecified: Secondary | ICD-10-CM | POA: Diagnosis not present

## 2022-12-09 DIAGNOSIS — K5900 Constipation, unspecified: Secondary | ICD-10-CM | POA: Diagnosis not present

## 2022-12-09 DIAGNOSIS — G4700 Insomnia, unspecified: Secondary | ICD-10-CM | POA: Diagnosis not present

## 2022-12-12 DIAGNOSIS — M109 Gout, unspecified: Secondary | ICD-10-CM | POA: Diagnosis not present

## 2022-12-12 DIAGNOSIS — Z7901 Long term (current) use of anticoagulants: Secondary | ICD-10-CM | POA: Diagnosis not present

## 2022-12-12 DIAGNOSIS — K219 Gastro-esophageal reflux disease without esophagitis: Secondary | ICD-10-CM | POA: Diagnosis not present

## 2022-12-12 DIAGNOSIS — Z794 Long term (current) use of insulin: Secondary | ICD-10-CM | POA: Diagnosis not present

## 2022-12-12 DIAGNOSIS — I4891 Unspecified atrial fibrillation: Secondary | ICD-10-CM | POA: Diagnosis not present

## 2022-12-12 DIAGNOSIS — B029 Zoster without complications: Secondary | ICD-10-CM | POA: Diagnosis not present

## 2022-12-12 DIAGNOSIS — K5900 Constipation, unspecified: Secondary | ICD-10-CM | POA: Diagnosis not present

## 2022-12-12 DIAGNOSIS — G4733 Obstructive sleep apnea (adult) (pediatric): Secondary | ICD-10-CM | POA: Diagnosis not present

## 2022-12-12 DIAGNOSIS — Z556 Problems related to health literacy: Secondary | ICD-10-CM | POA: Diagnosis not present

## 2022-12-12 DIAGNOSIS — Z79899 Other long term (current) drug therapy: Secondary | ICD-10-CM | POA: Diagnosis not present

## 2022-12-12 DIAGNOSIS — I110 Hypertensive heart disease with heart failure: Secondary | ICD-10-CM | POA: Diagnosis not present

## 2022-12-12 DIAGNOSIS — I5033 Acute on chronic diastolic (congestive) heart failure: Secondary | ICD-10-CM | POA: Diagnosis not present

## 2022-12-12 DIAGNOSIS — Z87891 Personal history of nicotine dependence: Secondary | ICD-10-CM | POA: Diagnosis not present

## 2022-12-12 DIAGNOSIS — G4700 Insomnia, unspecified: Secondary | ICD-10-CM | POA: Diagnosis not present

## 2022-12-12 DIAGNOSIS — Z9181 History of falling: Secondary | ICD-10-CM | POA: Diagnosis not present

## 2022-12-12 DIAGNOSIS — D649 Anemia, unspecified: Secondary | ICD-10-CM | POA: Diagnosis not present

## 2022-12-12 DIAGNOSIS — Z7984 Long term (current) use of oral hypoglycemic drugs: Secondary | ICD-10-CM | POA: Diagnosis not present

## 2022-12-12 DIAGNOSIS — C220 Liver cell carcinoma: Secondary | ICD-10-CM | POA: Diagnosis not present

## 2022-12-12 DIAGNOSIS — E785 Hyperlipidemia, unspecified: Secondary | ICD-10-CM | POA: Diagnosis not present

## 2022-12-12 DIAGNOSIS — E1140 Type 2 diabetes mellitus with diabetic neuropathy, unspecified: Secondary | ICD-10-CM | POA: Diagnosis not present

## 2022-12-12 DIAGNOSIS — E876 Hypokalemia: Secondary | ICD-10-CM | POA: Diagnosis not present

## 2022-12-17 DIAGNOSIS — E785 Hyperlipidemia, unspecified: Secondary | ICD-10-CM | POA: Diagnosis not present

## 2022-12-17 DIAGNOSIS — Z7984 Long term (current) use of oral hypoglycemic drugs: Secondary | ICD-10-CM | POA: Diagnosis not present

## 2022-12-17 DIAGNOSIS — I4891 Unspecified atrial fibrillation: Secondary | ICD-10-CM | POA: Diagnosis not present

## 2022-12-17 DIAGNOSIS — I110 Hypertensive heart disease with heart failure: Secondary | ICD-10-CM | POA: Diagnosis not present

## 2022-12-17 DIAGNOSIS — K219 Gastro-esophageal reflux disease without esophagitis: Secondary | ICD-10-CM | POA: Diagnosis not present

## 2022-12-17 DIAGNOSIS — D649 Anemia, unspecified: Secondary | ICD-10-CM | POA: Diagnosis not present

## 2022-12-17 DIAGNOSIS — E1140 Type 2 diabetes mellitus with diabetic neuropathy, unspecified: Secondary | ICD-10-CM | POA: Diagnosis not present

## 2022-12-17 DIAGNOSIS — Z556 Problems related to health literacy: Secondary | ICD-10-CM | POA: Diagnosis not present

## 2022-12-17 DIAGNOSIS — I5033 Acute on chronic diastolic (congestive) heart failure: Secondary | ICD-10-CM | POA: Diagnosis not present

## 2022-12-17 DIAGNOSIS — Z794 Long term (current) use of insulin: Secondary | ICD-10-CM | POA: Diagnosis not present

## 2022-12-17 DIAGNOSIS — Z79899 Other long term (current) drug therapy: Secondary | ICD-10-CM | POA: Diagnosis not present

## 2022-12-17 DIAGNOSIS — E876 Hypokalemia: Secondary | ICD-10-CM | POA: Diagnosis not present

## 2022-12-17 DIAGNOSIS — M109 Gout, unspecified: Secondary | ICD-10-CM | POA: Diagnosis not present

## 2022-12-17 DIAGNOSIS — Z9181 History of falling: Secondary | ICD-10-CM | POA: Diagnosis not present

## 2022-12-17 DIAGNOSIS — B029 Zoster without complications: Secondary | ICD-10-CM | POA: Diagnosis not present

## 2022-12-17 DIAGNOSIS — K5900 Constipation, unspecified: Secondary | ICD-10-CM | POA: Diagnosis not present

## 2022-12-17 DIAGNOSIS — Z87891 Personal history of nicotine dependence: Secondary | ICD-10-CM | POA: Diagnosis not present

## 2022-12-17 DIAGNOSIS — C220 Liver cell carcinoma: Secondary | ICD-10-CM | POA: Diagnosis not present

## 2022-12-17 DIAGNOSIS — Z7901 Long term (current) use of anticoagulants: Secondary | ICD-10-CM | POA: Diagnosis not present

## 2022-12-17 DIAGNOSIS — G4733 Obstructive sleep apnea (adult) (pediatric): Secondary | ICD-10-CM | POA: Diagnosis not present

## 2022-12-17 DIAGNOSIS — G4700 Insomnia, unspecified: Secondary | ICD-10-CM | POA: Diagnosis not present

## 2022-12-18 DIAGNOSIS — Z794 Long term (current) use of insulin: Secondary | ICD-10-CM | POA: Diagnosis not present

## 2022-12-18 DIAGNOSIS — E785 Hyperlipidemia, unspecified: Secondary | ICD-10-CM | POA: Diagnosis not present

## 2022-12-18 DIAGNOSIS — Z9181 History of falling: Secondary | ICD-10-CM | POA: Diagnosis not present

## 2022-12-18 DIAGNOSIS — D649 Anemia, unspecified: Secondary | ICD-10-CM | POA: Diagnosis not present

## 2022-12-18 DIAGNOSIS — E1140 Type 2 diabetes mellitus with diabetic neuropathy, unspecified: Secondary | ICD-10-CM | POA: Diagnosis not present

## 2022-12-18 DIAGNOSIS — Z7901 Long term (current) use of anticoagulants: Secondary | ICD-10-CM | POA: Diagnosis not present

## 2022-12-18 DIAGNOSIS — Z7984 Long term (current) use of oral hypoglycemic drugs: Secondary | ICD-10-CM | POA: Diagnosis not present

## 2022-12-18 DIAGNOSIS — Z556 Problems related to health literacy: Secondary | ICD-10-CM | POA: Diagnosis not present

## 2022-12-18 DIAGNOSIS — E876 Hypokalemia: Secondary | ICD-10-CM | POA: Diagnosis not present

## 2022-12-18 DIAGNOSIS — C220 Liver cell carcinoma: Secondary | ICD-10-CM | POA: Diagnosis not present

## 2022-12-18 DIAGNOSIS — I110 Hypertensive heart disease with heart failure: Secondary | ICD-10-CM | POA: Diagnosis not present

## 2022-12-18 DIAGNOSIS — Z79899 Other long term (current) drug therapy: Secondary | ICD-10-CM | POA: Diagnosis not present

## 2022-12-18 DIAGNOSIS — B029 Zoster without complications: Secondary | ICD-10-CM | POA: Diagnosis not present

## 2022-12-18 DIAGNOSIS — I4891 Unspecified atrial fibrillation: Secondary | ICD-10-CM | POA: Diagnosis not present

## 2022-12-18 DIAGNOSIS — K5900 Constipation, unspecified: Secondary | ICD-10-CM | POA: Diagnosis not present

## 2022-12-18 DIAGNOSIS — K219 Gastro-esophageal reflux disease without esophagitis: Secondary | ICD-10-CM | POA: Diagnosis not present

## 2022-12-18 DIAGNOSIS — M109 Gout, unspecified: Secondary | ICD-10-CM | POA: Diagnosis not present

## 2022-12-18 DIAGNOSIS — G4733 Obstructive sleep apnea (adult) (pediatric): Secondary | ICD-10-CM | POA: Diagnosis not present

## 2022-12-18 DIAGNOSIS — I5033 Acute on chronic diastolic (congestive) heart failure: Secondary | ICD-10-CM | POA: Diagnosis not present

## 2022-12-18 DIAGNOSIS — Z87891 Personal history of nicotine dependence: Secondary | ICD-10-CM | POA: Diagnosis not present

## 2022-12-18 DIAGNOSIS — G4700 Insomnia, unspecified: Secondary | ICD-10-CM | POA: Diagnosis not present

## 2022-12-23 DIAGNOSIS — E1140 Type 2 diabetes mellitus with diabetic neuropathy, unspecified: Secondary | ICD-10-CM | POA: Diagnosis not present

## 2022-12-23 DIAGNOSIS — M109 Gout, unspecified: Secondary | ICD-10-CM | POA: Diagnosis not present

## 2022-12-23 DIAGNOSIS — Z794 Long term (current) use of insulin: Secondary | ICD-10-CM | POA: Diagnosis not present

## 2022-12-23 DIAGNOSIS — Z556 Problems related to health literacy: Secondary | ICD-10-CM | POA: Diagnosis not present

## 2022-12-23 DIAGNOSIS — D649 Anemia, unspecified: Secondary | ICD-10-CM | POA: Diagnosis not present

## 2022-12-23 DIAGNOSIS — Z87891 Personal history of nicotine dependence: Secondary | ICD-10-CM | POA: Diagnosis not present

## 2022-12-23 DIAGNOSIS — E785 Hyperlipidemia, unspecified: Secondary | ICD-10-CM | POA: Diagnosis not present

## 2022-12-23 DIAGNOSIS — K219 Gastro-esophageal reflux disease without esophagitis: Secondary | ICD-10-CM | POA: Diagnosis not present

## 2022-12-23 DIAGNOSIS — E876 Hypokalemia: Secondary | ICD-10-CM | POA: Diagnosis not present

## 2022-12-23 DIAGNOSIS — I110 Hypertensive heart disease with heart failure: Secondary | ICD-10-CM | POA: Diagnosis not present

## 2022-12-23 DIAGNOSIS — Z7901 Long term (current) use of anticoagulants: Secondary | ICD-10-CM | POA: Diagnosis not present

## 2022-12-23 DIAGNOSIS — Z79899 Other long term (current) drug therapy: Secondary | ICD-10-CM | POA: Diagnosis not present

## 2022-12-23 DIAGNOSIS — Z7984 Long term (current) use of oral hypoglycemic drugs: Secondary | ICD-10-CM | POA: Diagnosis not present

## 2022-12-23 DIAGNOSIS — G4733 Obstructive sleep apnea (adult) (pediatric): Secondary | ICD-10-CM | POA: Diagnosis not present

## 2022-12-23 DIAGNOSIS — Z9181 History of falling: Secondary | ICD-10-CM | POA: Diagnosis not present

## 2022-12-23 DIAGNOSIS — G4700 Insomnia, unspecified: Secondary | ICD-10-CM | POA: Diagnosis not present

## 2022-12-23 DIAGNOSIS — I5033 Acute on chronic diastolic (congestive) heart failure: Secondary | ICD-10-CM | POA: Diagnosis not present

## 2022-12-23 DIAGNOSIS — B029 Zoster without complications: Secondary | ICD-10-CM | POA: Diagnosis not present

## 2022-12-23 DIAGNOSIS — K5900 Constipation, unspecified: Secondary | ICD-10-CM | POA: Diagnosis not present

## 2022-12-23 DIAGNOSIS — I4891 Unspecified atrial fibrillation: Secondary | ICD-10-CM | POA: Diagnosis not present

## 2022-12-23 DIAGNOSIS — C220 Liver cell carcinoma: Secondary | ICD-10-CM | POA: Diagnosis not present
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
## 2022-12-24 ENCOUNTER — Encounter: Payer: Self-pay | Admitting: Interventional Cardiology

## 2022-12-24 ENCOUNTER — Ambulatory Visit: Payer: Medicare Other | Admitting: Interventional Cardiology

## 2022-12-24 VITALS — BP 118/72 | HR 80 | Ht 71.0 in | Wt 288.6 lb

## 2022-12-24 DIAGNOSIS — R0609 Other forms of dyspnea: Secondary | ICD-10-CM | POA: Diagnosis not present

## 2022-12-24 DIAGNOSIS — I480 Paroxysmal atrial fibrillation: Secondary | ICD-10-CM

## 2022-12-24 DIAGNOSIS — I5032 Chronic diastolic (congestive) heart failure: Secondary | ICD-10-CM | POA: Diagnosis not present

## 2022-12-24 DIAGNOSIS — G4733 Obstructive sleep apnea (adult) (pediatric): Secondary | ICD-10-CM | POA: Diagnosis not present

## 2022-12-24 DIAGNOSIS — R002 Palpitations: Secondary | ICD-10-CM

## 2022-12-24 DIAGNOSIS — I10 Essential (primary) hypertension: Secondary | ICD-10-CM

## 2022-12-24 DIAGNOSIS — I110 Hypertensive heart disease with heart failure: Secondary | ICD-10-CM

## 2022-12-24 NOTE — Patient Instructions (Signed)
Medication Instructions:  Your physician recommends that you continue on your current medications as directed. Please refer to the Current Medication list given to you today.  *If you need a refill on your cardiac medications before your next appointment, please call your pharmacy*   Lab Work: none If you have labs (blood work) drawn today and your tests are completely normal, you will receive your results only by: MyChart Message (if you have MyChart) OR A paper copy in the mail If you have any lab test that is abnormal or we need to change your treatment, we will call you to review the results.   Testing/Procedures: none   Follow-Up: At [facility], you and your health needs are our priority.  As part of our continuing mission to provide you with exceptional heart care, we have created designated Provider Care Teams.  These Care Teams include your primary Cardiologist (physician) and Advanced Practice Providers (APPs -  Physician Assistants and Nurse Practitioners) who all work together to provide you with the care you need, when you need it.  We recommend signing up for the patient portal called "MyChart".  Sign up information is provided on this After Visit Summary.  MyChart is used to connect with patients for Virtual Visits (Telemedicine).  Patients are able to view lab/test results, encounter notes, upcoming appointments, etc.  Non-urgent messages can be sent to your provider as well.   To learn more about what you can do with MyChart, go to [email or web address].    Your next appointment:   6 month(s)  Provider:   Jari Favre, PA-C, Ronie Spies, PA-C, Robin Searing, NP, Jacolyn Reedy, PA-C, Eligha Bridegroom, NP, Tereso Newcomer, PA-C, or Perlie Gold, PA-C         Other Instructions

## 2022-12-25 DIAGNOSIS — M6281 Muscle weakness (generalized): Secondary | ICD-10-CM | POA: Diagnosis not present

## 2022-12-25 DIAGNOSIS — R262 Difficulty in walking, not elsewhere classified: Secondary | ICD-10-CM | POA: Diagnosis not present

## 2022-12-25 DIAGNOSIS — M6259 Muscle wasting and atrophy, not elsewhere classified, multiple sites: Secondary | ICD-10-CM | POA: Diagnosis not present

## 2022-12-28 DIAGNOSIS — E1140 Type 2 diabetes mellitus with diabetic neuropathy, unspecified: Secondary | ICD-10-CM | POA: Diagnosis not present

## 2022-12-29 DIAGNOSIS — E1140 Type 2 diabetes mellitus with diabetic neuropathy, unspecified: Secondary | ICD-10-CM | POA: Diagnosis not present

## 2022-12-29 DIAGNOSIS — B029 Zoster without complications: Secondary | ICD-10-CM | POA: Diagnosis not present

## 2022-12-29 DIAGNOSIS — Z87891 Personal history of nicotine dependence: Secondary | ICD-10-CM | POA: Diagnosis not present

## 2022-12-29 DIAGNOSIS — K219 Gastro-esophageal reflux disease without esophagitis: Secondary | ICD-10-CM | POA: Diagnosis not present

## 2022-12-29 DIAGNOSIS — I5033 Acute on chronic diastolic (congestive) heart failure: Secondary | ICD-10-CM | POA: Diagnosis not present

## 2022-12-29 DIAGNOSIS — E785 Hyperlipidemia, unspecified: Secondary | ICD-10-CM | POA: Diagnosis not present

## 2022-12-29 DIAGNOSIS — Z7984 Long term (current) use of oral hypoglycemic drugs: Secondary | ICD-10-CM | POA: Diagnosis not present

## 2022-12-29 DIAGNOSIS — G4733 Obstructive sleep apnea (adult) (pediatric): Secondary | ICD-10-CM | POA: Diagnosis not present

## 2022-12-29 DIAGNOSIS — I4891 Unspecified atrial fibrillation: Secondary | ICD-10-CM | POA: Diagnosis not present

## 2022-12-29 DIAGNOSIS — Z9181 History of falling: Secondary | ICD-10-CM | POA: Diagnosis not present

## 2022-12-29 DIAGNOSIS — D649 Anemia, unspecified: Secondary | ICD-10-CM | POA: Diagnosis not present

## 2022-12-29 DIAGNOSIS — K5900 Constipation, unspecified: Secondary | ICD-10-CM | POA: Diagnosis not present

## 2022-12-29 DIAGNOSIS — Z794 Long term (current) use of insulin: Secondary | ICD-10-CM | POA: Diagnosis not present

## 2022-12-29 DIAGNOSIS — C220 Liver cell carcinoma: Secondary | ICD-10-CM | POA: Diagnosis not present

## 2022-12-29 DIAGNOSIS — I110 Hypertensive heart disease with heart failure: Secondary | ICD-10-CM | POA: Diagnosis not present

## 2022-12-29 DIAGNOSIS — E876 Hypokalemia: Secondary | ICD-10-CM | POA: Diagnosis not present

## 2022-12-29 DIAGNOSIS — M109 Gout, unspecified: Secondary | ICD-10-CM | POA: Diagnosis not present

## 2022-12-29 DIAGNOSIS — Z556 Problems related to health literacy: Secondary | ICD-10-CM | POA: Diagnosis not present

## 2022-12-29 DIAGNOSIS — G4700 Insomnia, unspecified: Secondary | ICD-10-CM | POA: Diagnosis not present

## 2022-12-29 DIAGNOSIS — Z79899 Other long term (current) drug therapy: Secondary | ICD-10-CM | POA: Diagnosis not present

## 2022-12-29 DIAGNOSIS — Z7901 Long term (current) use of anticoagulants: Secondary | ICD-10-CM | POA: Diagnosis not present

## 2023-01-22 DIAGNOSIS — I5030 Unspecified diastolic (congestive) heart failure: Secondary | ICD-10-CM | POA: Diagnosis not present

## 2023-01-22 DIAGNOSIS — R0602 Shortness of breath: Secondary | ICD-10-CM | POA: Diagnosis not present

## 2023-01-22 DIAGNOSIS — M109 Gout, unspecified: Secondary | ICD-10-CM | POA: Diagnosis not present

## 2023-01-22 DIAGNOSIS — I110 Hypertensive heart disease with heart failure: Secondary | ICD-10-CM | POA: Diagnosis not present

## 2023-01-22 DIAGNOSIS — E782 Mixed hyperlipidemia: Secondary | ICD-10-CM | POA: Diagnosis not present

## 2023-01-22 DIAGNOSIS — E1140 Type 2 diabetes mellitus with diabetic neuropathy, unspecified: Secondary | ICD-10-CM | POA: Diagnosis not present

## 2023-01-22 DIAGNOSIS — I482 Chronic atrial fibrillation, unspecified: Secondary | ICD-10-CM | POA: Diagnosis not present

## 2023-01-22 DIAGNOSIS — M170 Bilateral primary osteoarthritis of knee: Secondary | ICD-10-CM | POA: Diagnosis not present

## 2023-01-30 DIAGNOSIS — E1140 Type 2 diabetes mellitus with diabetic neuropathy, unspecified: Secondary | ICD-10-CM | POA: Diagnosis not present

## 2023-02-19 ENCOUNTER — Other Ambulatory Visit: Payer: Self-pay | Admitting: Nurse Practitioner

## 2023-03-25 ENCOUNTER — Other Ambulatory Visit: Payer: Self-pay | Admitting: Family Medicine

## 2023-03-25 DIAGNOSIS — I714 Abdominal aortic aneurysm, without rupture, unspecified: Secondary | ICD-10-CM

## 2023-03-25 DIAGNOSIS — I110 Hypertensive heart disease with heart failure: Secondary | ICD-10-CM | POA: Diagnosis not present

## 2023-03-25 DIAGNOSIS — K7460 Unspecified cirrhosis of liver: Secondary | ICD-10-CM | POA: Diagnosis not present

## 2023-03-25 DIAGNOSIS — D649 Anemia, unspecified: Secondary | ICD-10-CM | POA: Diagnosis not present

## 2023-03-25 DIAGNOSIS — I480 Paroxysmal atrial fibrillation: Secondary | ICD-10-CM | POA: Diagnosis not present

## 2023-03-25 DIAGNOSIS — Z23 Encounter for immunization: Secondary | ICD-10-CM | POA: Diagnosis not present

## 2023-03-25 DIAGNOSIS — E782 Mixed hyperlipidemia: Secondary | ICD-10-CM | POA: Diagnosis not present

## 2023-03-25 DIAGNOSIS — D692 Other nonthrombocytopenic purpura: Secondary | ICD-10-CM | POA: Diagnosis not present

## 2023-03-25 DIAGNOSIS — Z0000 Encounter for general adult medical examination without abnormal findings: Secondary | ICD-10-CM | POA: Diagnosis not present

## 2023-03-25 DIAGNOSIS — E1140 Type 2 diabetes mellitus with diabetic neuropathy, unspecified: Secondary | ICD-10-CM | POA: Diagnosis not present

## 2023-03-25 DIAGNOSIS — I509 Heart failure, unspecified: Secondary | ICD-10-CM | POA: Diagnosis not present

## 2023-03-25 LAB — LAB REPORT - SCANNED
A1c: 8
Creatinine, POC: 69 mg/dL
EGFR: 43

## 2023-03-26 ENCOUNTER — Encounter: Payer: Self-pay | Admitting: Family Medicine

## 2023-04-10 ENCOUNTER — Ambulatory Visit
Admission: RE | Admit: 2023-04-10 | Discharge: 2023-04-10 | Disposition: A | Discharge status: Home / Self Care | Payer: Medicare Other | Source: Ambulatory Visit | Attending: Family Medicine | Admitting: Family Medicine

## 2023-04-10 DIAGNOSIS — I714 Abdominal aortic aneurysm, without rupture, unspecified: Secondary | ICD-10-CM

## 2023-04-13 DIAGNOSIS — N179 Acute kidney failure, unspecified: Secondary | ICD-10-CM | POA: Diagnosis not present

## 2023-04-29 DIAGNOSIS — Z961 Presence of intraocular lens: Secondary | ICD-10-CM | POA: Diagnosis not present

## 2023-04-29 DIAGNOSIS — H52203 Unspecified astigmatism, bilateral: Secondary | ICD-10-CM | POA: Diagnosis not present

## 2023-04-29 DIAGNOSIS — E119 Type 2 diabetes mellitus without complications: Secondary | ICD-10-CM | POA: Diagnosis not present

## 2023-04-30 DIAGNOSIS — G8929 Other chronic pain: Secondary | ICD-10-CM | POA: Diagnosis not present

## 2023-04-30 DIAGNOSIS — M170 Bilateral primary osteoarthritis of knee: Secondary | ICD-10-CM | POA: Diagnosis not present

## 2023-05-04 DIAGNOSIS — W19XXXA Unspecified fall, initial encounter: Secondary | ICD-10-CM | POA: Diagnosis not present

## 2023-05-04 DIAGNOSIS — I480 Paroxysmal atrial fibrillation: Secondary | ICD-10-CM | POA: Diagnosis not present

## 2023-05-04 DIAGNOSIS — S90812A Abrasion, left foot, initial encounter: Secondary | ICD-10-CM | POA: Diagnosis not present

## 2023-05-04 DIAGNOSIS — N179 Acute kidney failure, unspecified: Secondary | ICD-10-CM | POA: Diagnosis not present

## 2023-05-04 DIAGNOSIS — D6869 Other thrombophilia: Secondary | ICD-10-CM | POA: Diagnosis not present

## 2023-05-24 DIAGNOSIS — E1140 Type 2 diabetes mellitus with diabetic neuropathy, unspecified: Secondary | ICD-10-CM | POA: Diagnosis not present

## 2023-06-08 DIAGNOSIS — K7460 Unspecified cirrhosis of liver: Secondary | ICD-10-CM | POA: Diagnosis not present

## 2023-06-08 DIAGNOSIS — I10 Essential (primary) hypertension: Secondary | ICD-10-CM | POA: Diagnosis not present

## 2023-06-08 DIAGNOSIS — J988 Other specified respiratory disorders: Secondary | ICD-10-CM | POA: Diagnosis not present

## 2023-06-08 DIAGNOSIS — E782 Mixed hyperlipidemia: Secondary | ICD-10-CM | POA: Diagnosis not present

## 2023-06-08 DIAGNOSIS — I480 Paroxysmal atrial fibrillation: Secondary | ICD-10-CM | POA: Diagnosis not present

## 2023-06-08 DIAGNOSIS — I5032 Chronic diastolic (congestive) heart failure: Secondary | ICD-10-CM | POA: Diagnosis not present

## 2023-06-08 DIAGNOSIS — Z789 Other specified health status: Secondary | ICD-10-CM | POA: Diagnosis not present

## 2023-06-08 DIAGNOSIS — E1140 Type 2 diabetes mellitus with diabetic neuropathy, unspecified: Secondary | ICD-10-CM | POA: Diagnosis not present

## 2023-06-18 DIAGNOSIS — I10 Essential (primary) hypertension: Secondary | ICD-10-CM | POA: Diagnosis not present

## 2023-06-18 DIAGNOSIS — L89322 Pressure ulcer of left buttock, stage 2: Secondary | ICD-10-CM | POA: Diagnosis not present

## 2023-06-24 ENCOUNTER — Telehealth: Payer: Self-pay | Admitting: Cardiology

## 2023-06-24 NOTE — Telephone Encounter (Signed)
Patient calling in regards to getting a new cpap machine. Please advise

## 2023-06-24 NOTE — Telephone Encounter (Signed)
Attempted phone call and left voicemail message to contact [phone].

## 2023-06-25 ENCOUNTER — Telehealth: Payer: Self-pay | Admitting: *Deleted

## 2023-06-25 NOTE — Telephone Encounter (Signed)
Patient called to talk with Dr. Mayford Knife or Veda Canning in regards to get a new CPAP machine

## 2023-06-29 ENCOUNTER — Encounter: Payer: Self-pay | Admitting: Physician Assistant

## 2023-06-29 ENCOUNTER — Ambulatory Visit: Payer: Medicare Other | Attending: Physician Assistant | Admitting: Physician Assistant

## 2023-06-29 VITALS — BP 112/60 | HR 67 | Ht 70.0 in | Wt 281.6 lb

## 2023-06-29 DIAGNOSIS — R0609 Other forms of dyspnea: Secondary | ICD-10-CM | POA: Diagnosis not present

## 2023-06-29 DIAGNOSIS — I480 Paroxysmal atrial fibrillation: Secondary | ICD-10-CM | POA: Diagnosis not present

## 2023-06-29 DIAGNOSIS — R002 Palpitations: Secondary | ICD-10-CM

## 2023-06-29 DIAGNOSIS — I110 Hypertensive heart disease with heart failure: Secondary | ICD-10-CM

## 2023-06-29 DIAGNOSIS — G4733 Obstructive sleep apnea (adult) (pediatric): Secondary | ICD-10-CM | POA: Diagnosis not present

## 2023-06-29 DIAGNOSIS — I5032 Chronic diastolic (congestive) heart failure: Secondary | ICD-10-CM | POA: Diagnosis not present

## 2023-06-29 NOTE — Patient Instructions (Signed)
Medication Instructions:  Increase lasix to 40 mg twice a day for 3 days then back to 40 mg a day  *If you need a refill on your cardiac medications before your next appointment, please call your pharmacy*   Lab Work: Have labs with your PCP faxed to [place] [phone].   If you have labs (blood work) drawn today and your tests are completely normal, you will receive your results only by: MyChart Message (if you have MyChart) OR A paper copy in the mail If you have any lab test that is abnormal or we need to change your treatment, we will call you to review the results.   Testing/Procedures: Compression stockings    Follow-Up: At [facility], you and your health needs are our priority.  As part of our continuing mission to provide you with exceptional heart care, we have created designated Provider Care Teams.  These Care Teams include your primary Cardiologist (physician) and Advanced Practice Providers (APPs -  Physician Assistants and Nurse Practitioners) who all work together to provide you with the care you need, when you need it.  We recommend signing up for the patient portal called "MyChart".  Sign up information is provided on this After Visit Summary.  MyChart is used to connect with patients for Virtual Visits (Telemedicine).  Patients are able to view lab/test results, encounter notes, upcoming appointments, etc.  Non-urgent messages can be sent to your provider as well.   To learn more about what you can do with MyChart, go to [email or web address].    Your next appointment:    Dr Cristal Deer  for PCP care next available and DR Mayford Knife for sleep follow up next available
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
## 2023-07-02 ENCOUNTER — Other Ambulatory Visit: Payer: Self-pay | Admitting: Interventional Radiology

## 2023-07-02 DIAGNOSIS — C220 Liver cell carcinoma: Secondary | ICD-10-CM

## 2023-07-03 NOTE — Telephone Encounter (Signed)
 Returned patients call and he states his cpap has died and he needs a new replacement one. Patient was encouraged to call his dme to trouble shoot his problem until he can see the provider in April.

## 2023-07-13 ENCOUNTER — Ambulatory Visit
Admission: RE | Admit: 2023-07-13 | Discharge: 2023-07-13 | Disposition: A | Discharge status: Home / Self Care | Payer: Medicare Other | Source: Ambulatory Visit | Attending: Interventional Radiology | Admitting: Interventional Radiology

## 2023-07-13 DIAGNOSIS — C220 Liver cell carcinoma: Secondary | ICD-10-CM

## 2023-07-13 DIAGNOSIS — I700 Atherosclerosis of aorta: Secondary | ICD-10-CM | POA: Diagnosis not present

## 2023-07-13 DIAGNOSIS — D3501 Benign neoplasm of right adrenal gland: Secondary | ICD-10-CM | POA: Diagnosis not present

## 2023-07-13 MED ORDER — GADOPICLENOL 0.5 MMOL/ML IV SOLN
10.0000 mL | Freq: Once | INTRAVENOUS | Status: AC | PRN
  Administered 2023-07-13: 10 mL via INTRAVENOUS

## 2023-07-14 ENCOUNTER — Ambulatory Visit
Admission: RE | Admit: 2023-07-14 | Discharge: 2023-07-14 | Disposition: A | Discharge status: Home / Self Care | Payer: Medicare Other | Source: Ambulatory Visit | Attending: Interventional Radiology | Admitting: Interventional Radiology

## 2023-07-14 DIAGNOSIS — C220 Liver cell carcinoma: Secondary | ICD-10-CM

## 2023-07-14 NOTE — Progress Notes (Signed)
Chief Complaint: Patient was consulted remotely today (TeleHealth) for follow-up of hepatocellular carcinoma  Referring Physician(s): Morrie Sheldon, [place]  History of Present Illness: Faysal WINDSOR GOEKEN is a 83 y.o. male with biopsy-proven hepatocellular carcinoma. Patient presents for telemedicine visit for surveillance of his hepatocellular carcinoma. Patient has undergone bland embolization of a segment 6 lesion in 2019 and microwave ablation of the lesion in 2020 by Dr. Grace Isaac.  The patient has been receiving surveillance MRI.  Patient had a follow-up MRI on 07/13/2023.  Patient has been essentially asymptomatic except for leg swelling that is being managed with diuretics.  He denies change in weight or appetite.  No shortness of breath, no chest pain and no abdominal pain.  No GI or GU issues.  Patient has not followed up with GI or oncology recently.  Past Medical History:  Diagnosis Date   Anemia    Diabetes mellitus type II, controlled (HCC)    with neuropathy   Dysrhythmia    A-Fib. cardioversion done   GERD (gastroesophageal reflux disease)    Hyperlipidemia    Hypertension    Morbid obesity (HCC)    Neuromuscular disorder (HCC)    feet   OSA (obstructive sleep apnea)    On BiPAP at 15/11cm H2O   Prostate cancer (HCC)    Shingles    on face Nov 2010   Urinary incontinence     Past Surgical History:  Procedure Laterality Date   CARDIOVERSION N/A 09/06/2013   Procedure: CARDIOVERSION;  Surgeon: Corky Crafts, MD;  Location: [facility] ENDOSCOPY;  Service: Cardiovascular;  Laterality: N/A;   EYE SURGERY     cataract surgery bilat; surgery to correct droopy eyelids    FLEXIBLE SIGMOIDOSCOPY N/A 12/24/2021   Procedure: FLEXIBLE SIGMOIDOSCOPY;  Surgeon: Dolores Frame, MD;  Location: [facility] ENDO SUITE;  Service: Gastroenterology;  Laterality: N/A;   IR ANGIOGRAM SELECTIVE EACH ADDITIONAL VESSEL  02/15/2018   IR ANGIOGRAM SELECTIVE EACH ADDITIONAL VESSEL  02/15/2018    IR ANGIOGRAM SELECTIVE EACH ADDITIONAL VESSEL  02/15/2018   IR ANGIOGRAM SELECTIVE EACH ADDITIONAL VESSEL  02/15/2018   IR ANGIOGRAM SELECTIVE EACH ADDITIONAL VESSEL  02/15/2018   IR ANGIOGRAM VISCERAL SELECTIVE  02/15/2018   IR ANGIOGRAM VISCERAL SELECTIVE  02/15/2018   IR EMBO TUMOR ORGAN ISCHEMIA INFARCT INC GUIDE ROADMAPPING  02/15/2018   IR RADIOLOGIST EVAL & MGMT  01/26/2018   IR RADIOLOGIST EVAL & MGMT  03/17/2018   IR RADIOLOGIST EVAL & MGMT  06/29/2018   IR RADIOLOGIST EVAL & MGMT  07/27/2018   IR RADIOLOGIST EVAL & MGMT  10/28/2018   IR RADIOLOGIST EVAL & MGMT  12/14/2018   IR RADIOLOGIST EVAL & MGMT  03/17/2019   IR RADIOLOGIST EVAL & MGMT  07/14/2019   IR RADIOLOGIST EVAL & MGMT  12/27/2019   IR RADIOLOGIST EVAL & MGMT  08/21/2020   IR RADIOLOGIST EVAL & MGMT  02/28/2021   IR RADIOLOGIST EVAL & MGMT  06/25/2021   IR RADIOLOGIST EVAL & MGMT  10/18/2021   IR US GUIDE VASC ACCESS RIGHT  02/15/2018   knee surgery bilat      PROSTATE SURGERY     RADIOLOGY WITH ANESTHESIA N/A 11/24/2018   Procedure: MICROWAVE THERMAL ABLATION LIVER;  Surgeon: Simonne Come, MD;  Location: [facility] ORS;  Service: Anesthesiology;  Laterality: N/A;   right shoulder rotator cuff surgery     TONSILLECTOMY      Allergies: Lipitor [atorvastatin], Simvastatin, Zithromax [azithromycin], and Amlodipine  Medications: Prior to Admission medications  Medication Sig Start Date End Date Taking? Authorizing Provider  acetaminophen (TYLENOL) 500 MG tablet Take 2 tablets (1,000 mg total) by mouth every 8 (eight) hours as needed for mild pain or headache (or Fever >/= 101). 12/27/21   Vassie Loll, MD  allopurinol (ZYLOPRIM) 100 MG tablet Take 100 mg by mouth daily. 04/17/21   [provider]  apixaban (ELIQUIS) 5 MG TABS tablet TAKE 1 TABLET(5 MG) BY MOUTH TWICE DAILY Patient taking differently: Take 5 mg by mouth 2 (two) times daily. 11/15/18   Quintella Reichert, MD  benzonatate (TESSALON) 100 MG capsule Take 100 mg by mouth 3  (three) times daily as needed. 06/08/23   [provider]  dextromethorphan (DELSYM) 30 MG/5ML liquid Take 5 mLs (30 mg total) by mouth 2 (two) times daily as needed for cough. 11/15/22   Johnson, Clanford L, MD  diclofenac Sodium (VOLTAREN) 1 % GEL Apply 4 g topically 4 (four) times daily. 12/27/21   Vassie Loll, MD  diltiazem (CARDIZEM CD) 300 MG 24 hr capsule TAKE 1 CAPSULE BY MOUTH EVERY DAY 02/19/23   Corky Crafts, MD  fish oil-omega-3 fatty acids 1000 MG capsule Take 2 g by mouth 2 (two) times daily.     [provider]  FLUoxetine (PROZAC) 10 MG capsule Take 10 mg by mouth every evening. 04/14/23   [provider]  fluticasone (FLONASE) 50 MCG/ACT nasal spray Place 1 spray into both nostrils daily. 11/23/22   [provider]  furosemide (LASIX) 40 MG tablet Take 40 mg by mouth daily.    [provider]  insulin aspart (NOVOLOG) 100 UNIT/ML injection Inject 5 Units into the skin 3 (three) times daily with meals. GIVE ONLY IF EATS 50% OR MORE OF MEAL. 11/15/22   Johnson, Clanford L, MD  insulin degludec (TRESIBA FLEXTOUCH) 100 UNIT/ML FlexTouch Pen Inject 15 Units into the skin daily.    [provider]  ipratropium-albuterol (DUONEB) 0.5-2.5 (3) MG/3ML SOLN Take 3 mLs by nebulization every 4 (four) hours as needed (wheezing, coughing, SOB). 11/15/22   Johnson, Clanford L, MD  ipratropium-albuterol (DUONEB) 0.5-2.5 (3) MG/3ML SOLN Take 3 mLs by nebulization in the morning and at bedtime. 11/15/22   Johnson, Clanford L, MD  loperamide (IMODIUM) 2 MG capsule Take 1 capsule (2 mg total) by mouth 3 (three) times daily as needed for diarrhea or loose stools. 11/15/22   Johnson, Clanford L, MD  loratadine (CLARITIN) 10 MG tablet Take 10 mg by mouth daily as needed for allergies.     [provider]  melatonin 5 MG TABS Take 5 mg by mouth at bedtime. 11/17/22   [provider]  metFORMIN (GLUCOPHAGE) 1000 MG tablet Take 1,000 mg by  mouth 2 (two) times daily. 06/27/14   [provider]  metoprolol succinate (TOPROL-XL) 50 MG 24 hr tablet Take 1.5 tablets (75 mg total) by mouth daily. 11/15/22   Johnson, Clanford L, MD  omeprazole (PRILOSEC) 20 MG capsule Take 1 capsule (20 mg total) by mouth daily. 11/15/22   Johnson, Clanford L, MD  phenol (CHLORASEPTIC) 1.4 % LIQD Use as directed 1 spray in the mouth or throat as needed for throat irritation / pain. 11/15/22   Johnson, Clanford L, MD  polyethylene glycol (MIRALAX / GLYCOLAX) 17 g packet Take 17 g by mouth daily as needed. 11/15/22   Johnson, Clanford L, MD  potassium chloride SA (KLOR-CON M) 20 MEQ tablet Take 1 tablet (20 mEq total) by mouth daily. 11/15/22  Johnson, Clanford L, MD  pregabalin (LYRICA) 100 MG capsule Take 100 mg by mouth 2 (two) times a day.  09/14/18   [provider]  saccharomyces boulardii (FLORASTOR) 250 MG capsule Take 1 capsule (250 mg total) by mouth 2 (two) times daily. 11/15/22   Johnson, Clanford L, MD  spironolactone (ALDACTONE) 50 MG tablet Take 50 mg by mouth every morning. 03/23/23   [provider]  XULTOPHY 100-3.6 UNIT-MG/ML SOPN 17U Subcutaneous daily (titrate up to Gabrielle of 50U daily) 12/08/22   [provider]  ZETIA 10 MG tablet Take 10 mg by mouth daily.  01/17/13   [provider]     Family History  Problem Relation Age of Onset   Hypertension Brother    Diabetes Brother    Cancer Brother        bladder cancer   Diabetes Brother    CVA Brother    Hypertension Brother    Prostate cancer Brother    Diabetes Brother    Hypertension Brother    Heart disease Brother        CABG   Heart attack Brother    Stroke Brother     Social History   Socioeconomic History   Marital status: Widowed    Spouse name: Not on file   Number of children: Not on file   Years of education: Not on file   Highest education level: Not on file  Occupational History   Not on file  Tobacco Use   Smoking  status: Former    Current packs/day: 0.00    Average packs/day: 1 pack/day for 15.0 years (15.0 ttl pk-yrs)    Types: Cigarettes    Start date: 05/26/1960    Quit date: 05/27/1975    Years since quitting: 48.1   Smokeless tobacco: Never  Vaping Use   Vaping status: Never Used  Substance and Sexual Activity   Alcohol use: No   Drug use: No   Sexual activity: Not on file  Other Topics Concern   Not on file  Social History Narrative   Not on file   Social Drivers of Health   Financial Resource Strain: Not on file  Food Insecurity: No Food Insecurity (11/11/2022)   Hunger Vital Sign    Worried About Running Out of Food in the Last Year: Never true    Ran Out of Food in the Last Year: Never true  Transportation Needs: No Transportation Needs (11/11/2022)   PRAPARE - Administrator, Civil Service (Medical): No    Lack of Transportation (Non-Medical): No  Physical Activity: Not on file  Stress: Not on file  Social Connections: Not on file     Review of Systems  Constitutional:  Negative for activity change, chills, fatigue, fever and unexpected weight change.  Respiratory: Negative.    Cardiovascular:  Positive for leg swelling. Negative for chest pain.  Gastrointestinal: Negative.   Genitourinary: Negative.      Physical Exam No direct physical exam was performed   Vital Signs: There were no vitals taken for this visit.  Imaging: MR ABDOMEN WWO CONTRAST Result Date: 07/14/2023 CLINICAL DATA:  Follow-up of hepatocellular carcinoma. Status post ablation. EXAM: MRI ABDOMEN WITHOUT AND WITH CONTRAST TECHNIQUE: Multiplanar multisequence MR imaging of the abdomen was performed both before and after the administration of intravenous contrast. CONTRAST:  10 cc Vueway COMPARISON:  04/15/2022 FINDINGS: Portions of exam are mild to moderately motion degraded. The pre and postcontrast dynamics are of relatively  high quality. Lower chest: Mild cardiomegaly, without pleural  fluid. Bibasilar pulmonary opacities are favored to represent scarring. Hepatobiliary: The segment 6 ablation defect is again identified, measuring 3.6 x 2.1 cm on 55/14. Along its superior margin, there is arterial hyperenhancement on 54/12 (3.4 x 2.6 cm). This is new, highly suspicious for recurrent disease. A segment 8 subcapsular focus of arterial phase hyperenhancement measures 1.9 cm on 29/12 and is isointense to the remainder of the liver on other pulse sequences. Not readily apparent on the prior, which was significantly motion degraded. Normal gallbladder, without biliary ductal dilatation. Pancreas:  Normal, without mass or ductal dilatation. Spleen:  Normal in size, without focal abnormality. Adrenals/Urinary Tract: Normal left adrenal gland and right kidney. Upper pole subcentimeter left renal cyst . In the absence of clinically indicated signs/symptoms require(s) no independent follow-up. A right adrenal nodule is similar at 2.3 cm and has been determined as indicative an adenoma on prior imaging. Stomach/Bowel: Tiny hiatal hernia. The proximal stomach appears thick walled, but is underdistended included on 33/19. Large colonic stool burden. Normal small bowel. Vascular/Lymphatic: Aortic atherosclerosis. Patent portal splenic veins, without portal venous hypertension. No retroperitoneal or retrocrural adenopathy. Other:  No ascites. Musculoskeletal: T2 hyperintense thoracolumbar vertebral body lesions including on series 3 are unchanged and likely hemangiomas. IMPRESSION: 1. Mild to moderately motion degraded exam. 2. Segment 6 ablation defect with new hyperenhancement along its superior portion. Most consistent with local recurrence and considered LR-TR viable. 3. Segment 8 arterial phase focus of hyperenhancement is considered LR 3 and warrants follow-up with pre and post-contrast MRI at 6 months. 4. Right adrenal adenoma . In the absence of clinically indicated signs/symptoms require(s) no  independent follow-up. 5. Apparent proximal gastric wall thickening could be due to underdistention or gastritis. Tiny hiatal hernia. 6.  Possible constipation. 7.  Aortic Atherosclerosis (ICD10-I70.0). Electronically Signed   By: Jeronimo Greaves M.D.   On: 07/14/2023 09:11    Labs:  CBC: Recent Labs    11/11/22 1210 11/12/22 0416 11/13/22 0442 11/15/22 0526  WBC 7.3 6.8 6.9 7.0  HGB 13.0 13.7 14.0 13.6  HCT 40.2 43.0 44.2 42.6  PLT 272 277 272 300    COAGS: No results for input(s): "INR", "APTT" in the last 8760 hours.  BMP: Recent Labs    11/12/22 0416 11/13/22 0442 11/14/22 0359 11/15/22 0526  NA 138 137 136 137  K 3.4* 3.4* 2.8* 3.9  CL 102 98 100 103  CO2 24 26 23 23   GLUCOSE 115* 146* 146* 161*  BUN 14 17 25* 33*  CALCIUM 8.1* 8.1* 8.2* 8.4*  CREATININE 1.17 1.26* 1.29* 1.28*  GFRNONAA >60 58* 56* 57*    LIVER FUNCTION TESTS: Recent Labs    11/11/22 1210  BILITOT 0.9  AST 21  ALT 24  ALKPHOS 126  PROT 6.2*  ALBUMIN 3.4*    TUMOR MARKERS: No results for input(s): "AFPTM", "CEA", "CA199", "CHROMGRNA" in the last 8760 hours.  Assessment and Plan:  83 year old with history of biopsy-proven hepatocellular carcinoma in segment 6.  Patient has been getting surveillance imaging.  Abdominal MRI from 07/13/2023 demonstrates suspicious hypervascularity just superior to the previously treated lesion in segment 6 and consistent with LR TR viable that measures 3.4 x 2.6 cm.  In addition, there is an arterial phase focus of hyperenhancement in segment 8 suggestive for a LR 3 lesion that measures 1.9 cm.   I also discussed the imaging findings with Dr. Reche Dixon who interpreted the MRI and I do  not think we need a biopsy to confirm the recurrent disease. The segment 6 lesion may be even larger based on the T2 images and could be measuring close to 4.8 cm.  Based on the size of this lesion, I do not feel like this area is good for another percutaneous ablation.  I recommend  catheter directed therapy.  There is also concern for a new lesion in segment 8.  As a result, I recommend evaluation for lobar treatment to the right hepatic lobe with Y90 radioembolization opposed to bland embolization or chemoembolization.  I discussed Y90 radioembolization with the patient and wife.  We discussed how it would require 2 procedures.  The first procedure would include mapping and a test dose with MAA.  We discussed that the second procedure would be the treatment dose and that we he would have certain restrictions after the procedure due to radioactivity.  Patient had a considerable amount of pain and discomfort following the previous bland embolization and percutaneous ablation.  I explained that he could have similar pains after this procedure but typically there is not a lot of pain associated with Y90 radioembolization.  In addition, this procedure would be an outpatient procedure rather than requiring overnight hospitalization.  I did explain that a common symptom after this type of procedure is fatigue.  We also discussed the risks of Y90 radioembolization including nontarget embolization.  I feel that the patient and his wife have very good understanding of the treatment options and they are agreeable with Y90 radioembolization to the right hepatic lobe or right hepatic lesions.  We will schedule the patient for mapping study in the near future.  Patient will need updated labs including a comprehensive metabolic panel and AFP level.   Electronically Signed: Arn Medal 07/14/2023, 2:08 PM   I spent a total 25 minutes in remote  clinical consultation, greater than 50% of which was counseling/coordinating care for hepatocellular carcinoma.    Visit type: Audio only (telephone). Audio (no video) only due to patient preference. Alternative for in-person consultation at [facility]  Patient ID: Steve Jensen, male   DOB: 1940-11-16, 83 y.o.   MRN: [id number]

## 2023-07-16 ENCOUNTER — Telehealth: Payer: Medicare Other

## 2023-07-17 ENCOUNTER — Other Ambulatory Visit: Payer: Self-pay | Admitting: Diagnostic Radiology

## 2023-07-17 DIAGNOSIS — C220 Liver cell carcinoma: Secondary | ICD-10-CM

## 2023-07-20 ENCOUNTER — Telehealth: Payer: Self-pay | Admitting: *Deleted

## 2023-07-20 NOTE — Telephone Encounter (Signed)
   Pre-operative Risk Assessment    Patient Name: Steve Jensen  DOB: December 23, 1940 MRN: [id number]   Date of last office visit: 06/29/23 Jari Favre, [facility] Date of next office visit: 10/20/23 DR. BRIDGETTE CHRISTOPHER   Request for Surgical Clearance    Procedure:   PRE MAPPING AND Y-90 LIVER PROCEDURE  Date of Surgery:  Clearance TBD                                Surgeon:  NOT ASSIGNED YET Surgeon's Group or Practice Name:  [facility] Phone number:  [phone] ([facility] DEPT); ([facility] PH# [phone]) Fax number:  [phone] ([facility] DEPT); ([facility] FAX# [phone])   Type of Clearance Requested:   - Pharmacy:  Hold Apixaban (Eliquis) x 4 DAYS PRIOR   Type of Anesthesia:  Not Indicated   Additional requests/questions:    Elpidio Anis   07/20/2023, 6:16 PM

## 2023-07-21 NOTE — Telephone Encounter (Signed)
 Pharmacy please advise on holding Eliquis prior to PRE MAPPING AND Y-90 LIVER PROCEDURE  scheduled for TBD. Thank you.

## 2023-07-21 NOTE — Telephone Encounter (Signed)
   Patient Name: Steve Jensen  DOB: [birth year] MRN: [id number]  Primary Cardiologist: Lance Muss, MD  Clinical pharmacists have reviewed the patient's past medical history, labs, and current medications as part of preoperative protocol coverage. The following recommendations have been made:  Per office protocol, patient can hold Eliquis for 3 days prior to procedure.     I will route this recommendation to the requesting party via Epic fax function and remove from pre-op pool.  Please call with questions.  Napoleon Form, Leodis Rains, NP 07/21/2023, 3:40 PM

## 2023-07-21 NOTE — Telephone Encounter (Signed)
 Patient with diagnosis of Afib on Eliquis for anticoagulation.    Procedure:  PRE MAPPING AND Y-90 LIVER PROCEDURE  Date of procedure: TBD   CHA2DS2-VASc Score = 5   This indicates a 7.2% annual risk of stroke. The patient's score is based upon: CHF History: 1 HTN History: 1 Diabetes History: 1 Stroke History: 0 Vascular Disease History: 0 Age Score: 2 Gender Score: 0      CrCl 65 mL/min Platelet count 329 K    Per office protocol, patient can hold Eliquis for 3 days prior to procedure.     **This guidance is not considered finalized until pre-operative APP has relayed final recommendations.**

## 2023-07-23 ENCOUNTER — Other Ambulatory Visit (HOSPITAL_COMMUNITY): Payer: Self-pay | Admitting: Diagnostic Radiology

## 2023-07-23 DIAGNOSIS — C220 Liver cell carcinoma: Secondary | ICD-10-CM

## 2023-07-29 ENCOUNTER — Telehealth: Payer: Self-pay | Admitting: *Deleted

## 2023-07-29 NOTE — Telephone Encounter (Signed)
 Spoke with patient's daughter-in-law Meriam Sprague ([place]), to follow-up on CPAP concerns. Meriam Sprague states that they have been able to get the machine working again, so they have not needed to call the DME company. She is aware of pt's upcoming appointment with Dr. Mayford Knife on 08/26/23. She denies questions or concerns at this time.

## 2023-08-03 DIAGNOSIS — G8929 Other chronic pain: Secondary | ICD-10-CM | POA: Diagnosis not present

## 2023-08-03 DIAGNOSIS — M170 Bilateral primary osteoarthritis of knee: Secondary | ICD-10-CM | POA: Diagnosis not present

## 2023-08-07 ENCOUNTER — Other Ambulatory Visit (HOSPITAL_COMMUNITY): Payer: Medicare Other

## 2023-08-19 DIAGNOSIS — K7460 Unspecified cirrhosis of liver: Secondary | ICD-10-CM | POA: Diagnosis not present

## 2023-08-19 DIAGNOSIS — E1140 Type 2 diabetes mellitus with diabetic neuropathy, unspecified: Secondary | ICD-10-CM | POA: Diagnosis not present

## 2023-08-19 DIAGNOSIS — I10 Essential (primary) hypertension: Secondary | ICD-10-CM | POA: Diagnosis not present

## 2023-08-19 DIAGNOSIS — I5032 Chronic diastolic (congestive) heart failure: Secondary | ICD-10-CM | POA: Diagnosis not present

## 2023-08-19 DIAGNOSIS — C220 Liver cell carcinoma: Secondary | ICD-10-CM | POA: Diagnosis not present

## 2023-08-19 DIAGNOSIS — D72829 Elevated white blood cell count, unspecified: Secondary | ICD-10-CM | POA: Diagnosis not present

## 2023-08-19 DIAGNOSIS — E782 Mixed hyperlipidemia: Secondary | ICD-10-CM | POA: Diagnosis not present

## 2023-08-19 DIAGNOSIS — I480 Paroxysmal atrial fibrillation: Secondary | ICD-10-CM | POA: Diagnosis not present

## 2023-08-20 ENCOUNTER — Other Ambulatory Visit (HOSPITAL_COMMUNITY): Payer: Medicare Other

## 2023-08-20 ENCOUNTER — Ambulatory Visit (HOSPITAL_COMMUNITY): Payer: Medicare Other

## 2023-08-24 DIAGNOSIS — I509 Heart failure, unspecified: Secondary | ICD-10-CM | POA: Diagnosis not present

## 2023-08-24 DIAGNOSIS — E782 Mixed hyperlipidemia: Secondary | ICD-10-CM | POA: Diagnosis not present

## 2023-08-24 DIAGNOSIS — I5033 Acute on chronic diastolic (congestive) heart failure: Secondary | ICD-10-CM | POA: Diagnosis not present

## 2023-08-24 DIAGNOSIS — I4891 Unspecified atrial fibrillation: Secondary | ICD-10-CM | POA: Diagnosis not present

## 2023-08-26 ENCOUNTER — Ambulatory Visit: Payer: Medicare Other | Admitting: Cardiology

## 2023-08-27 DIAGNOSIS — E1140 Type 2 diabetes mellitus with diabetic neuropathy, unspecified: Secondary | ICD-10-CM | POA: Diagnosis not present

## 2023-09-03 ENCOUNTER — Emergency Department (HOSPITAL_COMMUNITY)

## 2023-09-03 ENCOUNTER — Encounter (HOSPITAL_COMMUNITY): Payer: Self-pay

## 2023-09-03 ENCOUNTER — Other Ambulatory Visit: Payer: Self-pay

## 2023-09-03 ENCOUNTER — Inpatient Hospital Stay (HOSPITAL_COMMUNITY)
Admission: EM | Admit: 2023-09-03 | Discharge: 2023-09-08 | DRG: 309 | Disposition: A | Discharge status: Skilled Nursing Facility | Attending: Internal Medicine | Admitting: Internal Medicine

## 2023-09-03 DIAGNOSIS — K219 Gastro-esophageal reflux disease without esophagitis: Secondary | ICD-10-CM | POA: Diagnosis not present

## 2023-09-03 DIAGNOSIS — N179 Acute kidney failure, unspecified: Secondary | ICD-10-CM | POA: Diagnosis not present

## 2023-09-03 DIAGNOSIS — N1831 Chronic kidney disease, stage 3a: Secondary | ICD-10-CM | POA: Diagnosis present

## 2023-09-03 DIAGNOSIS — I5032 Chronic diastolic (congestive) heart failure: Secondary | ICD-10-CM | POA: Diagnosis not present

## 2023-09-03 DIAGNOSIS — E875 Hyperkalemia: Secondary | ICD-10-CM

## 2023-09-03 DIAGNOSIS — E1122 Type 2 diabetes mellitus with diabetic chronic kidney disease: Secondary | ICD-10-CM | POA: Diagnosis present

## 2023-09-03 DIAGNOSIS — E860 Dehydration: Secondary | ICD-10-CM | POA: Diagnosis present

## 2023-09-03 DIAGNOSIS — Z8042 Family history of malignant neoplasm of prostate: Secondary | ICD-10-CM

## 2023-09-03 DIAGNOSIS — Z881 Allergy status to other antibiotic agents status: Secondary | ICD-10-CM | POA: Diagnosis not present

## 2023-09-03 DIAGNOSIS — S199XXA Unspecified injury of neck, initial encounter: Secondary | ICD-10-CM | POA: Diagnosis not present

## 2023-09-03 DIAGNOSIS — Z8505 Personal history of malignant neoplasm of liver: Secondary | ICD-10-CM

## 2023-09-03 DIAGNOSIS — I700 Atherosclerosis of aorta: Secondary | ICD-10-CM | POA: Diagnosis not present

## 2023-09-03 DIAGNOSIS — L03115 Cellulitis of right lower limb: Secondary | ICD-10-CM | POA: Diagnosis not present

## 2023-09-03 DIAGNOSIS — W19XXXA Unspecified fall, initial encounter: Principal | ICD-10-CM

## 2023-09-03 DIAGNOSIS — D75839 Thrombocytosis, unspecified: Secondary | ICD-10-CM | POA: Diagnosis not present

## 2023-09-03 DIAGNOSIS — Z66 Do not resuscitate: Secondary | ICD-10-CM | POA: Diagnosis present

## 2023-09-03 DIAGNOSIS — S3993XA Unspecified injury of pelvis, initial encounter: Secondary | ICD-10-CM | POA: Diagnosis not present

## 2023-09-03 DIAGNOSIS — I4821 Permanent atrial fibrillation: Principal | ICD-10-CM | POA: Diagnosis present

## 2023-09-03 DIAGNOSIS — L0390 Cellulitis, unspecified: Secondary | ICD-10-CM

## 2023-09-03 DIAGNOSIS — D72829 Elevated white blood cell count, unspecified: Secondary | ICD-10-CM | POA: Diagnosis not present

## 2023-09-03 DIAGNOSIS — Z8546 Personal history of malignant neoplasm of prostate: Secondary | ICD-10-CM

## 2023-09-03 DIAGNOSIS — Z87891 Personal history of nicotine dependence: Secondary | ICD-10-CM | POA: Diagnosis not present

## 2023-09-03 DIAGNOSIS — Z888 Allergy status to other drugs, medicaments and biological substances status: Secondary | ICD-10-CM

## 2023-09-03 DIAGNOSIS — G4733 Obstructive sleep apnea (adult) (pediatric): Secondary | ICD-10-CM | POA: Diagnosis not present

## 2023-09-03 DIAGNOSIS — Z7901 Long term (current) use of anticoagulants: Secondary | ICD-10-CM

## 2023-09-03 DIAGNOSIS — I4811 Longstanding persistent atrial fibrillation: Secondary | ICD-10-CM | POA: Diagnosis not present

## 2023-09-03 DIAGNOSIS — Z743 Need for continuous supervision: Secondary | ICD-10-CM | POA: Diagnosis not present

## 2023-09-03 DIAGNOSIS — R569 Unspecified convulsions: Secondary | ICD-10-CM | POA: Diagnosis not present

## 2023-09-03 DIAGNOSIS — N289 Disorder of kidney and ureter, unspecified: Secondary | ICD-10-CM

## 2023-09-03 DIAGNOSIS — E119 Type 2 diabetes mellitus without complications: Secondary | ICD-10-CM

## 2023-09-03 DIAGNOSIS — D6869 Other thrombophilia: Secondary | ICD-10-CM | POA: Diagnosis present

## 2023-09-03 DIAGNOSIS — S299XXA Unspecified injury of thorax, initial encounter: Secondary | ICD-10-CM | POA: Diagnosis not present

## 2023-09-03 DIAGNOSIS — I6523 Occlusion and stenosis of bilateral carotid arteries: Secondary | ICD-10-CM | POA: Diagnosis not present

## 2023-09-03 DIAGNOSIS — I130 Hypertensive heart and chronic kidney disease with heart failure and stage 1 through stage 4 chronic kidney disease, or unspecified chronic kidney disease: Secondary | ICD-10-CM | POA: Diagnosis not present

## 2023-09-03 DIAGNOSIS — S0990XA Unspecified injury of head, initial encounter: Secondary | ICD-10-CM

## 2023-09-03 DIAGNOSIS — R9089 Other abnormal findings on diagnostic imaging of central nervous system: Secondary | ICD-10-CM | POA: Diagnosis not present

## 2023-09-03 DIAGNOSIS — Z7984 Long term (current) use of oral hypoglycemic drugs: Secondary | ICD-10-CM

## 2023-09-03 DIAGNOSIS — Z1152 Encounter for screening for COVID-19: Secondary | ICD-10-CM | POA: Diagnosis not present

## 2023-09-03 DIAGNOSIS — E785 Hyperlipidemia, unspecified: Secondary | ICD-10-CM | POA: Diagnosis not present

## 2023-09-03 DIAGNOSIS — Z8052 Family history of malignant neoplasm of bladder: Secondary | ICD-10-CM

## 2023-09-03 DIAGNOSIS — Z794 Long term (current) use of insulin: Secondary | ICD-10-CM

## 2023-09-03 DIAGNOSIS — I480 Paroxysmal atrial fibrillation: Secondary | ICD-10-CM | POA: Diagnosis present

## 2023-09-03 DIAGNOSIS — R55 Syncope and collapse: Secondary | ICD-10-CM

## 2023-09-03 DIAGNOSIS — Z6841 Body Mass Index (BMI) 40.0 and over, adult: Secondary | ICD-10-CM

## 2023-09-03 DIAGNOSIS — Z79899 Other long term (current) drug therapy: Secondary | ICD-10-CM | POA: Diagnosis not present

## 2023-09-03 DIAGNOSIS — Z8249 Family history of ischemic heart disease and other diseases of the circulatory system: Secondary | ICD-10-CM

## 2023-09-03 DIAGNOSIS — R410 Disorientation, unspecified: Secondary | ICD-10-CM | POA: Diagnosis not present

## 2023-09-03 DIAGNOSIS — I951 Orthostatic hypotension: Secondary | ICD-10-CM | POA: Diagnosis present

## 2023-09-03 DIAGNOSIS — Z823 Family history of stroke: Secondary | ICD-10-CM

## 2023-09-03 DIAGNOSIS — M545 Low back pain, unspecified: Secondary | ICD-10-CM | POA: Diagnosis not present

## 2023-09-03 DIAGNOSIS — I4891 Unspecified atrial fibrillation: Secondary | ICD-10-CM | POA: Diagnosis present

## 2023-09-03 DIAGNOSIS — Z604 Social exclusion and rejection: Secondary | ICD-10-CM | POA: Diagnosis present

## 2023-09-03 DIAGNOSIS — Z833 Family history of diabetes mellitus: Secondary | ICD-10-CM

## 2023-09-03 LAB — COMPREHENSIVE METABOLIC PANEL WITH GFR
ALT: 20 U/L (ref 0–44)
AST: 31 U/L (ref 15–41)
Albumin: 3.1 g/dL — ABNORMAL LOW (ref 3.5–5.0)
Alkaline Phosphatase: 91 U/L (ref 38–126)
Anion gap: 13 (ref 5–15)
BUN: 39 mg/dL — ABNORMAL HIGH (ref 8–23)
CO2: 23 mmol/L (ref 22–32)
Calcium: 9.5 mg/dL (ref 8.9–10.3)
Chloride: 99 mmol/L (ref 98–111)
Creatinine, Ser: 2.12 mg/dL — ABNORMAL HIGH (ref 0.61–1.24)
GFR, Estimated: 31 mL/min — ABNORMAL LOW (ref >60)
Glucose, Bld: 184 mg/dL — ABNORMAL HIGH (ref 70–99)
Potassium: 6.3 mmol/L (ref 3.5–5.1)
Sodium: 135 mmol/L (ref 135–145)
Total Bilirubin: 0.4 mg/dL (ref 0.0–1.2)
Total Protein: 7 g/dL (ref 6.5–8.1)

## 2023-09-03 LAB — CBC
HCT: 44.5 % (ref 39.0–52.0)
Hemoglobin: 14.3 g/dL (ref 13.0–17.0)
MCH: 26.5 pg (ref 26.0–34.0)
MCHC: 32.1 g/dL (ref 30.0–36.0)
MCV: 82.4 fL (ref 80.0–100.0)
Platelets: 509 10*3/uL — ABNORMAL HIGH (ref 150–400)
RBC: 5.4 MIL/uL (ref 4.22–5.81)
RDW: 17.7 % — ABNORMAL HIGH (ref 11.5–15.5)
WBC: 19.5 10*3/uL — ABNORMAL HIGH (ref 4.0–10.5)
nRBC: 0 % (ref 0.0–0.2)

## 2023-09-03 LAB — BRAIN NATRIURETIC PEPTIDE: B Natriuretic Peptide: 152.3 pg/mL — ABNORMAL HIGH (ref 0.0–100.0)

## 2023-09-03 LAB — PROTIME-INR
INR: 1.3 — ABNORMAL HIGH (ref 0.8–1.2)
Prothrombin Time: 15.9 s — ABNORMAL HIGH (ref 11.4–15.2)

## 2023-09-03 LAB — ETHANOL: Alcohol, Ethyl (B): <10 mg/dL (ref <10)

## 2023-09-03 MED ORDER — SODIUM ZIRCONIUM CYCLOSILICATE 10 G PO PACK
10.0000 g | PACK | Freq: Once | ORAL | Status: AC
Start: 2023-09-03 — End: 2023-09-03
  Administered 2023-09-03: 10 g via ORAL
  Filled 2023-09-03: qty 1

## 2023-09-03 MED ORDER — CALCIUM GLUCONATE-NACL 1-0.675 GM/50ML-% IV SOLN
1.0000 g | Freq: Once | INTRAVENOUS | Status: AC
Start: 2023-09-03 — End: 2023-09-04
  Administered 2023-09-03: 1000 mg via INTRAVENOUS
  Filled 2023-09-03: qty 50

## 2023-09-03 MED ORDER — SODIUM CHLORIDE 0.9 % IV BOLUS
500.0000 mL | Freq: Once | INTRAVENOUS | Status: AC
  Administered 2023-09-03: 500 mL via INTRAVENOUS

## 2023-09-03 MED ORDER — DEXTROSE 50 % IV SOLN
1.0000 | Freq: Once | INTRAVENOUS | Status: AC
  Administered 2023-09-03: 50 mL via INTRAVENOUS
  Filled 2023-09-03: qty 50

## 2023-09-03 MED ORDER — INSULIN ASPART 100 UNIT/ML IJ SOLN
5.0000 [IU] | Freq: Once | INTRAMUSCULAR | Status: AC
  Administered 2023-09-03: 5 [IU] via INTRAVENOUS

## 2023-09-03 MED ORDER — ALBUTEROL SULFATE (2.5 MG/3ML) 0.083% IN NEBU
10.0000 mg | INHALATION_SOLUTION | Freq: Once | RESPIRATORY_TRACT | Status: AC
  Administered 2023-09-03: 10 mg via RESPIRATORY_TRACT
  Filled 2023-09-03: qty 12

## 2023-09-03 NOTE — ED Triage Notes (Signed)
 Pt fell today and hit the back of his head on the floor. Alert and oriented, but does not remember the fall. No obvious injury. Patient denies pain.

## 2023-09-03 NOTE — Progress Notes (Signed)
   09/03/23 2022  Spiritual Encounters  Type of Visit Initial  Care provided to: Patient  Referral source Trauma page  Reason for visit Trauma  OnCall Visit No   Visited patient in response to level 2 trauma. Patient returned from x-rays and CT scan of head and neck, however patient now complaining oif back pain. Patient asked for a towel to be rolled up and placed in curve of back. Patient also reports he has not yet had any pain medication. Spoke with nurse and advised of back pain. Nurse stated that she will provide patient with rolled towel. Nurse also reported that the was working on getting his medication. Advised the patient of nurse's intention while providing spiritual care to patient.

## 2023-09-03 NOTE — ED Provider Notes (Signed)
 [place] EMERGENCY DEPARTMENT AT [facility] Provider Note   CSN: [id number] Arrival date & time: 09/03/23  2043     History  Chief Complaint  Patient presents with   Steve Jensen is a 83 y.o. male.  HPI Patient presents via EMS after trauma.  Patient is anticoagulated with Eliquis, history of A-fib, heart failure.  Patient fell today, struck the back of his head.  Unclear loss of consciousness, patient does not recall the fall.  EMS reports the patient was awake, alert, interactive throughout transport, hemodynamically stable.  He notes that yesterday he was seen and evaluated by EMS in his house, and plan was for referral for evaluation due to history of heart failure and pulmonary congestion.  Patient himself currently denies dyspnea.    Home Medications Prior to Admission medications   Medication Sig Start Date End Date Taking? Authorizing Provider  acetaminophen (TYLENOL) 500 MG tablet Take 2 tablets (1,000 mg total) by mouth every 8 (eight) hours as needed for mild pain or headache (or Fever >/= 101). 12/27/21   Vassie Loll, MD  allopurinol (ZYLOPRIM) 100 MG tablet Take 100 mg by mouth daily. 04/17/21   [provider]  apixaban (ELIQUIS) 5 MG TABS tablet TAKE 1 TABLET(5 MG) BY MOUTH TWICE DAILY Patient taking differently: Take 5 mg by mouth 2 (two) times daily. 11/15/18   Quintella Reichert, MD  benzonatate (TESSALON) 100 MG capsule Take 100 mg by mouth 3 (three) times daily as needed. 06/08/23   [provider]  dextromethorphan (DELSYM) 30 MG/5ML liquid Take 5 mLs (30 mg total) by mouth 2 (two) times daily as needed for cough. 11/15/22   Johnson, Clanford L, MD  diclofenac Sodium (VOLTAREN) 1 % GEL Apply 4 g topically 4 (four) times daily. 12/27/21   Vassie Loll, MD  diltiazem (CARDIZEM CD) 300 MG 24 hr capsule TAKE 1 CAPSULE BY MOUTH EVERY DAY 02/19/23   Corky Crafts, MD  fish oil-omega-3 fatty acids 1000 MG capsule Take 2 g by  mouth 2 (two) times daily.     [provider]  FLUoxetine (PROZAC) 10 MG capsule Take 10 mg by mouth every evening. 04/14/23   [provider]  fluticasone (FLONASE) 50 MCG/ACT nasal spray Place 1 spray into both nostrils daily. 11/23/22   [provider]  furosemide (LASIX) 40 MG tablet Take 40 mg by mouth daily.    [provider]  insulin aspart (NOVOLOG) 100 UNIT/ML injection Inject 5 Units into the skin 3 (three) times daily with meals. GIVE ONLY IF EATS 50% OR MORE OF MEAL. 11/15/22   Johnson, Clanford L, MD  insulin degludec (TRESIBA FLEXTOUCH) 100 UNIT/ML FlexTouch Pen Inject 15 Units into the skin daily.    [provider]  ipratropium-albuterol (DUONEB) 0.5-2.5 (3) MG/3ML SOLN Take 3 mLs by nebulization every 4 (four) hours as needed (wheezing, coughing, SOB). 11/15/22   Johnson, Clanford L, MD  ipratropium-albuterol (DUONEB) 0.5-2.5 (3) MG/3ML SOLN Take 3 mLs by nebulization in the morning and at bedtime. 11/15/22   Johnson, Clanford L, MD  loperamide (IMODIUM) 2 MG capsule Take 1 capsule (2 mg total) by mouth 3 (three) times daily as needed for diarrhea or loose stools. 11/15/22   Johnson, Clanford L, MD  loratadine (CLARITIN) 10 MG tablet Take 10 mg by mouth daily as needed for allergies.     [provider]  melatonin 5 MG TABS Take 5 mg by mouth at bedtime. 11/17/22  [provider]  metFORMIN (GLUCOPHAGE) 1000 MG tablet Take 1,000 mg by mouth 2 (two) times daily. 06/27/14   [provider]  metoprolol succinate (TOPROL-XL) 50 MG 24 hr tablet Take 1.5 tablets (75 mg total) by mouth daily. 11/15/22   Johnson, Clanford L, MD  omeprazole (PRILOSEC) 20 MG capsule Take 1 capsule (20 mg total) by mouth daily. 11/15/22   Johnson, Clanford L, MD  phenol (CHLORASEPTIC) 1.4 % LIQD Use as directed 1 spray in the mouth or throat as needed for throat irritation / pain. 11/15/22   Johnson, Clanford L, MD  polyethylene glycol (MIRALAX /  GLYCOLAX) 17 g packet Take 17 g by mouth daily as needed. 11/15/22   Johnson, Clanford L, MD  potassium chloride SA (KLOR-CON M) 20 MEQ tablet Take 1 tablet (20 mEq total) by mouth daily. 11/15/22   Johnson, Clanford L, MD  pregabalin (LYRICA) 100 MG capsule Take 100 mg by mouth 2 (two) times a day.  09/14/18   [provider]  saccharomyces boulardii (FLORASTOR) 250 MG capsule Take 1 capsule (250 mg total) by mouth 2 (two) times daily. 11/15/22   Johnson, Clanford L, MD  spironolactone (ALDACTONE) 50 MG tablet Take 50 mg by mouth every morning. 03/23/23   [provider]  XULTOPHY 100-3.6 UNIT-MG/ML SOPN 17U Subcutaneous daily (titrate up to Treg of 50U daily) 12/08/22   [provider]  ZETIA 10 MG tablet Take 10 mg by mouth daily.  01/17/13   [provider]      Allergies    Lipitor [atorvastatin], Simvastatin, Zithromax [azithromycin], and Amlodipine    Review of Systems   Review of Systems  Physical Exam Updated Vital Signs BP (!) 114/90   Pulse 68   Temp 98.1 F (36.7 C)   Resp 18   Ht 5\' 10"  (1.778 m)   Wt 127.5 kg   SpO2 98%   BMI 40.32 kg/m  Physical Exam Vitals and nursing note reviewed.  Constitutional:      General: He is not in acute distress.    Appearance: He is well-developed. He is obese. He is not ill-appearing or diaphoretic.     Comments: Obese adult male cervical collar in place  HENT:     Head: Normocephalic and atraumatic.  Eyes:     Conjunctiva/sclera: Conjunctivae normal.  Cardiovascular:     Rate and Rhythm: Normal rate and regular rhythm.  Pulmonary:     Effort: Pulmonary effort is normal. No respiratory distress.     Breath sounds: No stridor.  Abdominal:     General: There is no distension.  Skin:    General: Skin is warm and dry.  Neurological:     Mental Status: He is alert and oriented to person, place, and time.     Comments: No obvious focal deficits     ED Results / Procedures / Treatments    Labs (all labs ordered are listed, but only abnormal results are displayed) Labs Reviewed  COMPREHENSIVE METABOLIC PANEL WITH GFR - Abnormal; Notable for the following components:      Result Value   Potassium 6.3 (*)    Glucose, Bld 184 (*)    BUN 39 (*)    Creatinine, Ser 2.12 (*)    Albumin 3.1 (*)    GFR, Estimated 31 (*)    All other components within normal limits  CBC - Abnormal; Notable for the following components:   WBC 19.5 (*)    RDW 17.7 (*)  Platelets 509 (*)    All other components within normal limits  PROTIME-INR - Abnormal; Notable for the following components:   Prothrombin Time 15.9 (*)    INR 1.3 (*)    All other components within normal limits  BRAIN NATRIURETIC PEPTIDE - Abnormal; Notable for the following components:   B Natriuretic Peptide 152.3 (*)    All other components within normal limits  ETHANOL  URINALYSIS, ROUTINE W REFLEX MICROSCOPIC    EKG EKG Interpretation Date/Time:  Thursday September 03 2023 21:02:03 EDT Ventricular Rate:  63 PR Interval:    QRS Duration:  103 QT Interval:  440 QTC Calculation: 451 R Axis:   95  Text Interpretation: Atrial fibrillation Artifact Abnormal ECG Confirmed by Gerhard Munch [phone]) on 09/03/2023 10:03:43 PM  Radiology DG Chest Port 1 View Result Date: 09/03/2023 CLINICAL DATA:  Trauma EXAM: PORTABLE CHEST 1 VIEW COMPARISON:  Chest x-ray 11/13/2022 FINDINGS: The heart and mediastinal contours are unchanged. Atherosclerotic plaque. No focal consolidation. No pulmonary edema. No pleural effusion. No pneumothorax. No acute osseous abnormality. IMPRESSION: 1. No active disease. 2.  Aortic Atherosclerosis (ICD10-I70.0). Electronically Signed   By: Tish Frederickson M.D.   On: 09/03/2023 22:43   DG Pelvis Portable Result Date: 09/03/2023 CLINICAL DATA:  Trauma EXAM: PORTABLE PELVIS 1-2 VIEWS COMPARISON:  X-ray pelvis 12/23/2021 FINDINGS: Right femoral trochanter collimated off view. No acute displaced fracture or  dislocation of the hips. There is no evidence of pelvic fracture or diastasis. No pelvic bone lesions are seen. Surgical clips overlie the pelvis. IMPRESSION: 1.  Negative for acute traumatic injury. 2. Right femoral trochanter collimated off view. Electronically Signed   By: Tish Frederickson M.D.   On: 09/03/2023 22:42   CT HEAD WO CONTRAST Result Date: 09/03/2023 CLINICAL DATA:  Pt fell today and hit the back of his head on the floor. Alert and oriented, but does not remember the fall. No obvious injury. Patient denies pain. EXAM: CT HEAD WITHOUT CONTRAST CT CERVICAL SPINE WITHOUT CONTRAST TECHNIQUE: Multidetector CT imaging of the head and cervical spine was performed following the standard protocol without intravenous contrast. Multiplanar CT image reconstructions of the cervical spine were also generated. RADIATION DOSE REDUCTION: This exam was performed according to the departmental dose-optimization program which includes automated exposure control, adjustment of the mA and/or kV according to patient size and/or use of iterative reconstruction technique. COMPARISON:  CT head 11/11/2022 FINDINGS: CT HEAD FINDINGS Brain: Cerebral ventricle sizes are concordant with the degree of cerebral volume loss. Patchy and confluent areas of decreased attenuation are noted throughout the deep and periventricular white matter of the cerebral hemispheres bilaterally, compatible with chronic microvascular ischemic disease. No evidence of large-territorial acute infarction. No parenchymal hemorrhage. No mass lesion. No extra-axial collection. No mass effect or midline shift. No hydrocephalus. Basilar cisterns are patent. Vascular: No hyperdense vessel. Atherosclerotic calcifications are present within the cavernous internal carotid arteries. Skull: No acute fracture or focal lesion. Sinuses/Orbits: Paranasal sinuses and mastoid air cells are clear. Bilateral lens replacement. Otherwise the orbits are unremarkable. Other:  Left nasolacrimal duct enlargement (3:10). CT CERVICAL SPINE FINDINGS Alignment: Normal. Skull base and vertebrae: Multilevel moderate to severe degenerative changes spine with associated at least moderate left C4-C5 osseous neural foraminal stenosis. No severe osseous central canal stenosis. + No acute fracture. No aggressive appearing focal osseous lesion or focal pathologic process. Soft tissues and spinal canal: No prevertebral fluid or swelling. No visible canal hematoma. Upper chest: Unremarkable. Other: Atherosclerotic plaque of the aortic  arch and its main branches. IMPRESSION: 1. No acute intracranial abnormality. 2. No acute displaced fracture or traumatic listhesis of the cervical spine. 3. Indeterminate left nasolacrimal duct enlargement. Correlate with clinical exam. 4.  Aortic Atherosclerosis (ICD10-I70.0). Electronically Signed   By: Tish Frederickson M.D.   On: 09/03/2023 22:40   CT CERVICAL SPINE WO CONTRAST Result Date: 09/03/2023 CLINICAL DATA:  Pt fell today and hit the back of his head on the floor. Alert and oriented, but does not remember the fall. No obvious injury. Patient denies pain. EXAM: CT HEAD WITHOUT CONTRAST CT CERVICAL SPINE WITHOUT CONTRAST TECHNIQUE: Multidetector CT imaging of the head and cervical spine was performed following the standard protocol without intravenous contrast. Multiplanar CT image reconstructions of the cervical spine were also generated. RADIATION DOSE REDUCTION: This exam was performed according to the departmental dose-optimization program which includes automated exposure control, adjustment of the mA and/or kV according to patient size and/or use of iterative reconstruction technique. COMPARISON:  CT head 11/11/2022 FINDINGS: CT HEAD FINDINGS Brain: Cerebral ventricle sizes are concordant with the degree of cerebral volume loss. Patchy and confluent areas of decreased attenuation are noted throughout the deep and periventricular white matter of the  cerebral hemispheres bilaterally, compatible with chronic microvascular ischemic disease. No evidence of large-territorial acute infarction. No parenchymal hemorrhage. No mass lesion. No extra-axial collection. No mass effect or midline shift. No hydrocephalus. Basilar cisterns are patent. Vascular: No hyperdense vessel. Atherosclerotic calcifications are present within the cavernous internal carotid arteries. Skull: No acute fracture or focal lesion. Sinuses/Orbits: Paranasal sinuses and mastoid air cells are clear. Bilateral lens replacement. Otherwise the orbits are unremarkable. Other: Left nasolacrimal duct enlargement (3:10). CT CERVICAL SPINE FINDINGS Alignment: Normal. Skull base and vertebrae: Multilevel moderate to severe degenerative changes spine with associated at least moderate left C4-C5 osseous neural foraminal stenosis. No severe osseous central canal stenosis. + No acute fracture. No aggressive appearing focal osseous lesion or focal pathologic process. Soft tissues and spinal canal: No prevertebral fluid or swelling. No visible canal hematoma. Upper chest: Unremarkable. Other: Atherosclerotic plaque of the aortic arch and its main branches. IMPRESSION: 1. No acute intracranial abnormality. 2. No acute displaced fracture or traumatic listhesis of the cervical spine. 3. Indeterminate left nasolacrimal duct enlargement. Correlate with clinical exam. 4.  Aortic Atherosclerosis (ICD10-I70.0). Electronically Signed   By: Tish Frederickson M.D.   On: 09/03/2023 22:40    Procedures Procedures    Medications Ordered in ED Medications  calcium gluconate 1 g/ 50 mL sodium chloride IVPB (has no administration in time range)  sodium chloride 0.9 % bolus 500 mL (500 mLs Intravenous New Bag/Given 09/03/23 2245)  sodium zirconium cyclosilicate (LOKELMA) packet 10 g (10 g Oral Given 09/03/23 2227)  albuterol (PROVENTIL) (2.5 MG/3ML) 0.083% nebulizer solution 10 mg (10 mg Nebulization Given 09/03/23 2247)   dextrose 50 % solution 50 mL (50 mLs Intravenous Given 09/03/23 2227)    ED Course/ Medical Decision Making/ A&P                                 Medical Decision Making Adult male anticoagulated presents after head trauma following a fall.  Patient is awake and alert.  Concern for intracranial abnormality, fracture, and given his history of heart failure, multiple medical problems, labs sent x-ray CT ordered.  Amount and/or Complexity of Data Reviewed Independent Historian: EMS External Data Reviewed: notes. Labs: ordered. Decision-making details documented in  ED Course. Radiology: ordered and independent interpretation performed. Decision-making details documented in ED Course. ECG/medicine tests: ordered and independent interpretation performed. Decision-making details documented in ED Course.  Risk Prescription drug management.  Update: Patient's labs notable for hyperkalemia, with worsening renal function.  EKG essentially unchanged and he has no new chest pain, nor dyspnea.  Patient will receive hyperkalemia meds 10:56 PM Patient with no fracture on x-ray, CT, no intracranial hemorrhage, cervical collar removed.  He remains hemodynamically stable, A-fib, rate controlled 80s.  Given concern for fall, questionable syncope, head trauma, in the context of the patient with A-fib, anticoagulated with heart failure now found to have worsening renal dysfunction as well as hyperkalemia patient required interventions as above, for stabilization and monitoring with admission.  Final Clinical Impression(s) / ED Diagnoses Final diagnoses:  Fall, initial encounter  Injury of head, initial encounter  Renal dysfunction  Hyperkalemia   CRITICAL CARE Performed by: Gerhard Munch Total critical care time: 35 minutes Critical care time was exclusive of separately billable procedures and treating other patients. Critical care was necessary to treat or prevent imminent or life-threatening  deterioration. Critical care was time spent personally by me on the following activities: development of treatment plan with patient and/or surrogate as well as nursing, discussions with consultants, evaluation of patient's response to treatment, examination of patient, obtaining history from patient or surrogate, ordering and performing treatments and interventions, ordering and review of laboratory studies, ordering and review of radiographic studies, pulse oximetry and re-evaluation of patient's condition.    Gerhard Munch, MD 09/03/23 2300

## 2023-09-04 ENCOUNTER — Encounter (HOSPITAL_COMMUNITY): Payer: Self-pay | Admitting: Internal Medicine

## 2023-09-04 ENCOUNTER — Observation Stay (HOSPITAL_COMMUNITY)

## 2023-09-04 DIAGNOSIS — I4811 Longstanding persistent atrial fibrillation: Secondary | ICD-10-CM

## 2023-09-04 DIAGNOSIS — I5032 Chronic diastolic (congestive) heart failure: Secondary | ICD-10-CM

## 2023-09-04 DIAGNOSIS — Z7901 Long term (current) use of anticoagulants: Secondary | ICD-10-CM

## 2023-09-04 DIAGNOSIS — E875 Hyperkalemia: Secondary | ICD-10-CM

## 2023-09-04 DIAGNOSIS — R569 Unspecified convulsions: Secondary | ICD-10-CM

## 2023-09-04 DIAGNOSIS — N179 Acute kidney failure, unspecified: Secondary | ICD-10-CM | POA: Diagnosis not present

## 2023-09-04 DIAGNOSIS — R55 Syncope and collapse: Secondary | ICD-10-CM | POA: Diagnosis not present

## 2023-09-04 DIAGNOSIS — L0390 Cellulitis, unspecified: Secondary | ICD-10-CM

## 2023-09-04 DIAGNOSIS — W19XXXA Unspecified fall, initial encounter: Secondary | ICD-10-CM

## 2023-09-04 LAB — ECHOCARDIOGRAM COMPLETE
AR max vel: 4.03 cm2
AV Area VTI: 3.75 cm2
AV Area mean vel: 3.72 cm2
AV Mean grad: 3 mmHg
AV Peak grad: 5.1 mmHg
Ao pk vel: 1.13 m/s
Area-P 1/2: 4.68 cm2
Height: 70 in
S' Lateral: 2.9 cm
Weight: 4496 [oz_av]

## 2023-09-04 LAB — RESPIRATORY PANEL BY PCR

## 2023-09-04 LAB — CBG MONITORING, ED
Glucose-Capillary: 128 mg/dL — ABNORMAL HIGH (ref 70–99)
Glucose-Capillary: 142 mg/dL — ABNORMAL HIGH (ref 70–99)

## 2023-09-04 LAB — CBC
HCT: 43.5 % (ref 39.0–52.0)
Hemoglobin: 13.2 g/dL (ref 13.0–17.0)
MCH: 26.5 pg (ref 26.0–34.0)
MCHC: 30.3 g/dL (ref 30.0–36.0)
MCV: 87.2 fL (ref 80.0–100.0)
Platelets: 442 10*3/uL — ABNORMAL HIGH (ref 150–400)
RBC: 4.99 MIL/uL (ref 4.22–5.81)
RDW: 17.6 % — ABNORMAL HIGH (ref 11.5–15.5)
WBC: 18.6 10*3/uL — ABNORMAL HIGH (ref 4.0–10.5)
nRBC: 0 % (ref 0.0–0.2)

## 2023-09-04 LAB — BASIC METABOLIC PANEL WITH GFR
Anion gap: 13 (ref 5–15)
BUN: 35 mg/dL — ABNORMAL HIGH (ref 8–23)
CO2: 21 mmol/L — ABNORMAL LOW (ref 22–32)
Calcium: 9.3 mg/dL (ref 8.9–10.3)
Chloride: 103 mmol/L (ref 98–111)
Creatinine, Ser: 1.77 mg/dL — ABNORMAL HIGH (ref 0.61–1.24)
GFR, Estimated: 38 mL/min — ABNORMAL LOW (ref >60)
Glucose, Bld: 123 mg/dL — ABNORMAL HIGH (ref 70–99)
Potassium: 5.1 mmol/L (ref 3.5–5.1)
Sodium: 137 mmol/L (ref 135–145)

## 2023-09-04 LAB — TSH: TSH: 2.024 u[IU]/mL (ref 0.350–4.500)

## 2023-09-04 LAB — TROPONIN I (HIGH SENSITIVITY): Troponin I (High Sensitivity): 7 ng/L (ref <18)

## 2023-09-04 LAB — RESP PANEL BY RT-PCR (RSV, FLU A&B, COVID)  RVPGX2
Influenza A by PCR: NEGATIVE
Influenza B by PCR: NEGATIVE
Resp Syncytial Virus by PCR: NEGATIVE
SARS Coronavirus 2 by RT PCR: NEGATIVE

## 2023-09-04 LAB — PROCALCITONIN: Procalcitonin: <0.1 ng/mL

## 2023-09-04 LAB — URINALYSIS, ROUTINE W REFLEX MICROSCOPIC
Bilirubin Urine: NEGATIVE
Glucose, UA: NEGATIVE mg/dL
Hgb urine dipstick: NEGATIVE
Ketones, ur: NEGATIVE mg/dL
Leukocytes,Ua: NEGATIVE
Nitrite: NEGATIVE
Protein, ur: NEGATIVE mg/dL
Specific Gravity, Urine: 1.015 (ref 1.005–1.030)
pH: 5 (ref 5.0–8.0)

## 2023-09-04 LAB — HEMOGLOBIN A1C
Hgb A1c MFr Bld: 7.5 % — ABNORMAL HIGH (ref 4.8–5.6)
Mean Plasma Glucose: 168.55 mg/dL

## 2023-09-04 LAB — GLUCOSE, CAPILLARY
Glucose-Capillary: 119 mg/dL — ABNORMAL HIGH (ref 70–99)
Glucose-Capillary: 215 mg/dL — ABNORMAL HIGH (ref 70–99)

## 2023-09-04 LAB — CORTISOL: Cortisol, Plasma: 6.2 ug/dL

## 2023-09-04 MED ORDER — DOXYCYCLINE HYCLATE 100 MG PO TABS
100.0000 mg | ORAL_TABLET | Freq: Two times a day (BID) | ORAL | Status: DC
  Administered 2023-09-04 – 2023-09-08 (×9): 100 mg via ORAL
  Filled 2023-09-04 (×9): qty 1

## 2023-09-04 MED ORDER — GLUCAGON HCL RDNA (DIAGNOSTIC) 1 MG IJ SOLR: 1.0000 mg | INTRAMUSCULAR | Status: DC | PRN

## 2023-09-04 MED ORDER — ACETAMINOPHEN 650 MG RE SUPP: 650.0000 mg | Freq: Four times a day (QID) | RECTAL | Status: DC | PRN

## 2023-09-04 MED ORDER — METOPROLOL SUCCINATE ER 50 MG PO TB24
75.0000 mg | ORAL_TABLET | Freq: Every day | ORAL | Status: DC
Start: 2023-09-04 — End: 2023-09-08
  Administered 2023-09-04 – 2023-09-07 (×4): 75 mg via ORAL
  Filled 2023-09-04 (×4): qty 1

## 2023-09-04 MED ORDER — INSULIN ASPART 100 UNIT/ML IJ SOLN
0.0000 [IU] | Freq: Three times a day (TID) | INTRAMUSCULAR | Status: DC
  Administered 2023-09-04 (×2): 2 [IU] via SUBCUTANEOUS
  Administered 2023-09-05: 8 [IU] via SUBCUTANEOUS
  Administered 2023-09-05 (×2): 3 [IU] via SUBCUTANEOUS
  Administered 2023-09-06: 5 [IU] via SUBCUTANEOUS
  Administered 2023-09-06 (×2): 3 [IU] via SUBCUTANEOUS
  Administered 2023-09-07: 5 [IU] via SUBCUTANEOUS
  Administered 2023-09-07: 8 [IU] via SUBCUTANEOUS
  Administered 2023-09-07: 3 [IU] via SUBCUTANEOUS
  Administered 2023-09-08: 5 [IU] via SUBCUTANEOUS
  Administered 2023-09-08: 3 [IU] via SUBCUTANEOUS

## 2023-09-04 MED ORDER — MELATONIN 5 MG PO TABS
5.0000 mg | ORAL_TABLET | Freq: Every day | ORAL | Status: DC
  Administered 2023-09-04 – 2023-09-07 (×4): 5 mg via ORAL
  Filled 2023-09-04 (×4): qty 1

## 2023-09-04 MED ORDER — ALLOPURINOL 100 MG PO TABS
100.0000 mg | ORAL_TABLET | Freq: Every day | ORAL | Status: DC
  Administered 2023-09-04 – 2023-09-08 (×5): 100 mg via ORAL
  Filled 2023-09-04 (×5): qty 1

## 2023-09-04 MED ORDER — INSULIN ASPART 100 UNIT/ML IJ SOLN
0.0000 [IU] | Freq: Every day | INTRAMUSCULAR | Status: DC
  Administered 2023-09-04 – 2023-09-07 (×2): 2 [IU] via SUBCUTANEOUS

## 2023-09-04 MED ORDER — ONDANSETRON HCL 4 MG/2ML IJ SOLN: 4.0000 mg | Freq: Four times a day (QID) | INTRAMUSCULAR | Status: DC | PRN

## 2023-09-04 MED ORDER — SODIUM CHLORIDE 0.9 % IV SOLN: INTRAVENOUS | Status: DC

## 2023-09-04 MED ORDER — HYDRALAZINE HCL 20 MG/ML IJ SOLN: 10.0000 mg | INTRAMUSCULAR | Status: DC | PRN

## 2023-09-04 MED ORDER — IPRATROPIUM-ALBUTEROL 0.5-2.5 (3) MG/3ML IN SOLN: 3.0000 mL | RESPIRATORY_TRACT | Status: DC | PRN

## 2023-09-04 MED ORDER — METOPROLOL TARTRATE 5 MG/5ML IV SOLN: 5.0000 mg | INTRAVENOUS | Status: DC | PRN

## 2023-09-04 MED ORDER — ACETAMINOPHEN 325 MG PO TABS: 650.0000 mg | ORAL_TABLET | Freq: Four times a day (QID) | ORAL | Status: DC | PRN

## 2023-09-04 MED ORDER — GUAIFENESIN 100 MG/5ML PO LIQD: 5.0000 mL | ORAL | Status: DC | PRN

## 2023-09-04 MED ORDER — SENNOSIDES-DOCUSATE SODIUM 8.6-50 MG PO TABS: 1.0000 | ORAL_TABLET | Freq: Every evening | ORAL | Status: DC | PRN

## 2023-09-04 MED ORDER — EZETIMIBE 10 MG PO TABS
10.0000 mg | ORAL_TABLET | Freq: Every day | ORAL | Status: DC
  Administered 2023-09-04 – 2023-09-08 (×5): 10 mg via ORAL
  Filled 2023-09-04 (×5): qty 1

## 2023-09-04 MED ORDER — PERFLUTREN LIPID MICROSPHERE
1.0000 mL | INTRAVENOUS | Status: AC | PRN
  Administered 2023-09-04: 2 mL via INTRAVENOUS

## 2023-09-04 MED ORDER — FLUOXETINE HCL 10 MG PO CAPS
10.0000 mg | ORAL_CAPSULE | Freq: Every evening | ORAL | Status: DC
  Administered 2023-09-04 – 2023-09-07 (×4): 10 mg via ORAL
  Filled 2023-09-04 (×5): qty 1

## 2023-09-04 MED ORDER — APIXABAN 5 MG PO TABS
5.0000 mg | ORAL_TABLET | Freq: Two times a day (BID) | ORAL | Status: DC
  Administered 2023-09-04 – 2023-09-08 (×9): 5 mg via ORAL
  Filled 2023-09-04 (×9): qty 1

## 2023-09-04 MED ORDER — PANTOPRAZOLE SODIUM 40 MG PO TBEC
40.0000 mg | DELAYED_RELEASE_TABLET | Freq: Every day | ORAL | Status: DC
  Administered 2023-09-04 – 2023-09-08 (×5): 40 mg via ORAL
  Filled 2023-09-04 (×5): qty 1

## 2023-09-04 MED ORDER — PREGABALIN 100 MG PO CAPS
100.0000 mg | ORAL_CAPSULE | Freq: Two times a day (BID) | ORAL | Status: DC
  Administered 2023-09-04 – 2023-09-08 (×9): 100 mg via ORAL
  Filled 2023-09-04 (×9): qty 1

## 2023-09-04 MED ORDER — METOPROLOL TARTRATE 50 MG PO TABS: 75.0000 mg | ORAL_TABLET | Freq: Every day | ORAL | Status: DC

## 2023-09-04 NOTE — Progress Notes (Addendum)
 Pt set-up on CPAP, could only tolerate 4 cmH2O. Pt state he will place himself on unit, when he is ready. He was advised to call RT if assistance is needed.

## 2023-09-04 NOTE — Hospital Course (Addendum)
 Brief Narrative:  83 year old with history of A-fib on Eliquis, HTN, chronic CHF with preserved EF, OSA on CPAP, DM2, obesity, CKD 3A, hepatocellular carcinoma, GERD, prostate cancer comes to the ED after a fall and struck the back of the head.  Upon admission trauma workup including CT head and cervical spine were negative.  Was found to have AKI and hyperkalemia.  Patient was given Lokelma, IV calcium gluconate, insulin and D50, nebulizer.   Assessment & Plan:  Principal Problem:   Syncope Active Problems:   Type 2 diabetes mellitus (HCC)   OSA (obstructive sleep apnea)   Atrial fibrillation (HCC)   Cellulitis   AKI (acute kidney injury) (HCC)   Hyperkalemia    Syncope Unclear etiology.  Chest x-ray is negative.  EKG showing rate controlled atrial fibrillation.  Had another fall about a week ago, we can possibly consider ambulatory cardiac monitor - Echocardiogram, carotid Dopplers - PT/OT - Check TSH, orthostatics, cortisol  A-fib, chronic Currently rate controlled.  Metoprolol and Cardizem on hold.  Wonder if his medications are causing intermittent bradycardia worsening his orthostasis.  Will consult cardiology as patient used to follow their service and will need outpatient follow-up.    Right knee skin abrasion with possible cellulitis Secondary to mechanical fall a week ago.  Does have leukocytosis on labs.  Start doxycycline and monitor closely.  Monitor WBC count.  Wound care.   AKI on CKD stage IIIa Prerenal in nature.  Baseline creatinine 1.2, admission 2.12, improving with IV fluids.   Hyperkalemia Received treatment in the ER, improved      Thrombocytosis Repeat CBC ordered to confirm.   Insulin-dependent type 2 diabetes A1c 7.5.  Sliding scale and Accu-Chek.   Chronic HFpEF Appears to be euvolemic.  Prior history of preserved EF.  Update echocardiogram has been ordered by admitting provider   Hypertension Hold antihypertensives at this time and check  orthostatics.   OSA Continue nightly CPAP.  History of hepatocellular carcinoma - Underwent micro ablation and transcatheter embolization about 5 years ago.  Currently under yearly surveillance     DVT prophylaxis:       Code Status: Limited: Do not attempt resuscitation (DNR) -DNR-LIMITED -Do Not Intubate/DNI  Family Communication:   Continue hospital stay due to feeling lightheadedness, dizzy    Subjective: Seen at bedside.  Tells me he feels little better today but does not remember what happened yesterday before passing out.  Denies any chest pain, palpitations, lightheadedness, dizziness. Also reports had a fall a week ago but that was more mechanical fall while playing with her dog.   Examination:  General exam: Appears calm and comfortable  Respiratory system: Clear to auscultation. Respiratory effort normal. Cardiovascular system: S1 & S2 heard, RRR. No JVD, murmurs, rubs, gallops or clicks. No pedal edema. Gastrointestinal system: Abdomen is nondistended, soft and nontender. No organomegaly or masses felt. Normal bowel sounds heard. Central nervous system: Alert and oriented. No focal neurological deficits. Extremities: Symmetric 5 x 5 power. Skin: No rashes, lesions or ulcers Psychiatry: Judgement and insight appear normal. Mood & affect appropriate.

## 2023-09-04 NOTE — Consult Note (Addendum)
 Cardiology Consultation   Patient ID: Steve Jensen MRN: [id number]; DOB: August 18, 1940  Admit date: 09/03/2023 Date of Consult: 09/04/2023  PCP:  Steve Raider, MD   [facility] Providers Cardiologist: Dr. Lance Jensen  CC: tired, fatigue, falls  RE: Syncope  Patient Profile:   Steve Jensen is a 83 y.o. male with a hx of longstanding persistent atrial fibrillation, chronic diastolic CHF, CKD IIIa, hepatocellular carcinoma, GERD, prostate cancer, DM type II, OSA who is being seen 09/04/2023 for the evaluation of syncope at the request of Dr. Nelson Jensen.  History of Present Illness:   [name] presented to the ED on 04/10 with EMS after a fall at home of unknown cause. Patient did hit his head and it is unknown whether there was a loss of conscious either prior to his fall or as a result of hitting his head. Per note, patient talkative/without deficits during EMS transport.   In the ED labs notable for hyperkalemia (6.3), AKI on CKD with creatinine 2.12, BUN 39. EKG showed rate controlled atrial fibrillation. X-ray and CT imaging negative for acute traumatic findings. Given lab abnormalities, patient admitted for further evaluation/management. Cardiology consulted today by admitting team due to concern that patient's fall may have been secondary to medication induced bradycardia (takes Metoprolol and Diltiazem) - .   Patient previously followed with Dr. Eldridge Jensen, was most recently seen by Steve Favre PA-C in February 2025. At that visit, patient reported intermittent sensation of "skipped beats." Notes also indicate a degree of increased LE edema and patient was prescribed increased dose of Lasix, 80mg  x3 days (normally takes 40mg  daily).   On exam in the ED today, patient denies any prodromal symptoms regarding recent fall, says he has no idea what happened. Denies any recent dizziness/lightheadedness, palpitations, chest pain, dyspnea. He does report a chronic productive  cough over the last few months. Per patient, his fall last week was because he was knocked off balance by his dogs. He also denies worsening LE edema. Has been sleeping in his recliner recently but says this was due to a cough that was keeping him from sleeping.    At patient's request, I called his daughter-in-law Steve Jensen who reports a different account of events. Per her, patient has been lethargic, weak, with cough for 2 weeks. He has spent much more time than usual sitting at home. Patient has reported muscle aches to her but no measured fever. No vomiting, diarrhea. Normal appetite reported.  Per Steve Jensen, patient was outside on Saturday, sitting in his walker but slid out of walker onto ground. Family had to help him up but struggled due to his size and this resulted in a scrape to his knee. They were eventually able to get him into the house. Patient then apparently fell to the floor in the bathroom on Saturday from weakness, required family assistance to get back to bed.   Patient continued to be week/fatigued over the weekend and EMS was called on Monday. Paramedic said she heard fluid in lungs but patient declined transportation.   Per Steve Jensen, yesterday's fall was witnessed by an daughter-in-law and son. Patient apparently stood up yesterday after a prolonged period of sitting yesterday and fell hard hitting his head.    Past Medical History:  Diagnosis Date   Anemia    Diabetes mellitus type II, controlled (HCC)    with neuropathy   Dysrhythmia    A-Fib. cardioversion done   GERD (gastroesophageal reflux disease)    Hyperlipidemia  Hypertension    Morbid obesity (HCC)    Neuromuscular disorder (HCC)    feet   OSA (obstructive sleep apnea)    On BiPAP at 15/11cm H2O   Prostate cancer (HCC)    Shingles    on face Nov 2010   Urinary incontinence     Past Surgical History:  Procedure Laterality Date   CARDIOVERSION N/A 09/06/2013   Procedure: CARDIOVERSION;  Surgeon:  Corky Crafts, MD;  Location: [facility] ENDOSCOPY;  Service: Cardiovascular;  Laterality: N/A;   EYE SURGERY     cataract surgery bilat; surgery to correct droopy eyelids    FLEXIBLE SIGMOIDOSCOPY N/A 12/24/2021   Procedure: FLEXIBLE SIGMOIDOSCOPY;  Surgeon: Dolores Frame, MD;  Location: [facility] ENDO SUITE;  Service: Gastroenterology;  Laterality: N/A;   IR ANGIOGRAM SELECTIVE EACH ADDITIONAL VESSEL  02/15/2018   IR ANGIOGRAM SELECTIVE EACH ADDITIONAL VESSEL  02/15/2018   IR ANGIOGRAM SELECTIVE EACH ADDITIONAL VESSEL  02/15/2018   IR ANGIOGRAM SELECTIVE EACH ADDITIONAL VESSEL  02/15/2018   IR ANGIOGRAM SELECTIVE EACH ADDITIONAL VESSEL  02/15/2018   IR ANGIOGRAM VISCERAL SELECTIVE  02/15/2018   IR ANGIOGRAM VISCERAL SELECTIVE  02/15/2018   IR EMBO TUMOR ORGAN ISCHEMIA INFARCT INC GUIDE ROADMAPPING  02/15/2018   IR RADIOLOGIST EVAL & MGMT  01/26/2018   IR RADIOLOGIST EVAL & MGMT  03/17/2018   IR RADIOLOGIST EVAL & MGMT  06/29/2018   IR RADIOLOGIST EVAL & MGMT  07/27/2018   IR RADIOLOGIST EVAL & MGMT  10/28/2018   IR RADIOLOGIST EVAL & MGMT  12/14/2018   IR RADIOLOGIST EVAL & MGMT  03/17/2019   IR RADIOLOGIST EVAL & MGMT  07/14/2019   IR RADIOLOGIST EVAL & MGMT  12/27/2019   IR RADIOLOGIST EVAL & MGMT  08/21/2020   IR RADIOLOGIST EVAL & MGMT  02/28/2021   IR RADIOLOGIST EVAL & MGMT  06/25/2021   IR RADIOLOGIST EVAL & MGMT  10/18/2021   IR US GUIDE VASC ACCESS RIGHT  02/15/2018   knee surgery bilat      PROSTATE SURGERY     RADIOLOGY WITH ANESTHESIA N/A 11/24/2018   Procedure: MICROWAVE THERMAL ABLATION LIVER;  Surgeon: Simonne Come, MD;  Location: [facility] ORS;  Service: Anesthesiology;  Laterality: N/A;   right shoulder rotator cuff surgery     TONSILLECTOMY       Home Medications:  Prior to Admission medications   Medication Sig Start Date End Date Taking? Authorizing Provider  allopurinol (ZYLOPRIM) 100 MG tablet Take 100 mg by mouth daily. 04/17/21  Yes [provider]  apixaban (ELIQUIS) 5 MG  TABS tablet TAKE 1 TABLET(5 MG) BY MOUTH TWICE DAILY Patient taking differently: Take 5 mg by mouth 2 (two) times daily. 11/15/18  Yes Turner, Cornelious Bryant, MD  benzonatate (TESSALON) 100 MG capsule Take 100 mg by mouth 3 (three) times daily as needed. 06/08/23  Yes [provider]  diltiazem (CARDIZEM CD) 300 MG 24 hr capsule TAKE 1 CAPSULE BY MOUTH EVERY DAY 02/19/23  Yes Corky Crafts, MD  ezetimibe (ZETIA) 10 MG tablet Take 10 mg by mouth daily.   Yes [provider]  FLUoxetine (PROZAC) 10 MG capsule Take 10 mg by mouth every evening. 04/14/23  Yes [provider]  furosemide (LASIX) 40 MG tablet Take 40 mg by mouth daily.   Yes [provider]  guaiFENesin (MUCINEX) 600 MG 12 hr tablet Take 600 mg by mouth 2 (two) times daily as needed for cough or to loosen phlegm.   Yes  [provider]  Insulin Glargine-Lixisenatide (SOLIQUA) 100-33 UNT-MCG/ML SOPN Inject 22 Units into the skin daily.   Yes [provider]  melatonin 5 MG TABS Take 5 mg by mouth at bedtime. 11/17/22  Yes [provider]  metFORMIN (GLUCOPHAGE) 1000 MG tablet Take 1,000 mg by mouth 2 (two) times daily with a meal. 06/27/14  Yes [provider]  metoprolol succinate (TOPROL-XL) 50 MG 24 hr tablet Take 1.5 tablets (75 mg total) by mouth daily. 11/15/22  Yes Johnson, Clanford L, MD  Multiple Vitamins-Minerals (MULTIVITAMIN MEN 50+) TABS Take 1 tablet by mouth every evening.   Yes [provider]  Omega-3 Fatty Acids (FISH OIL PO) Take 1 capsule by mouth 2 (two) times daily.   Yes [provider]  omeprazole (PRILOSEC) 20 MG capsule Take 1 capsule (20 mg total) by mouth daily. 11/15/22  Yes Johnson, Clanford L, MD  potassium chloride SA (KLOR-CON M) 20 MEQ tablet Take 1 tablet (20 mEq total) by mouth daily. Patient taking differently: Take 40 mEq by mouth every evening. 11/15/22  Yes Johnson, Clanford L, MD  pregabalin (LYRICA) 100 MG capsule Take  100 mg by mouth 2 (two) times a day.  09/14/18  Yes [provider]  spironolactone (ALDACTONE) 100 MG tablet Take 50 mg by mouth daily.   Yes [provider]    Inpatient Medications: Scheduled Meds:  allopurinol  100 mg Oral Daily   apixaban  5 mg Oral BID   doxycycline  100 mg Oral Q12H   ezetimibe  10 mg Oral Daily   FLUoxetine  10 mg Oral QPM   insulin aspart  0-15 Units Subcutaneous TID WC   insulin aspart  0-5 Units Subcutaneous QHS   melatonin  5 mg Oral QHS   metoprolol succinate  75 mg Oral Daily   pantoprazole  40 mg Oral Daily   pregabalin  100 mg Oral BID   Continuous Infusions:  sodium chloride 50 mL/hr at 09/04/23 0914   PRN Meds: acetaminophen **OR** acetaminophen, glucagon (human recombinant), guaiFENesin, hydrALAZINE, ipratropium-albuterol, metoprolol tartrate, ondansetron (ZOFRAN) IV, senna-docusate  Allergies:    Allergies  Allergen Reactions   Lipitor [Atorvastatin] Other (See Comments)    Myalgias   Zithromax [Azithromycin] Other (See Comments)    GI intolerance   Zocor [Simvastatin] Other (See Comments)    Myalgias    Norvasc [Amlodipine] Swelling    Social History:   Social History   Socioeconomic History   Marital status: Widowed    Spouse name: Not on file   Number of children: Not on file   Years of education: Not on file   Highest education level: Not on file  Occupational History   Not on file  Tobacco Use   Smoking status: Former    Current packs/day: 0.00    Average packs/day: 1 pack/day for 15.0 years (15.0 ttl pk-yrs)    Types: Cigarettes    Start date: 05/26/1960    Quit date: 05/27/1975    Years since quitting: 48.3   Smokeless tobacco: Never  Vaping Use   Vaping status: Never Used  Substance and Sexual Activity   Alcohol use: No   Drug use: No   Sexual activity: Not on file  Other Topics Concern   Not on file  Social History Narrative   Not on file   Social Drivers of Health   Financial Resource  Strain: Not on file  Food Insecurity: No Food Insecurity (09/04/2023)   Hunger Vital Sign  Worried About Programme researcher, broadcasting/film/video in the Last Year: Never true    Ran Out of Food in the Last Year: Never true  Transportation Needs: No Transportation Needs (09/04/2023)   PRAPARE - Administrator, Civil Service (Medical): No    Lack of Transportation (Non-Medical): No  Physical Activity: Not on file  Stress: Not on file  Social Connections: Socially Isolated (09/04/2023)   Social Connection and Isolation Panel [NHANES]    Frequency of Communication with Friends and Family: More than three times a week    Frequency of Social Gatherings with Friends and Family: More than three times a week    Attends Religious Services: Never    Database administrator or Organizations: No    Attends Banker Meetings: Never    Marital Status: Widowed  Intimate Partner Violence: Not At Risk (09/04/2023)   Humiliation, Afraid, Rape, and Kick questionnaire    Fear of Current or Ex-Partner: No    Emotionally Abused: No    Physically Abused: No    Sexually Abused: No    Family History:    Family History  Problem Relation Age of Onset   Hypertension Brother    Diabetes Brother    Cancer Brother        bladder cancer   Diabetes Brother    CVA Brother    Hypertension Brother    Prostate cancer Brother    Diabetes Brother    Hypertension Brother    Heart disease Brother        CABG   Heart attack Brother    Stroke Brother      ROS:  Review of Systems  Constitutional: Positive for malaise/fatigue.  Cardiovascular:  Negative for chest pain, claudication, irregular heartbeat, leg swelling, near-syncope, orthopnea, palpitations, paroxysmal nocturnal dyspnea and syncope.  Respiratory:  Positive for cough. Negative for shortness of breath.   Hematologic/Lymphatic: Negative for bleeding problem.  Musculoskeletal:  Positive for falls.     Physical Exam/Data:   Vitals:   09/04/23  0936 09/04/23 1103 09/04/23 1200 09/04/23 1431  BP: 107/66 120/89  122/74  Pulse: 86 73  79  Resp:  16  16  Temp:   97.7 F (36.5 C) 97.6 F (36.4 C)  TempSrc:   Oral Oral  SpO2: 94% 100%    Weight:      Height:       No intake or output data in the 24 hours ending 09/04/23 1649    09/03/2023    8:46 PM 06/29/2023    2:50 PM 12/24/2022    8:38 AM  Last 3 Weights  Weight (lbs) 281 lb 281 lb 9.6 oz 288 lb 9.6 oz  Weight (kg) 127.461 kg 127.733 kg 130.908 kg     Body mass index is 40.32 kg/m.  General:  Well nourished, well developed. Very weak. HEENT: normal Neck: no JVD Vascular: No carotid bruits; Distal pulses 2+ bilaterally Cardiac:  normal S1, S2; irregularly irregular; no murmur  Lungs: poor inspiratory effort but no obvious crackles, wheezing Abd: soft, nontender, no hepatomegaly  Ext: trace-1+ bilateral LE edema Musculoskeletal:  No deformities, BUE and BLE strength normal and equal Skin: warm and dry  Neuro:  CNs 2-12 intact, no focal abnormalities noted Psych:  Normal affect   EKG:  The EKG was personally reviewed and demonstrates:  atrial fibrillation with ventricular rate ~63bpm. No significant changes when compared to prior tracings.  Telemetry:  Telemetry was personally reviewed and demonstrates:  persistent atrial flutter with 2 isolated pauses, longest of which was 2.3 seconds.   Relevant CV Studies:  09/04/23 TTE  IMPRESSIONS     1. Left ventricular ejection fraction, by estimation, is 55 to 60%. The  left ventricle has normal function. The left ventricle has no regional  wall motion abnormalities. Left ventricular diastolic function could not  be evaluated.   2. Right ventricular systolic function is mildly reduced. The right  ventricular size is mildly enlarged. Tricuspid regurgitation signal is  inadequate for assessing PA pressure.   3. Left atrial size was mildly dilated.   4. The mitral valve is normal in structure. Trivial mitral valve   regurgitation. No evidence of mitral stenosis.   5. The aortic valve is calcified. There is moderate calcification of the  aortic valve. There is mild thickening of the aortic valve. Aortic valve  regurgitation is mild. Aortic valve sclerosis/calcification is present,  without any evidence of aortic  stenosis.   6. The inferior vena cava is normal in size with greater than 50%  respiratory variability, suggesting right atrial pressure of 3 mmHg.   FINDINGS   Left Ventricle: Left ventricular ejection fraction, by estimation, is 55  to 60%. The left ventricle has normal function. The left ventricle has no  regional wall motion abnormalities. Definity contrast agent was given IV  to delineate the left ventricular   endocardial borders. The left ventricular internal cavity size was normal  in size. There is no left ventricular hypertrophy. Left ventricular  diastolic function could not be evaluated due to atrial fibrillation. Left  ventricular diastolic function could   not be evaluated.   Right Ventricle: Poorly visualized but does appear to have at least mildly  reduced function. The right ventricular size is mildly enlarged. No  increase in right ventricular wall thickness. Right ventricular systolic  function is mildly reduced. Tricuspid  regurgitation signal is inadequate for assessing PA pressure.   Left Atrium: Left atrial size was mildly dilated.   Right Atrium: Right atrial size was normal in size.   Pericardium: There is no evidence of pericardial effusion.   Mitral Valve: The mitral valve is normal in structure. Trivial mitral  valve regurgitation. No evidence of mitral valve stenosis.   Tricuspid Valve: The tricuspid valve is normal in structure. Tricuspid  valve regurgitation is mild . No evidence of tricuspid stenosis.   Aortic Valve: The aortic valve is calcified. There is moderate  calcification of the aortic valve. There is mild thickening of the aortic  valve.  Aortic valve regurgitation is mild. Aortic valve  sclerosis/calcification is present, without any evidence of  aortic stenosis. Aortic valve mean gradient measures 3.0 mmHg. Aortic  valve peak gradient measures 5.1 mmHg. Aortic valve area, by VTI measures  3.75 cm.   Pulmonic Valve: The pulmonic valve was normal in structure. Pulmonic valve  regurgitation is not visualized. No evidence of pulmonic stenosis.   Aorta: The aortic root and ascending aorta are structurally normal, with  no evidence of dilitation.   Venous: The inferior vena cava is normal in size with greater than 50%  respiratory variability, suggesting right atrial pressure of 3 mmHg.   IAS/Shunts: No atrial level shunt detected by color flow Doppler.   Laboratory Data:  High Sensitivity Troponin:   Recent Labs  Lab 09/04/23 0516  TROPONINIHS 7     Chemistry Recent Labs  Lab 09/03/23 2103 09/04/23 0516  NA 135 137  K 6.3* 5.1  CL 99 103  CO2 23 21*  GLUCOSE 184* 123*  BUN 39* 35*  CREATININE 2.12* 1.77*  CALCIUM 9.5 9.3  GFRNONAA 31* 38*  ANIONGAP 13 13    Recent Labs  Lab 09/03/23 2103  PROT 7.0  ALBUMIN 3.1*  AST 31  ALT 20  ALKPHOS 91  BILITOT 0.4   Lipids No results for input(s): "CHOL", "TRIG", "HDL", "LABVLDL", "LDLCALC", "CHOLHDL" in the last 168 hours.  Hematology Recent Labs  Lab 09/03/23 2103 09/04/23 0516  WBC 19.5* 18.6*  RBC 5.40 4.99  HGB 14.3 13.2  HCT 44.5 43.5  MCV 82.4 87.2  MCH 26.5 26.5  MCHC 32.1 30.3  RDW 17.7* 17.6*  PLT 509* 442*   Thyroid No results for input(s): "TSH", "FREET4" in the last 168 hours.  BNP Recent Labs  Lab 09/03/23 2103  BNP 152.3*    DDimer No results for input(s): "DDIMER" in the last 168 hours.   Radiology/Studies:  ECHOCARDIOGRAM COMPLETE Result Date: 09/04/2023    ECHOCARDIOGRAM REPORT   Patient Name:   MAXIMUS HOFFERT Date of Exam: 09/04/2023 Medical Rec #:  [id number]      Height:       70.0 in Accession #:    [id number]      Weight:       281.0 lb Date of Birth:  1941/04/30     BSA:          2.412 m Patient Age:    82 years       BP:           107/66 mmHg Patient Gender: M              HR:           95 bpm. Exam Location:  Inpatient Procedure: 2D Echo, Cardiac Doppler, Color Doppler and Intracardiac            Opacification Agent (Both Spectral and Color Flow Doppler were            utilized during procedure). Indications:    Syncope  History:        Patient has prior history of Echocardiogram examinations, most                 recent 11/12/2022. Arrythmias:Atrial Fibrillation; Risk                 Factors:Sleep Apnea, Dyslipidemia, Diabetes and Hypertension.  Sonographer:    Melton Krebs RDCS, FE, PE Referring Phys: [id number] VASUNDHRA RATHORE IMPRESSIONS  1. Left ventricular ejection fraction, by estimation, is 55 to 60%. The left ventricle has normal function. The left ventricle has no regional wall motion abnormalities. Left ventricular diastolic function could not be evaluated.  2. Right ventricular systolic function is mildly reduced. The right ventricular size is mildly enlarged. Tricuspid regurgitation signal is inadequate for assessing PA pressure.  3. Left atrial size was mildly dilated.  4. The mitral valve is normal in structure. Trivial mitral valve regurgitation. No evidence of mitral stenosis.  5. The aortic valve is calcified. There is moderate calcification of the aortic valve. There is mild thickening of the aortic valve. Aortic valve regurgitation is mild. Aortic valve sclerosis/calcification is present, without any evidence of aortic stenosis.  6. The inferior vena cava is normal in size with greater than 50% respiratory variability, suggesting right atrial pressure of 3 mmHg. FINDINGS  Left Ventricle: Left ventricular ejection fraction, by estimation, is 55 to 60%. The left ventricle has normal function. The left ventricle has no  regional wall motion abnormalities. Definity contrast agent was given IV to delineate  the left ventricular  endocardial borders. The left ventricular internal cavity size was normal in size. There is no left ventricular hypertrophy. Left ventricular diastolic function could not be evaluated due to atrial fibrillation. Left ventricular diastolic function could  not be evaluated. Right Ventricle: Poorly visualized but does appear to have at least mildly reduced function. The right ventricular size is mildly enlarged. No increase in right ventricular wall thickness. Right ventricular systolic function is mildly reduced. Tricuspid regurgitation signal is inadequate for assessing PA pressure. Left Atrium: Left atrial size was mildly dilated. Right Atrium: Right atrial size was normal in size. Pericardium: There is no evidence of pericardial effusion. Mitral Valve: The mitral valve is normal in structure. Trivial mitral valve regurgitation. No evidence of mitral valve stenosis. Tricuspid Valve: The tricuspid valve is normal in structure. Tricuspid valve regurgitation is mild . No evidence of tricuspid stenosis. Aortic Valve: The aortic valve is calcified. There is moderate calcification of the aortic valve. There is mild thickening of the aortic valve. Aortic valve regurgitation is mild. Aortic valve sclerosis/calcification is present, without any evidence of aortic stenosis. Aortic valve mean gradient measures 3.0 mmHg. Aortic valve peak gradient measures 5.1 mmHg. Aortic valve area, by VTI measures 3.75 cm. Pulmonic Valve: The pulmonic valve was normal in structure. Pulmonic valve regurgitation is not visualized. No evidence of pulmonic stenosis. Aorta: The aortic root and ascending aorta are structurally normal, with no evidence of dilitation. Venous: The inferior vena cava is normal in size with greater than 50% respiratory variability, suggesting right atrial pressure of 3 mmHg. IAS/Shunts: No atrial level shunt detected by color flow Doppler.  LEFT VENTRICLE PLAX 2D LVIDd:         4.45 cm    Diastology LVIDs:         2.90 cm   LV e' medial:    11.20 cm/s LV PW:         1.00 cm   LV E/e' medial:  10.3 LV IVS:        1.00 cm   LV e' lateral:   12.00 cm/s LVOT diam:     2.50 cm   LV E/e' lateral: 9.6 LV SV:         83 LV SV Index:   34 LVOT Area:     4.91 cm  LEFT ATRIUM             Index LA Vol (A2C):   68.9 ml 28.57 ml/m LA Vol (A4C):   87.4 ml 36.24 ml/m LA Biplane Vol: 79.9 ml 33.13 ml/m  AORTIC VALVE AV Area (Vmax):    4.03 cm AV Area (Vmean):   3.72 cm AV Area (VTI):     3.75 cm AV Vmax:           113.00 cm/s AV Vmean:          79.000 cm/s AV VTI:            0.221 m AV Peak Grad:      5.1 mmHg AV Mean Grad:      3.0 mmHg LVOT Vmax:         92.70 cm/s LVOT Vmean:        59.800 cm/s LVOT VTI:          0.169 m LVOT/AV VTI ratio: 0.76  AORTA Ao Root diam: 3.60 cm Ao Asc diam:  3.60 cm MITRAL VALVE  TRICUSPID VALVE MV Area (PHT): 4.68 cm     TR Peak grad:   12.4 mmHg MV Decel Time: 162 msec     TR Vmax:        176.00 cm/s MV E velocity: 115.00 cm/s MV A velocity: 55.90 cm/s   SHUNTS MV E/A ratio:  2.06         Systemic VTI:  0.17 m                             Systemic Diam: 2.50 cm Clearnce Hasten Electronically signed by Clearnce Hasten Signature Date/Time: 09/04/2023/10:45:13 AM    Final    VAS US CAROTID Result Date: 09/04/2023 Carotid Arterial Duplex Study Patient Name:  JAXSON ANGLIN [facility]  Date of Exam:   09/04/2023 Medical Rec #: [id number]       Accession #:    [id number] Date of Birth: 07/10/1940      Patient Gender: M Patient Age:   27 years Exam Location:  [facility] Procedure:      VAS US CAROTID Referring Phys: Ulyess Blossom RATHORE --------------------------------------------------------------------------------  Indications:       Syncope. Risk Factors:      Hypertension, hyperlipidemia, Diabetes. Other Factors:     A-fib - on Eliquis. Comparison Study:  No priors. Performing Technologist: Strawberry Sink Sturdivant-Jones RDMS, RVT  Examination Guidelines: A complete evaluation  includes B-mode imaging, spectral Doppler, color Doppler, and power Doppler as needed of all accessible portions of each vessel. Bilateral testing is considered an integral part of a complete examination. Limited examinations for reoccurring indications may be performed as noted.  Right Carotid Findings: +----------+--------+--------+--------+------------------+--------+           PSV cm/sEDV cm/sStenosisPlaque DescriptionComments +----------+--------+--------+--------+------------------+--------+ CCA Prox  85      14                                         +----------+--------+--------+--------+------------------+--------+ CCA Distal75      14                                         +----------+--------+--------+--------+------------------+--------+ ICA Prox  73      19      1-39%   calcific                   +----------+--------+--------+--------+------------------+--------+ ICA Distal85      22                                         +----------+--------+--------+--------+------------------+--------+ ECA       112     15                                         +----------+--------+--------+--------+------------------+--------+ +----------+--------+-------+----------------+-------------------+           PSV cm/sEDV cmsDescribe        Arm Pressure (mmHG) +----------+--------+-------+----------------+-------------------+ Subclavian100            Multiphasic, WNL                    +----------+--------+-------+----------------+-------------------+ +---------+--------+--+--------+--+---------+  VertebralPSV cm/s52EDV cm/s16Antegrade +---------+--------+--+--------+--+---------+  Left Carotid Findings: +----------+--------+--------+--------+------------------+------------------+           PSV cm/sEDV cm/sStenosisPlaque DescriptionComments           +----------+--------+--------+--------+------------------+------------------+ CCA Prox  67      17                                                    +----------+--------+--------+--------+------------------+------------------+ CCA Distal126     22                                                   +----------+--------+--------+--------+------------------+------------------+ ICA Prox  66      17      1-39%                     intimal thickening +----------+--------+--------+--------+------------------+------------------+ ICA Distal79      28                                                   +----------+--------+--------+--------+------------------+------------------+ ECA       176     16                                                   +----------+--------+--------+--------+------------------+------------------+ +----------+--------+--------+----------------+-------------------+           PSV cm/sEDV cm/sDescribe        Arm Pressure (mmHG) +----------+--------+--------+----------------+-------------------+ [id number]              Multiphasic, WNL                    +----------+--------+--------+----------------+-------------------+ +---------+--------+--+--------+--+---------+ VertebralPSV cm/s38EDV cm/s12Antegrade +---------+--------+--+--------+--+---------+   Summary: Right Carotid: Velocities in the right ICA are consistent with a 1-39% stenosis. Left Carotid: Velocities in the left ICA are consistent with a 1-39% stenosis. Vertebrals:  Bilateral vertebral arteries demonstrate antegrade flow. Subclavians: Normal flow hemodynamics were seen in bilateral subclavian              arteries. *See table(s) above for measurements and observations.     Preliminary    DG Chest Port 1 View Result Date: 09/03/2023 CLINICAL DATA:  Trauma EXAM: PORTABLE CHEST 1 VIEW COMPARISON:  Chest x-ray 11/13/2022 FINDINGS: The heart and mediastinal contours are unchanged. Atherosclerotic plaque. No focal consolidation. No pulmonary edema. No pleural effusion. No pneumothorax. No acute  osseous abnormality. IMPRESSION: 1. No active disease. 2.  Aortic Atherosclerosis (ICD10-I70.0). Electronically Signed   By: Tish Frederickson M.D.   On: 09/03/2023 22:43   DG Pelvis Portable Result Date: 09/03/2023 CLINICAL DATA:  Trauma EXAM: PORTABLE PELVIS 1-2 VIEWS COMPARISON:  X-ray pelvis 12/23/2021 FINDINGS: Right femoral trochanter collimated off view. No acute displaced fracture or dislocation of the hips. There is no evidence of pelvic fracture or diastasis. No pelvic bone lesions are seen. Surgical clips overlie the pelvis. IMPRESSION: 1.  Negative for acute traumatic injury. 2. Right femoral trochanter collimated  off view. Electronically Signed   By: Tish Frederickson M.D.   On: 09/03/2023 22:42   CT HEAD WO CONTRAST Result Date: 09/03/2023 CLINICAL DATA:  Pt fell today and hit the back of his head on the floor. Alert and oriented, but does not remember the fall. No obvious injury. Patient denies pain. EXAM: CT HEAD WITHOUT CONTRAST CT CERVICAL SPINE WITHOUT CONTRAST TECHNIQUE: Multidetector CT imaging of the head and cervical spine was performed following the standard protocol without intravenous contrast. Multiplanar CT image reconstructions of the cervical spine were also generated. RADIATION DOSE REDUCTION: This exam was performed according to the departmental dose-optimization program which includes automated exposure control, adjustment of the mA and/or kV according to patient size and/or use of iterative reconstruction technique. COMPARISON:  CT head 11/11/2022 FINDINGS: CT HEAD FINDINGS Brain: Cerebral ventricle sizes are concordant with the degree of cerebral volume loss. Patchy and confluent areas of decreased attenuation are noted throughout the deep and periventricular white matter of the cerebral hemispheres bilaterally, compatible with chronic microvascular ischemic disease. No evidence of large-territorial acute infarction. No parenchymal hemorrhage. No mass lesion. No extra-axial  collection. No mass effect or midline shift. No hydrocephalus. Basilar cisterns are patent. Vascular: No hyperdense vessel. Atherosclerotic calcifications are present within the cavernous internal carotid arteries. Skull: No acute fracture or focal lesion. Sinuses/Orbits: Paranasal sinuses and mastoid air cells are clear. Bilateral lens replacement. Otherwise the orbits are unremarkable. Other: Left nasolacrimal duct enlargement (3:10). CT CERVICAL SPINE FINDINGS Alignment: Normal. Skull base and vertebrae: Multilevel moderate to severe degenerative changes spine with associated at least moderate left C4-C5 osseous neural foraminal stenosis. No severe osseous central canal stenosis. + No acute fracture. No aggressive appearing focal osseous lesion or focal pathologic process. Soft tissues and spinal canal: No prevertebral fluid or swelling. No visible canal hematoma. Upper chest: Unremarkable. Other: Atherosclerotic plaque of the aortic arch and its main branches. IMPRESSION: 1. No acute intracranial abnormality. 2. No acute displaced fracture or traumatic listhesis of the cervical spine. 3. Indeterminate left nasolacrimal duct enlargement. Correlate with clinical exam. 4.  Aortic Atherosclerosis (ICD10-I70.0). Electronically Signed   By: Tish Frederickson M.D.   On: 09/03/2023 22:40   CT CERVICAL SPINE WO CONTRAST Result Date: 09/03/2023 CLINICAL DATA:  Pt fell today and hit the back of his head on the floor. Alert and oriented, but does not remember the fall. No obvious injury. Patient denies pain. EXAM: CT HEAD WITHOUT CONTRAST CT CERVICAL SPINE WITHOUT CONTRAST TECHNIQUE: Multidetector CT imaging of the head and cervical spine was performed following the standard protocol without intravenous contrast. Multiplanar CT image reconstructions of the cervical spine were also generated. RADIATION DOSE REDUCTION: This exam was performed according to the departmental dose-optimization program which includes automated  exposure control, adjustment of the mA and/or kV according to patient size and/or use of iterative reconstruction technique. COMPARISON:  CT head 11/11/2022 FINDINGS: CT HEAD FINDINGS Brain: Cerebral ventricle sizes are concordant with the degree of cerebral volume loss. Patchy and confluent areas of decreased attenuation are noted throughout the deep and periventricular white matter of the cerebral hemispheres bilaterally, compatible with chronic microvascular ischemic disease. No evidence of large-territorial acute infarction. No parenchymal hemorrhage. No mass lesion. No extra-axial collection. No mass effect or midline shift. No hydrocephalus. Basilar cisterns are patent. Vascular: No hyperdense vessel. Atherosclerotic calcifications are present within the cavernous internal carotid arteries. Skull: No acute fracture or focal lesion. Sinuses/Orbits: Paranasal sinuses and mastoid air cells are clear. Bilateral lens replacement. Otherwise  the orbits are unremarkable. Other: Left nasolacrimal duct enlargement (3:10). CT CERVICAL SPINE FINDINGS Alignment: Normal. Skull base and vertebrae: Multilevel moderate to severe degenerative changes spine with associated at least moderate left C4-C5 osseous neural foraminal stenosis. No severe osseous central canal stenosis. + No acute fracture. No aggressive appearing focal osseous lesion or focal pathologic process. Soft tissues and spinal canal: No prevertebral fluid or swelling. No visible canal hematoma. Upper chest: Unremarkable. Other: Atherosclerotic plaque of the aortic arch and its main branches. IMPRESSION: 1. No acute intracranial abnormality. 2. No acute displaced fracture or traumatic listhesis of the cervical spine. 3. Indeterminate left nasolacrimal duct enlargement. Correlate with clinical exam. 4.  Aortic Atherosclerosis (ICD10-I70.0). Electronically Signed   By: Tish Frederickson M.D.   On: 09/03/2023 22:40     Assessment and Plan:   Longstanding  persistent atrial fibrillation Secondary hypercoagulable state Patient found with atrial fibrillation in 2015, underwent DCCV but had recurrence of afib. Has seemingly been in rate permanent atrial fibrillation since that time, without symptoms, on regimen of Metoprolol and Diltiazem. Now admitted after at least 2-3 falls in the last week. Falls sound mechanical/tired/at times w/ change in position.  Telemetry shows no evidence of AV block or significant bradycardia/tachycardia.  2 isolated pauses noted, longest of which was 2.3 seconds while in Afib. BP stable.  Home CCB and BB have been held. Would not expect syncope/collapse from 2.3 second pause. More systemic symptoms of malaise/weakness are also not likely cardiac symptoms since no significant bradycardia seen/patient without dizziness and lightheadedness. Although this does not appear to be cardiac syncope, would be reasonable to discontinue Diltiazem and arrange outpatient monitor.  Continue home dose of Toprol XL 75mg  po qday Continue Eliquis but with caution. Given at least 2 recent falls, could consider outpatient workup for Watchman closure. Despite longstanding afib, patient's left atrium only mildly dilated.   Chronic diastolic heart failure Patient's chart has carried this diagnosis since at least 2018. No evidence of cardiomyopathy, has maintained normal LVEF. Has been stable/euvolemic over the years with dietary modification and lasix. Echo this admission shows LVEF 55-60%, mildly dilated RA. IVC with >50% respiratory variability/estimated RA pressure .  Although BNP minimally elevated to 152.3, elevated creatinine/BUN and physical exam suggest patient intravascularly dry->dehydrated. Agree with holding Lasix. At discharge reduce the dose of lasix from 40mg  po qday to 20mg  po qday  Acute malaise/fatigue After talking to patient's daughter-in-law Steve Jensen as noted above in HPI, with leukocytosis seen on labs, I have concern for  infectious process, albeit without clear source at this time. It would seem that patient's "syncope" may be a symptom rather than primary issue. I have reached out to primary team with this updated description of recent symptoms.  Procalcitonin ordered. Will defer further infectious workup to primary team.   Per primary team: AKI on CKD IIIa Hyperkalemia  Risk Assessment/Risk Scores:    New York Heart Association (NYHA) Functional Class NYHA Class I  CHA2DS2-VASc Score = 5   This indicates a 7.2% annual risk of stroke. The patient's score is based upon: CHF History: 1 HTN History: 1 Diabetes History: 1 Stroke History: 0 Vascular Disease History: 0 Age Score: 2 Gender Score: 0  For questions or updates, please contact [facility] Please consult [email or web address] for contact info under    Signed, Perlie Gold, PA-C  09/04/2023 12:26 PM  ADDENDUM:   Patient seen and examined.  I personally taken a history, examined the patient, reviewed relevant notes,  laboratory  data / imaging studies.  I performed a substantive portion of this encounter and formulated the important aspects of the plan.  I agree with the APP's note, impression, and recommendations; however, I have edited the note to reflect changes or salient points.   Patient presents to the hospital due to recurrent falls secondary to feeling tired, fatigued, reduced energy. Patient is accompanied by his daughter-in-law bedside. Cardiology consulted for evaluation of syncope.  When asking additional questions patient and daughter-in-law both endorse that he has not lost consciousness during any of his episodes.  Therefore, symptoms are not truly syncopal.  However, recently has been feeling tired, fatigued, short of breath with effort related activities, at times symptoms exacerbated by changing positions.  He denies orthopnea or PND.  Patient has known history of atrial fibrillation which appears to be longstanding  persistent currently on Cardizem 300 mg p.o. daily and Toprol-XL 75 mg p.o. daily.  He is on anticoagulation with Eliquis without interruptions, per patient and daughter-in-law  PHYSICAL EXAM: Today's Vitals   09/04/23 1103 09/04/23 1200 09/04/23 1423 09/04/23 1431  BP: 120/89   122/74  Pulse: 73   79  Resp: 16   16  Temp:  97.7 F (36.5 C)  97.6 F (36.4 C)  TempSrc:  Oral  Oral  SpO2: 100%     Weight:      Height:      PainSc:   0-No pain    Body mass index is 40.32 kg/m.   Net IO Since Admission: No IO data has been entered for this period [09/04/23 1649]  Filed Weights   09/03/23 2046  Weight: 127.5 kg    Physical Exam  Constitutional: No distress. He appears chronically ill.  hemodynamically stable  Neck: No JVD present.  Cardiovascular: Normal rate, S1 normal and S2 normal. An irregularly irregular rhythm present. Exam reveals no gallop, no S3 and no S4.  No murmur heard. Pulses:      Dorsalis pedis pulses are 1+ on the right side and 1+ on the left side.       Posterior tibial pulses are 2+ on the right side and 2+ on the left side.  Pulmonary/Chest: Effort normal and breath sounds normal. No stridor. He has no wheezes. He has no rales.  Abdominal: Soft. He exhibits no distension. There is no abdominal tenderness.  Obese  Musculoskeletal:        General: Edema (Trace bilaterally, varicosities present.) present.     Cervical back: Neck supple.  Skin: Skin is warm.    EKG: (personally reviewed by me) 09/04/2023: Atrial fibrillation, 63 bpm, without underlying ischemia or injury pattern.  Telemetry: (personally reviewed by me) Rate controlled atrial fibrillation   Impression:  Persistent atrial fibrillation, history of direct-current cardioversion x 1 Long-term oral anticoagulation Chronic compensated HFpEF Recent falls/tired, fatigued, malaise Acute kidney injury on chronic kidney disease -creatinine on arrival 2.1 Hyperkalemia -on MRA and potassium  supplements Sleep apnea on CPAP Leukocytosis  Recommendations:  Cardiology is consulted for syncope evaluation.  Further interviewing the patient and daughter-in-law at bedside patient has not had a syncopal event in the recent past.  Patient endorses that he has never lost consciousness.  He has been experiencing generalized tired, fatigue, malaise -which could be metabolic or secondary to AV node blocking agents he is on 300 mg of Cardizem and metoprolol succinate 75 mg p.o. twice daily.  He also presents with acute kidney injury to suggest dehydration as a contributing factor.  Though he  does not have any prodromal symptoms he did experience at times when he stands up quickly he gets symptomatic.  Orthostatic vital signs have been ordered multiple times but not performed, please follow-up  Echocardiogram notes preserved LVEF without any significant valvular heart disease.  Telemetry notes rate controlled atrial fibrillation without sustained arrhythmias or prolonged pauses. Monitor Telemetry  Carotid duplex illustrates bilateral atherosclerosis without any significant stenosis, final report pending  On clinical examination he appears to be volume depleted and also has AKI.  Will hold Lasix for now and recommend resuming Lasix at half the dose of 20 mg p.o. daily at the time of discharge.  Since his atrial fibrillation is rate control we will still continue to give Toprol-XL 75 mg p.o. daily and the medication can be uptitrated as needed.  Will hold Cardizem 300 mg p.o. daily.  If no unifying diagnosis is made consider a cardiac monitor at the time of discharge.  Patient states that he has been taking his Eliquis without fail, no interruption, well score suggestive of low probability for VTE.  Recommend checking TSH, vitamin D, B12, and infectious disease workup.  Further recommendations to follow as the case evolves.  As part of this consult reviewed progress note from ED/primary team,  EKG, labs September 03, 2023 and September 04 2023, echocardiogram results, medication changes as outlined above, ordered additional diagnostic testing.  Plan of care discussed with the patient as well as his daughter-in-law.   This note was created using a voice recognition software as a result there may be grammatical errors inadvertently enclosed that do not reflect the nature of this encounter. Every attempt is made to correct such errors.   Tessa Lerner, DO, [facility]  [address] [place], [place] [address] Pager: [phone] Office: [phone] 09/04/2023 4:49 PM

## 2023-09-04 NOTE — H&P (Signed)
 History and Physical    Steve Jensen ZOX:[id number] DOB: 1941-02-09 DOA: 09/03/2023  PCP: Lupita Raider, MD  Patient coming from: Home  Chief Complaint: Fall  HPI: Steve Jensen is a 83 y.o. male with medical history significant of A-fib on Eliquis, hypertension, chronic HFpEF, OSA on CPAP, obesity, type 2 diabetes, CKD stage IIIa, GERD, hepatocellular carcinoma seen by IR, history of prostate cancer presented to ED after a fall and patient struck the back of his head.  Patient was awake, alert, and interactive with EMS throughout transport and hemodynamically stable.  Labs notable for WBC count 19.5, hemoglobin 14.3, platelet count 509k, potassium 6.3, glucose 184, BUN 39, creatinine 2.1 (baseline 1.0-1.2), ethanol level <10, BNP 152, UA not suggestive of infection.  Chest x-ray showing no active disease.  X-ray of pelvis negative for acute traumatic injury.  CT head negative for acute intracranial abnormality.  CT C-spine negative for acute traumatic injuries.  EKG showing rate controlled A-fib, artifact, and no STEMI. Patient was given albuterol neb, IV insulin/dextrose, Lokelma, IV calcium gluconate, and 500 mL normal saline in the ED.  [facility] called admit.  Patient states he had a fall at home yesterday evening and hit the back of his head.  However, he does not remember how this happened.  He does not remember slipping and falling.  Denies preceding lightheadedness/dizziness, chest pain, or palpitations.  He is endorsing dyspnea on exertion and cough for the past few days.  Denies fevers, nausea, vomiting, abdominal pain, diarrhea, or any urinary symptoms.  He is on Eliquis due to history of A-fib.  He is reporting bilateral knee and elbow abrasions from a fall a week ago.  He was in his yard at that time and his dog ran in front of him causing him to trip and fall.  He has been able to walk since after the fall and not endorsing any pain.  Review of Systems:  Review of Systems  All other  systems reviewed and are negative.   Past Medical History:  Diagnosis Date   Anemia    Diabetes mellitus type II, controlled (HCC)    with neuropathy   Dysrhythmia    A-Fib. cardioversion done   GERD (gastroesophageal reflux disease)    Hyperlipidemia    Hypertension    Morbid obesity (HCC)    Neuromuscular disorder (HCC)    feet   OSA (obstructive sleep apnea)    On BiPAP at 15/11cm H2O   Prostate cancer (HCC)    Shingles    on face Nov 2010   Urinary incontinence     Past Surgical History:  Procedure Laterality Date   CARDIOVERSION N/A 09/06/2013   Procedure: CARDIOVERSION;  Surgeon: Corky Crafts, MD;  Location: [facility] ENDOSCOPY;  Service: Cardiovascular;  Laterality: N/A;   EYE SURGERY     cataract surgery bilat; surgery to correct droopy eyelids    FLEXIBLE SIGMOIDOSCOPY N/A 12/24/2021   Procedure: FLEXIBLE SIGMOIDOSCOPY;  Surgeon: Dolores Frame, MD;  Location: [facility] ENDO SUITE;  Service: Gastroenterology;  Laterality: N/A;   IR ANGIOGRAM SELECTIVE EACH ADDITIONAL VESSEL  02/15/2018   IR ANGIOGRAM SELECTIVE EACH ADDITIONAL VESSEL  02/15/2018   IR ANGIOGRAM SELECTIVE EACH ADDITIONAL VESSEL  02/15/2018   IR ANGIOGRAM SELECTIVE EACH ADDITIONAL VESSEL  02/15/2018   IR ANGIOGRAM SELECTIVE EACH ADDITIONAL VESSEL  02/15/2018   IR ANGIOGRAM VISCERAL SELECTIVE  02/15/2018   IR ANGIOGRAM VISCERAL SELECTIVE  02/15/2018   IR EMBO TUMOR ORGAN ISCHEMIA INFARCT INC GUIDE ROADMAPPING  02/15/2018   IR RADIOLOGIST EVAL & MGMT  01/26/2018   IR RADIOLOGIST EVAL & MGMT  03/17/2018   IR RADIOLOGIST EVAL & MGMT  06/29/2018   IR RADIOLOGIST EVAL & MGMT  07/27/2018   IR RADIOLOGIST EVAL & MGMT  10/28/2018   IR RADIOLOGIST EVAL & MGMT  12/14/2018   IR RADIOLOGIST EVAL & MGMT  03/17/2019   IR RADIOLOGIST EVAL & MGMT  07/14/2019   IR RADIOLOGIST EVAL & MGMT  12/27/2019   IR RADIOLOGIST EVAL & MGMT  08/21/2020   IR RADIOLOGIST EVAL & MGMT  02/28/2021   IR RADIOLOGIST EVAL & MGMT  06/25/2021   IR  RADIOLOGIST EVAL & MGMT  10/18/2021   IR US GUIDE VASC ACCESS RIGHT  02/15/2018   knee surgery bilat      PROSTATE SURGERY     RADIOLOGY WITH ANESTHESIA N/A 11/24/2018   Procedure: MICROWAVE THERMAL ABLATION LIVER;  Surgeon: Simonne Come, MD;  Location: [facility] ORS;  Service: Anesthesiology;  Laterality: N/A;   right shoulder rotator cuff surgery     TONSILLECTOMY       reports that he quit smoking about 48 years ago. His smoking use included cigarettes. He started smoking about 63 years ago. He has a 15 pack-year smoking history. He has never used smokeless tobacco. He reports that he does not drink alcohol and does not use drugs.  Allergies  Allergen Reactions   Lipitor [Atorvastatin] Other (See Comments)    Muscle aches   Simvastatin Other (See Comments)    Muscle aches   Zithromax [Azithromycin] Other (See Comments)    GI-SIDE EFFECTS   Amlodipine Swelling    Family History  Problem Relation Age of Onset   Hypertension Brother    Diabetes Brother    Cancer Brother        bladder cancer   Diabetes Brother    CVA Brother    Hypertension Brother    Prostate cancer Brother    Diabetes Brother    Hypertension Brother    Heart disease Brother        CABG   Heart attack Brother    Stroke Brother     Prior to Admission medications   Medication Sig Start Date End Date Taking? Authorizing Provider  acetaminophen (TYLENOL) 500 MG tablet Take 2 tablets (1,000 mg total) by mouth every 8 (eight) hours as needed for mild pain or headache (or Fever >/= 101). 12/27/21  Yes Vassie Loll, MD  allopurinol (ZYLOPRIM) 100 MG tablet Take 100 mg by mouth daily. 04/17/21  Yes [provider]  apixaban (ELIQUIS) 5 MG TABS tablet TAKE 1 TABLET(5 MG) BY MOUTH TWICE DAILY Patient taking differently: Take 5 mg by mouth 2 (two) times daily. 11/15/18  Yes Turner, Cornelious Bryant, MD  benzonatate (TESSALON) 100 MG capsule Take 100 mg by mouth 3 (three) times daily as needed. 06/08/23  Yes [provider]  dextromethorphan (DELSYM) 30 MG/5ML liquid Take 5 mLs (30 mg total) by mouth 2 (two) times daily as needed for cough. Patient not taking: Reported on 09/04/2023 11/15/22   Cleora Fleet, MD  diclofenac Sodium (VOLTAREN) 1 % GEL Apply 4 g topically 4 (four) times daily. Patient not taking: Reported on 09/04/2023 12/27/21   Vassie Loll, MD  diltiazem (CARDIZEM CD) 300 MG 24 hr capsule TAKE 1 CAPSULE BY MOUTH EVERY DAY 02/19/23   Corky Crafts, MD  fish oil-omega-3 fatty acids 1000 MG capsule Take 2 g by mouth 2 (two) times  daily.     [provider]  FLUoxetine (PROZAC) 10 MG capsule Take 10 mg by mouth every evening. 04/14/23   [provider]  fluticasone (FLONASE) 50 MCG/ACT nasal spray Place 1 spray into both nostrils daily. 11/23/22   [provider]  furosemide (LASIX) 40 MG tablet Take 40 mg by mouth daily.    [provider]  insulin aspart (NOVOLOG) 100 UNIT/ML injection Inject 5 Units into the skin 3 (three) times daily with meals. GIVE ONLY IF EATS 50% OR MORE OF MEAL. 11/15/22   Johnson, Clanford L, MD  insulin degludec (TRESIBA FLEXTOUCH) 100 UNIT/ML FlexTouch Pen Inject 15 Units into the skin daily.    [provider]  ipratropium-albuterol (DUONEB) 0.5-2.5 (3) MG/3ML SOLN Take 3 mLs by nebulization every 4 (four) hours as needed (wheezing, coughing, SOB). 11/15/22   Johnson, Clanford L, MD  ipratropium-albuterol (DUONEB) 0.5-2.5 (3) MG/3ML SOLN Take 3 mLs by nebulization in the morning and at bedtime. 11/15/22   Johnson, Clanford L, MD  loperamide (IMODIUM) 2 MG capsule Take 1 capsule (2 mg total) by mouth 3 (three) times daily as needed for diarrhea or loose stools. 11/15/22   Johnson, Clanford L, MD  loratadine (CLARITIN) 10 MG tablet Take 10 mg by mouth daily as needed for allergies.     [provider]  melatonin 5 MG TABS Take 5 mg by mouth at bedtime. 11/17/22   [provider]  metFORMIN  (GLUCOPHAGE) 1000 MG tablet Take 1,000 mg by mouth 2 (two) times daily. 06/27/14   [provider]  metoprolol succinate (TOPROL-XL) 50 MG 24 hr tablet Take 1.5 tablets (75 mg total) by mouth daily. 11/15/22   Johnson, Clanford L, MD  omeprazole (PRILOSEC) 20 MG capsule Take 1 capsule (20 mg total) by mouth daily. 11/15/22   Johnson, Clanford L, MD  phenol (CHLORASEPTIC) 1.4 % LIQD Use as directed 1 spray in the mouth or throat as needed for throat irritation / pain. 11/15/22   Johnson, Clanford L, MD  polyethylene glycol (MIRALAX / GLYCOLAX) 17 g packet Take 17 g by mouth daily as needed. 11/15/22   Johnson, Clanford L, MD  potassium chloride SA (KLOR-CON M) 20 MEQ tablet Take 1 tablet (20 mEq total) by mouth daily. 11/15/22   Johnson, Clanford L, MD  pregabalin (LYRICA) 100 MG capsule Take 100 mg by mouth 2 (two) times a day.  09/14/18   [provider]  saccharomyces boulardii (FLORASTOR) 250 MG capsule Take 1 capsule (250 mg total) by mouth 2 (two) times daily. 11/15/22   Johnson, Clanford L, MD  spironolactone (ALDACTONE) 50 MG tablet Take 50 mg by mouth every morning. 03/23/23   [provider]  XULTOPHY 100-3.6 UNIT-MG/ML SOPN 17U Subcutaneous daily (titrate up to Crystal of 50U daily) 12/08/22   [provider]  ZETIA 10 MG tablet Take 10 mg by mouth daily.  01/17/13   [provider]    Physical Exam: Vitals:   09/04/23 0100 09/04/23 0130 09/04/23 0200 09/04/23 0247  BP: 112/65 110/87 126/65   Pulse: 66 70 84   Resp:      Temp:    98.2 F (36.8 C)  TempSrc:    Oral  SpO2: 97% 98% 95%   Weight:      Height:        Physical Exam Vitals reviewed.  Constitutional:      General: He is not in acute distress. HENT:     Head: Normocephalic and atraumatic.  Eyes:     Extraocular Movements: Extraocular movements intact.  Cardiovascular:     Rate and Rhythm: Normal rate and regular rhythm.     Pulses: Normal pulses.  Pulmonary:     Effort: Pulmonary  effort is normal. No respiratory distress.     Breath sounds: Normal breath sounds. No wheezing or rales.  Abdominal:     General: Bowel sounds are normal.     Palpations: Abdomen is soft.     Tenderness: There is no abdominal tenderness. There is no guarding.  Musculoskeletal:     Cervical back: Normal range of motion.     Right lower leg: No edema.     Left lower leg: No edema.  Skin:    General: Skin is warm and dry.     Comments: Bilateral elbow and knee skin abrasions.  Bilateral elbow abrasions appear to be healing well. Left knee abrasion appears to be healing.  However, right knee abrasion with surrounding erythema and warmth concerning for possible cellulitis.  Neurological:     General: No focal deficit present.     Mental Status: He is alert and oriented to person, place, and time.     Cranial Nerves: No cranial nerve deficit.     Sensory: No sensory deficit.     Motor: No weakness.     Labs on Admission: I have personally reviewed following labs and imaging studies  CBC: Recent Labs  Lab 09/03/23 2103  WBC 19.5*  HGB 14.3  HCT 44.5  MCV 82.4  PLT 509*   Basic Metabolic Panel: Recent Labs  Lab 09/03/23 2103  NA 135  K 6.3*  CL 99  CO2 23  GLUCOSE 184*  BUN 39*  CREATININE 2.12*  CALCIUM 9.5   GFR: Estimated Creatinine Clearance: 36.6 mL/min (A) (by C-G formula based on SCr of 2.12 mg/dL (H)). Liver Function Tests: Recent Labs  Lab 09/03/23 2103  AST 31  ALT 20  ALKPHOS 91  BILITOT 0.4  PROT 7.0  ALBUMIN 3.1*   No results for input(s): "LIPASE", "AMYLASE" in the last 168 hours. No results for input(s): "AMMONIA" in the last 168 hours. Coagulation Profile: Recent Labs  Lab 09/03/23 2103  INR 1.3*   Cardiac Enzymes: No results for input(s): "CKTOTAL", "CKMB", "CKMBINDEX", "TROPONINI" in the last 168 hours. BNP (last 3 results) No results for input(s): "PROBNP" in the last 8760 hours. HbA1C: No results for input(s): "HGBA1C" in the last  72 hours. CBG: No results for input(s): "GLUCAP" in the last 168 hours. Lipid Profile: No results for input(s): "CHOL", "HDL", "LDLCALC", "TRIG", "CHOLHDL", "LDLDIRECT" in the last 72 hours. Thyroid Function Tests: No results for input(s): "TSH", "T4TOTAL", "FREET4", "T3FREE", "THYROIDAB" in the last 72 hours. Anemia Panel: No results for input(s): "VITAMINB12", "FOLATE", "FERRITIN", "TIBC", "IRON", "RETICCTPCT" in the last 72 hours. Urine analysis:    Component Value Date/Time   COLORURINE YELLOW 12/24/2021 0444   APPEARANCEUR CLOUDY (A) 12/24/2021 0444   LABSPEC 1.036 (H) 12/24/2021 0444   PHURINE 5.0 12/24/2021 0444   GLUCOSEU NEGATIVE 12/24/2021 0444   HGBUR NEGATIVE 12/24/2021 0444   BILIRUBINUR NEGATIVE 12/24/2021 0444   KETONESUR 5 (A) 12/24/2021 0444   PROTEINUR 30 (A) 12/24/2021 0444   NITRITE NEGATIVE 12/24/2021 0444   LEUKOCYTESUR NEGATIVE 12/24/2021 0444    Radiological Exams on Admission: DG Chest Port 1 View Result Date: 09/03/2023 CLINICAL DATA:  Trauma EXAM: PORTABLE CHEST 1 VIEW COMPARISON:  Chest x-ray 11/13/2022 FINDINGS: The heart and mediastinal contours are unchanged.  Atherosclerotic plaque. No focal consolidation. No pulmonary edema. No pleural effusion. No pneumothorax. No acute osseous abnormality. IMPRESSION: 1. No active disease. 2.  Aortic Atherosclerosis (ICD10-I70.0). Electronically Signed   By: Tish Frederickson M.D.   On: 09/03/2023 22:43   DG Pelvis Portable Result Date: 09/03/2023 CLINICAL DATA:  Trauma EXAM: PORTABLE PELVIS 1-2 VIEWS COMPARISON:  X-ray pelvis 12/23/2021 FINDINGS: Right femoral trochanter collimated off view. No acute displaced fracture or dislocation of the hips. There is no evidence of pelvic fracture or diastasis. No pelvic bone lesions are seen. Surgical clips overlie the pelvis. IMPRESSION: 1.  Negative for acute traumatic injury. 2. Right femoral trochanter collimated off view. Electronically Signed   By: Tish Frederickson M.D.   On:  09/03/2023 22:42   CT HEAD WO CONTRAST Result Date: 09/03/2023 CLINICAL DATA:  Pt fell today and hit the back of his head on the floor. Alert and oriented, but does not remember the fall. No obvious injury. Patient denies pain. EXAM: CT HEAD WITHOUT CONTRAST CT CERVICAL SPINE WITHOUT CONTRAST TECHNIQUE: Multidetector CT imaging of the head and cervical spine was performed following the standard protocol without intravenous contrast. Multiplanar CT image reconstructions of the cervical spine were also generated. RADIATION DOSE REDUCTION: This exam was performed according to the departmental dose-optimization program which includes automated exposure control, adjustment of the mA and/or kV according to patient size and/or use of iterative reconstruction technique. COMPARISON:  CT head 11/11/2022 FINDINGS: CT HEAD FINDINGS Brain: Cerebral ventricle sizes are concordant with the degree of cerebral volume loss. Patchy and confluent areas of decreased attenuation are noted throughout the deep and periventricular white matter of the cerebral hemispheres bilaterally, compatible with chronic microvascular ischemic disease. No evidence of large-territorial acute infarction. No parenchymal hemorrhage. No mass lesion. No extra-axial collection. No mass effect or midline shift. No hydrocephalus. Basilar cisterns are patent. Vascular: No hyperdense vessel. Atherosclerotic calcifications are present within the cavernous internal carotid arteries. Skull: No acute fracture or focal lesion. Sinuses/Orbits: Paranasal sinuses and mastoid air cells are clear. Bilateral lens replacement. Otherwise the orbits are unremarkable. Other: Left nasolacrimal duct enlargement (3:10). CT CERVICAL SPINE FINDINGS Alignment: Normal. Skull base and vertebrae: Multilevel moderate to severe degenerative changes spine with associated at least moderate left C4-C5 osseous neural foraminal stenosis. No severe osseous central canal stenosis. + No acute  fracture. No aggressive appearing focal osseous lesion or focal pathologic process. Soft tissues and spinal canal: No prevertebral fluid or swelling. No visible canal hematoma. Upper chest: Unremarkable. Other: Atherosclerotic plaque of the aortic arch and its main branches. IMPRESSION: 1. No acute intracranial abnormality. 2. No acute displaced fracture or traumatic listhesis of the cervical spine. 3. Indeterminate left nasolacrimal duct enlargement. Correlate with clinical exam. 4.  Aortic Atherosclerosis (ICD10-I70.0). Electronically Signed   By: Tish Frederickson M.D.   On: 09/03/2023 22:40   CT CERVICAL SPINE WO CONTRAST Result Date: 09/03/2023 CLINICAL DATA:  Pt fell today and hit the back of his head on the floor. Alert and oriented, but does not remember the fall. No obvious injury. Patient denies pain. EXAM: CT HEAD WITHOUT CONTRAST CT CERVICAL SPINE WITHOUT CONTRAST TECHNIQUE: Multidetector CT imaging of the head and cervical spine was performed following the standard protocol without intravenous contrast. Multiplanar CT image reconstructions of the cervical spine were also generated. RADIATION DOSE REDUCTION: This exam was performed according to the departmental dose-optimization program which includes automated exposure control, adjustment of the mA and/or kV according to patient size and/or use of iterative  reconstruction technique. COMPARISON:  CT head 11/11/2022 FINDINGS: CT HEAD FINDINGS Brain: Cerebral ventricle sizes are concordant with the degree of cerebral volume loss. Patchy and confluent areas of decreased attenuation are noted throughout the deep and periventricular white matter of the cerebral hemispheres bilaterally, compatible with chronic microvascular ischemic disease. No evidence of large-territorial acute infarction. No parenchymal hemorrhage. No mass lesion. No extra-axial collection. No mass effect or midline shift. No hydrocephalus. Basilar cisterns are patent. Vascular: No  hyperdense vessel. Atherosclerotic calcifications are present within the cavernous internal carotid arteries. Skull: No acute fracture or focal lesion. Sinuses/Orbits: Paranasal sinuses and mastoid air cells are clear. Bilateral lens replacement. Otherwise the orbits are unremarkable. Other: Left nasolacrimal duct enlargement (3:10). CT CERVICAL SPINE FINDINGS Alignment: Normal. Skull base and vertebrae: Multilevel moderate to severe degenerative changes spine with associated at least moderate left C4-C5 osseous neural foraminal stenosis. No severe osseous central canal stenosis. + No acute fracture. No aggressive appearing focal osseous lesion or focal pathologic process. Soft tissues and spinal canal: No prevertebral fluid or swelling. No visible canal hematoma. Upper chest: Unremarkable. Other: Atherosclerotic plaque of the aortic arch and its main branches. IMPRESSION: 1. No acute intracranial abnormality. 2. No acute displaced fracture or traumatic listhesis of the cervical spine. 3. Indeterminate left nasolacrimal duct enlargement. Correlate with clinical exam. 4.  Aortic Atherosclerosis (ICD10-I70.0). Electronically Signed   By: Tish Frederickson M.D.   On: 09/03/2023 22:40    Assessment and Plan  Syncope EKG showing rate controlled A-fib and no STEMI.  Patient is not endorsing chest pain.  He is reporting dyspnea on exertion.  Chest x-ray showing no active disease.  PE less likely as he is chronically anticoagulated with Eliquis and reports compliance.  He is not tachycardic or hypoxic.  CT head negative for acute intracranial abnormality and no focal neurodeficit on exam.  Not hypoglycemic. ?Orthostatic syncope as he is on a diuretic. -Cardiac monitoring -Hold diuretic/antihypertensives at this time and check orthostatics -Check troponin -Echocardiogram -Carotid Dopplers -EEG -PT/OT eval, fall precautions  Right knee skin abrasion with possible cellulitis Secondary to mechanical fall a week  ago.  Does have leukocytosis on labs.  Start doxycycline and monitor closely.  Monitor WBC count.  Wound care.  AKI on CKD stage IIIa Possibly prerenal from dehydration.  BUN 39, creatinine 2.1 (baseline 1.0-1.2).  Continue IV fluid hydration and monitor renal function.  Avoid nephrotoxic agents/hold home Lasix and spironolactone.  Hyperkalemia In the setting of AKI and spironolactone use.  Patient received treatment in the ED and repeat labs have been ordered to check potassium level.  Hold spironolactone at this time.  A-fib Currently rate controlled.  Holding metoprolol and diltiazem until orthostatics checked.  Holding Eliquis overnight and resume in the morning if patient continues to remain stable and no change in neurologic exam.  Pharmacy med rec currently pending.  Thrombocytosis Repeat CBC ordered to confirm.  Insulin-dependent type 2 diabetes Glucose in the 180s.  Last A1c 8.0 in June 2024, repeat ordered.  Placed on moderate sliding scale insulin ACHS.  Resume home basal insulin after pharmacy med rec is done.  Chronic HFpEF Last echo done in June 2024 showing EF 55 to 60%, mild MR, mild AR.  BNP 152.  Patient does not appear volume overloaded on exam.  Hypertension Hold antihypertensives at this time and check orthostatics.  OSA Continue nightly CPAP.  Code Status: DNR/DNI (discussed with the patient) Family Communication: No family available at this time. Level of care: Telemetry  bed Admission status: It is my clinical opinion that referral for OBSERVATION is reasonable and necessary in this patient based on the above information provided. The aforementioned taken together are felt to place the patient at high risk for further clinical deterioration. However, it is anticipated that the patient may be medically stable for discharge from the hospital within 24 to 48 hours.  John Giovanni MD [facility]  If 7PM-7AM, please contact  night-coverage [email or web address]  09/04/2023, 3:49 AM

## 2023-09-04 NOTE — Progress Notes (Signed)
 OT Cancellation Note  Patient Details Name: Steve Jensen MRN: [id number] DOB: [birth year]   Cancelled Treatment:    Reason Eval/Treat Not Completed: Other (comment).  Transitioning to 6E from the ED.    Kharon Hixon D Dimas Scheck 09/04/2023, 3:10 PM 09/04/2023  RP, OTR/L  Acute Rehabilitation Services  Office:  [phone]

## 2023-09-04 NOTE — Evaluation (Signed)
 Physical Therapy Evaluation Patient Details Name: Steve Jensen MRN: [id number] DOB: 1941/02/24 Today's Date: 09/04/2023  History of Present Illness  Pt is 83 yo presenting to [facility] ED on 4/10 due to fall which involved striking the back of his head. No significant findings on CT scans. PMH: a-fib, HTN, chronic HF-EF, OSA on CPAP, DM II, CKD III, GERD, hepatocellular carcinoma, prostate cancer.  Clinical Impression  Pt is presenting slightly below baseline. Pt seems to be limited at this time mostly by pain in the LB due to previous fall when he tripped over his dog. Min A for bed mobility (pt can sleep and does sleep in lift recliner at home intermittently), CGA for sit to stand with RW with verbal cues for safety, CGA for side stepping. Unable to progress gait due to transportation for imaging. Pt demonstrates strength for short in home distances at [facility] to supervision level with an AD as evidenced by sit to stand and side stepping. Pt has family at home 24/7. Due to pt current functional status, home set up and available assistance at home recommending skilled physical therapy services 3x/week in order to address strength, balance and functional mobility to decrease risk for falls, injury and re-hospitalization.           If plan is discharge home, recommend the following: A little help with walking and/or transfers;Help with stairs or ramp for entrance;Assist for transportation;Assistance with cooking/housework     Equipment Recommendations None recommended by PT     Functional Status Assessment Patient has had a recent decline in their functional status and demonstrates the ability to make significant improvements in function in a reasonable and predictable amount of time.     Precautions / Restrictions Precautions Precautions: Fall Restrictions Weight Bearing Restrictions Per Provider Order: No      Mobility  Bed Mobility Overal bed mobility: Needs Assistance Bed Mobility: Supine to  Sit, Sit to Supine     Supine to sit: Min assist Sit to supine: Min assist   General bed mobility comments: Min A at trunk to get to mid line with increased time and MIn A at LE for sitting to supine in order to get LE into bed. Pt states that he will sleep in recliner when he feels weak and in the bed when he is feeling good.    Transfers Overall transfer level: Needs assistance Equipment used: Rolling walker (2 wheels) Transfers: Sit to/from Stand Sit to Stand: Contact guard assist           General transfer comment: verbal cues both times for 2x sit to stand for safe hand placement in order to prevent pulling up from RW.    Ambulation/Gait               General Gait Details: unable to assess today. Pt was able to take side steps at [facility] for safety. Transportation there for ECHO and needs pt to come.      Balance Overall balance assessment: Mild deficits observed, not formally tested         Pertinent Vitals/Pain Pain Assessment Pain Assessment: Faces Faces Pain Scale: Hurts even more Pain Location: low back in standing Pain Descriptors / Indicators: Aching Pain Intervention(s): Monitored during session    Home Living Family/patient expects to be discharged to:: Private residence Living Arrangements: Children;Other relatives (lives with grandson and his family including granddaughter in [place] and children) Available Help at Discharge: Family;Available 24 hours/day Type of Home: House Home Access: Ramped entrance  Home Layout: Two level;Laundry or work area in basement;Able to live on main level with bedroom/bathroom Home Equipment: Agricultural consultant (2 wheels);Cane - single point;BSC/3in1;Grab bars - tub/shower;Rollator (4 wheels);Shower seat      Prior Function Prior Level of Function : Needs assist             Mobility Comments: Pt ambulates with RW mostly at home. Has had  3 falls in 6 months including one where he tripped over his dog. ADLs  Comments: Assisted with means, cleaning, laundry and transportation by family     Extremity/Trunk Assessment   Upper Extremity Assessment Upper Extremity Assessment: Overall WFL for tasks assessed    Lower Extremity Assessment Lower Extremity Assessment: Generalized weakness    Cervical / Trunk Assessment Cervical / Trunk Assessment: Kyphotic  Communication   Communication Communication: No apparent difficulties    Cognition Arousal: Alert Behavior During Therapy: WFL for tasks assessed/performed   PT - Cognitive impairments: No apparent impairments       Following commands: Intact       Cueing Cueing Techniques: Verbal cues     General Comments General comments (skin integrity, edema, etc.): abrasion on the R knee covered in bandaids. RN took orthostatics while in room; small drop in systolic BP wtihout a 20 point decrease. Pt denies dizziness/lightheadedness.        Assessment/Plan    PT Assessment Patient needs continued PT services  PT Problem List Decreased strength;Decreased mobility;Decreased balance;Decreased activity tolerance;Pain       PT Treatment Interventions DME instruction;Therapeutic exercise;Gait training;Balance training;Functional mobility training;Therapeutic activities;Patient/family education    PT Goals (Current goals can be found in the Care Plan section)  Acute Rehab PT Goals Patient Stated Goal: Stop falls, return home. PT Goal Formulation: With patient Time For Goal Achievement: 09/18/23 Potential to Achieve Goals: Good    Frequency Min 2X/week        AM-PAC PT "6 Clicks" Mobility  Outcome Measure Help needed turning from your back to your side while in a flat bed without using bedrails?: A Little Help needed moving from lying on your back to sitting on the side of a flat bed without using bedrails?: A Little Help needed moving to and from a bed to a chair (including a wheelchair)?: A Little Help needed standing up from a  chair using your arms (e.g., wheelchair or bedside chair)?: A Little Help needed to walk in hospital room?: A Little Help needed climbing 3-5 steps with a railing? : A Lot 6 Click Score: 17    End of Session Equipment Utilized During Treatment: Gait belt Activity Tolerance: Patient tolerated treatment well Patient left: in bed;Other (comment) (with transportation) Nurse Communication: Mobility status PT Visit Diagnosis: Unsteadiness on feet (R26.81);Other abnormalities of gait and mobility (R26.89);Repeated falls (R29.6);Muscle weakness (generalized) (M62.81)    Time: 1610-9604 PT Time Calculation (min) (ACUTE ONLY): 29 min   Charges:   PT Evaluation $PT Eval Low Complexity: 1 Low PT Treatments $Therapeutic Activity: 8-22 mins PT General Charges $$ ACUTE PT VISIT: 1 Visit       Harrel Carina, DPT, CLT  Acute Rehabilitation Services Office: [phone] (Secure chat preferred)   Claudia Desanctis 09/04/2023, 9:56 AM

## 2023-09-04 NOTE — Progress Notes (Signed)
 PROGRESS NOTE    Steve Jensen  ZOX:[id number] DOB: Mar 28, 1941 DOA: 09/03/2023 PCP: Lupita Raider, MD    Brief Narrative:  83 year old with history of A-fib on Eliquis, HTN, chronic CHF with preserved EF, OSA on CPAP, DM2, obesity, CKD 3A, hepatocellular carcinoma, GERD, prostate cancer comes to the ED after a fall and struck the back of the head.  Upon admission trauma workup including CT head and cervical spine were negative.  Was found to have AKI and hyperkalemia.  Patient was given Lokelma, IV calcium gluconate, insulin and D50, nebulizer.   Assessment & Plan:  Principal Problem:   Syncope Active Problems:   Type 2 diabetes mellitus (HCC)   OSA (obstructive sleep apnea)   Atrial fibrillation (HCC)   Cellulitis   AKI (acute kidney injury) (HCC)   Hyperkalemia    Syncope Unclear etiology.  Chest x-ray is negative.  EKG showing rate controlled atrial fibrillation.  Had another fall about a week ago, we can possibly consider ambulatory cardiac monitor - Echocardiogram, carotid Dopplers - PT/OT - Check TSH, orthostatics, cortisol  A-fib, chronic Currently rate controlled.  Metoprolol and Cardizem on hold.  Wonder if his medications are causing intermittent bradycardia worsening his orthostasis.  Will consult cardiology as patient used to follow their service and will need outpatient follow-up.    Right knee skin abrasion with possible cellulitis Secondary to mechanical fall a week ago.  Does have leukocytosis on labs.  Start doxycycline and monitor closely.  Monitor WBC count.  Wound care.   AKI on CKD stage IIIa Prerenal in nature.  Baseline creatinine 1.2, admission 2.12, improving with IV fluids.   Hyperkalemia Received treatment in the ER, improved      Thrombocytosis Repeat CBC ordered to confirm.   Insulin-dependent type 2 diabetes A1c 7.5.  Sliding scale and Accu-Chek.   Chronic HFpEF Appears to be euvolemic.  Prior history of preserved EF.  Update  echocardiogram has been ordered by admitting provider   Hypertension Hold antihypertensives at this time and check orthostatics.   OSA Continue nightly CPAP.  History of hepatocellular carcinoma - Underwent micro ablation and transcatheter embolization about 5 years ago.  Currently under yearly surveillance     DVT prophylaxis:       Code Status: Limited: Do not attempt resuscitation (DNR) -DNR-LIMITED -Do Not Intubate/DNI  Family Communication:   Continue hospital stay due to feeling lightheadedness, dizzy    Subjective: Seen at bedside.  Tells me he feels little better today but does not remember what happened yesterday before passing out.  Denies any chest pain, palpitations, lightheadedness, dizziness. Also reports had a fall a week ago but that was more mechanical fall while playing with her dog.   Examination:  General exam: Appears calm and comfortable  Respiratory system: Clear to auscultation. Respiratory effort normal. Cardiovascular system: S1 & S2 heard, RRR. No JVD, murmurs, rubs, gallops or clicks. No pedal edema. Gastrointestinal system: Abdomen is nondistended, soft and nontender. No organomegaly or masses felt. Normal bowel sounds heard. Central nervous system: Alert and oriented. No focal neurological deficits. Extremities: Symmetric 5 x 5 power. Skin: No rashes, lesions or ulcers Psychiatry: Judgement and insight appear normal. Mood & affect appropriate.                Diet Orders (From admission, onward)     Start     Ordered   09/04/23 0502  Diet heart healthy/carb modified Room service appropriate? Yes; Fluid consistency: Thin  Diet effective now  Question Answer Comment  Diet-HS Snack? Nothing   Room service appropriate? Yes   Fluid consistency: Thin      09/04/23 0503            Objective: Vitals:   09/04/23 0756 09/04/23 0900 09/04/23 0931 09/04/23 0936  BP:  (!) 113/57 (!) 113/56 107/66  Pulse:  74 66 86  Resp:   (!) 24 18   Temp: 97.9 F (36.6 C)     TempSrc: Oral     SpO2:  95% 96% 94%  Weight:      Height:       No intake or output data in the 24 hours ending 09/04/23 1218 Filed Weights   09/03/23 2046  Weight: 127.5 kg    Scheduled Meds:  doxycycline  100 mg Oral Q12H   insulin aspart  0-15 Units Subcutaneous TID WC   insulin aspart  0-5 Units Subcutaneous QHS   Continuous Infusions:  sodium chloride 50 mL/hr at 09/04/23 1610    Nutritional status     Body mass index is 40.32 kg/m.  Data Reviewed:   CBC: Recent Labs  Lab 09/03/23 2103 09/04/23 0516  WBC 19.5* 18.6*  HGB 14.3 13.2  HCT 44.5 43.5  MCV 82.4 87.2  PLT 509* 442*   Basic Metabolic Panel: Recent Labs  Lab 09/03/23 2103 09/04/23 0516  NA 135 137  K 6.3* 5.1  CL 99 103  CO2 23 21*  GLUCOSE 184* 123*  BUN 39* 35*  CREATININE 2.12* 1.77*  CALCIUM 9.5 9.3   GFR: Estimated Creatinine Clearance: 43.9 mL/min (A) (by C-G formula based on SCr of 1.77 mg/dL (H)). Liver Function Tests: Recent Labs  Lab 09/03/23 2103  AST 31  ALT 20  ALKPHOS 91  BILITOT 0.4  PROT 7.0  ALBUMIN 3.1*   No results for input(s): "LIPASE", "AMYLASE" in the last 168 hours. No results for input(s): "AMMONIA" in the last 168 hours. Coagulation Profile: Recent Labs  Lab 09/03/23 2103  INR 1.3*   Cardiac Enzymes: No results for input(s): "CKTOTAL", "CKMB", "CKMBINDEX", "TROPONINI" in the last 168 hours. BNP (last 3 results) No results for input(s): "PROBNP" in the last 8760 hours. HbA1C: Recent Labs    09/04/23 0516  HGBA1C 7.5*   CBG: Recent Labs  Lab 09/04/23 0755  GLUCAP 128*   Lipid Profile: No results for input(s): "CHOL", "HDL", "LDLCALC", "TRIG", "CHOLHDL", "LDLDIRECT" in the last 72 hours. Thyroid Function Tests: No results for input(s): "TSH", "T4TOTAL", "FREET4", "T3FREE", "THYROIDAB" in the last 72 hours. Anemia Panel: No results for input(s): "VITAMINB12", "FOLATE", "FERRITIN", "TIBC", "IRON",  "RETICCTPCT" in the last 72 hours. Sepsis Labs: No results for input(s): "PROCALCITON", "LATICACIDVEN" in the last 168 hours.  No results found for this or any previous visit (from the past 240 hours).       Radiology Studies: ECHOCARDIOGRAM COMPLETE Result Date: 09/04/2023    ECHOCARDIOGRAM REPORT   Patient Name:   Steve Jensen [facility] Date of Exam: 09/04/2023 Medical Rec #:  [id number]      Height:       70.0 in Accession #:    [id number]     Weight:       281.0 lb Date of Birth:  04-Jan-1941     BSA:          2.412 m Patient Age:    82 years       BP:           107/66 mmHg Patient Gender: M  HR:           95 bpm. Exam Location:  Inpatient Procedure: 2D Echo, Cardiac Doppler, Color Doppler and Intracardiac            Opacification Agent (Both Spectral and Color Flow Doppler were            utilized during procedure). Indications:    Syncope  History:        Patient has prior history of Echocardiogram examinations, most                 recent 11/12/2022. Arrythmias:Atrial Fibrillation; Risk                 Factors:Sleep Apnea, Dyslipidemia, Diabetes and Hypertension.  Sonographer:    Melton Krebs RDCS, FE, PE Referring Phys: [id number] VASUNDHRA RATHORE IMPRESSIONS  1. Left ventricular ejection fraction, by estimation, is 55 to 60%. The left ventricle has normal function. The left ventricle has no regional wall motion abnormalities. Left ventricular diastolic function could not be evaluated.  2. Right ventricular systolic function is mildly reduced. The right ventricular size is mildly enlarged. Tricuspid regurgitation signal is inadequate for assessing PA pressure.  3. Left atrial size was mildly dilated.  4. The mitral valve is normal in structure. Trivial mitral valve regurgitation. No evidence of mitral stenosis.  5. The aortic valve is calcified. There is moderate calcification of the aortic valve. There is mild thickening of the aortic valve. Aortic valve regurgitation is mild. Aortic valve  sclerosis/calcification is present, without any evidence of aortic stenosis.  6. The inferior vena cava is normal in size with greater than 50% respiratory variability, suggesting right atrial pressure of 3 mmHg. FINDINGS  Left Ventricle: Left ventricular ejection fraction, by estimation, is 55 to 60%. The left ventricle has normal function. The left ventricle has no regional wall motion abnormalities. Definity contrast agent was given IV to delineate the left ventricular  endocardial borders. The left ventricular internal cavity size was normal in size. There is no left ventricular hypertrophy. Left ventricular diastolic function could not be evaluated due to atrial fibrillation. Left ventricular diastolic function could  not be evaluated. Right Ventricle: Poorly visualized but does appear to have at least mildly reduced function. The right ventricular size is mildly enlarged. No increase in right ventricular wall thickness. Right ventricular systolic function is mildly reduced. Tricuspid regurgitation signal is inadequate for assessing PA pressure. Left Atrium: Left atrial size was mildly dilated. Right Atrium: Right atrial size was normal in size. Pericardium: There is no evidence of pericardial effusion. Mitral Valve: The mitral valve is normal in structure. Trivial mitral valve regurgitation. No evidence of mitral valve stenosis. Tricuspid Valve: The tricuspid valve is normal in structure. Tricuspid valve regurgitation is mild . No evidence of tricuspid stenosis. Aortic Valve: The aortic valve is calcified. There is moderate calcification of the aortic valve. There is mild thickening of the aortic valve. Aortic valve regurgitation is mild. Aortic valve sclerosis/calcification is present, without any evidence of aortic stenosis. Aortic valve mean gradient measures 3.0 mmHg. Aortic valve peak gradient measures 5.1 mmHg. Aortic valve area, by VTI measures 3.75 cm. Pulmonic Valve: The pulmonic valve was normal in  structure. Pulmonic valve regurgitation is not visualized. No evidence of pulmonic stenosis. Aorta: The aortic root and ascending aorta are structurally normal, with no evidence of dilitation. Venous: The inferior vena cava is normal in size with greater than 50% respiratory variability, suggesting right atrial pressure of 3 mmHg. IAS/Shunts: No atrial  level shunt detected by color flow Doppler.  LEFT VENTRICLE PLAX 2D LVIDd:         4.45 cm   Diastology LVIDs:         2.90 cm   LV e' medial:    11.20 cm/s LV PW:         1.00 cm   LV E/e' medial:  10.3 LV IVS:        1.00 cm   LV e' lateral:   12.00 cm/s LVOT diam:     2.50 cm   LV E/e' lateral: 9.6 LV SV:         83 LV SV Index:   34 LVOT Area:     4.91 cm  LEFT ATRIUM             Index LA Vol (A2C):   68.9 ml 28.57 ml/m LA Vol (A4C):   87.4 ml 36.24 ml/m LA Biplane Vol: 79.9 ml 33.13 ml/m  AORTIC VALVE AV Area (Vmax):    4.03 cm AV Area (Vmean):   3.72 cm AV Area (VTI):     3.75 cm AV Vmax:           113.00 cm/s AV Vmean:          79.000 cm/s AV VTI:            0.221 m AV Peak Grad:      5.1 mmHg AV Mean Grad:      3.0 mmHg LVOT Vmax:         92.70 cm/s LVOT Vmean:        59.800 cm/s LVOT VTI:          0.169 m LVOT/AV VTI ratio: 0.76  AORTA Ao Root diam: 3.60 cm Ao Asc diam:  3.60 cm MITRAL VALVE                TRICUSPID VALVE MV Area (PHT): 4.68 cm     TR Peak grad:   12.4 mmHg MV Decel Time: 162 msec     TR Vmax:        176.00 cm/s MV E velocity: 115.00 cm/s MV A velocity: 55.90 cm/s   SHUNTS MV E/A ratio:  2.06         Systemic VTI:  0.17 m                             Systemic Diam: 2.50 cm Clearnce Hasten Electronically signed by Clearnce Hasten Signature Date/Time: 09/04/2023/10:45:13 AM    Final    VAS US CAROTID Result Date: 09/04/2023 Carotid Arterial Duplex Study Patient Name:  Steve Jensen [facility]  Date of Exam:   09/04/2023 Medical Rec #: [id number]       Accession #:    [id number] Date of Birth: 1940/11/10      Patient Gender: M Patient Age:   74  years Exam Location:  [facility] Procedure:      VAS US CAROTID Referring Phys: Ulyess Blossom RATHORE --------------------------------------------------------------------------------  Indications:       Syncope. Risk Factors:      Hypertension, hyperlipidemia, Diabetes. Other Factors:     A-fib - on Eliquis. Comparison Study:  No priors. Performing Technologist: Steptoe Sink Sturdivant-Jones RDMS, RVT  Examination Guidelines: A complete evaluation includes B-mode imaging, spectral Doppler, color Doppler, and power Doppler as needed of all accessible portions of each vessel. Bilateral testing is considered an integral part of a complete examination. Limited examinations  for reoccurring indications may be performed as noted.  Right Carotid Findings: +----------+--------+--------+--------+------------------+--------+           PSV cm/sEDV cm/sStenosisPlaque DescriptionComments +----------+--------+--------+--------+------------------+--------+ CCA Prox  85      14                                         +----------+--------+--------+--------+------------------+--------+ CCA Distal75      14                                         +----------+--------+--------+--------+------------------+--------+ ICA Prox  73      19      1-39%   calcific                   +----------+--------+--------+--------+------------------+--------+ ICA Distal85      22                                         +----------+--------+--------+--------+------------------+--------+ ECA       112     15                                         +----------+--------+--------+--------+------------------+--------+ +----------+--------+-------+----------------+-------------------+           PSV cm/sEDV cmsDescribe        Arm Pressure (mmHG) +----------+--------+-------+----------------+-------------------+ Subclavian100            Multiphasic, WNL                     +----------+--------+-------+----------------+-------------------+ +---------+--------+--+--------+--+---------+ VertebralPSV cm/s52EDV cm/s16Antegrade +---------+--------+--+--------+--+---------+  Left Carotid Findings: +----------+--------+--------+--------+------------------+------------------+           PSV cm/sEDV cm/sStenosisPlaque DescriptionComments           +----------+--------+--------+--------+------------------+------------------+ CCA Prox  67      17                                                   +----------+--------+--------+--------+------------------+------------------+ CCA Distal126     22                                                   +----------+--------+--------+--------+------------------+------------------+ ICA Prox  66      17      1-39%                     intimal thickening +----------+--------+--------+--------+------------------+------------------+ ICA Distal79      28                                                   +----------+--------+--------+--------+------------------+------------------+ ECA       176     16                                                   +----------+--------+--------+--------+------------------+------------------+ +----------+--------+--------+----------------+-------------------+  PSV cm/sEDV cm/sDescribe        Arm Pressure (mmHG) +----------+--------+--------+----------------+-------------------+ [id number]              Multiphasic, WNL                    +----------+--------+--------+----------------+-------------------+ +---------+--------+--+--------+--+---------+ VertebralPSV cm/s38EDV cm/s12Antegrade +---------+--------+--+--------+--+---------+   Summary: Right Carotid: Velocities in the right ICA are consistent with a 1-39% stenosis. Left Carotid: Velocities in the left ICA are consistent with a 1-39% stenosis. Vertebrals:  Bilateral vertebral arteries demonstrate  antegrade flow. Subclavians: Normal flow hemodynamics were seen in bilateral subclavian              arteries. *See table(s) above for measurements and observations.     Preliminary    DG Chest Port 1 View Result Date: 09/03/2023 CLINICAL DATA:  Trauma EXAM: PORTABLE CHEST 1 VIEW COMPARISON:  Chest x-ray 11/13/2022 FINDINGS: The heart and mediastinal contours are unchanged. Atherosclerotic plaque. No focal consolidation. No pulmonary edema. No pleural effusion. No pneumothorax. No acute osseous abnormality. IMPRESSION: 1. No active disease. 2.  Aortic Atherosclerosis (ICD10-I70.0). Electronically Signed   By: Tish Frederickson M.D.   On: 09/03/2023 22:43   DG Pelvis Portable Result Date: 09/03/2023 CLINICAL DATA:  Trauma EXAM: PORTABLE PELVIS 1-2 VIEWS COMPARISON:  X-ray pelvis 12/23/2021 FINDINGS: Right femoral trochanter collimated off view. No acute displaced fracture or dislocation of the hips. There is no evidence of pelvic fracture or diastasis. No pelvic bone lesions are seen. Surgical clips overlie the pelvis. IMPRESSION: 1.  Negative for acute traumatic injury. 2. Right femoral trochanter collimated off view. Electronically Signed   By: Tish Frederickson M.D.   On: 09/03/2023 22:42   CT HEAD WO CONTRAST Result Date: 09/03/2023 CLINICAL DATA:  Pt fell today and hit the back of his head on the floor. Alert and oriented, but does not remember the fall. No obvious injury. Patient denies pain. EXAM: CT HEAD WITHOUT CONTRAST CT CERVICAL SPINE WITHOUT CONTRAST TECHNIQUE: Multidetector CT imaging of the head and cervical spine was performed following the standard protocol without intravenous contrast. Multiplanar CT image reconstructions of the cervical spine were also generated. RADIATION DOSE REDUCTION: This exam was performed according to the departmental dose-optimization program which includes automated exposure control, adjustment of the mA and/or kV according to patient size and/or use of iterative  reconstruction technique. COMPARISON:  CT head 11/11/2022 FINDINGS: CT HEAD FINDINGS Brain: Cerebral ventricle sizes are concordant with the degree of cerebral volume loss. Patchy and confluent areas of decreased attenuation are noted throughout the deep and periventricular white matter of the cerebral hemispheres bilaterally, compatible with chronic microvascular ischemic disease. No evidence of large-territorial acute infarction. No parenchymal hemorrhage. No mass lesion. No extra-axial collection. No mass effect or midline shift. No hydrocephalus. Basilar cisterns are patent. Vascular: No hyperdense vessel. Atherosclerotic calcifications are present within the cavernous internal carotid arteries. Skull: No acute fracture or focal lesion. Sinuses/Orbits: Paranasal sinuses and mastoid air cells are clear. Bilateral lens replacement. Otherwise the orbits are unremarkable. Other: Left nasolacrimal duct enlargement (3:10). CT CERVICAL SPINE FINDINGS Alignment: Normal. Skull base and vertebrae: Multilevel moderate to severe degenerative changes spine with associated at least moderate left C4-C5 osseous neural foraminal stenosis. No severe osseous central canal stenosis. + No acute fracture. No aggressive appearing focal osseous lesion or focal pathologic process. Soft tissues and spinal canal: No prevertebral fluid or swelling. No visible canal hematoma. Upper chest: Unremarkable. Other: Atherosclerotic plaque of the aortic arch and its main branches.  IMPRESSION: 1. No acute intracranial abnormality. 2. No acute displaced fracture or traumatic listhesis of the cervical spine. 3. Indeterminate left nasolacrimal duct enlargement. Correlate with clinical exam. 4.  Aortic Atherosclerosis (ICD10-I70.0). Electronically Signed   By: Tish Frederickson M.D.   On: 09/03/2023 22:40   CT CERVICAL SPINE WO CONTRAST Result Date: 09/03/2023 CLINICAL DATA:  Pt fell today and hit the back of his head on the floor. Alert and oriented,  but does not remember the fall. No obvious injury. Patient denies pain. EXAM: CT HEAD WITHOUT CONTRAST CT CERVICAL SPINE WITHOUT CONTRAST TECHNIQUE: Multidetector CT imaging of the head and cervical spine was performed following the standard protocol without intravenous contrast. Multiplanar CT image reconstructions of the cervical spine were also generated. RADIATION DOSE REDUCTION: This exam was performed according to the departmental dose-optimization program which includes automated exposure control, adjustment of the mA and/or kV according to patient size and/or use of iterative reconstruction technique. COMPARISON:  CT head 11/11/2022 FINDINGS: CT HEAD FINDINGS Brain: Cerebral ventricle sizes are concordant with the degree of cerebral volume loss. Patchy and confluent areas of decreased attenuation are noted throughout the deep and periventricular white matter of the cerebral hemispheres bilaterally, compatible with chronic microvascular ischemic disease. No evidence of large-territorial acute infarction. No parenchymal hemorrhage. No mass lesion. No extra-axial collection. No mass effect or midline shift. No hydrocephalus. Basilar cisterns are patent. Vascular: No hyperdense vessel. Atherosclerotic calcifications are present within the cavernous internal carotid arteries. Skull: No acute fracture or focal lesion. Sinuses/Orbits: Paranasal sinuses and mastoid air cells are clear. Bilateral lens replacement. Otherwise the orbits are unremarkable. Other: Left nasolacrimal duct enlargement (3:10). CT CERVICAL SPINE FINDINGS Alignment: Normal. Skull base and vertebrae: Multilevel moderate to severe degenerative changes spine with associated at least moderate left C4-C5 osseous neural foraminal stenosis. No severe osseous central canal stenosis. + No acute fracture. No aggressive appearing focal osseous lesion or focal pathologic process. Soft tissues and spinal canal: No prevertebral fluid or swelling. No visible  canal hematoma. Upper chest: Unremarkable. Other: Atherosclerotic plaque of the aortic arch and its main branches. IMPRESSION: 1. No acute intracranial abnormality. 2. No acute displaced fracture or traumatic listhesis of the cervical spine. 3. Indeterminate left nasolacrimal duct enlargement. Correlate with clinical exam. 4.  Aortic Atherosclerosis (ICD10-I70.0). Electronically Signed   By: Tish Frederickson M.D.   On: 09/03/2023 22:40           LOS: 0 days   Time spent= 35 mins    Miguel Rota, MD [facility]  If 7PM-7AM, please contact night-coverage  09/04/2023, 12:18 PM

## 2023-09-04 NOTE — Procedures (Signed)
 Patient Name: KAMAURY CUTBIRTH  MRN: [id number]  Epilepsy Attending: Charlsie Quest  Referring Physician/Provider: John Giovanni, MD  Date: 09/04/2023 Duration: 24.26 mins  Patient history: 83yo M with syncope. EEG to evaluate for seizure  Level of alertness: Awake  AEDs during EEG study: PGB  Technical aspects: This EEG study was done with scalp electrodes positioned according to the 10-20 International system of electrode placement. Electrical activity was reviewed with band pass filter of 1-70Hz , sensitivity of 7 uV/mm, display speed of 41mm/sec with a 60Hz  notched filter applied as appropriate. EEG data were recorded continuously and digitally stored.  Video monitoring was available and reviewed as appropriate.  Description: The posterior dominant rhythm consists of 8-9 Hz activity of moderate voltage (25-35 uV) seen predominantly in posterior head regions, symmetric and reactive to eye opening and eye closing. EEG showed intermittent generalized 3 to 6 Hz theta-delta slowing. Physiologic photic driving was not seen during photic stimulation.  Hyperventilation was not performed.     ABNORMALITY - Intermittent slow, generalized  IMPRESSION: This study is suggestive of mild diffuse encephalopathy. No seizures or epileptiform discharges were seen throughout the recording.   Dalene Robards Annabelle Harman

## 2023-09-04 NOTE — ED Notes (Signed)
 Help get patient straighten up in the bed patient has call bell in reach

## 2023-09-04 NOTE — Progress Notes (Signed)
 EEG complete - results pending

## 2023-09-05 DIAGNOSIS — N179 Acute kidney failure, unspecified: Secondary | ICD-10-CM | POA: Diagnosis not present

## 2023-09-05 DIAGNOSIS — R55 Syncope and collapse: Secondary | ICD-10-CM | POA: Diagnosis not present

## 2023-09-05 DIAGNOSIS — I4811 Longstanding persistent atrial fibrillation: Secondary | ICD-10-CM | POA: Diagnosis not present

## 2023-09-05 DIAGNOSIS — I5032 Chronic diastolic (congestive) heart failure: Secondary | ICD-10-CM | POA: Diagnosis not present

## 2023-09-05 LAB — MAGNESIUM: Magnesium: 1.8 mg/dL (ref 1.7–2.4)

## 2023-09-05 LAB — BASIC METABOLIC PANEL WITH GFR
Anion gap: 12 (ref 5–15)
BUN: 21 mg/dL (ref 8–23)
CO2: 22 mmol/L (ref 22–32)
Calcium: 9.1 mg/dL (ref 8.9–10.3)
Chloride: 102 mmol/L (ref 98–111)
Creatinine, Ser: 1.31 mg/dL — ABNORMAL HIGH (ref 0.61–1.24)
GFR, Estimated: 55 mL/min — ABNORMAL LOW (ref >60)
Glucose, Bld: 145 mg/dL — ABNORMAL HIGH (ref 70–99)
Potassium: 4.1 mmol/L (ref 3.5–5.1)
Sodium: 136 mmol/L (ref 135–145)

## 2023-09-05 LAB — CBC
HCT: 41.7 % (ref 39.0–52.0)
Hemoglobin: 13.6 g/dL (ref 13.0–17.0)
MCH: 26.4 pg (ref 26.0–34.0)
MCHC: 32.6 g/dL (ref 30.0–36.0)
MCV: 81 fL (ref 80.0–100.0)
Platelets: 462 10*3/uL — ABNORMAL HIGH (ref 150–400)
RBC: 5.15 MIL/uL (ref 4.22–5.81)
RDW: 17.8 % — ABNORMAL HIGH (ref 11.5–15.5)
WBC: 16.9 10*3/uL — ABNORMAL HIGH (ref 4.0–10.5)
nRBC: 0 % (ref 0.0–0.2)

## 2023-09-05 LAB — GLUCOSE, CAPILLARY
Glucose-Capillary: 163 mg/dL — ABNORMAL HIGH (ref 70–99)
Glucose-Capillary: 156 mg/dL — ABNORMAL HIGH (ref 70–99)
Glucose-Capillary: 300 mg/dL — ABNORMAL HIGH (ref 70–99)
Glucose-Capillary: 142 mg/dL — ABNORMAL HIGH (ref 70–99)

## 2023-09-05 MED ORDER — FUROSEMIDE 20 MG PO TABS: 20.0000 mg | ORAL_TABLET | Freq: Every day | ORAL | 1 refills | Status: DC

## 2023-09-05 MED ORDER — POTASSIUM CHLORIDE CRYS ER 10 MEQ PO TBCR: 10.0000 meq | EXTENDED_RELEASE_TABLET | Freq: Every day | ORAL | 1 refills | Status: DC

## 2023-09-05 MED ORDER — DOXYCYCLINE HYCLATE 100 MG PO TABS: 100.0000 mg | ORAL_TABLET | Freq: Two times a day (BID) | ORAL | 0 refills | Status: DC

## 2023-09-05 NOTE — Progress Notes (Signed)
 Progress Note  Patient Name: Steve Jensen Date of Encounter: 09/05/2023 Primary Cardiologist: Olinda Bertrand, DO   Subjective   Mr. Bukhari is a 83 year old with syncope and persistent atrial fibrillation who presents with concerns of syncope.    He feels good currently but has not fully regained his energy. He has been evaluated for non-cardiac syncope, and orthostatic vital signs are pending. No CP, SOB.    He lives at home and is primarily cared for by his daughter-in-law and granddaughter-in-law. He does not perform much ambulation and relies on assistance for daily activities.  Vital Signs    Vitals:   09/04/23 1431 09/04/23 1700 09/04/23 2031 09/05/23 0007  BP: 122/74 136/85  123/73  Pulse: 79 80 96 92  Resp: 16 16 18 18   Temp: 97.6 F (36.4 C) (P) 97.6 F (36.4 C) 98.3 F (36.8 C) 98.7 F (37.1 C)  TempSrc: Oral (P) Oral Oral Axillary  SpO2:  93% 95% 100%  Weight:  128.3 kg    Height:  5\' 11"  (1.803 m)      Intake/Output Summary (Last 24 hours) at 09/05/2023 1159 Last data filed at 09/05/2023 0000 Gross per 24 hour  Intake --  Output 1600 ml  Net -1600 ml   Filed Weights   09/03/23 2046 09/04/23 1700  Weight: 127.5 kg 128.3 kg    Physical Exam   GEN: No acute distress.  Morbid obesity Neck: No JVD Cardiac: IRRR distant heart sounds Respiratory: Clear to auscultation bilaterally. GI: Soft, nontender, non-distended  MS: No edema  Labs   Telemetry: AF rate controled with no significant pauses   Chemistry Recent Labs  Lab 09/03/23 2103 09/04/23 0516 09/05/23 0612  NA 135 137 136  K 6.3* 5.1 4.1  CL 99 103 102  CO2 23 21* 22  GLUCOSE 184* 123* 145*  BUN 39* 35* 21  CREATININE 2.12* 1.77* 1.31*  CALCIUM 9.5 9.3 9.1  PROT 7.0  --   --   ALBUMIN 3.1*  --   --   AST 31  --   --   ALT 20  --   --   ALKPHOS 91  --   --   BILITOT 0.4  --   --   GFRNONAA 31* 38* 55*  ANIONGAP 13 13 12      Hematology Recent Labs  Lab 09/03/23 2103  09/04/23 0516  WBC 19.5* 18.6*  RBC 5.40 4.99  HGB 14.3 13.2  HCT 44.5 43.5  MCV 82.4 87.2  MCH 26.5 26.5  MCHC 32.1 30.3  RDW 17.7* 17.6*  PLT 509* 442*    Cardiac EnzymesNo results for input(s): "TROPONINI" in the last 168 hours. No results for input(s): "TROPIPOC" in the last 168 hours.   BNP Recent Labs  Lab 09/03/23 2103  BNP 152.3*     DDimer No results for input(s): "DDIMER" in the last 168 hours.   Cardiac Studies   Cardiac Studies & Procedures   ______________________________________________________________________________________________   STRESS TESTS  NM MYOCAR MULTI W/SPECT W 09/24/2010   ECHOCARDIOGRAM  ECHOCARDIOGRAM COMPLETE 09/04/2023  Narrative ECHOCARDIOGRAM REPORT    Patient Name:   JONATHEN RATHMAN Date of Exam: 09/04/2023 Medical Rec #:  [id number]      Height:       70.0 in Accession #:    [id number]     Weight:       281.0 lb Date of Birth:  [birth year]     BSA:  2.412 m Patient Age:    82 years       BP:           107/66 mmHg Patient Gender: M              HR:           95 bpm. Exam Location:  Inpatient  Procedure: 2D Echo, Cardiac Doppler, Color Doppler and Intracardiac Opacification Agent (Both Spectral and Color Flow Doppler were utilized during procedure).  Indications:    Syncope  History:        Patient has prior history of Echocardiogram examinations, most recent 11/12/2022. Arrythmias:Atrial Fibrillation; Risk Factors:Sleep Apnea, Dyslipidemia, Diabetes and Hypertension.  Sonographer:    Sulema Endo RDCS, FE, PE Referring Phys: [id number] VASUNDHRA RATHORE  IMPRESSIONS   1. Left ventricular ejection fraction, by estimation, is 55 to 60%. The left ventricle has normal function. The left ventricle has no regional wall motion abnormalities. Left ventricular diastolic function could not be evaluated. 2. Right ventricular systolic function is mildly reduced. The right ventricular size is mildly enlarged. Tricuspid  regurgitation signal is inadequate for assessing PA pressure. 3. Left atrial size was mildly dilated. 4. The mitral valve is normal in structure. Trivial mitral valve regurgitation. No evidence of mitral stenosis. 5. The aortic valve is calcified. There is moderate calcification of the aortic valve. There is mild thickening of the aortic valve. Aortic valve regurgitation is mild. Aortic valve sclerosis/calcification is present, without any evidence of aortic stenosis. 6. The inferior vena cava is normal in size with greater than 50% respiratory variability, suggesting right atrial pressure of 3 mmHg.  FINDINGS Left Ventricle: Left ventricular ejection fraction, by estimation, is 55 to 60%. The left ventricle has normal function. The left ventricle has no regional wall motion abnormalities. Definity contrast agent was given IV to delineate the left ventricular endocardial borders. The left ventricular internal cavity size was normal in size. There is no left ventricular hypertrophy. Left ventricular diastolic function could not be evaluated due to atrial fibrillation. Left ventricular diastolic function could not be evaluated.  Right Ventricle: Poorly visualized but does appear to have at least mildly reduced function. The right ventricular size is mildly enlarged. No increase in right ventricular wall thickness. Right ventricular systolic function is mildly reduced. Tricuspid regurgitation signal is inadequate for assessing PA pressure.  Left Atrium: Left atrial size was mildly dilated.  Right Atrium: Right atrial size was normal in size.  Pericardium: There is no evidence of pericardial effusion.  Mitral Valve: The mitral valve is normal in structure. Trivial mitral valve regurgitation. No evidence of mitral valve stenosis.  Tricuspid Valve: The tricuspid valve is normal in structure. Tricuspid valve regurgitation is mild . No evidence of tricuspid stenosis.  Aortic Valve: The aortic valve  is calcified. There is moderate calcification of the aortic valve. There is mild thickening of the aortic valve. Aortic valve regurgitation is mild. Aortic valve sclerosis/calcification is present, without any evidence of aortic stenosis. Aortic valve mean gradient measures 3.0 mmHg. Aortic valve peak gradient measures 5.1 mmHg. Aortic valve area, by VTI measures 3.75 cm.  Pulmonic Valve: The pulmonic valve was normal in structure. Pulmonic valve regurgitation is not visualized. No evidence of pulmonic stenosis.  Aorta: The aortic root and ascending aorta are structurally normal, with no evidence of dilitation.  Venous: The inferior vena cava is normal in size with greater than 50% respiratory variability, suggesting right atrial pressure of 3 mmHg.  IAS/Shunts: No atrial level shunt  detected by color flow Doppler.   LEFT VENTRICLE PLAX 2D LVIDd:         4.45 cm   Diastology LVIDs:         2.90 cm   LV e' medial:    11.20 cm/s LV PW:         1.00 cm   LV E/e' medial:  10.3 LV IVS:        1.00 cm   LV e' lateral:   12.00 cm/s LVOT diam:     2.50 cm   LV E/e' lateral: 9.6 LV SV:         83 LV SV Index:   34 LVOT Area:     4.91 cm   LEFT ATRIUM             Index LA Vol (A2C):   68.9 ml 28.57 ml/m LA Vol (A4C):   87.4 ml 36.24 ml/m LA Biplane Vol: 79.9 ml 33.13 ml/m AORTIC VALVE AV Area (Vmax):    4.03 cm AV Area (Vmean):   3.72 cm AV Area (VTI):     3.75 cm AV Vmax:           113.00 cm/s AV Vmean:          79.000 cm/s AV VTI:            0.221 m AV Peak Grad:      5.1 mmHg AV Mean Grad:      3.0 mmHg LVOT Vmax:         92.70 cm/s LVOT Vmean:        59.800 cm/s LVOT VTI:          0.169 m LVOT/AV VTI ratio: 0.76  AORTA Ao Root diam: 3.60 cm Ao Asc diam:  3.60 cm  MITRAL VALVE                TRICUSPID VALVE MV Area (PHT): 4.68 cm     TR Peak grad:   12.4 mmHg MV Decel Time: 162 msec     TR Vmax:        176.00 cm/s MV E velocity: 115.00 cm/s MV A velocity: 55.90  cm/s   SHUNTS MV E/A ratio:  2.06         Systemic VTI:  0.17 m Systemic Diam: 2.50 cm  Arta Lark Electronically signed by Arta Lark Signature Date/Time: 09/04/2023/10:45:13 AM    Final    MONITORS  LONG TERM MONITOR (3-14 DAYS) 03/28/2021  Narrative  Normal Sinus rhtyhm with rare PACs and PVCs.  Rare runs of PACs not associated with symptoms.  No atrial fibrillation.  No sustained arrhythmias.   Patch Wear Time:  13 days and 20 hours (2022-10-15T16:41:00-0400 to 2022-10-29T13:04:40-0400)  Patient had a min HR of 48 bpm, Caton HR of 171 bpm, and avg HR of 81 bpm. Predominant underlying rhythm was Sinus Rhythm. 46 Supraventricular Tachycardia runs occurred, the run with the fastest interval lasting 7 beats with a Jonh rate of 171 bpm, the longest lasting 16.3 secs with an avg rate of 124 bpm. Isolated SVEs were occasional (1.6%, 26208), SVE Couplets were rare (<1.0%, 1064), and SVE Triplets were rare (<1.0%, 125). Isolated VEs were rare (<1.0%, 10787), VE Couplets were rare (<1.0%, 98), and VE Triplets were rare (<1.0%, 4).       ______________________________________________________________________________________________       Assessment & Plan   Persistent Atrial Fibrillation He has long-standing persistent atrial fibrillation, currently in atrial fibrillation with no significant pauses  over the last 24 hours. The longest pause recorded was 1.5 seconds. Medication adjustments aim to maintain sinus rhythm and prevent symptomatic bradycardia. - Ton metoprolol 75 mg PO Daily - his MRA and diltiazem have been stopped with decreased lethargy and improving creatinine - outpatient non live monitor will be sent; he is not excited about this but amenable  Syncope He experiences episodes of syncope, some mechanical related to his walker, others non-cardiac. No cardiac causes identified. Orthostatic vitals are under evaluation. His care is complicated by morbid  obesity. The goal is to rule out all potential causes and ensure a safe discharge plan to prevent future falls. - Evaluate orthostatic hypotension - Conduct functional assessment with occupational therapy - Ensure safe discharge plan to prevent future falls  Heart Failure He shows no clinical signs of acute or chronic heart failure. Echocardiogram indicates normal function with mildly reduced stroke volume index but normal DV (mild AS) - at discharge, lasix maintenance dose 20 mg PO daily   Chronic Kidney Disease (CKD) and Acute Kidney Injury (AKI) He has CKD stage 3A and recent AKI, improving with gentle hydration. Creatinine levels are nearing normal. Lasix reduction aims to prevent further renal impairment while maintaining fluid balance. - Continue gentle hydration as per TRH  Discussed with Dr. Lyndon Santiago, planned for DC today; we are working on logistics of safe cardiac follow up  For questions or updates, please contact [facility] Please consult [email or web address] for contact info under Cardiology/STEMI.      Gloriann Larger, MD FASE [facility] Cardiologist [facility]  [address] [place], [address] [place], [place] [address] [phone]  11:59 AM

## 2023-09-05 NOTE — Discharge Summary (Addendum)
 [facility]  Physician Discharge Summary   Patient ID: MONTAY VANVOORHIS MRN: [id number] DOB/AGE: 1940-10-31 83 y.o.  Admit date: 09/03/2023 Discharge date:   09/05/2023   PCP: Glena Landau, MD  DISCHARGE DIAGNOSES:  Near Syncope   Type 2 diabetes mellitus (HCC)   OSA (obstructive sleep apnea)   Atrial fibrillation (HCC)   Cellulitis   AKI (acute kidney injury) (HCC)   Hyperkalemia   Long term (current) use of anticoagulants   Chronic heart failure with preserved ejection fraction (HFpEF) (HCC)   RECOMMENDATIONS FOR OUTPATIENT FOLLOW UP: Cardiology to arrange outpatient follow-up Follow-up with PCP within 1 week for blood work to check CBC and basic metabolic panel   Home Health: PT Equipment/Devices: None  CODE STATUS: DNR  DISCHARGE CONDITION: fair  Diet recommendation: Heart healthy carb modified  INITIAL HISTORY: 83 year old with history of A-fib on Eliquis, HTN, chronic CHF with preserved EF, OSA on CPAP, DM2, obesity, CKD 3A, hepatocellular carcinoma, GERD, prostate cancer comes to the ED after a fall and struck the back of the head. Upon admission trauma workup including CT head and cervical spine were negative. Was found to have AKI and hyperkalemia. Patient was given Lokelma, IV calcium gluconate, insulin and D50, nebulizer.   Consultations: Cardiology  Procedures: Echocardiogram EEG Carotid Doppler   HOSPITAL COURSE:   Fall at home with concern for near syncope Patient not entirely certain if he actually passed out or not. Patient was seen by cardiology. Underwent echocardiogram which showed LVEF of 55 to 60%.  No significant valvular abnormalities noted.  Carotid Doppler unremarkable.  EEG was unremarkable.   Orthostatics did not show any significant changes blood pressure or heart rate.   TSH noted to be normal.  Cortisol level is borderline low.  However clinically low suspicion for adrenal insufficiency. Waiting on PT to ambulate  patient. Cardiology to arrange heart monitor.   A-fib, chronic To be discharged only on metoprolol.  Cardizem has been discontinued.  Cardiology to arrange outpatient monitor.     Right knee skin abrasion with possible cellulitis Secondary to mechanical fall a week ago.  Was started on doxycycline which will be continued at discharge.  WBC is noted to be better.  AKI on CKD stage IIIa Prerenal in nature.  Baseline creatinine 1.2, admission 2.12, improved with IV fluids.   Hyperkalemia Received treatment in the ER, improved. Spironolactone has been discontinued.   Insulin-dependent type 2 diabetes A1c 7.5.  Continue home medications   Chronic HFpEF Appears to be euvolemic.  Lasix has been decreased.  Spironolactone discontinued.  Echocardiogram shows normal LVEF.   Hypertension   OSA Continue nightly CPAP.   History of hepatocellular carcinoma - Underwent micro ablation and transcatheter embolization about 5 years ago.  Currently under yearly surveillance     Obesity Estimated body mass index is 39.45 kg/m as calculated from the following:   Height as of this encounter: 5\' 11"  (1.803 m).   Weight as of this encounter: 128.3 kg.  Patient is stable.  No further workup is necessary.  He can go home once he has been ambulated by physical therapy.  Informed by PT and OT that patient requiring higher level of care that cannot be safely provided at home. Will involve TOC to assist with SNF for rehab. Will not discharge patient today.   PERTINENT LABS:  The results of significant diagnostics from this hospitalization (including imaging, microbiology, ancillary and laboratory) are listed below for reference.    Microbiology: Recent Results (from  the past 240 hours)  Resp panel by RT-PCR (RSV, Flu A&B, Covid) Anterior Nasal Swab     Status: None   Collection Time: 09/04/23  1:35 PM   Specimen: Anterior Nasal Swab  Result Value Ref Range Status   SARS Coronavirus 2 by RT PCR  NEGATIVE NEGATIVE Final   Influenza A by PCR NEGATIVE NEGATIVE Final   Influenza B by PCR NEGATIVE NEGATIVE Final    Comment: (NOTE) The Xpert Xpress SARS-CoV-2/FLU/RSV plus assay is intended as an aid in the diagnosis of influenza from Nasopharyngeal swab specimens and should not be used as a sole basis for treatment. Nasal washings and aspirates are unacceptable for Xpert Xpress SARS-CoV-2/FLU/RSV testing.  Fact Sheet for Patients: BloggerCourse.com  Fact Sheet for Healthcare Providers: SeriousBroker.it  This test is not yet approved or cleared by the [place]  FDA and has been authorized for detection and/or diagnosis of SARS-CoV-2 by FDA under an Emergency Use Authorization (EUA). This EUA will remain in effect (meaning this test can be used) for the duration of the COVID-19 declaration under Section 564(b)(1) of the Act, 21 U.S.C. section 360bbb-3(b)(1), unless the authorization is terminated or revoked.     Resp Syncytial Virus by PCR NEGATIVE NEGATIVE Final    Comment: (NOTE) Fact Sheet for Patients: BloggerCourse.com  Fact Sheet for Healthcare Providers: SeriousBroker.it  This test is not yet approved or cleared by the [place]  FDA and has been authorized for detection and/or diagnosis of SARS-CoV-2 by FDA under an Emergency Use Authorization (EUA). This EUA will remain in effect (meaning this test can be used) for the duration of the COVID-19 declaration under Section 564(b)(1) of the Act, 21 U.S.C. section 360bbb-3(b)(1), unless the authorization is terminated or revoked.  Performed at [facility], [address], [place], [place] [address]   Respiratory (~20 pathogens) panel by PCR     Status: None   Collection Time: 09/04/23  1:35 PM   Specimen: Anterior Nasal Swab; Respiratory  Result Value Ref Range Status   Adenovirus NOT DETECTED NOT  DETECTED Final   Coronavirus 229E NOT DETECTED NOT DETECTED Final    Comment: (NOTE) The Coronavirus on the Respiratory Panel, DOES NOT test for the novel  Coronavirus (2019 nCoV)    Coronavirus HKU1 NOT DETECTED NOT DETECTED Final   Coronavirus NL63 NOT DETECTED NOT DETECTED Final   Coronavirus OC43 NOT DETECTED NOT DETECTED Final   Metapneumovirus NOT DETECTED NOT DETECTED Final   Rhinovirus / Enterovirus NOT DETECTED NOT DETECTED Final   Influenza A NOT DETECTED NOT DETECTED Final   Influenza B NOT DETECTED NOT DETECTED Final   Parainfluenza Virus 1 NOT DETECTED NOT DETECTED Final   Parainfluenza Virus 2 NOT DETECTED NOT DETECTED Final   Parainfluenza Virus 3 NOT DETECTED NOT DETECTED Final   Parainfluenza Virus 4 NOT DETECTED NOT DETECTED Final   Respiratory Syncytial Virus NOT DETECTED NOT DETECTED Final   Bordetella pertussis NOT DETECTED NOT DETECTED Final   Bordetella Parapertussis NOT DETECTED NOT DETECTED Final   Chlamydophila pneumoniae NOT DETECTED NOT DETECTED Final   Mycoplasma pneumoniae NOT DETECTED NOT DETECTED Final    Comment: Performed at [facility], [address] [place], [place] [address]     Labs:   Basic Metabolic Panel: Recent Labs  Lab 09/03/23 2103 09/04/23 0516 09/05/23 0612  NA 135 137 136  K 6.3* 5.1 4.1  CL 99 103 102  CO2 23 21* 22  GLUCOSE 184* 123* 145*  BUN 39*  35* 21  CREATININE 2.12* 1.77* 1.31*  CALCIUM 9.5 9.3 9.1  MG  --   --  1.8   Liver Function Tests: Recent Labs  Lab 09/03/23 2103  AST 31  ALT 20  ALKPHOS 91  BILITOT 0.4  PROT 7.0  ALBUMIN 3.1*    CBC: Recent Labs  Lab 09/03/23 2103 09/04/23 0516 09/05/23 1131  WBC 19.5* 18.6* 16.9*  HGB 14.3 13.2 13.6  HCT 44.5 43.5 41.7  MCV 82.4 87.2 81.0  PLT 509* 442* 462*   BNP: BNP (last 3 results) Recent Labs    11/11/22 1210 09/03/23 2103  BNP 457.0* 152.3*    CBG: Recent Labs  Lab 09/04/23 1224 09/04/23 1658 09/04/23 2115 09/05/23 0732  09/05/23 1208  GLUCAP 142* 119* 215* 163* 156*     IMAGING STUDIES VAS US  CAROTID Result Date: 09/04/2023 Carotid Arterial Duplex Study Patient Name:  NAVJOT LOERA [facility]  Date of Exam:   09/04/2023 Medical Rec #: [id number]       Accession #:    [id number] Date of Birth: 03-Oct-1940      Patient Gender: M Patient Age:   10 years Exam Location:  [facility] Procedure:      VAS US  CAROTID Referring Phys: Corky Diener RATHORE --------------------------------------------------------------------------------  Indications:       Syncope. Risk Factors:      Hypertension, hyperlipidemia, Diabetes. Other Factors:     A-fib - on Eliquis. Comparison Study:  No priors. Performing Technologist: Franky Ivanoff Sturdivant-Jones RDMS, RVT  Examination Guidelines: A complete evaluation includes B-mode imaging, spectral Doppler, color Doppler, and power Doppler as needed of all accessible portions of each vessel. Bilateral testing is considered an integral part of a complete examination. Limited examinations for reoccurring indications may be performed as noted.  Right Carotid Findings: +----------+--------+--------+--------+------------------+--------+           PSV cm/sEDV cm/sStenosisPlaque DescriptionComments +----------+--------+--------+--------+------------------+--------+ CCA Prox  85      14                                         +----------+--------+--------+--------+------------------+--------+ CCA Distal75      14                                         +----------+--------+--------+--------+------------------+--------+ ICA Prox  73      19      1-39%   calcific                   +----------+--------+--------+--------+------------------+--------+ ICA Distal85      22                                         +----------+--------+--------+--------+------------------+--------+ ECA       112     15                                          +----------+--------+--------+--------+------------------+--------+ +----------+--------+-------+----------------+-------------------+           PSV cm/sEDV cmsDescribe        Arm Pressure (mmHG) +----------+--------+-------+----------------+-------------------+ Subclavian100  Multiphasic, WNL                    +----------+--------+-------+----------------+-------------------+ +---------+--------+--+--------+--+---------+ VertebralPSV cm/s52EDV cm/s16Antegrade +---------+--------+--+--------+--+---------+  Left Carotid Findings: +----------+--------+--------+--------+------------------+------------------+           PSV cm/sEDV cm/sStenosisPlaque DescriptionComments           +----------+--------+--------+--------+------------------+------------------+ CCA Prox  67      17                                                   +----------+--------+--------+--------+------------------+------------------+ CCA Distal126     22                                                   +----------+--------+--------+--------+------------------+------------------+ ICA Prox  66      17      1-39%                     intimal thickening +----------+--------+--------+--------+------------------+------------------+ ICA Distal79      28                                                   +----------+--------+--------+--------+------------------+------------------+ ECA       176     16                                                   +----------+--------+--------+--------+------------------+------------------+ +----------+--------+--------+----------------+-------------------+           PSV cm/sEDV cm/sDescribe        Arm Pressure (mmHG) +----------+--------+--------+----------------+-------------------+ [id number]              Multiphasic, WNL                    +----------+--------+--------+----------------+-------------------+  +---------+--------+--+--------+--+---------+ VertebralPSV cm/s38EDV cm/s12Antegrade +---------+--------+--+--------+--+---------+   Summary: Right Carotid: Velocities in the right ICA are consistent with a 1-39% stenosis. Left Carotid: Velocities in the left ICA are consistent with a 1-39% stenosis. Vertebrals:  Bilateral vertebral arteries demonstrate antegrade flow. Subclavians: Normal flow hemodynamics were seen in bilateral subclavian              arteries. *See table(s) above for measurements and observations.  Electronically signed by Gerarda Fraction on 09/04/2023 at 9:02:31 PM.    Final    EEG adult Result Date: 09/04/2023 Charlsie Quest, MD     09/04/2023  5:54 PM Patient Name: ALYAN HARTLINE MRN: [id number] Epilepsy Attending: Charlsie Quest Referring Physician/Provider: John Giovanni, MD Date: 09/04/2023 Duration: 24.26 mins Patient history: 84yo M with syncope. EEG to evaluate for seizure Level of alertness: Awake AEDs during EEG study: PGB Technical aspects: This EEG study was done with scalp electrodes positioned according to the 10-20 International system of electrode placement. Electrical activity was reviewed with band pass filter of 1-70Hz , sensitivity of 7 uV/mm, display speed of 43mm/sec with a 60Hz  notched filter applied as appropriate. EEG data were recorded continuously  and digitally stored.  Video monitoring was available and reviewed as appropriate. Description: The posterior dominant rhythm consists of 8-9 Hz activity of moderate voltage (25-35 uV) seen predominantly in posterior head regions, symmetric and reactive to eye opening and eye closing. EEG showed intermittent generalized 3 to 6 Hz theta-delta slowing. Physiologic photic driving was not seen during photic stimulation.  Hyperventilation was not performed.   ABNORMALITY - Intermittent slow, generalized IMPRESSION: This study is suggestive of mild diffuse encephalopathy. No seizures or epileptiform discharges were seen  throughout the recording. Arleene Lack   ECHOCARDIOGRAM COMPLETE Result Date: 09/04/2023    ECHOCARDIOGRAM REPORT   Patient Name:   JEFFRY VOGELSANG Date of Exam: 09/04/2023 Medical Rec #:  [id number]      Height:       70.0 in Accession #:    [id number]     Weight:       281.0 lb Date of Birth:  05/23/1941     BSA:          2.412 m Patient Age:    82 years       BP:           107/66 mmHg Patient Gender: M              HR:           95 bpm. Exam Location:  Inpatient Procedure: 2D Echo, Cardiac Doppler, Color Doppler and Intracardiac            Opacification Agent (Both Spectral and Color Flow Doppler were            utilized during procedure). Indications:    Syncope  History:        Patient has prior history of Echocardiogram examinations, most                 recent 11/12/2022. Arrythmias:Atrial Fibrillation; Risk                 Factors:Sleep Apnea, Dyslipidemia, Diabetes and Hypertension.  Sonographer:    Sulema Endo RDCS, FE, PE Referring Phys: [id number] VASUNDHRA RATHORE IMPRESSIONS  1. Left ventricular ejection fraction, by estimation, is 55 to 60%. The left ventricle has normal function. The left ventricle has no regional wall motion abnormalities. Left ventricular diastolic function could not be evaluated.  2. Right ventricular systolic function is mildly reduced. The right ventricular size is mildly enlarged. Tricuspid regurgitation signal is inadequate for assessing PA pressure.  3. Left atrial size was mildly dilated.  4. The mitral valve is normal in structure. Trivial mitral valve regurgitation. No evidence of mitral stenosis.  5. The aortic valve is calcified. There is moderate calcification of the aortic valve. There is mild thickening of the aortic valve. Aortic valve regurgitation is mild. Aortic valve sclerosis/calcification is present, without any evidence of aortic stenosis.  6. The inferior vena cava is normal in size with greater than 50% respiratory variability, suggesting right atrial  pressure of 3 mmHg. FINDINGS  Left Ventricle: Left ventricular ejection fraction, by estimation, is 55 to 60%. The left ventricle has normal function. The left ventricle has no regional wall motion abnormalities. Definity contrast agent was given IV to delineate the left ventricular  endocardial borders. The left ventricular internal cavity size was normal in size. There is no left ventricular hypertrophy. Left ventricular diastolic function could not be evaluated due to atrial fibrillation. Left ventricular diastolic function could  not be evaluated. Right Ventricle: Poorly visualized but does appear to  have at least mildly reduced function. The right ventricular size is mildly enlarged. No increase in right ventricular wall thickness. Right ventricular systolic function is mildly reduced. Tricuspid regurgitation signal is inadequate for assessing PA pressure. Left Atrium: Left atrial size was mildly dilated. Right Atrium: Right atrial size was normal in size. Pericardium: There is no evidence of pericardial effusion. Mitral Valve: The mitral valve is normal in structure. Trivial mitral valve regurgitation. No evidence of mitral valve stenosis. Tricuspid Valve: The tricuspid valve is normal in structure. Tricuspid valve regurgitation is mild . No evidence of tricuspid stenosis. Aortic Valve: The aortic valve is calcified. There is moderate calcification of the aortic valve. There is mild thickening of the aortic valve. Aortic valve regurgitation is mild. Aortic valve sclerosis/calcification is present, without any evidence of aortic stenosis. Aortic valve mean gradient measures 3.0 mmHg. Aortic valve peak gradient measures 5.1 mmHg. Aortic valve area, by VTI measures 3.75 cm. Pulmonic Valve: The pulmonic valve was normal in structure. Pulmonic valve regurgitation is not visualized. No evidence of pulmonic stenosis. Aorta: The aortic root and ascending aorta are structurally normal, with no evidence of dilitation.  Venous: The inferior vena cava is normal in size with greater than 50% respiratory variability, suggesting right atrial pressure of 3 mmHg. IAS/Shunts: No atrial level shunt detected by color flow Doppler.  LEFT VENTRICLE PLAX 2D LVIDd:         4.45 cm   Diastology LVIDs:         2.90 cm   LV e' medial:    11.20 cm/s LV PW:         1.00 cm   LV E/e' medial:  10.3 LV IVS:        1.00 cm   LV e' lateral:   12.00 cm/s LVOT diam:     2.50 cm   LV E/e' lateral: 9.6 LV SV:         83 LV SV Index:   34 LVOT Area:     4.91 cm  LEFT ATRIUM             Index LA Vol (A2C):   68.9 ml 28.57 ml/m LA Vol (A4C):   87.4 ml 36.24 ml/m LA Biplane Vol: 79.9 ml 33.13 ml/m  AORTIC VALVE AV Area (Vmax):    4.03 cm AV Area (Vmean):   3.72 cm AV Area (VTI):     3.75 cm AV Vmax:           113.00 cm/s AV Vmean:          79.000 cm/s AV VTI:            0.221 m AV Peak Grad:      5.1 mmHg AV Mean Grad:      3.0 mmHg LVOT Vmax:         92.70 cm/s LVOT Vmean:        59.800 cm/s LVOT VTI:          0.169 m LVOT/AV VTI ratio: 0.76  AORTA Ao Root diam: 3.60 cm Ao Asc diam:  3.60 cm MITRAL VALVE                TRICUSPID VALVE MV Area (PHT): 4.68 cm     TR Peak grad:   12.4 mmHg MV Decel Time: 162 msec     TR Vmax:        176.00 cm/s MV E velocity: 115.00 cm/s MV A velocity: 55.90 cm/s   SHUNTS MV E/A  ratio:  2.06         Systemic VTI:  0.17 m                             Systemic Diam: 2.50 cm Arta Lark Electronically signed by Arta Lark Signature Date/Time: 09/04/2023/10:45:13 AM    Final    DG Chest Port 1 View Result Date: 09/03/2023 CLINICAL DATA:  Trauma EXAM: PORTABLE CHEST 1 VIEW COMPARISON:  Chest x-ray 11/13/2022 FINDINGS: The heart and mediastinal contours are unchanged. Atherosclerotic plaque. No focal consolidation. No pulmonary edema. No pleural effusion. No pneumothorax. No acute osseous abnormality. IMPRESSION: 1. No active disease. 2.  Aortic Atherosclerosis (ICD10-I70.0). Electronically Signed   By: Morgane  Naveau  M.D.   On: 09/03/2023 22:43   DG Pelvis Portable Result Date: 09/03/2023 CLINICAL DATA:  Trauma EXAM: PORTABLE PELVIS 1-2 VIEWS COMPARISON:  X-ray pelvis 12/23/2021 FINDINGS: Right femoral trochanter collimated off view. No acute displaced fracture or dislocation of the hips. There is no evidence of pelvic fracture or diastasis. No pelvic bone lesions are seen. Surgical clips overlie the pelvis. IMPRESSION: 1.  Negative for acute traumatic injury. 2. Right femoral trochanter collimated off view. Electronically Signed   By: Morgane  Naveau M.D.   On: 09/03/2023 22:42   CT HEAD WO CONTRAST Result Date: 09/03/2023 CLINICAL DATA:  Pt fell today and hit the back of his head on the floor. Alert and oriented, but does not remember the fall. No obvious injury. Patient denies pain. EXAM: CT HEAD WITHOUT CONTRAST CT CERVICAL SPINE WITHOUT CONTRAST TECHNIQUE: Multidetector CT imaging of the head and cervical spine was performed following the standard protocol without intravenous contrast. Multiplanar CT image reconstructions of the cervical spine were also generated. RADIATION DOSE REDUCTION: This exam was performed according to the departmental dose-optimization program which includes automated exposure control, adjustment of the mA and/or kV according to patient size and/or use of iterative reconstruction technique. COMPARISON:  CT head 11/11/2022 FINDINGS: CT HEAD FINDINGS Brain: Cerebral ventricle sizes are concordant with the degree of cerebral volume loss. Patchy and confluent areas of decreased attenuation are noted throughout the deep and periventricular white matter of the cerebral hemispheres bilaterally, compatible with chronic microvascular ischemic disease. No evidence of large-territorial acute infarction. No parenchymal hemorrhage. No mass lesion. No extra-axial collection. No mass effect or midline shift. No hydrocephalus. Basilar cisterns are patent. Vascular: No hyperdense vessel. Atherosclerotic  calcifications are present within the cavernous internal carotid arteries. Skull: No acute fracture or focal lesion. Sinuses/Orbits: Paranasal sinuses and mastoid air cells are clear. Bilateral lens replacement. Otherwise the orbits are unremarkable. Other: Left nasolacrimal duct enlargement (3:10). CT CERVICAL SPINE FINDINGS Alignment: Normal. Skull base and vertebrae: Multilevel moderate to severe degenerative changes spine with associated at least moderate left C4-C5 osseous neural foraminal stenosis. No severe osseous central canal stenosis. + No acute fracture. No aggressive appearing focal osseous lesion or focal pathologic process. Soft tissues and spinal canal: No prevertebral fluid or swelling. No visible canal hematoma. Upper chest: Unremarkable. Other: Atherosclerotic plaque of the aortic arch and its main branches. IMPRESSION: 1. No acute intracranial abnormality. 2. No acute displaced fracture or traumatic listhesis of the cervical spine. 3. Indeterminate left nasolacrimal duct enlargement. Correlate with clinical exam. 4.  Aortic Atherosclerosis (ICD10-I70.0). Electronically Signed   By: Morgane  Naveau M.D.   On: 09/03/2023 22:40   CT CERVICAL SPINE WO CONTRAST Result Date: 09/03/2023 CLINICAL DATA:  Pt fell today and  hit the back of his head on the floor. Alert and oriented, but does not remember the fall. No obvious injury. Patient denies pain. EXAM: CT HEAD WITHOUT CONTRAST CT CERVICAL SPINE WITHOUT CONTRAST TECHNIQUE: Multidetector CT imaging of the head and cervical spine was performed following the standard protocol without intravenous contrast. Multiplanar CT image reconstructions of the cervical spine were also generated. RADIATION DOSE REDUCTION: This exam was performed according to the departmental dose-optimization program which includes automated exposure control, adjustment of the mA and/or kV according to patient size and/or use of iterative reconstruction technique. COMPARISON:  CT  head 11/11/2022 FINDINGS: CT HEAD FINDINGS Brain: Cerebral ventricle sizes are concordant with the degree of cerebral volume loss. Patchy and confluent areas of decreased attenuation are noted throughout the deep and periventricular white matter of the cerebral hemispheres bilaterally, compatible with chronic microvascular ischemic disease. No evidence of large-territorial acute infarction. No parenchymal hemorrhage. No mass lesion. No extra-axial collection. No mass effect or midline shift. No hydrocephalus. Basilar cisterns are patent. Vascular: No hyperdense vessel. Atherosclerotic calcifications are present within the cavernous internal carotid arteries. Skull: No acute fracture or focal lesion. Sinuses/Orbits: Paranasal sinuses and mastoid air cells are clear. Bilateral lens replacement. Otherwise the orbits are unremarkable. Other: Left nasolacrimal duct enlargement (3:10). CT CERVICAL SPINE FINDINGS Alignment: Normal. Skull base and vertebrae: Multilevel moderate to severe degenerative changes spine with associated at least moderate left C4-C5 osseous neural foraminal stenosis. No severe osseous central canal stenosis. + No acute fracture. No aggressive appearing focal osseous lesion or focal pathologic process. Soft tissues and spinal canal: No prevertebral fluid or swelling. No visible canal hematoma. Upper chest: Unremarkable. Other: Atherosclerotic plaque of the aortic arch and its main branches. IMPRESSION: 1. No acute intracranial abnormality. 2. No acute displaced fracture or traumatic listhesis of the cervical spine. 3. Indeterminate left nasolacrimal duct enlargement. Correlate with clinical exam. 4.  Aortic Atherosclerosis (ICD10-I70.0). Electronically Signed   By: Morgane  Naveau M.D.   On: 09/03/2023 22:40    DISCHARGE EXAMINATION: Vitals:   09/04/23 1700 09/04/23 2031 09/05/23 0007 09/05/23 1238  BP: 136/85  123/73   Pulse: 80 96 92   Resp: 16 18 18 18   Temp: (P) 97.6 F (36.4 C) 98.3  F (36.8 C) 98.7 F (37.1 C)   TempSrc: (P) Oral Oral Axillary   SpO2: 93% 95% 100% 95%  Weight: 128.3 kg     Height: 5\' 11"  (1.803 m)      General appearance: Awake alert.  In no distress Resp: Clear to auscultation bilaterally.  Normal effort Cardio: S1-S2 is normal regular.  No S3-S4.  No rubs murmurs or bruit GI: Abdomen is soft.  Nontender nondistended.  Bowel sounds are present normal.  No masses organomegaly Extremities: Improved erythema bilateral lower extremities   DISPOSITION: Home  Discharge Instructions     (HEART FAILURE PATIENTS) Call MD:  Anytime you have any of the following symptoms: 1) 3 pound weight gain in 24 hours or 5 pounds in 1 week 2) shortness of breath, with or without a dry hacking cough 3) swelling in the hands, feet or stomach 4) if you have to sleep on extra pillows at night in order to breathe.   Complete by: As directed    Call MD for:  difficulty breathing, headache or visual disturbances   Complete by: As directed    Call MD for:  extreme fatigue   Complete by: As directed    Call MD for:  hives  Complete by: As directed    Call MD for:  persistant dizziness or light-headedness   Complete by: As directed    Call MD for:  persistant nausea and vomiting   Complete by: As directed    Call MD for:  severe uncontrolled pain   Complete by: As directed    Call MD for:  temperature >100.4   Complete by: As directed    Diet - low sodium heart healthy   Complete by: As directed    Discharge instructions   Complete by: As directed    Please note that spironolactone and Cardizem have been discontinued.  The dose of furosemide has been decreased.  The dose of potassium has been decreased.  Please be sure to follow-up with your primary care provider in 1 week.  Cardiology will arrange a heart monitor.  You were cared for by a hospitalist during your hospital stay. If you have any questions about your discharge medications or the care you received while  you were in the hospital after you are discharged, you can call the unit and asked to speak with the hospitalist on call if the hospitalist that took care of you is not available. Once you are discharged, your primary care physician will handle any further medical issues. Please note that NO REFILLS for any discharge medications will be authorized once you are discharged, as it is imperative that you return to your primary care physician (or establish a relationship with a primary care physician if you do not have one) for your aftercare needs so that they can reassess your need for medications and monitor your lab values. If you do not have a primary care physician, you can call [phone] for a physician referral.   Increase activity slowly   Complete by: As directed    No wound care   Complete by: As directed          Allergies as of 09/05/2023       Reactions   Lipitor [atorvastatin] Other (See Comments)   Myalgias   Zithromax [azithromycin] Other (See Comments)   GI intolerance   Zocor [simvastatin] Other (See Comments)   Myalgias    Norvasc [amlodipine] Swelling        Medication List     STOP taking these medications    diltiazem 300 MG 24 hr capsule Commonly known as: CARDIZEM CD   spironolactone 100 MG tablet Commonly known as: ALDACTONE       TAKE these medications    allopurinol 100 MG tablet Commonly known as: ZYLOPRIM Take 100 mg by mouth daily.   apixaban 5 MG Tabs tablet Commonly known as: Eliquis TAKE 1 TABLET(5 MG) BY MOUTH TWICE DAILY What changed:  how much to take how to take this when to take this additional instructions   benzonatate 100 MG capsule Commonly known as: TESSALON Take 100 mg by mouth 3 (three) times daily as needed.   doxycycline 100 MG tablet Commonly known as: VIBRA-TABS Take 1 tablet (100 mg total) by mouth every 12 (twelve) hours for 5 days.   ezetimibe 10 MG tablet Commonly known as: ZETIA Take 10 mg by mouth  daily.   FISH OIL PO Take 1 capsule by mouth 2 (two) times daily.   FLUoxetine 10 MG capsule Commonly known as: PROZAC Take 10 mg by mouth every evening.   furosemide 20 MG tablet Commonly known as: LASIX Take 1 tablet (20 mg total) by mouth daily. What changed:  medication strength how  much to take   guaiFENesin 600 MG 12 hr tablet Commonly known as: MUCINEX Take 600 mg by mouth 2 (two) times daily as needed for cough or to loosen phlegm.   melatonin 5 MG Tabs Take 5 mg by mouth at bedtime.   metFORMIN 1000 MG tablet Commonly known as: GLUCOPHAGE Take 1,000 mg by mouth 2 (two) times daily with a meal.   metoprolol succinate 50 MG 24 hr tablet Commonly known as: TOPROL-XL Take 1.5 tablets (75 mg total) by mouth daily.   Multivitamin Men 50+ Tabs Take 1 tablet by mouth every evening.   omeprazole 20 MG capsule Commonly known as: PRILOSEC Take 1 capsule (20 mg total) by mouth daily.   potassium chloride 10 MEQ tablet Commonly known as: KLOR-CON M Take 1 tablet (10 mEq total) by mouth daily. What changed:  medication strength how much to take   pregabalin 100 MG capsule Commonly known as: LYRICA Take 100 mg by mouth 2 (two) times a day.   Soliqua 100-33 UNT-MCG/ML Sopn Generic drug: Insulin Glargine-Lixisenatide Inject 22 Units into the skin daily.          Follow-up Information     [facility] Follow up.   Specialty: Cardiology Why: cardiology service will mail you a 2 week heart monitor Contact information: [address] [place]  [address] [phone]        Jacqueline Matsu, MD Follow up on 10/05/2023.   Specialty: Cardiology Why: 1:00PM. Sleep clinic Contact information: [address] [place] [address] [phone]         Sheryle Donning, MD Follow up on 10/20/2023.   Specialty: Cardiology Why: 3:20PM. Cardiology follow up Contact information: [address] [place] [address] [phone]         Glena Landau, MD Follow up.   Specialty: Family Medicine Why: for labs to check CBC and BMET Contact information: [address] [place] [address] [phone]                 TOTAL DISCHARGE TIME: 35 minutes  Decklin Weddington Lyndon Santiago  [facility] Pager on [email or web address]  09/05/2023, 1:21 PM

## 2023-09-05 NOTE — Evaluation (Signed)
 Occupational Therapy Evaluation Patient Details Name: Steve Jensen MRN: [id number] DOB: 12-10-1940 Today's Date: 09/05/2023   History of Present Illness   Pt is 83 yo presenting to [facility] ED on 4/10 due to fall which involved striking the back of his head. No significant findings on CT scans. PMH: a-fib, HTN, chronic HF-EF, OSA on CPAP, DM II, CKD III, GERD, hepatocellular carcinoma, prostate cancer.     Clinical Impressions At baseline, pt is Independent to Mod I with ADLS, Mod I with functional mobility with a standard walker or Rollator, and receives assistance from his family for IADLs. Pt reports at least 4 falls in past 6 months with pt requiring assist to get up from all falls. Pt now presents with decreased activity tolerance, decreased B UE strength, mildly decreased cognition, decreased balance, impaired cardiopulmonary status, and decreased safety and independence with functional tasks. Pt currently demonstrates ability to complete UB ADLs with Set up to Min assist, LB ADLs with Errol assist +2, bed mobility with Mod assist, and functional transfers with a RW with Contact guard assist and cues.   Pt BP in supine 120/80, in sitting 123/82, upon standing 121/94, and at 3 minutes standing 124/28. Pt HR laregely in the 90s to low-100s but with HR up to 141 bpm in standing at 3 minutes. Pt O2 sat >/94% on RA throughout session. Pt reporting no dizziness or lightheadedness throughout session. However, pt had an episode of bowel incontinence upon standing and reports this frequently happens upon standing at baseline.   Pt will benefit from acute skilled OT services to address deficits outlined below and to increase safety and independence with functional tasks. Family reports they are not able to safely provide level of assist pt currently needs for ADLs with family also reporting concerns about risk of additional falls. Based on pt current functional level, history of multiple falls, and family  report, OT recommends post-acute intensive inpatient skilled rehab services < 3 hours per day to maximize rehab potential, decrease caregiver burden, decrease the risk of further falls, and decrease the risk of rehospitalization. .      If plan is discharge home, recommend the following:   A little help with walking and/or transfers;A lot of help with bathing/dressing/bathroom;Two people to help with bathing/dressing/bathroom;Assistance with cooking/housework;Direct supervision/assist for medications management;Direct supervision/assist for financial management;Assist for transportation;Help with stairs or ramp for entrance     Functional Status Assessment   Patient has had a recent decline in their functional status and demonstrates the ability to make significant improvements in function in a reasonable and predictable amount of time.     Equipment Recommendations   Other (comment) (TBD based on pt progress)     Recommendations for Other Services         Precautions/Restrictions   Precautions Precautions: Fall;Other (comment) Precaution/Restrictions Comments: watch HR and BP Restrictions Weight Bearing Restrictions Per Provider Order: No     Mobility Bed Mobility Overal bed mobility: Needs Assistance Bed Mobility: Supine to Sit     Supine to sit: Mod assist, HOB elevated, Used rails     General bed mobility comments: Mod A to elevate trunk and  bring hips to EOB with use of bed pad; cues for hand placement/technique    Transfers Overall transfer level: Needs assistance Equipment used: Rolling walker (2 wheels) Transfers: Sit to/from Stand, Bed to chair/wheelchair/BSC Sit to Stand: Contact guard assist, From elevated surface     Step pivot transfers: Contact guard assist, From elevated surface  General transfer comment: verbal cues for safe hand placement in order to prevent pulling up from RW.      Balance Overall balance assessment: Needs  assistance, History of Falls Sitting-balance support: Single extremity supported, Bilateral upper extremity supported, Feet supported Sitting balance-Leahy Scale: Fair Sitting balance - Comments: Pt able to maintain static sitting balance; pt requiring CGA to Min assist to maintain balance in dynamic sitting at EOB Postural control: Posterior lean Standing balance support: Single extremity supported, Bilateral upper extremity supported, No upper extremity supported, During functional activity, Reliant on assistive device for balance Standing balance-Leahy Scale: Fair Standing balance comment: Pt able to briefly maintain static standing balance. Pt requiring UE support for static stand >1 minute and in dynamic standing and stepping                           ADL either performed or assessed with clinical judgement   ADL Overall ADL's : Needs assistance/impaired Eating/Feeding: Set up;Sitting   Grooming: Set up;Sitting   Upper Body Bathing: Contact guard assist;Minimal assistance;Sitting   Lower Body Bathing: Maximal assistance;+2 for safety/equipment;Sit to/from stand   Upper Body Dressing : Contact guard assist;Sitting   Lower Body Dressing: Maximal assistance;+2 for safety/equipment;Sit to/from stand   Toilet Transfer: Contact guard assist;BSC/3in1;Rolling walker (2 wheels);Cueing for safety (step-pivot transfer; from elevated surface) Toilet Transfer Details (indicate cue type and reason): simulated bed to chair Toileting- Clothing Manipulation and Hygiene: Maximal assistance;+2 for safety/equipment;Sit to/from stand       Functional mobility during ADLs:  (further mobility deferred this session due to pt with increased HR up to 141 bpm in standing) General ADL Comments: Pt with decreased activity tolerance and impaired cardiopulmonary status affecting funcitonal level.     Vision Baseline Vision/History: 1 Wears glasses Ability to See in Adequate Light: 0 Adequate  (with glasses) Patient Visual Report: No change from baseline       Perception         Praxis         Pertinent Vitals/Pain Pain Assessment Pain Assessment: No/denies pain     Extremity/Trunk Assessment Upper Extremity Assessment Upper Extremity Assessment: Right hand dominant;Generalized weakness (Gross B shoulder strength 3/5; otherwise B UE strength 4-/5; B UE ROM and coordinaiton WFL)   Lower Extremity Assessment Lower Extremity Assessment: Defer to PT evaluation   Cervical / Trunk Assessment Cervical / Trunk Assessment: Kyphotic   Communication Communication Communication: No apparent difficulties   Cognition Arousal: Alert Behavior During Therapy: WFL for tasks assessed/performed Cognition: Cognition impaired     Awareness: Intellectual awareness intact, Online awareness impaired Memory impairment (select all impairments): Working memory Attention impairment (select first level of impairment): Alternating attention Executive functioning impairment (select all impairments): Problem solving, Organization OT - Cognition Comments: Pt AAOx4 and pleasant throughout session with mild deficits noted above. Pt often tangential in conversation, but appears to be doing so to avoid activity with family present also telling pt they knew he was "stalling" and with pt stating to "not give away my secrets."                 Following commands: Intact       Cueing  General Comments   Cueing Techniques: Verbal cues;Visual cues  Pt BP in supine 120/80, in sitting 123/82, upon standing 121/94, and at 3 minutes standing 124/28. Pt HR laregely in the 90s to low-100s but with HR up to 141 bpm in standing at 3 minutes.  Pt O2 sat >/94% on RA throughout session. Pt reporting no dizziness or lightheadedness throughout session. However, pt had an episode of bowel incontinence upon standing and reports this frequently happens upon standing at baseline. MD, NT, and RN each present  during a poriton of session. Pt's son and DIL present during majority of session.   Exercises     Shoulder Instructions      Home Living Family/patient expects to be discharged to:: Private residence Living Arrangements: Children;Other relatives (lives with grandson and his family including granddaughter in Social worker and children) Available Help at Discharge: Family;Available 24 hours/day (Pt's son and DIL also live next door.) Type of Home: House Home Access: Ramped entrance     Home Layout: Two level;Laundry or work area in basement;Able to live on main level with bedroom/bathroom Alternate Teacher, music of Steps: flight   Bathroom Shower/Tub: Chief Strategy Officer: Standard     Home Equipment: Retail banker (4 wheels);Cane - single point;BSC/3in1;Grab bars - tub/shower;Shower seat;Lift chair;Other (comment) (Hoyer-style lift)   Additional Comments: Pt is typically with his DIL and great-grandchildren during the day, as DIL is the main caregiver for the children when pt's granddaughter-in-law is at work and grandson is asleep, as he works nights. DIL reports she is not able to provide level of assist pt currently needs for ADLs and reports concern over pt falling in the home and not being able to assist him or having them both be injuried if he falls.      Prior Functioning/Environment Prior Level of Function : Needs assist;History of Falls (last six months)             Mobility Comments: At baseline, pt reports use of standard walker at home, but reports more frequent use of the Rollator over the past few weeks. On this day, pt reports at least 4 falls in past 6 months including fall leading to this admission and one day about a week ago when he tripped and fell over his dog x3. Pt also reports during fall in bathroom, he became wedged between toilet and tub. Pt reports needing assist to get up from all falls. ADLs Comments: At baseline, pt completes ADLs  Independent to Mod I and recieves assist from family for IADLs, including meal prep, cleaning, laundry, managing finances, and transportation.    OT Problem List: Decreased strength;Decreased activity tolerance;Impaired balance (sitting and/or standing);Decreased cognition;Decreased safety awareness;Decreased knowledge of use of DME or AE;Decreased knowledge of precautions;Cardiopulmonary status limiting activity   OT Treatment/Interventions: Self-care/ADL training;Therapeutic exercise;DME and/or AE instruction;Cognitive remediation/compensation;Patient/family education;Balance training;Therapeutic activities      OT Goals(Current goals can be found in the care plan section)   Acute Rehab OT Goals Patient Stated Goal: to not fall again and to return home OT Goal Formulation: With patient/family Time For Goal Achievement: 09/19/23 Potential to Achieve Goals: Good ADL Goals Pt Will Perform Grooming: with modified independence;standing Pt Will Perform Lower Body Bathing: with contact guard assist;sit to/from stand (with adaptive equipment as needed) Pt Will Perform Lower Body Dressing: with contact guard assist;sit to/from stand (with adaptive equipment as needed) Pt Will Transfer to Toilet: with modified independence;ambulating;bedside commode (with least restrictive AD) Pt Will Perform Toileting - Clothing Manipulation and hygiene: with contact guard assist;sit to/from stand Pt/caregiver will Perform Home Exercise Program: Increased strength;Both right and left upper extremity;With theraband;With Supervision;With written HEP provided (increased activity tolerance) Additional ADL Goal #1: Patient will demonstrate understanding of training in fall prevention strategies through teach back  with handout provided.   OT Frequency:  Min 2X/week    Co-evaluation              AM-PAC OT "6 Clicks" Daily Activity     Outcome Measure Help from another person eating meals?: A Little Help from  another person taking care of personal grooming?: A Little Help from another person toileting, which includes using toliet, bedpan, or urinal?: A Lot Help from another person bathing (including washing, rinsing, drying)?: A Lot Help from another person to put on and taking off regular upper body clothing?: A Little Help from another person to put on and taking off regular lower body clothing?: A Lot 6 Click Score: 15   End of Session Equipment Utilized During Treatment: Gait belt;Rolling walker (2 wheels) Nurse Communication: Mobility status;Other (comment) (Vital signs. Two partially disolved pills found in pt's bedding with pt reporting he had dropped three pills earlier but third pill not found by OT.)  Activity Tolerance: Patient tolerated treatment well Patient left: in chair;with call bell/phone within reach;with family/visitor present (chair alarm in place with family instructed and agreeing to notify nursing staff when leaving to turn on chair alarm)  OT Visit Diagnosis: Unsteadiness on feet (R26.81);Other abnormalities of gait and mobility (R26.89);Muscle weakness (generalized) (M62.81);Repeated falls (R29.6);History of falling (Z91.81);Other symptoms and signs involving cognitive function;Other (comment) (decreased activity tolerance)                Time: 0865-7846 OT Time Calculation (min): 53 min Charges:  OT General Charges $OT Visit: 1 Visit OT Evaluation $OT Eval Low Complexity: 1 Low OT Treatments $Self Care/Home Management : 38-52 mins  Adian Jablonowski "Darral Ellis., OTR/L, MA Acute Rehab [phone]   Walt Gunner 09/05/2023, 1:25 PM

## 2023-09-05 NOTE — Care Management Obs Status (Signed)
 MEDICARE OBSERVATION STATUS NOTIFICATION   Patient Details  Name: Steve Jensen MRN: [id number] Date of Birth: 25-Jul-1940   Medicare Observation Status Notification Given:  Yes    Cindia Crease, RN 09/05/2023, 9:27 AM

## 2023-09-06 DIAGNOSIS — I5032 Chronic diastolic (congestive) heart failure: Secondary | ICD-10-CM | POA: Diagnosis not present

## 2023-09-06 DIAGNOSIS — I4811 Longstanding persistent atrial fibrillation: Secondary | ICD-10-CM | POA: Diagnosis not present

## 2023-09-06 LAB — BASIC METABOLIC PANEL WITH GFR
Anion gap: 13 (ref 5–15)
BUN: 31 mg/dL — ABNORMAL HIGH (ref 8–23)
CO2: 19 mmol/L — ABNORMAL LOW (ref 22–32)
Calcium: 9.1 mg/dL (ref 8.9–10.3)
Chloride: 100 mmol/L (ref 98–111)
Creatinine, Ser: 1.65 mg/dL — ABNORMAL HIGH (ref 0.61–1.24)
GFR, Estimated: 41 mL/min — ABNORMAL LOW
Glucose, Bld: 265 mg/dL — ABNORMAL HIGH (ref 70–99)
Potassium: 3.7 mmol/L (ref 3.5–5.1)
Sodium: 132 mmol/L — ABNORMAL LOW (ref 135–145)

## 2023-09-06 LAB — MAGNESIUM: Magnesium: 1.9 mg/dL (ref 1.7–2.4)

## 2023-09-06 LAB — GLUCOSE, CAPILLARY
Glucose-Capillary: 187 mg/dL — ABNORMAL HIGH (ref 70–99)
Glucose-Capillary: 180 mg/dL — ABNORMAL HIGH (ref 70–99)
Glucose-Capillary: 220 mg/dL — ABNORMAL HIGH (ref 70–99)
Glucose-Capillary: 176 mg/dL — ABNORMAL HIGH (ref 70–99)

## 2023-09-06 MED ORDER — SODIUM CHLORIDE 0.45 % IV SOLN: INTRAVENOUS | Status: DC

## 2023-09-06 NOTE — Plan of Care (Signed)
   Problem: Education: Goal: Knowledge of General Education information will improve Description: Including pain rating scale, medication(s)/side effects and non-pharmacologic comfort measures Outcome: Progressing   Problem: Activity: Goal: Risk for activity intolerance will decrease Outcome: Progressing   Problem: Skin Integrity: Goal: Risk for impaired skin integrity will decrease Outcome: Progressing

## 2023-09-06 NOTE — Progress Notes (Addendum)
 [facility] PROGRESS NOTE   Steve Jensen ZOX:[id number] DOB: [birth year] DOA: 09/03/2023  PCP: Glena Landau, MD  Brief History: 83 year old with history of A-fib on Eliquis, HTN, chronic CHF with preserved EF, OSA on CPAP, DM2, obesity, CKD 3A, hepatocellular carcinoma, GERD, prostate cancer comes to the ED after a fall and struck the back of the head. Upon admission trauma workup including CT head and cervical spine were negative. Was found to have AKI and hyperkalemia. Patient was given Lokelma, IV calcium gluconate, insulin and D50, nebulizer.    Consultations: Cardiology   Procedures: Echocardiogram EEG Carotid Doppler    Subjective/Interval History: Patient feels well.  No new complaints offered.  Understands that he may have to go to rehab.  Denies any dizziness or lightheadedness.  No shortness of breath.  No chest pain.    Assessment/Plan:  Fall at home with concern for near syncope Patient not entirely certain if he actually passed out or not. Patient was seen by cardiology. Underwent echocardiogram which showed LVEF of 55 to 60%.  No significant valvular abnormalities noted.  Carotid Doppler unremarkable.  EEG was unremarkable.   Orthostatics did not show any significant changes blood pressure or heart rate.   TSH noted to be normal.  Cortisol level is borderline low.  However clinically low suspicion for adrenal insufficiency. Seen by PT and OT.  He is requiring a lot of assistance with mobility.  His family will be unable to take care of him at home.  Short-term rehab was recommended.  TOC consulted. Cardiology to arrange heart monitor.   A-fib, chronic Will remain only on metoprolol going forward.  Cardizem has been discontinued.  Cardiology to arrange outpatient monitor.     Right knee skin abrasion with possible cellulitis Secondary to mechanical fall a week ago.   Continue doxycycline.   WBC is noted to be better.  Right knee looks better.    AKI on CKD stage IIIa Prerenal in nature.  Baseline creatinine 1.2, admission 2.12, improved with IV fluids.  Creatinine was down to 1.31 yesterday.  Creatinine noted to be 1.63 this morning.  Possibly due to poor oral intake.  Will give another 24 hours of IV fluids.   Hyperkalemia Received treatment in the ER, improved. Spironolactone has been discontinued.   Insulin-dependent type 2 diabetes A1c 7.5.  Continue home medications   Chronic HFpEF Appears to be euvolemic.  Lasix has been decreased.  Spironolactone discontinued.  Echocardiogram shows normal LVEF.   Hypertension Continue to monitor blood pressure.   OSA Continue nightly CPAP.   History of hepatocellular carcinoma - Underwent micro ablation and transcatheter embolization about 5 years ago.  Currently under yearly surveillance     Obesity Estimated body mass index is 39.45 kg/m as calculated from the following:   Height as of this encounter: 5\' 11"  (1.803 m).   Weight as of this encounter: 128.3 kg.    DVT Prophylaxis: Paxipam Code Status: DNR Family Communication: Discussed with patient.  No family at bedside Disposition Plan: SNF for short-term rehab is being pursued  Status is: Observation The patient will require care spanning > 2 midnights and should be moved to inpatient because: Unsafe discharge      Medications: Scheduled:  allopurinol  100 mg Oral Daily   apixaban  5 mg Oral BID   doxycycline  100 mg Oral Q12H   ezetimibe  10 mg Oral Daily   FLUoxetine  10 mg Oral QPM   insulin aspart  0-15  Units Subcutaneous TID WC   insulin aspart  0-5 Units Subcutaneous QHS   melatonin  5 mg Oral QHS   metoprolol succinate  75 mg Oral Daily   pantoprazole  40 mg Oral Daily   pregabalin  100 mg Oral BID   Continuous: UJW:[id number] **OR** acetaminophen, glucagon (human recombinant), guaiFENesin, hydrALAZINE, ipratropium-albuterol, metoprolol tartrate, ondansetron (ZOFRAN) IV,  senna-docusate  Antibiotics: Anti-infectives (From admission, onward)    Start     Dose/Rate Route Frequency Ordered Stop   09/05/23 0000  doxycycline (VIBRA-TABS) 100 MG tablet        100 mg Oral Every 12 hours 09/05/23 1230 09/10/23 2359   09/04/23 1000  doxycycline (VIBRA-TABS) tablet 100 mg        100 mg Oral Every 12 hours 09/04/23 0503 09/11/23 0959       Objective:  Vital Signs  Vitals:   09/05/23 1238 09/05/23 1517 09/05/23 2050 09/06/23 0447  BP:  116/65 105/89 (!) 138/93  Pulse:  (!) 115 (!) 109 89  Resp: 18 14 16 17   Temp:  (!) 97.4 F (36.3 C) 97.7 F (36.5 C) 98.1 F (36.7 C)  TempSrc:  Oral Oral Oral  SpO2: 95%  94% 95%  Weight:      Height:        Intake/Output Summary (Last 24 hours) at 09/06/2023 1006 Last data filed at 09/06/2023 0450 Gross per 24 hour  Intake 240 ml  Output 500 ml  Net -260 ml   Filed Weights   09/03/23 2046 09/04/23 1700  Weight: 127.5 kg 128.3 kg    General appearance: Awake alert.  In no distress Resp: Clear to auscultation bilaterally.  Normal effort Cardio: S1-S2 is normal regular.  No S3-S4.  No rubs murmurs or bruit GI: Abdomen is soft.  Nontender nondistended.  Bowel sounds are present normal.  No masses organomegaly No focal neurological deficits.  Physical deconditioning is noted.  Lab Results:  Data Reviewed: I have personally reviewed following labs and reports of the imaging studies  CBC: Recent Labs  Lab 09/03/23 2103 09/04/23 0516 09/05/23 1131  WBC 19.5* 18.6* 16.9*  HGB 14.3 13.2 13.6  HCT 44.5 43.5 41.7  MCV 82.4 87.2 81.0  PLT 509* 442* 462*    Basic Metabolic Panel: Recent Labs  Lab 09/03/23 2103 09/04/23 0516 09/05/23 0612  NA 135 137 136  K 6.3* 5.1 4.1  CL 99 103 102  CO2 23 21* 22  GLUCOSE 184* 123* 145*  BUN 39* 35* 21  CREATININE 2.12* 1.77* 1.31*  CALCIUM 9.5 9.3 9.1  MG  --   --  1.8    GFR: Estimated Creatinine Clearance: 60.4 mL/min (A) (by C-G formula based on SCr of  1.31 mg/dL (H)).  Liver Function Tests: Recent Labs  Lab 09/03/23 2103  AST 31  ALT 20  ALKPHOS 91  BILITOT 0.4  PROT 7.0  ALBUMIN 3.1*     Coagulation Profile: Recent Labs  Lab 09/03/23 2103  INR 1.3*    HbA1C: Recent Labs    09/04/23 0516  HGBA1C 7.5*    CBG: Recent Labs  Lab 09/05/23 0732 09/05/23 1208 09/05/23 1645 09/05/23 2116 09/06/23 0913  GLUCAP 163* 156* 300* 142* 187*    Thyroid Function Tests: Recent Labs    09/04/23 1722  TSH 2.024     Recent Results (from the past 240 hours)  Resp panel by RT-PCR (RSV, Flu A&B, Covid) Anterior Nasal Swab     Status: None   Collection  Time: 09/04/23  1:35 PM   Specimen: Anterior Nasal Swab  Result Value Ref Range Status   SARS Coronavirus 2 by RT PCR NEGATIVE NEGATIVE Final   Influenza A by PCR NEGATIVE NEGATIVE Final   Influenza B by PCR NEGATIVE NEGATIVE Final    Comment: (NOTE) The Xpert Xpress SARS-CoV-2/FLU/RSV plus assay is intended as an aid in the diagnosis of influenza from Nasopharyngeal swab specimens and should not be used as a sole basis for treatment. Nasal washings and aspirates are unacceptable for Xpert Xpress SARS-CoV-2/FLU/RSV testing.  Fact Sheet for Patients: BloggerCourse.com  Fact Sheet for Healthcare Providers: SeriousBroker.it  This test is not yet approved or cleared by the [place]  FDA and has been authorized for detection and/or diagnosis of SARS-CoV-2 by FDA under an Emergency Use Authorization (EUA). This EUA will remain in effect (meaning this test can be used) for the duration of the COVID-19 declaration under Section 564(b)(1) of the Act, 21 U.S.C. section 360bbb-3(b)(1), unless the authorization is terminated or revoked.     Resp Syncytial Virus by PCR NEGATIVE NEGATIVE Final    Comment: (NOTE) Fact Sheet for Patients: BloggerCourse.com  Fact Sheet for Healthcare  Providers: SeriousBroker.it  This test is not yet approved or cleared by the [place]  FDA and has been authorized for detection and/or diagnosis of SARS-CoV-2 by FDA under an Emergency Use Authorization (EUA). This EUA will remain in effect (meaning this test can be used) for the duration of the COVID-19 declaration under Section 564(b)(1) of the Act, 21 U.S.C. section 360bbb-3(b)(1), unless the authorization is terminated or revoked.  Performed at [facility], [address] [place], [place] [address]   Respiratory (~20 pathogens) panel by PCR     Status: None   Collection Time: 09/04/23  1:35 PM   Specimen: Anterior Nasal Swab; Respiratory  Result Value Ref Range Status   Adenovirus NOT DETECTED NOT DETECTED Final   Coronavirus 229E NOT DETECTED NOT DETECTED Final    Comment: (NOTE) The Coronavirus on the Respiratory Panel, DOES NOT test for the novel  Coronavirus (2019 nCoV)    Coronavirus HKU1 NOT DETECTED NOT DETECTED Final   Coronavirus NL63 NOT DETECTED NOT DETECTED Final   Coronavirus OC43 NOT DETECTED NOT DETECTED Final   Metapneumovirus NOT DETECTED NOT DETECTED Final   Rhinovirus / Enterovirus NOT DETECTED NOT DETECTED Final   Influenza A NOT DETECTED NOT DETECTED Final   Influenza B NOT DETECTED NOT DETECTED Final   Parainfluenza Virus 1 NOT DETECTED NOT DETECTED Final   Parainfluenza Virus 2 NOT DETECTED NOT DETECTED Final   Parainfluenza Virus 3 NOT DETECTED NOT DETECTED Final   Parainfluenza Virus 4 NOT DETECTED NOT DETECTED Final   Respiratory Syncytial Virus NOT DETECTED NOT DETECTED Final   Bordetella pertussis NOT DETECTED NOT DETECTED Final   Bordetella Parapertussis NOT DETECTED NOT DETECTED Final   Chlamydophila pneumoniae NOT DETECTED NOT DETECTED Final   Mycoplasma pneumoniae NOT DETECTED NOT DETECTED Final    Comment: Performed at [facility], [address], [place], [place] [address]      Radiology  Studies: EEG adult Result Date: 09/04/2023 Arleene Lack, MD     09/04/2023  5:54 PM Patient Name: Steve Jensen MRN: [id number] Epilepsy Attending: Arleene Lack Referring Physician/Provider: Juliette Oh, MD Date: 09/04/2023 Duration: 24.26 mins Patient history: 83yo M with syncope. EEG to evaluate for seizure Level of alertness: Awake AEDs during EEG study: PGB Technical aspects: This EEG study was done  with scalp electrodes positioned according to the 10-20 International system of electrode placement. Electrical activity was reviewed with band pass filter of 1-70Hz , sensitivity of 7 uV/mm, display speed of 50mm/sec with a 60Hz  notched filter applied as appropriate. EEG data were recorded continuously and digitally stored.  Video monitoring was available and reviewed as appropriate. Description: The posterior dominant rhythm consists of 8-9 Hz activity of moderate voltage (25-35 uV) seen predominantly in posterior head regions, symmetric and reactive to eye opening and eye closing. EEG showed intermittent generalized 3 to 6 Hz theta-delta slowing. Physiologic photic driving was not seen during photic stimulation.  Hyperventilation was not performed.   ABNORMALITY - Intermittent slow, generalized IMPRESSION: This study is suggestive of mild diffuse encephalopathy. No seizures or epileptiform discharges were seen throughout the recording. Arleene Lack   ECHOCARDIOGRAM COMPLETE Result Date: 09/04/2023    ECHOCARDIOGRAM REPORT   Patient Name:   Steve Jensen Date of Exam: 09/04/2023 Medical Rec #:  [id number]      Height:       70.0 in Accession #:    [id number]     Weight:       281.0 lb Date of Birth:  03/25/1941     BSA:          2.412 m Patient Age:    82 years       BP:           107/66 mmHg Patient Gender: M              HR:           95 bpm. Exam Location:  Inpatient Procedure: 2D Echo, Cardiac Doppler, Color Doppler and Intracardiac            Opacification Agent (Both Spectral and Color  Flow Doppler were            utilized during procedure). Indications:    Syncope  History:        Patient has prior history of Echocardiogram examinations, most                 recent 11/12/2022. Arrythmias:Atrial Fibrillation; Risk                 Factors:Sleep Apnea, Dyslipidemia, Diabetes and Hypertension.  Sonographer:    Sulema Endo RDCS, FE, PE Referring Phys: [id number] VASUNDHRA RATHORE IMPRESSIONS  1. Left ventricular ejection fraction, by estimation, is 55 to 60%. The left ventricle has normal function. The left ventricle has no regional wall motion abnormalities. Left ventricular diastolic function could not be evaluated.  2. Right ventricular systolic function is mildly reduced. The right ventricular size is mildly enlarged. Tricuspid regurgitation signal is inadequate for assessing PA pressure.  3. Left atrial size was mildly dilated.  4. The mitral valve is normal in structure. Trivial mitral valve regurgitation. No evidence of mitral stenosis.  5. The aortic valve is calcified. There is moderate calcification of the aortic valve. There is mild thickening of the aortic valve. Aortic valve regurgitation is mild. Aortic valve sclerosis/calcification is present, without any evidence of aortic stenosis.  6. The inferior vena cava is normal in size with greater than 50% respiratory variability, suggesting right atrial pressure of 3 mmHg. FINDINGS  Left Ventricle: Left ventricular ejection fraction, by estimation, is 55 to 60%. The left ventricle has normal function. The left ventricle has no regional wall motion abnormalities. Definity contrast agent was given IV to delineate the left ventricular  endocardial borders. The left  ventricular internal cavity size was normal in size. There is no left ventricular hypertrophy. Left ventricular diastolic function could not be evaluated due to atrial fibrillation. Left ventricular diastolic function could  not be evaluated. Right Ventricle: Poorly visualized but does  appear to have at least mildly reduced function. The right ventricular size is mildly enlarged. No increase in right ventricular wall thickness. Right ventricular systolic function is mildly reduced. Tricuspid regurgitation signal is inadequate for assessing PA pressure. Left Atrium: Left atrial size was mildly dilated. Right Atrium: Right atrial size was normal in size. Pericardium: There is no evidence of pericardial effusion. Mitral Valve: The mitral valve is normal in structure. Trivial mitral valve regurgitation. No evidence of mitral valve stenosis. Tricuspid Valve: The tricuspid valve is normal in structure. Tricuspid valve regurgitation is mild . No evidence of tricuspid stenosis. Aortic Valve: The aortic valve is calcified. There is moderate calcification of the aortic valve. There is mild thickening of the aortic valve. Aortic valve regurgitation is mild. Aortic valve sclerosis/calcification is present, without any evidence of aortic stenosis. Aortic valve mean gradient measures 3.0 mmHg. Aortic valve peak gradient measures 5.1 mmHg. Aortic valve area, by VTI measures 3.75 cm. Pulmonic Valve: The pulmonic valve was normal in structure. Pulmonic valve regurgitation is not visualized. No evidence of pulmonic stenosis. Aorta: The aortic root and ascending aorta are structurally normal, with no evidence of dilitation. Venous: The inferior vena cava is normal in size with greater than 50% respiratory variability, suggesting right atrial pressure of 3 mmHg. IAS/Shunts: No atrial level shunt detected by color flow Doppler.  LEFT VENTRICLE PLAX 2D LVIDd:         4.45 cm   Diastology LVIDs:         2.90 cm   LV e' medial:    11.20 cm/s LV PW:         1.00 cm   LV E/e' medial:  10.3 LV IVS:        1.00 cm   LV e' lateral:   12.00 cm/s LVOT diam:     2.50 cm   LV E/e' lateral: 9.6 LV SV:         83 LV SV Index:   34 LVOT Area:     4.91 cm  LEFT ATRIUM             Index LA Vol (A2C):   68.9 ml 28.57 ml/m LA Vol  (A4C):   87.4 ml 36.24 ml/m LA Biplane Vol: 79.9 ml 33.13 ml/m  AORTIC VALVE AV Area (Vmax):    4.03 cm AV Area (Vmean):   3.72 cm AV Area (VTI):     3.75 cm AV Vmax:           113.00 cm/s AV Vmean:          79.000 cm/s AV VTI:            0.221 m AV Peak Grad:      5.1 mmHg AV Mean Grad:      3.0 mmHg LVOT Vmax:         92.70 cm/s LVOT Vmean:        59.800 cm/s LVOT VTI:          0.169 m LVOT/AV VTI ratio: 0.76  AORTA Ao Root diam: 3.60 cm Ao Asc diam:  3.60 cm MITRAL VALVE                TRICUSPID VALVE MV Area (PHT): 4.68 cm  TR Peak grad:   12.4 mmHg MV Decel Time: 162 msec     TR Vmax:        176.00 cm/s MV E velocity: 115.00 cm/s MV A velocity: 55.90 cm/s   SHUNTS MV E/A ratio:  2.06         Systemic VTI:  0.17 m                             Systemic Diam: 2.50 cm Arta Lark Electronically signed by Arta Lark Signature Date/Time: 09/04/2023/10:45:13 AM    Final        LOS: 0 days   Maylene Spear  [facility] Pager on [email or web address]  09/06/2023, 10:06 AM

## 2023-09-07 DIAGNOSIS — D649 Anemia, unspecified: Secondary | ICD-10-CM | POA: Diagnosis not present

## 2023-09-07 DIAGNOSIS — Z741 Need for assistance with personal care: Secondary | ICD-10-CM | POA: Diagnosis not present

## 2023-09-07 DIAGNOSIS — I4821 Permanent atrial fibrillation: Secondary | ICD-10-CM | POA: Diagnosis present

## 2023-09-07 DIAGNOSIS — K5900 Constipation, unspecified: Secondary | ICD-10-CM | POA: Diagnosis not present

## 2023-09-07 DIAGNOSIS — E876 Hypokalemia: Secondary | ICD-10-CM | POA: Diagnosis not present

## 2023-09-07 DIAGNOSIS — R1314 Dysphagia, pharyngoesophageal phase: Secondary | ICD-10-CM | POA: Diagnosis not present

## 2023-09-07 DIAGNOSIS — K5909 Other constipation: Secondary | ICD-10-CM | POA: Diagnosis not present

## 2023-09-07 DIAGNOSIS — K219 Gastro-esophageal reflux disease without esophagitis: Secondary | ICD-10-CM | POA: Diagnosis present

## 2023-09-07 DIAGNOSIS — E860 Dehydration: Secondary | ICD-10-CM | POA: Diagnosis present

## 2023-09-07 DIAGNOSIS — I5032 Chronic diastolic (congestive) heart failure: Secondary | ICD-10-CM | POA: Diagnosis not present

## 2023-09-07 DIAGNOSIS — E1122 Type 2 diabetes mellitus with diabetic chronic kidney disease: Secondary | ICD-10-CM | POA: Diagnosis present

## 2023-09-07 DIAGNOSIS — Z79899 Other long term (current) drug therapy: Secondary | ICD-10-CM | POA: Diagnosis not present

## 2023-09-07 DIAGNOSIS — Z9181 History of falling: Secondary | ICD-10-CM | POA: Diagnosis not present

## 2023-09-07 DIAGNOSIS — I130 Hypertensive heart and chronic kidney disease with heart failure and stage 1 through stage 4 chronic kidney disease, or unspecified chronic kidney disease: Secondary | ICD-10-CM | POA: Diagnosis not present

## 2023-09-07 DIAGNOSIS — Z87891 Personal history of nicotine dependence: Secondary | ICD-10-CM | POA: Diagnosis not present

## 2023-09-07 DIAGNOSIS — K567 Ileus, unspecified: Secondary | ICD-10-CM | POA: Diagnosis not present

## 2023-09-07 DIAGNOSIS — Z6841 Body Mass Index (BMI) 40.0 and over, adult: Secondary | ICD-10-CM | POA: Diagnosis not present

## 2023-09-07 DIAGNOSIS — Z66 Do not resuscitate: Secondary | ICD-10-CM | POA: Diagnosis present

## 2023-09-07 DIAGNOSIS — M6281 Muscle weakness (generalized): Secondary | ICD-10-CM | POA: Diagnosis not present

## 2023-09-07 DIAGNOSIS — R55 Syncope and collapse: Secondary | ICD-10-CM | POA: Diagnosis not present

## 2023-09-07 DIAGNOSIS — I4811 Longstanding persistent atrial fibrillation: Secondary | ICD-10-CM | POA: Diagnosis not present

## 2023-09-07 DIAGNOSIS — E875 Hyperkalemia: Secondary | ICD-10-CM | POA: Diagnosis not present

## 2023-09-07 DIAGNOSIS — Z794 Long term (current) use of insulin: Secondary | ICD-10-CM | POA: Diagnosis not present

## 2023-09-07 DIAGNOSIS — I951 Orthostatic hypotension: Secondary | ICD-10-CM | POA: Diagnosis present

## 2023-09-07 DIAGNOSIS — E785 Hyperlipidemia, unspecified: Secondary | ICD-10-CM | POA: Diagnosis present

## 2023-09-07 DIAGNOSIS — D72829 Elevated white blood cell count, unspecified: Secondary | ICD-10-CM | POA: Diagnosis not present

## 2023-09-07 DIAGNOSIS — E1140 Type 2 diabetes mellitus with diabetic neuropathy, unspecified: Secondary | ICD-10-CM | POA: Diagnosis not present

## 2023-09-07 DIAGNOSIS — L0390 Cellulitis, unspecified: Secondary | ICD-10-CM | POA: Diagnosis not present

## 2023-09-07 DIAGNOSIS — D6869 Other thrombophilia: Secondary | ICD-10-CM | POA: Diagnosis present

## 2023-09-07 DIAGNOSIS — W19XXXD Unspecified fall, subsequent encounter: Secondary | ICD-10-CM | POA: Diagnosis not present

## 2023-09-07 DIAGNOSIS — D75839 Thrombocytosis, unspecified: Secondary | ICD-10-CM | POA: Diagnosis present

## 2023-09-07 DIAGNOSIS — J309 Allergic rhinitis, unspecified: Secondary | ICD-10-CM | POA: Diagnosis not present

## 2023-09-07 DIAGNOSIS — R404 Transient alteration of awareness: Secondary | ICD-10-CM | POA: Diagnosis not present

## 2023-09-07 DIAGNOSIS — N1831 Chronic kidney disease, stage 3a: Secondary | ICD-10-CM | POA: Diagnosis not present

## 2023-09-07 DIAGNOSIS — Z743 Need for continuous supervision: Secondary | ICD-10-CM | POA: Diagnosis not present

## 2023-09-07 DIAGNOSIS — N179 Acute kidney failure, unspecified: Secondary | ICD-10-CM | POA: Diagnosis not present

## 2023-09-07 DIAGNOSIS — L03115 Cellulitis of right lower limb: Secondary | ICD-10-CM | POA: Diagnosis present

## 2023-09-07 DIAGNOSIS — G4733 Obstructive sleep apnea (adult) (pediatric): Secondary | ICD-10-CM | POA: Diagnosis not present

## 2023-09-07 DIAGNOSIS — Z881 Allergy status to other antibiotic agents status: Secondary | ICD-10-CM | POA: Diagnosis not present

## 2023-09-07 DIAGNOSIS — Z888 Allergy status to other drugs, medicaments and biological substances status: Secondary | ICD-10-CM | POA: Diagnosis not present

## 2023-09-07 DIAGNOSIS — I482 Chronic atrial fibrillation, unspecified: Secondary | ICD-10-CM | POA: Diagnosis not present

## 2023-09-07 DIAGNOSIS — Z7401 Bed confinement status: Secondary | ICD-10-CM | POA: Diagnosis not present

## 2023-09-07 DIAGNOSIS — Z1152 Encounter for screening for COVID-19: Secondary | ICD-10-CM | POA: Diagnosis not present

## 2023-09-07 DIAGNOSIS — R278 Other lack of coordination: Secondary | ICD-10-CM | POA: Diagnosis not present

## 2023-09-07 DIAGNOSIS — R2681 Unsteadiness on feet: Secondary | ICD-10-CM | POA: Diagnosis not present

## 2023-09-07 LAB — BASIC METABOLIC PANEL WITH GFR
Anion gap: 10 (ref 5–15)
BUN: 30 mg/dL — ABNORMAL HIGH (ref 8–23)
CO2: 23 mmol/L (ref 22–32)
Calcium: 9.3 mg/dL (ref 8.9–10.3)
Chloride: 101 mmol/L (ref 98–111)
Creatinine, Ser: 1.47 mg/dL — ABNORMAL HIGH (ref 0.61–1.24)
GFR, Estimated: 48 mL/min — ABNORMAL LOW (ref >60)
Glucose, Bld: 217 mg/dL — ABNORMAL HIGH (ref 70–99)
Potassium: 4.3 mmol/L (ref 3.5–5.1)
Sodium: 134 mmol/L — ABNORMAL LOW (ref 135–145)

## 2023-09-07 LAB — CBC
HCT: 46.9 % (ref 39.0–52.0)
Hemoglobin: 15.1 g/dL (ref 13.0–17.0)
MCH: 26.3 pg (ref 26.0–34.0)
MCHC: 32.2 g/dL (ref 30.0–36.0)
MCV: 81.6 fL (ref 80.0–100.0)
Platelets: 488 10*3/uL — ABNORMAL HIGH (ref 150–400)
RBC: 5.75 MIL/uL (ref 4.22–5.81)
RDW: 18.2 % — ABNORMAL HIGH (ref 11.5–15.5)
WBC: 19.1 10*3/uL — ABNORMAL HIGH (ref 4.0–10.5)
nRBC: 0 % (ref 0.0–0.2)

## 2023-09-07 LAB — GLUCOSE, CAPILLARY
Glucose-Capillary: 256 mg/dL — ABNORMAL HIGH (ref 70–99)
Glucose-Capillary: 182 mg/dL — ABNORMAL HIGH (ref 70–99)
Glucose-Capillary: 203 mg/dL — ABNORMAL HIGH (ref 70–99)
Glucose-Capillary: 205 mg/dL — ABNORMAL HIGH (ref 70–99)

## 2023-09-07 LAB — MAGNESIUM: Magnesium: 1.9 mg/dL (ref 1.7–2.4)

## 2023-09-07 MED ORDER — TRAMADOL HCL 50 MG PO TABS: 50.0000 mg | ORAL_TABLET | Freq: Four times a day (QID) | ORAL | Status: DC | PRN

## 2023-09-07 MED ORDER — ORAL CARE MOUTH RINSE: 15.0000 mL | OROMUCOSAL | Status: DC | PRN

## 2023-09-07 NOTE — Progress Notes (Signed)
 Physical Therapy Treatment Patient Details Name: Steve Jensen MRN: [id number] DOB: Jul 30, 1940 Today's Date: 09/07/2023   History of Present Illness Pt is 83 yo presenting to [facility] ED on 4/10 due to fall which involved striking the back of his head. No significant findings on CT scans. PMH: a-fib, HTN, chronic HF-EF, OSA on CPAP, DM II, CKD III, GERD, hepatocellular carcinoma, prostate cancer.    PT Comments  Pt with increased popliteal pain with attempts at coming to upright and extending his knees. Pt requires mod-maxAx2 for coming to standing and for stepping and pivot transfers from recliner to [facility] and BSC to bed. Once on EoB pt requires mod A to bring LE into bed. PT believes given pt knee pain and decreased mobility today patient will benefit from continued inpatient follow up therapy, <3 hours/day. PT will continue to follow acutely.     If plan is discharge home, recommend the following: Help with stairs or ramp for entrance;Assist for transportation;Assistance with cooking/housework;Two people to help with walking and/or transfers;A lot of help with bathing/dressing/bathroom   Can travel by private vehicle     No  Equipment Recommendations  None recommended by PT       Precautions / Restrictions Precautions Precautions: Fall;Other (comment) Precaution/Restrictions Comments: watch HR and BP Restrictions Weight Bearing Restrictions Per Provider Order: No     Mobility  Bed Mobility Overal bed mobility: Needs Assistance Bed Mobility: Sit to Supine       Sit to supine: Mod assist   General bed mobility comments: pt able to bring trunk to bed surface and elevated LE but needs modA for moving them into the bed.    Transfers Overall transfer level: Needs assistance Equipment used: Rolling walker (2 wheels) Transfers: Sit to/from Stand, Bed to chair/wheelchair/BSC Sit to Stand: Sumit assist, Mod assist, +2 physical assistance   Step pivot transfers: Min assist        General transfer comment: attempted to come to standing from recliner to walker with maxA, pt with reports of intense popliteal pain with trying to extend his knees, report he has had it before but it is worse today. Pt then reports he is about to have a BM and would prefer to get to [facility], attempted to come to standing for squat pivot but again can not come up high enough, requested asistance from NT and with maxAx2 pt is able to stand and step pivot from recliner to [facility] on his R, after pt is finished he is able to lean forward onto elevated bed for pericare afterwards with modAx2 pt able to pivot back to bed    Ambulation/Gait               General Gait Details: unable to progress due to fatigue after using BSC       Balance Overall balance assessment: Needs assistance, History of Falls Sitting-balance support: Single extremity supported, Bilateral upper extremity supported, Feet supported Sitting balance-Leahy Scale: Fair Sitting balance - Comments: Pt able to maintain static sitting balance; pt requiring CGA to Min assist to maintain balance in dynamic sitting at EOB   Standing balance support: Bilateral upper extremity supported, During functional activity, Reliant on assistive device for balance Standing balance-Leahy Scale: Fair Standing balance comment: pt needs B UE support for and some outside support for balance in standing                            Communication  Communication Communication: No apparent difficulties  Cognition Arousal: Alert Behavior During Therapy: WFL for tasks assessed/performed   PT - Cognitive impairments: No apparent impairments                         Following commands: Intact      Cueing Cueing Techniques: Verbal cues, Visual cues     General Comments General comments (skin integrity, edema, etc.): HR Vladimir noted in 130s      Pertinent Vitals/Pain Pain Assessment Pain Assessment: 0-10 Faces Pain Scale: Hurts even  more Pain Location: low back in standing Pain Descriptors / Indicators: Aching     PT Goals (current goals can now be found in the care plan section) Acute Rehab PT Goals Patient Stated Goal: Stop falls, return home. PT Goal Formulation: With patient Time For Goal Achievement: 09/18/23 Potential to Achieve Goals: Good Progress towards PT goals: Not progressing toward goals - comment    Frequency    Min 2X/week       AM-PAC PT "6 Clicks" Mobility   Outcome Measure  Help needed turning from your back to your side while in a flat bed without using bedrails?: A Lot Help needed moving from lying on your back to sitting on the side of a flat bed without using bedrails?: Total Help needed moving to and from a bed to a chair (including a wheelchair)?: Total Help needed standing up from a chair using your arms (e.g., wheelchair or bedside chair)?: Total Help needed to walk in hospital room?: Total Help needed climbing 3-5 steps with a railing? : Total 6 Click Score: 7    End of Session Equipment Utilized During Treatment: Gait belt Activity Tolerance: Patient tolerated treatment well Patient left: in bed;with call bell/phone within reach;with bed alarm set Nurse Communication: Mobility status PT Visit Diagnosis: Unsteadiness on feet (R26.81);Other abnormalities of gait and mobility (R26.89);Repeated falls (R29.6);Muscle weakness (generalized) (M62.81)     Time: 2440-1027 PT Time Calculation (min) (ACUTE ONLY): 45 min  Charges:    $Therapeutic Activity: 38-52 mins PT General Charges $$ ACUTE PT VISIT: 1 Visit                     Kassi Esteve B. Jewel Mortimer PT, DPT Acute Rehabilitation Services Please use secure chat or  Call Office [phone]    Verlie Glisson [facility] 09/07/2023, 2:56 PM

## 2023-09-07 NOTE — Plan of Care (Signed)
  Problem: Nutritional: Goal: Maintenance of adequate nutrition will improve Outcome: Progressing   Problem: Activity: Goal: Risk for activity intolerance will decrease Outcome: Progressing   Problem: Nutrition: Goal: Adequate nutrition will be maintained Outcome: Progressing

## 2023-09-07 NOTE — Progress Notes (Signed)
 [facility] PROGRESS NOTE   Steve Jensen ACZ:[id number] DOB: Jul 09, 1940 DOA: 09/03/2023  PCP: Lupita Raider, MD  Brief History: 83 year old with history of A-fib on Eliquis, HTN, chronic CHF with preserved EF, OSA on CPAP, DM2, obesity, CKD 3A, hepatocellular carcinoma, GERD, prostate cancer comes to the ED after a fall and struck the back of the head. Upon admission trauma workup including CT head and cervical spine were negative. Was found to have AKI and hyperkalemia. Patient was given Lokelma, IV calcium gluconate, insulin and D50, nebulizer.    Consultations: Cardiology   Procedures: Echocardiogram EEG Carotid Doppler    Subjective/Interval History: Patient feels well.  No complaints of.  Feels a bit stronger actually.  No chest pain or shortness of breath.  Right knee feels better.     Assessment/Plan:  Fall at home with concern for near syncope Patient not entirely certain if he actually passed out or not. Patient was seen by cardiology. Underwent echocardiogram which showed LVEF of 55 to 60%.  No significant valvular abnormalities noted.  Carotid Doppler unremarkable.  EEG was unremarkable.   Orthostatics did not show any significant changes blood pressure or heart rate.   TSH noted to be normal.  Cortisol level is borderline low.  However clinically low suspicion for adrenal insufficiency. Seen by PT and OT.  He is requiring a lot of assistance with mobility.  His family will be unable to take care of him at home.  Short-term rehab was recommended.  TOC consulted. Cardiology to arrange heart monitor.   A-fib, chronic Will remain only on metoprolol going forward.  Cardizem has been discontinued.  Cardiology to arrange outpatient monitor.   Noted to be tachycardic this morning.  Patient has been given his metoprolol.  Will see how his heart rate responds.  Now that he is off of Cardizem he may need a higher dose of metoprolol.   Right knee skin abrasion with  possible cellulitis Secondary to mechanical fall a week ago.   Continue doxycycline.   WBC noted to be higher today.  He is afebrile.  Knee looks about the same with perhaps slight improvement.  Able to move his knee without difficulty.  Recheck labs in the morning.   AKI on CKD stage IIIa Baseline creatinine 1.2.  Creatinine was 2.1 at admission.  Improved with hydration but then worsened in the last 48 hours.  Possibly due to poor oral intake.  He was gently hydrated with improvement in creatinine today.  Might have a higher baseline now.  Avoid nephrotoxic agents.   Hyperkalemia Received treatment in the ER, improved. Spironolactone has been discontinued.   Insulin-dependent type 2 diabetes A1c 7.5.  Continue home medications   Chronic HFpEF Appears to be euvolemic.  Lasix has been decreased.  Spironolactone discontinued.  Echocardiogram shows normal LVEF.   Hypertension Blood pressure is stable.   OSA Continue nightly CPAP.   History of hepatocellular carcinoma - Underwent micro ablation and transcatheter embolization about 5 years ago.  Currently under yearly surveillance   Obesity Estimated body mass index is 39.45 kg/m as calculated from the following:   Height as of this encounter: 5\' 11"  (1.803 m).   Weight as of this encounter: 128.3 kg.    DVT Prophylaxis: Paxipam Code Status: DNR Family Communication: Discussed with patient.  No family at bedside Disposition Plan: SNF for short-term rehab is being pursued     Medications: Scheduled:  allopurinol  100 mg Oral Daily   apixaban  5  mg Oral BID   doxycycline  100 mg Oral Q12H   ezetimibe  10 mg Oral Daily   FLUoxetine  10 mg Oral QPM   insulin aspart  0-15 Units Subcutaneous TID WC   insulin aspart  0-5 Units Subcutaneous QHS   melatonin  5 mg Oral QHS   metoprolol succinate  75 mg Oral Daily   pantoprazole  40 mg Oral Daily   pregabalin  100 mg Oral BID   Continuous:  sodium chloride 75 mL/hr at  09/07/23 0500   WJX:BJYNWGNFAOZHY **OR** acetaminophen, glucagon (human recombinant), guaiFENesin, hydrALAZINE, ipratropium-albuterol, metoprolol tartrate, ondansetron (ZOFRAN) IV, mouth rinse, senna-docusate, traMADol  Antibiotics: Anti-infectives (From admission, onward)    Start     Dose/Rate Route Frequency Ordered Stop   09/05/23 0000  doxycycline (VIBRA-TABS) 100 MG tablet        100 mg Oral Every 12 hours 09/05/23 1230 09/10/23 2359   09/04/23 1000  doxycycline (VIBRA-TABS) tablet 100 mg        100 mg Oral Every 12 hours 09/04/23 0503 09/11/23 0959       Objective:  Vital Signs  Vitals:   09/06/23 1920 09/06/23 1945 09/07/23 0418 09/07/23 0821  BP: (!) 124/96  130/86 118/77  Pulse: 98  90 77  Resp: 18 16 18 18   Temp: 98 F (36.7 C)  98.5 F (36.9 C) 98.2 F (36.8 C)  TempSrc: Oral  Oral Oral  SpO2: 95% 95% 95% 92%  Weight:      Height:        Intake/Output Summary (Last 24 hours) at 09/07/2023 1044 Last data filed at 09/07/2023 0500 Gross per 24 hour  Intake 1092.2 ml  Output 1950 ml  Net -857.8 ml   Filed Weights   09/03/23 2046 09/04/23 1700  Weight: 127.5 kg 128.3 kg    General appearance: Awake alert.  In no distress Resp: Clear to auscultation bilaterally.  Normal effort Cardio: S1-S2 is normal regular.  No S3-S4.  No rubs murmurs or bruit GI: Abdomen is soft.  Nontender nondistended.  Bowel sounds are present normal.  No masses organomegaly Extremities: Mild erythema noted over the right knee.  Abrasion is noted.  No active drainage.  Full range of motion of the right knee.   Lab Results:  Data Reviewed: I have personally reviewed following labs and reports of the imaging studies  CBC: Recent Labs  Lab 09/03/23 2103 09/04/23 0516 09/05/23 1131 09/07/23 0524  WBC 19.5* 18.6* 16.9* 19.1*  HGB 14.3 13.2 13.6 15.1  HCT 44.5 43.5 41.7 46.9  MCV 82.4 87.2 81.0 81.6  PLT 509* 442* 462* 488*    Basic Metabolic Panel: Recent Labs  Lab  09/03/23 2103 09/04/23 0516 09/05/23 0612 09/06/23 1006 09/07/23 0524  NA 135 137 136 132* 134*  K 6.3* 5.1 4.1 3.7 4.3  CL 99 103 102 100 101  CO2 23 21* 22 19* 23  GLUCOSE 184* 123* 145* 265* 217*  BUN 39* 35* 21 31* 30*  CREATININE 2.12* 1.77* 1.31* 1.65* 1.47*  CALCIUM 9.5 9.3 9.1 9.1 9.3  MG  --   --  1.8 1.9 1.9    GFR: Estimated Creatinine Clearance: 53.8 mL/min (A) (by C-G formula based on SCr of 1.47 mg/dL (H)).  Liver Function Tests: Recent Labs  Lab 09/03/23 2103  AST 31  ALT 20  ALKPHOS 91  BILITOT 0.4  PROT 7.0  ALBUMIN 3.1*     Coagulation Profile: Recent Labs  Lab 09/03/23 2103  INR 1.3*    CBG: Recent Labs  Lab 09/06/23 0913 09/06/23 1159 09/06/23 1627 09/06/23 2156 09/07/23 0816  GLUCAP 187* 180* 220* 176* 256*    Thyroid Function Tests: Recent Labs    09/04/23 1722  TSH 2.024     Recent Results (from the past 240 hours)  Resp panel by RT-PCR (RSV, Flu A&B, Covid) Anterior Nasal Swab     Status: None   Collection Time: 09/04/23  1:35 PM   Specimen: Anterior Nasal Swab  Result Value Ref Range Status   SARS Coronavirus 2 by RT PCR NEGATIVE NEGATIVE Final   Influenza A by PCR NEGATIVE NEGATIVE Final   Influenza B by PCR NEGATIVE NEGATIVE Final    Comment: (NOTE) The Xpert Xpress SARS-CoV-2/FLU/RSV plus assay is intended as an aid in the diagnosis of influenza from Nasopharyngeal swab specimens and should not be used as a sole basis for treatment. Nasal washings and aspirates are unacceptable for Xpert Xpress SARS-CoV-2/FLU/RSV testing.  Fact Sheet for Patients: BloggerCourse.com  Fact Sheet for Healthcare Providers: SeriousBroker.it  This test is not yet approved or cleared by the [place]  FDA and has been authorized for detection and/or diagnosis of SARS-CoV-2 by FDA under an Emergency Use Authorization (EUA). This EUA will remain in effect (meaning this test can be  used) for the duration of the COVID-19 declaration under Section 564(b)(1) of the Act, 21 U.S.C. section 360bbb-3(b)(1), unless the authorization is terminated or revoked.     Resp Syncytial Virus by PCR NEGATIVE NEGATIVE Final    Comment: (NOTE) Fact Sheet for Patients: BloggerCourse.com  Fact Sheet for Healthcare Providers: SeriousBroker.it  This test is not yet approved or cleared by the [place]  FDA and has been authorized for detection and/or diagnosis of SARS-CoV-2 by FDA under an Emergency Use Authorization (EUA). This EUA will remain in effect (meaning this test can be used) for the duration of the COVID-19 declaration under Section 564(b)(1) of the Act, 21 U.S.C. section 360bbb-3(b)(1), unless the authorization is terminated or revoked.  Performed at [facility], [address], [place], [place] [address]   Respiratory (~20 pathogens) panel by PCR     Status: None   Collection Time: 09/04/23  1:35 PM   Specimen: Anterior Nasal Swab; Respiratory  Result Value Ref Range Status   Adenovirus NOT DETECTED NOT DETECTED Final   Coronavirus 229E NOT DETECTED NOT DETECTED Final    Comment: (NOTE) The Coronavirus on the Respiratory Panel, DOES NOT test for the novel  Coronavirus (2019 nCoV)    Coronavirus HKU1 NOT DETECTED NOT DETECTED Final   Coronavirus NL63 NOT DETECTED NOT DETECTED Final   Coronavirus OC43 NOT DETECTED NOT DETECTED Final   Metapneumovirus NOT DETECTED NOT DETECTED Final   Rhinovirus / Enterovirus NOT DETECTED NOT DETECTED Final   Influenza A NOT DETECTED NOT DETECTED Final   Influenza B NOT DETECTED NOT DETECTED Final   Parainfluenza Virus 1 NOT DETECTED NOT DETECTED Final   Parainfluenza Virus 2 NOT DETECTED NOT DETECTED Final   Parainfluenza Virus 3 NOT DETECTED NOT DETECTED Final   Parainfluenza Virus 4 NOT DETECTED NOT DETECTED Final   Respiratory Syncytial Virus NOT DETECTED NOT  DETECTED Final   Bordetella pertussis NOT DETECTED NOT DETECTED Final   Bordetella Parapertussis NOT DETECTED NOT DETECTED Final   Chlamydophila pneumoniae NOT DETECTED NOT DETECTED Final   Mycoplasma pneumoniae NOT DETECTED NOT DETECTED Final    Comment: Performed at [facility], [address] [place],  [place] [address]      Radiology Studies: No results found.    LOS: 0 days   Destanee Bedonie [employer] on [email or web address]  09/07/2023, 10:44 AM

## 2023-09-07 NOTE — NC FL2 (Signed)
 [place] MEDICAID FL2 LEVEL OF CARE FORM     IDENTIFICATION  Patient Name: Steve Jensen Birthdate: 04/05/1941 Sex: male Admission Date (Current Location): 09/03/2023  [facility] and [place] Number:  Producer, television/film/video and Address:  [facility] [place], [address] [place], [place] [address]      Provider Number: [id number]  Attending Physician Name and Address:  Osvaldo Shipper, MD  Relative Name and Phone Number:  Meriam Sprague (daughter-n-law) Perlie Gold (Grandson)[phone]    Current Level of Care: Hospital Recommended Level of Care: Skilled Nursing Facility Prior Approval Number:    Date Approved/Denied:   PASRR Number: [id number]  Discharge Plan: SNF    Current Diagnoses: Patient Active Problem List   Diagnosis Date Noted   Syncope 09/04/2023   Cellulitis 09/04/2023   AKI (acute kidney injury) (HCC) 09/04/2023   Hyperkalemia 09/04/2023   Long term (current) use of anticoagulants 09/04/2023   Fall 09/04/2023   Chronic heart failure with preserved ejection fraction (HFpEF) (HCC) 09/04/2023   Acute on chronic diastolic (congestive) heart failure (HCC) 11/11/2022   Weakness of right lower extremity 11/11/2022   Hypokalemia 11/11/2022   Ileus (HCC)    Uncontrolled type 2 diabetes mellitus with hyperglycemia, without long-term current use of insulin (HCC) 12/17/2021   Osteoarthritis of right knee 12/17/2021   Community acquired pneumonia of left lower lobe of lung 12/16/2021   Sepsis due to CAP/PNA 12/16/2021   HCC (hepatocellular carcinoma) (HCC) 02/15/2018   Hepatocellular carcinoma (HCC) 12/30/2017   Pain in joint of left shoulder 11/09/2017   Hypertensive heart disease with heart failure (HCC) 03/23/2017   Chronic diastolic heart failure (HCC) 03/19/2015   Atrial fibrillation (HCC) 08/11/2013   S/P left knee arthroscopy 07/11/2013   Mixed hyperlipidemia 03/03/2013   Essential hypertension, benign 03/03/2013   Malignant neoplasm of prostate  (HCC) 03/03/2013   Osteoarthrosis, unspecified whether generalized or localized, other specified sites 03/03/2013   Allergic rhinitis 03/03/2013   Type 2 diabetes mellitus (HCC) 03/03/2013   Diabetic polyneuropathy (HCC) 03/03/2013   OSA (obstructive sleep apnea) 03/03/2013   Proteinuria 03/03/2013   Morbid obesity (HCC) 03/03/2013   Right knee pain 02/07/2013    Orientation RESPIRATION BLADDER Height & Weight     Self, Time, Situation, Place  Normal Incontinent, External catheter (External Urinary Catheter) Weight: 282 lb 13.6 oz (128.3 kg) Height:  5\' 11"  (180.3 cm)  BEHAVIORAL SYMPTOMS/MOOD NEUROLOGICAL BOWEL NUTRITION STATUS      Continent Diet (Please see discharge summary)  AMBULATORY STATUS COMMUNICATION OF NEEDS Skin   Limited Assist Verbally Other (Comment) (Abrasion,hand,leg,elbow,arm,Bil.,Erythema,abdomen,Foot,Groin,Bil.,Lower,Wound/Incision LDAs,Wound/Incision open or dehisced non-pressure wound,knee,anterior,R,L,Wound/Incision open or dehisced skin tear,arm,L,lower,Posterior,proximal)                       Personal Care Assistance Level of Assistance  Bathing, Feeding, Dressing Bathing Assistance: Maximum assistance Feeding assistance: Limited assistance Dressing Assistance: Maximum assistance     Functional Limitations Info  Sight, Hearing, Speech Sight Info: Impaired Financial trader) Hearing Info: Adequate Speech Info: Adequate    SPECIAL CARE FACTORS FREQUENCY  PT (By licensed PT), OT (By licensed OT)     PT Frequency: 5x min weekly OT Frequency: 5x min weekly            Contractures Contractures Info: Not present    Additional Factors Info  Code Status, Allergies, Insulin Sliding Scale, Psychotropic Code Status Info: DNR Allergies Info: Lipitor (atorvastatin),Zithromax (azithromycin),Zocor (simvastatin),Norvasc (amlodipine) Psychotropic Info: melatonin tablet 5 mg daily at bedtime,pregabalin (LYRICA)  capsule 100 mg 2 times daily,FLUoxetine  (PROZAC) capsule 10 mg every evening Insulin Sliding Scale Info: insulin aspart (novoLOG) injection 0-15 Units 3 times daily with meals,insulin aspart (novoLOG) injection 0-5 Units daily at bedtime       Current Medications (09/07/2023):  This is the current hospital active medication list Current Facility-Administered Medications  Medication Dose Route Frequency Provider Last Rate Last Admin   acetaminophen (TYLENOL) tablet 650 mg  650 mg Oral Q6H PRN John Giovanni, MD       Or   acetaminophen (TYLENOL) suppository 650 mg  650 mg Rectal Q6H PRN John Giovanni, MD       allopurinol (ZYLOPRIM) tablet 100 mg  100 mg Oral Daily Amin, Ankit C, MD   100 mg at 09/07/23 0809   apixaban (ELIQUIS) tablet 5 mg  5 mg Oral BID Amin, Ankit C, MD   5 mg at 09/07/23 0809   doxycycline (VIBRA-TABS) tablet 100 mg  100 mg Oral Q12H John Giovanni, MD   100 mg at 09/07/23 0809   ezetimibe (ZETIA) tablet 10 mg  10 mg Oral Daily Amin, Ankit C, MD   10 mg at 09/07/23 0810   FLUoxetine (PROZAC) capsule 10 mg  10 mg Oral QPM Amin, Ankit C, MD   10 mg at 09/06/23 1745   glucagon (human recombinant) (GLUCAGEN) injection 1 mg  1 mg Intravenous PRN Amin, Ankit C, MD       guaiFENesin (ROBITUSSIN) 100 MG/5ML liquid 5 mL  5 mL Oral Q4H PRN Amin, Ankit C, MD       hydrALAZINE (APRESOLINE) injection 10 mg  10 mg Intravenous Q4H PRN Amin, Ankit C, MD       insulin aspart (novoLOG) injection 0-15 Units  0-15 Units Subcutaneous TID WC John Giovanni, MD   3 Units at 09/07/23 1252   insulin aspart (novoLOG) injection 0-5 Units  0-5 Units Subcutaneous QHS John Giovanni, MD   2 Units at 09/04/23 2336   ipratropium-albuterol (DUONEB) 0.5-2.5 (3) MG/3ML nebulizer solution 3 mL  3 mL Nebulization Q4H PRN Amin, Ankit C, MD       melatonin tablet 5 mg  5 mg Oral QHS Amin, Ankit C, MD   5 mg at 09/06/23 2249   metoprolol succinate (TOPROL-XL) 24 hr tablet 75 mg  75 mg Oral Daily Tolia, Sunit, DO   75 mg at 09/07/23  0809   metoprolol tartrate (LOPRESSOR) injection 5 mg  5 mg Intravenous Q4H PRN Amin, Ankit C, MD       ondansetron (ZOFRAN) injection 4 mg  4 mg Intravenous Q6H PRN Amin, Ankit C, MD       Oral care mouth rinse  15 mL Mouth Rinse PRN Osvaldo Shipper, MD       pantoprazole (PROTONIX) EC tablet 40 mg  40 mg Oral Daily Amin, Ankit C, MD   40 mg at 09/07/23 0809   pregabalin (LYRICA) capsule 100 mg  100 mg Oral BID Amin, Ankit C, MD   100 mg at 09/07/23 0809   senna-docusate (Senokot-S) tablet 1 tablet  1 tablet Oral QHS PRN Amin, Ankit C, MD       traMADol (ULTRAM) tablet 50 mg  50 mg Oral Q6H PRN Osvaldo Shipper, MD         Discharge Medications: Please see discharge summary for a list of discharge medications.  Relevant Imaging Results:  Relevant Lab Results:   Additional Information [id number]  Delilah Shan, LCSWA

## 2023-09-07 NOTE — TOC Initial Note (Addendum)
 Transition of Care [facility]) - Initial/Assessment Note    Patient Details  Name: Steve Jensen MRN: [id number] Date of Birth: July 15, 1940  [facility]) CM/SW Contact:    Delilah Shan, LCSWA Phone Number: 09/07/2023, 2:42 PM  Clinical Narrative:                  CSW received consult for possible SNF placement at time of discharge. CSW spoke with patient at bedside regarding PT recommendation of SNF placement at time of discharge. Patient reports PTA he comes from home with Grandson,daughter-n-law, and [employer] children.Patient expressed understanding of PT recommendation and is agreeable to SNF placement at time of discharge. Patient reports preference for [facility] 1st choice and [facility] 2nd choice. CSW discussed insurance authorization process and will provide Medicare SNF ratings list with accepted SNF bed offers. Patient confirmed his daughter-n-law can bring cpap from home to facility.No further questions reported at this time. CSW to continue to follow and assist with discharge planning needs.   Update- CSW started insurance authorization for patient Auth ID# [id number].  CSW will add facility choice once decided by patient. CSW awaiting to hear back from [place] with [facility] to see if they can offer SNF bed for patient.   Expected Discharge Plan: Skilled Nursing Facility Barriers to Discharge: Continued Medical Work up   Patient Goals and CMS Choice Patient states their goals for this hospitalization and ongoing recovery are:: SNF   Choice offered to / list presented to : Patient      Expected Discharge Plan and Services In-house Referral: Clinical Social Work     Living arrangements for the past 2 months: Single Family Home                                      Prior Living Arrangements/Services Living arrangements for the past 2 months: Single Family Home Lives with:: Relatives (Ivor Costa and daughter n Social worker and great  grandchildren) Patient language and need for interpreter reviewed:: Yes Do you feel safe going back to the place where you live?: No   SNF  Need for Family Participation in Patient Care: Yes (Comment) Care giver support system in place?: Yes (comment)   Criminal Activity/Legal Involvement Pertinent to Current Situation/Hospitalization: No - Comment as needed  Activities of Daily Living   ADL Screening (condition at time of admission) Independently performs ADLs?: Yes (appropriate for developmental age) Is the patient deaf or have difficulty hearing?: Yes Does the patient have difficulty seeing, even when wearing glasses/contacts?: No Does the patient have difficulty concentrating, remembering, or making decisions?: No  Permission Sought/Granted Permission sought to share information with : Case Manager, Family Supports, Oceanographer granted to share information with : Yes, Verbal Permission Granted     Permission granted to share info w AGENCY: SNF        Emotional Assessment Appearance:: Appears stated age Attitude/Demeanor/Rapport: Gracious Affect (typically observed): Calm Orientation: : Oriented to Self, Oriented to Place, Oriented to  Time, Oriented to Situation Alcohol / Substance Use: Not Applicable Psych Involvement: No (comment)  Admission diagnosis:  Hyperkalemia [E87.5] Syncope [R55] Renal dysfunction [N28.9] Injury of head, initial encounter [S09.90XA] Fall, initial encounter [W19.XXXA] Atrial fibrillation [facility]) [I48.91] Patient Active Problem List   Diagnosis Date Noted   Syncope 09/04/2023   Cellulitis 09/04/2023   AKI (acute kidney injury) (HCC) 09/04/2023   Hyperkalemia 09/04/2023  Long term (current) use of anticoagulants 09/04/2023   Fall 09/04/2023   Chronic heart failure with preserved ejection fraction (HFpEF) (HCC) 09/04/2023   Acute on chronic diastolic (congestive) heart failure (HCC) 11/11/2022   Weakness of right  lower extremity 11/11/2022   Hypokalemia 11/11/2022   Ileus (HCC)    Uncontrolled type 2 diabetes mellitus with hyperglycemia, without long-term current use of insulin (HCC) 12/17/2021   Osteoarthritis of right knee 12/17/2021   Community acquired pneumonia of left lower lobe of lung 12/16/2021   Sepsis due to CAP/PNA 12/16/2021   HCC (hepatocellular carcinoma) (HCC) 02/15/2018   Hepatocellular carcinoma (HCC) 12/30/2017   Pain in joint of left shoulder 11/09/2017   Hypertensive heart disease with heart failure (HCC) 03/23/2017   Chronic diastolic heart failure (HCC) 03/19/2015   Atrial fibrillation (HCC) 08/11/2013   S/P left knee arthroscopy 07/11/2013   Mixed hyperlipidemia 03/03/2013   Essential hypertension, benign 03/03/2013   Malignant neoplasm of prostate (HCC) 03/03/2013   Osteoarthrosis, unspecified whether generalized or localized, other specified sites 03/03/2013   Allergic rhinitis 03/03/2013   Type 2 diabetes mellitus (HCC) 03/03/2013   Diabetic polyneuropathy (HCC) 03/03/2013   OSA (obstructive sleep apnea) 03/03/2013   Proteinuria 03/03/2013   Morbid obesity (HCC) 03/03/2013   Right knee pain 02/07/2013   PCP:  Glena Landau, MD Pharmacy:   [facility] [phone] - [place], [place] - [address] [place] [address] Phone: [phone] Fax: [phone]     Social Drivers of Health (SDOH) Social History: SDOH Screenings   Food Insecurity: No Food Insecurity (09/04/2023)  Housing: Low Risk  (09/04/2023)  Transportation Needs: No Transportation Needs (09/04/2023)  Utilities: Not At Risk (09/04/2023)  Social Connections: Socially Isolated (09/04/2023)  Tobacco Use: Medium Risk (09/04/2023)   SDOH Interventions:     Readmission Risk Interventions    12/26/2021   12:51 PM 12/25/2021    3:31 PM 12/19/2021   12:39 PM  Readmission Risk Prevention Plan  Transportation Screening  Complete Complete  PCP or  Specialist Appt within 5-7 Days Complete    Home Care Screening  Complete   Medication Review (RN CM)  Complete   HRI or Home Care Consult   Complete  Social Work Consult for Recovery Care Planning/Counseling   Complete  Palliative Care Screening   Not Applicable  Medication Review Oceanographer)   Complete

## 2023-09-08 DIAGNOSIS — G4700 Insomnia, unspecified: Secondary | ICD-10-CM | POA: Diagnosis not present

## 2023-09-08 DIAGNOSIS — R140 Abdominal distension (gaseous): Secondary | ICD-10-CM | POA: Diagnosis not present

## 2023-09-08 DIAGNOSIS — E119 Type 2 diabetes mellitus without complications: Secondary | ICD-10-CM | POA: Diagnosis not present

## 2023-09-08 DIAGNOSIS — R404 Transient alteration of awareness: Secondary | ICD-10-CM | POA: Diagnosis not present

## 2023-09-08 DIAGNOSIS — N1831 Chronic kidney disease, stage 3a: Secondary | ICD-10-CM | POA: Diagnosis not present

## 2023-09-08 DIAGNOSIS — G4733 Obstructive sleep apnea (adult) (pediatric): Secondary | ICD-10-CM | POA: Diagnosis not present

## 2023-09-08 DIAGNOSIS — R5381 Other malaise: Secondary | ICD-10-CM | POA: Diagnosis not present

## 2023-09-08 DIAGNOSIS — R0602 Shortness of breath: Secondary | ICD-10-CM | POA: Diagnosis not present

## 2023-09-08 DIAGNOSIS — I4891 Unspecified atrial fibrillation: Secondary | ICD-10-CM | POA: Diagnosis not present

## 2023-09-08 DIAGNOSIS — Z7401 Bed confinement status: Secondary | ICD-10-CM | POA: Diagnosis not present

## 2023-09-08 DIAGNOSIS — R601 Generalized edema: Secondary | ICD-10-CM | POA: Diagnosis not present

## 2023-09-08 DIAGNOSIS — S81801A Unspecified open wound, right lower leg, initial encounter: Secondary | ICD-10-CM | POA: Diagnosis not present

## 2023-09-08 DIAGNOSIS — R2681 Unsteadiness on feet: Secondary | ICD-10-CM | POA: Diagnosis not present

## 2023-09-08 DIAGNOSIS — I5030 Unspecified diastolic (congestive) heart failure: Secondary | ICD-10-CM | POA: Diagnosis not present

## 2023-09-08 DIAGNOSIS — N189 Chronic kidney disease, unspecified: Secondary | ICD-10-CM | POA: Diagnosis not present

## 2023-09-08 DIAGNOSIS — D649 Anemia, unspecified: Secondary | ICD-10-CM | POA: Diagnosis not present

## 2023-09-08 DIAGNOSIS — Z743 Need for continuous supervision: Secondary | ICD-10-CM | POA: Diagnosis not present

## 2023-09-08 DIAGNOSIS — C220 Liver cell carcinoma: Secondary | ICD-10-CM | POA: Diagnosis not present

## 2023-09-08 DIAGNOSIS — K560 Paralytic ileus: Secondary | ICD-10-CM | POA: Diagnosis not present

## 2023-09-08 DIAGNOSIS — K219 Gastro-esophageal reflux disease without esophagitis: Secondary | ICD-10-CM | POA: Diagnosis not present

## 2023-09-08 DIAGNOSIS — K5900 Constipation, unspecified: Secondary | ICD-10-CM | POA: Diagnosis not present

## 2023-09-08 DIAGNOSIS — Z741 Need for assistance with personal care: Secondary | ICD-10-CM | POA: Diagnosis not present

## 2023-09-08 DIAGNOSIS — Z9181 History of falling: Secondary | ICD-10-CM | POA: Diagnosis not present

## 2023-09-08 DIAGNOSIS — S81002A Unspecified open wound, left knee, initial encounter: Secondary | ICD-10-CM | POA: Diagnosis not present

## 2023-09-08 DIAGNOSIS — E1140 Type 2 diabetes mellitus with diabetic neuropathy, unspecified: Secondary | ICD-10-CM | POA: Diagnosis not present

## 2023-09-08 DIAGNOSIS — I10 Essential (primary) hypertension: Secondary | ICD-10-CM | POA: Diagnosis not present

## 2023-09-08 DIAGNOSIS — L0390 Cellulitis, unspecified: Secondary | ICD-10-CM | POA: Diagnosis not present

## 2023-09-08 DIAGNOSIS — E875 Hyperkalemia: Secondary | ICD-10-CM | POA: Diagnosis not present

## 2023-09-08 DIAGNOSIS — W19XXXD Unspecified fall, subsequent encounter: Secondary | ICD-10-CM | POA: Diagnosis not present

## 2023-09-08 DIAGNOSIS — R197 Diarrhea, unspecified: Secondary | ICD-10-CM | POA: Diagnosis not present

## 2023-09-08 DIAGNOSIS — I509 Heart failure, unspecified: Secondary | ICD-10-CM | POA: Diagnosis not present

## 2023-09-08 DIAGNOSIS — E876 Hypokalemia: Secondary | ICD-10-CM | POA: Diagnosis not present

## 2023-09-08 DIAGNOSIS — I130 Hypertensive heart and chronic kidney disease with heart failure and stage 1 through stage 4 chronic kidney disease, or unspecified chronic kidney disease: Secondary | ICD-10-CM | POA: Diagnosis not present

## 2023-09-08 DIAGNOSIS — R109 Unspecified abdominal pain: Secondary | ICD-10-CM | POA: Diagnosis not present

## 2023-09-08 DIAGNOSIS — M6281 Muscle weakness (generalized): Secondary | ICD-10-CM | POA: Diagnosis not present

## 2023-09-08 DIAGNOSIS — N179 Acute kidney failure, unspecified: Secondary | ICD-10-CM | POA: Diagnosis not present

## 2023-09-08 DIAGNOSIS — R55 Syncope and collapse: Secondary | ICD-10-CM | POA: Diagnosis not present

## 2023-09-08 DIAGNOSIS — K567 Ileus, unspecified: Secondary | ICD-10-CM | POA: Diagnosis not present

## 2023-09-08 DIAGNOSIS — E782 Mixed hyperlipidemia: Secondary | ICD-10-CM | POA: Diagnosis not present

## 2023-09-08 DIAGNOSIS — I5032 Chronic diastolic (congestive) heart failure: Secondary | ICD-10-CM | POA: Diagnosis not present

## 2023-09-08 DIAGNOSIS — I482 Chronic atrial fibrillation, unspecified: Secondary | ICD-10-CM | POA: Diagnosis not present

## 2023-09-08 DIAGNOSIS — R1314 Dysphagia, pharyngoesophageal phase: Secondary | ICD-10-CM | POA: Diagnosis not present

## 2023-09-08 DIAGNOSIS — J309 Allergic rhinitis, unspecified: Secondary | ICD-10-CM | POA: Diagnosis not present

## 2023-09-08 DIAGNOSIS — R278 Other lack of coordination: Secondary | ICD-10-CM | POA: Diagnosis not present

## 2023-09-08 DIAGNOSIS — I5033 Acute on chronic diastolic (congestive) heart failure: Secondary | ICD-10-CM | POA: Diagnosis not present

## 2023-09-08 DIAGNOSIS — E559 Vitamin D deficiency, unspecified: Secondary | ICD-10-CM | POA: Diagnosis not present

## 2023-09-08 DIAGNOSIS — K5909 Other constipation: Secondary | ICD-10-CM | POA: Diagnosis not present

## 2023-09-08 DIAGNOSIS — M109 Gout, unspecified: Secondary | ICD-10-CM | POA: Diagnosis not present

## 2023-09-08 DIAGNOSIS — E785 Hyperlipidemia, unspecified: Secondary | ICD-10-CM | POA: Diagnosis not present

## 2023-09-08 LAB — BASIC METABOLIC PANEL WITH GFR
Anion gap: 10 (ref 5–15)
BUN: 27 mg/dL — ABNORMAL HIGH (ref 8–23)
CO2: 22 mmol/L (ref 22–32)
Calcium: 9.1 mg/dL (ref 8.9–10.3)
Chloride: 106 mmol/L (ref 98–111)
Creatinine, Ser: 1.35 mg/dL — ABNORMAL HIGH (ref 0.61–1.24)
GFR, Estimated: 53 mL/min — ABNORMAL LOW (ref >60)
Glucose, Bld: 155 mg/dL — ABNORMAL HIGH (ref 70–99)
Potassium: 4.2 mmol/L (ref 3.5–5.1)
Sodium: 138 mmol/L (ref 135–145)

## 2023-09-08 LAB — CBC
HCT: 41.4 % (ref 39.0–52.0)
Hemoglobin: 13.7 g/dL (ref 13.0–17.0)
MCH: 26.6 pg (ref 26.0–34.0)
MCHC: 33.1 g/dL (ref 30.0–36.0)
MCV: 80.2 fL (ref 80.0–100.0)
Platelets: 438 10*3/uL — ABNORMAL HIGH (ref 150–400)
RBC: 5.16 MIL/uL (ref 4.22–5.81)
RDW: 17.3 % — ABNORMAL HIGH (ref 11.5–15.5)
WBC: 17.7 10*3/uL — ABNORMAL HIGH (ref 4.0–10.5)
nRBC: 0 % (ref 0.0–0.2)

## 2023-09-08 LAB — GLUCOSE, CAPILLARY
Glucose-Capillary: 220 mg/dL — ABNORMAL HIGH (ref 70–99)
Glucose-Capillary: 180 mg/dL — ABNORMAL HIGH (ref 70–99)

## 2023-09-08 LAB — MAGNESIUM: Magnesium: 1.8 mg/dL (ref 1.7–2.4)

## 2023-09-08 MED ORDER — PREGABALIN 100 MG PO CAPS: 100.0000 mg | ORAL_CAPSULE | Freq: Two times a day (BID) | ORAL | 0 refills | Status: DC

## 2023-09-08 MED ORDER — DOXYCYCLINE HYCLATE 100 MG PO TABS: 100.0000 mg | ORAL_TABLET | Freq: Two times a day (BID) | ORAL | Status: AC

## 2023-09-08 MED ORDER — METOPROLOL SUCCINATE ER 100 MG PO TB24
100.0000 mg | ORAL_TABLET | Freq: Every day | ORAL | Status: DC
  Administered 2023-09-08: 100 mg via ORAL
  Filled 2023-09-08: qty 1

## 2023-09-08 MED ORDER — FUROSEMIDE 20 MG PO TABS: 20.0000 mg | ORAL_TABLET | Freq: Every day | ORAL | Status: DC

## 2023-09-08 MED ORDER — METOPROLOL SUCCINATE ER 100 MG PO TB24: 100.0000 mg | ORAL_TABLET | Freq: Every day | ORAL | Status: DC

## 2023-09-08 NOTE — Discharge Summary (Addendum)
 [facility]  Physician Discharge Summary   Patient ID: Steve Jensen MRN: [id number] DOB/AGE: 08-18-1940 83 y.o.  Admit date: 09/03/2023 Discharge date:   09/08/2023   PCP: Lupita Raider, MD  DISCHARGE DIAGNOSES:  Near Syncope   Type 2 diabetes mellitus (HCC)   OSA (obstructive sleep apnea)   Atrial fibrillation (HCC)   Cellulitis   AKI (acute kidney injury) (HCC)   Hyperkalemia   Long term (current) use of anticoagulants   Chronic heart failure with preserved ejection fraction (HFpEF) (HCC)   RECOMMENDATIONS FOR OUTPATIENT FOLLOW UP: Cardiology to schedule outpatient follow-up. Please check CBC and basic metabolic panel in 3 days to check renal function and hemoglobin. Resume furosemide at a lower dose if renal function is stable.    Home Health: SNF  Equipment/Devices: None  CODE STATUS: DNR  DISCHARGE CONDITION: fair  Diet recommendation: Heart healthy  INITIAL HISTORY: 83 year old with history of A-fib on Eliquis, HTN, chronic CHF with preserved EF, OSA on CPAP, DM2, obesity, CKD 3A, hepatocellular carcinoma, GERD, prostate cancer comes to the ED after a fall and struck the back of the head. Upon admission trauma workup including CT head and cervical spine were negative. Was found to have AKI and hyperkalemia. Patient was given Lokelma, IV calcium gluconate, insulin and D50, nebulizer.    Consultations: Cardiology   Procedures: Echocardiogram EEG Carotid Doppler  HOSPITAL COURSE:   Fall at home with concern for near syncope Patient not entirely certain if he actually passed out or not. Patient was seen by cardiology. Underwent echocardiogram which showed LVEF of 55 to 60%.  No significant valvular abnormalities noted.  Carotid Doppler unremarkable.  EEG was unremarkable.   Orthostatics did not show any significant changes blood pressure or heart rate.   TSH noted to be normal.  Cortisol level is borderline low.  However clinically low  suspicion for adrenal insufficiency. Seen by PT and OT.  He is requiring a lot of assistance with mobility.  His family will be unable to take care of him at home.  Short-term rehab was recommended.  TOC consulted. Cardiology to arrange heart monitor.   A-fib, chronic Will remain only on metoprolol going forward.  Cardizem has been discontinued.  Cardiology to arrange outpatient monitor.   Dose of metoprolol had to be increased for better heart rate control. Cardizem remains on hold.   Right knee skin abrasion with possible cellulitis Secondary to mechanical fall a week ago.   WBC has fluctuated some but he is afebrile.  WBC is better today.  Can be rechecked in a few days.   Continue doxycycline.     AKI on CKD stage IIIa Baseline creatinine 1.2.  Creatinine was 2.1 at admission.  Improved with hydration but then worsened in the last 48 hours.  Possibly due to poor oral intake.  He was gently hydrated with improvement in creatinine.  Might have a higher baseline now.  Avoid nephrotoxic agents. Recheck labs in a few days at [facility].   Hyperkalemia Received treatment in the ER, improved. Spironolactone has been discontinued.   Insulin-dependent type 2 diabetes A1c 7.5.  Continue home medications   Chronic HFpEF Appears to be euvolemic.  Lasix has been decreased.  Spironolactone discontinued.  Echocardiogram shows normal LVEF.   Hypertension Blood pressure is stable.   OSA Continue nightly CPAP.   History of hepatocellular carcinoma - Underwent micro ablation and transcatheter embolization about 5 years ago.  Currently under yearly surveillance   Obesity Estimated body mass index  is 39.45 kg/m as calculated from the following:   Height as of this encounter: 5\' 11"  (1.803 m).   Weight as of this encounter: 128.3 kg.    Patient is stable.  Okay for discharge to SNF when bed is available.   PERTINENT LABS:  The results of significant diagnostics from this hospitalization  (including imaging, microbiology, ancillary and laboratory) are listed below for reference.    Microbiology: Recent Results (from the past 240 hours)  Resp panel by RT-PCR (RSV, Flu A&B, Covid) Anterior Nasal Swab     Status: None   Collection Time: 09/04/23  1:35 PM   Specimen: Anterior Nasal Swab  Result Value Ref Range Status   SARS Coronavirus 2 by RT PCR NEGATIVE NEGATIVE Final   Influenza A by PCR NEGATIVE NEGATIVE Final   Influenza B by PCR NEGATIVE NEGATIVE Final    Comment: (NOTE) The Xpert Xpress SARS-CoV-2/FLU/RSV plus assay is intended as an aid in the diagnosis of influenza from Nasopharyngeal swab specimens and should not be used as a sole basis for treatment. Nasal washings and aspirates are unacceptable for Xpert Xpress SARS-CoV-2/FLU/RSV testing.  Fact Sheet for Patients: BloggerCourse.com  Fact Sheet for Healthcare Providers: SeriousBroker.it  This test is not yet approved or cleared by the [place]  FDA and has been authorized for detection and/or diagnosis of SARS-CoV-2 by FDA under an Emergency Use Authorization (EUA). This EUA will remain in effect (meaning this test can be used) for the duration of the COVID-19 declaration under Section 564(b)(1) of the Act, 21 U.S.C. section 360bbb-3(b)(1), unless the authorization is terminated or revoked.     Resp Syncytial Virus by PCR NEGATIVE NEGATIVE Final    Comment: (NOTE) Fact Sheet for Patients: BloggerCourse.com  Fact Sheet for Healthcare Providers: SeriousBroker.it  This test is not yet approved or cleared by the [place]  FDA and has been authorized for detection and/or diagnosis of SARS-CoV-2 by FDA under an Emergency Use Authorization (EUA). This EUA will remain in effect (meaning this test can be used) for the duration of the COVID-19 declaration under Section 564(b)(1) of the Act, 21  U.S.C. section 360bbb-3(b)(1), unless the authorization is terminated or revoked.  Performed at [facility], [address], [place], [place] [address]   Respiratory (~20 pathogens) panel by PCR     Status: None   Collection Time: 09/04/23  1:35 PM   Specimen: Anterior Nasal Swab; Respiratory  Result Value Ref Range Status   Adenovirus NOT DETECTED NOT DETECTED Final   Coronavirus 229E NOT DETECTED NOT DETECTED Final    Comment: (NOTE) The Coronavirus on the Respiratory Panel, DOES NOT test for the novel  Coronavirus (2019 nCoV)    Coronavirus HKU1 NOT DETECTED NOT DETECTED Final   Coronavirus NL63 NOT DETECTED NOT DETECTED Final   Coronavirus OC43 NOT DETECTED NOT DETECTED Final   Metapneumovirus NOT DETECTED NOT DETECTED Final   Rhinovirus / Enterovirus NOT DETECTED NOT DETECTED Final   Influenza A NOT DETECTED NOT DETECTED Final   Influenza B NOT DETECTED NOT DETECTED Final   Parainfluenza Virus 1 NOT DETECTED NOT DETECTED Final   Parainfluenza Virus 2 NOT DETECTED NOT DETECTED Final   Parainfluenza Virus 3 NOT DETECTED NOT DETECTED Final   Parainfluenza Virus 4 NOT DETECTED NOT DETECTED Final   Respiratory Syncytial Virus NOT DETECTED NOT DETECTED Final   Bordetella pertussis NOT DETECTED NOT DETECTED Final   Bordetella Parapertussis NOT DETECTED NOT DETECTED Final   Chlamydophila pneumoniae NOT DETECTED NOT DETECTED  Final   Mycoplasma pneumoniae NOT DETECTED NOT DETECTED Final    Comment: Performed at [facility], [address], [place], [place] [address]     Labs:   Basic Metabolic Panel: Recent Labs  Lab 09/04/23 0516 09/05/23 0612 09/06/23 1006 09/07/23 0524 09/08/23 0529  NA 137 136 132* 134* 138  K 5.1 4.1 3.7 4.3 4.2  CL 103 102 100 101 106  CO2 21* 22 19* 23 22  GLUCOSE 123* 145* 265* 217* 155*  BUN 35* 21 31* 30* 27*  CREATININE 1.77* 1.31* 1.65* 1.47* 1.35*  CALCIUM 9.3 9.1 9.1 9.3 9.1  MG  --  1.8 1.9 1.9 1.8   Liver Function  Tests: Recent Labs  Lab 09/03/23 2103  AST 31  ALT 20  ALKPHOS 91  BILITOT 0.4  PROT 7.0  ALBUMIN 3.1*    CBC: Recent Labs  Lab 09/03/23 2103 09/04/23 0516 09/05/23 1131 09/07/23 0524 09/08/23 0529  WBC 19.5* 18.6* 16.9* 19.1* 17.7*  HGB 14.3 13.2 13.6 15.1 13.7  HCT 44.5 43.5 41.7 46.9 41.4  MCV 82.4 87.2 81.0 81.6 80.2  PLT 509* 442* 462* 488* 438*    BNP: BNP (last 3 results) Recent Labs    11/11/22 1210 09/03/23 2103  BNP 457.0* 152.3*    CBG: Recent Labs  Lab 09/07/23 0816 09/07/23 1150 09/07/23 1642 09/07/23 2056 09/08/23 0758  GLUCAP 256* 182* 203* 205* 220*     IMAGING STUDIES VAS US CAROTID Result Date: 09/04/2023 Carotid Arterial Duplex Study Patient Name:  REGNALD BOWENS [facility]  Date of Exam:   09/04/2023 Medical Rec #: [id number]       Accession #:    [id number] Date of Birth: 09/17/1940      Patient Gender: M Patient Age:   8 years Exam Location:  [facility] Procedure:      VAS US CAROTID Referring Phys: Ulyess Blossom RATHORE --------------------------------------------------------------------------------  Indications:       Syncope. Risk Factors:      Hypertension, hyperlipidemia, Diabetes. Other Factors:     A-fib - on Eliquis. Comparison Study:  No priors. Performing Technologist: Millbrook Sink Sturdivant-Jones RDMS, RVT  Examination Guidelines: A complete evaluation includes B-mode imaging, spectral Doppler, color Doppler, and power Doppler as needed of all accessible portions of each vessel. Bilateral testing is considered an integral part of a complete examination. Limited examinations for reoccurring indications may be performed as noted.  Right Carotid Findings: +----------+--------+--------+--------+------------------+--------+           PSV cm/sEDV cm/sStenosisPlaque DescriptionComments +----------+--------+--------+--------+------------------+--------+ CCA Prox  85      14                                          +----------+--------+--------+--------+------------------+--------+ CCA Distal75      14                                         +----------+--------+--------+--------+------------------+--------+ ICA Prox  73      19      1-39%   calcific                   +----------+--------+--------+--------+------------------+--------+ ICA Distal85      22                                         +----------+--------+--------+--------+------------------+--------+  ECA       112     15                                         +----------+--------+--------+--------+------------------+--------+ +----------+--------+-------+----------------+-------------------+           PSV cm/sEDV cmsDescribe        Arm Pressure (mmHG) +----------+--------+-------+----------------+-------------------+ Subclavian100            Multiphasic, WNL                    +----------+--------+-------+----------------+-------------------+ +---------+--------+--+--------+--+---------+ VertebralPSV cm/s52EDV cm/s16Antegrade +---------+--------+--+--------+--+---------+  Left Carotid Findings: +----------+--------+--------+--------+------------------+------------------+           PSV cm/sEDV cm/sStenosisPlaque DescriptionComments           +----------+--------+--------+--------+------------------+------------------+ CCA Prox  67      17                                                   +----------+--------+--------+--------+------------------+------------------+ CCA Distal126     22                                                   +----------+--------+--------+--------+------------------+------------------+ ICA Prox  66      17      1-39%                     intimal thickening +----------+--------+--------+--------+------------------+------------------+ ICA Distal79      28                                                    +----------+--------+--------+--------+------------------+------------------+ ECA       176     16                                                   +----------+--------+--------+--------+------------------+------------------+ +----------+--------+--------+----------------+-------------------+           PSV cm/sEDV cm/sDescribe        Arm Pressure (mmHG) +----------+--------+--------+----------------+-------------------+ [id number]              Multiphasic, WNL                    +----------+--------+--------+----------------+-------------------+ +---------+--------+--+--------+--+---------+ VertebralPSV cm/s38EDV cm/s12Antegrade +---------+--------+--+--------+--+---------+   Summary: Right Carotid: Velocities in the right ICA are consistent with a 1-39% stenosis. Left Carotid: Velocities in the left ICA are consistent with a 1-39% stenosis. Vertebrals:  Bilateral vertebral arteries demonstrate antegrade flow. Subclavians: Normal flow hemodynamics were seen in bilateral subclavian              arteries. *See table(s) above for measurements and observations.  Electronically signed by Irvin Mantel on 09/04/2023 at 9:02:31 PM.    Final    EEG adult Result Date: 09/04/2023 Arleene Lack, MD     09/04/2023  5:54 PM Patient Name:  TAVARES LEVINSON MRN: [id number] Epilepsy Attending: Charlsie Quest Referring Physician/Provider: John Giovanni, MD Date: 09/04/2023 Duration: 24.26 mins Patient history: 83yo M with syncope. EEG to evaluate for seizure Level of alertness: Awake AEDs during EEG study: PGB Technical aspects: This EEG study was done with scalp electrodes positioned according to the 10-20 International system of electrode placement. Electrical activity was reviewed with band pass filter of 1-70Hz , sensitivity of 7 uV/mm, display speed of 24mm/sec with a 60Hz  notched filter applied as appropriate. EEG data were recorded continuously and digitally stored.  Video monitoring was  available and reviewed as appropriate. Description: The posterior dominant rhythm consists of 8-9 Hz activity of moderate voltage (25-35 uV) seen predominantly in posterior head regions, symmetric and reactive to eye opening and eye closing. EEG showed intermittent generalized 3 to 6 Hz theta-delta slowing. Physiologic photic driving was not seen during photic stimulation.  Hyperventilation was not performed.   ABNORMALITY - Intermittent slow, generalized IMPRESSION: This study is suggestive of mild diffuse encephalopathy. No seizures or epileptiform discharges were seen throughout the recording. Charlsie Quest   ECHOCARDIOGRAM COMPLETE Result Date: 09/04/2023    ECHOCARDIOGRAM REPORT   Patient Name:   OZZY BOHLKEN Date of Exam: 09/04/2023 Medical Rec #:  [id number]      Height:       70.0 in Accession #:    [id number]     Weight:       281.0 lb Date of Birth:  1941-04-23     BSA:          2.412 m Patient Age:    82 years       BP:           107/66 mmHg Patient Gender: M              HR:           95 bpm. Exam Location:  Inpatient Procedure: 2D Echo, Cardiac Doppler, Color Doppler and Intracardiac            Opacification Agent (Both Spectral and Color Flow Doppler were            utilized during procedure). Indications:    Syncope  History:        Patient has prior history of Echocardiogram examinations, most                 recent 11/12/2022. Arrythmias:Atrial Fibrillation; Risk                 Factors:Sleep Apnea, Dyslipidemia, Diabetes and Hypertension.  Sonographer:    Melton Krebs RDCS, FE, PE Referring Phys: [id number] VASUNDHRA RATHORE IMPRESSIONS  1. Left ventricular ejection fraction, by estimation, is 55 to 60%. The left ventricle has normal function. The left ventricle has no regional wall motion abnormalities. Left ventricular diastolic function could not be evaluated.  2. Right ventricular systolic function is mildly reduced. The right ventricular size is mildly enlarged. Tricuspid regurgitation  signal is inadequate for assessing PA pressure.  3. Left atrial size was mildly dilated.  4. The mitral valve is normal in structure. Trivial mitral valve regurgitation. No evidence of mitral stenosis.  5. The aortic valve is calcified. There is moderate calcification of the aortic valve. There is mild thickening of the aortic valve. Aortic valve regurgitation is mild. Aortic valve sclerosis/calcification is present, without any evidence of aortic stenosis.  6. The inferior vena cava is normal in size with greater than 50% respiratory variability, suggesting right atrial pressure  of 3 mmHg. FINDINGS  Left Ventricle: Left ventricular ejection fraction, by estimation, is 55 to 60%. The left ventricle has normal function. The left ventricle has no regional wall motion abnormalities. Definity contrast agent was given IV to delineate the left ventricular  endocardial borders. The left ventricular internal cavity size was normal in size. There is no left ventricular hypertrophy. Left ventricular diastolic function could not be evaluated due to atrial fibrillation. Left ventricular diastolic function could  not be evaluated. Right Ventricle: Poorly visualized but does appear to have at least mildly reduced function. The right ventricular size is mildly enlarged. No increase in right ventricular wall thickness. Right ventricular systolic function is mildly reduced. Tricuspid regurgitation signal is inadequate for assessing PA pressure. Left Atrium: Left atrial size was mildly dilated. Right Atrium: Right atrial size was normal in size. Pericardium: There is no evidence of pericardial effusion. Mitral Valve: The mitral valve is normal in structure. Trivial mitral valve regurgitation. No evidence of mitral valve stenosis. Tricuspid Valve: The tricuspid valve is normal in structure. Tricuspid valve regurgitation is mild . No evidence of tricuspid stenosis. Aortic Valve: The aortic valve is calcified. There is moderate  calcification of the aortic valve. There is mild thickening of the aortic valve. Aortic valve regurgitation is mild. Aortic valve sclerosis/calcification is present, without any evidence of aortic stenosis. Aortic valve mean gradient measures 3.0 mmHg. Aortic valve peak gradient measures 5.1 mmHg. Aortic valve area, by VTI measures 3.75 cm. Pulmonic Valve: The pulmonic valve was normal in structure. Pulmonic valve regurgitation is not visualized. No evidence of pulmonic stenosis. Aorta: The aortic root and ascending aorta are structurally normal, with no evidence of dilitation. Venous: The inferior vena cava is normal in size with greater than 50% respiratory variability, suggesting right atrial pressure of 3 mmHg. IAS/Shunts: No atrial level shunt detected by color flow Doppler.  LEFT VENTRICLE PLAX 2D LVIDd:         4.45 cm   Diastology LVIDs:         2.90 cm   LV e' medial:    11.20 cm/s LV PW:         1.00 cm   LV E/e' medial:  10.3 LV IVS:        1.00 cm   LV e' lateral:   12.00 cm/s LVOT diam:     2.50 cm   LV E/e' lateral: 9.6 LV SV:         83 LV SV Index:   34 LVOT Area:     4.91 cm  LEFT ATRIUM             Index LA Vol (A2C):   68.9 ml 28.57 ml/m LA Vol (A4C):   87.4 ml 36.24 ml/m LA Biplane Vol: 79.9 ml 33.13 ml/m  AORTIC VALVE AV Area (Vmax):    4.03 cm AV Area (Vmean):   3.72 cm AV Area (VTI):     3.75 cm AV Vmax:           113.00 cm/s AV Vmean:          79.000 cm/s AV VTI:            0.221 m AV Peak Grad:      5.1 mmHg AV Mean Grad:      3.0 mmHg LVOT Vmax:         92.70 cm/s LVOT Vmean:        59.800 cm/s LVOT VTI:  0.169 m LVOT/AV VTI ratio: 0.76  AORTA Ao Root diam: 3.60 cm Ao Asc diam:  3.60 cm MITRAL VALVE                TRICUSPID VALVE MV Area (PHT): 4.68 cm     TR Peak grad:   12.4 mmHg MV Decel Time: 162 msec     TR Vmax:        176.00 cm/s MV E velocity: 115.00 cm/s MV A velocity: 55.90 cm/s   SHUNTS MV E/A ratio:  2.06         Systemic VTI:  0.17 m                              Systemic Diam: 2.50 cm Clearnce Hasten Electronically signed by Clearnce Hasten Signature Date/Time: 09/04/2023/10:45:13 AM    Final    DG Chest Port 1 View Result Date: 09/03/2023 CLINICAL DATA:  Trauma EXAM: PORTABLE CHEST 1 VIEW COMPARISON:  Chest x-ray 11/13/2022 FINDINGS: The heart and mediastinal contours are unchanged. Atherosclerotic plaque. No focal consolidation. No pulmonary edema. No pleural effusion. No pneumothorax. No acute osseous abnormality. IMPRESSION: 1. No active disease. 2.  Aortic Atherosclerosis (ICD10-I70.0). Electronically Signed   By: Tish Frederickson M.D.   On: 09/03/2023 22:43   DG Pelvis Portable Result Date: 09/03/2023 CLINICAL DATA:  Trauma EXAM: PORTABLE PELVIS 1-2 VIEWS COMPARISON:  X-ray pelvis 12/23/2021 FINDINGS: Right femoral trochanter collimated off view. No acute displaced fracture or dislocation of the hips. There is no evidence of pelvic fracture or diastasis. No pelvic bone lesions are seen. Surgical clips overlie the pelvis. IMPRESSION: 1.  Negative for acute traumatic injury. 2. Right femoral trochanter collimated off view. Electronically Signed   By: Tish Frederickson M.D.   On: 09/03/2023 22:42   CT HEAD WO CONTRAST Result Date: 09/03/2023 CLINICAL DATA:  Pt fell today and hit the back of his head on the floor. Alert and oriented, but does not remember the fall. No obvious injury. Patient denies pain. EXAM: CT HEAD WITHOUT CONTRAST CT CERVICAL SPINE WITHOUT CONTRAST TECHNIQUE: Multidetector CT imaging of the head and cervical spine was performed following the standard protocol without intravenous contrast. Multiplanar CT image reconstructions of the cervical spine were also generated. RADIATION DOSE REDUCTION: This exam was performed according to the departmental dose-optimization program which includes automated exposure control, adjustment of the mA and/or kV according to patient size and/or use of iterative reconstruction technique. COMPARISON:  CT head  11/11/2022 FINDINGS: CT HEAD FINDINGS Brain: Cerebral ventricle sizes are concordant with the degree of cerebral volume loss. Patchy and confluent areas of decreased attenuation are noted throughout the deep and periventricular white matter of the cerebral hemispheres bilaterally, compatible with chronic microvascular ischemic disease. No evidence of large-territorial acute infarction. No parenchymal hemorrhage. No mass lesion. No extra-axial collection. No mass effect or midline shift. No hydrocephalus. Basilar cisterns are patent. Vascular: No hyperdense vessel. Atherosclerotic calcifications are present within the cavernous internal carotid arteries. Skull: No acute fracture or focal lesion. Sinuses/Orbits: Paranasal sinuses and mastoid air cells are clear. Bilateral lens replacement. Otherwise the orbits are unremarkable. Other: Left nasolacrimal duct enlargement (3:10). CT CERVICAL SPINE FINDINGS Alignment: Normal. Skull base and vertebrae: Multilevel moderate to severe degenerative changes spine with associated at least moderate left C4-C5 osseous neural foraminal stenosis. No severe osseous central canal stenosis. + No acute fracture. No aggressive appearing focal osseous lesion or focal pathologic process. Soft tissues and  spinal canal: No prevertebral fluid or swelling. No visible canal hematoma. Upper chest: Unremarkable. Other: Atherosclerotic plaque of the aortic arch and its main branches. IMPRESSION: 1. No acute intracranial abnormality. 2. No acute displaced fracture or traumatic listhesis of the cervical spine. 3. Indeterminate left nasolacrimal duct enlargement. Correlate with clinical exam. 4.  Aortic Atherosclerosis (ICD10-I70.0). Electronically Signed   By: Tish Frederickson M.D.   On: 09/03/2023 22:40   CT CERVICAL SPINE WO CONTRAST Result Date: 09/03/2023 CLINICAL DATA:  Pt fell today and hit the back of his head on the floor. Alert and oriented, but does not remember the fall. No obvious  injury. Patient denies pain. EXAM: CT HEAD WITHOUT CONTRAST CT CERVICAL SPINE WITHOUT CONTRAST TECHNIQUE: Multidetector CT imaging of the head and cervical spine was performed following the standard protocol without intravenous contrast. Multiplanar CT image reconstructions of the cervical spine were also generated. RADIATION DOSE REDUCTION: This exam was performed according to the departmental dose-optimization program which includes automated exposure control, adjustment of the mA and/or kV according to patient size and/or use of iterative reconstruction technique. COMPARISON:  CT head 11/11/2022 FINDINGS: CT HEAD FINDINGS Brain: Cerebral ventricle sizes are concordant with the degree of cerebral volume loss. Patchy and confluent areas of decreased attenuation are noted throughout the deep and periventricular white matter of the cerebral hemispheres bilaterally, compatible with chronic microvascular ischemic disease. No evidence of large-territorial acute infarction. No parenchymal hemorrhage. No mass lesion. No extra-axial collection. No mass effect or midline shift. No hydrocephalus. Basilar cisterns are patent. Vascular: No hyperdense vessel. Atherosclerotic calcifications are present within the cavernous internal carotid arteries. Skull: No acute fracture or focal lesion. Sinuses/Orbits: Paranasal sinuses and mastoid air cells are clear. Bilateral lens replacement. Otherwise the orbits are unremarkable. Other: Left nasolacrimal duct enlargement (3:10). CT CERVICAL SPINE FINDINGS Alignment: Normal. Skull base and vertebrae: Multilevel moderate to severe degenerative changes spine with associated at least moderate left C4-C5 osseous neural foraminal stenosis. No severe osseous central canal stenosis. + No acute fracture. No aggressive appearing focal osseous lesion or focal pathologic process. Soft tissues and spinal canal: No prevertebral fluid or swelling. No visible canal hematoma. Upper chest: Unremarkable.  Other: Atherosclerotic plaque of the aortic arch and its main branches. IMPRESSION: 1. No acute intracranial abnormality. 2. No acute displaced fracture or traumatic listhesis of the cervical spine. 3. Indeterminate left nasolacrimal duct enlargement. Correlate with clinical exam. 4.  Aortic Atherosclerosis (ICD10-I70.0). Electronically Signed   By: Tish Frederickson M.D.   On: 09/03/2023 22:40    DISCHARGE EXAMINATION: Vitals:   09/08/23 0001 09/08/23 0515 09/08/23 0848 09/08/23 0849  BP:  127/67 119/82   Pulse: 79 89 76   Resp: 17 19 16    Temp:  98.1 F (36.7 C) 98.3 F (36.8 C)   TempSrc:  Oral Oral   SpO2: 92% 92% 91% 91%  Weight:      Height:       General appearance: Awake alert.  In no distress Resp: Clear to auscultation bilaterally.  Normal effort Cardio: S1-S2 is normal regular.  No S3-S4.  No rubs murmurs or bruit GI: Abdomen is soft.  Nontender nondistended.  Bowel sounds are present normal.  No masses organomegaly Extremities: Right knee erythema is stable.  No drainage noted  DISPOSITION: SNF  Discharge Instructions     (HEART FAILURE PATIENTS) Call MD:  Anytime you have any of the following symptoms: 1) 3 pound weight gain in 24 hours or 5 pounds in 1 week 2)  shortness of breath, with or without a dry hacking cough 3) swelling in the hands, feet or stomach 4) if you have to sleep on extra pillows at night in order to breathe.   Complete by: As directed    Call MD for:  difficulty breathing, headache or visual disturbances   Complete by: As directed    Call MD for:  extreme fatigue   Complete by: As directed    Call MD for:  hives   Complete by: As directed    Call MD for:  persistant dizziness or light-headedness   Complete by: As directed    Call MD for:  persistant nausea and vomiting   Complete by: As directed    Call MD for:  severe uncontrolled pain   Complete by: As directed    Call MD for:  temperature >100.4   Complete by: As directed    Diet - low  sodium heart healthy   Complete by: As directed    Discharge instructions   Complete by: As directed    Please note that spironolactone and Cardizem have been discontinued.  The dose of furosemide has been decreased.  The dose of potassium has been decreased.  Please be sure to follow-up with your primary care provider in 1 week.  Cardiology will arrange a heart monitor. Please review instructions on the discharge summary.  You were cared for by a hospitalist during your hospital stay. If you have any questions about your discharge medications or the care you received while you were in the hospital after you are discharged, you can call the unit and asked to speak with the hospitalist on call if the hospitalist that took care of you is not available. Once you are discharged, your primary care physician will handle any further medical issues. Please note that NO REFILLS for any discharge medications will be authorized once you are discharged, as it is imperative that you return to your primary care physician (or establish a relationship with a primary care physician if you do not have one) for your aftercare needs so that they can reassess your need for medications and monitor your lab values. If you do not have a primary care physician, you can call [phone] for a physician referral.   Increase activity slowly   Complete by: As directed    No wound care   Complete by: As directed          Allergies as of 09/08/2023       Reactions   Lipitor [atorvastatin] Other (See Comments)   Myalgias   Zithromax [azithromycin] Other (See Comments)   GI intolerance   Zocor [simvastatin] Other (See Comments)   Myalgias    Norvasc [amlodipine] Swelling        Medication List     STOP taking these medications    diltiazem 300 MG 24 hr capsule Commonly known as: CARDIZEM CD   spironolactone 100 MG tablet Commonly known as: ALDACTONE       TAKE these medications    allopurinol 100 MG  tablet Commonly known as: ZYLOPRIM Take 100 mg by mouth daily.   apixaban 5 MG Tabs tablet Commonly known as: Eliquis TAKE 1 TABLET(5 MG) BY MOUTH TWICE DAILY What changed:  how much to take how to take this when to take this additional instructions   benzonatate 100 MG capsule Commonly known as: TESSALON Take 100 mg by mouth 3 (three) times daily as needed.   doxycycline 100 MG tablet Commonly  known as: VIBRA-TABS Take 1 tablet (100 mg total) by mouth every 12 (twelve) hours for 5 days.   ezetimibe 10 MG tablet Commonly known as: ZETIA Take 10 mg by mouth daily.   FISH OIL PO Take 1 capsule by mouth 2 (two) times daily.   FLUoxetine 10 MG capsule Commonly known as: PROZAC Take 10 mg by mouth every evening.   furosemide 20 MG tablet Commonly known as: LASIX Take 1 tablet (20 mg total) by mouth daily. Start taking on: September 10, 2023 What changed:  medication strength how much to take These instructions start on September 10, 2023. If you are unsure what to do until then, ask your doctor or other care provider.   guaiFENesin 600 MG 12 hr tablet Commonly known as: MUCINEX Take 600 mg by mouth 2 (two) times daily as needed for cough or to loosen phlegm.   melatonin 5 MG Tabs Take 5 mg by mouth at bedtime.   metFORMIN 1000 MG tablet Commonly known as: GLUCOPHAGE Take 1,000 mg by mouth 2 (two) times daily with a meal.   metoprolol succinate 100 MG 24 hr tablet Commonly known as: TOPROL-XL Take 1 tablet (100 mg total) by mouth daily. Take with or immediately following a meal. Start taking on: September 09, 2023 What changed:  medication strength how much to take additional instructions   Multivitamin Men 50+ Tabs Take 1 tablet by mouth every evening.   omeprazole 20 MG capsule Commonly known as: PRILOSEC Take 1 capsule (20 mg total) by mouth daily.   potassium chloride 10 MEQ tablet Commonly known as: KLOR-CON M Take 1 tablet (10 mEq total) by mouth daily. What  changed:  medication strength how much to take   pregabalin 100 MG capsule Commonly known as: LYRICA Take 1 capsule (100 mg total) by mouth 2 (two) times daily. What changed: when to take this   Soliqua 100-33 UNT-MCG/ML Sopn Generic drug: Insulin Glargine-Lixisenatide Inject 22 Units into the skin daily.          Follow-up Information     [facility] Follow up.   Specialty: Cardiology Why: cardiology service will mail you a 2 week heart monitor Contact information: [address], [address] [place]  [address] [phone]        Jacqueline Matsu, MD Follow up on 10/05/2023.   Specialty: Cardiology Why: 1:00PM. Sleep clinic Contact information: [address] [place] [address] [phone]         Sheryle Donning, MD Follow up on 10/20/2023.   Specialty: Cardiology Why: 3:20PM. Cardiology follow up Contact information: [address] [place] [address] [phone]         Glena Landau, MD Follow up.   Specialty: Family Medicine Why: for labs to check CBC and BMET Contact information: [address] [place] [address] [phone]                 TOTAL DISCHARGE TIME: 35 minutes  Dominic Rhome Lyndon Santiago  [facility] Pager on [email or web address]  09/08/2023, 10:04 AM

## 2023-09-08 NOTE — Progress Notes (Signed)
 Report called to Leota Randy, RN at [facility].  Pt picked up by [facility] for transport.

## 2023-09-08 NOTE — Plan of Care (Signed)
 Discharging to [facility].  [facility] here for transport now.

## 2023-09-08 NOTE — Progress Notes (Signed)
   09/08/23 0001  BiPAP/CPAP/SIPAP  Reason BIPAP/CPAP not in use Non-compliant (pt refused. States he can't tolerate our equipment. Advised to call if he decides he wants to utilize our equipment)  BiPAP/CPAP /SiPAP Vitals  Pulse Rate 79  Resp 17  SpO2 92 %  Bilateral Breath Sounds Clear

## 2023-09-08 NOTE — TOC Transition Note (Signed)
 Transition of Care [facility]) - Discharge Note   Patient Details  Name: Steve Jensen MRN: [id number] Date of Birth: [birth year]  [facility]) CM/SW Contact:  Carmon Christen, LCSWA Phone Number: 09/08/2023, 12:25 PM   Clinical Narrative:     Patient will DC to: [facility] SNF  Anticipated DC date: 09/08/2023  Family notified: [name]   Transport by: Lyna Sandhoff   ?  Per MD patient ready for DC to [facility] . RN, patient, patient's family, and facility notified of DC. Discharge Summary sent to facility. RN given number for report tele# [phone] RM#[id number]. DC packet on chart. Patients daughter-n-law confirmed she will bring patients cpap from home to facility.DNR signed by MD attached to patients DC packet.Ambulance transport requested for patient.  CSW signing off.   Final next level of care: Skilled Nursing Facility Barriers to Discharge: No Barriers Identified   Patient Goals and CMS Choice Patient states their goals for this hospitalization and ongoing recovery are:: SNF   Choice offered to / list presented to : Patient      Discharge Placement              Patient chooses bed at: [facility] Patient to be transferred to facility by: [facility] Name of family member notified: [name] Patient and family notified of of transfer: 09/08/23  Discharge Plan and Services Additional resources added to the After Visit Summary for   In-house Referral: Clinical Social Work                                   Social Drivers of Health (SDOH) Interventions SDOH Screenings   Food Insecurity: No Food Insecurity (09/04/2023)  Housing: Low Risk  (09/04/2023)  Transportation Needs: No Transportation Needs (09/04/2023)  Utilities: Not At Risk (09/04/2023)  Social Connections: Socially Isolated (09/04/2023)  Tobacco Use: Medium Risk (09/04/2023)     Readmission Risk Interventions    12/26/2021   12:51 PM 12/25/2021    3:31 PM 12/19/2021   12:39 PM   Readmission Risk Prevention Plan  Transportation Screening  Complete Complete  PCP or Specialist Appt within 5-7 Days Complete    Home Care Screening  Complete   Medication Review (RN CM)  Complete   HRI or Home Care Consult   Complete  Social Work Consult for Recovery Care Planning/Counseling   Complete  Palliative Care Screening   Not Applicable  Medication Review Oceanographer)   Complete

## 2023-09-08 NOTE — Plan of Care (Signed)
  Problem: Clinical Measurements: Goal: Respiratory complications will improve Outcome: Progressing Goal: Cardiovascular complication will be avoided Outcome: Progressing   Problem: Nutrition: Goal: Adequate nutrition will be maintained Outcome: Progressing   Problem: Elimination: Goal: Will not experience complications related to urinary retention Outcome: Progressing   Problem: Pain Managment: Goal: General experience of comfort will improve and/or be controlled Outcome: Progressing   Problem: Safety: Goal: Ability to remain free from injury will improve Outcome: Progressing   Problem: Clinical Measurements: Goal: Ability to avoid or minimize complications of infection will improve Outcome: Progressing

## 2023-09-08 NOTE — Inpatient Diabetes Management (Signed)
 Inpatient Diabetes Program Recommendations  AACE/ADA: New Consensus Statement on Inpatient Glycemic Control   Target Ranges:  Prepandial:   less than 140 mg/dL      Peak postprandial:   less than 180 mg/dL (1-2 hours)      Critically ill patients:  140 - 180 mg/dL    Latest Reference Range & Units 09/07/23 08:16 09/07/23 11:50 09/07/23 16:42 09/07/23 20:56 09/08/23 07:58  Glucose-Capillary 70 - 99 mg/dL 952 (H) 841 (H) 324 (H) 205 (H) 220 (H)   Review of Glycemic Control  Diabetes history: DM2 Outpatient Diabetes medications: Metformin 1000 mg BID, Soliqua 100-33 unit-mcg/ml 22 units daily Current orders for Inpatient glycemic control: Novolog 0-15 units TID with meals, Novolog 0-5 units QHS  Inpatient Diabetes Program Recommendations:    Insulin: Please consider ordering Semglee 8 units daily.  Thanks, Beacher Limerick, RN, MSN, CDCES Diabetes Coordinator Inpatient Diabetes Program [phone] (Team Pager from 8am to 5pm)

## 2023-09-08 NOTE — TOC Progression Note (Signed)
 Transition of Care [facility]) - Progression Note    Patient Details  Name: Steve Jensen MRN: [id number] Date of Birth: 1940-12-29  [facility]) CM/SW Contact  Carmon Christen, LCSWA Phone Number: 09/08/2023, 11:56 AM  Clinical Narrative:     CSW received insurance authorization for patient. Plan Auth ID# [id number]. Auth ID# [id number]. Insurance authorization approved from 4/15-4/17. Kristin with [facility] confirmed they can offer SNF bed for patient. CSW spoke with patient at bedside. Patient accepted SNF bed offer with [facility]. Kristin with [facility] confirmed patient can dc over today if medically stable for dc. CSW informed MD. CSW will continue to follow and assist with patients dc planning needs.  Expected Discharge Plan: Skilled Nursing Facility Barriers to Discharge: Continued Medical Work up  Expected Discharge Plan and Services In-house Referral: Clinical Social Work     Living arrangements for the past 2 months: Single Family Home                                       Social Determinants of Health (SDOH) Interventions SDOH Screenings   Food Insecurity: No Food Insecurity (09/04/2023)  Housing: Low Risk  (09/04/2023)  Transportation Needs: No Transportation Needs (09/04/2023)  Utilities: Not At Risk (09/04/2023)  Social Connections: Socially Isolated (09/04/2023)  Tobacco Use: Medium Risk (09/04/2023)    Readmission Risk Interventions    12/26/2021   12:51 PM 12/25/2021    3:31 PM 12/19/2021   12:39 PM  Readmission Risk Prevention Plan  Transportation Screening  Complete Complete  PCP or Specialist Appt within 5-7 Days Complete    Home Care Screening  Complete   Medication Review (RN CM)  Complete   HRI or Home Care Consult   Complete  Social Work Consult for Recovery Care Planning/Counseling   Complete  Palliative Care Screening   Not Applicable  Medication Review Oceanographer)   Complete

## 2023-09-09 ENCOUNTER — Ambulatory Visit: Attending: Physician Assistant

## 2023-09-09 DIAGNOSIS — R55 Syncope and collapse: Secondary | ICD-10-CM | POA: Diagnosis not present

## 2023-09-09 DIAGNOSIS — I5030 Unspecified diastolic (congestive) heart failure: Secondary | ICD-10-CM | POA: Diagnosis not present

## 2023-09-09 DIAGNOSIS — E119 Type 2 diabetes mellitus without complications: Secondary | ICD-10-CM | POA: Diagnosis not present

## 2023-09-09 DIAGNOSIS — I4891 Unspecified atrial fibrillation: Secondary | ICD-10-CM | POA: Diagnosis not present

## 2023-09-09 DIAGNOSIS — I130 Hypertensive heart and chronic kidney disease with heart failure and stage 1 through stage 4 chronic kidney disease, or unspecified chronic kidney disease: Secondary | ICD-10-CM | POA: Diagnosis not present

## 2023-09-09 DIAGNOSIS — I5032 Chronic diastolic (congestive) heart failure: Secondary | ICD-10-CM | POA: Diagnosis not present

## 2023-09-09 DIAGNOSIS — J309 Allergic rhinitis, unspecified: Secondary | ICD-10-CM | POA: Diagnosis not present

## 2023-09-09 DIAGNOSIS — N189 Chronic kidney disease, unspecified: Secondary | ICD-10-CM | POA: Diagnosis not present

## 2023-09-09 DIAGNOSIS — M109 Gout, unspecified: Secondary | ICD-10-CM | POA: Diagnosis not present

## 2023-09-09 DIAGNOSIS — I10 Essential (primary) hypertension: Secondary | ICD-10-CM | POA: Diagnosis not present

## 2023-09-09 DIAGNOSIS — K219 Gastro-esophageal reflux disease without esophagitis: Secondary | ICD-10-CM | POA: Diagnosis not present

## 2023-09-09 DIAGNOSIS — L0390 Cellulitis, unspecified: Secondary | ICD-10-CM | POA: Diagnosis not present

## 2023-09-09 DIAGNOSIS — E559 Vitamin D deficiency, unspecified: Secondary | ICD-10-CM | POA: Diagnosis not present

## 2023-09-09 DIAGNOSIS — R0602 Shortness of breath: Secondary | ICD-10-CM | POA: Diagnosis not present

## 2023-09-09 DIAGNOSIS — E785 Hyperlipidemia, unspecified: Secondary | ICD-10-CM | POA: Diagnosis not present

## 2023-09-09 DIAGNOSIS — R197 Diarrhea, unspecified: Secondary | ICD-10-CM | POA: Diagnosis not present

## 2023-09-09 NOTE — Progress Notes (Unsigned)
 Enrolled for [employer] to mail a ZIO XT long term holter monitor to the patients address on file.   Follow up Dr. Lorayne Rocks 5/27

## 2023-09-10 DIAGNOSIS — R197 Diarrhea, unspecified: Secondary | ICD-10-CM | POA: Diagnosis not present

## 2023-09-10 DIAGNOSIS — G4733 Obstructive sleep apnea (adult) (pediatric): Secondary | ICD-10-CM | POA: Diagnosis not present

## 2023-09-10 DIAGNOSIS — I5030 Unspecified diastolic (congestive) heart failure: Secondary | ICD-10-CM | POA: Diagnosis not present

## 2023-09-10 DIAGNOSIS — R0602 Shortness of breath: Secondary | ICD-10-CM | POA: Diagnosis not present

## 2023-09-10 DIAGNOSIS — I10 Essential (primary) hypertension: Secondary | ICD-10-CM | POA: Diagnosis not present

## 2023-09-10 DIAGNOSIS — J309 Allergic rhinitis, unspecified: Secondary | ICD-10-CM | POA: Diagnosis not present

## 2023-09-10 DIAGNOSIS — N189 Chronic kidney disease, unspecified: Secondary | ICD-10-CM | POA: Diagnosis not present

## 2023-09-10 DIAGNOSIS — L0390 Cellulitis, unspecified: Secondary | ICD-10-CM | POA: Diagnosis not present

## 2023-09-10 DIAGNOSIS — I4891 Unspecified atrial fibrillation: Secondary | ICD-10-CM | POA: Diagnosis not present

## 2023-09-10 DIAGNOSIS — E119 Type 2 diabetes mellitus without complications: Secondary | ICD-10-CM | POA: Diagnosis not present

## 2023-09-10 DIAGNOSIS — R55 Syncope and collapse: Secondary | ICD-10-CM | POA: Diagnosis not present

## 2023-09-10 DIAGNOSIS — N179 Acute kidney failure, unspecified: Secondary | ICD-10-CM | POA: Diagnosis not present

## 2023-09-11 DIAGNOSIS — K560 Paralytic ileus: Secondary | ICD-10-CM | POA: Diagnosis not present

## 2023-09-14 DIAGNOSIS — L0390 Cellulitis, unspecified: Secondary | ICD-10-CM | POA: Diagnosis not present

## 2023-09-14 DIAGNOSIS — I4891 Unspecified atrial fibrillation: Secondary | ICD-10-CM | POA: Diagnosis not present

## 2023-09-14 DIAGNOSIS — C220 Liver cell carcinoma: Secondary | ICD-10-CM | POA: Diagnosis not present

## 2023-09-14 DIAGNOSIS — N189 Chronic kidney disease, unspecified: Secondary | ICD-10-CM | POA: Diagnosis not present

## 2023-09-14 DIAGNOSIS — N179 Acute kidney failure, unspecified: Secondary | ICD-10-CM | POA: Diagnosis not present

## 2023-09-14 DIAGNOSIS — I5030 Unspecified diastolic (congestive) heart failure: Secondary | ICD-10-CM | POA: Diagnosis not present

## 2023-09-14 DIAGNOSIS — E119 Type 2 diabetes mellitus without complications: Secondary | ICD-10-CM | POA: Diagnosis not present

## 2023-09-14 DIAGNOSIS — E785 Hyperlipidemia, unspecified: Secondary | ICD-10-CM | POA: Diagnosis not present

## 2023-09-14 DIAGNOSIS — R55 Syncope and collapse: Secondary | ICD-10-CM | POA: Diagnosis not present

## 2023-09-14 DIAGNOSIS — G4733 Obstructive sleep apnea (adult) (pediatric): Secondary | ICD-10-CM | POA: Diagnosis not present

## 2023-09-14 DIAGNOSIS — I10 Essential (primary) hypertension: Secondary | ICD-10-CM | POA: Diagnosis not present

## 2023-09-14 DIAGNOSIS — R5381 Other malaise: Secondary | ICD-10-CM | POA: Diagnosis not present

## 2023-09-16 DIAGNOSIS — R55 Syncope and collapse: Secondary | ICD-10-CM | POA: Diagnosis not present

## 2023-09-16 DIAGNOSIS — I10 Essential (primary) hypertension: Secondary | ICD-10-CM | POA: Diagnosis not present

## 2023-09-16 DIAGNOSIS — N179 Acute kidney failure, unspecified: Secondary | ICD-10-CM | POA: Diagnosis not present

## 2023-09-16 DIAGNOSIS — I4891 Unspecified atrial fibrillation: Secondary | ICD-10-CM | POA: Diagnosis not present

## 2023-09-16 DIAGNOSIS — G4733 Obstructive sleep apnea (adult) (pediatric): Secondary | ICD-10-CM | POA: Diagnosis not present

## 2023-09-16 DIAGNOSIS — C220 Liver cell carcinoma: Secondary | ICD-10-CM | POA: Diagnosis not present

## 2023-09-16 DIAGNOSIS — E785 Hyperlipidemia, unspecified: Secondary | ICD-10-CM | POA: Diagnosis not present

## 2023-09-16 DIAGNOSIS — E119 Type 2 diabetes mellitus without complications: Secondary | ICD-10-CM | POA: Diagnosis not present

## 2023-09-16 DIAGNOSIS — I5030 Unspecified diastolic (congestive) heart failure: Secondary | ICD-10-CM | POA: Diagnosis not present

## 2023-09-16 DIAGNOSIS — K567 Ileus, unspecified: Secondary | ICD-10-CM | POA: Diagnosis not present

## 2023-09-16 DIAGNOSIS — N189 Chronic kidney disease, unspecified: Secondary | ICD-10-CM | POA: Diagnosis not present

## 2023-09-16 DIAGNOSIS — L0390 Cellulitis, unspecified: Secondary | ICD-10-CM | POA: Diagnosis not present

## 2023-09-22 DIAGNOSIS — S81801A Unspecified open wound, right lower leg, initial encounter: Secondary | ICD-10-CM | POA: Diagnosis not present

## 2023-09-22 DIAGNOSIS — S81002A Unspecified open wound, left knee, initial encounter: Secondary | ICD-10-CM | POA: Diagnosis not present

## 2023-09-23 DIAGNOSIS — I4891 Unspecified atrial fibrillation: Secondary | ICD-10-CM | POA: Diagnosis not present

## 2023-09-23 DIAGNOSIS — E782 Mixed hyperlipidemia: Secondary | ICD-10-CM | POA: Diagnosis not present

## 2023-09-23 DIAGNOSIS — I5033 Acute on chronic diastolic (congestive) heart failure: Secondary | ICD-10-CM | POA: Diagnosis not present

## 2023-09-23 DIAGNOSIS — I509 Heart failure, unspecified: Secondary | ICD-10-CM | POA: Diagnosis not present

## 2023-09-28 DIAGNOSIS — K219 Gastro-esophageal reflux disease without esophagitis: Secondary | ICD-10-CM | POA: Diagnosis not present

## 2023-09-28 DIAGNOSIS — D649 Anemia, unspecified: Secondary | ICD-10-CM | POA: Diagnosis not present

## 2023-09-28 DIAGNOSIS — I10 Essential (primary) hypertension: Secondary | ICD-10-CM | POA: Diagnosis not present

## 2023-09-28 DIAGNOSIS — R55 Syncope and collapse: Secondary | ICD-10-CM | POA: Diagnosis not present

## 2023-09-28 DIAGNOSIS — G4733 Obstructive sleep apnea (adult) (pediatric): Secondary | ICD-10-CM | POA: Diagnosis not present

## 2023-09-28 DIAGNOSIS — M109 Gout, unspecified: Secondary | ICD-10-CM | POA: Diagnosis not present

## 2023-09-28 DIAGNOSIS — I5030 Unspecified diastolic (congestive) heart failure: Secondary | ICD-10-CM | POA: Diagnosis not present

## 2023-09-28 DIAGNOSIS — I4891 Unspecified atrial fibrillation: Secondary | ICD-10-CM | POA: Diagnosis not present

## 2023-09-28 DIAGNOSIS — C220 Liver cell carcinoma: Secondary | ICD-10-CM | POA: Diagnosis not present

## 2023-09-28 DIAGNOSIS — E119 Type 2 diabetes mellitus without complications: Secondary | ICD-10-CM | POA: Diagnosis not present

## 2023-09-28 DIAGNOSIS — G4700 Insomnia, unspecified: Secondary | ICD-10-CM | POA: Diagnosis not present

## 2023-09-28 DIAGNOSIS — N189 Chronic kidney disease, unspecified: Secondary | ICD-10-CM | POA: Diagnosis not present

## 2023-09-29 DIAGNOSIS — S81002A Unspecified open wound, left knee, initial encounter: Secondary | ICD-10-CM | POA: Diagnosis not present

## 2023-09-29 DIAGNOSIS — S81801A Unspecified open wound, right lower leg, initial encounter: Secondary | ICD-10-CM | POA: Diagnosis not present

## 2023-09-30 DIAGNOSIS — G4733 Obstructive sleep apnea (adult) (pediatric): Secondary | ICD-10-CM | POA: Diagnosis not present

## 2023-09-30 DIAGNOSIS — N189 Chronic kidney disease, unspecified: Secondary | ICD-10-CM | POA: Diagnosis not present

## 2023-09-30 DIAGNOSIS — R5381 Other malaise: Secondary | ICD-10-CM | POA: Diagnosis not present

## 2023-09-30 DIAGNOSIS — K219 Gastro-esophageal reflux disease without esophagitis: Secondary | ICD-10-CM | POA: Diagnosis not present

## 2023-09-30 DIAGNOSIS — N179 Acute kidney failure, unspecified: Secondary | ICD-10-CM | POA: Diagnosis not present

## 2023-09-30 DIAGNOSIS — R197 Diarrhea, unspecified: Secondary | ICD-10-CM | POA: Diagnosis not present

## 2023-09-30 DIAGNOSIS — C220 Liver cell carcinoma: Secondary | ICD-10-CM | POA: Diagnosis not present

## 2023-09-30 DIAGNOSIS — R55 Syncope and collapse: Secondary | ICD-10-CM | POA: Diagnosis not present

## 2023-09-30 DIAGNOSIS — I4891 Unspecified atrial fibrillation: Secondary | ICD-10-CM | POA: Diagnosis not present

## 2023-09-30 DIAGNOSIS — E119 Type 2 diabetes mellitus without complications: Secondary | ICD-10-CM | POA: Diagnosis not present

## 2023-09-30 DIAGNOSIS — I10 Essential (primary) hypertension: Secondary | ICD-10-CM | POA: Diagnosis not present

## 2023-09-30 DIAGNOSIS — I5030 Unspecified diastolic (congestive) heart failure: Secondary | ICD-10-CM | POA: Diagnosis not present

## 2023-10-05 ENCOUNTER — Encounter: Payer: Self-pay | Admitting: Cardiology

## 2023-10-05 ENCOUNTER — Ambulatory Visit: Attending: Cardiology | Admitting: Cardiology

## 2023-10-05 VITALS — BP 119/77 | HR 79 | Ht 71.0 in | Wt 275.0 lb

## 2023-10-05 DIAGNOSIS — I480 Paroxysmal atrial fibrillation: Secondary | ICD-10-CM | POA: Diagnosis not present

## 2023-10-05 DIAGNOSIS — I5032 Chronic diastolic (congestive) heart failure: Secondary | ICD-10-CM | POA: Diagnosis not present

## 2023-10-05 DIAGNOSIS — G4733 Obstructive sleep apnea (adult) (pediatric): Secondary | ICD-10-CM | POA: Diagnosis not present

## 2023-10-05 DIAGNOSIS — I10 Essential (primary) hypertension: Secondary | ICD-10-CM | POA: Diagnosis not present

## 2023-10-05 NOTE — Progress Notes (Addendum)
 Evaluation Performed:  Follow-up visit  Date:  10/05/2023   ID:  TANNEN BENNION, DOB May 04, 1941, MRN [id number]    PCP:  Glena Landau, MD  Sleep Medicine:  Gaylyn Keas, ND Electrophysiologist:  None   Chief Complaint:  OSA  History of Present Illness:    Steve Jensen is a 83 y.o. male  with a hx of OSA on BiPAP, morbid obesity, diabetes mellitus, paroxysmal atrial fibrillation status post remote cardioversion and HTN.   He is doing well with his PAP device and thinks that he has gotten used to it.  He tolerates the nasal mask and feels the pressure is adequate.  Since going on PAP he feels rested in the am but does get sleepy during the day and takes a nap.  He goes to bed at 2am and gets up at 5am and then sleeps during the day. He denies any significant nasal dryness or nasal congestion.  He has had a lot of mouth dryness and does not use a chin strap with his nasal mask. He does not think that he snores.     Prior CV studies:   The following studies were reviewed today:  PAP download  Past Medical History:  Diagnosis Date   Anemia    Diabetes mellitus type II, controlled (HCC)    with neuropathy   Dysrhythmia    A-Fib. cardioversion done   GERD (gastroesophageal reflux disease)    Hyperlipidemia    Hypertension    Morbid obesity (HCC)    Neuromuscular disorder (HCC)    feet   OSA (obstructive sleep apnea)    On BiPAP at 15/11cm H2O   Prostate cancer (HCC)    Shingles    on face Nov 2010   Urinary incontinence    Past Surgical History:  Procedure Laterality Date   CARDIOVERSION N/A 09/06/2013   Procedure: CARDIOVERSION;  Surgeon: Lucendia Rusk, MD;  Location: [facility] ENDOSCOPY;  Service: Cardiovascular;  Laterality: N/A;   EYE SURGERY     cataract surgery bilat; surgery to correct droopy eyelids    FLEXIBLE SIGMOIDOSCOPY N/A 12/24/2021   Procedure: FLEXIBLE SIGMOIDOSCOPY;  Surgeon: Urban Garden, MD;  Location: [facility] ENDO SUITE;  Service:  Gastroenterology;  Laterality: N/A;   IR ANGIOGRAM SELECTIVE EACH ADDITIONAL VESSEL  02/15/2018   IR ANGIOGRAM SELECTIVE EACH ADDITIONAL VESSEL  02/15/2018   IR ANGIOGRAM SELECTIVE EACH ADDITIONAL VESSEL  02/15/2018   IR ANGIOGRAM SELECTIVE EACH ADDITIONAL VESSEL  02/15/2018   IR ANGIOGRAM SELECTIVE EACH ADDITIONAL VESSEL  02/15/2018   IR ANGIOGRAM VISCERAL SELECTIVE  02/15/2018   IR ANGIOGRAM VISCERAL SELECTIVE  02/15/2018   IR EMBO TUMOR ORGAN ISCHEMIA INFARCT INC GUIDE ROADMAPPING  02/15/2018   IR RADIOLOGIST EVAL & MGMT  01/26/2018   IR RADIOLOGIST EVAL & MGMT  03/17/2018   IR RADIOLOGIST EVAL & MGMT  06/29/2018   IR RADIOLOGIST EVAL & MGMT  07/27/2018   IR RADIOLOGIST EVAL & MGMT  10/28/2018   IR RADIOLOGIST EVAL & MGMT  12/14/2018   IR RADIOLOGIST EVAL & MGMT  03/17/2019   IR RADIOLOGIST EVAL & MGMT  07/14/2019   IR RADIOLOGIST EVAL & MGMT  12/27/2019   IR RADIOLOGIST EVAL & MGMT  08/21/2020   IR RADIOLOGIST EVAL & MGMT  02/28/2021   IR RADIOLOGIST EVAL & MGMT  06/25/2021   IR RADIOLOGIST EVAL & MGMT  10/18/2021   IR US  GUIDE VASC ACCESS RIGHT  02/15/2018   knee surgery bilat  PROSTATE SURGERY     RADIOLOGY WITH ANESTHESIA N/A 11/24/2018   Procedure: MICROWAVE THERMAL ABLATION LIVER;  Surgeon: Robbi Childs, MD;  Location: [facility] ORS;  Service: Anesthesiology;  Laterality: N/A;   right shoulder rotator cuff surgery     TONSILLECTOMY       Current Meds  Medication Sig   allopurinol  (ZYLOPRIM ) 100 MG tablet Take 100 mg by mouth daily.   apixaban  (ELIQUIS ) 5 MG TABS tablet TAKE 1 TABLET(5 MG) BY MOUTH TWICE DAILY (Patient taking differently: Take 5 mg by mouth 2 (two) times daily.)   benzonatate  (TESSALON ) 100 MG capsule Take 100 mg by mouth 3 (three) times daily as needed.   ezetimibe  (ZETIA ) 10 MG tablet Take 10 mg by mouth daily.   FLUoxetine  (PROZAC ) 10 MG capsule Take 10 mg by mouth every evening.   furosemide  (LASIX ) 20 MG tablet Take 1 tablet (20 mg total) by mouth daily.   guaiFENesin  (MUCINEX )  600 MG 12 hr tablet Take 600 mg by mouth 2 (two) times daily as needed for cough or to loosen phlegm.   Insulin  Glargine-Lixisenatide (SOLIQUA) 100-33 UNT-MCG/ML SOPN Inject 22 Units into the skin daily.   melatonin 5 MG TABS Take 5 mg by mouth at bedtime.   metFORMIN  (GLUCOPHAGE ) 1000 MG tablet Take 1,000 mg by mouth 2 (two) times daily with a meal.   metoprolol  succinate (TOPROL -XL) 100 MG 24 hr tablet Take 1 tablet (100 mg total) by mouth daily. Take with or immediately following a meal.   Multiple Vitamins-Minerals (MULTIVITAMIN MEN 50+) TABS Take 1 tablet by mouth every evening.   Omega-3 Fatty Acids (FISH OIL PO) Take 1 capsule by mouth 2 (two) times daily.   omeprazole  (PRILOSEC) 20 MG capsule Take 1 capsule (20 mg total) by mouth daily.   potassium chloride  SA (KLOR-CON  M) 10 MEQ tablet Take 1 tablet (10 mEq total) by mouth daily.   pregabalin  (LYRICA ) 100 MG capsule Take 1 capsule (100 mg total) by mouth 2 (two) times daily.     Allergies:   Lipitor [atorvastatin], Zithromax [azithromycin], Zocor [simvastatin], and Norvasc  [amlodipine ]   Social History   Tobacco Use   Smoking status: Former    Current packs/day: 0.00    Average packs/day: 1 pack/day for 15.0 years (15.0 ttl pk-yrs)    Types: Cigarettes    Start date: 05/26/1960    Quit date: 05/27/1975    Years since quitting: 48.3   Smokeless tobacco: Never  Vaping Use   Vaping status: Never Used  Substance Use Topics   Alcohol use: No   Drug use: No     Family Hx: The patient's family history includes CVA in his brother; Cancer in his brother; Diabetes in his brother, brother, and brother; Heart attack in his brother; Heart disease in his brother; Hypertension in his brother, brother, and brother; Prostate cancer in his brother; Stroke in his brother.  ROS:   Please see the history of present illness.     All other systems reviewed and are negative.   Labs/Other Tests and Data Reviewed:    Recent Labs: 09/03/2023:  ALT 20; B Natriuretic Peptide 152.3 09/04/2023: TSH 2.024 09/08/2023: BUN 27; Creatinine, Ser 1.35; Hemoglobin 13.7; Magnesium  1.8; Platelets 438; Potassium 4.2; Sodium 138   Recent Lipid Panel Lab Results  Component Value Date/Time   CHOL 158 12/18/2014 10:39 AM   TRIG 188.0 (H) 12/18/2014 10:39 AM   HDL 31.00 (L) 12/18/2014 10:39 AM   CHOLHDL 5 12/18/2014 10:39 AM  LDLCALC 89 12/18/2014 10:39 AM    Wt Readings from Last 3 Encounters:  10/05/23 275 lb (124.7 kg)  09/04/23 282 lb 13.6 oz (128.3 kg)  06/29/23 281 lb 9.6 oz (127.7 kg)     Objective:    Vital Signs:  BP 119/77   Pulse 79   Ht 5\' 11"  (1.803 m)   Wt 275 lb (124.7 kg)   SpO2 91%   BMI 38.35 kg/m   GEN: Well nourished, well developed in no acute distress HEENT: Normal NECK: No JVD; No carotid bruits LYMPHATICS: No lymphadenopathy CARDIAC:irregularly irregular, no murmurs, rubs, gallops RESPIRATORY:  Clear to auscultation without rales, wheezing or rhonchi  ABDOMEN: Soft, non-tender, non-distended MUSCULOSKELETAL:  1+ bilateral LE edema; No deformity  SKIN: Warm and dry NEUROLOGIC:  Alert and oriented x 3 PSYCHIATRIC:  Normal affect  ASSESSMENT & PLAN:    #OSA - The patient is tolerating PAP therapy well without any problems. The PAP download performed by his DME was personally reviewed and interpreted by me today and showed an AHI of 7.5 /hr on BiPAP 15/11 cm H2O with 50% compliance in using more than 4 hours nightly.  The patient has been using and benefiting from PAP use and will continue to benefit from therapy.  -His AHI is elevated likely because of a big mask leak>>he has been changing out the cushion every 3 months instead of every 4 weeks -I encouraged him to try not to nap during the day so that he gets 7-8 hours of sleep at night.  If he must nap during the day then I encouraged him to use his PAP device so he gets enough time on it to meet compliance -I will get him a chin strap -Repeat download in 4  weeks -we discussed the Inpsire device but he is not a candidate at this time due to BMI of 28  #Hypertension  - BP control on exam today - Continue Toprol -XL 100 mg daily with as needed refills  Patient Risk:   After full review of this patient's clinical status, I feel that they are at least moderate risk at this time.  Time:   Today, I have spent 20 minutes discussing medical problems including OSA, HTN, obesity and reviewing patient's chart including PAP compliance download.  Medication Adjustments/Labs and Tests Ordered: Current medicines are reviewed at length with the patient today.  Concerns regarding medicines are outlined above.  Tests Ordered: No orders of the defined types were placed in this encounter.   Medication Changes: No orders of the defined types were placed in this encounter.    Disposition:  Follow up in 1 year(s)  Signed, Gaylyn Keas, MD  10/05/2023 1:37 PM    [facility]

## 2023-10-05 NOTE — Patient Instructions (Signed)
 Medication Instructions:  Your physician recommends that you continue on your current medications as directed. Please refer to the Current Medication list given to you today.  *If you need a refill on your cardiac medications before your next appointment, please call your pharmacy*  Lab Work: None.  If you have labs (blood work) drawn today and your tests are completely normal, you will receive your results only by: MyChart Message (if you have MyChart) OR A paper copy in the mail If you have any lab test that is abnormal or we need to change your treatment, we will call you to review the results.  Testing/Procedures: None.  Follow-Up: At [facility], you and your health needs are our priority.  As part of our continuing mission to provide you with exceptional heart care, our providers are all part of one team.  This team includes your primary Cardiologist (physician) and Advanced Practice Providers or APPs (Physician Assistants and Nurse Practitioners) who all work together to provide you with the care you need, when you need it.  Your next appointment:   1 year(s)  Provider:   Dr. Gaylyn Keas, MD     Other Instructions Dr. Micael Adas has ordered a chin strap for you. Once this is delivered and you have been using it for a month, our sleep team will check a download to see if your breathing at night has improved.

## 2023-10-06 DIAGNOSIS — I10 Essential (primary) hypertension: Secondary | ICD-10-CM | POA: Diagnosis not present

## 2023-10-06 DIAGNOSIS — S81002A Unspecified open wound, left knee, initial encounter: Secondary | ICD-10-CM | POA: Diagnosis not present

## 2023-10-06 DIAGNOSIS — S81801A Unspecified open wound, right lower leg, initial encounter: Secondary | ICD-10-CM | POA: Diagnosis not present

## 2023-10-13 DIAGNOSIS — I10 Essential (primary) hypertension: Secondary | ICD-10-CM | POA: Diagnosis not present

## 2023-10-13 DIAGNOSIS — S81801A Unspecified open wound, right lower leg, initial encounter: Secondary | ICD-10-CM | POA: Diagnosis not present

## 2023-10-13 DIAGNOSIS — L97521 Non-pressure chronic ulcer of other part of left foot limited to breakdown of skin: Secondary | ICD-10-CM | POA: Diagnosis not present

## 2023-10-13 DIAGNOSIS — S81002A Unspecified open wound, left knee, initial encounter: Secondary | ICD-10-CM | POA: Diagnosis not present

## 2023-10-16 DIAGNOSIS — I5030 Unspecified diastolic (congestive) heart failure: Secondary | ICD-10-CM | POA: Diagnosis not present

## 2023-10-16 DIAGNOSIS — D72829 Elevated white blood cell count, unspecified: Secondary | ICD-10-CM | POA: Diagnosis not present

## 2023-10-16 DIAGNOSIS — L98499 Non-pressure chronic ulcer of skin of other sites with unspecified severity: Secondary | ICD-10-CM | POA: Diagnosis not present

## 2023-10-16 DIAGNOSIS — E119 Type 2 diabetes mellitus without complications: Secondary | ICD-10-CM | POA: Diagnosis not present

## 2023-10-16 DIAGNOSIS — L97919 Non-pressure chronic ulcer of unspecified part of right lower leg with unspecified severity: Secondary | ICD-10-CM | POA: Diagnosis not present

## 2023-10-16 DIAGNOSIS — L97519 Non-pressure chronic ulcer of other part of right foot with unspecified severity: Secondary | ICD-10-CM | POA: Diagnosis not present

## 2023-10-20 ENCOUNTER — Ambulatory Visit (HOSPITAL_BASED_OUTPATIENT_CLINIC_OR_DEPARTMENT_OTHER): Payer: Medicare Other | Admitting: Cardiology

## 2023-10-20 ENCOUNTER — Encounter (HOSPITAL_BASED_OUTPATIENT_CLINIC_OR_DEPARTMENT_OTHER): Payer: Self-pay | Admitting: Cardiology

## 2023-10-20 VITALS — BP 108/58 | HR 100 | Ht 69.0 in | Wt 285.9 lb

## 2023-10-20 DIAGNOSIS — E785 Hyperlipidemia, unspecified: Secondary | ICD-10-CM | POA: Diagnosis not present

## 2023-10-20 DIAGNOSIS — Z7901 Long term (current) use of anticoagulants: Secondary | ICD-10-CM | POA: Diagnosis not present

## 2023-10-20 DIAGNOSIS — D6869 Other thrombophilia: Secondary | ICD-10-CM | POA: Diagnosis not present

## 2023-10-20 DIAGNOSIS — S81002A Unspecified open wound, left knee, initial encounter: Secondary | ICD-10-CM | POA: Diagnosis not present

## 2023-10-20 DIAGNOSIS — I4811 Longstanding persistent atrial fibrillation: Secondary | ICD-10-CM | POA: Diagnosis not present

## 2023-10-20 DIAGNOSIS — I5032 Chronic diastolic (congestive) heart failure: Secondary | ICD-10-CM | POA: Diagnosis not present

## 2023-10-20 DIAGNOSIS — Z09 Encounter for follow-up examination after completed treatment for conditions other than malignant neoplasm: Secondary | ICD-10-CM | POA: Diagnosis not present

## 2023-10-20 DIAGNOSIS — S81801A Unspecified open wound, right lower leg, initial encounter: Secondary | ICD-10-CM | POA: Diagnosis not present

## 2023-10-20 DIAGNOSIS — L97521 Non-pressure chronic ulcer of other part of left foot limited to breakdown of skin: Secondary | ICD-10-CM | POA: Diagnosis not present

## 2023-10-20 DIAGNOSIS — N1831 Chronic kidney disease, stage 3a: Secondary | ICD-10-CM | POA: Diagnosis not present

## 2023-10-20 NOTE — Patient Instructions (Signed)
 Medication Instructions:  Your physician recommends that you continue on your current medications as directed. Please refer to the Current Medication list given to you today.   Follow-Up: Please follow up in 6 months with Dr. Veryl Gottron, Slater Duncan, NP or Neomi Banks, NP

## 2023-10-20 NOTE — Progress Notes (Signed)
  Cardiology Office Note:  .   Date:  10/20/2023  ID:  Steve Jensen, DOB 05/12/1941, MRN [id number] PCP: Glena Landau, MD  [facility] Providers Cardiologist:  Sheryle Donning, MD {  History of Present Illness: Steve Jensen   Steve Jensen is a 83 y.o. male longstanding persistent atrial fibrillation, chronic diastolic heart failure, ckd 3a, history of hepatocellular carcinoma and prostate cancer, type II diabetes, and OSA. He was previously seen by Dr. Jacquelynn Matter and established care with me on 10/20/23.  Pertinent CV history: Admitted 08/2023 after a fall at home, concern for syncope. Concern was as to whether his meds may have caused severe pause/bradycardia, diltiazem  was stopped, and a monitor was placed. I can see that monitor was ordered but I cannot see results.  Never got monitor placed, mailed it back unopened. Went to rehab and then assisted living, has not had a recurrent syncope/fall episode.  Initial BP low today, asymptomatic. Has intermittent readings from his facility, lowest 108/54, most 120s/70s. Only on lasix  and metoprolol . Recheck BP improved.  ROS: Denies chest pain, shortness of breath at rest or with normal exertion. No PND, orthopnea, LE edema or unexpected weight gain. No syncope or palpitations. ROS otherwise negative except as noted.   Studies Reviewed: Steve Jensen    EKG:       Physical Exam:   VS:  BP (!) 108/58   Pulse 100   Ht 5\' 9"  (1.753 m)   Wt 285 lb 14.4 oz (129.7 kg)   SpO2 94%   BMI 42.22 kg/m    Wt Readings from Last 3 Encounters:  10/20/23 285 lb 14.4 oz (129.7 kg)  10/05/23 275 lb (124.7 kg)  09/04/23 282 lb 13.6 oz (128.3 kg)    GEN: Well nourished, well developed in no acute distress HEENT: Normal, moist mucous membranes NECK: No JVD CARDIAC: irregularly irregular rhythm, normal S1 and S2, no rubs or gallops. No murmur. VASCULAR: Radial and DP pulses 2+ bilaterally. No carotid bruits RESPIRATORY:  Clear to auscultation without rales,  wheezing or rhonchi  ABDOMEN: Soft, non-tender, non-distended MUSCULOSKELETAL:  Ambulates independently SKIN: Warm and dry, bilateral 1+ LE edema NEUROLOGIC:  Alert and oriented x 3. No focal neuro deficits noted. PSYCHIATRIC:  Normal affect    ASSESSMENT AND PLAN: .    Recent hospitalization for fall vs. Syncope -no recurrent events. Did not complete monitor -echo unremarkable  Longstanding persistent vs. Permanent atrial fibrillation -CHA2DS2/VAS Stroke Risk Points=5  -asymptomatic -continue metoprolol  succinate 100 mg daily for rate control  Chronic diastolic heart failure Chronic kidney disease stage 3a -continue lasix  40 mg in the AM and 20 mg in the PM  Hyperlipidemia -continue ezetimibe   OSA -follows with Dr. Micael Adas  Dispo: 6 mos or sooner as needed  Signed, Sheryle Donning, MD   Sheryle Donning, MD, PhD, [facility] [place]  [facility]  [place]  [facility] at [facility] at [facility] [address] [place], [place] [address] [phone]

## 2023-10-21 DIAGNOSIS — D649 Anemia, unspecified: Secondary | ICD-10-CM | POA: Diagnosis not present

## 2023-10-21 DIAGNOSIS — I5030 Unspecified diastolic (congestive) heart failure: Secondary | ICD-10-CM | POA: Diagnosis not present

## 2023-10-21 DIAGNOSIS — M549 Dorsalgia, unspecified: Secondary | ICD-10-CM | POA: Diagnosis not present

## 2023-10-21 DIAGNOSIS — I10 Essential (primary) hypertension: Secondary | ICD-10-CM | POA: Diagnosis not present

## 2023-10-21 DIAGNOSIS — I4891 Unspecified atrial fibrillation: Secondary | ICD-10-CM | POA: Diagnosis not present

## 2023-10-21 DIAGNOSIS — E785 Hyperlipidemia, unspecified: Secondary | ICD-10-CM | POA: Diagnosis not present

## 2023-10-21 DIAGNOSIS — M545 Low back pain, unspecified: Secondary | ICD-10-CM | POA: Diagnosis not present

## 2023-10-21 DIAGNOSIS — K219 Gastro-esophageal reflux disease without esophagitis: Secondary | ICD-10-CM | POA: Diagnosis not present

## 2023-10-21 DIAGNOSIS — R5381 Other malaise: Secondary | ICD-10-CM | POA: Diagnosis not present

## 2023-10-21 DIAGNOSIS — G4733 Obstructive sleep apnea (adult) (pediatric): Secondary | ICD-10-CM | POA: Diagnosis not present

## 2023-10-21 DIAGNOSIS — N189 Chronic kidney disease, unspecified: Secondary | ICD-10-CM | POA: Diagnosis not present

## 2023-10-21 DIAGNOSIS — K5900 Constipation, unspecified: Secondary | ICD-10-CM | POA: Diagnosis not present

## 2023-10-21 DIAGNOSIS — R296 Repeated falls: Secondary | ICD-10-CM | POA: Diagnosis not present

## 2023-10-21 DIAGNOSIS — L0390 Cellulitis, unspecified: Secondary | ICD-10-CM | POA: Diagnosis not present

## 2023-10-23 DIAGNOSIS — I10 Essential (primary) hypertension: Secondary | ICD-10-CM | POA: Diagnosis not present

## 2023-10-23 DIAGNOSIS — M545 Low back pain, unspecified: Secondary | ICD-10-CM | POA: Diagnosis not present

## 2023-10-23 DIAGNOSIS — E119 Type 2 diabetes mellitus without complications: Secondary | ICD-10-CM | POA: Diagnosis not present

## 2023-10-24 ENCOUNTER — Other Ambulatory Visit: Payer: Self-pay

## 2023-10-24 ENCOUNTER — Inpatient Hospital Stay (HOSPITAL_COMMUNITY)

## 2023-10-24 ENCOUNTER — Inpatient Hospital Stay (HOSPITAL_COMMUNITY)
Admission: EM | Admit: 2023-10-24 | Discharge: 2023-10-30 | DRG: 315 | Disposition: A | Discharge status: Skilled Nursing Facility | Source: Skilled Nursing Facility | Attending: Internal Medicine | Admitting: Internal Medicine

## 2023-10-24 ENCOUNTER — Encounter (HOSPITAL_COMMUNITY): Payer: Self-pay

## 2023-10-24 ENCOUNTER — Emergency Department (HOSPITAL_COMMUNITY)

## 2023-10-24 DIAGNOSIS — Z8249 Family history of ischemic heart disease and other diseases of the circulatory system: Secondary | ICD-10-CM

## 2023-10-24 DIAGNOSIS — R6510 Systemic inflammatory response syndrome (SIRS) of non-infectious origin without acute organ dysfunction: Secondary | ICD-10-CM | POA: Diagnosis present

## 2023-10-24 DIAGNOSIS — I959 Hypotension, unspecified: Secondary | ICD-10-CM | POA: Diagnosis not present

## 2023-10-24 DIAGNOSIS — Z6841 Body Mass Index (BMI) 40.0 and over, adult: Secondary | ICD-10-CM

## 2023-10-24 DIAGNOSIS — Z66 Do not resuscitate: Secondary | ICD-10-CM | POA: Diagnosis not present

## 2023-10-24 DIAGNOSIS — E785 Hyperlipidemia, unspecified: Secondary | ICD-10-CM | POA: Diagnosis present

## 2023-10-24 DIAGNOSIS — Z9181 History of falling: Secondary | ICD-10-CM

## 2023-10-24 DIAGNOSIS — I4819 Other persistent atrial fibrillation: Secondary | ICD-10-CM | POA: Diagnosis not present

## 2023-10-24 DIAGNOSIS — E1165 Type 2 diabetes mellitus with hyperglycemia: Principal | ICD-10-CM | POA: Diagnosis present

## 2023-10-24 DIAGNOSIS — Z604 Social exclusion and rejection: Secondary | ICD-10-CM | POA: Diagnosis not present

## 2023-10-24 DIAGNOSIS — R4182 Altered mental status, unspecified: Secondary | ICD-10-CM | POA: Diagnosis not present

## 2023-10-24 DIAGNOSIS — Z8042 Family history of malignant neoplasm of prostate: Secondary | ICD-10-CM

## 2023-10-24 DIAGNOSIS — D72829 Elevated white blood cell count, unspecified: Secondary | ICD-10-CM | POA: Diagnosis present

## 2023-10-24 DIAGNOSIS — I499 Cardiac arrhythmia, unspecified: Secondary | ICD-10-CM | POA: Diagnosis not present

## 2023-10-24 DIAGNOSIS — I110 Hypertensive heart disease with heart failure: Secondary | ICD-10-CM | POA: Diagnosis not present

## 2023-10-24 DIAGNOSIS — I130 Hypertensive heart and chronic kidney disease with heart failure and stage 1 through stage 4 chronic kidney disease, or unspecified chronic kidney disease: Secondary | ICD-10-CM | POA: Diagnosis not present

## 2023-10-24 DIAGNOSIS — Z9981 Dependence on supplemental oxygen: Secondary | ICD-10-CM | POA: Diagnosis not present

## 2023-10-24 DIAGNOSIS — Z833 Family history of diabetes mellitus: Secondary | ICD-10-CM

## 2023-10-24 DIAGNOSIS — R55 Syncope and collapse: Secondary | ICD-10-CM | POA: Diagnosis not present

## 2023-10-24 DIAGNOSIS — I480 Paroxysmal atrial fibrillation: Secondary | ICD-10-CM | POA: Diagnosis present

## 2023-10-24 DIAGNOSIS — Z888 Allergy status to other drugs, medicaments and biological substances status: Secondary | ICD-10-CM

## 2023-10-24 DIAGNOSIS — K5909 Other constipation: Secondary | ICD-10-CM | POA: Diagnosis not present

## 2023-10-24 DIAGNOSIS — G4733 Obstructive sleep apnea (adult) (pediatric): Secondary | ICD-10-CM | POA: Diagnosis present

## 2023-10-24 DIAGNOSIS — E876 Hypokalemia: Secondary | ICD-10-CM | POA: Diagnosis not present

## 2023-10-24 DIAGNOSIS — S3992XA Unspecified injury of lower back, initial encounter: Secondary | ICD-10-CM | POA: Diagnosis not present

## 2023-10-24 DIAGNOSIS — W1830XA Fall on same level, unspecified, initial encounter: Secondary | ICD-10-CM | POA: Diagnosis present

## 2023-10-24 DIAGNOSIS — I509 Heart failure, unspecified: Principal | ICD-10-CM

## 2023-10-24 DIAGNOSIS — Y92129 Unspecified place in nursing home as the place of occurrence of the external cause: Secondary | ICD-10-CM

## 2023-10-24 DIAGNOSIS — E1122 Type 2 diabetes mellitus with diabetic chronic kidney disease: Secondary | ICD-10-CM | POA: Diagnosis not present

## 2023-10-24 DIAGNOSIS — R0600 Dyspnea, unspecified: Secondary | ICD-10-CM | POA: Diagnosis not present

## 2023-10-24 DIAGNOSIS — I5032 Chronic diastolic (congestive) heart failure: Secondary | ICD-10-CM | POA: Diagnosis present

## 2023-10-24 DIAGNOSIS — K219 Gastro-esophageal reflux disease without esophagitis: Secondary | ICD-10-CM | POA: Diagnosis not present

## 2023-10-24 DIAGNOSIS — Z8546 Personal history of malignant neoplasm of prostate: Secondary | ICD-10-CM

## 2023-10-24 DIAGNOSIS — D649 Anemia, unspecified: Secondary | ICD-10-CM | POA: Diagnosis not present

## 2023-10-24 DIAGNOSIS — E1142 Type 2 diabetes mellitus with diabetic polyneuropathy: Secondary | ICD-10-CM | POA: Diagnosis present

## 2023-10-24 DIAGNOSIS — W19XXXD Unspecified fall, subsequent encounter: Secondary | ICD-10-CM | POA: Diagnosis not present

## 2023-10-24 DIAGNOSIS — E875 Hyperkalemia: Secondary | ICD-10-CM | POA: Diagnosis not present

## 2023-10-24 DIAGNOSIS — M6281 Muscle weakness (generalized): Secondary | ICD-10-CM | POA: Diagnosis not present

## 2023-10-24 DIAGNOSIS — Z79899 Other long term (current) drug therapy: Secondary | ICD-10-CM

## 2023-10-24 DIAGNOSIS — E66813 Obesity, class 3: Secondary | ICD-10-CM | POA: Diagnosis present

## 2023-10-24 DIAGNOSIS — Z9842 Cataract extraction status, left eye: Secondary | ICD-10-CM

## 2023-10-24 DIAGNOSIS — R197 Diarrhea, unspecified: Secondary | ICD-10-CM | POA: Diagnosis not present

## 2023-10-24 DIAGNOSIS — I7143 Infrarenal abdominal aortic aneurysm, without rupture: Secondary | ICD-10-CM | POA: Diagnosis not present

## 2023-10-24 DIAGNOSIS — Z743 Need for continuous supervision: Secondary | ICD-10-CM | POA: Diagnosis not present

## 2023-10-24 DIAGNOSIS — N179 Acute kidney failure, unspecified: Secondary | ICD-10-CM | POA: Diagnosis present

## 2023-10-24 DIAGNOSIS — I482 Chronic atrial fibrillation, unspecified: Secondary | ICD-10-CM | POA: Diagnosis not present

## 2023-10-24 DIAGNOSIS — R0902 Hypoxemia: Secondary | ICD-10-CM | POA: Diagnosis present

## 2023-10-24 DIAGNOSIS — A419 Sepsis, unspecified organism: Secondary | ICD-10-CM | POA: Diagnosis present

## 2023-10-24 DIAGNOSIS — R0602 Shortness of breath: Secondary | ICD-10-CM | POA: Diagnosis not present

## 2023-10-24 DIAGNOSIS — Z8619 Personal history of other infectious and parasitic diseases: Secondary | ICD-10-CM

## 2023-10-24 DIAGNOSIS — R278 Other lack of coordination: Secondary | ICD-10-CM | POA: Diagnosis not present

## 2023-10-24 DIAGNOSIS — Z7901 Long term (current) use of anticoagulants: Secondary | ICD-10-CM

## 2023-10-24 DIAGNOSIS — G3184 Mild cognitive impairment, so stated: Secondary | ICD-10-CM | POA: Diagnosis not present

## 2023-10-24 DIAGNOSIS — E8721 Acute metabolic acidosis: Secondary | ICD-10-CM | POA: Insufficient documentation

## 2023-10-24 DIAGNOSIS — M4856XA Collapsed vertebra, not elsewhere classified, lumbar region, initial encounter for fracture: Secondary | ICD-10-CM | POA: Diagnosis not present

## 2023-10-24 DIAGNOSIS — S32040D Wedge compression fracture of fourth lumbar vertebra, subsequent encounter for fracture with routine healing: Secondary | ICD-10-CM | POA: Diagnosis not present

## 2023-10-24 DIAGNOSIS — Z7984 Long term (current) use of oral hypoglycemic drugs: Secondary | ICD-10-CM

## 2023-10-24 DIAGNOSIS — R404 Transient alteration of awareness: Secondary | ICD-10-CM | POA: Diagnosis not present

## 2023-10-24 DIAGNOSIS — E872 Acidosis, unspecified: Secondary | ICD-10-CM | POA: Diagnosis present

## 2023-10-24 DIAGNOSIS — Z8505 Personal history of malignant neoplasm of liver: Secondary | ICD-10-CM

## 2023-10-24 DIAGNOSIS — I4821 Permanent atrial fibrillation: Secondary | ICD-10-CM | POA: Diagnosis not present

## 2023-10-24 DIAGNOSIS — Z794 Long term (current) use of insulin: Secondary | ICD-10-CM

## 2023-10-24 DIAGNOSIS — M47816 Spondylosis without myelopathy or radiculopathy, lumbar region: Secondary | ICD-10-CM | POA: Diagnosis present

## 2023-10-24 DIAGNOSIS — I4811 Longstanding persistent atrial fibrillation: Secondary | ICD-10-CM | POA: Diagnosis present

## 2023-10-24 DIAGNOSIS — I517 Cardiomegaly: Secondary | ICD-10-CM | POA: Diagnosis not present

## 2023-10-24 DIAGNOSIS — Z9889 Other specified postprocedural states: Secondary | ICD-10-CM

## 2023-10-24 DIAGNOSIS — R0989 Other specified symptoms and signs involving the circulatory and respiratory systems: Secondary | ICD-10-CM | POA: Diagnosis not present

## 2023-10-24 DIAGNOSIS — S32040A Wedge compression fracture of fourth lumbar vertebra, initial encounter for closed fracture: Secondary | ICD-10-CM | POA: Diagnosis present

## 2023-10-24 DIAGNOSIS — Z881 Allergy status to other antibiotic agents status: Secondary | ICD-10-CM

## 2023-10-24 DIAGNOSIS — Z7401 Bed confinement status: Secondary | ICD-10-CM | POA: Diagnosis not present

## 2023-10-24 DIAGNOSIS — R092 Respiratory arrest: Secondary | ICD-10-CM | POA: Diagnosis not present

## 2023-10-24 DIAGNOSIS — Z87891 Personal history of nicotine dependence: Secondary | ICD-10-CM

## 2023-10-24 DIAGNOSIS — R2681 Unsteadiness on feet: Secondary | ICD-10-CM | POA: Diagnosis not present

## 2023-10-24 DIAGNOSIS — K567 Ileus, unspecified: Secondary | ICD-10-CM | POA: Diagnosis not present

## 2023-10-24 DIAGNOSIS — R1314 Dysphagia, pharyngoesophageal phase: Secondary | ICD-10-CM | POA: Diagnosis not present

## 2023-10-24 DIAGNOSIS — Z823 Family history of stroke: Secondary | ICD-10-CM

## 2023-10-24 DIAGNOSIS — I10 Essential (primary) hypertension: Secondary | ICD-10-CM | POA: Diagnosis present

## 2023-10-24 DIAGNOSIS — Z741 Need for assistance with personal care: Secondary | ICD-10-CM | POA: Diagnosis not present

## 2023-10-24 DIAGNOSIS — Z8052 Family history of malignant neoplasm of bladder: Secondary | ICD-10-CM

## 2023-10-24 DIAGNOSIS — N1831 Chronic kidney disease, stage 3a: Secondary | ICD-10-CM | POA: Diagnosis not present

## 2023-10-24 DIAGNOSIS — Z9089 Acquired absence of other organs: Secondary | ICD-10-CM

## 2023-10-24 DIAGNOSIS — L0390 Cellulitis, unspecified: Secondary | ICD-10-CM | POA: Diagnosis not present

## 2023-10-24 DIAGNOSIS — J309 Allergic rhinitis, unspecified: Secondary | ICD-10-CM | POA: Diagnosis not present

## 2023-10-24 DIAGNOSIS — Z9841 Cataract extraction status, right eye: Secondary | ICD-10-CM

## 2023-10-24 LAB — GLUCOSE, CAPILLARY: Glucose-Capillary: 102 mg/dL — ABNORMAL HIGH (ref 70–99)

## 2023-10-24 LAB — CBC WITH DIFFERENTIAL/PLATELET
Abs Immature Granulocytes: 0.11 10*3/uL — ABNORMAL HIGH (ref 0.00–0.07)
Basophils Absolute: 0.1 10*3/uL (ref 0.0–0.1)
Basophils Relative: 1 %
Eosinophils Absolute: 0.1 10*3/uL (ref 0.0–0.5)
Eosinophils Relative: 1 %
HCT: 38.5 % — ABNORMAL LOW (ref 39.0–52.0)
Hemoglobin: 12.2 g/dL — ABNORMAL LOW (ref 13.0–17.0)
Immature Granulocytes: 1 %
Lymphocytes Relative: 13 %
Lymphs Abs: 1.8 10*3/uL (ref 0.7–4.0)
MCH: 26.8 pg (ref 26.0–34.0)
MCHC: 31.7 g/dL (ref 30.0–36.0)
MCV: 84.6 fL (ref 80.0–100.0)
Monocytes Absolute: 1.7 10*3/uL — ABNORMAL HIGH (ref 0.1–1.0)
Monocytes Relative: 13 %
Neutro Abs: 9.5 10*3/uL — ABNORMAL HIGH (ref 1.7–7.7)
Neutrophils Relative %: 71 %
Platelets: 291 10*3/uL (ref 150–400)
RBC: 4.55 MIL/uL (ref 4.22–5.81)
RDW: 18.9 % — ABNORMAL HIGH (ref 11.5–15.5)
WBC: 13.2 10*3/uL — ABNORMAL HIGH (ref 4.0–10.5)
nRBC: 0 % (ref 0.0–0.2)

## 2023-10-24 LAB — LACTIC ACID, PLASMA
Lactic Acid, Venous: 3.6 mmol/L (ref 0.5–1.9)
Lactic Acid, Venous: 1.7 mmol/L (ref 0.5–1.9)
Lactic Acid, Venous: 1.5 mmol/L (ref 0.5–1.9)

## 2023-10-24 LAB — BETA-HYDROXYBUTYRIC ACID: Beta-Hydroxybutyric Acid: 0.23 mmol/L (ref 0.05–0.27)

## 2023-10-24 LAB — CBG MONITORING, ED: Glucose-Capillary: 95 mg/dL (ref 70–99)

## 2023-10-24 LAB — BASIC METABOLIC PANEL WITH GFR
Anion gap: 16 — ABNORMAL HIGH (ref 5–15)
BUN: 26 mg/dL — ABNORMAL HIGH (ref 8–23)
CO2: 22 mmol/L (ref 22–32)
Calcium: 9.1 mg/dL (ref 8.9–10.3)
Chloride: 105 mmol/L (ref 98–111)
Creatinine, Ser: 2.39 mg/dL — ABNORMAL HIGH (ref 0.61–1.24)
GFR, Estimated: 27 mL/min — ABNORMAL LOW (ref >60)
Glucose, Bld: 163 mg/dL — ABNORMAL HIGH (ref 70–99)
Potassium: 4 mmol/L (ref 3.5–5.1)
Sodium: 143 mmol/L (ref 135–145)

## 2023-10-24 LAB — URINALYSIS, W/ REFLEX TO CULTURE (INFECTION SUSPECTED)
Bacteria, UA: NONE SEEN
Bilirubin Urine: NEGATIVE
Glucose, UA: NEGATIVE mg/dL
Hgb urine dipstick: NEGATIVE
Ketones, ur: NEGATIVE mg/dL
Leukocytes,Ua: NEGATIVE
Nitrite: NEGATIVE
Protein, ur: NEGATIVE mg/dL
Specific Gravity, Urine: 1.015 (ref 1.005–1.030)
pH: 5 (ref 5.0–8.0)

## 2023-10-24 LAB — TROPONIN I (HIGH SENSITIVITY)
Troponin I (High Sensitivity): 9 ng/L (ref <18)
Troponin I (High Sensitivity): 8 ng/L (ref <18)

## 2023-10-24 LAB — MAGNESIUM: Magnesium: 1.8 mg/dL (ref 1.7–2.4)

## 2023-10-24 LAB — MRSA NEXT GEN BY PCR, NASAL: MRSA by PCR Next Gen: NOT DETECTED

## 2023-10-24 LAB — BRAIN NATRIURETIC PEPTIDE: B Natriuretic Peptide: 411.2 pg/mL — ABNORMAL HIGH (ref 0.0–100.0)

## 2023-10-24 MED ORDER — SODIUM CHLORIDE 0.9% FLUSH
3.0000 mL | Freq: Two times a day (BID) | INTRAVENOUS | Status: DC
  Administered 2023-10-24 – 2023-10-30 (×12): 3 mL via INTRAVENOUS

## 2023-10-24 MED ORDER — DILTIAZEM LOAD VIA INFUSION
15.0000 mg | Freq: Once | INTRAVENOUS | Status: DC
  Filled 2023-10-24: qty 15

## 2023-10-24 MED ORDER — FUROSEMIDE 10 MG/ML IJ SOLN: 40.0000 mg | Freq: Once | INTRAMUSCULAR | Status: DC

## 2023-10-24 MED ORDER — APIXABAN 2.5 MG PO TABS
2.5000 mg | ORAL_TABLET | Freq: Two times a day (BID) | ORAL | Status: DC
  Administered 2023-10-24 – 2023-10-30 (×12): 2.5 mg via ORAL
  Filled 2023-10-24 (×12): qty 1

## 2023-10-24 MED ORDER — DILTIAZEM HCL-DEXTROSE 125-5 MG/125ML-% IV SOLN (PREMIX): 5.0000 mg/h | INTRAVENOUS | Status: DC

## 2023-10-24 MED ORDER — ACETAMINOPHEN 325 MG PO TABS
650.0000 mg | ORAL_TABLET | Freq: Four times a day (QID) | ORAL | Status: DC | PRN
  Administered 2023-10-25 – 2023-10-29 (×5): 650 mg via ORAL
  Filled 2023-10-24 (×5): qty 2

## 2023-10-24 MED ORDER — VANCOMYCIN HCL 1500 MG/300ML IV SOLN
1500.0000 mg | INTRAVENOUS | Status: DC
  Administered 2023-10-24: 1500 mg via INTRAVENOUS
  Filled 2023-10-24: qty 300

## 2023-10-24 MED ORDER — SODIUM CHLORIDE 0.9 % IV SOLN: Freq: Once | INTRAVENOUS | Status: AC

## 2023-10-24 MED ORDER — FUROSEMIDE 10 MG/ML IJ SOLN
20.0000 mg | Freq: Two times a day (BID) | INTRAMUSCULAR | Status: AC
  Administered 2023-10-24 – 2023-10-25 (×2): 20 mg via INTRAVENOUS
  Filled 2023-10-24 (×2): qty 2

## 2023-10-24 MED ORDER — PANTOPRAZOLE SODIUM 40 MG IV SOLR
40.0000 mg | Freq: Two times a day (BID) | INTRAVENOUS | Status: DC
  Administered 2023-10-24 – 2023-10-25 (×3): 40 mg via INTRAVENOUS
  Filled 2023-10-24 (×3): qty 10

## 2023-10-24 MED ORDER — ACETAMINOPHEN 650 MG RE SUPP: 650.0000 mg | Freq: Four times a day (QID) | RECTAL | Status: DC | PRN

## 2023-10-24 MED ORDER — FLUOXETINE HCL 10 MG PO CAPS
10.0000 mg | ORAL_CAPSULE | Freq: Every evening | ORAL | Status: DC
  Administered 2023-10-24 – 2023-10-30 (×7): 10 mg via ORAL
  Filled 2023-10-24 (×7): qty 1

## 2023-10-24 MED ORDER — INSULIN ASPART 100 UNIT/ML IJ SOLN
0.0000 [IU] | Freq: Three times a day (TID) | INTRAMUSCULAR | Status: DC
  Administered 2023-10-25 – 2023-10-26 (×3): 2 [IU] via SUBCUTANEOUS
  Administered 2023-10-26: 3 [IU] via SUBCUTANEOUS
  Administered 2023-10-26 – 2023-10-27 (×2): 2 [IU] via SUBCUTANEOUS
  Administered 2023-10-27 – 2023-10-28 (×3): 3 [IU] via SUBCUTANEOUS
  Administered 2023-10-28: 5 [IU] via SUBCUTANEOUS
  Administered 2023-10-28 – 2023-10-29 (×2): 3 [IU] via SUBCUTANEOUS
  Administered 2023-10-29: 5 [IU] via SUBCUTANEOUS
  Administered 2023-10-29: 3 [IU] via SUBCUTANEOUS
  Administered 2023-10-30: 2 [IU] via SUBCUTANEOUS
  Administered 2023-10-30: 3 [IU] via SUBCUTANEOUS
  Administered 2023-10-30: 2 [IU] via SUBCUTANEOUS

## 2023-10-24 MED ORDER — POLYETHYLENE GLYCOL 3350 17 G PO PACK: 17.0000 g | PACK | Freq: Every day | ORAL | Status: DC | PRN

## 2023-10-24 MED ORDER — SODIUM CHLORIDE 0.9 % IV SOLN
2.0000 g | INTRAVENOUS | Status: DC
  Administered 2023-10-24 – 2023-10-25 (×2): 2 g via INTRAVENOUS
  Filled 2023-10-24 (×2): qty 20

## 2023-10-24 NOTE — H&P (Signed)
 History and Physical    Patient: Steve Jensen ZOX:[id number] DOB: [birth year] DOA: 10/24/2023 DOS: the patient was seen and examined on 10/24/2023 PCP: Glena Landau, MD  Patient coming from: Home Chief complaint: Chief Complaint  Patient presents with   Shortness of Breath   HPI:  Steve Jensen is a 83 y.o. male with past medical history  of  A-fib on Eliquis , hypertension, chronic HFpEF, OSA on CPAP, obesity, type 2 diabetes, CKD stage IIIa, GERD, hepatocellular carcinoma seen by IR, history of prostate cancer, falls, nursing facility with complaints of shortness of breath.  Patient was 88% on room air upon EMS arrival.  Patient was also found to be in A-fib RVR and was given 400 mL of IV fluids and heart rate noted to have improved to 128 in A-fib. Patient was last hospitalized in the April 2025 for fall at home concern for syncope and concern for bradycardia and diltiazem  during that admission was stopped and patient had a Holter monitor placed. Chart review shows that patient was last seen by cardiology on 20 Oct 2023, patient was continued on metoprolol  100 mg daily along with Lasix  40 mg in the morning and 20 at night continued on Zetia .  The patient's longstanding persistent versus permanent A-fib with a CHA2DS2-VASc score of 5, patient did not complete his Holter monitor.  Patient was continued on metoprolol .  Per daughter in law ms beverly on phone @7 :22Pm: She received called and dad was trying to up and walk using walker and was very weak. It took 3 staff members  to manage him and he was disoriented.  At baseline he is oriented  and his BP there was 80/50. BP has been low this week and was seen by cardiology and was also low and if asymptomatic no changes. On Wednesday at 2 AM he fell. He fell backwards and landed on his bottom.  Made an ortho appt for low back pain but this am he was weak and confused.  Today he poured his meds in his cereal.   ED Course: Pt in ed at  bedside  is alert awake cooperative oriented to self and location but not to the year states that 1972. Vital signs in the ED were notable for the following:  Vitals:   10/24/23 1645 10/24/23 1715 10/24/23 1716 10/24/23 1922  BP: 128/62 119/77    Pulse: 98 (!) 103  95  Temp:   98.8 F (37.1 C) 98.7 F (37.1 C)  Resp:    20  Height:    5\' 9"  (1.753 m)  Weight:    128 kg  SpO2: 95% 93%  91%  TempSrc:   Oral Oral  BMI (Calculated):    41.65  >>ED evaluation thus far shows: -Requested head CT. -BMP showed AKI with a creatinine of 2.39, EGFR of 27 anion gap of 16, normal bicarb of 22 glucose 163 LFTs ordered and pending. -Lactic acid of 3.6.  Blood cultures and urine cultures ordered and pending. -Lasix  held. -Elevated BNP at 411.2. -Troponin ordered and pending. -Beta hydroxy 0.23. -Urinalysis ordered and pending. -Reached out to nurse to collect additional blood work that is been added on including the lactic troponin per nurses report patient was found sitting on the floor.  Head CT is still pending. -Echo 09/04/2023:  1. Left ventricular ejection fraction, by estimation, is 55 to 60%. The  left ventricle has normal function. The left ventricle has no regional  wall motion abnormalities. Left ventricular diastolic function could  not  be evaluated.   2. Right ventricular systolic function is mildly reduced. The right  ventricular size is mildly enlarged. Tricuspid regurgitation signal is  inadequate for assessing PA pressure.   3. Left atrial size was mildly dilated.   4. The mitral valve is normal in structure. Trivial mitral valve  regurgitation. No evidence of mitral stenosis.   5. The aortic valve is calcified. There is moderate calcification of the  aortic valve. There is mild thickening of the aortic valve. Aortic valve  regurgitation is mild. Aortic valve sclerosis/calcification is present,  without any evidence of aortic  stenosis.   6. The inferior vena cava is normal in  size with greater than 50%  respiratory variability, suggesting right atrial pressure of 3 mmHg.   >>While in the ED patient received the following: Medications  pantoprazole  (PROTONIX ) injection 40 mg (40 mg Intravenous Given 10/24/23 1558)  insulin  aspart (novoLOG ) injection 0-15 Units (has no administration in time range)   Review of Systems  Unable to perform ROS: Other (Disorientation)   Past Medical History:  Diagnosis Date   Anemia    Diabetes mellitus type II, controlled (HCC)    with neuropathy   Dysrhythmia    A-Fib. cardioversion done   GERD (gastroesophageal reflux disease)    Hyperlipidemia    Hypertension    Morbid obesity (HCC)    Neuromuscular disorder (HCC)    feet   OSA (obstructive sleep apnea)    On BiPAP at 15/11cm H2O   Prostate cancer (HCC)    Shingles    on face Nov 2010   Urinary incontinence    Past Surgical History:  Procedure Laterality Date   CARDIOVERSION N/A 09/06/2013   Procedure: CARDIOVERSION;  Surgeon: Lucendia Rusk, MD;  Location: [facility];  Service: Cardiovascular;  Laterality: N/A;   EYE SURGERY     cataract surgery bilat; surgery to correct droopy eyelids    FLEXIBLE SIGMOIDOSCOPY N/A 12/24/2021   Procedure: FLEXIBLE SIGMOIDOSCOPY;  Surgeon: Urban Garden, MD;  Location: [facility];  Service: Gastroenterology;  Laterality: N/A;   IR ANGIOGRAM SELECTIVE EACH ADDITIONAL VESSEL  02/15/2018   IR ANGIOGRAM SELECTIVE EACH ADDITIONAL VESSEL  02/15/2018   IR ANGIOGRAM SELECTIVE EACH ADDITIONAL VESSEL  02/15/2018   IR ANGIOGRAM SELECTIVE EACH ADDITIONAL VESSEL  02/15/2018   IR ANGIOGRAM SELECTIVE EACH ADDITIONAL VESSEL  02/15/2018   IR ANGIOGRAM VISCERAL SELECTIVE  02/15/2018   IR ANGIOGRAM VISCERAL SELECTIVE  02/15/2018   IR EMBO TUMOR ORGAN ISCHEMIA INFARCT INC GUIDE ROADMAPPING  02/15/2018   IR RADIOLOGIST EVAL & MGMT  01/26/2018   IR RADIOLOGIST EVAL & MGMT  03/17/2018   IR RADIOLOGIST EVAL & MGMT  06/29/2018   IR  RADIOLOGIST EVAL & MGMT  07/27/2018   IR RADIOLOGIST EVAL & MGMT  10/28/2018   IR RADIOLOGIST EVAL & MGMT  12/14/2018   IR RADIOLOGIST EVAL & MGMT  03/17/2019   IR RADIOLOGIST EVAL & MGMT  07/14/2019   IR RADIOLOGIST EVAL & MGMT  12/27/2019   IR RADIOLOGIST EVAL & MGMT  08/21/2020   IR RADIOLOGIST EVAL & MGMT  02/28/2021   IR RADIOLOGIST EVAL & MGMT  06/25/2021   IR RADIOLOGIST EVAL & MGMT  10/18/2021   IR US  GUIDE VASC ACCESS RIGHT  02/15/2018   knee surgery bilat      PROSTATE SURGERY     RADIOLOGY WITH ANESTHESIA N/A 11/24/2018   Procedure: MICROWAVE THERMAL ABLATION LIVER;  Surgeon: Robbi Childs, MD;  Location: [facility];  Service: Anesthesiology;  Laterality: N/A;   right shoulder rotator cuff surgery     TONSILLECTOMY      reports that he quit smoking about 48 years ago. His smoking use included cigarettes. He started smoking about 63 years ago. He has a 15 pack-year smoking history. He has never used smokeless tobacco. He reports that he does not drink alcohol and does not use drugs. Allergies  Allergen Reactions   Lipitor [Atorvastatin] Other (See Comments)    Myalgias   Zithromax [Azithromycin] Other (See Comments)    GI intolerance   Zocor [Simvastatin] Other (See Comments)    Myalgias    Norvasc  [Amlodipine ] Swelling   Family History  Problem Relation Age of Onset   Hypertension Brother    Diabetes Brother    Cancer Brother        bladder cancer   Diabetes Brother    CVA Brother    Hypertension Brother    Prostate cancer Brother    Diabetes Brother    Hypertension Brother    Heart disease Brother        CABG   Heart attack Brother    Stroke Brother    Prior to Admission medications   Medication Sig Start Date End Date Taking? Authorizing Provider  allopurinol  (ZYLOPRIM ) 100 MG tablet Take 100 mg by mouth daily. 04/17/21   [provider]  apixaban  (ELIQUIS ) 5 MG TABS tablet TAKE 1 TABLET(5 MG) BY MOUTH TWICE DAILY Patient taking differently: Take 5 mg by mouth 2  (two) times daily. 11/15/18   Jacqueline Matsu, MD  azelastine (ASTELIN) 0.1 % nasal spray Place 1 spray into both nostrils 2 (two) times daily. Use in each nostril as directed    [provider]  benzonatate  (TESSALON ) 100 MG capsule Take 100 mg by mouth 3 (three) times daily as needed. 06/08/23   [provider]  cholecalciferol (VITAMIN D3) 25 MCG (1000 UNIT) tablet Take 2,000 Units by mouth daily.    [provider]  ezetimibe  (ZETIA ) 10 MG tablet Take 10 mg by mouth daily.    [provider]  ferrous sulfate 325 (65 FE) MG tablet Take 325 mg by mouth daily with breakfast.    [provider]  FLUoxetine  (PROZAC ) 10 MG capsule Take 10 mg by mouth every evening. 04/14/23   [provider]  furosemide  (LASIX ) 20 MG tablet Take 20 mg by mouth every evening.    [provider]  furosemide  (LASIX ) 40 MG tablet Take 40 mg by mouth daily.    [provider]  guaiFENesin  (MUCINEX ) 600 MG 12 hr tablet Take 600 mg by mouth 2 (two) times daily as needed for cough or to loosen phlegm.    [provider]  insulin  aspart (NOVOLOG ) 100 UNIT/ML injection Inject 100 Units into the skin 3 (three) times daily before meals. Sliding scale    [provider]  Insulin  Glargine-Lixisenatide (SOLIQUA) 100-33 UNT-MCG/ML SOPN Inject 22 Units into the skin daily.    [provider]  leptospermum manuka honey (MEDIHONEY) PSTE paste Apply 1 Application topically daily. Monday and Thursday    [provider]  melatonin 5 MG TABS Take 5 mg by mouth at bedtime. 11/17/22   [provider]  metFORMIN  (GLUCOPHAGE ) 1000 MG tablet Take 1,000 mg by mouth 2 (two) times daily with a meal. 06/27/14   [provider]  metoprolol  succinate (TOPROL -XL) 100 MG 24 hr tablet Take 1 tablet (100 mg total) by mouth  daily. Take with or immediately following a meal. 09/09/23   Maylene Spear, MD  Multiple Vitamins-Minerals  (MULTIVITAMIN MEN 50+) TABS Take 1 tablet by mouth every evening.    [provider]  Omega-3 Fatty Acids (FISH OIL PO) Take 1 capsule by mouth 2 (two) times daily.    [provider]  omeprazole  (PRILOSEC) 20 MG capsule Take 1 capsule (20 mg total) by mouth daily. 11/15/22   Johnson, Clanford L, MD  polyethylene glycol (MIRALAX  / GLYCOLAX ) 17 g packet Take 17 g by mouth daily as needed for moderate constipation.    [provider]  potassium chloride  SA (KLOR-CON  M) 10 MEQ tablet Take 1 tablet (10 mEq total) by mouth daily. 09/05/23   Krishnan, Gokul, MD  pregabalin  (LYRICA ) 100 MG capsule Take 1 capsule (100 mg total) by mouth 2 (two) times daily. 09/08/23   Krishnan, Gokul, MD  senna (SENOKOT) 8.6 MG TABS tablet Take 1 tablet by mouth in the morning and at bedtime.    [provider]  sulfamethoxazole-trimethoprim (BACTRIM DS) 800-160 MG tablet Take 1 tablet by mouth 2 (two) times daily.    [provider]                                                                                 Vitals:   10/24/23 1645 10/24/23 1715 10/24/23 1716 10/24/23 1922  BP: 128/62 119/77    Pulse: 98 (!) 103  95  Resp:    20  Temp:   98.8 F (37.1 C) 98.7 F (37.1 C)  TempSrc:   Oral Oral  SpO2: 95% 93%  91%  Weight:    128 kg  Height:    5\' 9"  (1.753 m)   Physical Exam Vitals and nursing note reviewed.  Constitutional:      General: He is not in acute distress. HENT:     Head: Normocephalic and atraumatic.     Right Ear: Hearing normal.     Left Ear: Hearing normal.     Nose: No nasal deformity.     Mouth/Throat:     Lips: Pink.  Eyes:     General: Lids are normal.     Extraocular Movements: Extraocular movements intact.  Cardiovascular:     Rate and Rhythm: Normal rate. Rhythm irregular.     Heart sounds: Normal heart sounds.  Pulmonary:     Effort: Pulmonary effort is normal.     Breath sounds: Normal breath sounds.  Abdominal:     General:  Bowel sounds are normal. There is no distension.     Palpations: Abdomen is soft. There is no mass.     Tenderness: There is no abdominal tenderness.  Musculoskeletal:     Right lower leg: No edema.     Left lower leg: No edema.  Skin:    General: Skin is warm.  Neurological:     General: No focal deficit present.     Mental Status: He is alert and oriented to person, place, and time.     Cranial Nerves: Cranial nerves 2-12 are intact.  Psychiatric:        Speech: Speech normal.     Labs on  Admission: I have personally reviewed following labs and imaging studies CBC: Recent Labs  Lab 10/24/23 1123  WBC 13.2*  NEUTROABS 9.5*  HGB 12.2*  HCT 38.5*  MCV 84.6  PLT 291   Basic Metabolic Panel: Recent Labs  Lab 10/24/23 1123  NA 143  K 4.0  CL 105  CO2 22  GLUCOSE 163*  BUN 26*  CREATININE 2.39*  CALCIUM  9.1  MG 1.8   GFR: Estimated Creatinine Clearance: 32.1 mL/min (A) (by C-G formula based on SCr of 2.39 mg/dL (H)). Liver Function Tests: No results for input(s): "AST", "ALT", "ALKPHOS", "BILITOT", "PROT", "ALBUMIN " in the last 168 hours. No results for input(s): "LIPASE", "AMYLASE" in the last 168 hours. No results for input(s): "AMMONIA" in the last 168 hours. Coagulation Profile: No results for input(s): "INR", "PROTIME" in the last 168 hours. Cardiac Enzymes: No results for input(s): "CKTOTAL", "CKMB", "CKMBINDEX", "TROPONINI" in the last 168 hours. BNP (last 3 results) No results for input(s): "PROBNP" in the last 8760 hours. HbA1C: No results for input(s): "HGBA1C" in the last 72 hours. CBG: Recent Labs  Lab 10/24/23 1715  GLUCAP 95   Lipid Profile: No results for input(s): "CHOL", "HDL", "LDLCALC", "TRIG", "CHOLHDL", "LDLDIRECT" in the last 72 hours. Thyroid  Function Tests: No results for input(s): "TSH", "T4TOTAL", "FREET4", "T3FREE", "THYROIDAB" in the last 72 hours. Anemia Panel: No results for input(s): "VITAMINB12", "FOLATE", "FERRITIN",  "TIBC", "IRON", "RETICCTPCT" in the last 72 hours. Urine analysis:    Component Value Date/Time   COLORURINE YELLOW 09/04/2023 0345   APPEARANCEUR CLEAR 09/04/2023 0345   LABSPEC 1.015 09/04/2023 0345   PHURINE 5.0 09/04/2023 0345   GLUCOSEU NEGATIVE 09/04/2023 0345   HGBUR NEGATIVE 09/04/2023 0345   BILIRUBINUR NEGATIVE 09/04/2023 0345   KETONESUR NEGATIVE 09/04/2023 0345   PROTEINUR NEGATIVE 09/04/2023 0345   NITRITE NEGATIVE 09/04/2023 0345   LEUKOCYTESUR NEGATIVE 09/04/2023 0345   Radiological Exams on Admission: CT Lumbar Spine Wo Contrast Result Date: 10/24/2023 EXAM: CT OF THE LUMBAR SPINE WITHOUT CONTRAST 10/24/2023 05:53:58 PM TECHNIQUE: CT of the lumbar spine was performed without the administration of intravenous contrast. Multiplanar reformatted images are provided for review. Automated exposure control, iterative reconstruction, and/or weight based adjustment of the mA/kV was utilized to reduce the radiation dose to as low as reasonably achievable. COMPARISON: Abdominal CT dated 12/23/2021. CLINICAL HISTORY: Back trauma, no prior imaging (Age >= 16y). Chief complaints; Shortness of Breath; CT Head Wo Contrast; Mental status change, unknown cause; CT Lumbar Spine Wo Contrast; Back trauma, no prior imaging (Age >= 16y) FINDINGS: BONES AND ALIGNMENT: Diminutive ribs at T12 with partial sacralization of the L5 vertebral segment. Acute compression fracture of the L4 vertebral body with 3 mm retropulsion of the superior endplate and mild height loss. Linear sclerosis underlying the L1 superior endplate, suggestive of subacute to chronic compression fracture with minimal height loss. Moderate degenerative changes of the bilateral sacroiliac joints. DEGENERATIVE CHANGES: Multilevel lumbar spondylosis, most significant at L2-3, where there is at least mild spinal canal stenosis. SOFT TISSUES: Atherosclerotic calcifications of the abdominal aorta and its branches. Focal ectasia of the infrarenal  aorta, measuring up to 3.2 cm on axial image 54 series 5. According to the guideline on Abdominal Aortic Aneurysms (AAA), the finding is consistent with a focal ectasia of the infrarenal aorta measuring 3.2 cm, which falls under the category of a small aneurysm. The recommendation is to follow up with imaging surveillance every 3 years, as per the SVS 2018 guidelines for aneurysms measuring 3.03.9 cm.  IMPRESSION: 1. Acute compression fracture of the L4 vertebral body with 3 mm retropulsion of the superior endplate and mild height loss. 2. Linear sclerosis underlying the L1 superior endplate, suggestive of subacute to chronic compression fracture with minimal height loss. 3. Multilevel lumbar spondylosis, most significant at L2-3, where there is at least mild spinal canal stenosis. 4. Focal ectasia of the infrarenal aorta, measuring up to 3.2 cm, consistent with a small aneurysm. Follow-up imaging surveillance every 3 years is recommended, as per the SVS 2018 guidelines for aneurysms measuring 3.03.9 cm. Electronically signed by: Audra Blend MD 10/24/2023 06:30 PM EDT RP Workstation: [id number]   CT Head Wo Contrast Result Date: 10/24/2023 CLINICAL DATA:  Mental status change, unknown cause EXAM: CT HEAD WITHOUT CONTRAST TECHNIQUE: Contiguous axial images were obtained from the base of the skull through the vertex without intravenous contrast. RADIATION DOSE REDUCTION: This exam was performed according to the departmental dose-optimization program which includes automated exposure control, adjustment of the mA and/or kV according to patient size and/or use of iterative reconstruction technique. COMPARISON:  CT head 09/03/2023 FINDINGS: Brain: Patchy and confluent areas of decreased attenuation are noted throughout the deep and periventricular white matter of the cerebral hemispheres bilaterally, compatible with chronic microvascular ischemic disease. No evidence of large-territorial acute infarction. No  parenchymal hemorrhage. No mass lesion. No extra-axial collection. No mass effect or midline shift. No hydrocephalus. Basilar cisterns are patent. Vascular: No hyperdense vessel. Skull: No acute fracture or focal lesion. Sinuses/Orbits: Paranasal sinuses and mastoid air cells are clear. Bilateral lens replacement. Otherwise the orbits are unremarkable. Other: None. IMPRESSION: No acute intracranial abnormality. Electronically Signed   By: Morgane  Naveau M.D.   On: 10/24/2023 18:22   DG Chest 2 View Result Date: 10/24/2023 CLINICAL DATA:  Shortness of breath EXAM: CHEST - 2 VIEW COMPARISON:  Chest x-ray performed September 03, 2023 FINDINGS: Low lung volumes with enlarged heart central pulmonary vascular congestion. No significant pleural effusion. No pneumothorax. Osteopenia. Chronic bony deformities. IMPRESSION: 1. Enlarged heart with central pulmonary vascular congestion. 2. No significant pleural effusion. Electronically Signed   By: Reagan Camera M.D.   On: 10/24/2023 12:28   Data Reviewed: Relevant notes from primary care and specialist visits, past discharge summaries as available in EHR, including Care Everywhere. Prior diagnostic testing as pertinent to current admission diagnoses, Updated medications and problem lists for reconciliation ED course, including vitals, labs, imaging, treatment and response to treatment,Triage notes, nursing and pharmacy notes and ED provider's notes Notable results as noted in HPI.Discussed case with EDMD/ ED APP/ or Specialty MD on call and as needed.  Assessment & Plan  >> AMS/ transient disorientation: Based on chart review suspect patient's altered mental status to be more of a transient origin most likely from either infection or hypotension or medication related.  Another consideration is TIA as patient has high risk for VTE and strokes.Mri brain ordered.    >>Severe sepsis with organ dysfunction: Patient meets severe sepsis criteria with vitals white count  lactic acid AKI.  Source unclear. Will start patient on empiric IV antibiotics once blood and urine cultures are collected.   >>Essential Hypertension: Vitals:   10/24/23 1117 10/24/23 1130 10/24/23 1300 10/24/23 1330  BP: 124/62 (!) 127/107 108/67 99/60   10/24/23 1345 10/24/23 1352 10/24/23 1624 10/24/23 1630  BP: 104/69 110/79 123/70 (!) 112/53   10/24/23 1645 10/24/23 1715  BP: 128/62 119/77  Hold his BP meds.    >>AKI: Lab Results  Component Value Date  CREATININE 2.39 (H) 10/24/2023   CREATININE 1.35 (H) 09/08/2023   CREATININE 1.47 (H) 09/07/2023  Avoid contrast and renally dose needed medications. HOLD BACTRIM . Foley after bladder scan today.    >>Falls/ Acute compression fracture of the L4 : Fall precautions, PT prior to discharge.  Head CT noncontrast negative for any acute intracranial abnormalities. Patient complaining of low back pain to ED MD CT lumbar spine done in the emergency room today shows an acute compression fracture of the L4 with 3 mm retropulsion of the superior endplate and mild height loss, multilevel lumbar spondylosis, subacute to chronic compression fractures, focal ectasia infrarenal aorta up to 3.2 cm consistent with a small aneurysm with recommendations for surveillance every 3 years. D/W NS on call and recommended LSO brace and outpatient followup.   >>Anemia: New, mild chart review shows patient had a remote history of anemia in 2023 has been stable since June 2024.  Current hemoglobin is mildly low at 12.2 which could be from dilution or phlebotomy. Will monitor type and screen.   >> C/H HFpEF: Echo as above : EF 55-60 %. Stable .  Home regimen includes Lasix , metoprolol  100 mg.  Midodrine low dose started.   >>DM II: Glycemic protocol. Hold home insulin  regimen.   >>PAF: Cont eliquis  at reduced dose. Pharm to modify.  Metoprolol  held with due to soft BP. CHA2DS2/VAS Stroke Risk Points  Current as of 9 minutes ago     5 >= 2  Points: High Risk  1 to 1.99 Points: Medium Risk  0 Points: Low Risk    Last Change: N/A      >>Lactic acidosis: D/d include metformin  related, sepsis related.  Cont with gentle MIVF.     DVT prophylaxis:  Eliquis .  Consults:  None.  Advance Care Planning:    Code Status: Full Code   Family Communication:  Daughter in Social worker.  Disposition Plan:  Assisted living facility X since last discharge.  Severity of Illness: The appropriate patient status for this patient is INPATIENT. Inpatient status is judged to be reasonable and necessary in order to provide the required intensity of service to ensure the patient's safety. The patient's presenting symptoms, physical exam findings, and initial radiographic and laboratory data in the context of their chronic comorbidities is felt to place them at high risk for further clinical deterioration. Furthermore, it is not anticipated that the patient will be medically stable for discharge from the hospital within 2 midnights of admission.   * I certify that at the point of admission it is my clinical judgment that the patient will require inpatient hospital care spanning beyond 2 midnights from the point of admission due to high intensity of service, high risk for further deterioration and high frequency of surveillance required.*  Unresulted Labs (From admission, onward)     Start     Ordered   10/24/23 1451  Culture, blood (Routine X 2) w Reflex to ID Panel  BLOOD CULTURE X 2,   R (with STAT occurrences)      10/24/23 1450   10/24/23 1451  Urinalysis, w/ Reflex to Culture (Infection Suspected) -Urine, Random  (Urine Labs)  Once,   URGENT       Question:  Specimen Source  Answer:  Urine, Random   10/24/23 1450   10/24/23 1449  Lactic acid, plasma  (Lactic Acid)  STAT Now then every 3 hours,   R (with STAT occurrences)      10/24/23 1448   10/24/23 1123  Urinalysis, Routine w reflex microscopic -Urine, Clean Catch  Once,   URGENT       Question:   Specimen Source  Answer:  Urine, Clean Catch   10/24/23 1122            Orders Placed This Encounter  Procedures   Culture, blood (Routine X 2) w Reflex to ID Panel   Urine Culture (for pregnant, neutropenic or urologic patients or patients with an indwelling urinary catheter)   MRSA Next Gen by PCR, Nasal   DG Chest 2 View   CT Head Wo Contrast   CT Lumbar Spine Wo Contrast   MR BRAIN WO CONTRAST   Basic metabolic panel   CBC with Differential   Brain natriuretic peptide   Magnesium    Urinalysis, Routine w reflex microscopic -Urine, Clean Catch   Lactic acid, plasma   Beta-hydroxybutyric acid   Urinalysis, w/ Reflex to Culture (Infection Suspected) -Urine, Random   Lactic acid, plasma   Diet heart healthy/carb modified Room service appropriate? Yes; Fluid consistency: Thin; Fluid restriction: 1500 mL Fluid   ED Cardiac monitoring   Apply Diabetes Mellitus Care Plan   STAT CBG when hypoglycemia is suspected. If treated, recheck every 15 minutes after each treatment until CBG >/= 70 mg/dl   Refer to Hypoglycemia Protocol Sidebar Report for treatment of CBG < 70 mg/dl   No HS correction Insulin    Notify physician (specify)   Initiate Heart Failure Care Plan   Daily weights   Strict intake and output   In and Out Cath   Patient Education:   Apply Heart Failure Care Plan   [facility] and AP only) Obtain REDS clips reading Every morning   Maintain IV access   Vital signs   Notify physician (specify)   Mobility Protocol: No Restrictions RN to initiate protocols based on patient's level of care   Refer to Sidebar Report Refer to ICU, Med-Surg, Progressive, and Step-Down Mobility Protocol Sidebars   Initiate Adult Central Line Maintenance and Catheter Protocol for patients with central line (CVC, PICC, Port, Hemodialysis, Trialysis)   If patient diabetic or glucose greater than 140 notify physician for Sliding Scale Insulin  Orders   Do not place and if present remove PureWick    Initiate Oral Care Protocol   Initiate Carrier Fluid Protocol   RN may order General Admission PRN Orders utilizing "General Admission PRN medications" (through manage orders) for the following patient needs: allergy symptoms (Claritin ), cold sores (Carmex), cough (Robitussin DM), eye irritation (Liquifilm Tears), hemorrhoids (Tucks), indigestion (Maalox), minor skin irritation (Hydrocortisone Cream), muscle pain (Ben Gay), nose irritation (saline nasal spray) and sore throat (Chloraseptic spray).   Cardiac Monitoring - Continuous Indefinite   Swallow screen   Outside Vendor Brace   Full code   Consult to hospitalist   Inpatient consult to Cardiology Consult Timeframe: ROUTINE - requires response within 24 hours; Reason for Consult? CHF Already called   vancomycin per pharmacy consult   PT eval and treat   CPAP   Pulse oximetry check with vital signs   Oxygen  therapy Mode or (Route): Nasal cannula; Liters Per Minute: 2; Keep O2 saturation between: greater than 92 %   Incentive spirometry   CBG monitoring, ED   EKG 12-Lead   EKG 12-Lead   Insert peripheral IV   Admit to Inpatient (patient's expected length of stay will be greater than 2 midnights or inpatient only procedure)   Aspiration precautions   Fall precautions    Author: Alcide Humble  Lydia Sams, MD 12 pm -8 pm. [facility] 10/24/2023 7:34 PM >>Please note for any concern,or critical results after hours past 8pm please contact the [facility] floor coverage provider from 7 PM- 7 AM. For on call review [email or web address], username [email or web address] and PW: your phone number<<

## 2023-10-24 NOTE — ED Triage Notes (Signed)
 Pt BIB [facility] from [facility] d/t SOB. Pt was sitting at 88% RA upon EMS arrival. Pt was also in AFIB RVR w HX AFIB and CHF .  EMS given 400 ml fluids which brought the HR down.   BP 107/65  HR 120 afib

## 2023-10-24 NOTE — ED Notes (Signed)
 Pt was found sitting on the floor by nursing staff. Pt denied hitting his head. No injuries noted. MD made aware.

## 2023-10-24 NOTE — ED Provider Notes (Signed)
 Drain EMERGENCY DEPARTMENT AT [facility] Provider Note   CSN: [id number] Arrival date & time: 10/24/23  1107     History  Chief Complaint  Patient presents with   Shortness of Breath    Steve Jensen is a 83 y.o. male w/ hx of A-fib on Eliquis , HTN, chronic CHF with preserved EF, OSA on CPAP, DM2, obesity, CKD 3A, hepatocellular carcinoma, GERD, prostate cancer, presenting from [facility] with SOB.  Reporting 88% on room air.  Patient denies SOB, cough, fever, chest pain, or any other issue. EMS gave 400 cc fluids for A Fib Rvr elevated HR, HR 120's afterwards, Bpm 107/65.   The patient's son and daughter-in-law by phone reported the patient seen much more confused this week, particular the past 2 days, that he was dumping his medications into his cereal.  He was confusing names.  This that is unusual for him.  They do report he had a mechanical witnessed fall a few days ago but denied that there was when his head injury.  HPI     Home Medications Prior to Admission medications   Medication Sig Start Date End Date Taking? Authorizing Provider  allopurinol  (ZYLOPRIM ) 100 MG tablet Take 100 mg by mouth daily. 04/17/21   [provider]  apixaban  (ELIQUIS ) 5 MG TABS tablet TAKE 1 TABLET(5 MG) BY MOUTH TWICE DAILY Patient taking differently: Take 5 mg by mouth 2 (two) times daily. 11/15/18   Jacqueline Matsu, MD  azelastine (ASTELIN) 0.1 % nasal spray Place 1 spray into both nostrils 2 (two) times daily. Use in each nostril as directed    [provider]  benzonatate  (TESSALON ) 100 MG capsule Take 100 mg by mouth 3 (three) times daily as needed. 06/08/23   [provider]  cholecalciferol (VITAMIN D3) 25 MCG (1000 UNIT) tablet Take 2,000 Units by mouth daily.    [provider]  ezetimibe  (ZETIA ) 10 MG tablet Take 10 mg by mouth daily.    [provider]  ferrous sulfate 325 (65 FE) MG tablet Take 325 mg by mouth daily  with breakfast.    [provider]  FLUoxetine  (PROZAC ) 10 MG capsule Take 10 mg by mouth every evening. 04/14/23   [provider]  furosemide  (LASIX ) 20 MG tablet Take 20 mg by mouth every evening.    [provider]  furosemide  (LASIX ) 40 MG tablet Take 40 mg by mouth daily.    [provider]  guaiFENesin  (MUCINEX ) 600 MG 12 hr tablet Take 600 mg by mouth 2 (two) times daily as needed for cough or to loosen phlegm.    [provider]  insulin  aspart (NOVOLOG ) 100 UNIT/ML injection Inject 100 Units into the skin 3 (three) times daily before meals. Sliding scale    [provider]  Insulin  Glargine-Lixisenatide (SOLIQUA) 100-33 UNT-MCG/ML SOPN Inject 22 Units into the skin daily.    [provider]  leptospermum manuka honey (MEDIHONEY) PSTE paste Apply 1 Application topically daily. Monday and Thursday    [provider]  melatonin 5 MG TABS Take 5 mg by mouth at bedtime. 11/17/22   [provider]  metFORMIN  (GLUCOPHAGE ) 1000 MG tablet Take 1,000 mg by mouth 2 (two) times daily with a meal. 06/27/14   [provider]  metoprolol  succinate (TOPROL -XL) 100 MG 24 hr tablet Take 1 tablet (100 mg total) by mouth daily. Take with or immediately following a meal. 09/09/23   Maylene Spear, MD  Multiple Vitamins-Minerals (  MULTIVITAMIN MEN 50+) TABS Take 1 tablet by mouth every evening.    [provider]  Omega-3 Fatty Acids (FISH OIL PO) Take 1 capsule by mouth 2 (two) times daily.    [provider]  omeprazole  (PRILOSEC) 20 MG capsule Take 1 capsule (20 mg total) by mouth daily. 11/15/22   Johnson, Clanford L, MD  polyethylene glycol (MIRALAX  / GLYCOLAX ) 17 g packet Take 17 g by mouth daily as needed for moderate constipation.    [provider]  potassium chloride  SA (KLOR-CON  M) 10 MEQ tablet Take 1 tablet (10 mEq total) by mouth daily. 09/05/23   Krishnan, Gokul, MD  pregabalin  (LYRICA )  100 MG capsule Take 1 capsule (100 mg total) by mouth 2 (two) times daily. 09/08/23   Krishnan, Gokul, MD  senna (SENOKOT) 8.6 MG TABS tablet Take 1 tablet by mouth in the morning and at bedtime.    [provider]  sulfamethoxazole-trimethoprim (BACTRIM DS) 800-160 MG tablet Take 1 tablet by mouth 2 (two) times daily.    [provider]      Allergies    Lipitor [atorvastatin], Zithromax [azithromycin], Zocor [simvastatin], and Norvasc  [amlodipine ]    Review of Systems   Review of Systems  Physical Exam Updated Vital Signs BP 123/70   Pulse 100   Temp 98.3 F (36.8 C) (Oral)   Resp 20   SpO2 98%  Physical Exam Constitutional:      General: He is not in acute distress. HENT:     Head: Normocephalic and atraumatic.  Eyes:     Conjunctiva/sclera: Conjunctivae normal.     Pupils: Pupils are equal, round, and reactive to light.  Cardiovascular:     Rate and Rhythm: Tachycardia present. Rhythm irregular.  Pulmonary:     Effort: Pulmonary effort is normal. No respiratory distress.  Abdominal:     General: There is no distension.     Tenderness: There is no abdominal tenderness.  Skin:    General: Skin is warm and dry.  Neurological:     General: No focal deficit present.     Mental Status: He is alert and oriented to person, place, and time. Mental status is at baseline.  Psychiatric:        Mood and Affect: Mood normal.        Behavior: Behavior normal.     ED Results / Procedures / Treatments   Labs (all labs ordered are listed, but only abnormal results are displayed) Labs Reviewed  BASIC METABOLIC PANEL WITH GFR - Abnormal; Notable for the following components:      Result Value   Glucose, Bld 163 (*)    BUN 26 (*)    Creatinine, Ser 2.39 (*)    GFR, Estimated 27 (*)    Anion gap 16 (*)    All other components within normal limits  CBC WITH DIFFERENTIAL/PLATELET - Abnormal; Notable for the following components:   WBC 13.2 (*)    Hemoglobin  12.2 (*)    HCT 38.5 (*)    RDW 18.9 (*)    Neutro Abs 9.5 (*)    Monocytes Absolute 1.7 (*)    Abs Immature Granulocytes 0.11 (*)    All other components within normal limits  BRAIN NATRIURETIC PEPTIDE - Abnormal; Notable for the following components:   B Natriuretic Peptide 411.2 (*)    All other components within normal limits  LACTIC ACID, PLASMA - Abnormal; Notable for the following components:   Lactic Acid, Venous 3.6 (*)  All other components within normal limits  CULTURE, BLOOD (ROUTINE X 2)  CULTURE, BLOOD (ROUTINE X 2)  MAGNESIUM   BETA-HYDROXYBUTYRIC ACID  URINALYSIS, ROUTINE W REFLEX MICROSCOPIC  LACTIC ACID, PLASMA  URINALYSIS, W/ REFLEX TO CULTURE (INFECTION SUSPECTED)  TROPONIN I (HIGH SENSITIVITY)  TROPONIN I (HIGH SENSITIVITY)    EKG EKG Interpretation Date/Time:  Saturday Oct 24 2023 11:18:20 EDT Ventricular Rate:  104 PR Interval:    QRS Duration:  97 QT Interval:  358 QTC Calculation: 471 R Axis:   84  Text Interpretation: Atrial fibrillation Borderline right axis deviation Confirmed by Jerald Molly [phone]) on 10/24/2023 11:21:34 AM  Radiology DG Chest 2 View Result Date: 10/24/2023 CLINICAL DATA:  Shortness of breath EXAM: CHEST - 2 VIEW COMPARISON:  Chest x-ray performed September 03, 2023 FINDINGS: Low lung volumes with enlarged heart central pulmonary vascular congestion. No significant pleural effusion. No pneumothorax. Osteopenia. Chronic bony deformities. IMPRESSION: 1. Enlarged heart with central pulmonary vascular congestion. 2. No significant pleural effusion. Electronically Signed   By: Reagan Camera M.D.   On: 10/24/2023 12:28    Procedures Procedures    Medications Ordered in ED Medications  pantoprazole  (PROTONIX ) injection 40 mg (40 mg Intravenous Given 10/24/23 1558)  insulin  aspart (novoLOG ) injection 0-15 Units (has no administration in time range)    ED Course/ Medical Decision Making/ A&P Clinical Course as of 10/24/23 1641   Sat Oct 24, 2023  1356 Heart rate has improved into the 90s while at rest. 90% O2 at rest.  Will amb pulse ox and check urine. [MT]  1500 Admitted to hospitalist dr patel who requested we hold the lasix  given concern for hypotension and worsening kidney function.  Broadened workup to include infection coverage.  HR remains 90's in A Fib.  Patient's family updated by phone.  Pending CT imaging on admission [MT]    Clinical Course User Index [MT] Kamariah Fruchter, Janalyn Me, MD                                 Medical Decision Making Amount and/or Complexity of Data Reviewed Labs: ordered. Radiology: ordered.  Risk Decision regarding hospitalization.   This patient presents to the ED with concern for shortness of breath or hypoxia. This involves an extensive number of treatment options, and is a complaint that carries with it a high risk of complications and morbidity.  The differential diagnosis includes A fib rvr vs congestive heart failure vs PNA vs pleural effusion vs other  Co-morbidities that complicate the patient evaluation: hx of CHF and A FIB  Additional history obtained from EMS  I ordered and personally interpreted labs.  The pertinent results include: White blood cell count 12.2.  BMP with some minor elevation of BUN and creatinine.  BNP elevated at 411.  UA was pending.  I ordered imaging studies including x-ray of the chest I independently visualized and interpreted imaging which showed no acute infiltrate I agree with the radiologist interpretation  The patient was maintained on a cardiac monitor.  I personally viewed and interpreted the cardiac monitored which showed an underlying rhythm of: A-fib, initially RVR then rate controlled  Per my interpretation the patient's ECG shows A-fib with RVR  Initially had ordered diltiazem  but the patient's heart rate appeared to improve on its own.  I had ordered Lasix  but in discussion with the hospitalist this was discontinued.  I have  reviewed the patients home medicines and  have made adjustments as needed  Test Considered: Low suspicion for acute PE.  Discussion with the patient's family we have opted to admit the patient to the hospital for altered mental status or confusion ongoing for a few days.  There was report of a fall from standing a few days ago but no evidence of head trauma or report of this.  We are however ordered and pending CT imaging of the head and lumbar spine.  Patient does not have a headache.  I did not have immediate concern for sepsis on presentation as the patient was not febrile and had no clear source of infection.  However we are awaiting a urinalysis.  Hospitalist has ordered further workup including lactate and blood cultures following admission.  Disposition:  After consideration of the diagnostic results and the patients response to treatment, I feel that the patent would benefit from medical admission.         Final Clinical Impression(s) / ED Diagnoses Final diagnoses:  Acute on chronic congestive heart failure, unspecified heart failure type (HCC)  Altered mental status, unspecified altered mental status type    Rx / DC Orders ED Discharge Orders     None         Arvilla Birmingham, MD 10/24/23 [phone]

## 2023-10-24 NOTE — ED Notes (Signed)
 Pt's O2 maintained at 95% RA while ambulating. MD made aware.

## 2023-10-24 NOTE — Progress Notes (Signed)
 Pharmacy Antibiotic Note  Steve Jensen is a 83 y.o. male admitted on 10/24/2023 with sepsis.  Pharmacy has been consulted for vancomycin dosing. Cr up from baseline at 2.39.   Plan: Vancomycin 1500mg  IV q48h Follow Cr, may need level prior to redosing  Height: 5\' 9"  (175.3 cm) Weight: 128 kg (282 lb 3 oz) IBW/kg (Calculated) : 70.7  Temp (24hrs), Avg:98.6 F (37 C), Min:98.3 F (36.8 C), [place]:98.8 F (37.1 C)  Recent Labs  Lab 10/24/23 1123 10/24/23 1555  WBC 13.2*  --   CREATININE 2.39*  --   LATICACIDVEN  --  3.6*    Estimated Creatinine Clearance: 32.1 mL/min (A) (by C-G formula based on SCr of 2.39 mg/dL (H)).    Allergies  Allergen Reactions   Lipitor [Atorvastatin] Other (See Comments)    Myalgias   Zithromax [Azithromycin] Other (See Comments)    GI intolerance   Zocor [Simvastatin] Other (See Comments)    Myalgias    Norvasc  [Amlodipine ] Swelling     Levin Reamer, PharmD, BCPS, [facility] Clinical Pharmacist [phone] Please check AMION for all [facility] numbers 10/24/2023

## 2023-10-25 DIAGNOSIS — R404 Transient alteration of awareness: Secondary | ICD-10-CM | POA: Diagnosis not present

## 2023-10-25 LAB — GLUCOSE, CAPILLARY
Glucose-Capillary: 100 mg/dL — ABNORMAL HIGH (ref 70–99)
Glucose-Capillary: 132 mg/dL — ABNORMAL HIGH (ref 70–99)
Glucose-Capillary: 147 mg/dL — ABNORMAL HIGH (ref 70–99)
Glucose-Capillary: 170 mg/dL — ABNORMAL HIGH (ref 70–99)

## 2023-10-25 LAB — URINE CULTURE: Culture: <10000 — AB

## 2023-10-25 LAB — BASIC METABOLIC PANEL WITH GFR
Anion gap: 13 (ref 5–15)
BUN: 22 mg/dL (ref 8–23)
CO2: 22 mmol/L (ref 22–32)
Calcium: 8.6 mg/dL — ABNORMAL LOW (ref 8.9–10.3)
Chloride: 107 mmol/L (ref 98–111)
Creatinine, Ser: 2 mg/dL — ABNORMAL HIGH (ref 0.61–1.24)
GFR, Estimated: 33 mL/min — ABNORMAL LOW (ref >60)
Glucose, Bld: 99 mg/dL (ref 70–99)
Potassium: 3.5 mmol/L (ref 3.5–5.1)
Sodium: 142 mmol/L (ref 135–145)

## 2023-10-25 MED ORDER — METOPROLOL SUCCINATE ER 100 MG PO TB24
100.0000 mg | ORAL_TABLET | Freq: Every day | ORAL | Status: DC
  Administered 2023-10-25 – 2023-10-30 (×6): 100 mg via ORAL
  Filled 2023-10-25 (×6): qty 1

## 2023-10-25 NOTE — Evaluation (Signed)
 Physical Therapy Evaluation Patient Details Name: Steve Jensen MRN: [id number] DOB: 11/02/1940 Today's Date: 10/25/2023  History of Present Illness  83  y.o. male presents to [facility] due [facility]B and found to be in a-fib RVR. Pt also with fall last Wednesday, head CT negative and MRI pending. AMS w/ suspected sepsis. Lumbar spine CT showed acute compression of L4 fx w/ 3 mm retropulsion. Prior admit 4/10 due to fall. PMH: a-fib, HTN, chronic HF-EF, OSA on CPAP, DM II, CKD III, GERD, hepatocellular carcinoma, prostate cancer.   Clinical Impression  Pt in bed upon arrival with family present. Pt was unable to answer questions or follow commands in today's session. Per family, pt was independent for mobility and ADLs prior to fall last week. After the fall, pt was needing assistance for all mobility and ADLs 2/2 pain and weakness. In today's session, pt would spontaneously move B LE's, however, not to command. No initiation with attempt to roll with TotalA anticipated. Recommending post-acute rehab <3hrs to work towards independence with mobility. Pt would benefit from acute skilled PT with current functional limitations listed below (see PT Problem List). Acute PT to follow.         If plan is discharge home, recommend the following: Help with stairs or ramp for entrance;Assist for transportation;Assistance with cooking/housework;Two people to help with walking and/or transfers;A lot of help with bathing/dressing/bathroom   Can travel by private vehicle   No    Equipment Recommendations None recommended by PT     Functional Status Assessment Patient has had a recent decline in their functional status and demonstrates the ability to make significant improvements in function in a reasonable and predictable amount of time.     Precautions / Restrictions Precautions Precautions: Back;Fall Precaution Booklet Issued: No Recall of Precautions/Restrictions:  Impaired Required Braces or Orthoses: Spinal Brace Spinal Brace: Lumbar corset Restrictions Weight Bearing Restrictions Per Provider Order: No      Mobility  Bed Mobility Overal bed mobility: Needs Assistance Bed Mobility: Rolling Rolling: Total assist     General bed mobility comments: No initiation with rolling or mobility, Total assistance    Transfers  General transfer comment: Deferred 2/2 safety      Balance Overall balance assessment: History of Falls       Pertinent Vitals/Pain Pain Assessment Pain Assessment: PAINAD Breathing: normal Negative Vocalization: none Facial Expression: smiling or inexpressive Body Language: relaxed Consolability: no need to console PAINAD Score: 0    Home Living Family/patient expects to be discharged to:: Assisted living    Home Equipment: Standard Walker;Rollator (4 wheels);Cane - single point;BSC/3in1;Grab bars - tub/shower;Shower seat;Lift chair;Other (comment) Water quality scientist style lift) Additional Comments: After finishing rehab, pt moved into ALF    Prior Function Prior Level of Function : Needs assist;History of Falls (last six months)    Mobility Comments: Prior to most recent fall, pt was independent with mobility with a RW. After fall, pt was needing help to get out of bed and was having difficulty taking steps ADLs Comments: family reports pt was independent for ADLs prior to most recent fall. After fall, pt was needing assist from staff for dressing, bathing, and pericare. Assist for iADLs including meal prep, cleaning, laundry, finances, and transportation     Extremity/Trunk Assessment   Upper Extremity Assessment Upper Extremity Assessment: Defer to OT evaluation    Lower Extremity Assessment Lower Extremity Assessment: Difficult to assess due to impaired cognition;Generalized weakness (Spontaneous movement in B LE.  Family reports history of peripheral neuropathy in B feet)    Cervical / Trunk Assessment Cervical /  Trunk Assessment: Kyphotic  Communication   Communication Communication: Impaired Factors Affecting Communication: Difficulty expressing self (No attempt to vocalize)    Cognition Arousal: Lethargic Behavior During Therapy: Flat affect   PT - Cognitive impairments: Orientation, Awareness, Memory, Attention, Initiation, Sequencing, Problem solving, Safety/Judgement   Orientation impairments: Person, Place, Time, Situation  PT - Cognition Comments: Pt would spontaneously open eyes and would occasionally track family moving in the room. No attempt to make eye contact or respond to questions. Was able to slightly squeeze L hand to command Following commands: Impaired Following commands impaired: Follows one step commands inconsistently     Cueing Cueing Techniques: Verbal cues, Tactile cues, Visual cues, Gestural cues     General Comments General comments (skin integrity, edema, etc.): Son and daughter present and supportive during eval. LE edema     PT Assessment Patient needs continued PT services  PT Problem List Decreased strength;Decreased mobility;Decreased balance;Decreased activity tolerance;Decreased coordination;Decreased cognition;Decreased knowledge of use of DME;Decreased safety awareness;Decreased knowledge of precautions       PT Treatment Interventions DME instruction;Therapeutic exercise;Gait training;Balance training;Functional mobility training;Therapeutic activities;Patient/family education;Neuromuscular re-education    PT Goals (Current goals can be found in the Care Plan section)  Acute Rehab PT Goals Patient Stated Goal: pt unable to state goal PT Goal Formulation: Patient unable to participate in goal setting Time For Goal Achievement: 11/08/23 Potential to Achieve Goals: Fair    Frequency Min 2X/week        AM-PAC PT "6 Clicks" Mobility  Outcome Measure Help needed turning from your back to your side while in a flat bed without using bedrails?:  Total Help needed moving from lying on your back to sitting on the side of a flat bed without using bedrails?: Total Help needed moving to and from a bed to a chair (including a wheelchair)?: Total Help needed standing up from a chair using your arms (e.g., wheelchair or bedside chair)?: Total Help needed to walk in hospital room?: Total Help needed climbing 3-5 steps with a railing? : Total 6 Click Score: 6    End of Session   Activity Tolerance: Other (comment);Patient limited by lethargy (cognition) Patient left: in bed;with call bell/phone within reach;with bed alarm set;with family/visitor present Nurse Communication: Mobility status;Other (comment) (need for air mattress, pt not following commands) PT Visit Diagnosis: Unsteadiness on feet (R26.81);Other abnormalities of gait and mobility (R26.89);Repeated falls (R29.6);Muscle weakness (generalized) (M62.81)    Time: 8657-8469 PT Time Calculation (min) (ACUTE ONLY): 14 min   Charges:   PT Evaluation $PT Eval Low Complexity: 1 Low   PT General Charges $$ ACUTE PT VISIT: 1 Visit        Orysia Blas, PT, DPT Secure Chat Preferred  Rehab Office [phone]  Alissa April Adela Ades 10/25/2023, 12:34 PM

## 2023-10-25 NOTE — Progress Notes (Signed)
   10/25/23 1657  Assess: MEWS Score  Temp 98.5 F (36.9 C)  BP (!) 111/90  MAP (mmHg) 94  ECG Heart Rate (!) 117  Resp 18  SpO2 94 %  O2 Device Nasal Cannula  O2 Flow Rate (L/min) 4 L/min  Assess: MEWS Score  MEWS Temp 0  MEWS Systolic 0  MEWS Pulse 2  MEWS RR 0  MEWS LOC 0  MEWS Score 2  MEWS Score Color Yellow  Assess: if the MEWS score is Yellow or Red  Were vital signs accurate and taken at a resting state? Yes  Does the patient meet 2 or more of the SIRS criteria? No  MEWS guidelines implemented  Yes, yellow  Treat  MEWS Interventions Considered administering scheduled or prn medications/treatments as ordered  Take Vital Signs  Increase Vital Sign Frequency  Yellow: Q2hr x1, continue Q4hrs until patient remains green for 12hrs  Escalate  MEWS: Escalate Yellow: Discuss with charge nurse and consider notifying provider and/or RRT  Notify: Charge Nurse/RN  Name of Charge Nurse/RN Notified Sarah, RN  Provider Notification  Provider Name/Title Dr. Efrain Grant  Date Provider Notified 10/25/23  Time Provider Notified 1658  Method of Notification Page  Notification Reason Change in status  Provider response See new orders  Assess: SIRS CRITERIA  SIRS Temperature  0  SIRS Respirations  0  SIRS Pulse 1  SIRS WBC 0  SIRS Score Sum  1

## 2023-10-25 NOTE — Progress Notes (Signed)
   10/25/23 2010  BiPAP/CPAP/SIPAP  Reason BIPAP/CPAP not in use Non-compliant   Pt states he does not wear a CPAP for nighttime use.

## 2023-10-25 NOTE — Progress Notes (Addendum)
 PROGRESS NOTE  Steve Jensen GNF:[id number] DOB: 10/09/1940 DOA: 10/24/2023 PCP: Glena Landau, MD   LOS: 1 day   Brief narrative:  Steve Jensen is a 83 y.o. male with past medical history atrial fibrillation on Eliquis , hypertension, chronic heart failure with preserved ejection, OSA on CPAP, obesity, type 2 diabetes melitis, CKD stage IIIa, GERD, hepatocellular carcinoma, history of prostate cancer, falls, presented to the hospital from skilled nursing facility with complaints of shortness of breath.  Patient was noted to be hypoxic with pulse ox of 88% on room air on arrival.  He was also noted to have A-fib with RVR and received IV fluids.  Of note patient was last hospitalized in April 2025 for syncope and concerns for bradycardia.  At that time Cardizem  was discontinued and a Holter monitor was placed.  Patient was subsequently seen on 27th May 25 by cardiology and was continued on metoprolol  with Lasix  and Zetia .  Patient did not complete his Holter monitor.  This time at the skilled nursing facility patient was noted to be very weak and disoriented and blood pressure was low.  Subsequently had a fall at the skilled nursing facility.  In the ED blood pressure was better.  Labs showed creatinine of 2.3 with lactate elevation at 3.6.  BNP elevated at 411.  Review of previous 2D echocardiogram from 09/04/2023 showed LV ejection fraction of 55 to 60%.  Patient was then considered for admission to the hospital for further evaluation and treatment.    Assessment/Plan: Principal Problem:   AMS (altered mental status) Active Problems:   Severe sepsis with acute organ dysfunction (HCC)   Essential hypertension, benign   PAF (paroxysmal atrial fibrillation) (HCC)   Uncontrolled type 2 diabetes mellitus with hyperglycemia, without long-term current use of insulin  (HCC)   AKI (acute kidney injury) (HCC)   Chronic heart failure with preserved ejection fraction (HFpEF) (HCC)   SOB (shortness of  breath)  Altered mental status. Transient.  Likely secondary to hypotension.  MRI of the brain has been ordered.  Patient likely at baseline.  Alert awake and Communicative.  Hypotension fall.  Could be secondary to medication related.  Currently on hold.  Might need to adjust medications with cardiology.  Received gentle IV fluids.  Will get orthostatic vitals.   Suspected  sepsis with organ dysfunction: Patient had leukocytosis with AKI in lactic acidosis.  Lactate has improved at this time.  On empiric antibiotics.  Antihypertensives on hold.  Currently on vancomycin and Rocephin  empirically.  Blood cultures negative in less than 24 hours.  Will de-escalate antibiotic if no source of infection.  Chest x-ray without any infiltrate.  MRSA PCR negative.  Urinalysis was negative for infection.  Diarrhea.  Will get C. difficile testing..  AKI: Baseline creatinine around 1.3-1.4.  Creatinine on presentation at 2.3.  Received some IV fluids.  Hold nephrotoxic agents.  Latest creatinine at 2.0.  Will continue to monitor closely.  Intake and output charting.   Falls/ Acute compression fracture of the L4 : CT head without any acute findings.  Patient complained of lower back pain show CT of the lumbar spine showed acute compression of the L4 fracture with 3 mm retropulsion.  Neurosurgery on-call was consulted by ED who  recommended LSO brace and outpatient follow-up.  Will get orthostatic vitals.  Will get PT OT evaluation.   Anemia.   Hemoglobin of 12.2 on presentation.  Will continue to monitor CBC.    History of chronic heart failure with  preserved ejection fraction. 2D echocardiogram with LV EF 55-60 %..  Patient currently on Lasix  metoprolol  as outpatient..  Currently on hold.  Latest blood pressure of 112/ 82.  Continue intake and output charting Daily weights.    Diabetes mellitus type II. Continue sliding scale insulin .  Closely monitor blood glucose levels.    Paroxysmal atrial  fibrillation. Continue Eliquis .  Metoprolol  hold due to low blood pressure.  Lactic acidosis: Differential diagnosis include metformin  related, sepsis related.  Received gentle IV fluids.  Has improved at this time.  Class III obesity.  Would benefit from lifestyle modification and weight loss.  Body mass index is 41.74 kg/m.   DVT prophylaxis: apixaban  (ELIQUIS ) tablet 2.5 mg Start: 10/24/23 2200 apixaban  (ELIQUIS ) tablet 2.5 mg   Disposition: Skilled nursing facility in 1 to 2 days  Status is: Inpatient Remains inpatient appropriate because: IV antibiotics, pending clinical improvement    Code Status:     Code Status: Full Code  Family Communication: None at bedside  Consultants: Curbside neuro surgery  Procedures: None  Anti-infectives:  Vancomycin and Rocephin  IV  Anti-infectives (From admission, onward)    Start     Dose/Rate Route Frequency Ordered Stop   10/24/23 2045  vancomycin (VANCOREADY) IVPB 1500 mg/300 mL        1,500 mg 150 mL/hr over 120 Minutes Intravenous Every 48 hours 10/24/23 1948     10/24/23 2030  cefTRIAXone  (ROCEPHIN ) 2 g in sodium chloride  0.9 % 100 mL IVPB        2 g 200 mL/hr over 30 Minutes Intravenous Every 24 hours 10/24/23 1930 10/29/23 2014        Subjective: Today, patient was seen and examined at bedside.  Patient poor historian.  Alert awake but not quite sure why he here in the hospital.  On supplemental oxygen .  Denies any nausea, vomiting, abdominal pain.  Complains of mild lower back pain and on further interrogation stated that he did have a fall.  Objective: Vitals:   10/25/23 0507 10/25/23 0805  BP: 112/82 111/63  Pulse: 80 83  Resp: 16 17  Temp: 97.6 F (36.4 C) 97.6 F (36.4 C)  SpO2: 100% 99%    Intake/Output Summary (Last 24 hours) at 10/25/2023 1009 Last data filed at 10/25/2023 0508 Gross per 24 hour  Intake --  Output 600 ml  Net -600 ml   Filed Weights   10/24/23 1922 10/25/23 0507  Weight: 128 kg  128.2 kg   Body mass index is 41.74 kg/m.   Physical Exam:  GENERAL: Patient is alert awake and oriented to place.  Poor historian.  Not in obvious distress.  Obese built, on nasal cannula oxygen  HENT: No scleral pallor or icterus. Pupils equally reactive to light. Oral mucosa is moist NECK: is supple, no gross swelling noted. CHEST: Clear to auscultation. No crackles or wheezes.  Diminished breath sounds bilaterally. CVS: S1 and S2 heard, no murmur.  Irregular rhythm. ABDOMEN: Soft, non-tender, bowel sounds are present.  Lower back tenderness on palpation EXTREMITIES: No edema. CNS: Cranial nerves are intact.  Moves extremities. SKIN: warm and dry without rashes.  Data Review: I have personally reviewed the following laboratory data and studies,  CBC: Recent Labs  Lab 10/24/23 1123  WBC 13.2*  NEUTROABS 9.5*  HGB 12.2*  HCT 38.5*  MCV 84.6  PLT 291   Basic Metabolic Panel: Recent Labs  Lab 10/24/23 1123 10/25/23 0447  NA 143 142  K 4.0 3.5  CL 105 107  CO2 22 22  GLUCOSE 163* 99  BUN 26* 22  CREATININE 2.39* 2.00*  CALCIUM  9.1 8.6*  MG 1.8  --    Liver Function Tests: No results for input(s): "AST", "ALT", "ALKPHOS", "BILITOT", "PROT", "ALBUMIN " in the last 168 hours. No results for input(s): "LIPASE", "AMYLASE" in the last 168 hours. No results for input(s): "AMMONIA" in the last 168 hours. Cardiac Enzymes: No results for input(s): "CKTOTAL", "CKMB", "CKMBINDEX", "TROPONINI" in the last 168 hours. BNP (last 3 results) Recent Labs    11/11/22 1210 09/03/23 2103 10/24/23 1123  BNP 457.0* 152.3* 411.2*    ProBNP (last 3 results) No results for input(s): "PROBNP" in the last 8760 hours.  CBG: Recent Labs  Lab 10/24/23 1715 10/24/23 2116 10/25/23 0901  GLUCAP 95 102* 100*   Recent Results (from the past 240 hours)  Culture, blood (Routine X 2) w Reflex to ID Panel     Status: None (Preliminary result)   Collection Time: 10/24/23  2:51 PM    Specimen: BLOOD  Result Value Ref Range Status   Specimen Description BLOOD RIGHT ANTECUBITAL  Final   Special Requests   Final    BOTTLES DRAWN AEROBIC AND ANAEROBIC Blood Culture adequate volume   Culture   Final    NO GROWTH < 24 HOURS Performed at [facility], [address] [place], [place] [address]    Report Status PENDING  Incomplete  Culture, blood (Routine X 2) w Reflex to ID Panel     Status: None (Preliminary result)   Collection Time: 10/24/23  2:56 PM   Specimen: BLOOD  Result Value Ref Range Status   Specimen Description BLOOD LEFT ANTECUBITAL  Final   Special Requests   Final    BOTTLES DRAWN AEROBIC AND ANAEROBIC Blood Culture results may not be optimal due to an inadequate volume of blood received in culture bottles   Culture   Final    NO GROWTH < 24 HOURS Performed at [facility], [address] [place], [place] [address]    Report Status PENDING  Incomplete  MRSA Next Gen by PCR, Nasal     Status: None   Collection Time: 10/24/23 10:23 PM   Specimen: Urine, Clean Catch; Nasal Swab  Result Value Ref Range Status   MRSA by PCR Next Gen NOT DETECTED NOT DETECTED Final    Comment: (NOTE) The GeneXpert MRSA Assay (FDA approved for NASAL specimens only), is one component of a comprehensive MRSA colonization surveillance program. It is not intended to diagnose MRSA infection nor to guide or monitor treatment for MRSA infections. Test performance is not FDA approved in patients less than 16 years old. Performed at [facility], [address], [place], [place] [address]      Studies: CT Lumbar Spine Wo Contrast Result Date: 10/24/2023 EXAM: CT OF THE LUMBAR SPINE WITHOUT CONTRAST 10/24/2023 05:53:58 PM TECHNIQUE: CT of the lumbar spine was performed without the administration of intravenous contrast. Multiplanar reformatted images are provided for review. Automated exposure control, iterative reconstruction, and/or weight based adjustment of  the mA/kV was utilized to reduce the radiation dose to as low as reasonably achievable. COMPARISON: Abdominal CT dated 12/23/2021. CLINICAL HISTORY: Back trauma, no prior imaging (Age >= 16y). Chief complaints; Shortness of Breath; CT Head Wo Contrast; Mental status change, unknown cause; CT Lumbar Spine Wo Contrast; Back trauma, no prior imaging (Age >= 16y) FINDINGS: BONES AND ALIGNMENT: Diminutive ribs at T12 with partial sacralization of the  L5 vertebral segment. Acute compression fracture of the L4 vertebral body with 3 mm retropulsion of the superior endplate and mild height loss. Linear sclerosis underlying the L1 superior endplate, suggestive of subacute to chronic compression fracture with minimal height loss. Moderate degenerative changes of the bilateral sacroiliac joints. DEGENERATIVE CHANGES: Multilevel lumbar spondylosis, most significant at L2-3, where there is at least mild spinal canal stenosis. SOFT TISSUES: Atherosclerotic calcifications of the abdominal aorta and its branches. Focal ectasia of the infrarenal aorta, measuring up to 3.2 cm on axial image 54 series 5. According to the guideline on Abdominal Aortic Aneurysms (AAA), the finding is consistent with a focal ectasia of the infrarenal aorta measuring 3.2 cm, which falls under the category of a small aneurysm. The recommendation is to follow up with imaging surveillance every 3 years, as per the SVS 2018 guidelines for aneurysms measuring 3.03.9 cm. IMPRESSION: 1. Acute compression fracture of the L4 vertebral body with 3 mm retropulsion of the superior endplate and mild height loss. 2. Linear sclerosis underlying the L1 superior endplate, suggestive of subacute to chronic compression fracture with minimal height loss. 3. Multilevel lumbar spondylosis, most significant at L2-3, where there is at least mild spinal canal stenosis. 4. Focal ectasia of the infrarenal aorta, measuring up to 3.2 cm, consistent with a small aneurysm. Follow-up  imaging surveillance every 3 years is recommended, as per the SVS 2018 guidelines for aneurysms measuring 3.03.9 cm. Electronically signed by: Audra Blend MD 10/24/2023 06:30 PM EDT RP Workstation: [id number]   CT Head Wo Contrast Result Date: 10/24/2023 CLINICAL DATA:  Mental status change, unknown cause EXAM: CT HEAD WITHOUT CONTRAST TECHNIQUE: Contiguous axial images were obtained from the base of the skull through the vertex without intravenous contrast. RADIATION DOSE REDUCTION: This exam was performed according to the departmental dose-optimization program which includes automated exposure control, adjustment of the mA and/or kV according to patient size and/or use of iterative reconstruction technique. COMPARISON:  CT head 09/03/2023 FINDINGS: Brain: Patchy and confluent areas of decreased attenuation are noted throughout the deep and periventricular white matter of the cerebral hemispheres bilaterally, compatible with chronic microvascular ischemic disease. No evidence of large-territorial acute infarction. No parenchymal hemorrhage. No mass lesion. No extra-axial collection. No mass effect or midline shift. No hydrocephalus. Basilar cisterns are patent. Vascular: No hyperdense vessel. Skull: No acute fracture or focal lesion. Sinuses/Orbits: Paranasal sinuses and mastoid air cells are clear. Bilateral lens replacement. Otherwise the orbits are unremarkable. Other: None. IMPRESSION: No acute intracranial abnormality. Electronically Signed   By: Morgane  Naveau M.D.   On: 10/24/2023 18:22   DG Chest 2 View Result Date: 10/24/2023 CLINICAL DATA:  Shortness of breath EXAM: CHEST - 2 VIEW COMPARISON:  Chest x-ray performed September 03, 2023 FINDINGS: Low lung volumes with enlarged heart central pulmonary vascular congestion. No significant pleural effusion. No pneumothorax. Osteopenia. Chronic bony deformities. IMPRESSION: 1. Enlarged heart with central pulmonary vascular congestion. 2. No significant  pleural effusion. Electronically Signed   By: Reagan Camera M.D.   On: 10/24/2023 12:28      Rosena Conradi, MD  [facility] 10/25/2023  If 7PM-7AM, please contact night-coverage

## 2023-10-26 ENCOUNTER — Inpatient Hospital Stay (HOSPITAL_COMMUNITY)

## 2023-10-26 DIAGNOSIS — R404 Transient alteration of awareness: Secondary | ICD-10-CM | POA: Diagnosis not present

## 2023-10-26 DIAGNOSIS — I959 Hypotension, unspecified: Principal | ICD-10-CM

## 2023-10-26 DIAGNOSIS — I5032 Chronic diastolic (congestive) heart failure: Secondary | ICD-10-CM

## 2023-10-26 DIAGNOSIS — I4821 Permanent atrial fibrillation: Secondary | ICD-10-CM

## 2023-10-26 DIAGNOSIS — E872 Acidosis, unspecified: Secondary | ICD-10-CM

## 2023-10-26 LAB — CBC
HCT: 38.9 % — ABNORMAL LOW (ref 39.0–52.0)
Hemoglobin: 12.5 g/dL — ABNORMAL LOW (ref 13.0–17.0)
MCH: 26.7 pg (ref 26.0–34.0)
MCHC: 32.1 g/dL (ref 30.0–36.0)
MCV: 83.1 fL (ref 80.0–100.0)
Platelets: 282 10*3/uL (ref 150–400)
RBC: 4.68 MIL/uL (ref 4.22–5.81)
RDW: 19.3 % — ABNORMAL HIGH (ref 11.5–15.5)
WBC: 11.4 10*3/uL — ABNORMAL HIGH (ref 4.0–10.5)
nRBC: 0 % (ref 0.0–0.2)

## 2023-10-26 LAB — GLUCOSE, CAPILLARY
Glucose-Capillary: 123 mg/dL — ABNORMAL HIGH (ref 70–99)
Glucose-Capillary: 121 mg/dL — ABNORMAL HIGH (ref 70–99)
Glucose-Capillary: 164 mg/dL — ABNORMAL HIGH (ref 70–99)
Glucose-Capillary: 166 mg/dL — ABNORMAL HIGH (ref 70–99)

## 2023-10-26 LAB — BASIC METABOLIC PANEL WITH GFR
Anion gap: 9 (ref 5–15)
BUN: 22 mg/dL (ref 8–23)
CO2: 26 mmol/L (ref 22–32)
Calcium: 8.6 mg/dL — ABNORMAL LOW (ref 8.9–10.3)
Chloride: 104 mmol/L (ref 98–111)
Creatinine, Ser: 1.82 mg/dL — ABNORMAL HIGH (ref 0.61–1.24)
GFR, Estimated: 37 mL/min — ABNORMAL LOW (ref >60)
Glucose, Bld: 122 mg/dL — ABNORMAL HIGH (ref 70–99)
Potassium: 3.7 mmol/L (ref 3.5–5.1)
Sodium: 139 mmol/L (ref 135–145)

## 2023-10-26 LAB — MAGNESIUM: Magnesium: 1.9 mg/dL (ref 1.7–2.4)

## 2023-10-26 MED ORDER — ENSURE PLUS HIGH PROTEIN PO LIQD
237.0000 mL | Freq: Two times a day (BID) | ORAL | Status: DC
  Administered 2023-10-26 – 2023-10-30 (×9): 237 mL via ORAL

## 2023-10-26 NOTE — Progress Notes (Signed)
 Pt unable to stand for standing BP in orthostatics. Laxman Pokhrel, MD made aware.

## 2023-10-26 NOTE — Plan of Care (Signed)
  Problem: Education: Goal: Ability to describe self-care measures that may prevent or decrease complications (Diabetes Survival Skills Education) will improve Outcome: Progressing   Problem: Coping: Goal: Ability to adjust to condition or change in health will improve Outcome: Progressing

## 2023-10-26 NOTE — Progress Notes (Signed)
 PROGRESS NOTE  [name]:[id number] DOB: Jan 06, 1941 DOA: 10/24/2023 PCP: Glena Landau, MD   LOS: 2 days   Brief narrative:  Steve Jensen is a 83 y.o. male with past medical history atrial fibrillation on Eliquis , hypertension, chronic heart failure with preserved ejection, OSA on CPAP, obesity, type 2 diabetes melitis, CKD stage IIIa, GERD, hepatocellular carcinoma, history of prostate cancer, falls, presented to the hospital from skilled nursing facility with complaints of shortness of breath.  Patient was noted to be hypoxic with pulse ox of 88% on room air on arrival.  He was also noted to have A-fib with RVR and received IV fluids.  Of note patient was last hospitalized in April 2025 for syncope and concerns for bradycardia.  At that time Cardizem  was discontinued and a Holter monitor was placed.  Patient was subsequently seen on 27th May 25 by cardiology and was continued on metoprolol  with Lasix  and Zetia .  Patient did not complete his Holter monitor.  This time at the skilled nursing facility patient was noted to be very weak and disoriented and blood pressure was low.  Subsequently, had a fall at the skilled nursing facility.  In the ED, blood pressure was better.  Labs showed creatinine of 2.3 with lactate elevation at 3.6.  BNP elevated at 411.  Review of previous 2D echocardiogram from 09/04/2023 showed LV ejection fraction of 55 to 60%.  Patient was then considered for admission to the hospital for further evaluation and treatment.    Assessment/Plan: Principal Problem:   AMS (altered mental status) Active Problems:   Severe sepsis with acute organ dysfunction (HCC)   Essential hypertension, benign   PAF (paroxysmal atrial fibrillation) (HCC)   Uncontrolled type 2 diabetes mellitus with hyperglycemia, without long-term current use of insulin  (HCC)   AKI (acute kidney injury) (HCC)   Chronic heart failure with preserved ejection fraction (HFpEF) (HCC)   SOB (shortness of  breath)  Altered mental status. Transient.  Likely secondary to hypotension.  MRI of the brain pending..  Patient likely at baseline.  Alert awake and Communicative.  Hypotension, fall.  Could be secondary to medication related.  Currently on hold.  Might need to adjust medications with cardiology.  Received gentle IV fluids.  Will get orthostatic vitals, pending..  Will reach out to cardiology regarding adjustment on medications on discharge if necessary.   SIRS without sepsis. No obvious signs of infection.  Sepsis has been ruled out.  Patient had leukocytosis with AKI in lactic acidosis.  Lactate has improved at this time.  On empiric antibiotics.  Antihypertensives on hold.  Currently on vancomycin and Rocephin  empirically.   Blood cultures negative in 2 days.  Urine culture insignificant.   Will discontinue antibiotics today.  Chest x-ray without any infiltrate.  MRSA PCR negative.  Urinalysis was negative for infection.  Diarrhea.  Will get C. difficile testing..  AKI: Baseline creatinine around 1.3-1.4.  Creatinine on presentation at 2.3.  Received some IV fluids.  Hold nephrotoxic agents.  Latest creatinine at 1.8..  Will continue to monitor closely.  Intake and output charting.  Diuretics still on hold.   Falls/ Acute compression fracture of the L4 : CT head without any acute findings.  Patient complained of lower back pain show CT of the lumbar spine showed acute compression of the L4 fracture with 3 mm retropulsion.  Neurosurgery on-call was consulted by ED who  recommended LSO brace and outpatient follow-up.  Will get orthostatic vitals.  W PT OT  recommends skilled nursing facility placement.   Anemia.   Hemoglobin of 12.2 on presentation.  Will continue to monitor CBC.  Hemoglobin today at 12.5.    History of chronic heart failure with preserved ejection fraction. 2D echocardiogram with LV EF 55-60 %..  Was on Lasix  and metoprolol  at home.  Currently Lasix  on hold.  Latest blood  pressure of 112/ 82.  Continue intake and output charting Daily weights.  On supplemental oxygen  at this time    Diabetes mellitus type II. Continue sliding scale insulin .  Closely monitor blood glucose levels.  Latest POC glucose of 123    Paroxysmal atrial fibrillation with RVR yesterday. Continue Eliquis .  Was resumed yesterday due to RVR.  Lactic acidosis: Differential diagnosis include metformin  related hypotension related.  Received gentle IV fluids.  Has improved at this time.  Class III obesity.  Would benefit from lifestyle modification and weight loss.  Body mass index is 44.73 kg/m.   DVT prophylaxis: apixaban  (ELIQUIS ) tablet 2.5 mg Start: 10/24/23 2200 apixaban  (ELIQUIS ) tablet 2.5 mg   Disposition: Skilled nursing facility in 1 to 2 days  Status is: Inpatient Remains inpatient appropriate because:  pending clinical improvement, skilled nursing facility resident.    Code Status:     Code Status: Full Code  Family Communication: None at bedside  Consultants: Curbside neuro surgery Cardiology  Procedures: None  Anti-infectives:  Vancomycin and Rocephin  IV-will discontinue  Anti-infectives (From admission, onward)    Start     Dose/Rate Route Frequency Ordered Stop   10/24/23 2045  vancomycin (VANCOREADY) IVPB 1500 mg/300 mL  Status:  Discontinued        1,500 mg 150 mL/hr over 120 Minutes Intravenous Every 48 hours 10/24/23 1948 10/25/23 1021   10/24/23 2030  cefTRIAXone  (ROCEPHIN ) 2 g in sodium chloride  0.9 % 100 mL IVPB        2 g 200 mL/hr over 30 Minutes Intravenous Every 24 hours 10/24/23 1930 10/29/23 2014        Subjective: Today, patient was seen and examined at bedside.  Poor historian.  Denies any pain, nausea, vomiting, fever or chills.  Denies overt pain at this time.  Had some loose stools.   Objective: Vitals:   10/26/23 0453 10/26/23 0753  BP: 122/81 126/75  Pulse: 69 97  Resp: 16 18  Temp: 98.9 F (37.2 C) 98.5 F (36.9 C)   SpO2: 93% 95%    Intake/Output Summary (Last 24 hours) at 10/26/2023 1023 Last data filed at 10/26/2023 0835 Gross per 24 hour  Intake 123 ml  Output 800 ml  Net -677 ml   Filed Weights   10/24/23 1922 10/25/23 0507 10/26/23 0500  Weight: 128 kg 128.2 kg (!) 137.4 kg   Body mass index is 44.73 kg/m.   Physical Exam:  GENERAL: Patient is alert awake and oriented to place.  Poor historian.  Not in obvious distress.  Obese built, on nasal cannula oxygen  at 4 L/min., elderly male HENT: No scleral pallor or icterus. Pupils equally reactive to light. Oral mucosa is moist NECK: is supple, no gross swelling noted. CHEST: Clear to auscultation. No crackles or wheezes.  Diminished breath sounds bilaterally. CVS: S1 and S2 heard, no murmur.  Irregular rhythm. ABDOMEN: Soft, non-tender, bowel sounds are present.  Lower back tenderness on palpation EXTREMITIES: No edema. CNS: Cranial nerves are intact.  Moves extremities. SKIN: warm and dry without rashes.  Data Review: I have personally reviewed the following laboratory data and studies,  CBC:  Recent Labs  Lab 10/24/23 1123 10/26/23 0541  WBC 13.2* 11.4*  NEUTROABS 9.5*  --   HGB 12.2* 12.5*  HCT 38.5* 38.9*  MCV 84.6 83.1  PLT 291 282   Basic Metabolic Panel: Recent Labs  Lab 10/24/23 1123 10/25/23 0447 10/26/23 0541  NA 143 142 139  K 4.0 3.5 3.7  CL 105 107 104  CO2 22 22 26   GLUCOSE 163* 99 122*  BUN 26* 22 22  CREATININE 2.39* 2.00* 1.82*  CALCIUM  9.1 8.6* 8.6*  MG 1.8  --  1.9   Liver Function Tests: No results for input(s): "AST", "ALT", "ALKPHOS", "BILITOT", "PROT", "ALBUMIN " in the last 168 hours. No results for input(s): "LIPASE", "AMYLASE" in the last 168 hours. No results for input(s): "AMMONIA" in the last 168 hours. Cardiac Enzymes: No results for input(s): "CKTOTAL", "CKMB", "CKMBINDEX", "TROPONINI" in the last 168 hours. BNP (last 3 results) Recent Labs    11/11/22 1210 09/03/23 2103  10/24/23 1123  BNP 457.0* 152.3* 411.2*    ProBNP (last 3 results) No results for input(s): "PROBNP" in the last 8760 hours.  CBG: Recent Labs  Lab 10/25/23 0901 10/25/23 1152 10/25/23 1718 10/25/23 2134 10/26/23 0749  GLUCAP 100* 132* 147* 170* 123*   Recent Results (from the past 240 hours)  Culture, blood (Routine X 2) w Reflex to ID Panel     Status: None (Preliminary result)   Collection Time: 10/24/23  2:51 PM   Specimen: BLOOD  Result Value Ref Range Status   Specimen Description BLOOD RIGHT ANTECUBITAL  Final   Special Requests   Final    BOTTLES DRAWN AEROBIC AND ANAEROBIC Blood Culture adequate volume   Culture   Final    NO GROWTH 2 DAYS Performed at [facility], [address] [place], [place] [address]    Report Status PENDING  Incomplete  Culture, blood (Routine X 2) w Reflex to ID Panel     Status: None (Preliminary result)   Collection Time: 10/24/23  2:56 PM   Specimen: BLOOD  Result Value Ref Range Status   Specimen Description BLOOD LEFT ANTECUBITAL  Final   Special Requests   Final    BOTTLES DRAWN AEROBIC AND ANAEROBIC Blood Culture results may not be optimal due to an inadequate volume of blood received in culture bottles   Culture   Final    NO GROWTH 2 DAYS Performed at [facility], [address] [place], [place] [address]    Report Status PENDING  Incomplete  Urine Culture (for pregnant, neutropenic or urologic patients or patients with an indwelling urinary catheter)     Status: Abnormal   Collection Time: 10/24/23 10:23 PM   Specimen: Urine, Clean Catch  Result Value Ref Range Status   Specimen Description URINE, CLEAN CATCH  Final   Special Requests NONE  Final   Culture (A)  Final    <10,000 COLONIES/mL INSIGNIFICANT GROWTH Performed at [facility], [address], [place], [place] [address]    Report Status 10/25/2023 FINAL  Final  MRSA Next Gen by PCR, Nasal     Status: None   Collection Time: 10/24/23 10:23  PM   Specimen: Urine, Clean Catch; Nasal Swab  Result Value Ref Range Status   MRSA by PCR Next Gen NOT DETECTED NOT DETECTED Final    Comment: (NOTE) The GeneXpert MRSA Assay (FDA approved for NASAL specimens only), is one component of a comprehensive MRSA colonization surveillance program. It is  not intended to diagnose MRSA infection nor to guide or monitor treatment for MRSA infections. Test performance is not FDA approved in patients less than 61 years old. Performed at [facility], [address], [place], [place] [address]      Studies: CT Lumbar Spine Wo Contrast Result Date: 10/24/2023 EXAM: CT OF THE LUMBAR SPINE WITHOUT CONTRAST 10/24/2023 05:53:58 PM TECHNIQUE: CT of the lumbar spine was performed without the administration of intravenous contrast. Multiplanar reformatted images are provided for review. Automated exposure control, iterative reconstruction, and/or weight based adjustment of the mA/kV was utilized to reduce the radiation dose to as low as reasonably achievable. COMPARISON: Abdominal CT dated 12/23/2021. CLINICAL HISTORY: Back trauma, no prior imaging (Age >= 16y). Chief complaints; Shortness of Breath; CT Head Wo Contrast; Mental status change, unknown cause; CT Lumbar Spine Wo Contrast; Back trauma, no prior imaging (Age >= 16y) FINDINGS: BONES AND ALIGNMENT: Diminutive ribs at T12 with partial sacralization of the L5 vertebral segment. Acute compression fracture of the L4 vertebral body with 3 mm retropulsion of the superior endplate and mild height loss. Linear sclerosis underlying the L1 superior endplate, suggestive of subacute to chronic compression fracture with minimal height loss. Moderate degenerative changes of the bilateral sacroiliac joints. DEGENERATIVE CHANGES: Multilevel lumbar spondylosis, most significant at L2-3, where there is at least mild spinal canal stenosis. SOFT TISSUES: Atherosclerotic calcifications of the abdominal aorta and its  branches. Focal ectasia of the infrarenal aorta, measuring up to 3.2 cm on axial image 54 series 5. According to the guideline on Abdominal Aortic Aneurysms (AAA), the finding is consistent with a focal ectasia of the infrarenal aorta measuring 3.2 cm, which falls under the category of a small aneurysm. The recommendation is to follow up with imaging surveillance every 3 years, as per the SVS 2018 guidelines for aneurysms measuring 3.03.9 cm. IMPRESSION: 1. Acute compression fracture of the L4 vertebral body with 3 mm retropulsion of the superior endplate and mild height loss. 2. Linear sclerosis underlying the L1 superior endplate, suggestive of subacute to chronic compression fracture with minimal height loss. 3. Multilevel lumbar spondylosis, most significant at L2-3, where there is at least mild spinal canal stenosis. 4. Focal ectasia of the infrarenal aorta, measuring up to 3.2 cm, consistent with a small aneurysm. Follow-up imaging surveillance every 3 years is recommended, as per the SVS 2018 guidelines for aneurysms measuring 3.03.9 cm. Electronically signed by: Audra Blend MD 10/24/2023 06:30 PM EDT RP Workstation: [id number]   CT Head Wo Contrast Result Date: 10/24/2023 CLINICAL DATA:  Mental status change, unknown cause EXAM: CT HEAD WITHOUT CONTRAST TECHNIQUE: Contiguous axial images were obtained from the base of the skull through the vertex without intravenous contrast. RADIATION DOSE REDUCTION: This exam was performed according to the departmental dose-optimization program which includes automated exposure control, adjustment of the mA and/or kV according to patient size and/or use of iterative reconstruction technique. COMPARISON:  CT head 09/03/2023 FINDINGS: Brain: Patchy and confluent areas of decreased attenuation are noted throughout the deep and periventricular white matter of the cerebral hemispheres bilaterally, compatible with chronic microvascular ischemic disease. No evidence of  large-territorial acute infarction. No parenchymal hemorrhage. No mass lesion. No extra-axial collection. No mass effect or midline shift. No hydrocephalus. Basilar cisterns are patent. Vascular: No hyperdense vessel. Skull: No acute fracture or focal lesion. Sinuses/Orbits: Paranasal sinuses and mastoid air cells are clear. Bilateral lens replacement. Otherwise the orbits are unremarkable. Other: None. IMPRESSION: No acute intracranial abnormality. Electronically Signed  By: Morgane  Naveau M.D.   On: 10/24/2023 18:22   DG Chest 2 View Result Date: 10/24/2023 CLINICAL DATA:  Shortness of breath EXAM: CHEST - 2 VIEW COMPARISON:  Chest x-ray performed September 03, 2023 FINDINGS: Low lung volumes with enlarged heart central pulmonary vascular congestion. No significant pleural effusion. No pneumothorax. Osteopenia. Chronic bony deformities. IMPRESSION: 1. Enlarged heart with central pulmonary vascular congestion. 2. No significant pleural effusion. Electronically Signed   By: Reagan Camera M.D.   On: 10/24/2023 12:28      Rosena Conradi, MD  [facility] 10/26/2023  If 7PM-7AM, please contact night-coverage

## 2023-10-26 NOTE — Progress Notes (Signed)
 Orthopedic Tech Progress Note Patient Details:  Steve Jensen Oct 05, 1940 [id number]  Ortho Devices Type of Ortho Device: Lumbar corsett Ortho Device/Splint Location: BACK Ortho Device/Splint Interventions: Ordered, Adjustment, Other (comment)patient was not in room when I dropped off back brace, I did set it to 2XL   Post Interventions Patient Tolerated: Other (comment) Instructions Provided: Care of device  Kermitt Pedlar 10/26/2023, 9:42 AM

## 2023-10-26 NOTE — Plan of Care (Signed)
  Problem: Pain Managment: Goal: General experience of comfort will improve and/or be controlled Outcome: Progressing   Problem: Safety: Goal: Ability to remain free from injury will improve Outcome: Progressing   Problem: Skin Integrity: Goal: Risk for impaired skin integrity will decrease Outcome: Progressing

## 2023-10-26 NOTE — TOC Initial Note (Addendum)
 Transition of Care [facility]) - Initial/Assessment Note    Patient Details  Name: Steve Jensen MRN: [id number] Date of Birth: 19-Mar-1941  Transition of [facility]) CM/SW Contact:    Kadesia Robel A [place], LCSW Phone Number: 10/26/2023, 2:35 PM  Clinical Narrative:                  CSW spoke with pt's daughter [name] along with pt. She informed CSW that pt is from [facility]F side. Discussed recommendation for SNF. She said that he went to rehab in April, then transferred short to the ALF section on approximately April 15th. She said that pt was about to start up home health PT/OT via the facility before he was admitted to the hospital.   She said that if pt can remain on ALF side and receive PT/OT services, that would be a preference, versus going to the short term rehab side, as she believes pt may have used up his Medicare days and was not sure about him being able to keep his bed in the ALF if he goes to rehab.   CSW attempted to reach out to [facility] [place] [facility], no answer, unable to leave VM. CSW to reach back out when able.   TOC will continue to follow.   Expected Discharge Plan: Skilled Nursing Facility Barriers to Discharge: Continued Medical Work up, English as a second language teacher   Patient Goals and CMS Choice            Expected Discharge Plan and Services       Living arrangements for the past 2 months: Assisted Living Facility                                      Prior Living Arrangements/Services Living arrangements for the past 2 months: Assisted Living Facility Lives with:: Facility Resident          Need for Family Participation in Patient Care: Yes (Comment) Care giver support system in place?: Yes (comment) (pt's daughter, Dempsey Ahonen)      Activities of Daily Living      Permission Sought/Granted                  Emotional Assessment Appearance:: Appears older than stated age Attitude/Demeanor/Rapport: Unable to Assess Affect  (typically observed): Unable to Assess Orientation: : Oriented to Self, Oriented to Place, Oriented to  Time Alcohol / Substance Use: Not Applicable Psych Involvement: No (comment)  Admission diagnosis:  SOB (shortness of breath) [R06.02] Altered mental status, unspecified altered mental status type [R41.82] Acute on chronic congestive heart failure, unspecified heart failure type (HCC) [I50.9] Patient Active Problem List   Diagnosis Date Noted   AMS (altered mental status) 10/24/2023   Metabolic acidosis 10/24/2023   SOB (shortness of breath) 10/24/2023   Syncope 09/04/2023   Cellulitis 09/04/2023   AKI (acute kidney injury) (HCC) 09/04/2023   Hyperkalemia 09/04/2023   Long term (current) use of anticoagulants 09/04/2023   Fall 09/04/2023   Chronic heart failure with preserved ejection fraction (HFpEF) (HCC) 09/04/2023   Acute on chronic diastolic (congestive) heart failure (HCC) 11/11/2022   Weakness of right lower extremity 11/11/2022   Hypokalemia 11/11/2022   Ileus (HCC)    Uncontrolled type 2 diabetes mellitus with hyperglycemia, without long-term current use of insulin  (HCC) 12/17/2021   Osteoarthritis of right knee 12/17/2021   Community acquired pneumonia of left lower lobe of lung 12/16/2021  Severe sepsis with acute organ dysfunction (HCC) 12/16/2021   HCC (hepatocellular carcinoma) (HCC) 02/15/2018   Hepatocellular carcinoma (HCC) 12/30/2017   Pain in joint of left shoulder 11/09/2017   Hypertensive heart disease with heart failure (HCC) 03/23/2017   Chronic diastolic heart failure (HCC) 03/19/2015   PAF (paroxysmal atrial fibrillation) (HCC) 08/11/2013   S/P left knee arthroscopy 07/11/2013   Mixed hyperlipidemia 03/03/2013   Essential hypertension, benign 03/03/2013   Malignant neoplasm of prostate (HCC) 03/03/2013   Osteoarthrosis, unspecified whether generalized or localized, other specified sites 03/03/2013   Allergic rhinitis 03/03/2013   Type 2 diabetes  mellitus (HCC) 03/03/2013   Diabetic polyneuropathy (HCC) 03/03/2013   OSA (obstructive sleep apnea) 03/03/2013   Proteinuria 03/03/2013   Morbid obesity (HCC) 03/03/2013   Right knee pain 02/07/2013   PCP:  Glena Landau, MD Pharmacy:   [facility] [phone] - [place], [place] - [address] [place] [address] Phone: [phone] Fax: [phone]     Social Drivers of Health (SDOH) Social History: SDOH Screenings   Food Insecurity: No Food Insecurity (10/25/2023)  Housing: Low Risk  (10/25/2023)  Transportation Needs: No Transportation Needs (10/25/2023)  Utilities: Not At Risk (10/25/2023)  Social Connections: Socially Isolated (10/25/2023)  Tobacco Use: Medium Risk (10/24/2023)   SDOH Interventions: Social Connections Interventions: Patient Unable to Answer (Memory Impairment)   Readmission Risk Interventions    12/26/2021   12:51 PM 12/25/2021    3:31 PM 12/19/2021   12:39 PM  Readmission Risk Prevention Plan  Transportation Screening  Complete Complete  PCP or Specialist Appt within 5-7 Days Complete    Home Care Screening  Complete   Medication Review (RN CM)  Complete   HRI or Home Care Consult   Complete  Social Work Consult for Recovery Care Planning/Counseling   Complete  Palliative Care Screening   Not Applicable  Medication Review Oceanographer)   Complete

## 2023-10-26 NOTE — Consult Note (Signed)
 Cardiology Consultation   Patient ID: Steve Jensen MRN: [id number]; DOB: May 06, 1941  Admit date: 10/24/2023 Date of Consult: 10/26/2023  PCP:  Glena Landau, MD   [facility] Providers Cardiologist:  Sheryle Donning, MD        Patient Profile: Steve Jensen is a 83 y.o. male with a hx of longstanding persistent vs. Permanent atrial fibrillation, chronic diastolic heart failure, ckd 3a, history of hepatocellular carcinoma and prostate cancer, type II diabetes, and OSA  who is being seen 10/26/2023 for the evaluation of hypotension at the request of Dr. Efrain Grant.  History of Present Illness: I met Mr. Cordial recently after his hospital discharge. At that visit, he noted that he had not completed the monitor and rather returned it unopened. Had not had syncope or recurrent fall while at rehab or assisted living since his hospital discharge. He had intermittent low BP noted but denied symptoms. Was only on lasix  and metoprolol  at the time of our visit.  On 5/31, he was brought to the ER from his facility for O2 saturations of 88% and generalized weakness. Noted to be hypotensive. Patient denied shortness of breath, cough, fever, or chest pain. He was found to be in afib RVR at the time of EMS arrival and was given 400 cc fluids. ER notes that family thought he had worsening confusion for 2 days prior.   Labs showed BNP elevated at 411, hsTn normal. Initial lactate 3.6, then rechecked at 1.7. UA/culture unremarkable with insignificant growth. Cr elevated to 3.29 from prior baseline 1.35-1.77. Slight leukocytosis but improved from prior hospitalization.  Patient's daughter [name] is in the room on my interview today. She was also present on our recent outpatient visit. She tells me that the fall around 2 AM 5/28 was a mechanical fall where he went backwards and landed on his back/gluteal region, did not hit his head. He was back to his baseline shortly after and was unchanged  until 5/31 (Saturday). She reports that he was very weak, couldn't stand easily on his own, was confused. Blood pressure was low. Also had new O2 requirement. When he did not improve significantly, EMS was called and he was brought to ER.  Patient denies any fevers, chills, cough. Does have a history of intermittent watery diarrhea, which he is currently dealing with. No abdominal pain, melena, or hematochezia.  Daughter notes that he was still confused yesterday AM, but over the course of yesterday afternoon and this AM, he is now back to his baseline.   Past Medical History:  Diagnosis Date   Anemia    Diabetes mellitus type II, controlled (HCC)    with neuropathy   Dysrhythmia    A-Fib. cardioversion done   GERD (gastroesophageal reflux disease)    Hyperlipidemia    Hypertension    Morbid obesity (HCC)    Neuromuscular disorder (HCC)    feet   OSA (obstructive sleep apnea)    On BiPAP at 15/11cm H2O   Prostate cancer (HCC)    Shingles    on face Nov 2010   Urinary incontinence     Past Surgical History:  Procedure Laterality Date   CARDIOVERSION N/A 09/06/2013   Procedure: CARDIOVERSION;  Surgeon: Lucendia Rusk, MD;  Location: [facility] ENDOSCOPY;  Service: Cardiovascular;  Laterality: N/A;   EYE SURGERY     cataract surgery bilat; surgery to correct droopy eyelids    FLEXIBLE SIGMOIDOSCOPY N/A 12/24/2021   Procedure: FLEXIBLE SIGMOIDOSCOPY;  Surgeon: Urban Garden, MD;  Location: [facility] ENDO SUITE;  Service: Gastroenterology;  Laterality: N/A;   IR ANGIOGRAM SELECTIVE EACH ADDITIONAL VESSEL  02/15/2018   IR ANGIOGRAM SELECTIVE EACH ADDITIONAL VESSEL  02/15/2018   IR ANGIOGRAM SELECTIVE EACH ADDITIONAL VESSEL  02/15/2018   IR ANGIOGRAM SELECTIVE EACH ADDITIONAL VESSEL  02/15/2018   IR ANGIOGRAM SELECTIVE EACH ADDITIONAL VESSEL  02/15/2018   IR ANGIOGRAM VISCERAL SELECTIVE  02/15/2018   IR ANGIOGRAM VISCERAL SELECTIVE  02/15/2018   IR EMBO TUMOR ORGAN ISCHEMIA INFARCT INC  GUIDE ROADMAPPING  02/15/2018   IR RADIOLOGIST EVAL & MGMT  01/26/2018   IR RADIOLOGIST EVAL & MGMT  03/17/2018   IR RADIOLOGIST EVAL & MGMT  06/29/2018   IR RADIOLOGIST EVAL & MGMT  07/27/2018   IR RADIOLOGIST EVAL & MGMT  10/28/2018   IR RADIOLOGIST EVAL & MGMT  12/14/2018   IR RADIOLOGIST EVAL & MGMT  03/17/2019   IR RADIOLOGIST EVAL & MGMT  07/14/2019   IR RADIOLOGIST EVAL & MGMT  12/27/2019   IR RADIOLOGIST EVAL & MGMT  08/21/2020   IR RADIOLOGIST EVAL & MGMT  02/28/2021   IR RADIOLOGIST EVAL & MGMT  06/25/2021   IR RADIOLOGIST EVAL & MGMT  10/18/2021   IR US  GUIDE VASC ACCESS RIGHT  02/15/2018   knee surgery bilat      PROSTATE SURGERY     RADIOLOGY WITH ANESTHESIA N/A 11/24/2018   Procedure: MICROWAVE THERMAL ABLATION LIVER;  Surgeon: Robbi Childs, MD;  Location: [facility] ORS;  Service: Anesthesiology;  Laterality: N/A;   right shoulder rotator cuff surgery     TONSILLECTOMY       Home Medications:  Prior to Admission medications   Medication Sig Start Date End Date Taking? Authorizing Provider  allopurinol  (ZYLOPRIM ) 100 MG tablet Take 100 mg by mouth in the morning. 04/17/21  Yes [provider]  apixaban  (ELIQUIS ) 5 MG TABS tablet TAKE 1 TABLET(5 MG) BY MOUTH TWICE DAILY Patient taking differently: Take 5 mg by mouth 2 (two) times daily. 11/15/18  Yes Turner, Rufus Council, MD  Baclofen 5 MG TABS Take 1 tablet by mouth every 6 (six) hours as needed.   Yes [provider]  benzonatate  (TESSALON ) 100 MG capsule Take 100 mg by mouth 3 (three) times daily as needed. 06/08/23  Yes [provider]  Cholecalciferol (VITAMIN D) 50 MCG (2000 UT) CAPS Take 2,000 Units by mouth in the morning.   Yes [provider]  docusate sodium  (COLACE) 100 MG capsule Take 100 mg by mouth 2 (two) times daily.   Yes [provider]  ezetimibe  (ZETIA ) 10 MG tablet Take 10 mg by mouth in the morning.   Yes [provider]  ferrous sulfate 325 (65 FE) MG tablet Take 325 mg by mouth  daily with breakfast.   Yes [provider]  FLUoxetine  (PROZAC ) 10 MG capsule Take 10 mg by mouth every evening. 04/14/23  Yes [provider]  furosemide  (LASIX ) 20 MG tablet Take 20 mg by mouth every evening.   Yes [provider]  furosemide  (LASIX ) 40 MG tablet Take 40 mg by mouth in the morning.   Yes [provider]  guaiFENesin  (MUCINEX ) 600 MG 12 hr tablet Take 600 mg by mouth 2 (two) times daily as needed for cough or to loosen phlegm.   Yes [provider]  insulin  aspart (NOVOLOG ) 100 UNIT/ML injection Inject 0-10 Units into the skin 3 (three) times daily before meals. Sliding scale: if sugar is 150-200 = 2 units,  if 201-250 = 4 units, if 251-300 = 6 units, if 301-350 = 8 units, if 351-400 = 10 units, give blood sugar is greater than 400 then call the MD.   Yes [provider]  Insulin  Glargine-Lixisenatide (SOLIQUA) 100-33 UNT-MCG/ML SOPN Inject 22 Units into the skin in the morning.   Yes [provider]  leptospermum manuka honey (MEDIHONEY) PSTE paste Apply 1 Application topically daily. Monday and Thursday   Yes [provider]  melatonin 5 MG TABS Take 5 mg by mouth at bedtime. 11/17/22  Yes [provider]  metFORMIN  (GLUCOPHAGE ) 1000 MG tablet Take 1,000 mg by mouth 2 (two) times daily with a meal. 06/27/14  Yes [provider]  metoprolol  succinate (TOPROL -XL) 100 MG 24 hr tablet Take 1 tablet (100 mg total) by mouth daily. Take with or immediately following a meal. Patient taking differently: Take 100 mg by mouth in the morning. Take with or immediately following a meal. 09/09/23  Yes Krishnan, Gokul, MD  Multiple Vitamins-Minerals (MULTIVITAMIN MEN 50+) TABS Take 1 tablet by mouth in the morning and at bedtime.   Yes [provider]  Omega-3 Fatty Acids (FISH OIL PO) Take 1 capsule by mouth 2 (two) times daily.   Yes [provider]  omeprazole  (PRILOSEC) 20 MG capsule Take 1  capsule (20 mg total) by mouth daily. Patient taking differently: Take 20 mg by mouth in the morning. 11/15/22  Yes Johnson, Clanford L, MD  polyethylene glycol (MIRALAX  / GLYCOLAX ) 17 g packet Take 17 g by mouth daily as needed for moderate constipation.   Yes [provider]  potassium chloride  SA (KLOR-CON  M) 10 MEQ tablet Take 1 tablet (10 mEq total) by mouth daily. Patient taking differently: Take 10 mEq by mouth in the morning. 09/05/23  Yes Krishnan, Gokul, MD  pregabalin  (LYRICA ) 100 MG capsule Take 1 capsule (100 mg total) by mouth 2 (two) times daily. 09/08/23  Yes Krishnan, Gokul, MD  senna (SENOKOT) 8.6 MG TABS tablet Take 1 tablet by mouth in the morning and at bedtime.   Yes [provider]    Scheduled Meds:  apixaban   2.5 mg Oral BID   FLUoxetine   10 mg Oral QPM   insulin  aspart  0-15 Units Subcutaneous TID WC   metoprolol  succinate  100 mg Oral Daily   sodium chloride  flush  3 mL Intravenous Q12H   Continuous Infusions:  PRN Meds: acetaminophen  **OR** acetaminophen   Allergies:    Allergies  Allergen Reactions   Lipitor [Atorvastatin] Other (See Comments)    Myalgias   Zithromax [Azithromycin] Other (See Comments)    GI intolerance   Zocor [Simvastatin] Other (See Comments)    Myalgias    Norvasc  [Amlodipine ] Swelling    Social History:   Social History   Socioeconomic History   Marital status: Widowed    Spouse name: Not on file   Number of children: Not on file   Years of education: Not on file   Highest education level: Not on file  Occupational History   Not on file  Tobacco Use   Smoking status: Former    Current packs/day: 0.00    Average packs/day: 1 pack/day for 15.0 years (15.0 ttl pk-yrs)    Types: Cigarettes    Start date: 05/26/1960    Quit date: 05/27/1975    Years since quitting: 48.4   Smokeless tobacco: Never  Vaping Use   Vaping status: Never Used  Substance and Sexual Activity   Alcohol use:  No   Drug use: No    Sexual activity: Not on file  Other Topics Concern   Not on file  Social History Narrative   Not on file   Social Drivers of Health   Financial Resource Strain: Not on file  Food Insecurity: No Food Insecurity (10/25/2023)   Hunger Vital Sign    Worried About Running Out of Food in the Last Year: Never true    Ran Out of Food in the Last Year: Never true  Transportation Needs: No Transportation Needs (10/25/2023)   PRAPARE - Administrator, Civil Service (Medical): No    Lack of Transportation (Non-Medical): No  Physical Activity: Not on file  Stress: Not on file  Social Connections: Socially Isolated (10/25/2023)   Social Connection and Isolation Panel [NHANES]    Frequency of Communication with Friends and Family: More than three times a week    Frequency of Social Gatherings with Friends and Family: More than three times a week    Attends Religious Services: Never    Database administrator or Organizations: No    Attends Banker Meetings: Never    Marital Status: Widowed  Intimate Partner Violence: Not At Risk (10/25/2023)   Humiliation, Afraid, Rape, and Kick questionnaire    Fear of Current or Ex-Partner: No    Emotionally Abused: No    Physically Abused: No    Sexually Abused: No    Family History:    Family History  Problem Relation Age of Onset   Hypertension Brother    Diabetes Brother    Cancer Brother        bladder cancer   Diabetes Brother    CVA Brother    Hypertension Brother    Prostate cancer Brother    Diabetes Brother    Hypertension Brother    Heart disease Brother        CABG   Heart attack Brother    Stroke Brother      ROS:  Please see the history of present illness.  Constitutional: Negative for chills, fever, night sweats, unintentional weight loss  HENT: Negative for ear pain and hearing loss.   Eyes: Negative for loss of vision and eye pain.  Respiratory: Negative for cough, sputum, wheezing.   Cardiovascular: See  HPI. Gastrointestinal: Negative for abdominal pain, melena, and hematochezia. Positive for watery diarrhea Genitourinary: Negative for dysuria and hematuria.  Musculoskeletal: Negative for myalgias. Positive for fall Skin: Negative for itching and rash.  Neurological: Negative for focal weakness, focal sensory changes and loss of consciousness. Positive for transient altered mental status Endo/Heme/Allergies: Does bruise/bleed easily.   All other ROS reviewed and negative.     Physical Exam/Data: Vitals:   10/26/23 0500 10/26/23 0753 10/26/23 1117 10/26/23 1119  BP:  126/75 113/83 113/83  Pulse:  97 100 88  Resp:  18 17 (!) 23  Temp:  98.5 F (36.9 C) 97.7 F (36.5 C)   TempSrc:  Oral Oral   SpO2:  95% 96% 96%  Weight: (!) 137.4 kg     Height:        Intake/Output Summary (Last 24 hours) at 10/26/2023 1158 Last data filed at 10/26/2023 0835 Gross per 24 hour  Intake 123 ml  Output 800 ml  Net -677 ml      10/26/2023    5:00 AM 10/25/2023    5:07 AM 10/24/2023    7:22 PM  Last 3 Weights  Weight (lbs) 302 lb  14.6 oz 282 lb 10.1 oz 282 lb 3 oz  Weight (kg) 137.4 kg 128.2 kg 128 kg     Body mass index is 44.73 kg/m.  General:  Well nourished, well developed, in no acute distress HEENT: normal Neck: JVD at low neck at 45 degrees Vascular: No carotid bruits; Distal pulses 2+ bilaterally Cardiac:  normal S1, S2; tachycardic, irregularly irregular; no murmur  Lungs:  clear to auscultation bilaterally, no wheezing, rhonchi. Slight focal coarseness/rales at R base Abd: soft, nontender, no hepatomegaly  Ext: no edema Musculoskeletal:  No deformities, BUE and BLE strength normal and equal Skin: warm and dry  Neuro:  CNs 2-12 intact, no focal abnormalities noted Psych:  Normal affect   EKG:  The EKG was personally reviewed and demonstrates:  atrial fibrillation at 104 bpm Telemetry:  Telemetry was personally reviewed and demonstrates:  atrial fibrillation, rates largely  90s-110s  Relevant CV Studies: Echo from 09/04/2023, images personally reviewed today  Laboratory Data: High Sensitivity Troponin:   Recent Labs  Lab 10/24/23 1452 10/24/23 2002  TROPONINIHS 9 8     Chemistry Recent Labs  Lab 10/24/23 1123 10/25/23 0447 10/26/23 0541  NA 143 142 139  K 4.0 3.5 3.7  CL 105 107 104  CO2 22 22 26   GLUCOSE 163* 99 122*  BUN 26* 22 22  CREATININE 2.39* 2.00* 1.82*  CALCIUM  9.1 8.6* 8.6*  MG 1.8  --  1.9  GFRNONAA 27* 33* 37*  ANIONGAP 16* 13 9    No results for input(s): "PROT", "ALBUMIN ", "AST", "ALT", "ALKPHOS", "BILITOT" in the last 168 hours. Lipids No results for input(s): "CHOL", "TRIG", "HDL", "LABVLDL", "LDLCALC", "CHOLHDL" in the last 168 hours.  Hematology Recent Labs  Lab 10/24/23 1123 10/26/23 0541  WBC 13.2* 11.4*  RBC 4.55 4.68  HGB 12.2* 12.5*  HCT 38.5* 38.9*  MCV 84.6 83.1  MCH 26.8 26.7  MCHC 31.7 32.1  RDW 18.9* 19.3*  PLT 291 282   Thyroid  No results for input(s): "TSH", "FREET4" in the last 168 hours.  BNP Recent Labs  Lab 10/24/23 1123  BNP 411.2*    DDimer No results for input(s): "DDIMER" in the last 168 hours.  Radiology/Studies:  MR BRAIN WO CONTRAST Result Date: 10/26/2023 CLINICAL DATA:  83 year old male with altered mental status. EXAM: MRI HEAD WITHOUT CONTRAST TECHNIQUE: Multiplanar, multiecho pulse sequences of the brain and surrounding structures were obtained without intravenous contrast. COMPARISON:  Head CT 10/24/2023.  Brain MRI 11/12/2022. FINDINGS: Brain: No restricted diffusion to suggest acute infarction. No midline shift, mass effect, evidence of mass lesion, ventriculomegaly, extra-axial collection or acute intracranial hemorrhage. Cervicomedullary junction and pituitary are within normal limits. Cerebral volume not significantly changed from last year. Patchy mostly periventricular mild to moderate cerebral white matter T2 and FLAIR hyperintensity has not significantly changed. No  cortical encephalomalacia or chronic cerebral blood products identified. Deep gray nuclei, brainstem and cerebellum remain within normal limits for age. Vascular: Major intracranial vascular flow voids are stable since last year. Skull and upper cervical spine: Negative. Visualized bone marrow signal is within normal limits. Sinuses/Orbits: Stable, negative. Other: Negative visible scalp and face. IMPRESSION: 1. No acute intracranial abnormality. 2. Stable since last year. Mild to moderate for age white matter changes most commonly due to small vessel disease. Electronically Signed   By: Marlise Simpers M.D.   On: 10/26/2023 11:30   CT Lumbar Spine Wo Contrast Result Date: 10/24/2023 EXAM: CT OF THE LUMBAR SPINE WITHOUT CONTRAST 10/24/2023 05:53:58 PM  TECHNIQUE: CT of the lumbar spine was performed without the administration of intravenous contrast. Multiplanar reformatted images are provided for review. Automated exposure control, iterative reconstruction, and/or weight based adjustment of the mA/kV was utilized to reduce the radiation dose to as low as reasonably achievable. COMPARISON: Abdominal CT dated 12/23/2021. CLINICAL HISTORY: Back trauma, no prior imaging (Age >= 16y). Chief complaints; Shortness of Breath; CT Head Wo Contrast; Mental status change, unknown cause; CT Lumbar Spine Wo Contrast; Back trauma, no prior imaging (Age >= 16y) FINDINGS: BONES AND ALIGNMENT: Diminutive ribs at T12 with partial sacralization of the L5 vertebral segment. Acute compression fracture of the L4 vertebral body with 3 mm retropulsion of the superior endplate and mild height loss. Linear sclerosis underlying the L1 superior endplate, suggestive of subacute to chronic compression fracture with minimal height loss. Moderate degenerative changes of the bilateral sacroiliac joints. DEGENERATIVE CHANGES: Multilevel lumbar spondylosis, most significant at L2-3, where there is at least mild spinal canal stenosis. SOFT TISSUES:  Atherosclerotic calcifications of the abdominal aorta and its branches. Focal ectasia of the infrarenal aorta, measuring up to 3.2 cm on axial image 54 series 5. According to the guideline on Abdominal Aortic Aneurysms (AAA), the finding is consistent with a focal ectasia of the infrarenal aorta measuring 3.2 cm, which falls under the category of a small aneurysm. The recommendation is to follow up with imaging surveillance every 3 years, as per the SVS 2018 guidelines for aneurysms measuring 3.03.9 cm. IMPRESSION: 1. Acute compression fracture of the L4 vertebral body with 3 mm retropulsion of the superior endplate and mild height loss. 2. Linear sclerosis underlying the L1 superior endplate, suggestive of subacute to chronic compression fracture with minimal height loss. 3. Multilevel lumbar spondylosis, most significant at L2-3, where there is at least mild spinal canal stenosis. 4. Focal ectasia of the infrarenal aorta, measuring up to 3.2 cm, consistent with a small aneurysm. Follow-up imaging surveillance every 3 years is recommended, as per the SVS 2018 guidelines for aneurysms measuring 3.03.9 cm. Electronically signed by: Audra Blend MD 10/24/2023 06:30 PM EDT RP Workstation: [id number]   CT Head Wo Contrast Result Date: 10/24/2023 CLINICAL DATA:  Mental status change, unknown cause EXAM: CT HEAD WITHOUT CONTRAST TECHNIQUE: Contiguous axial images were obtained from the base of the skull through the vertex without intravenous contrast. RADIATION DOSE REDUCTION: This exam was performed according to the departmental dose-optimization program which includes automated exposure control, adjustment of the mA and/or kV according to patient size and/or use of iterative reconstruction technique. COMPARISON:  CT head 09/03/2023 FINDINGS: Brain: Patchy and confluent areas of decreased attenuation are noted throughout the deep and periventricular white matter of the cerebral hemispheres bilaterally, compatible  with chronic microvascular ischemic disease. No evidence of large-territorial acute infarction. No parenchymal hemorrhage. No mass lesion. No extra-axial collection. No mass effect or midline shift. No hydrocephalus. Basilar cisterns are patent. Vascular: No hyperdense vessel. Skull: No acute fracture or focal lesion. Sinuses/Orbits: Paranasal sinuses and mastoid air cells are clear. Bilateral lens replacement. Otherwise the orbits are unremarkable. Other: None. IMPRESSION: No acute intracranial abnormality. Electronically Signed   By: Morgane  Naveau M.D.   On: 10/24/2023 18:22   DG Chest 2 View Result Date: 10/24/2023 CLINICAL DATA:  Shortness of breath EXAM: CHEST - 2 VIEW COMPARISON:  Chest x-ray performed September 03, 2023 FINDINGS: Low lung volumes with enlarged heart central pulmonary vascular congestion. No significant pleural effusion. No pneumothorax. Osteopenia. Chronic bony deformities. IMPRESSION: 1. Enlarged heart with central  pulmonary vascular congestion. 2. No significant pleural effusion. Electronically Signed   By: Reagan Camera M.D.   On: 10/24/2023 12:28     Assessment and Plan:  Longstanding persistent vs permanent afib -with RVR on presentation, in the setting of acute illness as below -recent hospitalization with bradycardia, but has been tolerating metoprolol  succinate 100 mg daily, and given RVR, do not suspect that bradycardia was the etiology of his fall.  -continue apixaban  for chadsvasc=5 (was on 5 mg BID prior to admission, decreased to 2.5 mg BID given current renal function, likely will need to return to prior dosing once renal function normalizes)  Chronic diastolic heart failure Intermittent hypotension O2 requirement, new -weights inaccurate this admission. I/O also likely inaccurate (charted 500 ml blood out yesterday, suspect this was mischarted urine, patient/family deny any bleeding) -BNP elevated on admission, CXR with pulmonary vascular congestion -mild JVD  and very focal rales vs. Atelectasis on exam -however, clinically appears more consistent with being volume depleted on arrival -ordered repeat orthostatic vitals. Reviewed orthostatics from yesterday. Not orthostatic by BP, but HR rises ~40 bpm between lying and standing  Lying 112/71, HR 81  Sitting 128/76, HR 99  Standing 119/93, HR 120  -difficult situation. Has labile BP and signs of chronic heart failure, but also concerning for volume depletion given presentation. For now, agree with holding lasix . Long term will need to find a balance.  Lactic acidosis -on empiric antibiotics, though no source identified -lactate now normalized -unclear etiology to me. No clear infectious symptoms, though diarrhea recently an issue, ?volume depletion with this. Acute onset on 5/31 per daughter, was normal for several days after the fall.  Acute kidney injury -in the setting of the above. Also reports diarrhea -holding lasix , Cr downtrending  Fall Altered mental status -brain MRI without acute changes, shows mild to moderate changes consistent with small vessel disease -CT lumbar spine with acute compression fracture at L4 -mental status now back to baseline  Overall complex medical situation. Unclear trigger at this time. Discussed extensively with patient and family at bedside.   Risk Assessment/Risk Scores:       [place]  Heart Association (NYHA) Functional Class NYHA Class III  CHA2DS2-VASc Score = 5   This indicates a 7.2% annual risk of stroke. The patient's score is based upon: CHF History: 1 HTN History: 1 Diabetes History: 1 Stroke History: 0 Vascular Disease History: 0 Age Score: 2 Gender Score: 0        For questions or updates, please contact [facility] Please consult [email or web address] for contact info under    Signed, Sheryle Donning, MD  10/26/2023 11:58 AM

## 2023-10-26 NOTE — Evaluation (Addendum)
 Occupational Therapy Evaluation Patient Details Name: Steve Jensen MRN: [id number] DOB: Jun 18, 1940 Today's Date: 10/26/2023   History of Present Illness   83  y.o. male [facility] and found to be in a-fib RVR. Pt also with fall last Wednesday, head CT negative and MRI pending. AMS w/ suspected sepsis. Lumbar spine CT showed acute compression of L4 fx w/ 3 mm retropulsion. Prior admit 4/10 due to fall. PMH: a-fib, HTN, chronic HF-EF, OSA on CPAP, DM II, CKD III, GERD, hepatocellular carcinoma, prostate cancer.     Clinical Impressions Pt presents with decline in function and safety with ADLs and ADL mobility with impaired strength, balance, endurance and cognition. Pt requires increased time to initiate and complete tasks due to decreased attention and hyperverbose requiring redirection multiple times. PTA pt lived at an ALF and required assist from staff for dressing, bathing, and toileting, used RW for mobility but had difficulty taking steps since fall last week. Pt currently requires mod A to sit EOB, Amedee-total A with LB ADLs, CGA with UB ADLs/grooming and total A with toileting tasks. Donne back brace once pt seated EOB. HR increasing to 127 once seated EOB, decreased to 90s during activity seated EOB. Deferred sit- stand and transfers at this time due to safety. Pt would benefit from acute OT services to address impairments to maximize level of function and safety     If plan is discharge home, recommend the following:   A lot of help with bathing/dressing/bathroom;Assistance with cooking/housework;Direct supervision/assist for medications management;Direct supervision/assist for financial management;Assist for transportation;Help with stairs or ramp for entrance;Two people to help with walking and/or transfers     Functional Status Assessment   Patient has had a recent decline in their functional status and demonstrates the  ability to make significant improvements in function in a reasonable and predictable amount of time.     Equipment Recommendations   Other (comment) (defer)     Recommendations for Other Services         Precautions/Restrictions   Precautions Precautions: Back;Fall Precaution Booklet Issued: No Recall of Precautions/Restrictions: Impaired Precaution/Restrictions Comments: watch HR Required Braces or Orthoses: Spinal Brace Spinal Brace: Lumbar corset Restrictions Weight Bearing Restrictions Per Provider Order: No     Mobility Bed Mobility Overal bed mobility: Needs Assistance Bed Mobility: Rolling Rolling: Elwyn assist   Supine to sit: Allen assist Sit to supine: Total assist   General bed mobility comments: Arnet A to eleavte trunk, used rails, extra time, Treyveon A to lateral scoot towards rail, total A with LE mgt back onto bed    Transfers                   General transfer comment: Deferred 2/2 safety      Balance Overall balance assessment: History of Falls Sitting-balance support: Single extremity supported, Bilateral upper extremity supported, Feet supported Sitting balance-Leahy Scale: Fair                                     ADL either performed or assessed with clinical judgement   ADL   Eating/Feeding: Set up;Independent;Sitting   Grooming: Wash/dry hands;Wash/dry face;Contact guard assist;Sitting   Upper Body Bathing: Contact guard assist;Sitting   Lower Body Bathing: Maximal assistance   Upper Body Dressing : Contact guard assist;Sitting   Lower Body Dressing: Total assistance  Toileting- Clothing Manipulation and Hygiene: Total assistance;Bed level         General ADL Comments: Pt with decreased activity toleranc, back pain affecting funcitonal level.     Vision Baseline Vision/History: 1 Wears glasses Ability to See in Adequate Light: 0 Adequate Patient Visual Report: No change from baseline        Perception         Praxis         Pertinent Vitals/Pain Pain Assessment Pain Assessment: Faces Faces Pain Scale: Hurts even more Pain Location: low back Pain Descriptors / Indicators: Aching, Discomfort Pain Intervention(s): Limited activity within patient's tolerance, Monitored during session, Repositioned, Premedicated before session     Extremity/Trunk Assessment Upper Extremity Assessment Upper Extremity Assessment: Right hand dominant;Generalized weakness   Lower Extremity Assessment Lower Extremity Assessment: Defer to PT evaluation       Communication Communication Communication: Impaired   Cognition Arousal: Alert Behavior During Therapy: [facility] for tasks assessed/performed Cognition: No family/caregiver present to determine baseline         Attention impairment (select first level of impairment): Alternating attention Executive functioning impairment (select all impairments): Initiation, Problem solving OT - Cognition Comments: easily distracted, requires redirection, hyperverbose                 Following commands: Impaired Following commands impaired: Follows one step commands inconsistently, Follows one step commands with increased time     Cueing  General Comments   Cueing Techniques: Verbal cues;Tactile cues;Visual cues;Gestural cues      Exercises     Shoulder Instructions      Home Living Family/patient expects to be discharged to:: Assisted living       Home Access: Ramped entrance           Bathroom Shower/Tub: Walk-in shower   Bathroom Toilet: Handicapped height     Home Equipment: Retail banker (4 wheels);Cane - single point;BSC/3in1;Grab bars - tub/shower;Shower seat          Prior Functioning/Environment Prior Level of Function : Needs assist;History of Falls (last six months)             Mobility Comments: Prior to most recent fall, pt was independent with mobility with a RW. After fall, pt was  needing help to get out of bed and was having difficulty taking steps ADLs Comments: assist from staff for dressing, bathing, and toileting, used RW for mobility but had difficulty taking steps    OT Problem List: Decreased strength;Decreased activity tolerance;Impaired balance (sitting and/or standing);Decreased cognition;Decreased safety awareness;Decreased knowledge of use of DME or AE;Decreased knowledge of precautions;Cardiopulmonary status limiting activity   OT Treatment/Interventions: Self-care/ADL training;Therapeutic exercise;DME and/or AE instruction;Cognitive remediation/compensation;Patient/family education;Balance training;Therapeutic activities      OT Goals(Current goals can be found in the care plan section)   Acute Rehab OT Goals Patient Stated Goal: get better, go home OT Goal Formulation: With patient Time For Goal Achievement: 11/09/23 Potential to Achieve Goals: Good ADL Goals Pt Will Perform Grooming: with supervision;with set-up;sitting Pt Will Perform Upper Body Bathing: with supervision;with set-up;sitting Pt Will Perform Lower Body Bathing: with mod assist;sitting/lateral leans Pt Will Perform Upper Body Dressing: with supervision;with set-up;sitting Pt Will Transfer to Toilet: with Nikolas assist;with mod assist;stand pivot transfer;bedside commode   OT Frequency:  Min 2X/week    Co-evaluation              AM-PAC OT "6 Clicks" Daily Activity     Outcome Measure Help from another person eating meals?:  A Little Help from another person taking care of personal grooming?: A Little Help from another person toileting, which includes using toliet, bedpan, or urinal?: Total Help from another person bathing (including washing, rinsing, drying)?: A Lot Help from another person to put on and taking off regular upper body clothing?: A Little Help from another person to put on and taking off regular lower body clothing?: Total 6 Click Score: 13   End of Session  Nurse Communication: Mobility status  Activity Tolerance: Patient limited by fatigue Patient left: in bed;with call bell/phone within reach;with bed alarm set  OT Visit Diagnosis: Other abnormalities of gait and mobility (R26.89);Muscle weakness (generalized) (M62.81);Repeated falls (R29.6);History of falling (Z91.81);Other symptoms and signs involving cognitive function                Time: 1610-9604 OT Time Calculation (min): 27 min Charges:  OT General Charges $OT Visit: 1 Visit OT Evaluation $OT Eval Moderate Complexity: 1 Mod OT Treatments $Therapeutic Activity: 8-22 mins    Alfred Ann 10/26/2023, 2:53 PM

## 2023-10-27 DIAGNOSIS — R404 Transient alteration of awareness: Secondary | ICD-10-CM | POA: Diagnosis not present

## 2023-10-27 DIAGNOSIS — I5032 Chronic diastolic (congestive) heart failure: Secondary | ICD-10-CM | POA: Diagnosis not present

## 2023-10-27 DIAGNOSIS — I4819 Other persistent atrial fibrillation: Secondary | ICD-10-CM

## 2023-10-27 LAB — CBC
HCT: 39.2 % (ref 39.0–52.0)
Hemoglobin: 12.7 g/dL — ABNORMAL LOW (ref 13.0–17.0)
MCH: 26.9 pg (ref 26.0–34.0)
MCHC: 32.4 g/dL (ref 30.0–36.0)
MCV: 83.1 fL (ref 80.0–100.0)
Platelets: 309 10*3/uL (ref 150–400)
RBC: 4.72 MIL/uL (ref 4.22–5.81)
RDW: 19.2 % — ABNORMAL HIGH (ref 11.5–15.5)
WBC: 13.5 10*3/uL — ABNORMAL HIGH (ref 4.0–10.5)
nRBC: 0 % (ref 0.0–0.2)

## 2023-10-27 LAB — BASIC METABOLIC PANEL WITH GFR
Anion gap: 10 (ref 5–15)
BUN: 22 mg/dL (ref 8–23)
CO2: 22 mmol/L (ref 22–32)
Calcium: 8.6 mg/dL — ABNORMAL LOW (ref 8.9–10.3)
Chloride: 107 mmol/L (ref 98–111)
Creatinine, Ser: 1.74 mg/dL — ABNORMAL HIGH (ref 0.61–1.24)
GFR, Estimated: 39 mL/min — ABNORMAL LOW (ref >60)
Glucose, Bld: 131 mg/dL — ABNORMAL HIGH (ref 70–99)
Potassium: 3.2 mmol/L — ABNORMAL LOW (ref 3.5–5.1)
Sodium: 139 mmol/L (ref 135–145)

## 2023-10-27 LAB — GLUCOSE, CAPILLARY
Glucose-Capillary: 149 mg/dL — ABNORMAL HIGH (ref 70–99)
Glucose-Capillary: 183 mg/dL — ABNORMAL HIGH (ref 70–99)
Glucose-Capillary: 180 mg/dL — ABNORMAL HIGH (ref 70–99)

## 2023-10-27 LAB — MAGNESIUM: Magnesium: 1.9 mg/dL (ref 1.7–2.4)

## 2023-10-27 MED ORDER — MELATONIN 3 MG PO TABS: 3.0000 mg | ORAL_TABLET | Freq: Every day | ORAL | Status: DC

## 2023-10-27 MED ORDER — ZOLPIDEM TARTRATE 5 MG PO TABS
5.0000 mg | ORAL_TABLET | Freq: Every evening | ORAL | Status: DC | PRN
  Administered 2023-10-27: 5 mg via ORAL
  Filled 2023-10-27: qty 1

## 2023-10-27 MED ORDER — POTASSIUM CHLORIDE CRYS ER 20 MEQ PO TBCR
40.0000 meq | EXTENDED_RELEASE_TABLET | Freq: Once | ORAL | Status: AC
  Administered 2023-10-27: 40 meq via ORAL
  Filled 2023-10-27: qty 2

## 2023-10-27 MED ORDER — METOPROLOL TARTRATE 12.5 MG HALF TABLET
12.5000 mg | ORAL_TABLET | Freq: Four times a day (QID) | ORAL | Status: DC
  Administered 2023-10-27 – 2023-10-29 (×8): 12.5 mg via ORAL
  Filled 2023-10-27 (×8): qty 1

## 2023-10-27 MED ORDER — POTASSIUM CHLORIDE CRYS ER 20 MEQ PO TBCR
40.0000 meq | EXTENDED_RELEASE_TABLET | Freq: Once | ORAL | Status: AC
  Administered 2023-10-27: 40 meq via ORAL

## 2023-10-27 NOTE — Progress Notes (Signed)
 Physical Therapy Treatment Patient Details Name: Steve Jensen MRN: [id number] DOB: 06/07/1940 Today's Date: 10/27/2023   History of Present Illness 83  y.o. male [facility] and found to be in a-fib RVR. Pt also with fall last Wednesday, head CT negative and MRI pending. AMS w/ suspected sepsis. Lumbar spine CT showed acute compression of L4 fx w/ 3 mm retropulsion. Prior admit 4/10 due to fall. PMH: a-fib, HTN, chronic HF-EF, OSA on CPAP, DM II, CKD III, GERD, hepatocellular carcinoma, prostate cancer.    PT Comments  Pt is pleasantly confused, tangential in his conversation, but with increased redirection is able to participate in therapy. Pt is maxAx2 for bed mobility. Once on EoB able to don Lumbar Corset. Pt is maxAx2 for coming to standing. Pt is unable to tolerate due to back pain and complaints about Jensen socks. Pt sat back down and is able to perform lateral scoots to HoB with modAx2 and use of bed pad to come. D/c plan remains appropriate at this time. PT will continue to follow acutely.     If plan is discharge home, recommend the following: Help with stairs or ramp for entrance;Assist for transportation;Assistance with cooking/housework;Two people to help with walking and/or transfers;A lot of help with bathing/dressing/bathroom   Can travel by private vehicle     No  Equipment Recommendations  None recommended by PT       Precautions / Restrictions Precautions Precautions: Back;Fall Precaution Booklet Issued: No Recall of Precautions/Restrictions: Impaired Precaution/Restrictions Comments: watch HR Required Braces or Orthoses: Spinal Brace Spinal Brace: Lumbar corset Restrictions Weight Bearing Restrictions Per Provider Order: No     Mobility  Bed Mobility Overal bed mobility: Needs Assistance Bed Mobility: Rolling Rolling: Steve Jensen assist, +2 for physical assistance   Supine to sit: Steve Jensen assist, +2 for physical  assistance Sit to supine: +2 for physical assistance, Steve Jensen assist   General bed mobility comments: maxAx2 to roll onto side and push up to seated,    Transfers Overall transfer level: Needs assistance Equipment used: Rolling walker (2 wheels) Transfers: Sit to/from Stand, Bed to chair/wheelchair/BSC Sit to Stand: Steve Jensen assist, +2 physical assistance           General transfer comment: maxAx2 with use of bed pads and momentum to come to standing, pt unable to take steps due to back pain    Ambulation/Gait               General Gait Details: unable to progress due to fatigue after using BSC         Balance Overall balance assessment: History of Falls Sitting-balance support: Single extremity supported, Bilateral upper extremity supported, Feet supported Sitting balance-Leahy Scale: Fair Sitting balance - Comments: cannot reach outside BoS without CGA   Standing balance support: Bilateral upper extremity supported, During functional activity, Reliant on assistive device for balance Standing balance-Leahy Scale: Fair Standing balance comment: pt needs B UE support for and some outside support for balance in standing                            Communication Communication Communication: Impaired Factors Affecting Communication: Difficulty expressing self (long winded and often does not get to the point of what he is trying to say)  Cognition Arousal: Alert Behavior During Therapy: Steve Jensen for tasks assessed/performed   PT - Cognitive impairments: Orientation, Awareness, Memory, Attention, Initiation, Sequencing, Problem solving, Safety/Judgement  Orientation impairments: Person, Situation                   PT - Cognition Comments: pt pleasantly confused, starts stories and then looses train of thought Following commands: Impaired Following commands impaired: Follows one step commands inconsistently, Follows one step commands with increased time     Cueing Cueing Techniques: Verbal cues, Tactile cues, Visual cues, Gestural cues     General Comments General comments (skin integrity, edema, etc.): VSS on 2L O2 via Steve Jensen      Pertinent Vitals/Pain Pain Assessment Pain Assessment: Faces Faces Pain Scale: Hurts whole lot Breathing: normal Negative Vocalization: none Facial Expression: smiling or inexpressive Body Language: relaxed Consolability: no need to console PAINAD Score: 0 Pain Location: low back Pain Descriptors / Indicators: Aching, Discomfort Pain Intervention(s): Limited activity within patient's tolerance, Monitored during session, Repositioned    Home Living Family/patient expects to be discharged to:: Assisted living                            PT Goals (current goals can now be found in the care plan section) Acute Rehab PT Goals Patient Stated Goal: pt unable to state goal PT Goal Formulation: Patient unable to participate in goal setting Time For Goal Achievement: 11/08/23 Potential to Achieve Goals: Fair Progress towards PT goals: Progressing toward goals    Frequency    Min 2X/week       AM-PAC PT "6 Clicks" Mobility   Outcome Measure  Help needed turning from your back to your side while in a flat bed without using bedrails?: Total Help needed moving from lying on your back to sitting on the side of a flat bed without using bedrails?: Total Help needed moving to and from a bed to a chair (including a wheelchair)?: Total Help needed standing up from a chair using your arms (e.g., wheelchair or bedside chair)?: Total Help needed to walk in Jensen room?: Total Help needed climbing 3-5 steps with a railing? : Total 6 Click Score: 6    End of Session Equipment Utilized During Treatment: Gait belt Activity Tolerance: Other (comment) (cognition) Patient left: in bed;with call bell/phone within reach;with bed alarm set Nurse Communication: Mobility status PT Visit Diagnosis: Unsteadiness  on feet (R26.81);Other abnormalities of gait and mobility (R26.89);Repeated falls (R29.6);Muscle weakness (generalized) (M62.81)     Time: 9604-5409 PT Time Calculation (min) (ACUTE ONLY): 33 min  Charges:    $Therapeutic Activity: 23-37 mins PT General Charges $$ ACUTE PT VISIT: 1 Visit                     Lemmie Vanlanen B. Jewel Mortimer PT, DPT Acute Rehabilitation Services Please use secure chat or  Call Office [phone]    Steve [facility] 10/27/2023, 11:55 AM

## 2023-10-27 NOTE — Plan of Care (Signed)
   Problem: Education: Goal: Ability to describe self-care measures that may prevent or decrease complications (Diabetes Survival Skills Education) will improve Outcome: Progressing Goal: Individualized Educational Video(s) Outcome: Progressing   Problem: Coping: Goal: Ability to adjust to condition or change in health will improve Outcome: Progressing

## 2023-10-27 NOTE — Progress Notes (Signed)
 Patient declined CPAP for the night

## 2023-10-27 NOTE — Progress Notes (Signed)
 SATURATION QUALIFICATIONS: (This note is used to comply with regulatory documentation for home oxygen )   Patient Saturations on Room Air at Rest = 87%   Patient Saturations on Room Air while Ambulating = not tested as patient desaturated at rest   Patient Saturations on 2 Liters of oxygen   = 92%   Please briefly explain why patient needs home oxygen : patient is requiring 2 liters of oxygen  to maintain oxygen  saturations >92%

## 2023-10-27 NOTE — Progress Notes (Addendum)
 PROGRESS NOTE  Steve Jensen WUJ:[id number] DOB: December 24, 1940 DOA: 10/24/2023 PCP: Glena Landau, MD   LOS: 3 days   Brief narrative:  Steve Jensen is a 83 y.o. male with past medical history atrial fibrillation on Eliquis , hypertension, chronic heart failure with preserved ejection, OSA on CPAP, obesity, type 2 diabetes melitis, CKD stage IIIa, GERD, hepatocellular carcinoma, history of prostate cancer, falls, presented to the hospital from skilled nursing facility with complaints of shortness of breath.  Patient was noted to be hypoxic with pulse ox of 88% on room air on arrival.  He was also noted to have A-fib with RVR and received IV fluids.  Of note patient was last hospitalized in April 2025 for syncope and concerns for bradycardia.  At that time Cardizem  was discontinued and a Holter monitor was placed.  Patient was subsequently seen on 27th May 2025 by cardiology and was continued on metoprolol  with Lasix  and Zetia .  Patient did not complete his Holter monitor.  On this presentation, at the  skilled nursing facility patient was noted to be very weak and disoriented and blood pressure was low.  Subsequently, had a fall.  In the ED, blood pressure was better.  Labs showed creatinine of 2.3 with lactate elevation at 3.6.  BNP elevated at 411.  Review of previous 2D echocardiogram from 09/04/2023 showed LV ejection fraction of 55 to 60%.  Patient was then considered for admission to the hospital for further evaluation and treatment.    Assessment/Plan: Principal Problem:   AMS (altered mental status) Active Problems:   Severe sepsis with acute organ dysfunction (HCC)   Essential hypertension, benign   PAF (paroxysmal atrial fibrillation) (HCC)   Uncontrolled type 2 diabetes mellitus with hyperglycemia, without long-term current use of insulin  (HCC)   AKI (acute kidney injury) (HCC)   Chronic heart failure with preserved ejection fraction (HFpEF) (HCC)   SOB (shortness of  breath)  Altered mental status. Transient.  Likely secondary to hypotension.  MRI of the brain without any acute findings.  Patient likely at baseline.  Alert awake and Communicative.  Hypotension Improved.  None orthostatic on checking.  Seen by cardiology.  Still Lasix  on hold.  Metoprolol  has been resumed.  SIRS without sepsis. No obvious signs of infection.  Initially patient had leukocytosis with AKI and lactic acidosis.  Lactate has improved at this time.   Blood cultures negative in 3 days.  Urine culture insignificant.     Chest x-ray without any infiltrate.  MRSA PCR negative.  Urinalysis was negative for infection.  Antibiotics have been discontinued.  Diarrhea.  C. difficile testing pending.  Likely improved.  Hypokalemia.  Potassium of 3.2.  Continue to replenish.  AKI: Baseline creatinine around 1.3-1.4.  Creatinine on presentation at 2.3.  Received some IV fluids.  Creatinine today at 1.7.  Will continue to monitor closely.  Intake and output charting.   Falls/ Acute compression fracture of the L4 : CT head without any acute findings.  Patient complained of lower back pain show CT of the lumbar spine showed acute compression of the L4 fracture with 3 mm retropulsion.  Neurosurgery on-call was consulted by ED who  recommended LSO brace and outpatient follow-up. PT OT recommends skilled nursing facility placement.  Cardiology recommends a heart monitor at discharge.   Anemia.   Hemoglobin of 12.2 on presentation.  Will continue to monitor CBC.  Hemoglobin today at 12.57    History of chronic heart failure with preserved ejection fraction. 2D echocardiogram  with LV EF 55-60 %..  Was on Lasix  and metoprolol  at home.  Currently Lasix  on hold.  Latest blood pressure of 112/ 82.  Continue intake and output charting Daily weights.  On supplemental oxygen  at this time.  Metoprolol  has been initiated.    Diabetes mellitus type II. Continue sliding scale insulin .  Closely monitor blood  glucose levels.  Latest POC glucose of 183    Paroxysmal atrial fibrillation with RVR  Improved.  Continue Eliquis  and metoprolol   Lactic acidosis: Differential diagnosis include metformin  related, hypotension related.  Received gentle IV fluids.  Has improved at this time.  Class III obesity.  Would benefit from lifestyle modification and weight loss.  Body mass index is 41.09 kg/m.   DVT prophylaxis: apixaban  (ELIQUIS ) tablet 2.5 mg Start: 10/24/23 2200 apixaban  (ELIQUIS ) tablet 2.5 mg   Disposition: Skilled nursing facility in 1 to 2 days  Status is: Inpatient Remains inpatient appropriate because:  pending clinical improvement, skilled nursing facility resident.   Code Status:     Code Status: Limited: Do not attempt resuscitation (DNR) -DNR-LIMITED -Do Not Intubate/DNI   Family Communication: spoke with the patient's daughter on the phone and updated her about the clinical condition of the patient.   Consultants: Curbside neurosurgery Cardiology  Procedures: None  Anti-infectives:  Vancomycin and Rocephin  IV-discontinued  Subjective: Today, patient was seen and examined at bedside.   States that he could not sleep last night.  Denies any pain, nausea, vomiting, fever, chills or rigor.  Does not remember if he had any bowel movements.  Poor historian.    Objective:  Vitals:   10/27/23 0725 10/27/23 1130  BP: 110/68 128/82  Pulse: (!) 108 99  Resp: 20 (!) 24  Temp: 99 F (37.2 C) (!) 97.5 F (36.4 C)  SpO2: 92% 94%    Intake/Output Summary (Last 24 hours) at 10/27/2023 1347 Last data filed at 10/27/2023 1138 Gross per 24 hour  Intake 720 ml  Output 1000 ml  Net -280 ml   Filed Weights   10/25/23 0507 10/26/23 0500 10/27/23 0425  Weight: 128.2 kg (!) 137.4 kg 126.2 kg   Body mass index is 41.09 kg/m.   Physical Exam:  GENERAL: Patient is alert awake and oriented to place.  Poor historian.  Not in obvious distress.  Obese built, on nasal cannula oxygen   at 4 L/min., elderly male HENT: No scleral pallor or icterus. Pupils equally reactive to light. Oral mucosa is moist NECK: is supple, no gross swelling noted. CHEST:   Diminished breath sounds bilaterally. CVS: S1 and S2 heard, no murmur.  Irregular rhythm. ABDOMEN: Soft, non-tender, bowel sounds are present.  Lower back tenderness on palpation EXTREMITIES: No edema. CNS: Cranial nerves are intact.  Moves extremities. SKIN: warm and dry without rashes.  Data Review: I have personally reviewed the following laboratory data and studies,  CBC: Recent Labs  Lab 10/24/23 1123 10/26/23 0541 10/27/23 0531  WBC 13.2* 11.4* 13.5*  NEUTROABS 9.5*  --   --   HGB 12.2* 12.5* 12.7*  HCT 38.5* 38.9* 39.2  MCV 84.6 83.1 83.1  PLT 291 282 309   Basic Metabolic Panel: Recent Labs  Lab 10/24/23 1123 10/25/23 0447 10/26/23 0541 10/27/23 0531  NA 143 142 139 139  K 4.0 3.5 3.7 3.2*  CL 105 107 104 107  CO2 22 22 26 22   GLUCOSE 163* 99 122* 131*  BUN 26* 22 22 22   CREATININE 2.39* 2.00* 1.82* 1.74*  CALCIUM  9.1 8.6* 8.6*  8.6*  MG 1.8  --  1.9 1.9   Liver Function Tests: No results for input(s): "AST", "ALT", "ALKPHOS", "BILITOT", "PROT", "ALBUMIN " in the last 168 hours. No results for input(s): "LIPASE", "AMYLASE" in the last 168 hours. No results for input(s): "AMMONIA" in the last 168 hours. Cardiac Enzymes: No results for input(s): "CKTOTAL", "CKMB", "CKMBINDEX", "TROPONINI" in the last 168 hours. BNP (last 3 results) Recent Labs    11/11/22 1210 09/03/23 2103 10/24/23 1123  BNP 457.0* 152.3* 411.2*    ProBNP (last 3 results) No results for input(s): "PROBNP" in the last 8760 hours.  CBG: Recent Labs  Lab 10/26/23 1215 10/26/23 1658 10/26/23 2135 10/27/23 0808 10/27/23 1136  GLUCAP 121* 164* 166* 149* 183*   Recent Results (from the past 240 hours)  Culture, blood (Routine X 2) w Reflex to ID Panel     Status: None (Preliminary result)   Collection Time: 10/24/23   2:51 PM   Specimen: BLOOD  Result Value Ref Range Status   Specimen Description BLOOD RIGHT ANTECUBITAL  Final   Special Requests   Final    BOTTLES DRAWN AEROBIC AND ANAEROBIC Blood Culture adequate volume   Culture   Final    NO GROWTH 3 DAYS Performed at [facility], [address] [place], [place] [address]    Report Status PENDING  Incomplete  Culture, blood (Routine X 2) w Reflex to ID Panel     Status: None (Preliminary result)   Collection Time: 10/24/23  2:56 PM   Specimen: BLOOD  Result Value Ref Range Status   Specimen Description BLOOD LEFT ANTECUBITAL  Final   Special Requests   Final    BOTTLES DRAWN AEROBIC AND ANAEROBIC Blood Culture results may not be optimal due to an inadequate volume of blood received in culture bottles   Culture   Final    NO GROWTH 3 DAYS Performed at [facility], [address] [place], [place] [address]    Report Status PENDING  Incomplete  Urine Culture (for pregnant, neutropenic or urologic patients or patients with an indwelling urinary catheter)     Status: Abnormal   Collection Time: 10/24/23 10:23 PM   Specimen: Urine, Clean Catch  Result Value Ref Range Status   Specimen Description URINE, CLEAN CATCH  Final   Special Requests NONE  Final   Culture (A)  Final    <10,000 COLONIES/mL INSIGNIFICANT GROWTH Performed at [facility], [address], [place], [place] [address]    Report Status 10/25/2023 FINAL  Final  MRSA Next Gen by PCR, Nasal     Status: None   Collection Time: 10/24/23 10:23 PM   Specimen: Urine, Clean Catch; Nasal Swab  Result Value Ref Range Status   MRSA by PCR Next Gen NOT DETECTED NOT DETECTED Final    Comment: (NOTE) The GeneXpert MRSA Assay (FDA approved for NASAL specimens only), is one component of a comprehensive MRSA colonization surveillance program. It is not intended to diagnose MRSA infection nor to guide or monitor treatment for MRSA infections. Test performance is not FDA  approved in patients less than 47 years old. Performed at [facility], [address] [place], [place] [address]      Studies: MR BRAIN WO CONTRAST Result Date: 10/26/2023 CLINICAL DATA:  83 year old male with altered mental status. EXAM: MRI HEAD WITHOUT CONTRAST TECHNIQUE: Multiplanar, multiecho pulse sequences of the brain and surrounding structures were obtained without intravenous contrast. COMPARISON:  Head  CT 10/24/2023.  Brain MRI 11/12/2022. FINDINGS: Brain: No restricted diffusion to suggest acute infarction. No midline shift, mass effect, evidence of mass lesion, ventriculomegaly, extra-axial collection or acute intracranial hemorrhage. Cervicomedullary junction and pituitary are within normal limits. Cerebral volume not significantly changed from last year. Patchy mostly periventricular mild to moderate cerebral white matter T2 and FLAIR hyperintensity has not significantly changed. No cortical encephalomalacia or chronic cerebral blood products identified. Deep gray nuclei, brainstem and cerebellum remain within normal limits for age. Vascular: Major intracranial vascular flow voids are stable since last year. Skull and upper cervical spine: Negative. Visualized bone marrow signal is within normal limits. Sinuses/Orbits: Stable, negative. Other: Negative visible scalp and face. IMPRESSION: 1. No acute intracranial abnormality. 2. Stable since last year. Mild to moderate for age white matter changes most commonly due to small vessel disease. Electronically Signed   By: Marlise Simpers M.D.   On: 10/26/2023 11:30      Rosena Conradi, MD  [facility] 10/27/2023  If 7PM-7AM, please contact night-coverage

## 2023-10-27 NOTE — Progress Notes (Signed)
 Progress Note  Patient Name: Steve Jensen Date of Encounter: 10/27/2023 [facility] Cardiologist: Sheryle Donning, MD   Interval Summary   Nurse reports that he really did not sleep last night.  Seems a little confused and disoriented, but able to answer simple commands but does not know why he is here or any events leading up to this point.  Vital Signs Vitals:   10/27/23 0012 10/27/23 0357 10/27/23 0425 10/27/23 0725  BP: 118/77 122/78  110/68  Pulse: 98 (!) 107 61 (!) 108  Resp: 20 20 17 20   Temp: 98.3 F (36.8 C) 98.5 F (36.9 C)  99 F (37.2 C)  TempSrc: Oral Oral  Oral  SpO2: 96% 92% 95% 92%  Weight:   126.2 kg   Height:        Intake/Output Summary (Last 24 hours) at 10/27/2023 0807 Last data filed at 10/27/2023 0358 Gross per 24 hour  Intake 483 ml  Output 550 ml  Net -67 ml      10/27/2023    4:25 AM 10/26/2023    5:00 AM 10/25/2023    5:07 AM  Last 3 Weights  Weight (lbs) 278 lb 3.5 oz 302 lb 14.6 oz 282 lb 10.1 oz  Weight (kg) 126.2 kg 137.4 kg 128.2 kg      Telemetry/ECG  Atrial fibrillation heart rates generally around 110.  Briefly into the 120s- Personally Reviewed  Physical Exam  GEN: No acute distress.   Neck: Positive JVD Cardiac: IRRR, no murmurs, rubs, or gallops.  Respiratory: Clear to auscultation bilaterally, wheezing GI: Soft, nontender, non-distended  MS: 1+ edema  Patient Profile Patient with past medical history significant for longstanding persistent atrial fibrillation, chronic HFpEF, CKD, hepatocellular carcinoma, GERD, prostate cancer, type 2 diabetes, OSA.  Patient currently here for A-fib RVR, hypotension, fall.  Patient also recently admitted in April for syncope related to AKI, possible orthostatic.  Assessment & Plan   Longstanding persistent atrial fibrillation Likely permanent at this point.  Initially presented with RVR in the setting of AKI.  Rates reasonably controlled around 110 now.  He is on Eliquis  2.5  mg twice daily reduced for renal function and age.  He has had 2 admissions now in the past 3 months for fall, AKI.  At baseline has some confusion.  If he has further episodes, discussion of long-term anticoagulation needs to be had. Continue Toprol -XL 100 mg Normal TSH 1 month ago  Chronic HFpEF Hypotension Seems to be mildly volume up now but difficult because labs and overall clinical presentation suggests AKI/volume depletion.  Given absence of symptoms I think it is reasonable to continue to hold off on diuresis for the time being and allow renal function to normalize.  He is 228lb here, 5/27 was 285.  AKI Exacerbated by diarrhea as well.  Creatinine is downtrending.  1.7 today.  As admission creatinine was 1.35  Fall with compression fracture AMS Patient is confused, has no recollection of the events.  Starting to become more frequent in the setting of AKI, hypotension.  Anticoagulation discussion as above.  History of syncope with hospitalization April 2025.  Did not complete heart monitor.  No further incidents of syncope though.   Would perform heart monitoring at discharge Brain MRI without any acute findings  OSA Follows with Dr. Micael Adas  Hypokalemia Continue to replete per primary team.  Hypoxia O2 sats 85ish. He has smoking history 50+ years so wonder if he has some degree of COPD.  Wheezing on  today's exam.  Further management per primary team.    For questions or updates, please contact [facility] Please consult [email or web address] for contact info under       Signed, Burnetta Cart, PA-C

## 2023-10-27 NOTE — Progress Notes (Signed)
 Pt had BM, clear mucous, jelly like consistency, no solid pieces and no color. Pt reported having this BMs for 2-3 months, and no loss of appetite.  Not in distress, VS stable, denied abdominal pain and discomfort. Will notify day shift team in am.

## 2023-10-27 NOTE — TOC Progression Note (Addendum)
 Transition of Care [facility]) - Progression Note    Patient Details  Name: Steve Jensen MRN: [id number] Date of Birth: [birth year]  [facility]) CM/SW Contact  Carmon Christen, LCSWA Phone Number: 10/27/2023, 10:40 AM  Clinical Narrative:     CSW spoke with Kristin at [facility]. Kristin is checking to verify how many rehab days patient has left to help daughter better determine if she would like for patient to go to short term rehab vrs. Returnning back to ALF side with HH PT/OT. CSW will continue to follow.  Update- CSW received call back from Kristin with [facility] who confirmed patient would be in copay days,has used 28 days for SNF. CSW informed patients spouse Steve Jensen. Steve Jensen informed CSW that she would like for patient to return to [facility] with HH/PT orders. CSW informed MD. Steve Jensen informed CSW that when patient medically stable that patient will transport by PTAR back to ALF. Patients spouse confirmed patient has cpap that he uses at home. CSW will continue to follow.  Expected Discharge Plan: Skilled Nursing Facility Barriers to Discharge: Continued Medical Work up, English as a second language teacher  Expected Discharge Plan and Services       Living arrangements for the past 2 months: Assisted Living Facility                                       Social Determinants of Health (SDOH) Interventions SDOH Screenings   Food Insecurity: No Food Insecurity (10/25/2023)  Housing: Low Risk  (10/25/2023)  Transportation Needs: No Transportation Needs (10/25/2023)  Utilities: Not At Risk (10/25/2023)  Social Connections: Socially Isolated (10/27/2023)  Tobacco Use: Medium Risk (10/24/2023)    Readmission Risk Interventions    12/26/2021   12:51 PM 12/25/2021    3:31 PM 12/19/2021   12:39 PM  Readmission Risk Prevention Plan  Transportation Screening  Complete Complete  PCP or Specialist Appt within 5-7 Days Complete    Home Care Screening  Complete   Medication  Review (RN CM)  Complete   HRI or Home Care Consult   Complete  Social Work Consult for Recovery Care Planning/Counseling   Complete  Palliative Care Screening   Not Applicable  Medication Review Oceanographer)   Complete

## 2023-10-28 DIAGNOSIS — R404 Transient alteration of awareness: Secondary | ICD-10-CM | POA: Diagnosis not present

## 2023-10-28 DIAGNOSIS — I482 Chronic atrial fibrillation, unspecified: Secondary | ICD-10-CM

## 2023-10-28 DIAGNOSIS — E876 Hypokalemia: Secondary | ICD-10-CM

## 2023-10-28 DIAGNOSIS — N179 Acute kidney failure, unspecified: Secondary | ICD-10-CM | POA: Diagnosis not present

## 2023-10-28 DIAGNOSIS — I5032 Chronic diastolic (congestive) heart failure: Secondary | ICD-10-CM | POA: Diagnosis not present

## 2023-10-28 LAB — CBC
HCT: 40.4 % (ref 39.0–52.0)
Hemoglobin: 13 g/dL (ref 13.0–17.0)
MCH: 26.6 pg (ref 26.0–34.0)
MCHC: 32.2 g/dL (ref 30.0–36.0)
MCV: 82.8 fL (ref 80.0–100.0)
Platelets: 364 10*3/uL (ref 150–400)
RBC: 4.88 MIL/uL (ref 4.22–5.81)
RDW: 18.9 % — ABNORMAL HIGH (ref 11.5–15.5)
WBC: 17.7 10*3/uL — ABNORMAL HIGH (ref 4.0–10.5)
nRBC: 0 % (ref 0.0–0.2)

## 2023-10-28 LAB — BASIC METABOLIC PANEL WITH GFR
Anion gap: 12 (ref 5–15)
BUN: 26 mg/dL — ABNORMAL HIGH (ref 8–23)
CO2: 21 mmol/L — ABNORMAL LOW (ref 22–32)
Calcium: 9 mg/dL (ref 8.9–10.3)
Chloride: 106 mmol/L (ref 98–111)
Creatinine, Ser: 1.66 mg/dL — ABNORMAL HIGH (ref 0.61–1.24)
GFR, Estimated: 41 mL/min — ABNORMAL LOW (ref >60)
Glucose, Bld: 164 mg/dL — ABNORMAL HIGH (ref 70–99)
Potassium: 3.5 mmol/L (ref 3.5–5.1)
Sodium: 139 mmol/L (ref 135–145)

## 2023-10-28 LAB — GLUCOSE, CAPILLARY
Glucose-Capillary: 197 mg/dL — ABNORMAL HIGH (ref 70–99)
Glucose-Capillary: 165 mg/dL — ABNORMAL HIGH (ref 70–99)
Glucose-Capillary: 225 mg/dL — ABNORMAL HIGH (ref 70–99)
Glucose-Capillary: 177 mg/dL — ABNORMAL HIGH (ref 70–99)
Glucose-Capillary: 247 mg/dL — ABNORMAL HIGH (ref 70–99)

## 2023-10-28 MED ORDER — FUROSEMIDE 10 MG/ML IJ SOLN
40.0000 mg | Freq: Once | INTRAMUSCULAR | Status: AC
  Administered 2023-10-28: 40 mg via INTRAVENOUS
  Filled 2023-10-28: qty 4

## 2023-10-28 MED ORDER — POTASSIUM CHLORIDE CRYS ER 20 MEQ PO TBCR
40.0000 meq | EXTENDED_RELEASE_TABLET | Freq: Once | ORAL | Status: AC
  Administered 2023-10-28: 40 meq via ORAL
  Filled 2023-10-28: qty 2

## 2023-10-28 MED ORDER — POTASSIUM CHLORIDE 20 MEQ PO PACK
40.0000 meq | PACK | Freq: Once | ORAL | Status: AC
  Administered 2023-10-28: 40 meq via ORAL
  Filled 2023-10-28: qty 2

## 2023-10-28 MED ORDER — ORAL CARE MOUTH RINSE: 15.0000 mL | OROMUCOSAL | Status: DC | PRN

## 2023-10-28 MED ORDER — FUROSEMIDE 40 MG PO TABS
40.0000 mg | ORAL_TABLET | Freq: Every day | ORAL | Status: DC
  Administered 2023-10-29 – 2023-10-30 (×2): 40 mg via ORAL
  Filled 2023-10-28 (×2): qty 1

## 2023-10-28 NOTE — TOC Progression Note (Addendum)
 Transition of Care [facility]) - Progression Note    Patient Details  Name: Steve Jensen MRN: [id number] Date of Birth: [birth year]  Transition of Care [facility]) CM/SW Contact  Carmon Christen, LCSWA Phone Number: 10/28/2023, 10:32 AM  Clinical Narrative:     CSW called patients daughter-n-law to follow up on dc plan for patient. [name] is considering patient to go [facility]. [name] is concerned patient may loose his ALF bed if he goes to short term rehab. [name] requested that CSW have Amadeo Backers with [facility] to give her a call to confirm that patient will not loose his ALF bed if he goes to short term rehab. CSW informed Amadeo Backers with [facility]. Amadeo Backers informed CSW patient will not loose his ALF bed if he goes to short term rehab. Kristin confirmed she will give [name] a call.  Update- [name] confirmed she does want patient to go to short term rehab at [facility]. CSW started insurance authorization for patient. Auth ID# [id number]. Insurance authorization currently pending.  Expected Discharge Plan: Skilled Nursing Facility Barriers to Discharge: Continued Medical Work up, English as a second language teacher  Expected Discharge Plan and Services       Living arrangements for the past 2 months: Assisted Living Facility                                       Social Determinants of Health (SDOH) Interventions SDOH Screenings   Food Insecurity: No Food Insecurity (10/25/2023)  Housing: Low Risk  (10/25/2023)  Transportation Needs: No Transportation Needs (10/25/2023)  Utilities: Not At Risk (10/25/2023)  Social Connections: Socially Isolated (10/27/2023)  Tobacco Use: Medium Risk (10/24/2023)    Readmission Risk Interventions    12/26/2021   12:51 PM 12/25/2021    3:31 PM 12/19/2021   12:39 PM  Readmission Risk Prevention Plan  Transportation Screening  Complete Complete  PCP or Specialist Appt within 5-7 Days Complete    Home Care Screening  Complete   Medication  Review (RN CM)  Complete   HRI or Home Care Consult   Complete  Social Work Consult for Recovery Care Planning/Counseling   Complete  Palliative Care Screening   Not Applicable  Medication Review Oceanographer)   Complete

## 2023-10-28 NOTE — Progress Notes (Signed)
 PROGRESS NOTE  Steve Jensen:[id number] DOB: Jul 20, 1940 DOA: 10/24/2023 PCP: Glena Landau, MD   LOS: 4 days   Brief narrative:  Steve Jensen is a 83 y.o. male with past medical history atrial fibrillation on Eliquis , hypertension, chronic heart failure with preserved ejection, OSA on CPAP, obesity, type 2 diabetes melitis, CKD stage IIIa, GERD, hepatocellular carcinoma, history of prostate cancer, falls, presented to the hospital from skilled nursing facility with complaints of shortness of breath.  Patient was noted to be hypoxic with pulse ox of 88% on room air on arrival.  He was also noted to have A-fib with RVR and received IV fluids.  Of note patient was last hospitalized in April 2025 for syncope and concerns for bradycardia.  At that time Cardizem  was discontinued and a Holter monitor was placed.  Patient was subsequently seen on 27th May 2025 by cardiology and was continued on metoprolol  with Lasix  and Zetia .  Patient did not complete his Holter monitor.  On this presentation, at the  skilled nursing facility patient was noted to be very weak and disoriented and blood pressure was low.  Subsequently, had a fall.  In the ED, blood pressure was better.  Labs showed creatinine of 2.3 with lactate elevation at 3.6.  BNP elevated at 411.  Review of previous 2D echocardiogram from 09/04/2023 showed LV ejection fraction of 55 to 60%.  Patient was then considered for admission to the hospital for further evaluation and treatment.   Assessment/Plan: Principal Problem:   AMS (altered mental status) Active Problems:   Severe sepsis with acute organ dysfunction (HCC)   Essential hypertension, benign   PAF (paroxysmal atrial fibrillation) (HCC)   Uncontrolled type 2 diabetes mellitus with hyperglycemia, without long-term current use of insulin  (HCC)   AKI (acute kidney injury) (HCC)   Chronic heart failure with preserved ejection fraction (HFpEF) (HCC)   SOB (shortness of breath)    Persistent atrial fibrillation with RVR (HCC)  Altered mental status. Transient.  Likely secondary to hypotension.  MRI of the brain without any acute findings.  Patient likely at baseline.  Alert awake and Communicative.  Has impaired memory.  Family confirmed that he has been having some cognitive issues.  Hypotension Improved.  None orthostatic on checking.  Seen by cardiology.  Received 1 dose of IV Lasix  40 today.  SIRS without sepsis. No obvious signs of infection.  Has elevated WBC today.  Initially patient had leukocytosis with AKI and lactic acidosis.  Lactate has improved at this time.   Blood cultures negative in 4 days.  Urine culture insignificant.     Chest x-ray without any infiltrate.  MRSA PCR negative.  Antibiotics have been discontinued.  Will continue to watch.  Diarrhea.  Unable to get sample.  Diarrhea has slowed down.    Hypokalemia.  Potassium of 3.5 today.  Will continue to replenish.  Check levels in AM.  AKI: Baseline creatinine around 1.3-1.4.  Creatinine on presentation at 2.3.  Received some IV fluids.  Creatinine today at 1.6 and trending down..  Will continue to monitor closely.  Intake and output charting.  Daily weights.   Falls/ Acute compression fracture of the L4 : CT head without any acute findings.  Patient complained of lower back pain show CT of the lumbar spine showed acute compression of the L4 fracture with 3 mm retropulsion.  Neurosurgery on-call was consulted by ED who  recommended LSO brace and outpatient follow-up. PT OT recommends skilled nursing facility placement.  Cardiology recommends heart monitor at discharge.   Anemia.   Hemoglobin of 12.2 on presentation.  Will continue to monitor CBC.  Hemoglobin today at 13.0.    History of chronic heart failure with preserved ejection fraction. 2D echocardiogram with LV EF 55-60 %..  Was on Lasix  and metoprolol  at home.  Received 1 dose of IV Lasix  today.  Latest blood pressure of 120/88.  Continue  intake and output charting Daily weights.  On supplemental oxygen  at this time.  Continue metoprolol .    Diabetes mellitus type II. Continue sliding scale insulin .  Closely monitor blood glucose levels.  Latest POC glucose of 197.    Paroxysmal atrial fibrillation with RVR  Improved.  Continue Eliquis  and metoprolol  succinate and short acting metoprolol  at this time.  Cardiology following.  Lactic acidosis: Differential diagnosis include metformin  related, hypotension related.  Received gentle IV fluids.  Has improved at this time.  Class III obesity.  Would benefit from lifestyle modification and weight loss.  Body mass index is 41.22 kg/m.   DVT prophylaxis: apixaban  (ELIQUIS ) tablet 2.5 mg Start: 10/24/23 2200 apixaban  (ELIQUIS ) tablet 2.5 mg   Disposition: Skilled nursing facility in 1 to 2 days  Status is: Inpatient Remains inpatient appropriate because:  pending clinical improvement, skilled nursing facility resident.   Code Status:     Code Status: Limited: Do not attempt resuscitation (DNR) -DNR-LIMITED -Do Not Intubate/DNI   Family Communication: spoke with the patient's daughter on the phone on 10/27/2023  Consultants: Curbside neurosurgery Cardiology  Procedures: None  Anti-infectives:  Vancomycin and Rocephin  IV-discontinued  Subjective:  Today, patient was seen and examined at bedside.   Patient is poor historian.  Denies any chest pain, nausea vomiting fever chills or rigor.  Episodes of tachycardia still present.    Objective:  Vitals:   10/28/23 0718 10/28/23 1156  BP: 123/87 120/88  Pulse: (!) 102 (!) 101  Resp: (!) 23 18  Temp: 98.6 F (37 C) 98.2 F (36.8 C)  SpO2: 92% 91%    Intake/Output Summary (Last 24 hours) at 10/28/2023 1540 Last data filed at 10/28/2023 1224 Gross per 24 hour  Intake --  Output 550 ml  Net -550 ml   Filed Weights   10/26/23 0500 10/27/23 0425 10/28/23 0348  Weight: (!) 137.4 kg 126.2 kg 126.6 kg   Body mass index  is 41.22 kg/m.   Physical Exam:  GENERAL: Patient is alert awake and oriented to place.  Poor historian.  Not in obvious distress.  Obese built, on nasal cannula oxygen  at 2 L/min., elderly male HENT: No scleral pallor or icterus. Pupils equally reactive to light. Oral mucosa is moist NECK: is supple, no gross swelling noted. CHEST:   Diminished breath sounds bilaterally. CVS: S1 and S2 heard, no murmur.  Irregular rhythm. ABDOMEN: Soft, non-tender, bowel sounds are present.  Lower back tenderness on palpation EXTREMITIES: No edema. CNS: Cranial nerves are intact.  Moves extremities. SKIN: warm and dry without rashes.  Data Review: I have personally reviewed the following laboratory data and studies,  CBC: Recent Labs  Lab 10/24/23 1123 10/26/23 0541 10/27/23 0531 10/28/23 0352  WBC 13.2* 11.4* 13.5* 17.7*  NEUTROABS 9.5*  --   --   --   HGB 12.2* 12.5* 12.7* 13.0  HCT 38.5* 38.9* 39.2 40.4  MCV 84.6 83.1 83.1 82.8  PLT 291 282 309 364   Basic Metabolic Panel: Recent Labs  Lab 10/24/23 1123 10/25/23 0447 10/26/23 0541 10/27/23 0531 10/28/23 0352  NA  143 142 139 139 139  K 4.0 3.5 3.7 3.2* 3.5  CL 105 107 104 107 106  CO2 22 22 26 22  21*  GLUCOSE 163* 99 122* 131* 164*  BUN 26* 22 22 22  26*  CREATININE 2.39* 2.00* 1.82* 1.74* 1.66*  CALCIUM  9.1 8.6* 8.6* 8.6* 9.0  MG 1.8  --  1.9 1.9  --    Liver Function Tests: No results for input(s): "AST", "ALT", "ALKPHOS", "BILITOT", "PROT", "ALBUMIN " in the last 168 hours. No results for input(s): "LIPASE", "AMYLASE" in the last 168 hours. No results for input(s): "AMMONIA" in the last 168 hours. Cardiac Enzymes: No results for input(s): "CKTOTAL", "CKMB", "CKMBINDEX", "TROPONINI" in the last 168 hours. BNP (last 3 results) Recent Labs    11/11/22 1210 09/03/23 2103 10/24/23 1123  BNP 457.0* 152.3* 411.2*    ProBNP (last 3 results) No results for input(s): "PROBNP" in the last 8760 hours.  CBG: Recent Labs   Lab 10/27/23 1136 10/27/23 1613 10/27/23 2131 10/28/23 0746 10/28/23 1218  GLUCAP 183* 180* 197* 165* 225*   Recent Results (from the past 240 hours)  Culture, blood (Routine X 2) w Reflex to ID Panel     Status: None (Preliminary result)   Collection Time: 10/24/23  2:51 PM   Specimen: BLOOD  Result Value Ref Range Status   Specimen Description BLOOD RIGHT ANTECUBITAL  Final   Special Requests   Final    BOTTLES DRAWN AEROBIC AND ANAEROBIC Blood Culture adequate volume   Culture   Final    NO GROWTH 4 DAYS Performed at [facility], [address], [place], [place] [address]    Report Status PENDING  Incomplete  Culture, blood (Routine X 2) w Reflex to ID Panel     Status: None (Preliminary result)   Collection Time: 10/24/23  2:56 PM   Specimen: BLOOD  Result Value Ref Range Status   Specimen Description BLOOD LEFT ANTECUBITAL  Final   Special Requests   Final    BOTTLES DRAWN AEROBIC AND ANAEROBIC Blood Culture results may not be optimal due to an inadequate volume of blood received in culture bottles   Culture   Final    NO GROWTH 4 DAYS Performed at [facility], [address], [place], [place] [address]    Report Status PENDING  Incomplete  Urine Culture (for pregnant, neutropenic or urologic patients or patients with an indwelling urinary catheter)     Status: Abnormal   Collection Time: 10/24/23 10:23 PM   Specimen: Urine, Clean Catch  Result Value Ref Range Status   Specimen Description URINE, CLEAN CATCH  Final   Special Requests NONE  Final   Culture (A)  Final    <10,000 COLONIES/mL INSIGNIFICANT GROWTH Performed at [facility], [address] [place], [place] [address]    Report Status 10/25/2023 FINAL  Final  MRSA Next Gen by PCR, Nasal     Status: None   Collection Time: 10/24/23 10:23 PM   Specimen: Urine, Clean Catch; Nasal Swab  Result Value Ref Range Status   MRSA by PCR Next Gen NOT DETECTED NOT DETECTED Final    Comment:  (NOTE) The GeneXpert MRSA Assay (FDA approved for NASAL specimens only), is one component of a comprehensive MRSA colonization surveillance program. It is not intended to diagnose MRSA infection nor to guide or monitor treatment for MRSA infections. Test performance is not FDA approved in patients less than 67 years old. Performed at [facility], [address] [place], [place] [address]      Studies: No results found.     Chidera Thivierge, MD  [facility] 10/28/2023  If 7PM-7AM, please contact night-coverage

## 2023-10-28 NOTE — Progress Notes (Signed)
 Rounding Note    Patient Name: Steve Jensen Date of Encounter: 10/28/2023  [facility] Cardiologist: Sheryle Donning, MD   Subjective   No acute events overnight. He is calm and answers superficial/direct questions appropriately but cannot answer multi-step/complex questions. Denies pain.   Inpatient Medications    Scheduled Meds:  apixaban   2.5 mg Oral BID   feeding supplement  237 mL Oral BID BM   FLUoxetine   10 mg Oral QPM   insulin  aspart  0-15 Units Subcutaneous TID WC   metoprolol  succinate  100 mg Oral Daily   metoprolol  tartrate  12.5 mg Oral Q6H   sodium chloride  flush  3 mL Intravenous Q12H   Continuous Infusions:  PRN Meds: acetaminophen  **OR** acetaminophen , zolpidem   Vital Signs    Vitals:   10/27/23 1958 10/27/23 2332 10/28/23 0348 10/28/23 0718  BP:  138/80 (!) 139/92 123/87  Pulse: (!) 142  (!) 117 (!) 102  Resp: (!) 24   (!) 23  Temp:  99.1 F (37.3 C) 99.1 F (37.3 C) 98.6 F (37 C)  TempSrc:  Oral Oral Oral  SpO2: 94%  94% 92%  Weight:   126.6 kg   Height:        Intake/Output Summary (Last 24 hours) at 10/28/2023 1008 Last data filed at 10/27/2023 2334 Gross per 24 hour  Intake --  Output 1000 ml  Net -1000 ml      10/28/2023    3:48 AM 10/27/2023    4:25 AM 10/26/2023    5:00 AM  Last 3 Weights  Weight (lbs) 279 lb 1.6 oz 278 lb 3.5 oz 302 lb 14.6 oz  Weight (kg) 126.6 kg 126.2 kg 137.4 kg      Telemetry    Atrial fibrillation, rates 90s-110s - Personally Reviewed  Physical Exam   GEN: No acute distress, though mildly tachypneic on conversation Neck: JVD upper neck at 45 degrees Cardiac: tachycardic, irregularly irregular  Respiratory: coarse at bilateral bases GI: Soft, nontender, mildly distended, +BS MS: trivial LE edema; No deformity. Neuro:  Nonfocal  Psych: Normal affect   New pertinent results (labs, ECG, imaging, cardiac studies)    Labs as noted below  Assessment & Plan    Hypotension Acute  kidney injury -lasix  on hold this admission, but now volume going up. BP has improved as has Cr. Will need to balance volume status with blood pressure -his white count is rising, no focal infectious symptoms, did present with hypotension and lactic acidosis. Defer infectious workup to primary team  Chronic diastolic heart failure: more volume, up, more short of breath today. Will give IV lasix  x1 today and then restart oral tomorrow AM. Was on BID dosing at home, will start with daily dosing.   Hypokalemia: K 3.5, with diuresis plans will give 40 mEq today  Longstanding persistent vs. Permanent afib:  -recent admission in 08/2023 concerning for bradycardia/pauses, so his heart rate has been allowed to be mildly permissive from a tachycardia standpoint.  -continue apixaban  for chadsvasc=5 (was on 5 mg BID prior to admission, decreased to 2.5 mg BID given current renal function, likely will need to return to prior dosing once renal function normalizes)   Falls, spinal compression fracture, waxing/waning mental status: unclear trigger for this  See my initial consult note; difficult situation, clinically has had signs/symptoms of diastolic heart failure but presentation was concerning for volume depletion. While blood pressure and renal function have improved with holding diuresis, he is now more  volume overloaded. Difficult balance.     Signed, Sheryle Donning, MD  10/28/2023, 10:08 AM

## 2023-10-28 NOTE — Progress Notes (Signed)
   10/28/23 2015  Assess: MEWS Score  Temp 98.8 F (37.1 C)  BP (!) 141/71  MAP (mmHg) 96  Pulse Rate (!) 134  ECG Heart Rate (!) 144  Resp (!) 21  Level of Consciousness Alert  SpO2 95 %  O2 Device Nasal Cannula  O2 Flow Rate (L/min) 3 L/min  Assess: MEWS Score  MEWS Temp 0  MEWS Systolic 0  MEWS Pulse 3  MEWS RR 1  MEWS LOC 0  MEWS Score 4  MEWS Score Color Red  Assess: if the MEWS score is Yellow or Red  Were vital signs accurate and taken at a resting state? Yes  Does the patient meet 2 or more of the SIRS criteria? Yes  Does the patient have a confirmed or suspected source of infection? No  MEWS guidelines implemented  Yes, red  Treat  MEWS Interventions Considered administering scheduled or prn medications/treatments as ordered  Take Vital Signs  Increase Vital Sign Frequency  Red: Q1hr x2, continue Q4hrs until patient remains green for 12hrs  Escalate  MEWS: Escalate Red: Discuss with charge nurse and notify provider. Consider notifying RRT. If remains red for 2 hours consider need for higher level of care  Notify: Charge Nurse/RN  Name of Charge Nurse/RN Administrator, arts, RN  Assess: SIRS CRITERIA  SIRS Temperature  0  SIRS Respirations  1  SIRS Pulse 1  SIRS WBC 0  SIRS Score Sum  2

## 2023-10-28 NOTE — Plan of Care (Signed)
   Problem: Coping: Goal: Ability to adjust to condition or change in health will improve Outcome: Progressing   Problem: Fluid Volume: Goal: Ability to maintain a balanced intake and output will improve Outcome: Progressing   Problem: Metabolic: Goal: Ability to maintain appropriate glucose levels will improve Outcome: Progressing   Problem: Nutritional: Goal: Maintenance of adequate nutrition will improve Outcome: Progressing

## 2023-10-29 DIAGNOSIS — R4182 Altered mental status, unspecified: Secondary | ICD-10-CM

## 2023-10-29 DIAGNOSIS — N179 Acute kidney failure, unspecified: Secondary | ICD-10-CM | POA: Diagnosis not present

## 2023-10-29 DIAGNOSIS — I5032 Chronic diastolic (congestive) heart failure: Secondary | ICD-10-CM | POA: Diagnosis not present

## 2023-10-29 DIAGNOSIS — R404 Transient alteration of awareness: Secondary | ICD-10-CM | POA: Diagnosis not present

## 2023-10-29 DIAGNOSIS — E876 Hypokalemia: Secondary | ICD-10-CM | POA: Diagnosis not present

## 2023-10-29 LAB — BASIC METABOLIC PANEL WITH GFR
Anion gap: 9 (ref 5–15)
BUN: 31 mg/dL — ABNORMAL HIGH (ref 8–23)
CO2: 23 mmol/L (ref 22–32)
Calcium: 9.2 mg/dL (ref 8.9–10.3)
Chloride: 109 mmol/L (ref 98–111)
Creatinine, Ser: 1.81 mg/dL — ABNORMAL HIGH (ref 0.61–1.24)
GFR, Estimated: 37 mL/min — ABNORMAL LOW (ref >60)
Glucose, Bld: 157 mg/dL — ABNORMAL HIGH (ref 70–99)
Potassium: 3.7 mmol/L (ref 3.5–5.1)
Sodium: 141 mmol/L (ref 135–145)

## 2023-10-29 LAB — CULTURE, BLOOD (ROUTINE X 2)
Culture: NO GROWTH
Culture: NO GROWTH
Special Requests: ADEQUATE

## 2023-10-29 LAB — GLUCOSE, CAPILLARY
Glucose-Capillary: 160 mg/dL — ABNORMAL HIGH (ref 70–99)
Glucose-Capillary: 201 mg/dL — ABNORMAL HIGH (ref 70–99)
Glucose-Capillary: 188 mg/dL — ABNORMAL HIGH (ref 70–99)
Glucose-Capillary: 144 mg/dL — ABNORMAL HIGH (ref 70–99)

## 2023-10-29 LAB — CBC
HCT: 40.3 % (ref 39.0–52.0)
Hemoglobin: 13.2 g/dL (ref 13.0–17.0)
MCH: 27.6 pg (ref 26.0–34.0)
MCHC: 32.8 g/dL (ref 30.0–36.0)
MCV: 84.1 fL (ref 80.0–100.0)
Platelets: 364 10*3/uL (ref 150–400)
RBC: 4.79 MIL/uL (ref 4.22–5.81)
RDW: 19.6 % — ABNORMAL HIGH (ref 11.5–15.5)
WBC: 20 10*3/uL — ABNORMAL HIGH (ref 4.0–10.5)
nRBC: 0 % (ref 0.0–0.2)

## 2023-10-29 LAB — MAGNESIUM: Magnesium: 1.9 mg/dL (ref 1.7–2.4)

## 2023-10-29 MED ORDER — METOPROLOL TARTRATE 25 MG PO TABS
25.0000 mg | ORAL_TABLET | Freq: Four times a day (QID) | ORAL | Status: DC
  Administered 2023-10-29 – 2023-10-30 (×3): 25 mg via ORAL
  Filled 2023-10-29 (×4): qty 1

## 2023-10-29 NOTE — TOC Progression Note (Signed)
 Transition of Care [facility]) - Progression Note    Patient Details  Name: Steve Jensen MRN: [id number] Date of Birth: [birth year]  Transition of [facility]) CM/SW Contact  Carmon Christen, LCSWA Phone Number: 10/29/2023, 10:17 AM  Clinical Narrative:     Patients insurance authorization has been approved. Plan auth ID# [id number] Auth ID# [id number]. Insurance authorization has been approved from 6/4-6/6. Patient has SNF bed at [facility]. CSW informed MD. CSW will continue to follow and assist with patients dc planning needs.  Expected Discharge Plan: Skilled Nursing Facility Barriers to Discharge: Continued Medical Work up, English as a second language teacher  Expected Discharge Plan and Services       Living arrangements for the past 2 months: Assisted Living Facility                                       Social Determinants of Health (SDOH) Interventions SDOH Screenings   Food Insecurity: No Food Insecurity (10/25/2023)  Housing: Low Risk  (10/25/2023)  Transportation Needs: No Transportation Needs (10/25/2023)  Utilities: Not At Risk (10/25/2023)  Social Connections: Socially Isolated (10/27/2023)  Tobacco Use: Medium Risk (10/24/2023)    Readmission Risk Interventions    12/26/2021   12:51 PM 12/25/2021    3:31 PM 12/19/2021   12:39 PM  Readmission Risk Prevention Plan  Transportation Screening  Complete Complete  PCP or Specialist Appt within 5-7 Days Complete    Home Care Screening  Complete   Medication Review (RN CM)  Complete   HRI or Home Care Consult   Complete  Social Work Consult for Recovery Care Planning/Counseling   Complete  Palliative Care Screening   Not Applicable  Medication Review Oceanographer)   Complete

## 2023-10-29 NOTE — Progress Notes (Addendum)
 Physical Therapy Treatment Patient Details Name: Steve Jensen MRN: [id number] DOB: [birth year] Today's Date: 10/29/2023   History of Present Illness 83  y.o. male [facility] and found to be in a-fib RVR. Pt also with fall last Wednesday, head CT negative and MRI pending. AMS w/ suspected sepsis. Lumbar spine CT showed acute compression of L4 fx w/ 3 mm retropulsion. Prior admit 4/10 due to fall. PMH: a-fib, HTN, chronic HF-EF, OSA on CPAP, DM II, CKD III, GERD, hepatocellular carcinoma, prostate cancer.    PT Comments  Pt with noted change in affect and participation today. Pt does not answer questions even at the yes/no level. Pt does report increased pain in his back. RN in room and provided Tylenol  during session. In an attempt to improve comfort, elevated HoB and had pt pull up into long sitting to place pillow in the small of his back. Pt is able to initiate but ultimately requires maxAx2 to lift back from elevated bed surface. D/c recommendations remain appropriate. PT will continue to follow acutely.     If plan is discharge home, recommend the following: Help with stairs or ramp for entrance;Assist for transportation;Assistance with cooking/housework;Two people to help with walking and/or transfers;A lot of help with bathing/dressing/bathroom   Can travel by private vehicle     No  Equipment Recommendations  None recommended by PT    Recommendations for Other Services       Precautions / Restrictions Precautions Precautions: Back;Fall Precaution Booklet Issued: No Recall of Precautions/Restrictions: Impaired Precaution/Restrictions Comments: watch HR Required Braces or Orthoses: Spinal Brace Spinal Brace: Lumbar corset Restrictions Weight Bearing Restrictions Per Provider Order: No     Mobility  Bed Mobility Overal bed mobility: Needs Assistance       Supine to sit: [place] assist, HOB elevated, Used rails, +2 for  physical assistance     General bed mobility comments: had pt utilize UE on B bedrails to pull himself into longsitting to place pillow at lumbar spine to provide mores support in attempt to lessen pain    Transfers                   General transfer comment: deferred due to slowed response to commands and increased effort required    Ambulation/Gait               General Gait Details: unable to progress due to fatigue after using BSC          Balance Overall balance assessment: History of Falls Sitting-balance support: Bilateral upper extremity supported Sitting balance-Leahy Scale: Zero Sitting balance - Comments: with maxX2 Postural control: Posterior lean                                  Communication Communication Communication: Impaired Factors Affecting Communication: Difficulty expressing self  Cognition Arousal: Lethargic Behavior During Therapy: Flat affect   PT - Cognitive impairments: Orientation, Awareness, Memory, Attention, Initiation, Sequencing, Problem solving, Safety/Judgement   Orientation impairments: Person, Situation                   PT - Cognition Comments: pt completely different from session earlier in the week, pt with decreased conversation, follows commands but does not answer questions Following commands: Impaired Following commands impaired: Follows one step commands inconsistently, Follows one step commands with increased time    Cueing Cueing  Techniques: Verbal cues, Tactile cues, Visual cues, Gestural cues     General Comments General comments (skin integrity, edema, etc.): VSS on 2L O2 via [place], RN in room also notes change in participation from earlier in the week      Pertinent Vitals/Pain Pain Assessment Pain Assessment: Faces Faces Pain Scale: Hurts little more Breathing: normal Negative Vocalization: occasional moan/groan, low speech, negative/disapproving quality Facial Expression: sad,  frightened, frown Body Language: tense, distressed pacing, fidgeting Consolability: no need to console PAINAD Score: 3 Pain Location: low back Pain Descriptors / Indicators: Aching, Discomfort Pain Intervention(s): Limited activity within patient's tolerance, Monitored during session, Repositioned, Patient requesting pain meds-RN notified, RN gave pain meds during session     PT Goals (current goals can now be found in the care plan section) Acute Rehab PT Goals Patient Stated Goal: pt unable to state goal PT Goal Formulation: Patient unable to participate in goal setting Time For Goal Achievement: 11/08/23 Potential to Achieve Goals: Fair Progress towards PT goals: Not progressing toward goals - comment    Frequency    Min 2X/week       AM-PAC PT "6 Clicks" Mobility   Outcome Measure  Help needed turning from your back to your side while in a flat bed without using bedrails?: Total Help needed moving from lying on your back to sitting on the side of a flat bed without using bedrails?: Total Help needed moving to and from a bed to a chair (including a wheelchair)?: Total Help needed standing up from a chair using your arms (e.g., wheelchair or bedside chair)?: Total Help needed to walk in hospital room?: Total Help needed climbing 3-5 steps with a railing? : Total 6 Click Score: 6    End of Session   Activity Tolerance: Other (comment) (cognition) Patient left: in bed;with call bell/phone within reach;with bed alarm set Nurse Communication: Mobility status PT Visit Diagnosis: Unsteadiness on feet (R26.81);Other abnormalities of gait and mobility (R26.89);Repeated falls (R29.6);Muscle weakness (generalized) (M62.81)     Time: 9604-5409 PT Time Calculation (min) (ACUTE ONLY): 14 min  Charges:    $Therapeutic Activity: 8-22 mins PT General Charges $$ ACUTE PT VISIT: 1 Visit                     Steve Jensen B. Steve Jensen PT, DPT Acute Rehabilitation Services Please  use secure chat or  Call Office [phone]    Steve Jensen Steve Jensen 10/29/2023, 5:02 PM

## 2023-10-29 NOTE — Progress Notes (Signed)
 PROGRESS NOTE  Steve Jensen GEX:[id number] DOB: 09-Apr-1941 DOA: 10/24/2023 PCP: Glena Landau, MD   LOS: 5 days   Brief narrative:  Steve Jensen is a 83 y.o. male with past medical history atrial fibrillation on Eliquis , hypertension, chronic heart failure with preserved ejection, OSA on CPAP, obesity, type 2 diabetes melitis, CKD stage IIIa, GERD, hepatocellular carcinoma, history of prostate cancer, falls, presented to the hospital from skilled nursing facility with complaints of shortness of breath.  Patient was noted to be hypoxic with pulse ox of 88% on room air on arrival.  He was also noted to have A-fib with RVR and received IV fluids.  Of note patient was last hospitalized in April 2025 for syncope and concerns for bradycardia.  At that time Cardizem  was discontinued and a Holter monitor was placed.  Patient was subsequently seen on 27th May 2025 by cardiology and was continued on metoprolol  with Lasix  and Zetia .  Patient did not complete his Holter monitor.  On this presentation, at the  skilled nursing facility patient was noted to be very weak and disoriented and blood pressure was low.  Subsequently, had a fall.  In the ED, blood pressure was better.  Labs showed creatinine of 2.3 with lactate elevation at 3.6.  BNP elevated at 411.  Review of previous 2D echocardiogram from 09/04/2023 showed LV ejection fraction of 55 to 60%.  Patient was then considered for admission to the hospital for further evaluation and treatment.   Assessment/Plan: Principal Problem:   AMS (altered mental status) Active Problems:   Severe sepsis with acute organ dysfunction (HCC)   Essential hypertension, benign   PAF (paroxysmal atrial fibrillation) (HCC)   Uncontrolled type 2 diabetes mellitus with hyperglycemia, without long-term current use of insulin  (HCC)   AKI (acute kidney injury) (HCC)   Chronic heart failure with preserved ejection fraction (HFpEF) (HCC)   SOB (shortness of breath)    Persistent atrial fibrillation with RVR (HCC)  Altered mental status. Transient.  Likely secondary to hypotension.  MRI of the brain without any acute findings.  Patient likely at baseline.  Alert awake and Communicative.  Has impaired memory.  Family confirmed that he has been having some cognitive issues.  Has impaired memory.  Hypotension Improved.  None orthostatic on checking.  Seen by cardiology.  Received 1 dose of IV Lasix  40 40 mg yesterday and has been started on 40 mg oral daily.  Will continue intake and output charting Daily weights.  Leukocytosis. No obvious signs of infection.  Has elevated WBC today and trending up..  Initially patient had leukocytosis with AKI and lactic acidosis.  Lactate has improved at this time.   Blood cultures negative in 4 days.  Urine culture insignificant.     Chest x-ray without any infiltrate.  MRSA PCR negative.  Was initially antibiotic Rocephin  discontinued.  He did have brief diarrhea but no further diarrhea reported.  Uncertain etiology of leukocytosis.  Will get differentials in AM.  Afebrile at this time.  Hypokalemia.  Improved.  Latest potassium of 3.7.  AKI: Baseline creatinine around 1.3-1.4.  Creatinine on presentation at 2.3.  Current today at 1.8.  He received IV Lasix  and has been started on oral Lasix  from today.  Will continue to monitor closely.  Intake and output charting.  Daily weights.   Falls/ Acute compression fracture of the L4 : CT head without any acute findings.  Patient complained of lower back pain show CT of the lumbar spine showed acute  compression of the L4 fracture with 3 mm retropulsion.  Neurosurgery on-call was consulted by ED who  recommended LSO brace and outpatient follow-up. PT OT recommends skilled nursing facility placement.  Cardiology recommends heart monitor at discharge.   Anemia.   Hemoglobin of 12.2 on presentation.  Will continue to monitor CBC.  Hemoglobin today at 13.2.    History of chronic heart  failure with preserved ejection fraction. 2D echocardiogram with LV EF 55-60 %..  Was on Lasix  and metoprolol  at home.  Received 1 dose of IV Lasix  here today and has been started on oral Lasix  40 mg daily.  Latest blood pressure of 120/88.  Continue intake and output charting Daily weights.  On supplemental oxygen  at this time at 2 L/min..  Continue metoprolol .    Diabetes mellitus type II. Continue sliding scale insulin .  Closely monitor blood glucose levels.  Latest POC glucose of 197.    Paroxysmal atrial fibrillation with RVR  Continue Eliquis  and metoprolol  succinate and short acting metoprolol  at this time.  Cardiology following.  Heart rate has slightly improved at this time  Lactic acidosis: Resolved.  Class III obesity.  Would benefit from lifestyle modification and weight loss.  Body mass index is 40.57 kg/m.   DVT prophylaxis: apixaban  (ELIQUIS ) tablet 2.5 mg Start: 10/24/23 2200 apixaban  (ELIQUIS ) tablet 2.5 mg   Disposition: Skilled nursing facility in 1 to 2 days  Status is: Inpatient Remains inpatient appropriate because:  pending clinical improvement, skilled nursing facility resident, with RVR, leukocytosis   Code Status:     Code Status: Limited: Do not attempt resuscitation (DNR) -DNR-LIMITED -Do Not Intubate/DNI   Family Communication:  spoke with the patient's daughter on the phone on 10/27/2023  Consultants: Curbside neurosurgery Cardiology  Procedures: None  Anti-infectives:  Vancomycin and Rocephin  IV-discontinued  Subjective:  Today, patient was seen and examined at bedside.   Patient is poor historian but denies any pain, nausea, vomiting, fever, chills or rigor..  Objective:  Vitals:   10/29/23 0805 10/29/23 0819  BP:    Pulse:    Resp: 16   Temp: (!) 97.5 F (36.4 C)   SpO2: 91% 94%    Intake/Output Summary (Last 24 hours) at 10/29/2023 1000 Last data filed at 10/29/2023 0802 Gross per 24 hour  Intake 363 ml  Output 1500 ml  Net  -1137 ml   Filed Weights   10/27/23 0425 10/28/23 0348 10/29/23 0500  Weight: 126.2 kg 126.6 kg 124.6 kg   Body mass index is 40.57 kg/m.   Physical Exam:  General: Obese built, not in obvious distress protuberant, on nasal cannula oxygen , elderly male HENT:   No scleral pallor or icterus noted. Oral mucosa is moist.  Chest:   Diminished breath sounds bilaterally CVS: S1 &S2 heard. No murmur.  Irregular rhythm with tachycardia. Abdomen: Soft, nontender, nondistended.  Bowel sounds are heard.   Extremities: No cyanosis, clubbing or edema.  Peripheral pulses are palpable. Psych: Alert, awake and Communicative. CNS:  No cranial nerve deficits.  Moves extremities Skin: Warm and dry.  No rashes noted.   Data Review: I have personally reviewed the following laboratory data and studies,  CBC: Recent Labs  Lab 10/24/23 1123 10/26/23 0541 10/27/23 0531 10/28/23 0352 10/29/23 0400  WBC 13.2* 11.4* 13.5* 17.7* 20.0*  NEUTROABS 9.5*  --   --   --   --   HGB 12.2* 12.5* 12.7* 13.0 13.2  HCT 38.5* 38.9* 39.2 40.4 40.3  MCV 84.6 83.1 83.1 82.8  84.1  PLT 291 282 309 364 364   Basic Metabolic Panel: Recent Labs  Lab 10/24/23 1123 10/25/23 0447 10/26/23 0541 10/27/23 0531 10/28/23 0352 10/29/23 0400  NA 143 142 139 139 139 141  K 4.0 3.5 3.7 3.2* 3.5 3.7  CL 105 107 104 107 106 109  CO2 22 22 26 22  21* 23  GLUCOSE 163* 99 122* 131* 164* 157*  BUN 26* 22 22 22  26* 31*  CREATININE 2.39* 2.00* 1.82* 1.74* 1.66* 1.81*  CALCIUM  9.1 8.6* 8.6* 8.6* 9.0 9.2  MG 1.8  --  1.9 1.9  --  1.9   Liver Function Tests: No results for input(s): "AST", "ALT", "ALKPHOS", "BILITOT", "PROT", "ALBUMIN " in the last 168 hours. No results for input(s): "LIPASE", "AMYLASE" in the last 168 hours. No results for input(s): "AMMONIA" in the last 168 hours. Cardiac Enzymes: No results for input(s): "CKTOTAL", "CKMB", "CKMBINDEX", "TROPONINI" in the last 168 hours. BNP (last 3 results) Recent Labs     11/11/22 1210 09/03/23 2103 10/24/23 1123  BNP 457.0* 152.3* 411.2*    ProBNP (last 3 results) No results for input(s): "PROBNP" in the last 8760 hours.  CBG: Recent Labs  Lab 10/28/23 0746 10/28/23 1218 10/28/23 1631 10/28/23 2038 10/29/23 0801  GLUCAP 165* 225* 177* 247* 160*   Recent Results (from the past 240 hours)  Culture, blood (Routine X 2) w Reflex to ID Panel     Status: None (Preliminary result)   Collection Time: 10/24/23  2:51 PM   Specimen: BLOOD  Result Value Ref Range Status   Specimen Description BLOOD RIGHT ANTECUBITAL  Final   Special Requests   Final    BOTTLES DRAWN AEROBIC AND ANAEROBIC Blood Culture adequate volume   Culture   Final    NO GROWTH 4 DAYS Performed at [facility], [address], [place], [place] [address]    Report Status PENDING  Incomplete  Culture, blood (Routine X 2) w Reflex to ID Panel     Status: None (Preliminary result)   Collection Time: 10/24/23  2:56 PM   Specimen: BLOOD  Result Value Ref Range Status   Specimen Description BLOOD LEFT ANTECUBITAL  Final   Special Requests   Final    BOTTLES DRAWN AEROBIC AND ANAEROBIC Blood Culture results may not be optimal due to an inadequate volume of blood received in culture bottles   Culture   Final    NO GROWTH 4 DAYS Performed at [facility], [address], [place], [place] [address]    Report Status PENDING  Incomplete  Urine Culture (for pregnant, neutropenic or urologic patients or patients with an indwelling urinary catheter)     Status: Abnormal   Collection Time: 10/24/23 10:23 PM   Specimen: Urine, Clean Catch  Result Value Ref Range Status   Specimen Description URINE, CLEAN CATCH  Final   Special Requests NONE  Final   Culture (A)  Final    <10,000 COLONIES/mL INSIGNIFICANT GROWTH Performed at [facility], [address], [place], [place] [address]    Report Status 10/25/2023 FINAL  Final  MRSA Next Gen by PCR, Nasal     Status: None    Collection Time: 10/24/23 10:23 PM   Specimen: Urine, Clean Catch; Nasal Swab  Result Value Ref Range Status   MRSA by PCR Next Gen NOT DETECTED NOT DETECTED Final    Comment: (NOTE) The GeneXpert MRSA Assay (FDA approved for NASAL specimens only), is one component  of a comprehensive MRSA colonization surveillance program. It is not intended to diagnose MRSA infection nor to guide or monitor treatment for MRSA infections. Test performance is not FDA approved in patients less than 30 years old. Performed at [facility], [address] [place], [place] [address]      Studies: No results found.    Rishi Vicario, MD  [facility] 10/29/2023  If 7PM-7AM, please contact night-coverage

## 2023-10-29 NOTE — Care Management Important Message (Signed)
 Important Message  Patient Details  Name: Steve Jensen MRN: [id number] Date of Birth: 1940-10-02   Important Message Given:  Yes - Medicare IM     Janith Melnick 10/29/2023, 11:16 AM

## 2023-10-29 NOTE — Plan of Care (Signed)
  Problem: Clinical Measurements: Goal: Respiratory complications will improve Outcome: Progressing Goal: Cardiovascular complication will be avoided Outcome: Progressing   Problem: Nutrition: Goal: Adequate nutrition will be maintained Outcome: Progressing   Problem: Elimination: Goal: Will not experience complications related to bowel motility Outcome: Progressing Goal: Will not experience complications related to urinary retention Outcome: Progressing   Problem: Pain Managment: Goal: General experience of comfort will improve and/or be controlled Outcome: Progressing   Problem: Safety: Goal: Ability to remain free from injury will improve Outcome: Progressing

## 2023-10-29 NOTE — Progress Notes (Signed)
 Rounding Note   Patient Name: Steve Jensen Date of Encounter: 10/29/2023  [facility] Cardiologist: Sheryle Donning, MD   Subjective  Pt cooperative and answers questions, although his answers are quite delayed after asking the question. He has no complaints, unclear how reliable his report is.  Scheduled Meds:  apixaban   2.5 mg Oral BID   feeding supplement  237 mL Oral BID BM   FLUoxetine   10 mg Oral QPM   furosemide   40 mg Oral Daily   insulin  aspart  0-15 Units Subcutaneous TID WC   metoprolol  succinate  100 mg Oral Daily   metoprolol  tartrate  12.5 mg Oral Q6H   sodium chloride  flush  3 mL Intravenous Q12H   Continuous Infusions:  PRN Meds: acetaminophen  **OR** acetaminophen , mouth rinse, zolpidem   Vital Signs  Vitals:   10/29/23 0500 10/29/23 0759 10/29/23 0805 10/29/23 0819  BP:  126/80    Pulse:      Resp:   16   Temp:   (!) 97.5 F (36.4 C)   TempSrc:  Oral Oral   SpO2:   91% 94%  Weight: 124.6 kg     Height:        Intake/Output Summary (Last 24 hours) at 10/29/2023 0835 Last data filed at 10/29/2023 0802 Gross per 24 hour  Intake 363 ml  Output 1500 ml  Net -1137 ml      10/29/2023    5:00 AM 10/28/2023    3:48 AM 10/27/2023    4:25 AM  Last 3 Weights  Weight (lbs) 274 lb 11.1 oz 279 lb 1.6 oz 278 lb 3.5 oz  Weight (kg) 124.6 kg 126.6 kg 126.2 kg      Telemetry Afib with rates in the 90-120s - Personally Reviewed  Physical Exam  GEN: No acute distress. Neck: No JVD - difficult exam Cardiac: irregular rhythm, regular rate on exam Respiratory: O2 sat 94% with [place] askew GI: Soft, nontender, non-distended  MS: minimal edema, less than 1+ pitting Neuro:  slow to answer questions  Labs High Sensitivity Troponin:   Recent Labs  Lab 10/24/23 1452 10/24/23 2002  TROPONINIHS 9 8     Chemistry Recent Labs  Lab 10/26/23 0541 10/27/23 0531 10/28/23 0352 10/29/23 0400  NA 139 139 139 141  K 3.7 3.2* 3.5 3.7  CL 104 107 106  109  CO2 26 22 21* 23  GLUCOSE 122* 131* 164* 157*  BUN 22 22 26* 31*  CREATININE 1.82* 1.74* 1.66* 1.81*  CALCIUM  8.6* 8.6* 9.0 9.2  MG 1.9 1.9  --  1.9  GFRNONAA 37* 39* 41* 37*  ANIONGAP 9 10 12 9     Lipids No results for input(s): "CHOL", "TRIG", "HDL", "LABVLDL", "LDLCALC", "CHOLHDL" in the last 168 hours.  Hematology Recent Labs  Lab 10/27/23 0531 10/28/23 0352 10/29/23 0400  WBC 13.5* 17.7* 20.0*  RBC 4.72 4.88 4.79  HGB 12.7* 13.0 13.2  HCT 39.2 40.4 40.3  MCV 83.1 82.8 84.1  MCH 26.9 26.6 27.6  MCHC 32.4 32.2 32.8  RDW 19.2* 18.9* 19.6*  PLT 309 364 364   Thyroid  No results for input(s): "TSH", "FREET4" in the last 168 hours.  BNP Recent Labs  Lab 10/24/23 1123  BNP 411.2*    DDimer No results for input(s): "DDIMER" in the last 168 hours.   Radiology  No results found.  Cardiac Studies    Patient Profile   83 y.o. male with a hx of longstanding persistent vs. Permanent atrial  fibrillation, chronic diastolic heart failure, ckd 3a, history of hepatocellular carcinoma and prostate cancer, type II diabetes, and OSA  who is being seen for the evaluation of hypotension.  Assessment & Plan   Longstanding persistent versus permanent atrial fibrillation - RVR on presentation, but recent hospitalization with bradycardia - Notes indicate he was tolerating metoprolol  succinate 100 mg daily - Previously with fall possibly secondary to bradycardia -- on toprol  100 mg daily with additional 12.5 mg lopressor  q6h -- moderately rate controlled, but still bumping into the 120s - may need to increase toprol  to 125 mg daily -- BP adequate today in the 130s on my check   Chronic anticoagulation - appropriately dosed on eliquis  2.5 gm BID - was on 5 mg BID, will need to follow renal function closely   Chronic diastolic heart failure Intermittent hypotension - Was hypoxic on admission requiring O2 -- diuretic held for BP initially -- received 40 mg IV lasix   yesterday -- now scheduled for 40 mg lasix  daily - I agree with this, lungs fairly clear on exam   Leukocytosis -- WBC now 20 -- will defer to primary team for infectious workup -- presented with hypotension and lactic acidosis   AonCKD - sCr 1.81 - received IV lasix  x 1 dose yesterday - agree with continued 40 mg lasix      For questions or updates, please contact [facility] Please consult [email or web address] for contact info under     Signed, Lamond Pilot, PA  10/29/2023, 8:35 AM

## 2023-10-29 NOTE — NC FL2 (Signed)
 [place]  MEDICAID FL2 LEVEL OF CARE FORM     IDENTIFICATION  Patient Name: Steve Jensen Birthdate: 1940/09/21 Sex: male Admission Date (Current Location): 10/24/2023  [facility] and [place] Number:  Producer, television/film/video and Address:  [facility], [address], [place], [place] [address]      Provider Number: [id number]  Attending Physician Name and Address:  Rosena Conradi, MD  Relative Name and Phone Number:  KEYMANI, MCLEAN (Daughter)  [phone]    Current Level of Care: Hospital Recommended Level of Care: Skilled Nursing Facility Prior Approval Number:    Date Approved/Denied:   PASRR Number: [id number]  Discharge Plan: SNF    Current Diagnoses: Patient Active Problem List   Diagnosis Date Noted   Persistent atrial fibrillation with RVR (HCC) 10/27/2023   AMS (altered mental status) 10/24/2023   Metabolic acidosis 10/24/2023   SOB (shortness of breath) 10/24/2023   Syncope 09/04/2023   Cellulitis 09/04/2023   AKI (acute kidney injury) (HCC) 09/04/2023   Hyperkalemia 09/04/2023   Long term (current) use of anticoagulants 09/04/2023   Fall 09/04/2023   Chronic heart failure with preserved ejection fraction (HFpEF) (HCC) 09/04/2023   Acute on chronic diastolic (congestive) heart failure (HCC) 11/11/2022   Weakness of right lower extremity 11/11/2022   Hypokalemia 11/11/2022   Ileus (HCC)    Uncontrolled type 2 diabetes mellitus with hyperglycemia, without long-term current use of insulin  (HCC) 12/17/2021   Osteoarthritis of right knee 12/17/2021   Community acquired pneumonia of left lower lobe of lung 12/16/2021   Severe sepsis with acute organ dysfunction (HCC) 12/16/2021   HCC (hepatocellular carcinoma) (HCC) 02/15/2018   Hepatocellular carcinoma (HCC) 12/30/2017   Pain in joint of left shoulder 11/09/2017   Hypertensive heart disease with heart failure (HCC) 03/23/2017   Chronic diastolic heart failure (HCC) 03/19/2015    PAF (paroxysmal atrial fibrillation) (HCC) 08/11/2013   S/P left knee arthroscopy 07/11/2013   Mixed hyperlipidemia 03/03/2013   Essential hypertension, benign 03/03/2013   Malignant neoplasm of prostate (HCC) 03/03/2013   Osteoarthrosis, unspecified whether generalized or localized, other specified sites 03/03/2013   Allergic rhinitis 03/03/2013   Type 2 diabetes mellitus (HCC) 03/03/2013   Diabetic polyneuropathy (HCC) 03/03/2013   OSA (obstructive sleep apnea) 03/03/2013   Proteinuria 03/03/2013   Morbid obesity (HCC) 03/03/2013   Right knee pain 02/07/2013    Orientation RESPIRATION BLADDER Height & Weight     Self, Place  O2 (Nasal Cannula 2 liters) External catheter, Incontinent (External Urinary Catheter) Weight: 274 lb 11.1 oz (124.6 kg) Height:  5\' 9"  (175.3 cm)  BEHAVIORAL SYMPTOMS/MOOD NEUROLOGICAL BOWEL NUTRITION STATUS      Incontinent Diet (see DC summary)  AMBULATORY STATUS COMMUNICATION OF NEEDS Skin   Extensive Assist Verbally Other (Comment) (Abrasion,arm,leg,Bil.,Ecchymosis,Bil.,Erythema,coccyx,Bil.,Wound/Incision LDAs,Wound/Incision open or dehisced skin tear knee,anterior,L,arm,L,lower,posterior,proximal)                       Personal Care Assistance Level of Assistance  Bathing, Feeding, Dressing Bathing Assistance: Maximum assistance Feeding assistance: Limited assistance Dressing Assistance: Maximum assistance     Functional Limitations Info  Sight, Hearing, Speech Sight Info: Adequate Hearing Info: Impaired Speech Info: Adequate    SPECIAL CARE FACTORS FREQUENCY  PT (By licensed PT), OT (By licensed OT)     PT Frequency: 5x/week OT Frequency: 5x/week            Contractures Contractures Info: Not present    Additional Factors Info  Code Status, Allergies,  Psychotropic, Insulin  Sliding Scale Code Status Info: FULL Allergies Info: Lipitor (Atorvastatin), Zithromax (Azithromycin) , Zocor (Simvastatin),  Norvasc   (Amlodipine ) Psychotropic Info: FLUoxetine  (PROZAC ) capsule 10 mg every evening Insulin  Sliding Scale Info: insulin  aspart (novoLOG ) injection 0-15 Units 3 times daily with meals       Current Medications (10/29/2023):  This is the current hospital active medication list Current Facility-Administered Medications  Medication Dose Route Frequency Provider Last Rate Last Admin   acetaminophen  (TYLENOL ) tablet 650 mg  650 mg Oral Q6H PRN Patel, Ekta V, MD   650 mg at 10/26/23 1752   Or   acetaminophen  (TYLENOL ) suppository 650 mg  650 mg Rectal Q6H PRN Patel, Ekta V, MD       apixaban  (ELIQUIS ) tablet 2.5 mg  2.5 mg Oral BID Patel, Ekta V, MD   2.5 mg at 10/29/23 1016   feeding supplement (ENSURE PLUS HIGH PROTEIN) liquid 237 mL  237 mL Oral BID BM Pokhrel, Laxman, MD   237 mL at 10/29/23 1016   FLUoxetine  (PROZAC ) capsule 10 mg  10 mg Oral QPM Patel, Ekta V, MD   10 mg at 10/28/23 1758   furosemide  (LASIX ) tablet 40 mg  40 mg Oral Daily Sheryle Donning, MD   40 mg at 10/29/23 1016   insulin  aspart (novoLOG ) injection 0-15 Units  0-15 Units Subcutaneous TID WC Lavanda Porter, MD   3 Units at 10/29/23 9604   metoprolol  succinate (TOPROL -XL) 24 hr tablet 100 mg  100 mg Oral Daily Pokhrel, Laxman, MD   100 mg at 10/29/23 1016   metoprolol  tartrate (LOPRESSOR ) tablet 12.5 mg  12.5 mg Oral Q6H Sheryle Donning, MD   12.5 mg at 10/29/23 5409   Oral care mouth rinse  15 mL Mouth Rinse PRN Pokhrel, Laxman, MD       sodium chloride  flush (NS) 0.9 % injection 3 mL  3 mL Intravenous Q12H Brunilda Capra V, MD   3 mL at 10/29/23 1017   zolpidem (AMBIEN) tablet 5 mg  5 mg Oral QHS PRN Pokhrel, Laxman, MD   5 mg at 10/27/23 2134     Discharge Medications: Please see discharge summary for a list of discharge medications.  Relevant Imaging Results:  Relevant Lab Results:   Additional Information SSN:[id number]  Carmon Christen, LCSWA

## 2023-10-30 DIAGNOSIS — E872 Acidosis, unspecified: Secondary | ICD-10-CM | POA: Diagnosis present

## 2023-10-30 DIAGNOSIS — K5909 Other constipation: Secondary | ICD-10-CM | POA: Diagnosis not present

## 2023-10-30 DIAGNOSIS — I482 Chronic atrial fibrillation, unspecified: Secondary | ICD-10-CM | POA: Diagnosis not present

## 2023-10-30 DIAGNOSIS — R1110 Vomiting, unspecified: Secondary | ICD-10-CM | POA: Diagnosis not present

## 2023-10-30 DIAGNOSIS — Z743 Need for continuous supervision: Secondary | ICD-10-CM | POA: Diagnosis not present

## 2023-10-30 DIAGNOSIS — E875 Hyperkalemia: Secondary | ICD-10-CM | POA: Diagnosis not present

## 2023-10-30 DIAGNOSIS — Z66 Do not resuscitate: Secondary | ICD-10-CM | POA: Diagnosis present

## 2023-10-30 DIAGNOSIS — E1142 Type 2 diabetes mellitus with diabetic polyneuropathy: Secondary | ICD-10-CM | POA: Diagnosis present

## 2023-10-30 DIAGNOSIS — Z7901 Long term (current) use of anticoagulants: Secondary | ICD-10-CM | POA: Diagnosis not present

## 2023-10-30 DIAGNOSIS — R55 Syncope and collapse: Secondary | ICD-10-CM | POA: Diagnosis not present

## 2023-10-30 DIAGNOSIS — A414 Sepsis due to anaerobes: Secondary | ICD-10-CM | POA: Diagnosis present

## 2023-10-30 DIAGNOSIS — G8929 Other chronic pain: Secondary | ICD-10-CM | POA: Diagnosis not present

## 2023-10-30 DIAGNOSIS — K5900 Constipation, unspecified: Secondary | ICD-10-CM | POA: Diagnosis not present

## 2023-10-30 DIAGNOSIS — R278 Other lack of coordination: Secondary | ICD-10-CM | POA: Diagnosis not present

## 2023-10-30 DIAGNOSIS — N179 Acute kidney failure, unspecified: Secondary | ICD-10-CM | POA: Diagnosis not present

## 2023-10-30 DIAGNOSIS — Z9981 Dependence on supplemental oxygen: Secondary | ICD-10-CM | POA: Diagnosis not present

## 2023-10-30 DIAGNOSIS — I5032 Chronic diastolic (congestive) heart failure: Secondary | ICD-10-CM | POA: Diagnosis present

## 2023-10-30 DIAGNOSIS — S32040A Wedge compression fracture of fourth lumbar vertebra, initial encounter for closed fracture: Secondary | ICD-10-CM | POA: Diagnosis not present

## 2023-10-30 DIAGNOSIS — A419 Sepsis, unspecified organism: Secondary | ICD-10-CM | POA: Diagnosis not present

## 2023-10-30 DIAGNOSIS — R0689 Other abnormalities of breathing: Secondary | ICD-10-CM | POA: Diagnosis not present

## 2023-10-30 DIAGNOSIS — Z7984 Long term (current) use of oral hypoglycemic drugs: Secondary | ICD-10-CM | POA: Diagnosis not present

## 2023-10-30 DIAGNOSIS — R0902 Hypoxemia: Secondary | ICD-10-CM | POA: Diagnosis not present

## 2023-10-30 DIAGNOSIS — I499 Cardiac arrhythmia, unspecified: Secondary | ICD-10-CM | POA: Diagnosis not present

## 2023-10-30 DIAGNOSIS — A4189 Other specified sepsis: Secondary | ICD-10-CM | POA: Diagnosis not present

## 2023-10-30 DIAGNOSIS — Z6841 Body Mass Index (BMI) 40.0 and over, adult: Secondary | ICD-10-CM | POA: Diagnosis not present

## 2023-10-30 DIAGNOSIS — N1832 Chronic kidney disease, stage 3b: Secondary | ICD-10-CM | POA: Diagnosis present

## 2023-10-30 DIAGNOSIS — Z741 Need for assistance with personal care: Secondary | ICD-10-CM | POA: Diagnosis not present

## 2023-10-30 DIAGNOSIS — R1314 Dysphagia, pharyngoesophageal phase: Secondary | ICD-10-CM | POA: Diagnosis not present

## 2023-10-30 DIAGNOSIS — E119 Type 2 diabetes mellitus without complications: Secondary | ICD-10-CM | POA: Diagnosis not present

## 2023-10-30 DIAGNOSIS — M6281 Muscle weakness (generalized): Secondary | ICD-10-CM | POA: Diagnosis not present

## 2023-10-30 DIAGNOSIS — D75839 Thrombocytosis, unspecified: Secondary | ICD-10-CM | POA: Diagnosis present

## 2023-10-30 DIAGNOSIS — N189 Chronic kidney disease, unspecified: Secondary | ICD-10-CM | POA: Diagnosis not present

## 2023-10-30 DIAGNOSIS — A0472 Enterocolitis due to Clostridium difficile, not specified as recurrent: Secondary | ICD-10-CM | POA: Diagnosis present

## 2023-10-30 DIAGNOSIS — Y92239 Unspecified place in hospital as the place of occurrence of the external cause: Secondary | ICD-10-CM | POA: Diagnosis not present

## 2023-10-30 DIAGNOSIS — L0390 Cellulitis, unspecified: Secondary | ICD-10-CM | POA: Diagnosis not present

## 2023-10-30 DIAGNOSIS — I959 Hypotension, unspecified: Secondary | ICD-10-CM | POA: Diagnosis not present

## 2023-10-30 DIAGNOSIS — R2681 Unsteadiness on feet: Secondary | ICD-10-CM | POA: Diagnosis not present

## 2023-10-30 DIAGNOSIS — I130 Hypertensive heart and chronic kidney disease with heart failure and stage 1 through stage 4 chronic kidney disease, or unspecified chronic kidney disease: Secondary | ICD-10-CM | POA: Diagnosis present

## 2023-10-30 DIAGNOSIS — M109 Gout, unspecified: Secondary | ICD-10-CM | POA: Diagnosis present

## 2023-10-30 DIAGNOSIS — D72829 Elevated white blood cell count, unspecified: Secondary | ICD-10-CM | POA: Diagnosis not present

## 2023-10-30 DIAGNOSIS — I480 Paroxysmal atrial fibrillation: Secondary | ICD-10-CM | POA: Diagnosis present

## 2023-10-30 DIAGNOSIS — I4891 Unspecified atrial fibrillation: Secondary | ICD-10-CM | POA: Diagnosis not present

## 2023-10-30 DIAGNOSIS — E8721 Acute metabolic acidosis: Secondary | ICD-10-CM | POA: Diagnosis present

## 2023-10-30 DIAGNOSIS — N1831 Chronic kidney disease, stage 3a: Secondary | ICD-10-CM | POA: Diagnosis not present

## 2023-10-30 DIAGNOSIS — R5381 Other malaise: Secondary | ICD-10-CM | POA: Diagnosis not present

## 2023-10-30 DIAGNOSIS — G4733 Obstructive sleep apnea (adult) (pediatric): Secondary | ICD-10-CM | POA: Diagnosis not present

## 2023-10-30 DIAGNOSIS — J309 Allergic rhinitis, unspecified: Secondary | ICD-10-CM | POA: Diagnosis not present

## 2023-10-30 DIAGNOSIS — S32040D Wedge compression fracture of fourth lumbar vertebra, subsequent encounter for fracture with routine healing: Secondary | ICD-10-CM | POA: Diagnosis not present

## 2023-10-30 DIAGNOSIS — M4856XA Collapsed vertebra, not elsewhere classified, lumbar region, initial encounter for fracture: Secondary | ICD-10-CM | POA: Diagnosis present

## 2023-10-30 DIAGNOSIS — R29810 Facial weakness: Secondary | ICD-10-CM | POA: Diagnosis not present

## 2023-10-30 DIAGNOSIS — D649 Anemia, unspecified: Secondary | ICD-10-CM | POA: Diagnosis not present

## 2023-10-30 DIAGNOSIS — R6510 Systemic inflammatory response syndrome (SIRS) of non-infectious origin without acute organ dysfunction: Secondary | ICD-10-CM | POA: Diagnosis not present

## 2023-10-30 DIAGNOSIS — E559 Vitamin D deficiency, unspecified: Secondary | ICD-10-CM | POA: Diagnosis not present

## 2023-10-30 DIAGNOSIS — R6520 Severe sepsis without septic shock: Secondary | ICD-10-CM | POA: Diagnosis present

## 2023-10-30 DIAGNOSIS — I10 Essential (primary) hypertension: Secondary | ICD-10-CM | POA: Diagnosis not present

## 2023-10-30 DIAGNOSIS — L219 Seborrheic dermatitis, unspecified: Secondary | ICD-10-CM | POA: Diagnosis not present

## 2023-10-30 DIAGNOSIS — E876 Hypokalemia: Secondary | ICD-10-CM | POA: Diagnosis present

## 2023-10-30 DIAGNOSIS — Z794 Long term (current) use of insulin: Secondary | ICD-10-CM | POA: Diagnosis not present

## 2023-10-30 DIAGNOSIS — I5030 Unspecified diastolic (congestive) heart failure: Secondary | ICD-10-CM | POA: Diagnosis not present

## 2023-10-30 DIAGNOSIS — J9601 Acute respiratory failure with hypoxia: Secondary | ICD-10-CM | POA: Diagnosis present

## 2023-10-30 DIAGNOSIS — R400 Somnolence: Secondary | ICD-10-CM | POA: Diagnosis not present

## 2023-10-30 DIAGNOSIS — Z9181 History of falling: Secondary | ICD-10-CM | POA: Diagnosis not present

## 2023-10-30 DIAGNOSIS — Z7401 Bed confinement status: Secondary | ICD-10-CM | POA: Diagnosis not present

## 2023-10-30 DIAGNOSIS — E785 Hyperlipidemia, unspecified: Secondary | ICD-10-CM | POA: Diagnosis present

## 2023-10-30 DIAGNOSIS — K567 Ileus, unspecified: Secondary | ICD-10-CM | POA: Diagnosis not present

## 2023-10-30 DIAGNOSIS — W19XXXD Unspecified fall, subsequent encounter: Secondary | ICD-10-CM | POA: Diagnosis not present

## 2023-10-30 DIAGNOSIS — E1122 Type 2 diabetes mellitus with diabetic chronic kidney disease: Secondary | ICD-10-CM | POA: Diagnosis present

## 2023-10-30 DIAGNOSIS — R112 Nausea with vomiting, unspecified: Secondary | ICD-10-CM | POA: Diagnosis not present

## 2023-10-30 DIAGNOSIS — R404 Transient alteration of awareness: Secondary | ICD-10-CM | POA: Diagnosis not present

## 2023-10-30 DIAGNOSIS — G9341 Metabolic encephalopathy: Secondary | ICD-10-CM | POA: Diagnosis present

## 2023-10-30 DIAGNOSIS — R6889 Other general symptoms and signs: Secondary | ICD-10-CM | POA: Diagnosis not present

## 2023-10-30 LAB — BASIC METABOLIC PANEL WITH GFR
Anion gap: 13 (ref 5–15)
BUN: 31 mg/dL — ABNORMAL HIGH (ref 8–23)
CO2: 22 mmol/L (ref 22–32)
Calcium: 8.9 mg/dL (ref 8.9–10.3)
Chloride: 104 mmol/L (ref 98–111)
Creatinine, Ser: 1.62 mg/dL — ABNORMAL HIGH (ref 0.61–1.24)
GFR, Estimated: 42 mL/min — ABNORMAL LOW (ref >60)
Glucose, Bld: 139 mg/dL — ABNORMAL HIGH (ref 70–99)
Potassium: 3.1 mmol/L — ABNORMAL LOW (ref 3.5–5.1)
Sodium: 139 mmol/L (ref 135–145)

## 2023-10-30 LAB — CBC WITH DIFFERENTIAL/PLATELET
Abs Immature Granulocytes: 0.11 10*3/uL — ABNORMAL HIGH (ref 0.00–0.07)
Basophils Absolute: 0.1 10*3/uL (ref 0.0–0.1)
Basophils Relative: 0 %
Eosinophils Absolute: 0.4 10*3/uL (ref 0.0–0.5)
Eosinophils Relative: 2 %
HCT: 40.4 % (ref 39.0–52.0)
Hemoglobin: 13.1 g/dL (ref 13.0–17.0)
Immature Granulocytes: 1 %
Lymphocytes Relative: 11 %
Lymphs Abs: 1.9 10*3/uL (ref 0.7–4.0)
MCH: 27.3 pg (ref 26.0–34.0)
MCHC: 32.4 g/dL (ref 30.0–36.0)
MCV: 84.3 fL (ref 80.0–100.0)
Monocytes Absolute: 2.1 10*3/uL — ABNORMAL HIGH (ref 0.1–1.0)
Monocytes Relative: 12 %
Neutro Abs: 12.5 10*3/uL — ABNORMAL HIGH (ref 1.7–7.7)
Neutrophils Relative %: 74 %
Platelets: 385 10*3/uL (ref 150–400)
RBC: 4.79 MIL/uL (ref 4.22–5.81)
RDW: 18.5 % — ABNORMAL HIGH (ref 11.5–15.5)
WBC: 17.1 10*3/uL — ABNORMAL HIGH (ref 4.0–10.5)
nRBC: 0 % (ref 0.0–0.2)

## 2023-10-30 LAB — GLUCOSE, CAPILLARY
Glucose-Capillary: 150 mg/dL — ABNORMAL HIGH (ref 70–99)
Glucose-Capillary: 136 mg/dL — ABNORMAL HIGH (ref 70–99)
Glucose-Capillary: 159 mg/dL — ABNORMAL HIGH (ref 70–99)

## 2023-10-30 LAB — HEPATIC FUNCTION PANEL
ALT: 14 U/L (ref 0–44)
AST: 17 U/L (ref 15–41)
Albumin: 2.9 g/dL — ABNORMAL LOW (ref 3.5–5.0)
Alkaline Phosphatase: 90 U/L (ref 38–126)
Bilirubin, Direct: 0.2 mg/dL (ref 0.0–0.2)
Indirect Bilirubin: 0.7 mg/dL (ref 0.3–0.9)
Total Bilirubin: 0.9 mg/dL (ref 0.0–1.2)
Total Protein: 6 g/dL — ABNORMAL LOW (ref 6.5–8.1)

## 2023-10-30 LAB — MAGNESIUM: Magnesium: 1.9 mg/dL (ref 1.7–2.4)

## 2023-10-30 LAB — AMMONIA: Ammonia: 19 umol/L (ref 9–35)

## 2023-10-30 MED ORDER — METOPROLOL SUCCINATE ER 100 MG PO TB24: 100.0000 mg | ORAL_TABLET | Freq: Two times a day (BID) | ORAL | Status: DC

## 2023-10-30 MED ORDER — PREGABALIN 50 MG PO CAPS: 50.0000 mg | ORAL_CAPSULE | Freq: Two times a day (BID) | ORAL | 0 refills | Status: DC

## 2023-10-30 MED ORDER — METOPROLOL SUCCINATE ER 100 MG PO TB24: 100.0000 mg | ORAL_TABLET | Freq: Two times a day (BID) | ORAL | Status: AC

## 2023-10-30 MED ORDER — POTASSIUM CHLORIDE CRYS ER 20 MEQ PO TBCR
40.0000 meq | EXTENDED_RELEASE_TABLET | Freq: Two times a day (BID) | ORAL | Status: DC
  Administered 2023-10-30: 40 meq via ORAL
  Filled 2023-10-30: qty 2

## 2023-10-30 MED ORDER — APIXABAN 2.5 MG PO TABS: 2.5000 mg | ORAL_TABLET | Freq: Two times a day (BID) | ORAL | Status: DC

## 2023-10-30 MED ORDER — ENSURE PLUS HIGH PROTEIN PO LIQD
237.0000 mL | Freq: Two times a day (BID) | ORAL | Status: AC
Start: 2023-10-30 — End: ?

## 2023-10-30 NOTE — Progress Notes (Signed)
 Rounding Note   Patient Name: Steve Jensen Date of Encounter: 10/30/2023  [facility] Cardiologist: Sheryle Donning, MD   Subjective  Pt less conversant today, no complaints, but cough on lung exam.  Scheduled Meds:  apixaban   2.5 mg Oral BID   feeding supplement  237 mL Oral BID BM   FLUoxetine   10 mg Oral QPM   furosemide   40 mg Oral Daily   insulin  aspart  0-15 Units Subcutaneous TID WC   metoprolol  succinate  100 mg Oral Daily   metoprolol  tartrate  25 mg Oral Q6H   potassium chloride   40 mEq Oral BID   sodium chloride  flush  3 mL Intravenous Q12H   Continuous Infusions:  PRN Meds: acetaminophen  **OR** acetaminophen , mouth rinse, zolpidem   Vital Signs  Vitals:   10/29/23 2313 10/30/23 0428 10/30/23 0523 10/30/23 0725  BP: 108/85 115/82 116/89 130/79  Pulse: (!) 108 93 (!) 111   Resp: 16 19    Temp: 98.3 F (36.8 C) 97.6 F (36.4 C)  (!) 97.5 F (36.4 C)  TempSrc: Oral Oral  Oral  SpO2: 98% 97%    Weight:  123.7 kg    Height:        Intake/Output Summary (Last 24 hours) at 10/30/2023 0827 Last data filed at 10/30/2023 0423 Gross per 24 hour  Intake 953 ml  Output 2100 ml  Net -1147 ml      10/30/2023    4:28 AM 10/29/2023    5:00 AM 10/28/2023    3:48 AM  Last 3 Weights  Weight (lbs) 272 lb 11.3 oz 274 lb 11.1 oz 279 lb 1.6 oz  Weight (kg) 123.7 kg 124.6 kg 126.6 kg      Telemetry Afib with rates in the 90-100s - Personally Reviewed   Physical Exam  GEN: No acute distress.   Neck: No JVD Cardiac: irregular rhythm, regular rate  Respiratory: respirations unlabored, do not appreciate crackles, cough on exam GI: Soft, nontender, non-distended  MS: minimal B LE edema; No deformity. Neuro:  Nonfocal  Psych: Normal affect   Labs High Sensitivity Troponin:   Recent Labs  Lab 10/24/23 1452 10/24/23 2002  TROPONINIHS 9 8     Chemistry Recent Labs  Lab 10/27/23 0531 10/28/23 0352 10/29/23 0400 10/30/23 0517  NA 139 139 141  139  K 3.2* 3.5 3.7 3.1*  CL 107 106 109 104  CO2 22 21* 23 22  GLUCOSE 131* 164* 157* 139*  BUN 22 26* 31* 31*  CREATININE 1.74* 1.66* 1.81* 1.62*  CALCIUM  8.6* 9.0 9.2 8.9  MG 1.9  --  1.9 1.9  GFRNONAA 39* 41* 37* 42*  ANIONGAP 10 12 9 13     Lipids No results for input(s): "CHOL", "TRIG", "HDL", "LABVLDL", "LDLCALC", "CHOLHDL" in the last 168 hours.  Hematology Recent Labs  Lab 10/28/23 0352 10/29/23 0400 10/30/23 0517  WBC 17.7* 20.0* 17.1*  RBC 4.88 4.79 4.79  HGB 13.0 13.2 13.1  HCT 40.4 40.3 40.4  MCV 82.8 84.1 84.3  MCH 26.6 27.6 27.3  MCHC 32.2 32.8 32.4  RDW 18.9* 19.6* 18.5*  PLT 364 364 385   Thyroid  No results for input(s): "TSH", "FREET4" in the last 168 hours.  BNP Recent Labs  Lab 10/24/23 1123  BNP 411.2*    DDimer No results for input(s): "DDIMER" in the last 168 hours.   Radiology  No results found.  Cardiac Studies  Echo 09/04/23:  1. Left ventricular ejection fraction, by estimation, is  55 to 60%. The  left ventricle has normal function. The left ventricle has no regional  wall motion abnormalities. Left ventricular diastolic function could not  be evaluated.   2. Right ventricular systolic function is mildly reduced. The right  ventricular size is mildly enlarged. Tricuspid regurgitation signal is  inadequate for assessing PA pressure.   3. Left atrial size was mildly dilated.   4. The mitral valve is normal in structure. Trivial mitral valve  regurgitation. No evidence of mitral stenosis.   5. The aortic valve is calcified. There is moderate calcification of the  aortic valve. There is mild thickening of the aortic valve. Aortic valve  regurgitation is mild. Aortic valve sclerosis/calcification is present,  without any evidence of aortic  stenosis.   6. The inferior vena cava is normal in size with greater than 50%  respiratory variability, suggesting right atrial pressure of 3 mmHg.   Patient Profile   83 y.o. male with a hx of  longstanding persistent vs. Permanent atrial fibrillation, chronic diastolic heart failure, ckd 3a, history of hepatocellular carcinoma and prostate cancer, type II diabetes, and OSA who is being seen for the evaluation of hypotension.   Assessment & Plan   Longstanding persistent vs permanent atrial fibrillation - RVR on presentation, but recent hospitalization with bradycardia (08/2023) - tolerating lopressor  succinate 100 mg daily along with additional 25 mg lopressor  q6h - telemetry without bradycardia - consolidate BB today   HFpEF - have resumed 40 mg lasix  daily, appears euvolemic today - will keep on daily instead of BID   Hypotension AKI Leukocytosis - unclear if AKI related to hypotension - has not been hypertensive overnight - creatinine improving - WBC improving   AMS - unclear baseline, but responses are delayed - infectious workup per primary has been largely unrevealing - WBC trending down, cough on exam - will add on LFTs and ammonia       For questions or updates, please contact [facility] Please consult [email or web address] for contact info under     Signed, Lamond Pilot, PA  10/30/2023, 8:27 AM

## 2023-10-30 NOTE — Plan of Care (Signed)
  Problem: Metabolic:  Problem: Nutritional: Goal: Maintenance of adequate nutrition will improve Outcome: Progressing   Problem: Skin Integrity: Goal: Risk for impaired skin integrity will decrease Outcome: Progressing

## 2023-10-30 NOTE — TOC Progression Note (Addendum)
 Transition of Care [facility]) - Progression Note    Patient Details  Name: Steve Jensen MRN: [id number] Date of Birth: 1941-03-23  [facility] CM/SW Contact  Carmon Christen, LCSWA Phone Number: 10/30/2023, 1:08 PM  Clinical Narrative:     Patient has SNF bed at [facility]. CSW called patients insurance. Patients insurance informed CSW that insurance authorization will be good through tomorrow 6/7 as long as patient discharges to facility by 11:59pm. Amadeo Backers with [facility] informed CSW that they can accept patient tomorrow if can have DC summary into them by 11:30am. If not, facility can accept patient on Monday. If plan is for discharge on Monday CSW will need to restart insurance authorization on Sunday 6/8. CSW informed MD. CSW will continue to follow.  Update- CSW received call back from Kristin with [facility] who informed CSW that facility will actually not be able to accept patient until Monday . Charge nurse will not be at facility over weekend so, will not be able to accept patient over weekend. Weekend CSW will need to restart insurance auth for patient to be ready for Monday if medically stable. CSW updated MD.  Update- CSW informed by MD that patient medically ready for dc. CSW informed Amadeo Backers with [facility]. [facility] confirmed facility can accept patient today.   Expected Discharge Plan: Skilled Nursing Facility Barriers to Discharge: Continued Medical Work up, English as a second language teacher  Expected Discharge Plan and Services       Living arrangements for the past 2 months: Assisted Living Facility                                       Social Determinants of Health (SDOH) Interventions SDOH Screenings   Food Insecurity: No Food Insecurity (10/25/2023)  Housing: Low Risk  (10/25/2023)  Transportation Needs: No Transportation Needs (10/25/2023)  Utilities: Not At Risk (10/25/2023)  Social Connections: Socially Isolated (10/27/2023)   Tobacco Use: Medium Risk (10/24/2023)    Readmission Risk Interventions    12/26/2021   12:51 PM 12/25/2021    3:31 PM 12/19/2021   12:39 PM  Readmission Risk Prevention Plan  Transportation Screening  Complete Complete  PCP or Specialist Appt within 5-7 Days Complete    Home Care Screening  Complete   Medication Review (RN CM)  Complete   HRI or Home Care Consult   Complete  Social Work Consult for Recovery Care Planning/Counseling   Complete  Palliative Care Screening   Not Applicable  Medication Review Oceanographer)   Complete

## 2023-10-30 NOTE — Discharge Summary (Signed)
 Physician Discharge Summary  Steve Jensen MVH:[id number] DOB: 1940-12-09 DOA: 10/24/2023  PCP: Glena Landau, MD  Admit date: 10/24/2023 Discharge date: 10/30/2023  Admitted From: Home  Discharge disposition: Skilled nursing facility   Recommendations for Outpatient Follow-Up:   Follow up with your primary care provider at the skilled nursing facility in 3 to 5 days. Check CBC, BMP, magnesium  in the next visit Follow-up with cardiology as outpatient as scheduled by the clinic. Dose of Eliquis  has been changed to 2.5 mg twice daily due to renal function.  Adjust to 55 mg twice daily once renal function is better. Please monitor potassium as outpatient and adjust doses of potassium if necessary. Please take note that dose of metoprolol  succinate has been increased to 100 mg twice daily. Recommend wearing lumbar brace on ambulation.  Discharge Diagnosis:   Principal Problem:   AMS (altered mental status) Active Problems:   Severe sepsis with acute organ dysfunction (HCC)   Essential hypertension, benign   PAF (paroxysmal atrial fibrillation) (HCC)   Uncontrolled type 2 diabetes mellitus with hyperglycemia, without long-term current use of insulin  (HCC)   AKI (acute kidney injury) (HCC)   Chronic heart failure with preserved ejection fraction (HFpEF) (HCC)   SOB (shortness of breath)   Persistent atrial fibrillation with RVR (HCC)   Discharge Condition: Improved.  Diet recommendation: Low sodium, heart healthy.  Carbohydrate-modified.   Wound care: None.  Code status: DNR   History of Present Illness:   Steve Jensen is a 83 y.o. male with past medical history atrial fibrillation on Eliquis , hypertension, chronic heart failure with preserved ejection, OSA on CPAP, obesity, type 2 diabetes melitis, CKD stage IIIa, GERD, hepatocellular carcinoma, history of prostate cancer, falls, presented to the hospital from skilled nursing facility with complaints of shortness of  breath. Patient was noted to be hypoxic with pulse ox of 88% on room air on arrival. He was also noted to have A-fib with RVR and received IV fluids. Of note patient was last hospitalized in April 2025 for syncope and concerns for bradycardia. At that time Cardizem  was discontinued and a Holter monitor was placed. Patient was subsequently seen on 27th May 2025 by cardiology and was continued on metoprolol  with Lasix  and Zetia . Patient did not complete his Holter monitor. On this presentation, at the skilled nursing facility patient was noted to be very weak and disoriented and blood pressure was low. Subsequently, had a fall. In the ED, blood pressure was better. Labs showed creatinine of 2.3 with lactate elevation at 3.6. BNP elevated at 411. Review of previous 2D echocardiogram from 09/04/2023 showed LV ejection fraction of 55 to 60%. Patient was then considered for admission to the hospital for further evaluation and treatment.    Hospital Course:   Following conditions were addressed during hospitalization as listed below,  Altered mental status. Likely secondary to hypotension.  MRI of the brain without any acute findings.  Patient likely at baseline.  Alert awake and Communicative.  Has impaired memory.  Family confirmed that he has been having some cognitive issues.  Has impaired memory.  Will decrease the dose of Lyrica .  Avoid sedative-hypnotics if possible.  Ammonia and LFTs level was within normal range.   Hypotension Resolved at this time.  No orthostatic hypotension on checking.  Seen by cardiology.    Leukocytosis. No obvious signs of infection.  Has elevated WBC without signs of infection.    Blood cultures negative in 4 days.  Urine culture insignificant.  Chest x-ray without any infiltrate.  MRSA PCR negative.  Was initially antibiotic Rocephin  discontinued.  He did have brief diarrhea.  Uncertain etiology of leukocytosis.  Recommend CBC monitoring.  No antibiotics recommended at  this time.  Hypokalemia.  will continue to receive potassium supplements at discharge.  Potassium was given prior to discharge.  Recommend checking potassium in 3 to 5 days.   AKI: Baseline creatinine around 1.3-1.4.  Creatinine on presentation at 2.3.  Current today at 1.6.  Will continue on oral Lasix  on discharge.  Dose of Lasix  will be changed once a day.   Falls/ Acute compression fracture of the L4 : CT head without any acute findings.  Patient complained of lower back pain show CT of the lumbar spine showed acute compression of the L4 fracture with 3 mm retropulsion.  Neurosurgery on-call was consulted by ED who  recommended LSO brace and outpatient follow-up. PT OT recommends skilled nursing facility placement.     Anemia.   Hemoglobin of 12.2 on presentation.  Will continue to monitor CBC.  Hemoglobin today at 13.1.    History of chronic heart failure with preserved ejection fraction. 2D echocardiogram with LV EF 55-60 %..  Was on Lasix  twice daily and metoprolol  succinate at home.  At this time patient's medication has been changed to oral Lasix  daily and metoprolol  succinate has been changed to twice a day.    Diabetes mellitus type II. Continue diabetic diet on metformin  as outpatient.    Paroxysmal atrial fibrillation with RVR  Continue Eliquis  and metoprolol  succinate.  Dose of metoprolol  has been changed to 100 mg twice daily at this time.  Cardiology has seen the patient today and okay for heart rate being marginally high the previous history of bradycardia.  Plan for follow-up with cardiology as outpatient.  Dose of Eliquis  has been changed to 2.5 mg twice daily for now.  Can change back to 5 mg twice daily once the renal function has improved.   Lactic acidosis: Resolved.   Class III obesity.  Would benefit from lifestyle modification and weight loss.  Body mass index is 40.57 kg/m.   Disposition.  At this time, patient is stable for disposition back to skilled nursing  facility.  Recommend follow-up with cardiology as an outpatient.  Medical Consultants:   neurosurgery Cardiology   Procedures:    none Subjective:   Today, patient was seen and examined at bedside.  Denies any interval complaints.  Denies any nausea vomiting fever chills rigors.  No chest pain palpitation.  Overall poor historian.  Discharge Exam:   Vitals:   10/30/23 1031 10/30/23 1210  BP: 113/71 116/78  Pulse: (!) 110 (!) 110  Resp:  15  Temp:  (!) 97.3 F (36.3 C)  SpO2:  95%   Vitals:   10/30/23 0523 10/30/23 0725 10/30/23 1031 10/30/23 1210  BP: 116/89 130/79 113/71 116/78  Pulse: (!) 111  (!) 110 (!) 110  Resp:    15  Temp:  (!) 97.5 F (36.4 C)  (!) 97.3 F (36.3 C)  TempSrc:  Oral  Oral  SpO2:    95%  Weight:      Height:       Body mass index is 40.27 kg/m.   General: Alert awake, not in obvious distress, on nasal cannula oxygen , obese built HENT: pupils equally reacting to light,  No scleral pallor or icterus noted. Oral mucosa is moist.  Chest:    Diminished breath sounds bilaterally. No crackles or  wheezes.  CVS: S1 &S2 heard. No murmur.  Irregular rhythm with mild tachycardia. Abdomen: Soft, nontender, nondistended.  Bowel sounds are heard.   Extremities: No cyanosis, clubbing with edema.  Peripheral pulses are palpable. Psych: Alert, awake with impaired memory CNS:  moves all extremities.   Skin: Warm and dry.  No rashes noted.  The results of significant diagnostics from this hospitalization (including imaging, microbiology, ancillary and laboratory) are listed below for reference.     Diagnostic Studies:   CT Lumbar Spine Wo Contrast Result Date: 10/24/2023 EXAM: CT OF THE LUMBAR SPINE WITHOUT CONTRAST 10/24/2023 05:53:58 PM TECHNIQUE: CT of the lumbar spine was performed without the administration of intravenous contrast. Multiplanar reformatted images are provided for review. Automated exposure control, iterative reconstruction, and/or  weight based adjustment of the mA/kV was utilized to reduce the radiation dose to as low as reasonably achievable. COMPARISON: Abdominal CT dated 12/23/2021. CLINICAL HISTORY: Back trauma, no prior imaging (Age >= 16y). Chief complaints; Shortness of Breath; CT Head Wo Contrast; Mental status change, unknown cause; CT Lumbar Spine Wo Contrast; Back trauma, no prior imaging (Age >= 16y) FINDINGS: BONES AND ALIGNMENT: Diminutive ribs at T12 with partial sacralization of the L5 vertebral segment. Acute compression fracture of the L4 vertebral body with 3 mm retropulsion of the superior endplate and mild height loss. Linear sclerosis underlying the L1 superior endplate, suggestive of subacute to chronic compression fracture with minimal height loss. Moderate degenerative changes of the bilateral sacroiliac joints. DEGENERATIVE CHANGES: Multilevel lumbar spondylosis, most significant at L2-3, where there is at least mild spinal canal stenosis. SOFT TISSUES: Atherosclerotic calcifications of the abdominal aorta and its branches. Focal ectasia of the infrarenal aorta, measuring up to 3.2 cm on axial image 54 series 5. According to the guideline on Abdominal Aortic Aneurysms (AAA), the finding is consistent with a focal ectasia of the infrarenal aorta measuring 3.2 cm, which falls under the category of a small aneurysm. The recommendation is to follow up with imaging surveillance every 3 years, as per the SVS 2018 guidelines for aneurysms measuring 3.03.9 cm. IMPRESSION: 1. Acute compression fracture of the L4 vertebral body with 3 mm retropulsion of the superior endplate and mild height loss. 2. Linear sclerosis underlying the L1 superior endplate, suggestive of subacute to chronic compression fracture with minimal height loss. 3. Multilevel lumbar spondylosis, most significant at L2-3, where there is at least mild spinal canal stenosis. 4. Focal ectasia of the infrarenal aorta, measuring up to 3.2 cm, consistent with a  small aneurysm. Follow-up imaging surveillance every 3 years is recommended, as per the SVS 2018 guidelines for aneurysms measuring 3.03.9 cm. Electronically signed by: Audra Blend MD 10/24/2023 06:30 PM EDT RP Workstation: [id number]   CT Head Wo Contrast Result Date: 10/24/2023 CLINICAL DATA:  Mental status change, unknown cause EXAM: CT HEAD WITHOUT CONTRAST TECHNIQUE: Contiguous axial images were obtained from the base of the skull through the vertex without intravenous contrast. RADIATION DOSE REDUCTION: This exam was performed according to the departmental dose-optimization program which includes automated exposure control, adjustment of the mA and/or kV according to patient size and/or use of iterative reconstruction technique. COMPARISON:  CT head 09/03/2023 FINDINGS: Brain: Patchy and confluent areas of decreased attenuation are noted throughout the deep and periventricular white matter of the cerebral hemispheres bilaterally, compatible with chronic microvascular ischemic disease. No evidence of large-territorial acute infarction. No parenchymal hemorrhage. No mass lesion. No extra-axial collection. No mass effect or midline shift. No hydrocephalus. Basilar cisterns are patent.  Vascular: No hyperdense vessel. Skull: No acute fracture or focal lesion. Sinuses/Orbits: Paranasal sinuses and mastoid air cells are clear. Bilateral lens replacement. Otherwise the orbits are unremarkable. Other: None. IMPRESSION: No acute intracranial abnormality. Electronically Signed   By: Morgane  Naveau M.D.   On: 10/24/2023 18:22   DG Chest 2 View Result Date: 10/24/2023 CLINICAL DATA:  Shortness of breath EXAM: CHEST - 2 VIEW COMPARISON:  Chest x-ray performed September 03, 2023 FINDINGS: Low lung volumes with enlarged heart central pulmonary vascular congestion. No significant pleural effusion. No pneumothorax. Osteopenia. Chronic bony deformities. IMPRESSION: 1. Enlarged heart with central pulmonary vascular  congestion. 2. No significant pleural effusion. Electronically Signed   By: Reagan Camera M.D.   On: 10/24/2023 12:28     Labs:   Basic Metabolic Panel: Recent Labs  Lab 10/24/23 1123 10/25/23 0447 10/26/23 0541 10/27/23 0531 10/28/23 0352 10/29/23 0400 10/30/23 0517  NA 143   < > 139 139 139 141 139  K 4.0   < > 3.7 3.2* 3.5 3.7 3.1*  CL 105   < > 104 107 106 109 104  CO2 22   < > 26 22 21* 23 22  GLUCOSE 163*   < > 122* 131* 164* 157* 139*  BUN 26*   < > 22 22 26* 31* 31*  CREATININE 2.39*   < > 1.82* 1.74* 1.66* 1.81* 1.62*  CALCIUM  9.1   < > 8.6* 8.6* 9.0 9.2 8.9  MG 1.8  --  1.9 1.9  --  1.9 1.9   < > = values in this interval not displayed.   GFR Estimated Creatinine Clearance: 46.5 mL/min (A) (by C-G formula based on SCr of 1.62 mg/dL (H)). Liver Function Tests: Recent Labs  Lab 10/30/23 1136  AST 17  ALT 14  ALKPHOS 90  BILITOT 0.9  PROT 6.0*  ALBUMIN  2.9*   No results for input(s): "LIPASE", "AMYLASE" in the last 168 hours. Recent Labs  Lab 10/30/23 1136  AMMONIA 19   Coagulation profile No results for input(s): "INR", "PROTIME" in the last 168 hours.  CBC: Recent Labs  Lab 10/24/23 1123 10/26/23 0541 10/27/23 0531 10/28/23 0352 10/29/23 0400 10/30/23 0517  WBC 13.2* 11.4* 13.5* 17.7* 20.0* 17.1*  NEUTROABS 9.5*  --   --   --   --  12.5*  HGB 12.2* 12.5* 12.7* 13.0 13.2 13.1  HCT 38.5* 38.9* 39.2 40.4 40.3 40.4  MCV 84.6 83.1 83.1 82.8 84.1 84.3  PLT 291 282 309 364 364 385   Cardiac Enzymes: No results for input(s): "CKTOTAL", "CKMB", "CKMBINDEX", "TROPONINI" in the last 168 hours. BNP: Invalid input(s): "POCBNP" CBG: Recent Labs  Lab 10/29/23 1220 10/29/23 1646 10/29/23 2134 10/30/23 0724 10/30/23 1207  GLUCAP 201* 188* 144* 150* 136*   D-Dimer No results for input(s): "DDIMER" in the last 72 hours. Hgb A1c No results for input(s): "HGBA1C" in the last 72 hours. Lipid Profile No results for input(s): "CHOL", "HDL",  "LDLCALC", "TRIG", "CHOLHDL", "LDLDIRECT" in the last 72 hours. Thyroid  function studies No results for input(s): "TSH", "T4TOTAL", "T3FREE", "THYROIDAB" in the last 72 hours.  Invalid input(s): "FREET3" Anemia work up No results for input(s): "VITAMINB12", "FOLATE", "FERRITIN", "TIBC", "IRON", "RETICCTPCT" in the last 72 hours. Microbiology Recent Results (from the past 240 hours)  Culture, blood (Routine X 2) w Reflex to ID Panel     Status: None   Collection Time: 10/24/23  2:51 PM   Specimen: BLOOD  Result Value Ref Range Status  Specimen Description BLOOD RIGHT ANTECUBITAL  Final   Special Requests   Final    BOTTLES DRAWN AEROBIC AND ANAEROBIC Blood Culture adequate volume   Culture   Final    NO GROWTH 5 DAYS Performed at [facility], [address] [place], [place] [address]    Report Status 10/29/2023 FINAL  Final  Culture, blood (Routine X 2) w Reflex to ID Panel     Status: None   Collection Time: 10/24/23  2:56 PM   Specimen: BLOOD  Result Value Ref Range Status   Specimen Description BLOOD LEFT ANTECUBITAL  Final   Special Requests   Final    BOTTLES DRAWN AEROBIC AND ANAEROBIC Blood Culture results may not be optimal due to an inadequate volume of blood received in culture bottles   Culture   Final    NO GROWTH 5 DAYS Performed at [facility], [address] [place], [place] [address]    Report Status 10/29/2023 FINAL  Final  Urine Culture (for pregnant, neutropenic or urologic patients or patients with an indwelling urinary catheter)     Status: Abnormal   Collection Time: 10/24/23 10:23 PM   Specimen: Urine, Clean Catch  Result Value Ref Range Status   Specimen Description URINE, CLEAN CATCH  Final   Special Requests NONE  Final   Culture (A)  Final    <10,000 COLONIES/mL INSIGNIFICANT GROWTH Performed at [facility], [address] [place], [place] [address]    Report Status 10/25/2023 FINAL  Final  MRSA Next Gen by PCR, Nasal      Status: None   Collection Time: 10/24/23 10:23 PM   Specimen: Urine, Clean Catch; Nasal Swab  Result Value Ref Range Status   MRSA by PCR Next Gen NOT DETECTED NOT DETECTED Final    Comment: (NOTE) The GeneXpert MRSA Assay (FDA approved for NASAL specimens only), is one component of a comprehensive MRSA colonization surveillance program. It is not intended to diagnose MRSA infection nor to guide or monitor treatment for MRSA infections. Test performance is not FDA approved in patients less than 75 years old. Performed at [facility], [address], [place], [place] [address]      Discharge Instructions:   Discharge Instructions     (HEART FAILURE PATIENTS) Call MD:  Anytime you have any of the following symptoms: 1) 3 pound weight gain in 24 hours or 5 pounds in 1 week 2) shortness of breath, with or without a dry hacking cough 3) swelling in the hands, feet or stomach 4) if you have to sleep on extra pillows at night in order to breathe.   Complete by: As directed    Diet - low sodium heart healthy   Complete by: As directed    Fluid restriction 1500 mL/day.   Diet Carb Modified   Complete by: As directed    Discharge instructions   Complete by: As directed    Follow-up with your primary care provider at the skilled nursing facility in 3 to 5 days.  Check blood work at that time.  Seek medical attention for any worsening symptoms.  Follow-up with cardiology as outpatient.  Daily weights, fluid restriction 1500 mL/day.   Increase activity slowly   Complete by: As directed    No wound care   Complete by: As directed       Allergies as of 10/30/2023       Reactions   Lipitor [atorvastatin] Other (See  Comments)   Myalgias   Zithromax [azithromycin] Other (See Comments)   GI intolerance   Zocor [simvastatin] Other (See Comments)   Myalgias    Norvasc  [amlodipine ] Swelling        Medication List     STOP taking these medications    Baclofen 5 MG Tabs    benzonatate  100 MG capsule Commonly known as: TESSALON        TAKE these medications    allopurinol  100 MG tablet Commonly known as: ZYLOPRIM  Take 100 mg by mouth in the morning.   apixaban  2.5 MG Tabs tablet Commonly known as: ELIQUIS  Take 1 tablet (2.5 mg total) by mouth 2 (two) times daily. TAKE 1 TABLET(5 MG) BY MOUTH TWICE DAILY What changed:  medication strength how much to take how to take this when to take this   docusate sodium  100 MG capsule Commonly known as: COLACE Take 100 mg by mouth 2 (two) times daily.   ezetimibe  10 MG tablet Commonly known as: ZETIA  Take 10 mg by mouth in the morning.   feeding supplement Liqd Take 237 mLs by mouth 2 (two) times daily between meals.   ferrous sulfate 325 (65 FE) MG tablet Take 325 mg by mouth daily with breakfast.   FISH OIL PO Take 1 capsule by mouth 2 (two) times daily.   FLUoxetine  10 MG capsule Commonly known as: PROZAC  Take 10 mg by mouth every evening.   furosemide  40 MG tablet Commonly known as: LASIX  Take 40 mg by mouth in the morning. What changed: Another medication with the same name was removed. Continue taking this medication, and follow the directions you see here.   guaiFENesin  600 MG 12 hr tablet Commonly known as: MUCINEX  Take 600 mg by mouth 2 (two) times daily as needed for cough or to loosen phlegm.   insulin  aspart 100 UNIT/ML injection Commonly known as: novoLOG  Inject 0-10 Units into the skin 3 (three) times daily before meals. Sliding scale: if sugar is 150-200 = 2 units, if 201-250 = 4 units, if 251-300 = 6 units, if 301-350 = 8 units, if 351-400 = 10 units, give blood sugar is greater than 400 then call the MD.   leptospermum manuka honey Pste paste Apply 1 Application topically daily. Monday and Thursday   melatonin 5 MG Tabs Take 5 mg by mouth at bedtime.   metFORMIN  1000 MG tablet Commonly known as: GLUCOPHAGE  Take 1,000 mg by mouth 2 (two) times daily with a meal.    metoprolol  succinate 100 MG 24 hr tablet Commonly known as: TOPROL -XL Take 1 tablet (100 mg total) by mouth 2 (two) times daily. Take with or immediately following a meal. What changed: when to take this   Multivitamin Men 50+ Tabs Take 1 tablet by mouth in the morning and at bedtime.   omeprazole  20 MG capsule Commonly known as: PRILOSEC Take 1 capsule (20 mg total) by mouth daily. What changed: when to take this   polyethylene glycol 17 g packet Commonly known as: MIRALAX  / GLYCOLAX  Take 17 g by mouth daily as needed for moderate constipation.   potassium chloride  10 MEQ tablet Commonly known as: KLOR-CON  M Take 1 tablet (10 mEq total) by mouth daily. What changed: when to take this   pregabalin  50 MG capsule Commonly known as: LYRICA  Take 1 capsule (50 mg total) by mouth 2 (two) times daily. What changed:  medication strength how much to take   senna 8.6 MG Tabs tablet Commonly known as: SENOKOT Take  1 tablet by mouth in the morning and at bedtime.   Soliqua 100-33 UNT-MCG/ML Sopn Generic drug: Insulin  Glargine-Lixisenatide Inject 22 Units into the skin in the morning.   Vitamin D 50 MCG (2000 UT) Caps Take 2,000 Units by mouth in the morning.          Time coordinating discharge: 39 minutes  Signed:  Elior Robinette  [facility] 10/30/2023, 2:21 PM

## 2023-10-30 NOTE — TOC Transition Note (Addendum)
 Transition of Care [facility]) - Discharge Note   Patient Details  Name: Steve Jensen MRN: [id number] Date of Birth: [birth year]  [facility] CM/SW Contact:  Carmon Christen, LCSWA Phone Number: 10/30/2023, 2:34 PM   Clinical Narrative:     Patient will DC to: [facility]   Anticipated DC date: 10/30/2023  Family notified: [name]  Transport by: Lyna Sandhoff  ?  Per MD patient ready for DC to [facility] . RN, patient, patient's family, and facility notified of DC. [name] patients daughter confirmed she will bring patients Cpap to facility.Discharge Summary sent to facility. RN given number for report [phone] RM# 35. DC packet on chart. DNR signed by MD attached to patients DC packet.Ambulance transport requested for patient.  CSW signing off.   Final next level of care: Skilled Nursing Facility Barriers to Discharge: No Barriers Identified   Patient Goals and CMS Choice     Choice offered to / list presented to : Adult Children (Patients daughter [name])      Discharge Placement              Patient chooses bed at: [facility] Patient to be transferred to facility by: [facility] Name of family member notified: [name] Patient and family notified of of transfer: 10/30/23  Discharge Plan and Services Additional resources added to the After Visit Summary for                                       Social Drivers of Health (SDOH) Interventions SDOH Screenings   Food Insecurity: No Food Insecurity (10/25/2023)  Housing: Low Risk  (10/25/2023)  Transportation Needs: No Transportation Needs (10/25/2023)  Utilities: Not At Risk (10/25/2023)  Social Connections: Socially Isolated (10/27/2023)  Tobacco Use: Medium Risk (10/24/2023)     Readmission Risk Interventions    12/26/2021   12:51 PM 12/25/2021    3:31 PM 12/19/2021   12:39 PM  Readmission Risk Prevention Plan  Transportation Screening  Complete Complete  PCP or Specialist Appt within 5-7 Days  Complete    Home Care Screening  Complete   Medication Review (RN CM)  Complete   HRI or Home Care Consult   Complete  Social Work Consult for Recovery Care Planning/Counseling   Complete  Palliative Care Screening   Not Applicable  Medication Review Oceanographer)   Complete

## 2023-10-30 NOTE — Progress Notes (Signed)
 Occupational Therapy Treatment Patient Details Name: Steve Jensen MRN: [id number] DOB: 1941/01/19 Today's Date: 10/30/2023   History of present illness 83  y.o. male [facility] and found to be in a-fib RVR. Pt also with fall last Wednesday, head CT negative and MRI pending. AMS w/ suspected sepsis. Lumbar spine CT showed acute compression of L4 fx w/ 3 mm retropulsion. Prior admit 4/10 due to fall. PMH: a-fib, HTN, chronic HF-EF, OSA on CPAP, DM II, CKD III, GERD, hepatocellular carcinoma, prostate cancer.   OT comments  Pt making slow progress with functional goals. Pt's daughter in law to see him during session. Pt requires redirection, slow to respond to questions, demos decreased activity tolerance, impaired cognition (also difficulty expressing himself), back pain affecting funcitonal level. Pt Asim A rolling in bed using bed rails, total A to  flex L knee for log roll technique to roll to R side, reports L knee pain. Extra time to intiate and complete. OT will continue to follow acutely to maximize level of function and safety       If plan is discharge home, recommend the following:  A lot of help with bathing/dressing/bathroom;Assistance with cooking/housework;Direct supervision/assist for medications management;Direct supervision/assist for financial management;Assist for transportation;Help with stairs or ramp for entrance;Two people to help with walking and/or transfers   Equipment Recommendations  Other (comment) (defer)    Recommendations for Other Services      Precautions / Restrictions Precautions Precautions: Back;Fall Precaution Booklet Issued: Yes (comment) Recall of Precautions/Restrictions: Impaired Precaution/Restrictions Comments: watch HR Required Braces or Orthoses: Spinal Brace Spinal Brace: Lumbar corset Restrictions Weight Bearing Restrictions Per Provider Order: No       Mobility Bed  Mobility Overal bed mobility: Needs Assistance Bed Mobility: Rolling Rolling: Toron assist, +2 for physical assistance   Supine to sit: Dammon assist, HOB elevated, Used rails, +2 for physical assistance Sit to supine: +2 for physical assistance, Curlee assist   General bed mobility comments: pt using bed rails, assist to flex L knee for log roll technique to R side, reports L knee pain. Extra time to intiate and complete    Transfers                   General transfer comment: deferred due to slowed response to commands and increased effort required     Balance Overall balance assessment: History of Falls Sitting-balance support: Bilateral upper extremity supported Sitting balance-Leahy Scale: Poor Sitting balance - Comments: Caige/mod A for support/balance. Tolerated less than 1 minute Postural control: Posterior lean                                 ADL either performed or assessed with clinical judgement   ADL Overall ADL's : Needs assistance/impaired     Grooming: Wash/dry face;Wash/dry hands;Minimal assistance   Upper Body Bathing: Minimal assistance       Upper Body Dressing : Minimal assistance                     General ADL Comments: Pt with decreased activity tolerance, impaired cognition, back pain affecting funcitonal level    Extremity/Trunk Assessment Upper Extremity Assessment Upper Extremity Assessment: Generalized weakness;Right hand dominant   Lower Extremity Assessment Lower Extremity Assessment: Defer to PT evaluation        Vision Baseline Vision/History: 1 Wears glasses Ability to See  in Adequate Light: 0 Adequate Patient Visual Report: No change from baseline     Perception     Praxis     Communication Communication Communication: Impaired Factors Affecting Communication: Difficulty expressing self   Cognition Arousal: Alert Behavior During Therapy: Flat affect               OT - Cognition Comments:  requires redirection, slow to respond to questions                 Following commands: Impaired Following commands impaired: Follows one step commands inconsistently, Follows one step commands with increased time      Cueing   Cueing Techniques: Verbal cues, Tactile cues, Visual cues, Gestural cues  Exercises      Shoulder Instructions       General Comments      Pertinent Vitals/ Pain       Pain Assessment Pain Assessment: Faces Faces Pain Scale: Hurts even more Pain Location: low back, L knee during log rolling Pain Descriptors / Indicators: Aching, Discomfort Pain Intervention(s): Limited activity within patient's tolerance, Monitored during session, Repositioned  Home Living                                          Prior Functioning/Environment              Frequency  Min 2X/week        Progress Toward Goals  OT Goals(current goals can now be found in the care plan section)  Progress towards OT goals: OT to reassess next treatment     Plan      Co-evaluation                 AM-PAC OT "6 Clicks" Daily Activity     Outcome Measure   Help from another person eating meals?: A Little Help from another person taking care of personal grooming?: A Little Help from another person toileting, which includes using toliet, bedpan, or urinal?: Total Help from another person bathing (including washing, rinsing, drying)?: A Lot Help from another person to put on and taking off regular upper body clothing?: A Little Help from another person to put on and taking off regular lower body clothing?: Total 6 Click Score: 13    End of Session Equipment Utilized During Treatment: Other (comment) (TLSO)  OT Visit Diagnosis: Other abnormalities of gait and mobility (R26.89);Muscle weakness (generalized) (M62.81);Repeated falls (R29.6);History of falling (Z91.81);Other symptoms and signs involving cognitive function   Activity Tolerance No  increased pain;Patient limited by fatigue   Patient Left in bed;with call bell/phone within reach;with bed alarm set;with family/visitor present   Nurse Communication Mobility status        Time: 1610-9604 OT Time Calculation (min): 29 min  Charges: OT General Charges $OT Visit: 1 Visit OT Treatments $Self Care/Home Management : 8-22 mins $Therapeutic Activity: 8-22 mins    Alfred Ann 10/30/2023, 12:42 PM

## 2023-10-31 DIAGNOSIS — D649 Anemia, unspecified: Secondary | ICD-10-CM | POA: Diagnosis not present

## 2023-10-31 DIAGNOSIS — E559 Vitamin D deficiency, unspecified: Secondary | ICD-10-CM | POA: Diagnosis not present

## 2023-10-31 DIAGNOSIS — Z794 Long term (current) use of insulin: Secondary | ICD-10-CM | POA: Diagnosis not present

## 2023-10-31 DIAGNOSIS — N179 Acute kidney failure, unspecified: Secondary | ICD-10-CM | POA: Diagnosis not present

## 2023-11-02 ENCOUNTER — Telehealth: Payer: Self-pay

## 2023-11-02 DIAGNOSIS — D72829 Elevated white blood cell count, unspecified: Secondary | ICD-10-CM | POA: Diagnosis not present

## 2023-11-02 DIAGNOSIS — R002 Palpitations: Secondary | ICD-10-CM

## 2023-11-02 DIAGNOSIS — I480 Paroxysmal atrial fibrillation: Secondary | ICD-10-CM

## 2023-11-02 DIAGNOSIS — I5032 Chronic diastolic (congestive) heart failure: Secondary | ICD-10-CM

## 2023-11-02 DIAGNOSIS — I10 Essential (primary) hypertension: Secondary | ICD-10-CM

## 2023-11-02 DIAGNOSIS — I5030 Unspecified diastolic (congestive) heart failure: Secondary | ICD-10-CM | POA: Diagnosis not present

## 2023-11-02 DIAGNOSIS — E785 Hyperlipidemia, unspecified: Secondary | ICD-10-CM | POA: Diagnosis not present

## 2023-11-02 DIAGNOSIS — R5381 Other malaise: Secondary | ICD-10-CM | POA: Diagnosis not present

## 2023-11-02 DIAGNOSIS — G8929 Other chronic pain: Secondary | ICD-10-CM | POA: Diagnosis not present

## 2023-11-02 DIAGNOSIS — I4891 Unspecified atrial fibrillation: Secondary | ICD-10-CM | POA: Diagnosis not present

## 2023-11-02 DIAGNOSIS — K5900 Constipation, unspecified: Secondary | ICD-10-CM | POA: Diagnosis not present

## 2023-11-02 DIAGNOSIS — G4733 Obstructive sleep apnea (adult) (pediatric): Secondary | ICD-10-CM

## 2023-11-02 DIAGNOSIS — N1831 Chronic kidney disease, stage 3a: Secondary | ICD-10-CM

## 2023-11-02 DIAGNOSIS — R55 Syncope and collapse: Secondary | ICD-10-CM

## 2023-11-02 DIAGNOSIS — I4811 Longstanding persistent atrial fibrillation: Secondary | ICD-10-CM

## 2023-11-02 DIAGNOSIS — N189 Chronic kidney disease, unspecified: Secondary | ICD-10-CM | POA: Diagnosis not present

## 2023-11-02 DIAGNOSIS — S32040D Wedge compression fracture of fourth lumbar vertebra, subsequent encounter for fracture with routine healing: Secondary | ICD-10-CM | POA: Diagnosis not present

## 2023-11-02 DIAGNOSIS — E876 Hypokalemia: Secondary | ICD-10-CM | POA: Diagnosis not present

## 2023-11-02 DIAGNOSIS — N179 Acute kidney failure, unspecified: Secondary | ICD-10-CM | POA: Diagnosis not present

## 2023-11-02 NOTE — Telephone Encounter (Signed)
-----   Message from Gaylyn Keas sent at 10/05/2023  1:45 PM EDT ----- Add chin strap and get a download in 4 weeks

## 2023-11-02 NOTE — Telephone Encounter (Signed)
 Order for Pearl Bottcher sent to [facility] today.

## 2023-11-03 DIAGNOSIS — S81801A Unspecified open wound, right lower leg, initial encounter: Secondary | ICD-10-CM | POA: Diagnosis not present

## 2023-11-03 DIAGNOSIS — L97521 Non-pressure chronic ulcer of other part of left foot limited to breakdown of skin: Secondary | ICD-10-CM | POA: Diagnosis not present

## 2023-11-03 DIAGNOSIS — N179 Acute kidney failure, unspecified: Secondary | ICD-10-CM | POA: Diagnosis not present

## 2023-11-04 DIAGNOSIS — N189 Chronic kidney disease, unspecified: Secondary | ICD-10-CM | POA: Diagnosis not present

## 2023-11-04 DIAGNOSIS — I10 Essential (primary) hypertension: Secondary | ICD-10-CM | POA: Diagnosis not present

## 2023-11-04 DIAGNOSIS — R400 Somnolence: Secondary | ICD-10-CM | POA: Diagnosis not present

## 2023-11-04 DIAGNOSIS — E876 Hypokalemia: Secondary | ICD-10-CM | POA: Diagnosis not present

## 2023-11-04 DIAGNOSIS — D72829 Elevated white blood cell count, unspecified: Secondary | ICD-10-CM | POA: Diagnosis not present

## 2023-11-04 DIAGNOSIS — K5900 Constipation, unspecified: Secondary | ICD-10-CM | POA: Diagnosis not present

## 2023-11-04 DIAGNOSIS — L219 Seborrheic dermatitis, unspecified: Secondary | ICD-10-CM | POA: Diagnosis not present

## 2023-11-04 DIAGNOSIS — R5381 Other malaise: Secondary | ICD-10-CM | POA: Diagnosis not present

## 2023-11-04 DIAGNOSIS — G4733 Obstructive sleep apnea (adult) (pediatric): Secondary | ICD-10-CM | POA: Diagnosis not present

## 2023-11-04 DIAGNOSIS — I5030 Unspecified diastolic (congestive) heart failure: Secondary | ICD-10-CM | POA: Diagnosis not present

## 2023-11-04 DIAGNOSIS — E119 Type 2 diabetes mellitus without complications: Secondary | ICD-10-CM | POA: Diagnosis not present

## 2023-11-04 DIAGNOSIS — I4891 Unspecified atrial fibrillation: Secondary | ICD-10-CM | POA: Diagnosis not present

## 2023-11-06 DIAGNOSIS — I10 Essential (primary) hypertension: Secondary | ICD-10-CM | POA: Diagnosis not present

## 2023-11-08 ENCOUNTER — Inpatient Hospital Stay (HOSPITAL_COMMUNITY)
Admission: EM | Admit: 2023-11-08 | Discharge: 2023-11-17 | DRG: 871 | Disposition: A | Discharge status: Skilled Nursing Facility | Source: Skilled Nursing Facility | Attending: Internal Medicine | Admitting: Internal Medicine

## 2023-11-08 DIAGNOSIS — Z8505 Personal history of malignant neoplasm of liver: Secondary | ICD-10-CM

## 2023-11-08 DIAGNOSIS — R112 Nausea with vomiting, unspecified: Secondary | ICD-10-CM | POA: Diagnosis present

## 2023-11-08 DIAGNOSIS — I959 Hypotension, unspecified: Secondary | ICD-10-CM | POA: Diagnosis present

## 2023-11-08 DIAGNOSIS — Z8546 Personal history of malignant neoplasm of prostate: Secondary | ICD-10-CM

## 2023-11-08 DIAGNOSIS — A0472 Enterocolitis due to Clostridium difficile, not specified as recurrent: Secondary | ICD-10-CM | POA: Diagnosis not present

## 2023-11-08 DIAGNOSIS — E785 Hyperlipidemia, unspecified: Secondary | ICD-10-CM | POA: Diagnosis present

## 2023-11-08 DIAGNOSIS — Z743 Need for continuous supervision: Secondary | ICD-10-CM | POA: Diagnosis not present

## 2023-11-08 DIAGNOSIS — Z794 Long term (current) use of insulin: Secondary | ICD-10-CM

## 2023-11-08 DIAGNOSIS — Z8042 Family history of malignant neoplasm of prostate: Secondary | ICD-10-CM

## 2023-11-08 DIAGNOSIS — I130 Hypertensive heart and chronic kidney disease with heart failure and stage 1 through stage 4 chronic kidney disease, or unspecified chronic kidney disease: Secondary | ICD-10-CM | POA: Diagnosis not present

## 2023-11-08 DIAGNOSIS — N183 Chronic kidney disease, stage 3 unspecified: Secondary | ICD-10-CM | POA: Diagnosis present

## 2023-11-08 DIAGNOSIS — A4189 Other specified sepsis: Secondary | ICD-10-CM | POA: Diagnosis not present

## 2023-11-08 DIAGNOSIS — R6520 Severe sepsis without septic shock: Secondary | ICD-10-CM | POA: Diagnosis not present

## 2023-11-08 DIAGNOSIS — L270 Generalized skin eruption due to drugs and medicaments taken internally: Secondary | ICD-10-CM | POA: Diagnosis not present

## 2023-11-08 DIAGNOSIS — I499 Cardiac arrhythmia, unspecified: Secondary | ICD-10-CM | POA: Diagnosis not present

## 2023-11-08 DIAGNOSIS — E876 Hypokalemia: Secondary | ICD-10-CM | POA: Diagnosis present

## 2023-11-08 DIAGNOSIS — S32000A Wedge compression fracture of unspecified lumbar vertebra, initial encounter for closed fracture: Secondary | ICD-10-CM | POA: Diagnosis present

## 2023-11-08 DIAGNOSIS — E869 Volume depletion, unspecified: Secondary | ICD-10-CM | POA: Diagnosis not present

## 2023-11-08 DIAGNOSIS — R0689 Other abnormalities of breathing: Secondary | ICD-10-CM | POA: Diagnosis not present

## 2023-11-08 DIAGNOSIS — E8721 Acute metabolic acidosis: Secondary | ICD-10-CM | POA: Diagnosis not present

## 2023-11-08 DIAGNOSIS — D75839 Thrombocytosis, unspecified: Secondary | ICD-10-CM | POA: Diagnosis present

## 2023-11-08 DIAGNOSIS — Z7901 Long term (current) use of anticoagulants: Secondary | ICD-10-CM | POA: Diagnosis not present

## 2023-11-08 DIAGNOSIS — I5032 Chronic diastolic (congestive) heart failure: Secondary | ICD-10-CM | POA: Diagnosis present

## 2023-11-08 DIAGNOSIS — J9601 Acute respiratory failure with hypoxia: Secondary | ICD-10-CM | POA: Diagnosis present

## 2023-11-08 DIAGNOSIS — R29810 Facial weakness: Secondary | ICD-10-CM | POA: Diagnosis not present

## 2023-11-08 DIAGNOSIS — E872 Acidosis, unspecified: Principal | ICD-10-CM | POA: Diagnosis present

## 2023-11-08 DIAGNOSIS — Y92239 Unspecified place in hospital as the place of occurrence of the external cause: Secondary | ICD-10-CM | POA: Diagnosis not present

## 2023-11-08 DIAGNOSIS — Z823 Family history of stroke: Secondary | ICD-10-CM

## 2023-11-08 DIAGNOSIS — N1832 Chronic kidney disease, stage 3b: Secondary | ICD-10-CM | POA: Diagnosis not present

## 2023-11-08 DIAGNOSIS — G4733 Obstructive sleep apnea (adult) (pediatric): Secondary | ICD-10-CM | POA: Diagnosis present

## 2023-11-08 DIAGNOSIS — R6510 Systemic inflammatory response syndrome (SIRS) of non-infectious origin without acute organ dysfunction: Secondary | ICD-10-CM | POA: Diagnosis not present

## 2023-11-08 DIAGNOSIS — Z7984 Long term (current) use of oral hypoglycemic drugs: Secondary | ICD-10-CM

## 2023-11-08 DIAGNOSIS — R1110 Vomiting, unspecified: Secondary | ICD-10-CM | POA: Diagnosis not present

## 2023-11-08 DIAGNOSIS — Z8249 Family history of ischemic heart disease and other diseases of the circulatory system: Secondary | ICD-10-CM

## 2023-11-08 DIAGNOSIS — E1142 Type 2 diabetes mellitus with diabetic polyneuropathy: Secondary | ICD-10-CM | POA: Diagnosis not present

## 2023-11-08 DIAGNOSIS — Z6841 Body Mass Index (BMI) 40.0 and over, adult: Secondary | ICD-10-CM

## 2023-11-08 DIAGNOSIS — Z66 Do not resuscitate: Secondary | ICD-10-CM | POA: Diagnosis present

## 2023-11-08 DIAGNOSIS — R6889 Other general symptoms and signs: Secondary | ICD-10-CM | POA: Diagnosis not present

## 2023-11-08 DIAGNOSIS — M4856XA Collapsed vertebra, not elsewhere classified, lumbar region, initial encounter for fracture: Secondary | ICD-10-CM | POA: Diagnosis not present

## 2023-11-08 DIAGNOSIS — A419 Sepsis, unspecified organism: Secondary | ICD-10-CM | POA: Diagnosis not present

## 2023-11-08 DIAGNOSIS — A414 Sepsis due to anaerobes: Secondary | ICD-10-CM | POA: Diagnosis not present

## 2023-11-08 DIAGNOSIS — M109 Gout, unspecified: Secondary | ICD-10-CM | POA: Diagnosis present

## 2023-11-08 DIAGNOSIS — Z8052 Family history of malignant neoplasm of bladder: Secondary | ICD-10-CM

## 2023-11-08 DIAGNOSIS — Z833 Family history of diabetes mellitus: Secondary | ICD-10-CM

## 2023-11-08 DIAGNOSIS — Z888 Allergy status to other drugs, medicaments and biological substances status: Secondary | ICD-10-CM

## 2023-11-08 DIAGNOSIS — G9341 Metabolic encephalopathy: Secondary | ICD-10-CM | POA: Diagnosis not present

## 2023-11-08 DIAGNOSIS — Z79899 Other long term (current) drug therapy: Secondary | ICD-10-CM

## 2023-11-08 DIAGNOSIS — T50995A Adverse effect of other drugs, medicaments and biological substances, initial encounter: Secondary | ICD-10-CM | POA: Diagnosis not present

## 2023-11-08 DIAGNOSIS — E119 Type 2 diabetes mellitus without complications: Secondary | ICD-10-CM

## 2023-11-08 DIAGNOSIS — S32040A Wedge compression fracture of fourth lumbar vertebra, initial encounter for closed fracture: Secondary | ICD-10-CM | POA: Diagnosis not present

## 2023-11-08 DIAGNOSIS — I480 Paroxysmal atrial fibrillation: Secondary | ICD-10-CM | POA: Diagnosis not present

## 2023-11-08 DIAGNOSIS — Z87891 Personal history of nicotine dependence: Secondary | ICD-10-CM

## 2023-11-08 DIAGNOSIS — E1122 Type 2 diabetes mellitus with diabetic chronic kidney disease: Secondary | ICD-10-CM | POA: Diagnosis not present

## 2023-11-08 DIAGNOSIS — K219 Gastro-esophageal reflux disease without esophagitis: Secondary | ICD-10-CM | POA: Diagnosis present

## 2023-11-08 DIAGNOSIS — I10 Essential (primary) hypertension: Secondary | ICD-10-CM | POA: Diagnosis not present

## 2023-11-09 ENCOUNTER — Other Ambulatory Visit: Payer: Self-pay

## 2023-11-09 ENCOUNTER — Encounter (HOSPITAL_COMMUNITY): Payer: Self-pay

## 2023-11-09 ENCOUNTER — Emergency Department (HOSPITAL_COMMUNITY)

## 2023-11-09 DIAGNOSIS — N1831 Chronic kidney disease, stage 3a: Secondary | ICD-10-CM | POA: Diagnosis not present

## 2023-11-09 DIAGNOSIS — S32040A Wedge compression fracture of fourth lumbar vertebra, initial encounter for closed fracture: Secondary | ICD-10-CM

## 2023-11-09 DIAGNOSIS — Z7901 Long term (current) use of anticoagulants: Secondary | ICD-10-CM | POA: Diagnosis not present

## 2023-11-09 DIAGNOSIS — I482 Chronic atrial fibrillation, unspecified: Secondary | ICD-10-CM | POA: Diagnosis not present

## 2023-11-09 DIAGNOSIS — I130 Hypertensive heart and chronic kidney disease with heart failure and stage 1 through stage 4 chronic kidney disease, or unspecified chronic kidney disease: Secondary | ICD-10-CM | POA: Diagnosis not present

## 2023-11-09 DIAGNOSIS — M109 Gout, unspecified: Secondary | ICD-10-CM | POA: Diagnosis present

## 2023-11-09 DIAGNOSIS — R278 Other lack of coordination: Secondary | ICD-10-CM | POA: Diagnosis not present

## 2023-11-09 DIAGNOSIS — G4733 Obstructive sleep apnea (adult) (pediatric): Secondary | ICD-10-CM

## 2023-11-09 DIAGNOSIS — D3002 Benign neoplasm of left kidney: Secondary | ICD-10-CM | POA: Diagnosis not present

## 2023-11-09 DIAGNOSIS — E872 Acidosis, unspecified: Principal | ICD-10-CM | POA: Diagnosis present

## 2023-11-09 DIAGNOSIS — R0902 Hypoxemia: Secondary | ICD-10-CM | POA: Diagnosis not present

## 2023-11-09 DIAGNOSIS — A414 Sepsis due to anaerobes: Secondary | ICD-10-CM | POA: Diagnosis present

## 2023-11-09 DIAGNOSIS — E1142 Type 2 diabetes mellitus with diabetic polyneuropathy: Secondary | ICD-10-CM

## 2023-11-09 DIAGNOSIS — Z9181 History of falling: Secondary | ICD-10-CM | POA: Diagnosis not present

## 2023-11-09 DIAGNOSIS — K7460 Unspecified cirrhosis of liver: Secondary | ICD-10-CM | POA: Diagnosis not present

## 2023-11-09 DIAGNOSIS — I480 Paroxysmal atrial fibrillation: Secondary | ICD-10-CM

## 2023-11-09 DIAGNOSIS — M4856XA Collapsed vertebra, not elsewhere classified, lumbar region, initial encounter for fracture: Secondary | ICD-10-CM | POA: Diagnosis present

## 2023-11-09 DIAGNOSIS — J9601 Acute respiratory failure with hypoxia: Secondary | ICD-10-CM | POA: Diagnosis present

## 2023-11-09 DIAGNOSIS — A419 Sepsis, unspecified organism: Secondary | ICD-10-CM | POA: Diagnosis not present

## 2023-11-09 DIAGNOSIS — E8721 Acute metabolic acidosis: Secondary | ICD-10-CM | POA: Diagnosis not present

## 2023-11-09 DIAGNOSIS — R1314 Dysphagia, pharyngoesophageal phase: Secondary | ICD-10-CM | POA: Diagnosis not present

## 2023-11-09 DIAGNOSIS — K769 Liver disease, unspecified: Secondary | ICD-10-CM | POA: Diagnosis not present

## 2023-11-09 DIAGNOSIS — G9341 Metabolic encephalopathy: Secondary | ICD-10-CM | POA: Diagnosis present

## 2023-11-09 DIAGNOSIS — N1832 Chronic kidney disease, stage 3b: Secondary | ICD-10-CM | POA: Diagnosis present

## 2023-11-09 DIAGNOSIS — R6510 Systemic inflammatory response syndrome (SIRS) of non-infectious origin without acute organ dysfunction: Secondary | ICD-10-CM | POA: Diagnosis present

## 2023-11-09 DIAGNOSIS — A4189 Other specified sepsis: Secondary | ICD-10-CM | POA: Diagnosis present

## 2023-11-09 DIAGNOSIS — I959 Hypotension, unspecified: Secondary | ICD-10-CM | POA: Diagnosis present

## 2023-11-09 DIAGNOSIS — Z9981 Dependence on supplemental oxygen: Secondary | ICD-10-CM | POA: Diagnosis not present

## 2023-11-09 DIAGNOSIS — R112 Nausea with vomiting, unspecified: Secondary | ICD-10-CM | POA: Diagnosis not present

## 2023-11-09 DIAGNOSIS — I5032 Chronic diastolic (congestive) heart failure: Secondary | ICD-10-CM

## 2023-11-09 DIAGNOSIS — D75839 Thrombocytosis, unspecified: Secondary | ICD-10-CM | POA: Diagnosis present

## 2023-11-09 DIAGNOSIS — Z741 Need for assistance with personal care: Secondary | ICD-10-CM | POA: Diagnosis not present

## 2023-11-09 DIAGNOSIS — A0472 Enterocolitis due to Clostridium difficile, not specified as recurrent: Secondary | ICD-10-CM | POA: Diagnosis present

## 2023-11-09 DIAGNOSIS — Y92239 Unspecified place in hospital as the place of occurrence of the external cause: Secondary | ICD-10-CM | POA: Diagnosis not present

## 2023-11-09 DIAGNOSIS — M6281 Muscle weakness (generalized): Secondary | ICD-10-CM | POA: Diagnosis not present

## 2023-11-09 DIAGNOSIS — Z794 Long term (current) use of insulin: Secondary | ICD-10-CM | POA: Diagnosis not present

## 2023-11-09 DIAGNOSIS — E875 Hyperkalemia: Secondary | ICD-10-CM | POA: Diagnosis not present

## 2023-11-09 DIAGNOSIS — E785 Hyperlipidemia, unspecified: Secondary | ICD-10-CM | POA: Diagnosis present

## 2023-11-09 DIAGNOSIS — W19XXXD Unspecified fall, subsequent encounter: Secondary | ICD-10-CM | POA: Diagnosis not present

## 2023-11-09 DIAGNOSIS — R404 Transient alteration of awareness: Secondary | ICD-10-CM | POA: Diagnosis not present

## 2023-11-09 DIAGNOSIS — E876 Hypokalemia: Secondary | ICD-10-CM | POA: Diagnosis present

## 2023-11-09 DIAGNOSIS — L0390 Cellulitis, unspecified: Secondary | ICD-10-CM | POA: Diagnosis not present

## 2023-11-09 DIAGNOSIS — N183 Chronic kidney disease, stage 3 unspecified: Secondary | ICD-10-CM | POA: Diagnosis present

## 2023-11-09 DIAGNOSIS — R6520 Severe sepsis without septic shock: Secondary | ICD-10-CM | POA: Diagnosis present

## 2023-11-09 DIAGNOSIS — E1122 Type 2 diabetes mellitus with diabetic chronic kidney disease: Secondary | ICD-10-CM | POA: Diagnosis present

## 2023-11-09 DIAGNOSIS — Z7401 Bed confinement status: Secondary | ICD-10-CM | POA: Diagnosis not present

## 2023-11-09 DIAGNOSIS — Z6841 Body Mass Index (BMI) 40.0 and over, adult: Secondary | ICD-10-CM | POA: Diagnosis not present

## 2023-11-09 DIAGNOSIS — D3001 Benign neoplasm of right kidney: Secondary | ICD-10-CM | POA: Diagnosis not present

## 2023-11-09 DIAGNOSIS — N179 Acute kidney failure, unspecified: Secondary | ICD-10-CM | POA: Diagnosis not present

## 2023-11-09 DIAGNOSIS — K5909 Other constipation: Secondary | ICD-10-CM | POA: Diagnosis not present

## 2023-11-09 DIAGNOSIS — R55 Syncope and collapse: Secondary | ICD-10-CM | POA: Diagnosis not present

## 2023-11-09 DIAGNOSIS — Z743 Need for continuous supervision: Secondary | ICD-10-CM | POA: Diagnosis not present

## 2023-11-09 DIAGNOSIS — K567 Ileus, unspecified: Secondary | ICD-10-CM | POA: Diagnosis not present

## 2023-11-09 DIAGNOSIS — R2681 Unsteadiness on feet: Secondary | ICD-10-CM | POA: Diagnosis not present

## 2023-11-09 DIAGNOSIS — Z7984 Long term (current) use of oral hypoglycemic drugs: Secondary | ICD-10-CM | POA: Diagnosis not present

## 2023-11-09 DIAGNOSIS — I700 Atherosclerosis of aorta: Secondary | ICD-10-CM | POA: Diagnosis not present

## 2023-11-09 DIAGNOSIS — S32000A Wedge compression fracture of unspecified lumbar vertebra, initial encounter for closed fracture: Secondary | ICD-10-CM | POA: Diagnosis present

## 2023-11-09 DIAGNOSIS — Z66 Do not resuscitate: Secondary | ICD-10-CM | POA: Diagnosis present

## 2023-11-09 DIAGNOSIS — Z51A Encounter for sepsis aftercare: Secondary | ICD-10-CM | POA: Diagnosis not present

## 2023-11-09 LAB — CBC WITH DIFFERENTIAL/PLATELET
Abs Immature Granulocytes: 0.61 10*3/uL — ABNORMAL HIGH (ref 0.00–0.07)
Basophils Absolute: 0.2 10*3/uL — ABNORMAL HIGH (ref 0.0–0.1)
Basophils Relative: 1 %
Eosinophils Absolute: 0.1 10*3/uL (ref 0.0–0.5)
Eosinophils Relative: 1 %
HCT: 45.5 % (ref 39.0–52.0)
Hemoglobin: 15.1 g/dL (ref 13.0–17.0)
Immature Granulocytes: 2 %
Lymphocytes Relative: 5 %
Lymphs Abs: 1.4 10*3/uL (ref 0.7–4.0)
MCH: 27.3 pg (ref 26.0–34.0)
MCHC: 33.2 g/dL (ref 30.0–36.0)
MCV: 82.1 fL (ref 80.0–100.0)
Monocytes Absolute: 2.4 10*3/uL — ABNORMAL HIGH (ref 0.1–1.0)
Monocytes Relative: 8 %
Neutro Abs: 25.6 10*3/uL — ABNORMAL HIGH (ref 1.7–7.7)
Neutrophils Relative %: 83 %
Platelets: 595 10*3/uL — ABNORMAL HIGH (ref 150–400)
RBC: 5.54 MIL/uL (ref 4.22–5.81)
RDW: 18.1 % — ABNORMAL HIGH (ref 11.5–15.5)
WBC: 30.3 10*3/uL — ABNORMAL HIGH (ref 4.0–10.5)
nRBC: 0 % (ref 0.0–0.2)

## 2023-11-09 LAB — URINALYSIS, W/ REFLEX TO CULTURE (INFECTION SUSPECTED)
Bilirubin Urine: NEGATIVE
Glucose, UA: NEGATIVE mg/dL
Hgb urine dipstick: NEGATIVE
Ketones, ur: NEGATIVE mg/dL
Leukocytes,Ua: NEGATIVE
Nitrite: NEGATIVE
Protein, ur: NEGATIVE mg/dL
Specific Gravity, Urine: 1.017 (ref 1.005–1.030)
pH: 5 (ref 5.0–8.0)

## 2023-11-09 LAB — I-STAT CG4 LACTIC ACID, ED
Lactic Acid, Venous: 3.7 mmol/L (ref 0.5–1.9)
Lactic Acid, Venous: 4.4 mmol/L (ref 0.5–1.9)
Lactic Acid, Venous: 2.6 mmol/L (ref 0.5–1.9)

## 2023-11-09 LAB — COMPREHENSIVE METABOLIC PANEL WITH GFR
ALT: 18 U/L (ref 0–44)
AST: 20 U/L (ref 15–41)
Albumin: 2.8 g/dL — ABNORMAL LOW (ref 3.5–5.0)
Alkaline Phosphatase: 149 U/L — ABNORMAL HIGH (ref 38–126)
Anion gap: 16 — ABNORMAL HIGH (ref 5–15)
BUN: 32 mg/dL — ABNORMAL HIGH (ref 8–23)
CO2: 20 mmol/L — ABNORMAL LOW (ref 22–32)
Calcium: 8.6 mg/dL — ABNORMAL LOW (ref 8.9–10.3)
Chloride: 103 mmol/L (ref 98–111)
Creatinine, Ser: 1.8 mg/dL — ABNORMAL HIGH (ref 0.61–1.24)
GFR, Estimated: 37 mL/min — ABNORMAL LOW (ref >60)
Glucose, Bld: 191 mg/dL — ABNORMAL HIGH (ref 70–99)
Potassium: 2.7 mmol/L — CL (ref 3.5–5.1)
Sodium: 139 mmol/L (ref 135–145)
Total Bilirubin: 0.6 mg/dL (ref 0.0–1.2)
Total Protein: 5.9 g/dL — ABNORMAL LOW (ref 6.5–8.1)

## 2023-11-09 LAB — PROTIME-INR
INR: 1.2 (ref 0.8–1.2)
Prothrombin Time: 15.8 s — ABNORMAL HIGH (ref 11.4–15.2)

## 2023-11-09 LAB — PROCALCITONIN: Procalcitonin: 0.16 ng/mL

## 2023-11-09 LAB — CBG MONITORING, ED: Glucose-Capillary: 119 mg/dL — ABNORMAL HIGH (ref 70–99)

## 2023-11-09 LAB — BRAIN NATRIURETIC PEPTIDE: B Natriuretic Peptide: 187.8 pg/mL — ABNORMAL HIGH (ref 0.0–100.0)

## 2023-11-09 LAB — LACTIC ACID, PLASMA
Lactic Acid, Venous: 2.4 mmol/L (ref 0.5–1.9)
Lactic Acid, Venous: 1.8 mmol/L (ref 0.5–1.9)

## 2023-11-09 MED ORDER — SENNA 8.6 MG PO TABS
1.0000 | ORAL_TABLET | Freq: Every day | ORAL | Status: DC
  Administered 2023-11-09: 8.6 mg via ORAL
  Filled 2023-11-09: qty 1

## 2023-11-09 MED ORDER — SODIUM CHLORIDE 0.9% FLUSH
3.0000 mL | Freq: Two times a day (BID) | INTRAVENOUS | Status: DC
  Administered 2023-11-09 – 2023-11-17 (×16): 3 mL via INTRAVENOUS

## 2023-11-09 MED ORDER — ENOXAPARIN SODIUM 40 MG/0.4ML IJ SOSY: 40.0000 mg | PREFILLED_SYRINGE | INTRAMUSCULAR | Status: DC

## 2023-11-09 MED ORDER — ALBUTEROL SULFATE (2.5 MG/3ML) 0.083% IN NEBU: 2.5000 mg | INHALATION_SOLUTION | Freq: Four times a day (QID) | RESPIRATORY_TRACT | Status: DC | PRN

## 2023-11-09 MED ORDER — LACTATED RINGERS IV BOLUS (SEPSIS)
1000.0000 mL | Freq: Once | INTRAVENOUS | Status: AC
  Administered 2023-11-09: 1000 mL via INTRAVENOUS

## 2023-11-09 MED ORDER — POTASSIUM CHLORIDE CRYS ER 20 MEQ PO TBCR
40.0000 meq | EXTENDED_RELEASE_TABLET | Freq: Once | ORAL | Status: AC
  Administered 2023-11-09: 40 meq via ORAL
  Filled 2023-11-09: qty 2

## 2023-11-09 MED ORDER — APIXABAN 2.5 MG PO TABS
2.5000 mg | ORAL_TABLET | Freq: Two times a day (BID) | ORAL | Status: DC
  Administered 2023-11-09 – 2023-11-17 (×17): 2.5 mg via ORAL
  Filled 2023-11-09 (×17): qty 1

## 2023-11-09 MED ORDER — SODIUM CHLORIDE 0.9 % IV BOLUS
250.0000 mL | Freq: Once | INTRAVENOUS | Status: AC
  Administered 2023-11-09: 250 mL via INTRAVENOUS

## 2023-11-09 MED ORDER — LACTATED RINGERS IV BOLUS (SEPSIS)
1000.0000 mL | Freq: Once | INTRAVENOUS | Status: AC
Start: 2023-11-09 — End: 2023-11-09
  Administered 2023-11-09: 1000 mL via INTRAVENOUS

## 2023-11-09 MED ORDER — INSULIN ASPART 100 UNIT/ML IJ SOLN
0.0000 [IU] | Freq: Three times a day (TID) | INTRAMUSCULAR | Status: DC
  Administered 2023-11-12 (×2): 1 [IU] via SUBCUTANEOUS
  Administered 2023-11-13: 2 [IU] via SUBCUTANEOUS
  Administered 2023-11-14: 1 [IU] via SUBCUTANEOUS
  Administered 2023-11-14 – 2023-11-15 (×3): 2 [IU] via SUBCUTANEOUS

## 2023-11-09 MED ORDER — MIDODRINE HCL 5 MG PO TABS
5.0000 mg | ORAL_TABLET | Freq: Three times a day (TID) | ORAL | Status: DC
  Administered 2023-11-09 – 2023-11-11 (×6): 5 mg via ORAL
  Filled 2023-11-09 (×8): qty 1

## 2023-11-09 MED ORDER — SODIUM CHLORIDE 0.9 % IV SOLN
2.0000 g | Freq: Two times a day (BID) | INTRAVENOUS | Status: DC
  Administered 2023-11-09 – 2023-11-10 (×3): 2 g via INTRAVENOUS
  Filled 2023-11-09 (×3): qty 12.5

## 2023-11-09 MED ORDER — POTASSIUM CHLORIDE 10 MEQ/100ML IV SOLN
10.0000 meq | INTRAVENOUS | Status: AC
  Administered 2023-11-09 (×4): 10 meq via INTRAVENOUS
  Filled 2023-11-09 (×2): qty 100

## 2023-11-09 MED ORDER — TRAZODONE HCL 50 MG PO TABS
25.0000 mg | ORAL_TABLET | Freq: Every day | ORAL | Status: DC
  Administered 2023-11-09 – 2023-11-16 (×8): 25 mg via ORAL
  Filled 2023-11-09 (×8): qty 1

## 2023-11-09 MED ORDER — SODIUM CHLORIDE 0.9 % IV SOLN: INTRAVENOUS | Status: DC

## 2023-11-09 MED ORDER — FLUOXETINE HCL 10 MG PO CAPS
10.0000 mg | ORAL_CAPSULE | Freq: Every evening | ORAL | Status: DC
  Administered 2023-11-09 – 2023-11-16 (×8): 10 mg via ORAL
  Filled 2023-11-09 (×10): qty 1

## 2023-11-09 MED ORDER — VANCOMYCIN HCL IN DEXTROSE 1-5 GM/200ML-% IV SOLN
1000.0000 mg | INTRAVENOUS | Status: DC
  Administered 2023-11-10: 1000 mg via INTRAVENOUS
  Filled 2023-11-09 (×2): qty 200

## 2023-11-09 MED ORDER — ACETAMINOPHEN 325 MG PO TABS: 650.0000 mg | ORAL_TABLET | Freq: Four times a day (QID) | ORAL | Status: DC | PRN

## 2023-11-09 MED ORDER — VANCOMYCIN HCL 2000 MG/400ML IV SOLN
2000.0000 mg | Freq: Once | INTRAVENOUS | Status: AC
  Administered 2023-11-09: 2000 mg via INTRAVENOUS
  Filled 2023-11-09: qty 400

## 2023-11-09 MED ORDER — PREGABALIN 25 MG PO CAPS
50.0000 mg | ORAL_CAPSULE | Freq: Two times a day (BID) | ORAL | Status: DC
  Administered 2023-11-09 – 2023-11-17 (×16): 50 mg via ORAL
  Filled 2023-11-09 (×16): qty 2

## 2023-11-09 MED ORDER — ALLOPURINOL 100 MG PO TABS
100.0000 mg | ORAL_TABLET | Freq: Every morning | ORAL | Status: DC
  Administered 2023-11-09 – 2023-11-17 (×9): 100 mg via ORAL
  Filled 2023-11-09 (×9): qty 1

## 2023-11-09 MED ORDER — PANTOPRAZOLE SODIUM 40 MG PO TBEC
40.0000 mg | DELAYED_RELEASE_TABLET | Freq: Every day | ORAL | Status: DC
  Administered 2023-11-10 – 2023-11-17 (×8): 40 mg via ORAL
  Filled 2023-11-09 (×8): qty 1

## 2023-11-09 MED ORDER — VANCOMYCIN HCL IN DEXTROSE 1-5 GM/200ML-% IV SOLN: 1000.0000 mg | Freq: Once | INTRAVENOUS | Status: DC

## 2023-11-09 MED ORDER — DOCUSATE SODIUM 100 MG PO CAPS
100.0000 mg | ORAL_CAPSULE | Freq: Two times a day (BID) | ORAL | Status: DC
  Administered 2023-11-09 – 2023-11-10 (×2): 100 mg via ORAL
  Filled 2023-11-09 (×2): qty 1

## 2023-11-09 MED ORDER — ACETAMINOPHEN 650 MG RE SUPP: 650.0000 mg | Freq: Four times a day (QID) | RECTAL | Status: DC | PRN

## 2023-11-09 MED ORDER — CEFEPIME HCL 2 G IV SOLR
2.0000 g | Freq: Once | INTRAVENOUS | Status: AC
  Administered 2023-11-09: 2 g via INTRAVENOUS
  Filled 2023-11-09: qty 12.5

## 2023-11-09 MED ORDER — LACTATED RINGERS IV SOLN: INTRAVENOUS | Status: DC

## 2023-11-09 MED ORDER — METRONIDAZOLE 500 MG/100ML IV SOLN
500.0000 mg | Freq: Once | INTRAVENOUS | Status: AC
Start: 2023-11-09 — End: 2023-11-09
  Administered 2023-11-09: 500 mg via INTRAVENOUS
  Filled 2023-11-09: qty 100

## 2023-11-09 MED ORDER — EZETIMIBE 10 MG PO TABS
10.0000 mg | ORAL_TABLET | Freq: Every morning | ORAL | Status: DC
  Administered 2023-11-10 – 2023-11-17 (×8): 10 mg via ORAL
  Filled 2023-11-09 (×8): qty 1

## 2023-11-09 MED ORDER — KETOCONAZOLE 2 % EX CREA: 1.0000 | TOPICAL_CREAM | Freq: Two times a day (BID) | CUTANEOUS | Status: DC | PRN

## 2023-11-09 NOTE — H&P (Signed)
 History and Physical    Patient: Steve Jensen NWG:[id number] DOB: 1940/11/22 DOA: 11/08/2023 DOS: the patient was seen and examined on 11/09/2023 PCP: Glena Landau, MD  Patient coming from: Skilled nursing facility via EMS  Chief Complaint: Acute respiratory distress  HPI: Steve Jensen is a 83 y.o. male with medical history significant of PAF on Eliquis , hypertension, chronic HFpEF, OSA on CPAP, obesity, type 2 diabetes, CKD stage IIIa, GERD, hepatocellular carcinoma seen by IR, history of prostate cancer, falls who presents after having acute respiratory distress.  History is obtained from review of records as patient is confused currently and states that he is here due to a sign.  Records note patient was being laid flat to be changed at which point he had sudden onset of emesis.  After vomiting patient turned blue and appeared to be choking with difficulty breathing and possibly turned blue.  He was noted to have O2 saturations as low as 86% for which she was placed on 2 L of oxygen  and EMS called to transport to the hospital.  He had just recently been hospitalized 5/31-6/6 for altered mental status after having a fall due to weakness with reported low blood pressures.  Patient was noted not to have significant orthostatic hypotension when checked.  Patient was found to have a acute compression fracture of L4 for which patient was recommended LSO brace and outpatient follow-up.  Over the phone the patient's son makes note that they had just recently seen his father yesterday and he was doing well and seemed alert and oriented.  He does admit that his father has intermittently gotten confused following the falls during prior hospitalizations.  In the emergency department patient was noted to be afebrile with heart rates elevated up to 124 in atrial fibrillation, blood pressures noted to be as low as 88/53, and O2 saturations currently maintained on 2 L of nasal cannula oxygen .  Labs  significant for WBC 32.3, platelets 595, potassium 2.7, BUN 32, creatinine 1.8, calcium  8.6, anion gap 16, and lactic acid 3.7-> 4.4-> 2.6.  Chest x-ray that showed no acute abnormality.  Urinalysis did not show significant signs for infection. Blood cultures were obtained.  Patient was given 4 L lactated Ringer 's, potassium chloride  40 meq p.o., vancomycin , metronidazole, and cefepime .  Review of Systems: As mentioned in the history of present illness. All other systems reviewed and are negative. Past Medical History:  Diagnosis Date   Anemia    Diabetes mellitus type II, controlled (HCC)    with neuropathy   Dysrhythmia    A-Fib. cardioversion done   GERD (gastroesophageal reflux disease)    Hyperlipidemia    Hypertension    Morbid obesity (HCC)    Neuromuscular disorder (HCC)    feet   OSA (obstructive sleep apnea)    On BiPAP at 15/11cm H2O   Prostate cancer (HCC)    Shingles    on face Nov 2010   Urinary incontinence    Past Surgical History:  Procedure Laterality Date   CARDIOVERSION N/A 09/06/2013   Procedure: CARDIOVERSION;  Surgeon: Lucendia Rusk, MD;  Location: [facility] ENDOSCOPY;  Service: Cardiovascular;  Laterality: N/A;   EYE SURGERY     cataract surgery bilat; surgery to correct droopy eyelids    FLEXIBLE SIGMOIDOSCOPY N/A 12/24/2021   Procedure: FLEXIBLE SIGMOIDOSCOPY;  Surgeon: Urban Garden, MD;  Location: [facility] ENDO SUITE;  Service: Gastroenterology;  Laterality: N/A;   IR ANGIOGRAM SELECTIVE EACH ADDITIONAL VESSEL  02/15/2018   IR  ANGIOGRAM SELECTIVE EACH ADDITIONAL VESSEL  02/15/2018   IR ANGIOGRAM SELECTIVE EACH ADDITIONAL VESSEL  02/15/2018   IR ANGIOGRAM SELECTIVE EACH ADDITIONAL VESSEL  02/15/2018   IR ANGIOGRAM SELECTIVE EACH ADDITIONAL VESSEL  02/15/2018   IR ANGIOGRAM VISCERAL SELECTIVE  02/15/2018   IR ANGIOGRAM VISCERAL SELECTIVE  02/15/2018   IR EMBO TUMOR ORGAN ISCHEMIA INFARCT INC GUIDE ROADMAPPING  02/15/2018   IR RADIOLOGIST EVAL & MGMT   01/26/2018   IR RADIOLOGIST EVAL & MGMT  03/17/2018   IR RADIOLOGIST EVAL & MGMT  06/29/2018   IR RADIOLOGIST EVAL & MGMT  07/27/2018   IR RADIOLOGIST EVAL & MGMT  10/28/2018   IR RADIOLOGIST EVAL & MGMT  12/14/2018   IR RADIOLOGIST EVAL & MGMT  03/17/2019   IR RADIOLOGIST EVAL & MGMT  07/14/2019   IR RADIOLOGIST EVAL & MGMT  12/27/2019   IR RADIOLOGIST EVAL & MGMT  08/21/2020   IR RADIOLOGIST EVAL & MGMT  02/28/2021   IR RADIOLOGIST EVAL & MGMT  06/25/2021   IR RADIOLOGIST EVAL & MGMT  10/18/2021   IR US  GUIDE VASC ACCESS RIGHT  02/15/2018   knee surgery bilat      PROSTATE SURGERY     RADIOLOGY WITH ANESTHESIA N/A 11/24/2018   Procedure: MICROWAVE THERMAL ABLATION LIVER;  Surgeon: Robbi Childs, MD;  Location: [facility] ORS;  Service: Anesthesiology;  Laterality: N/A;   right shoulder rotator cuff surgery     TONSILLECTOMY     Social History:  reports that he quit smoking about 48 years ago. His smoking use included cigarettes. He started smoking about 63 years ago. He has a 15 pack-year smoking history. He has never used smokeless tobacco. He reports that he does not drink alcohol and does not use drugs.  Allergies  Allergen Reactions   Lipitor [Atorvastatin] Other (See Comments)    Myalgias   Zithromax [Azithromycin] Other (See Comments)    GI intolerance   Zocor [Simvastatin] Other (See Comments)    Myalgias    Norvasc  [Amlodipine ] Swelling    Family History  Problem Relation Age of Onset   Hypertension Brother    Diabetes Brother    Cancer Brother        bladder cancer   Diabetes Brother    CVA Brother    Hypertension Brother    Prostate cancer Brother    Diabetes Brother    Hypertension Brother    Heart disease Brother        CABG   Heart attack Brother    Stroke Brother     Prior to Admission medications   Medication Sig Start Date End Date Taking? Authorizing Provider  allopurinol  (ZYLOPRIM ) 100 MG tablet Take 100 mg by mouth in the morning. 04/17/21  Yes [provider]   apixaban  (ELIQUIS ) 2.5 MG TABS tablet Take 1 tablet (2.5 mg total) by mouth 2 (two) times daily. TAKE 1 TABLET(5 MG) BY MOUTH TWICE DAILY 10/30/23  Yes Pokhrel, Laxman, MD  Cholecalciferol (VITAMIN D) 50 MCG (2000 UT) CAPS Take 2,000 Units by mouth in the morning.   Yes [provider]  docusate sodium  (COLACE) 100 MG capsule Take 100 mg by mouth 2 (two) times daily.   Yes [provider]  ezetimibe  (ZETIA ) 10 MG tablet Take 10 mg by mouth in the morning.   Yes [provider]  ferrous sulfate 325 (65 FE) MG tablet Take 325 mg by mouth daily with breakfast.   Yes [provider]  FLUoxetine  (PROZAC ) 10  MG capsule Take 10 mg by mouth every evening. 04/14/23  Yes [provider]  guaiFENesin  (MUCINEX ) 600 MG 12 hr tablet Take 600 mg by mouth 2 (two) times daily as needed for cough or to loosen phlegm.   Yes [provider]  ketoconazole (NIZORAL) 2 % cream Apply 1 Application topically 2 (two) times daily. 11/04/23  Yes [provider]  Omega-3 Fatty Acids (FISH OIL) 1000 MG CAPS Take 1,000 mg by mouth 2 (two) times daily.   Yes [provider]  feeding supplement (ENSURE PLUS HIGH PROTEIN) LIQD Take 237 mLs by mouth 2 (two) times daily between meals. 10/30/23   Pokhrel, Laxman, MD  furosemide  (LASIX ) 40 MG tablet Take 40 mg by mouth in the morning.    [provider]  insulin  aspart (NOVOLOG ) 100 UNIT/ML injection Inject 0-10 Units into the skin 3 (three) times daily before meals. Sliding scale: if sugar is 150-200 = 2 units, if 201-250 = 4 units, if 251-300 = 6 units, if 301-350 = 8 units, if 351-400 = 10 units, give blood sugar is greater than 400 then call the MD.    [provider]  Insulin  Glargine-Lixisenatide (SOLIQUA) 100-33 UNT-MCG/ML SOPN Inject 22 Units into the skin in the morning.    [provider]  leptospermum manuka honey (MEDIHONEY) PSTE paste Apply 1 Application topically daily. Monday and  Thursday    [provider]  melatonin 5 MG TABS Take 5 mg by mouth at bedtime. 11/17/22   [provider]  metFORMIN  (GLUCOPHAGE ) 1000 MG tablet Take 1,000 mg by mouth 2 (two) times daily with a meal. 06/27/14   [provider]  metoprolol  succinate (TOPROL -XL) 100 MG 24 hr tablet Take 1 tablet (100 mg total) by mouth 2 (two) times daily. Take with or immediately following a meal. 10/30/23   Pokhrel, Laxman, MD  Multiple Vitamins-Minerals (MULTIVITAMIN MEN 50+) TABS Take 1 tablet by mouth in the morning and at bedtime.    [provider]  omeprazole  (PRILOSEC) 20 MG capsule Take 1 capsule (20 mg total) by mouth daily. Patient taking differently: Take 20 mg by mouth in the morning. 11/15/22   Faustino Hook L, MD  polyethylene glycol (MIRALAX  / GLYCOLAX ) 17 g packet Take 17 g by mouth daily as needed for moderate constipation.    [provider]  potassium chloride  SA (KLOR-CON  M) 10 MEQ tablet Take 1 tablet (10 mEq total) by mouth daily. Patient taking differently: Take 10 mEq by mouth in the morning. 09/05/23   Krishnan, Gokul, MD  pregabalin  (LYRICA ) 50 MG capsule Take 1 capsule (50 mg total) by mouth 2 (two) times daily. 10/30/23 10/29/24  Pokhrel, Laxman, MD  senna (SENOKOT) 8.6 MG TABS tablet Take 1 tablet by mouth in the morning and at bedtime.    [provider]    Physical Exam: Vitals:   11/09/23 0600 11/09/23 0715 11/09/23 0730 11/09/23 0818  BP: (!) 98/58 (!) 85/63 92/61 (!) 88/53  Pulse: (!) 108 97 (!) 105 (!) 103  Resp: 20 17 20 19   Temp: 97.9 F (36.6 C)   97.8 F (36.6 C)  TempSrc:    Oral  SpO2: 100% 98% 99% 99%  Weight:       Constitutional: Morbidly obese elderly male currently in acute distress Eyes: PERRL, lids and conjunctivae normal ENMT: Mucous membranes are moist.  Normal dentition.  Neck: normal, supple, no masses, no thyromegaly Respiratory: clear to auscultation bilaterally, no wheezing, no crackles. Normal  respiratory effort. No accessory muscle use.  Cardiovascular: Regular rate and rhythm, no murmurs / rubs / gallops. No extremity edema. 2+ pedal pulses. No carotid bruits.  Abdomen: no tenderness, no masses palpated. No hepatosplenomegaly. Bowel sounds positive.  Musculoskeletal: no clubbing / cyanosis. No joint deformity upper and lower extremities. Good ROM, no contractures. Normal muscle tone.  Skin: no rashes, lesions, ulcers. No induration Neurologic: CN 2-12 grossly intact. Sensation intact, DTR normal. Strength 5/5 in all 4.  Psychiatric: Alert and oriented to person and place but not necessarily circumstance.  Data Reviewed:  EKG revealed atrial fibrillation at 137 bpm .  Reviewed labs, imaging, and pertinent records as documented.  Assessment and Plan:  SIRS Lactic acidosis Acute patient was noted to be hypothermic with tachycardia and WBC elevated at 30.3.  Chest x-ray showed no acute abnormality.  Blood cultures have been obtained.  Lactic acid was noted to initially be elevated up to 4.4. - Admit to progressive bed - Follow-up blood cultures - Check procalcitonin and trend lactic acid level - Continue empiric antibiotics of vancomycin  and cefepime .  De-escalate when medically appropriate  Acute respiratory failure with hypoxemia Nausea and vomiting Patient reportedly had episodes of nausea and vomiting prior to arrival after being laid flat.  O2 saturations noted to be as low as 86% on EMS arrival was placed and was placed on 2 L nasal cannula oxygen .  Chest x-ray did not show any acute abnormality.  Question possibility of aspiration - Aspiration precautions with elevation head of the bed - Continue nasal cannula oxygen  to maintain O2 saturations - Continue empiric antibiotics.  De-escalate when medically appropriate  Hypotension Initial blood pressures noted to be as low as 96/51.  Patient was bolused with IV fluids with improvement in blood pressures - Goal maps greater  than 65 - Held blood pressure medication - Midodrine 5 mg 3 times daily  Acute metabolic encephalopathy Patient noted to be acutely altered and confused thinking that he drove to the hospital from home when he was at a skilled nursing facility prior to coming to the hospital. - Delirium precautions - Check ammonia level - Safety sitter  Paroxysmal atrial fibrillation on chronic anticoagulation Patient appears to be in atrial fibrillation and currently rate controlled - Continue Eliquis   Hypokalemia Acute.  Potassium 2.7.  Patient had been given 40 meq of potassium chloride  p.o. Suspect secondary to nausea and vomiting and/or Lasix  dose.. - Continue to monitor and replace as needed  Chronic kidney disease stage IIIb Creatinine noted to be 1.8 with BUN 32.  Baseline creatinine previously noted to be 1.3-1.4. Dose of Lasix  have been changed during last hospitalization.  Heart failure with preserved ejection fraction Patient does not appear grossly fluid overloaded on physical exam.  Last EF noted to be 55 to 60%.  During last hospitalization patient was changed from twice daily Lasix  to daily Lasix  and metoprolol  succinate changed to twice daily. - Initial Lasix  and metoprolol  held due to hypotension.  Reassess and determine when medically appropriate to resume  Uncontrolled diabetes mellitus type 2, with long-term use of insulin  Last available hemoglobin A1c was 7.5 when checked on 4/11. - Hypoglycemic protocols - Hold metformin  - CBGs before every meal with sensitive SSI - Adjust insulin  regimen as needed  Lumbar compression fracture Prior to arrival.  Noted during last hospitalization patient found to have an acute compression fracture of the L4 vertebral body on CT from 5/31. - Continue PT with LSO brace  Thrombocytosis Acute.  Platelet count 595.  Suspect reactive in nature. -Recheck CBC in AM.  History of gout - Continue allopurinol   Morbid obesity BMI 40.27  kg/m  OSA - Continue CPAP  DVT prophylaxis: Eliquis   Advance Care Planning:   Code Status: Limited: Do not attempt resuscitation (DNR) -DNR-LIMITED -Do Not Intubate/DNI    Consults: None  Family Communication: Son updated over the phone  Severity of Illness: The appropriate patient status for this patient is INPATIENT. Inpatient status is judged to be reasonable and necessary in order to provide the required intensity of service to ensure the patient's safety. The patient's presenting symptoms, physical exam findings, and initial radiographic and laboratory data in the context of their chronic comorbidities is felt to place them at high risk for further clinical deterioration. Furthermore, it is not anticipated that the patient will be medically stable for discharge from the hospital within 2 midnights of admission.   * I certify that at the point of admission it is my clinical judgment that the patient will require inpatient hospital care spanning beyond 2 midnights from the point of admission due to high intensity of service, high risk for further deterioration and high frequency of surveillance required.*  Author: Lena Qualia, MD 11/09/2023 8:32 AM  For on call review [email or web address].

## 2023-11-09 NOTE — Evaluation (Signed)
 Clinical/Bedside Swallow Evaluation Patient Details  Name: Steve Jensen MRN: [id number] Date of Birth: 01/13/1941  Today's Date: 11/09/2023 Time: SLP Start Time (ACUTE ONLY): 1242 SLP Stop Time (ACUTE ONLY): 1251 SLP Time Calculation (min) (ACUTE ONLY): 9 min  Past Medical History:  Past Medical History:  Diagnosis Date   Anemia    Diabetes mellitus type II, controlled (HCC)    with neuropathy   Dysrhythmia    A-Fib. cardioversion done   GERD (gastroesophageal reflux disease)    Hyperlipidemia    Hypertension    Morbid obesity (HCC)    Neuromuscular disorder (HCC)    feet   OSA (obstructive sleep apnea)    On BiPAP at 15/11cm H2O   Prostate cancer (HCC)    Shingles    on face Nov 2010   Urinary incontinence    Past Surgical History:  Past Surgical History:  Procedure Laterality Date   CARDIOVERSION N/A 09/06/2013   Procedure: CARDIOVERSION;  Surgeon: Lucendia Rusk, MD;  Location: [facility] ENDOSCOPY;  Service: Cardiovascular;  Laterality: N/A;   EYE SURGERY     cataract surgery bilat; surgery to correct droopy eyelids    FLEXIBLE SIGMOIDOSCOPY N/A 12/24/2021   Procedure: FLEXIBLE SIGMOIDOSCOPY;  Surgeon: Urban Garden, MD;  Location: [facility] ENDO SUITE;  Service: Gastroenterology;  Laterality: N/A;   IR ANGIOGRAM SELECTIVE EACH ADDITIONAL VESSEL  02/15/2018   IR ANGIOGRAM SELECTIVE EACH ADDITIONAL VESSEL  02/15/2018   IR ANGIOGRAM SELECTIVE EACH ADDITIONAL VESSEL  02/15/2018   IR ANGIOGRAM SELECTIVE EACH ADDITIONAL VESSEL  02/15/2018   IR ANGIOGRAM SELECTIVE EACH ADDITIONAL VESSEL  02/15/2018   IR ANGIOGRAM VISCERAL SELECTIVE  02/15/2018   IR ANGIOGRAM VISCERAL SELECTIVE  02/15/2018   IR EMBO TUMOR ORGAN ISCHEMIA INFARCT INC GUIDE ROADMAPPING  02/15/2018   IR RADIOLOGIST EVAL & MGMT  01/26/2018   IR RADIOLOGIST EVAL & MGMT  03/17/2018   IR RADIOLOGIST EVAL & MGMT  06/29/2018   IR RADIOLOGIST EVAL & MGMT  07/27/2018   IR RADIOLOGIST EVAL & MGMT  10/28/2018   IR RADIOLOGIST EVAL  & MGMT  12/14/2018   IR RADIOLOGIST EVAL & MGMT  03/17/2019   IR RADIOLOGIST EVAL & MGMT  07/14/2019   IR RADIOLOGIST EVAL & MGMT  12/27/2019   IR RADIOLOGIST EVAL & MGMT  08/21/2020   IR RADIOLOGIST EVAL & MGMT  02/28/2021   IR RADIOLOGIST EVAL & MGMT  06/25/2021   IR RADIOLOGIST EVAL & MGMT  10/18/2021   IR US  GUIDE VASC ACCESS RIGHT  02/15/2018   knee surgery bilat      PROSTATE SURGERY     RADIOLOGY WITH ANESTHESIA N/A 11/24/2018   Procedure: MICROWAVE THERMAL ABLATION LIVER;  Surgeon: Robbi Childs, MD;  Location: [facility] ORS;  Service: Anesthesiology;  Laterality: N/A;   right shoulder rotator cuff surgery     TONSILLECTOMY     HPI:  Steve Jensen is an 83 yo male presenting to ED after vomiting with concern for sepsis. Recently admitted 5/31-6/6 with A-fib and AMS after a recent fall. PMH includes HTN, A-fib, OSA on CPAP, T2DM, CKD III, GERD, hepatocellular carcinoma, prostate cancer    Assessment / Plan / Recommendation  Clinical Impression  Pt reports history of esophageal dysphagia with multiple dilations but otherwise denies difficulty swallowing. No signs clinically concerning for aspiration were observed during the 3 oz water test. Oral transit was efficient with complete clearance achieved given regular solids. Recommend regular diet with thin liquids and no further SLP f/u. Will  sign off. SLP Visit Diagnosis: Dysphagia, unspecified (R13.10)    Aspiration Risk  Mild aspiration risk    Diet Recommendation Regular;Thin liquid    Liquid Administration via: Cup;Straw Medication Administration: Whole meds with puree Supervision: Patient able to self feed Compensations: Minimize environmental distractions;Slow rate;Small sips/bites Postural Changes: Seated upright at 90 degrees;Remain upright for at least 30 minutes after po intake    Other  Recommendations Oral Care Recommendations: Oral care BID     Assistance Recommended at Discharge    Functional Status Assessment Patient has not  had a recent decline in their functional status  Frequency and Duration            Prognosis Prognosis for improved oropharyngeal function: Good Barriers to Reach Goals: Cognitive deficits      Swallow Study   General HPI: Steve Jensen is an 83 yo male presenting to ED after vomiting with concern for sepsis. Recently admitted 5/31-6/6 with A-fib and AMS after a recent fall. PMH includes HTN, A-fib, OSA on CPAP, T2DM, CKD III, GERD, hepatocellular carcinoma, prostate cancer Type of Study: Bedside Swallow Evaluation Previous Swallow Assessment: none in chart Diet Prior to this Study: NPO Temperature Spikes Noted: No Respiratory Status: Nasal cannula History of Recent Intubation: No Behavior/Cognition: Alert;Cooperative Oral Cavity Assessment: Within Functional Limits Oral Care Completed by SLP: No Oral Cavity - Dentition: Adequate natural dentition Vision: Functional for self-feeding Self-Feeding Abilities: Able to feed self Patient Positioning: Upright in bed Baseline Vocal Quality: Normal Volitional Cough: Strong Volitional Swallow: Able to elicit    Oral/Motor/Sensory Function Overall Oral Motor/Sensory Function: Within functional limits   Ice Chips Ice chips: Not tested   Thin Liquid Thin Liquid: Within functional limits Presentation: Straw;Self Fed    Nectar Thick Nectar Thick Liquid: Not tested   Honey Thick Honey Thick Liquid: Not tested   Puree Puree: Not tested   Solid     Solid: Within functional limits Presentation: Self Fed      Amil Kale, M.A., CCC-SLP Speech Language Pathology, Acute Rehabilitation Services  Secure Chat preferred [phone]  11/09/2023,12:59 PM

## 2023-11-09 NOTE — ED Notes (Signed)
 Md paged for update of vital signs, istat lactate drawn and given to lab, pt in bed, pt is slightly confused, pt oriented to person and place, reoriented pt to day of the week and year.  Pt denies pain.

## 2023-11-09 NOTE — ED Provider Notes (Signed)
 [place] EMERGENCY DEPARTMENT AT [facility] Provider Note   CSN: [id number] Arrival date & time: 11/08/23  2358     Patient presents with: No chief complaint on file.   Steve Jensen is a 83 y.o. male.  {Add pertinent medical, surgical, social history, OB history to UXL:24401} Brought to the ED from nursing home by EMS.  Nursing home staff reports that they laid him flat to change him at which point he had sudden emesis.  Afterwards vomiting, patient appeared to be choking and have difficulty breathing, may have turned blue.  He was placed on oxygen .  EMS report that the patient has been awake and alert during transport.       Prior to Admission medications   Medication Sig Start Date End Date Taking? Authorizing Provider  allopurinol  (ZYLOPRIM ) 100 MG tablet Take 100 mg by mouth in the morning. 04/17/21   [provider]  apixaban  (ELIQUIS ) 2.5 MG TABS tablet Take 1 tablet (2.5 mg total) by mouth 2 (two) times daily. TAKE 1 TABLET(5 MG) BY MOUTH TWICE DAILY 10/30/23   Pokhrel, Laxman, MD  Cholecalciferol (VITAMIN D) 50 MCG (2000 UT) CAPS Take 2,000 Units by mouth in the morning.    [provider]  docusate sodium  (COLACE) 100 MG capsule Take 100 mg by mouth 2 (two) times daily.    [provider]  ezetimibe  (ZETIA ) 10 MG tablet Take 10 mg by mouth in the morning.    [provider]  feeding supplement (ENSURE PLUS HIGH PROTEIN) LIQD Take 237 mLs by mouth 2 (two) times daily between meals. 10/30/23   Pokhrel, Laxman, MD  ferrous sulfate 325 (65 FE) MG tablet Take 325 mg by mouth daily with breakfast.    [provider]  FLUoxetine  (PROZAC ) 10 MG capsule Take 10 mg by mouth every evening. 04/14/23   [provider]  furosemide  (LASIX ) 40 MG tablet Take 40 mg by mouth in the morning.    [provider]  guaiFENesin  (MUCINEX ) 600 MG 12 hr tablet Take 600 mg by mouth 2 (two) times daily as needed for cough or to  loosen phlegm.    [provider]  insulin  aspart (NOVOLOG ) 100 UNIT/ML injection Inject 0-10 Units into the skin 3 (three) times daily before meals. Sliding scale: if sugar is 150-200 = 2 units, if 201-250 = 4 units, if 251-300 = 6 units, if 301-350 = 8 units, if 351-400 = 10 units, give blood sugar is greater than 400 then call the MD.    [provider]  Insulin  Glargine-Lixisenatide (SOLIQUA) 100-33 UNT-MCG/ML SOPN Inject 22 Units into the skin in the morning.    [provider]  leptospermum manuka honey (MEDIHONEY) PSTE paste Apply 1 Application topically daily. Monday and Thursday    [provider]  melatonin 5 MG TABS Take 5 mg by mouth at bedtime. 11/17/22   [provider]  metFORMIN  (GLUCOPHAGE ) 1000 MG tablet Take 1,000 mg by mouth 2 (two) times daily with a meal. 06/27/14   [provider]  metoprolol  succinate (TOPROL -XL) 100 MG 24 hr tablet Take 1 tablet (100 mg total) by mouth 2 (two) times daily. Take with or immediately following a meal. 10/30/23   Pokhrel, Laxman, MD  Multiple Vitamins-Minerals (MULTIVITAMIN MEN 50+) TABS Take 1 tablet by mouth in the morning and at bedtime.    [provider]  Omega-3 Fatty Acids (FISH OIL PO) Take 1 capsule by mouth 2 (two) times daily.  [provider]  omeprazole  (PRILOSEC) 20 MG capsule Take 1 capsule (20 mg total) by mouth daily. Patient taking differently: Take 20 mg by mouth in the morning. 11/15/22   Faustino Hook L, MD  polyethylene glycol (MIRALAX  / GLYCOLAX ) 17 g packet Take 17 g by mouth daily as needed for moderate constipation.    [provider]  potassium chloride  SA (KLOR-CON  M) 10 MEQ tablet Take 1 tablet (10 mEq total) by mouth daily. Patient taking differently: Take 10 mEq by mouth in the morning. 09/05/23   Krishnan, Gokul, MD  pregabalin  (LYRICA ) 50 MG capsule Take 1 capsule (50 mg total) by mouth 2 (two) times daily. 10/30/23 10/29/24  Pokhrel,  Laxman, MD  senna (SENOKOT) 8.6 MG TABS tablet Take 1 tablet by mouth in the morning and at bedtime.    [provider]    Allergies: Lipitor [atorvastatin], Zithromax [azithromycin], Zocor [simvastatin], and Norvasc  [amlodipine ]    Review of Systems  Updated Vital Signs There were no vitals taken for this visit.  Physical Exam Vitals and nursing note reviewed.  Constitutional:      General: He is not in acute distress.    Appearance: He is well-developed.  HENT:     Head: Normocephalic and atraumatic.     Mouth/Throat:     Mouth: Mucous membranes are moist.   Eyes:     General: Vision grossly intact. Gaze aligned appropriately.     Extraocular Movements: Extraocular movements intact.     Conjunctiva/sclera: Conjunctivae normal.    Cardiovascular:     Rate and Rhythm: Normal rate and regular rhythm.     Pulses: Normal pulses.     Heart sounds: Normal heart sounds, S1 normal and S2 normal. No murmur heard.    No friction rub. No gallop.  Pulmonary:     Effort: Pulmonary effort is normal. No respiratory distress.     Breath sounds: Normal breath sounds.  Abdominal:     Palpations: Abdomen is soft.     Tenderness: There is no abdominal tenderness. There is no guarding or rebound.     Hernia: No hernia is present.   Musculoskeletal:        General: No swelling.     Cervical back: Full passive range of motion without pain, normal range of motion and neck supple. No pain with movement, spinous process tenderness or muscular tenderness. Normal range of motion.     Right lower leg: No edema.     Left lower leg: No edema.   Skin:    General: Skin is warm and dry.     Capillary Refill: Capillary refill takes less than 2 seconds.     Findings: No ecchymosis, erythema, lesion or wound.   Neurological:     Mental Status: He is alert and oriented to person, place, and time.     GCS: GCS eye subscore is 4. GCS verbal subscore is 5. GCS motor subscore is 6.     Cranial  Nerves: Cranial nerves 2-12 are intact.     Sensory: Sensation is intact.     Motor: Motor function is intact. No weakness or abnormal muscle tone.     Coordination: Coordination is intact.   Psychiatric:        Mood and Affect: Mood normal.        Speech: Speech normal.        Behavior: Behavior normal.     (all labs ordered are listed, but only abnormal results are displayed) Labs  Reviewed  CULTURE, BLOOD (ROUTINE X 2)  CULTURE, BLOOD (ROUTINE X 2)  COMPREHENSIVE METABOLIC PANEL WITH GFR  CBC WITH DIFFERENTIAL/PLATELET  PROTIME-INR  URINALYSIS, W/ REFLEX TO CULTURE (INFECTION SUSPECTED)  I-STAT CG4 LACTIC ACID, ED    EKG: None  Radiology: No results found.  {Document cardiac monitor, telemetry assessment procedure when appropriate:32947} Procedures   Medications Ordered in the ED - No data to display    {Click here for ABCD2, HEART and other calculators REFRESH Note before signing:1}                              Medical Decision Making Amount and/or Complexity of Data Reviewed Labs: ordered. Radiology: ordered.   ***  {Document critical care time when appropriate  Document review of labs and clinical decision tools ie CHADS2VASC2, etc  Document your independent review of radiology images and any outside records  Document your discussion with family members, caretakers and with consultants  Document social determinants of health affecting pt's care  Document your decision making why or why not admission, treatments were needed:32947:::1}   Final diagnoses:  None    ED Discharge Orders     None

## 2023-11-09 NOTE — Progress Notes (Signed)
 Pt being followed by [facility] for Sepsis protocol.

## 2023-11-09 NOTE — Sepsis Progress Note (Signed)
Notified bedside nurse of need to draw repeat lactic acid (#3). 

## 2023-11-09 NOTE — ED Notes (Signed)
 Date and time results received: 11/09/23 952 (use smartphrase .now to insert current time)  Test: Lactate Critical Value: 2.4  Name of Provider Notified: Manny Sees

## 2023-11-09 NOTE — Progress Notes (Signed)
 Pharmacy Antibiotic Note  Steve Jensen is a 83 y.o. male admitted on 11/08/2023 with concern for sepsis. WBC 30.3, LA 4.4 > 2.4, and afebrile. Recent hospitalization for AMS earlier this month. Pharmacy has been consulted for vancomycin  dosing.  Plan: Vancomycin  1000mg  q24h (eAUC 519, Scr 1.8, Vd 0.5) F/u renal function, infectious work up and length of therapy  Vancomycin  levels as needed  Weight: 123.7 kg (272 lb 11.3 oz)  Temp (24hrs), Avg:98 F (36.7 C), Min:97.7 F (36.5 C), Bravlio:98.3 F (36.8 C)  Recent Labs  Lab 11/09/23 0058 11/09/23 0105 11/09/23 0510 11/09/23 0825 11/09/23 0910  WBC 30.3*  --   --   --   --   CREATININE 1.80*  --   --   --   --   LATICACIDVEN  --  3.7* 4.4* 2.6* 2.4*    Estimated Creatinine Clearance: 41.8 mL/min (A) (by C-G formula based on SCr of 1.8 mg/dL (H)).    Allergies  Allergen Reactions   Lipitor [Atorvastatin] Other (See Comments)    Myalgias   Zithromax [Azithromycin] Other (See Comments)    GI intolerance   Zocor [Simvastatin] Other (See Comments)    Myalgias    Norvasc  [Amlodipine ] Swelling    Antimicrobials this admission: Cefepime  6/16 > Vancomycin  6/16 > Flagyl 6/16 >  Microbiology results: 6/16 BCx: ngtd  Thank you for allowing pharmacy to be a part of this patient's care.  Fonda Hymen 11/09/2023 11:27 AM

## 2023-11-09 NOTE — ED Notes (Signed)
 Talked with MD about blood pressure, fluid bolus up and infusing, pt in bed with eyes closed, resps even and unlabored

## 2023-11-09 NOTE — ED Triage Notes (Signed)
 Pt to ED from [facility] for concern of possible aspiration. Per EMS pt had an episode of projectile vomiting this evening while they were changing his brief. Reported that pt turned blue briefly. Arrives A+O at baseline, VSS, NADN.

## 2023-11-09 NOTE — ED Notes (Signed)
 Pt voided on self, cleaned pt and changed bedding. Pt has no complaints at this time, pt awaits admission.

## 2023-11-09 NOTE — ED Notes (Signed)
Staffing called for sitter, no sitter available at this time.

## 2023-11-10 ENCOUNTER — Encounter (HOSPITAL_COMMUNITY): Payer: Self-pay | Admitting: Family Medicine

## 2023-11-10 DIAGNOSIS — A419 Sepsis, unspecified organism: Secondary | ICD-10-CM | POA: Diagnosis not present

## 2023-11-10 DIAGNOSIS — R6520 Severe sepsis without septic shock: Secondary | ICD-10-CM

## 2023-11-10 DIAGNOSIS — E872 Acidosis, unspecified: Secondary | ICD-10-CM | POA: Diagnosis not present

## 2023-11-10 DIAGNOSIS — A0472 Enterocolitis due to Clostridium difficile, not specified as recurrent: Secondary | ICD-10-CM

## 2023-11-10 DIAGNOSIS — G9341 Metabolic encephalopathy: Secondary | ICD-10-CM | POA: Diagnosis not present

## 2023-11-10 LAB — CBC
HCT: 36.5 % — ABNORMAL LOW (ref 39.0–52.0)
Hemoglobin: 11.8 g/dL — ABNORMAL LOW (ref 13.0–17.0)
MCH: 27.1 pg (ref 26.0–34.0)
MCHC: 32.3 g/dL (ref 30.0–36.0)
MCV: 83.7 fL (ref 80.0–100.0)
Platelets: 437 K/uL — ABNORMAL HIGH (ref 150–400)
RBC: 4.36 MIL/uL (ref 4.22–5.81)
RDW: 17.9 % — ABNORMAL HIGH (ref 11.5–15.5)
WBC: 20 K/uL — ABNORMAL HIGH (ref 4.0–10.5)
nRBC: 0 % (ref 0.0–0.2)

## 2023-11-10 LAB — COMPREHENSIVE METABOLIC PANEL WITH GFR
ALT: 13 U/L (ref 0–44)
AST: 16 U/L (ref 15–41)
Albumin: 2.1 g/dL — ABNORMAL LOW (ref 3.5–5.0)
Alkaline Phosphatase: 94 U/L (ref 38–126)
Anion gap: 9 (ref 5–15)
BUN: 25 mg/dL — ABNORMAL HIGH (ref 8–23)
CO2: 20 mmol/L — ABNORMAL LOW (ref 22–32)
Calcium: 8.1 mg/dL — ABNORMAL LOW (ref 8.9–10.3)
Chloride: 109 mmol/L (ref 98–111)
Creatinine, Ser: 1.55 mg/dL — ABNORMAL HIGH (ref 0.61–1.24)
GFR, Estimated: 45 mL/min — ABNORMAL LOW (ref >60)
Glucose, Bld: 155 mg/dL — ABNORMAL HIGH (ref 70–99)
Potassium: 3.1 mmol/L — ABNORMAL LOW (ref 3.5–5.1)
Sodium: 138 mmol/L (ref 135–145)
Total Bilirubin: 0.9 mg/dL (ref 0.0–1.2)
Total Protein: 4.6 g/dL — ABNORMAL LOW (ref 6.5–8.1)

## 2023-11-10 LAB — GLUCOSE, CAPILLARY
Glucose-Capillary: 119 mg/dL — ABNORMAL HIGH (ref 70–99)
Glucose-Capillary: 129 mg/dL — ABNORMAL HIGH (ref 70–99)
Glucose-Capillary: 144 mg/dL — ABNORMAL HIGH (ref 70–99)

## 2023-11-10 LAB — CBG MONITORING, ED
Glucose-Capillary: 150 mg/dL — ABNORMAL HIGH (ref 70–99)
Glucose-Capillary: 117 mg/dL — ABNORMAL HIGH (ref 70–99)

## 2023-11-10 LAB — C DIFFICILE QUICK SCREEN W PCR REFLEX
C Diff antigen: POSITIVE — AB
C Diff toxin: NEGATIVE

## 2023-11-10 LAB — MAGNESIUM: Magnesium: 1.6 mg/dL — ABNORMAL LOW (ref 1.7–2.4)

## 2023-11-10 LAB — CLOSTRIDIUM DIFFICILE BY PCR, REFLEXED: Toxigenic C. Difficile by PCR: POSITIVE — AB

## 2023-11-10 MED ORDER — VANCOMYCIN HCL 125 MG PO CAPS: 125.0000 mg | ORAL_CAPSULE | Freq: Four times a day (QID) | ORAL | Status: DC

## 2023-11-10 MED ORDER — MAGNESIUM SULFATE 4 GM/100ML IV SOLN
4.0000 g | Freq: Once | INTRAVENOUS | Status: AC
  Administered 2023-11-10: 4 g via INTRAVENOUS
  Filled 2023-11-10: qty 100

## 2023-11-10 MED ORDER — FIDAXOMICIN 200 MG PO TABS
200.0000 mg | ORAL_TABLET | Freq: Two times a day (BID) | ORAL | Status: DC
  Administered 2023-11-10 – 2023-11-14 (×9): 200 mg via ORAL
  Filled 2023-11-10 (×10): qty 1

## 2023-11-10 MED ORDER — POTASSIUM CHLORIDE 10 MEQ/100ML IV SOLN
10.0000 meq | INTRAVENOUS | Status: AC
  Administered 2023-11-10 (×6): 10 meq via INTRAVENOUS
  Filled 2023-11-10 (×6): qty 100

## 2023-11-10 MED ORDER — POTASSIUM CHLORIDE CRYS ER 20 MEQ PO TBCR
40.0000 meq | EXTENDED_RELEASE_TABLET | Freq: Once | ORAL | Status: AC
  Administered 2023-11-10: 40 meq via ORAL
  Filled 2023-11-10: qty 2

## 2023-11-10 MED ORDER — METOPROLOL SUCCINATE ER 50 MG PO TB24
50.0000 mg | ORAL_TABLET | Freq: Two times a day (BID) | ORAL | Status: DC
  Administered 2023-11-10 – 2023-11-17 (×14): 50 mg via ORAL
  Filled 2023-11-10 (×14): qty 1

## 2023-11-10 NOTE — Assessment & Plan Note (Addendum)
 11-10-2023 stable.  11-11-2023 stable  11-12-2023 stable  11-13-2023 stable

## 2023-11-10 NOTE — Assessment & Plan Note (Addendum)
 11-10-2023 pt without any other source for his leukocytosis. Dtr mentions that pt has had diarrhea for several days. C. Diff PCR positive. Although C diff toxin was negative, given that his has a dramatic leukocytosis without any other localizing source, I would consider this an acute C. Difff infection. Will start Dificid tonight.  11-11-2023 continue with Dificid. Only has received 2 doses thus far. If no decrease after 4 doses, may need to change to po vanco. Follow WBC. Will check CT abd/pelvis to evaluate for toxic megacolon. Pt does not have a tender abd.  11-12-2023 Pt with rectal tube now due to liquid diarrhea. Metabolic acidosis improved with IVF and po bicarb. Still profoundly hypokalemic. IV Kcl ordered. Not surprising given on-going diarrhea. Mg level fortunately stable.  Fortunately, WBC down to 16K. CT abd yesterday negative for toxic megacolon.  Order Ensure Liem TID to help get protein into him. Repeat Cbc in AM  11-13-2023 WBC down to 14K. No longer has rectal tube. Continue with po dificid bid. If he stays on same trajectory, should be able to be discharged to SNF on Monday, June 23

## 2023-11-10 NOTE — Assessment & Plan Note (Addendum)
 11-10-2023 stable. Off midodrine.  11-12-2023 resolved.

## 2023-11-10 NOTE — Hospital Course (Signed)
 HPI: Steve Jensen is a 83 y.o. male with medical history significant of PAF on Eliquis , hypertension, chronic HFpEF, OSA on CPAP, obesity, type 2 diabetes, CKD stage IIIa, GERD, hepatocellular carcinoma seen by IR, history of prostate cancer, falls who presents after having acute respiratory distress.  History is obtained from review of records as patient is confused currently and states that he is here due to a sign.   Records note patient was being laid flat to be changed at which point he had sudden onset of emesis.  After vomiting patient turned blue and appeared to be choking with difficulty breathing and possibly turned blue.  He was noted to have O2 saturations as low as 86% for which she was placed on 2 L of oxygen  and EMS called to transport to the hospital.   He had just recently been hospitalized 5/31-6/6 for altered mental status after having a fall due to weakness with reported low blood pressures.  Patient was noted not to have significant orthostatic hypotension when checked.  Patient was found to have a acute compression fracture of L4 for which patient was recommended LSO brace and outpatient follow-up.   Over the phone the patient's son makes note that they had just recently seen his father yesterday and he was doing well and seemed alert and oriented.  He does admit that his father has intermittently gotten confused following the falls during prior hospitalizations.   In the emergency department patient was noted to be afebrile with heart rates elevated up to 124 in atrial fibrillation, blood pressures noted to be as low as 88/53, and O2 saturations currently maintained on 2 L of nasal cannula oxygen .  Labs significant for WBC 32.3, platelets 595, potassium 2.7, BUN 32, creatinine 1.8, calcium  8.6, anion gap 16, and lactic acid 3.7-> 4.4-> 2.6.  Chest x-ray that showed no acute abnormality.  Urinalysis did not show significant signs for infection. Blood cultures were obtained.  Patient  was given 4 L lactated Ringer 's, potassium chloride  40 meq p.o., vancomycin , metronidazole, and cefepime .  Significant Events: Admitted 11/08/2023 for lactic acidosis and SIRS   Admission Labs: Lactic acid 3.7 WBC 30.3, HgB 15.1, plt 595 Na 139, K 2.7, CO2 of 20, BUN 32, Scr 1.80, glu 191 UA negative nitrite, negative LE, rare bacteria BNP 187 Procal 0.16  Admission Imaging Studies: CXR No acute findings.   Significant Labs:   Significant Imaging Studies:   Antibiotic Therapy: Anti-infectives (From admission, onward)    Start     Dose/Rate Route Frequency Ordered Stop   11/10/23 0400  vancomycin  (VANCOCIN ) IVPB 1000 mg/200 mL premix        1,000 mg 200 mL/hr over 60 Minutes Intravenous Every 24 hours 11/09/23 1228     11/09/23 1500  ceFEPIme  (MAXIPIME ) 2 g in sodium chloride  0.9 % 100 mL IVPB        2 g 200 mL/hr over 30 Minutes Intravenous Every 12 hours 11/09/23 1127     11/09/23 0145  vancomycin  (VANCOREADY) IVPB 2000 mg/400 mL        2,000 mg 200 mL/hr over 120 Minutes Intravenous  Once 11/09/23 0132 11/09/23 0449   11/09/23 0130  ceFEPIme  (MAXIPIME ) 2 g in sodium chloride  0.9 % 100 mL IVPB        2 g 200 mL/hr over 30 Minutes Intravenous  Once 11/09/23 0125 11/09/23 0323   11/09/23 0130  metroNIDAZOLE (FLAGYL) IVPB 500 mg        500 mg 100  mL/hr over 60 Minutes Intravenous  Once 11/09/23 0125 11/09/23 0245   11/09/23 0130  vancomycin  (VANCOCIN ) IVPB 1000 mg/200 mL premix  Status:  Discontinued        1,000 mg 200 mL/hr over 60 Minutes Intravenous  Once 11/09/23 0125 11/09/23 0132       Procedures:   Consultants:

## 2023-11-10 NOTE — Assessment & Plan Note (Addendum)
 11-10-2023 baseline Scr 1.3-1.6  11-11-2023 stable. Scr 1.42 today.  11-12-2023 stable. Scr 1.34 today.  11-13-2023 stable. Scr 1.28, BUN 15 today.  11-14-2023 Scr 1.22, BUN 13 today.  11-15-2023 Scr 1.33, BUN 10 today.

## 2023-11-10 NOTE — Assessment & Plan Note (Addendum)
 11-10-2023 stable.  11-11-2023 resolved.

## 2023-11-10 NOTE — Assessment & Plan Note (Addendum)
 11-10-2023 replete with IV Mg. Repeat Mg in AM.  His c. Diff diarrhea could certainly cause massive loss of K and Mg  11-11-2023 resolved with repletion. Mg today of 2.2

## 2023-11-10 NOTE — Assessment & Plan Note (Signed)
 11-10-2023 due to C. Diff infection. Now resolved. Mentation back to normal.

## 2023-11-10 NOTE — Assessment & Plan Note (Addendum)
 11-10-2023 likely due to c. Diff infection. Repeat CBC in AM. 11-11-2023 resolved. Plt down to 394K today.

## 2023-11-10 NOTE — Assessment & Plan Note (Signed)
 11-10-2023 likely due to C. Diff. Present on admission. Had leukocytosis of 30K, lactic acid of 3.7, HR 124.

## 2023-11-10 NOTE — TOC Initial Note (Signed)
 Transition of Care [facility]) - Initial/Assessment Note    Patient Details  Name: Steve Jensen MRN: [id number] Date of Birth: 07-May-1941  Transition of Care [facility] CM/SW Contact:    Carmon Christen, LCSWA Phone Number: 11/10/2023, 3:22 PM  Clinical Narrative:                  CSW received consult for possible SNF placement at time of discharge.Due to patients current orientation  CSW spoke with patients daughter [name] regarding PT recommendation of SNF placement at time of discharge. [name] reports PTA patient comes from [place] short term. Patient lives on the assisted living side.Patients daughter expressed understanding of PT recommendation and is agreeable to SNF placement at time of discharge. Patients daughter confirmed plan is [place] [facility] [place] [facility] [place] [facility] [place] [facility]. CSW discussed insurance authorization process.No further questions reported at this time. CSW spoke with Kristin with [facility] who confirmed patient can return back to [place] for short term rehab. CSW following to start insurance authorization closer to patient being medically stable.CSW to continue to follow and assist with discharge planning needs.     Expected Discharge Plan: Skilled Nursing Facility Barriers to Discharge: Continued Medical Work up   Patient Goals and CMS Choice     Choice offered to / list presented to : Adult Children [name] daughter)      Expected Discharge Plan and Services In-house Referral: Clinical Social Work     Living arrangements for the past 2 months:  (from [facility] short term)                                      Prior Living Arrangements/Services Living arrangements for the past 2 months:  (from [facility] short term) Lives with:: Facility Resident Patient language and need for interpreter reviewed:: Yes        Need for Family Participation in Patient Care: Yes (Comment) Care giver support system in place?: Yes  (comment)   Criminal Activity/Legal Involvement Pertinent to Current Situation/Hospitalization: No - Comment as needed  Activities of Daily Living   ADL Screening (condition at time of admission) Independently performs ADLs?: No Does the patient have a NEW difficulty with bathing/dressing/toileting/self-feeding that is expected to last >3 days?: Yes (Initiates electronic notice to provider for possible OT consult) Does the patient have a NEW difficulty with getting in/out of bed, walking, or climbing stairs that is expected to last >3 days?: Yes (Initiates electronic notice to provider for possible PT consult) Does the patient have a NEW difficulty with communication that is expected to last >3 days?: No Is the patient deaf or have difficulty hearing?: No Does the patient have difficulty seeing, even when wearing glasses/contacts?: No Does the patient have difficulty concentrating, remembering, or making decisions?: Yes  Permission Sought/Granted Permission sought to share information with : Case Manager, Magazine features editor, Family Supports                Emotional Assessment       Orientation: : Oriented to Self, Oriented to Place, Oriented to  Time Alcohol / Substance Use: Not Applicable Psych Involvement: No (comment)  Admission diagnosis:  Lactic acidosis [E87.20] Patient Active Problem List   Diagnosis Date Noted   Lactic acidosis 11/09/2023   Transient hypotension 11/09/2023   SIRS (systemic inflammatory response syndrome) (HCC) 11/09/2023   Nausea and vomiting 11/09/2023   Acute metabolic encephalopathy  11/09/2023   Chronic kidney disease, stage III (moderate) (HCC) 11/09/2023   Lumbar compression fracture (HCC) 11/09/2023   Thrombocytosis 11/09/2023   Gout 11/09/2023   Persistent atrial fibrillation with RVR (HCC) 10/27/2023   AMS (altered mental status) 10/24/2023   Metabolic acidosis 10/24/2023   SOB (shortness of breath) 10/24/2023   Syncope  09/04/2023   Cellulitis 09/04/2023   AKI (acute kidney injury) (HCC) 09/04/2023   Hyperkalemia 09/04/2023   Long term (current) use of anticoagulants 09/04/2023   Fall 09/04/2023   Chronic heart failure with preserved ejection fraction (HFpEF) (HCC) 09/04/2023   Acute on chronic diastolic (congestive) heart failure (HCC) 11/11/2022   Weakness of right lower extremity 11/11/2022   Hypokalemia 11/11/2022   Ileus (HCC)    Uncontrolled type 2 diabetes mellitus with hyperglycemia, without long-term current use of insulin  (HCC) 12/17/2021   Osteoarthritis of right knee 12/17/2021   Community acquired pneumonia of left lower lobe of lung 12/16/2021   Severe sepsis with acute organ dysfunction (HCC) 12/16/2021   HCC (hepatocellular carcinoma) (HCC) 02/15/2018   Hepatocellular carcinoma (HCC) 12/30/2017   Pain in joint of left shoulder 11/09/2017   Hypertensive heart disease with heart failure (HCC) 03/23/2017   Chronic diastolic heart failure (HCC) 03/19/2015   PAF (paroxysmal atrial fibrillation) (HCC) 08/11/2013   S/P left knee arthroscopy 07/11/2013   Mixed hyperlipidemia 03/03/2013   Essential hypertension, benign 03/03/2013   Malignant neoplasm of prostate (HCC) 03/03/2013   Osteoarthrosis, unspecified whether generalized or localized, other specified sites 03/03/2013   Allergic rhinitis 03/03/2013   Type 2 diabetes mellitus (HCC) 03/03/2013   Diabetic polyneuropathy (HCC) 03/03/2013   OSA (obstructive sleep apnea) 03/03/2013   Proteinuria 03/03/2013   Morbid obesity (HCC) 03/03/2013   Right knee pain 02/07/2013   PCP:  Glena Landau, MD Pharmacy:   [facility] [phone] - [place], [place] - [address] [place] [address] Phone: [phone] Fax: [phone]  [facility] - [place], [place] - [address] [place] [address] Phone: [phone] Fax: [phone]     Social  Drivers of Health (SDOH) Social History: SDOH Screenings   Food Insecurity: No Food Insecurity (11/10/2023)  Housing: Low Risk  (11/10/2023)  Transportation Needs: No Transportation Needs (11/10/2023)  Utilities: Not At Risk (11/10/2023)  Social Connections: Socially Isolated (11/10/2023)  Tobacco Use: Medium Risk (11/10/2023)   SDOH Interventions:     Readmission Risk Interventions    12/26/2021   12:51 PM 12/25/2021    3:31 PM 12/19/2021   12:39 PM  Readmission Risk Prevention Plan  Transportation Screening  Complete Complete  PCP or Specialist Appt within 5-7 Days Complete    Home Care Screening  Complete   Medication Review (RN CM)  Complete   HRI or Home Care Consult   Complete  Social Work Consult for Recovery Care Planning/Counseling   Complete  Palliative Care Screening   Not Applicable  Medication Review Oceanographer)   Complete

## 2023-11-10 NOTE — Assessment & Plan Note (Addendum)
 11-10-2023 stable. Holding lasix  for now due to volume depletion on admission.  11-11-2023 stable. Continue to hold lasix .  11-12-2023 continue to hold lasix . On IVF for continued diarrhea.  11-13-2023 continue to hold lasix . Can stop IVF.  11-14-2203 continue to hold lasix . Continue with oral hydration. On Toprol -XL.  11-15-2023 continue to hold lasix . Diarrhea slowing down. Push oral hydration. On toprol -XL. No ARB due to CKD.

## 2023-11-10 NOTE — Assessment & Plan Note (Addendum)
 11-10-2023 likely cause by C. Diff.  11-12-2023 resolved

## 2023-11-10 NOTE — Assessment & Plan Note (Addendum)
 11-10-2023 replete with IV Kcl and IV Mg. Repeat BMP and Mg in AM. His c. Diff diarrhea could certainly cause massive loss of K and Mg  11-11-2023 resolved after repletion.  11-12-2023 continue with IV Kcl replacement. Ongoing enteral losses due to diarrhea from C. Diff.  11-13-2023 continue with aggressive IV and po kcl replacement. Now that diarrhea has slowed down and decreasing po bicarb dose, hopefully K levels will be normal by tomorrow.  11-14-2023 due to on going GI losses from his c.diff diarrheal. After aggressive replacement for the last 4 days, serum K is up to 3.3. will give more oral Kcl. Hopefully stopping po bicarb will also help.  11-15-2023 continue with oral placement. Approaching normal K levels.

## 2023-11-10 NOTE — Evaluation (Signed)
 Physical Therapy Evaluation Patient Details Name: Steve Jensen MRN: [id number] DOB: Jul 28, 1940 Today's Date: 11/10/2023  History of Present Illness  Patient is 83 y.o. male presenting to ED from [facility] after vomiting with concern for sepsis. Pt recently admitted 5/31-6/6 with A-fib and AMS after a recent fall resulting in L4 comp fx w/ retropulsion 3mm, LSO brace for management. PMH significant for Afib, HTN, chronic HF-EF, OSA on CPAP, DM II, CKD III, GERD, hepatocellular carcinoma, prostate cancer.   Clinical Impression  Kristine RAFIQ BUCKLIN is 83 y.o. male admitted with above HPI and diagnosis. Patient is currently limited by functional impairments below (see PT problem list). Patient has been residing at [facility] SNF/ALF since ~ April this year and since last admission at start of June 2025 and return to SNF pt has been greatly limited to bed mobility with +2 assist and has not been able to transfer or ambulate. Patient currently required Chesky-Kasey +2 for bed mobility and was unable to safely progress to edge of stretcher today, pt also limited by back pain and LSO brace for L4 comp fx not in room. Patient will benefit from continued skilled PT interventions to address impairments and progress independence with mobility. Patient will benefit from continued inpatient follow up therapy, <3 hours/day. Acute PT will follow and progress as able.         If plan is discharge home, recommend the following: Help with stairs or ramp for entrance;Assist for transportation;Assistance with cooking/housework;Two people to help with walking and/or transfers;A lot of help with bathing/dressing/bathroom   Can travel by private vehicle   No    Equipment Recommendations None recommended by PT  Recommendations for Other Services  OT consult    Functional Status Assessment Patient has had a recent decline in their functional status and demonstrates the ability to make significant improvements in  function in a reasonable and predictable amount of time.     Precautions / Restrictions Precautions Precautions: Back;Fall Precaution Booklet Issued: Yes (comment) Recall of Precautions/Restrictions: Impaired Precaution/Restrictions Comments: watch HR Required Braces or Orthoses: Spinal Brace Spinal Brace: Lumbar corset Restrictions Weight Bearing Restrictions Per Provider Order: No      Mobility  Bed Mobility Overal bed mobility: Needs Assistance Bed Mobility: Rolling Rolling: Kejuan assist, Used rails   Supine to sit: Kian assist, Used rails, HOB elevated     General bed mobility comments: rec Tyson +2 as pt requires Atanacio/Total assist with cues to use UE's on bed rail for completing bed mobility. pt unsafe to progress EOB without +2 assist and on edge of stretcher given pt habitus    Transfers                        Ambulation/Gait                  Stairs            Wheelchair Mobility     Tilt Bed    Modified Rankin (Stroke Patients Only)       Balance                                             Pertinent Vitals/Pain Pain Assessment Pain Assessment: Faces Faces Pain Scale: Hurts a little bit Pain Location: back with bed mobility Pain Descriptors / Indicators: Discomfort Pain Intervention(s): Limited activity within  patient's tolerance, Monitored during session, Repositioned    Home Living Family/patient expects to be discharged to:: Assisted living       Home Access: Ramped entrance         Home Equipment: Standard Walker;Rollator (4 wheels);Cane - single point;BSC/3in1;Grab bars - tub/shower;Shower seat Additional Comments: Pt has been at SNF originally to SNF in April after admission then moved to ALF side of [facility]. Pt had fall at start of June and admitted to hospital then dc to SNF at [facility] and has not been able to mobilize beyond EOB since admission. Family reports at SNF it takes 2 people for  bed mobility but only getting to EOB. Prior to moving to [facility] pt was living with his grandson and mobilizing independently with RW; pt still references this and has poor awareness/memory of timeline and PLOF vs CLOF.    Prior Function Prior Level of Function : Needs assist;History of Falls (last six months)       Physical Assist : Mobility (physical);ADLs (physical) Mobility (physical): Bed mobility;Transfers ADLs (physical): Grooming;Bathing;Dressing;Toileting;IADLs         Extremity/Trunk Assessment   Upper Extremity Assessment Upper Extremity Assessment: Defer to OT evaluation;Generalized weakness    Lower Extremity Assessment Lower Extremity Assessment: Generalized weakness    Cervical / Trunk Assessment Cervical / Trunk Assessment: Other exceptions Cervical / Trunk Exceptions: habitus, L4 comp fx  Communication        Cognition Arousal: Alert Behavior During Therapy: Flat affect   PT - Cognitive impairments: Orientation, Awareness, Memory, Attention, Initiation, Sequencing, Problem solving, Safety/Judgement, No family/caregiver present to determine baseline   Orientation impairments: Situation, Time                     Following commands: Impaired Following commands impaired: Follows one step commands inconsistently, Follows one step commands with increased time     Cueing Cueing Techniques: Verbal cues, Tactile cues, Visual cues, Gestural cues     General Comments      Exercises     Assessment/Plan    PT Assessment Patient needs continued PT services  PT Problem List Decreased strength;Decreased mobility;Decreased balance;Decreased activity tolerance;Decreased coordination;Decreased cognition;Decreased knowledge of use of DME;Decreased safety awareness;Decreased knowledge of precautions;Cardiopulmonary status limiting activity;Obesity       PT Treatment Interventions DME instruction;Therapeutic exercise;Gait training;Balance  training;Functional mobility training;Therapeutic activities;Patient/family education;Neuromuscular re-education    PT Goals (Current goals can be found in the Care Plan section)  Acute Rehab PT Goals Patient Stated Goal: pt unable to state, not oriented to situation PT Goal Formulation: Patient unable to participate in goal setting Time For Goal Achievement: 11/24/23 Potential to Achieve Goals: Fair    Frequency Min 1X/week     Co-evaluation               AM-PAC PT 6 Clicks Mobility  Outcome Measure Help needed turning from your back to your side while in a flat bed without using bedrails?: Total Help needed moving from lying on your back to sitting on the side of a flat bed without using bedrails?: Total Help needed moving to and from a bed to a chair (including a wheelchair)?: Total Help needed standing up from a chair using your arms (e.g., wheelchair or bedside chair)?: Total Help needed to walk in hospital room?: Total Help needed climbing 3-5 steps with a railing? : Total 6 Click Score: 6    End of Session   Activity Tolerance: Patient tolerated treatment well Patient left: in bed;with  call bell/phone within reach;with bed alarm set Nurse Communication: Mobility status PT Visit Diagnosis: Unsteadiness on feet (R26.81);Other abnormalities of gait and mobility (R26.89);Repeated falls (R29.6);Muscle weakness (generalized) (M62.81);Difficulty in walking, not elsewhere classified (R26.2);Other symptoms and signs involving the nervous system (R29.898)    Time: 8295-6213 PT Time Calculation (min) (ACUTE ONLY): 16 min   Charges:   PT Evaluation $PT Eval Moderate Complexity: 1 Mod   PT General Charges $$ ACUTE PT VISIT: 1 Visit         Tish Forge, DPT Acute Rehabilitation Services Office [phone]  11/10/23 12:30 PM

## 2023-11-10 NOTE — Progress Notes (Signed)
   11/10/23 2346  BiPAP/CPAP/SIPAP  $ Non-Invasive Home Ventilator  Initial  $ Face Mask Medium Yes  BiPAP/CPAP/SIPAP Pt Type Adult  BiPAP/CPAP/SIPAP Resmed  Mask Type Full face mask  Mask Size Medium  Respiratory Rate 16 breaths/min  EPAP 10 cmH2O  FiO2 (%) 21 %  Patient Home Machine No  Patient Home Mask No  Patient Home Tubing No  Auto Titrate No  CPAP/SIPAP surface wiped down Yes  BiPAP/CPAP /SiPAP Vitals  Resp 16  SpO2 97 %

## 2023-11-10 NOTE — Plan of Care (Signed)

## 2023-11-10 NOTE — Assessment & Plan Note (Addendum)
 11-10-2023 on SSI. Holding metformin .  11-12-2023 CBG acceptable. Continue with SSI.  11-13-2023 acceptable CBG ranges. On SSI. Still holding metformin  and Soliqua  11-14-2023 CBG accepting. On SSI.  11-15-2023 acceptable CBG. On SSI.

## 2023-11-10 NOTE — Subjective & Objective (Addendum)
 Pt seen and examined.  Sleeping peacefully. On RA  Since yesterday(6-20-202) at 7 AM, pt has had 6 liquid diarrheal stools. No rectal tube. WBC stable at 13.9 K 3.3 today after aggressive Kcl supplementation and IV mg replacement.\ Pt ate 80% of his dinner last night. Pt is tolerating dificid  without difficulty. Abd is soft. No concern for toxic megacolon.

## 2023-11-10 NOTE — NC FL2 (Signed)
   MEDICAID FL2 LEVEL OF CARE FORM     IDENTIFICATION  Patient Name: Steve Jensen Birthdate: 21-Jun-1940 Sex: male Admission Date (Current Location): 11/08/2023  [facility] and [place] Number:  Producer, television/film/video and Address:  [facility], [address] [place], [place] [address]      Provider Number: [id number]  Attending Physician Name and Address:  Unk Garb, DO  Relative Name and Phone Number:  Rein Popov (daughter) [phone]    Current Level of Care: Hospital Recommended Level of Care: Skilled Nursing Facility Prior Approval Number:    Date Approved/Denied:   PASRR Number: [id number]  Discharge Plan: SNF    Current Diagnoses: Patient Active Problem List   Diagnosis Date Noted   Lactic acidosis 11/09/2023   Transient hypotension 11/09/2023   SIRS (systemic inflammatory response syndrome) (HCC) 11/09/2023   Nausea and vomiting 11/09/2023   Acute metabolic encephalopathy 11/09/2023   Chronic kidney disease, stage III (moderate) (HCC) 11/09/2023   Lumbar compression fracture (HCC) 11/09/2023   Thrombocytosis 11/09/2023   Gout 11/09/2023   Persistent atrial fibrillation with RVR (HCC) 10/27/2023   AMS (altered mental status) 10/24/2023   Metabolic acidosis 10/24/2023   SOB (shortness of breath) 10/24/2023   Syncope 09/04/2023   Cellulitis 09/04/2023   AKI (acute kidney injury) (HCC) 09/04/2023   Hyperkalemia 09/04/2023   Long term (current) use of anticoagulants 09/04/2023   Fall 09/04/2023   Chronic heart failure with preserved ejection fraction (HFpEF) (HCC) 09/04/2023   Acute on chronic diastolic (congestive) heart failure (HCC) 11/11/2022   Weakness of right lower extremity 11/11/2022   Hypokalemia 11/11/2022   Ileus (HCC)    Uncontrolled type 2 diabetes mellitus with hyperglycemia, without long-term current use of insulin  (HCC) 12/17/2021   Osteoarthritis of right knee 12/17/2021   Community acquired  pneumonia of left lower lobe of lung 12/16/2021   Severe sepsis with acute organ dysfunction (HCC) 12/16/2021   HCC (hepatocellular carcinoma) (HCC) 02/15/2018   Hepatocellular carcinoma (HCC) 12/30/2017   Pain in joint of left shoulder 11/09/2017   Hypertensive heart disease with heart failure (HCC) 03/23/2017   Chronic diastolic heart failure (HCC) 03/19/2015   PAF (paroxysmal atrial fibrillation) (HCC) 08/11/2013   S/P left knee arthroscopy 07/11/2013   Mixed hyperlipidemia 03/03/2013   Essential hypertension, benign 03/03/2013   Malignant neoplasm of prostate (HCC) 03/03/2013   Osteoarthrosis, unspecified whether generalized or localized, other specified sites 03/03/2013   Allergic rhinitis 03/03/2013   Type 2 diabetes mellitus (HCC) 03/03/2013   Diabetic polyneuropathy (HCC) 03/03/2013   OSA (obstructive sleep apnea) 03/03/2013   Proteinuria 03/03/2013   Morbid obesity (HCC) 03/03/2013   Right knee pain 02/07/2013    Orientation RESPIRATION BLADDER Height & Weight     Self, Time, Place  Normal Incontinent, External catheter (External Urinary Catheter) Weight: 272 lb 11.3 oz (123.7 kg) Height:  5' 9 (175.3 cm)  BEHAVIORAL SYMPTOMS/MOOD NEUROLOGICAL BOWEL NUTRITION STATUS      Incontinent Diet (Please see discharge summary)  AMBULATORY STATUS COMMUNICATION OF NEEDS Skin   Extensive Assist Verbally Other (Comment) (Abrasion,arm,leg,Ecchymosis,arm,leg,Bil.,Erythema,scrotum,abdomen,buttocks,Bil.,Wound/Incision LDAs,Wound/Incision open or dehised skin tear,knee,anterior,L,Wound/Incision open or dehisced arm,L,lower,posterior,proximal)                       Personal Care Assistance Level of Assistance    Bathing Assistance: Maximum assistance Feeding assistance: Limited assistance Dressing Assistance: Maximum assistance     Functional Limitations Info  Sight, Hearing, Speech Sight Info: Impaired Hearing Info:  Impaired Speech Info: Adequate    SPECIAL CARE FACTORS  FREQUENCY  PT (By licensed PT), OT (By licensed OT)     PT Frequency: 5x min weekly OT Frequency: 5x min weekly            Contractures Contractures Info: Not present    Additional Factors Info  Code Status, Allergies Code Status Info: DNR Allergies Info: Lipitor (atorvastatin),Zithromax (azithromycin),Zocor (simvastatin),Norvasc  (amlodipine ) Psychotropic Info: FLUoxetine  (PROZAC ) capsule 10 mg every evening,pregabalin  (LYRICA ) capsule 50 mg 2 times daily,  traZODone  (DESYREL ) tablet 25 mg daily at bedtime Insulin  Sliding Scale Info: insulin  aspart (novoLOG ) injection 0-9 Units 3 times daily with meals       Current Medications (11/10/2023):  This is the current hospital active medication list Current Facility-Administered Medications  Medication Dose Route Frequency Provider Last Rate Last Admin   acetaminophen  (TYLENOL ) tablet 650 mg  650 mg Oral Q6H PRN Smith, Rondell A, MD       Or   acetaminophen  (TYLENOL ) suppository 650 mg  650 mg Rectal Q6H PRN Manny Sees A, MD       albuterol  (PROVENTIL ) (2.5 MG/3ML) 0.083% nebulizer solution 2.5 mg  2.5 mg Nebulization Q6H PRN Smith, Rondell A, MD       allopurinol  (ZYLOPRIM ) tablet 100 mg  100 mg Oral q AM Felipe Horton, Rondell A, MD   100 mg at 11/10/23 0844   apixaban  (ELIQUIS ) tablet 2.5 mg  2.5 mg Oral BID Smith, Rondell A, MD   2.5 mg at 11/10/23 0845   ceFEPIme  (MAXIPIME ) 2 g in sodium chloride  0.9 % 100 mL IVPB  2 g Intravenous Q12H Smith, Rondell A, MD 200 mL/hr at 11/10/23 1415 2 g at 11/10/23 1415   docusate sodium  (COLACE) capsule 100 mg  100 mg Oral BID Smith, Rondell A, MD   100 mg at 11/10/23 0846   ezetimibe  (ZETIA ) tablet 10 mg  10 mg Oral q AM Smith, Rondell A, MD   10 mg at 11/10/23 0845   FLUoxetine  (PROZAC ) capsule 10 mg  10 mg Oral QPM Smith, Rondell A, MD   10 mg at 11/09/23 1820   insulin  aspart (novoLOG ) injection 0-9 Units  0-9 Units Subcutaneous TID WC Smith, Rondell A, MD       ketoconazole (NIZORAL) 2 % cream 1  Application  1 Application Topical BID PRN Smith, Rondell A, MD       magnesium  sulfate IVPB 4 g 100 mL  4 g Intravenous Once Unk Garb, DO 50 mL/hr at 11/10/23 1531 4 g at 11/10/23 1531   midodrine (PROAMATINE) tablet 5 mg  5 mg Oral TID WC Smith, Rondell A, MD   5 mg at 11/10/23 1244   pantoprazole  (PROTONIX ) EC tablet 40 mg  40 mg Oral Daily Smith, Rondell A, MD   40 mg at 11/10/23 0845   potassium chloride  10 mEq in 100 mL IVPB  10 mEq Intravenous Q1 Hr x 6 Unk Garb, DO 100 mL/hr at 11/10/23 1532 10 mEq at 11/10/23 1532   pregabalin  (LYRICA ) capsule 50 mg  50 mg Oral BID Smith, Rondell A, MD   50 mg at 11/10/23 0845   senna (SENOKOT) tablet 8.6 mg  1 tablet Oral QHS Smith, Rondell A, MD   8.6 mg at 11/09/23 2354   sodium chloride  flush (NS) 0.9 % injection 3 mL  3 mL Intravenous Q12H Smith, Rondell A, MD   3 mL at 11/10/23 0846   traZODone  (DESYREL ) tablet 25 mg  25 mg Oral  QHS Smith, Rondell A, MD   25 mg at 11/09/23 2355   vancomycin  (VANCOCIN ) IVPB 1000 mg/200 mL premix  1,000 mg Intravenous Q24H Smith, Rondell A, MD   Stopped at 11/10/23 6045     Discharge Medications: Please see discharge summary for a list of discharge medications.  Relevant Imaging Results:  Relevant Lab Results:   Additional Information [id number]  Carmon Christen, LCSWA

## 2023-11-10 NOTE — Assessment & Plan Note (Signed)
 11-10-2023 Body mass index is 40.27 kg/m.

## 2023-11-10 NOTE — Assessment & Plan Note (Addendum)
 11-10-2023 stable. Continue Eliquis . Restart Toprol   11-11-2023 stable. Continue Eliquis  and Toprol . If CT abd shows toxic megacolon, will need to stop Eliquis .  11-12-2023 stable. Continue eliquis  and Toprol -XL.  11-13-2023 stable  11-14-2023 stable. On Eliquis  2.5 mg bid and toprol -xl 50 mg bid.  11-15-2023 continue Eliquis  2.5 mg bid and Topro-xl 50 mg bid.

## 2023-11-10 NOTE — Progress Notes (Signed)
 PROGRESS NOTE    Steve Jensen  WUJ:[id number] DOB: 1940-11-21 DOA: 11/08/2023 PCP: Glena Landau, MD  Subjective: Pt seen and examined.  Met with the patient's daughter [name] at bedside.  Patient has been getting rehab at [facility].  Had a white count initially 30,000.  Patient treated with IV fluids and antibiotics.  He is on room air.  White count down to 20,000.  He had lactic acidosis.  Daughter states that the patient has been on fluid restriction at the nursing home along with daily Lasix .  He had been discharged on October 30, 2023 with Lasix  40 mg daily along with a 1500 cc/day fluid restriction.  Patient states that he feels much better after IV fluid resuscitation.  Daughter states the patient has not walked much since she was discharged on June 6 at the nursing home.  No evidence of pneumonia on his chest x-ray.  Room air saturations are 97% on room air.   Hospital Course: HPI: Steve Jensen is a 83 y.o. male with medical history significant of PAF on Eliquis , hypertension, chronic HFpEF, OSA on CPAP, obesity, type 2 diabetes, CKD stage IIIa, GERD, hepatocellular carcinoma seen by IR, history of prostate cancer, falls who presents after having acute respiratory distress.  History is obtained from review of records as patient is confused currently and states that he is here due to a sign.   Records note patient was being laid flat to be changed at which point he had sudden onset of emesis.  After vomiting patient turned blue and appeared to be choking with difficulty breathing and possibly turned blue.  He was noted to have O2 saturations as low as 86% for which she was placed on 2 L of oxygen  and EMS called to transport to the hospital.   He had just recently been hospitalized 5/31-6/6 for altered mental status after having a fall due to weakness with reported low blood pressures.  Patient was noted not to have significant orthostatic hypotension when checked.  Patient  was found to have a acute compression fracture of L4 for which patient was recommended LSO brace and outpatient follow-up.   Over the phone the patient's son makes note that they had just recently seen his father yesterday and he was doing well and seemed alert and oriented.  He does admit that his father has intermittently gotten confused following the falls during prior hospitalizations.   In the emergency department patient was noted to be afebrile with heart rates elevated up to 124 in atrial fibrillation, blood pressures noted to be as low as 88/53, and O2 saturations currently maintained on 2 L of nasal cannula oxygen .  Labs significant for WBC 32.3, platelets 595, potassium 2.7, BUN 32, creatinine 1.8, calcium  8.6, anion gap 16, and lactic acid 3.7-> 4.4-> 2.6.  Chest x-ray that showed no acute abnormality.  Urinalysis did not show significant signs for infection. Blood cultures were obtained.  Patient was given 4 L lactated Ringer 's, potassium chloride  40 meq p.o., vancomycin , metronidazole, and cefepime .  Significant Events: Admitted 11/08/2023 for lactic acidosis and SIRS   Admission Labs: Lactic acid 3.7 WBC 30.3, HgB 15.1, plt 595 Na 139, K 2.7, CO2 of 20, BUN 32, Scr 1.80, glu 191 UA negative nitrite, negative LE, rare bacteria BNP 187 Procal 0.16  Admission Imaging Studies: CXR No acute findings.   Significant Labs:   Significant Imaging Studies:   Antibiotic Therapy: Anti-infectives (From admission, onward)    Start  Dose/Rate Route Frequency Ordered Stop   11/10/23 0400  vancomycin  (VANCOCIN ) IVPB 1000 mg/200 mL premix        1,000 mg 200 mL/hr over 60 Minutes Intravenous Every 24 hours 11/09/23 1228     11/09/23 1500  ceFEPIme  (MAXIPIME ) 2 g in sodium chloride  0.9 % 100 mL IVPB        2 g 200 mL/hr over 30 Minutes Intravenous Every 12 hours 11/09/23 1127     11/09/23 0145  vancomycin  (VANCOREADY) IVPB 2000 mg/400 mL        2,000 mg 200 mL/hr over 120 Minutes  Intravenous  Once 11/09/23 0132 11/09/23 0449   11/09/23 0130  ceFEPIme  (MAXIPIME ) 2 g in sodium chloride  0.9 % 100 mL IVPB        2 g 200 mL/hr over 30 Minutes Intravenous  Once 11/09/23 0125 11/09/23 0323   11/09/23 0130  metroNIDAZOLE (FLAGYL) IVPB 500 mg        500 mg 100 mL/hr over 60 Minutes Intravenous  Once 11/09/23 0125 11/09/23 0245   11/09/23 0130  vancomycin  (VANCOCIN ) IVPB 1000 mg/200 mL premix  Status:  Discontinued        1,000 mg 200 mL/hr over 60 Minutes Intravenous  Once 11/09/23 0125 11/09/23 0132       Procedures:   Consultants:     Assessment and Plan: * Lactic acidosis 11-10-2023 likely cause by C. Diff.  C. difficile diarrhea 11-10-2023 pt without any other source for his leukocytosis. Dtr mentions that pt has had diarrhea for several days. C. Diff PCR positive. Although C diff toxin was negative, given that his has a dramatic leukocytosis without any other localizing source, I would consider this an acute C. Difff infection. Will start Dificid tonight.  Acute metabolic encephalopathy 11-10-2023 due to C. Diff infection. Now resolved. Mentation back to normal.  Sepsis (HCC) 11-10-2023 likely due to C. Diff. Present on admission. Had leukocytosis of 30K, lactic acid of 3.7, HR 124.  Hypomagnesemia 11-10-2023 replete with IV Mg. Repeat Mg in AM.  His c. Diff diarrhea could certainly cause massive loss of K and Mg  Gout 11-10-2023 stable.  Thrombocytosis 11-10-2023 likely due to c. Diff infection. Repeat CBC in AM.  Lumbar compression fracture (HCC) 11-10-2023 stable.  CKD stage 3b, GFR 30-44 ml/min (HCC) 11-10-2023 baseline Scr 1.3-1.6  Nausea and vomiting 11-10-2023 stable.  Transient hypotension 11-10-2023 stable. Off midodrine.  Chronic heart failure with preserved ejection fraction (HFpEF) (HCC) 11-10-2023 stable. Holding lasix  for now due to volume depletion on admission.  Hypokalemia 11-10-2023 replete with IV Kcl and IV Mg. Repeat  BMP and Mg in AM. His c. Diff diarrhea could certainly cause massive loss of K and Mg  PAF (paroxysmal atrial fibrillation) (HCC) 11-10-2023 stable. Continue Eliquis . Restart Toprol   Morbid obesity (HCC) 11-10-2023 Body mass index is 40.27 kg/m.   OSA (obstructive sleep apnea) 11-10-2023 stable.  Type 2 diabetes mellitus (HCC) 11-10-2023 on SSI. Holding metformin .   DVT prophylaxis: apixaban  (ELIQUIS ) tablet 2.5 mg Start: 11/09/23 1200 apixaban  (ELIQUIS ) tablet 2.5 mg     Code Status: Limited: Do not attempt resuscitation (DNR) -DNR-LIMITED -Do Not Intubate/DNI  Family Communication: discussed with pt and his dtr Steve Jensen at bedside Disposition Plan: return to SNF Reason for continuing need for hospitalization: starting dificid. Awaiting improvement in leukocytosis.  Objective: Vitals:   11/10/23 1254 11/10/23 1321 11/10/23 1514 11/10/23 1622  BP: 113/62 116/63  123/81  Pulse:  97  (!) 110  Resp:  20  20  Temp:  97.6 F (36.4 C)  97.8 F (36.6 C)  TempSrc:  Oral  Axillary  SpO2:    97%  Weight:      Height:   5' 9 (1.753 m)     Intake/Output Summary (Last 24 hours) at 11/10/2023 1859 Last data filed at 11/10/2023 1810 Gross per 24 hour  Intake 0 ml  Output 400 ml  Net -400 ml   Filed Weights   11/09/23 0010  Weight: 123.7 kg    Examination:  Physical Exam Vitals and nursing note reviewed.  Constitutional:      General: He is not in acute distress.    Appearance: He is obese. He is not toxic-appearing.  HENT:     Head: Normocephalic and atraumatic.     Nose: Nose normal.   Eyes:     General: No scleral icterus.   Cardiovascular:     Rate and Rhythm: Normal rate and regular rhythm.  Pulmonary:     Effort: Pulmonary effort is normal.  Abdominal:     General: Abdomen is protuberant. There is no distension.     Palpations: Abdomen is soft.   Musculoskeletal:     Comments: Chronic venous stasis bilateral lower legs.   Skin:    General: Skin is  warm.     Capillary Refill: Capillary refill takes less than 2 seconds.   Neurological:     Mental Status: He is alert and oriented to person, place, and time.     Data Reviewed: I have personally reviewed following labs and imaging studies  CBC: Recent Labs  Lab 11/09/23 0058 11/10/23 0535  WBC 30.3* 20.0*  NEUTROABS 25.6*  --   HGB 15.1 11.8*  HCT 45.5 36.5*  MCV 82.1 83.7  PLT 595* 437*   Basic Metabolic Panel: Recent Labs  Lab 11/09/23 0058 11/10/23 0535 11/10/23 1400  NA 139 138  --   K 2.7* 3.1*  --   CL 103 109  --   CO2 20* 20*  --   GLUCOSE 191* 155*  --   BUN 32* 25*  --   CREATININE 1.80* 1.55*  --   CALCIUM  8.6* 8.1*  --   MG  --   --  1.6*   GFR: Estimated Creatinine Clearance: 48.6 mL/min (A) (by C-G formula based on SCr of 1.55 mg/dL (H)). Liver Function Tests: Recent Labs  Lab 11/09/23 0058 11/10/23 0535  AST 20 16  ALT 18 13  ALKPHOS 149* 94  BILITOT 0.6 0.9  PROT 5.9* 4.6*  ALBUMIN  2.8* 2.1*   Coagulation Profile: Recent Labs  Lab 11/09/23 0058  INR 1.2   BNP (last 3 results) Recent Labs    09/03/23 2103 10/24/23 1123 11/09/23 1048  BNP 152.3* 411.2* 187.8*   CBG: Recent Labs  Lab 11/09/23 1741 11/10/23 0535 11/10/23 0848 11/10/23 1351 11/10/23 1625  GLUCAP 119* 150* 117* 119* 129*   Sepsis Labs: Recent Labs  Lab 11/09/23 0510 11/09/23 0825 11/09/23 0910 11/09/23 1048 11/09/23 1312  PROCALCITON  --   --   --  0.16  --   LATICACIDVEN 4.4* 2.6* 2.4*  --  1.8    Recent Results (from the past 240 hours)  Blood Culture (routine x 2)     Status: None (Preliminary result)   Collection Time: 11/09/23 12:10 AM   Specimen: BLOOD  Result Value Ref Range Status   Specimen Description BLOOD BLOOD LEFT FOREARM  Final   Special Requests   Final  BOTTLES DRAWN AEROBIC AND ANAEROBIC Blood Culture results may not be optimal due to an inadequate volume of blood received in culture bottles   Culture   Final    NO GROWTH  1 DAY Performed at [facility], [address], [place], [place] [address]    Report Status PENDING  Incomplete  Blood Culture (routine x 2)     Status: None (Preliminary result)   Collection Time: 11/09/23 12:26 AM   Specimen: BLOOD  Result Value Ref Range Status   Specimen Description BLOOD BLOOD RIGHT ARM AEROBIC BOTTLE ONLY  Final   Special Requests   Final    BOTTLES DRAWN AEROBIC ONLY Blood Culture adequate volume   Culture   Final    NO GROWTH 1 DAY Performed at [facility], [address], [place], [place] [address]    Report Status PENDING  Incomplete  C Difficile Quick Screen w PCR reflex     Status: Abnormal   Collection Time: 11/10/23  3:45 PM   Specimen: STOOL  Result Value Ref Range Status   C Diff antigen POSITIVE (A) NEGATIVE Final   C Diff toxin NEGATIVE NEGATIVE Final   C Diff interpretation Results are indeterminate. See PCR results.  Final    Comment: Performed at [facility], [address], [place], [place] [address]  C. Diff by PCR, Reflexed     Status: Abnormal   Collection Time: 11/10/23  3:45 PM  Result Value Ref Range Status   Toxigenic C. Difficile by PCR POSITIVE (A) NEGATIVE Final    Comment: Positive for toxigenic C. difficile with little to no toxin production. Only treat if clinical presentation suggests symptomatic illness. Performed at [facility], [address] [place], [place] [address]      Radiology Studies: DG Chest Port 1 View Result Date: 11/09/2023 EXAM: 1 VIEW XRAY OF THE CHEST 11/09/2023 12:24:00 AM COMPARISON: 10/24/2023 CLINICAL HISTORY: Questionable sepsis - evaluate for abnormality. sepsis FINDINGS: LUNGS AND PLEURA: Mild platelike scarring in the left mid lung. No focal pulmonary opacity. No pulmonary edema. No pleural effusion. No pneumothorax. HEART AND MEDIASTINUM: No acute abnormality of the cardiac and mediastinal silhouettes. Thoracic aortic atherosclerosis. BONES AND SOFT TISSUES: Old right  clavicle fracture. No acute osseous abnormality. IMPRESSION: 1. No acute findings. Electronically signed by: Sriyesh Krishnan MD 11/09/2023 12:30 AM EDT RP Workstation: [id number]    Scheduled Meds:  allopurinol   100 mg Oral q AM   apixaban   2.5 mg Oral BID   docusate sodium   100 mg Oral BID   ezetimibe   10 mg Oral q AM   fidaxomicin  200 mg Oral BID   FLUoxetine   10 mg Oral QPM   insulin  aspart  0-9 Units Subcutaneous TID WC   metoprolol  succinate  50 mg Oral BID   midodrine  5 mg Oral TID WC   pantoprazole   40 mg Oral Daily   pregabalin   50 mg Oral BID   senna  1 tablet Oral QHS   sodium chloride  flush  3 mL Intravenous Q12H   traZODone   25 mg Oral QHS   Continuous Infusions:  potassium chloride  10 mEq (11/10/23 1848)     LOS: 1 day   Time spent: 55 minutes  Unk Garb, DO  [facility]  11/10/2023, 6:59 PM

## 2023-11-10 NOTE — ED Notes (Signed)
 Pt's brief wet- changed and cleaned.

## 2023-11-11 ENCOUNTER — Inpatient Hospital Stay (HOSPITAL_COMMUNITY)

## 2023-11-11 DIAGNOSIS — D3001 Benign neoplasm of right kidney: Secondary | ICD-10-CM | POA: Diagnosis not present

## 2023-11-11 DIAGNOSIS — E8721 Acute metabolic acidosis: Secondary | ICD-10-CM | POA: Diagnosis not present

## 2023-11-11 DIAGNOSIS — K769 Liver disease, unspecified: Secondary | ICD-10-CM | POA: Diagnosis not present

## 2023-11-11 DIAGNOSIS — K7460 Unspecified cirrhosis of liver: Secondary | ICD-10-CM | POA: Diagnosis not present

## 2023-11-11 DIAGNOSIS — D3002 Benign neoplasm of left kidney: Secondary | ICD-10-CM | POA: Diagnosis not present

## 2023-11-11 DIAGNOSIS — A0472 Enterocolitis due to Clostridium difficile, not specified as recurrent: Secondary | ICD-10-CM | POA: Diagnosis not present

## 2023-11-11 DIAGNOSIS — E872 Acidosis, unspecified: Secondary | ICD-10-CM | POA: Diagnosis not present

## 2023-11-11 DIAGNOSIS — N1832 Chronic kidney disease, stage 3b: Secondary | ICD-10-CM | POA: Diagnosis not present

## 2023-11-11 LAB — GLUCOSE, CAPILLARY
Glucose-Capillary: 121 mg/dL — ABNORMAL HIGH (ref 70–99)
Glucose-Capillary: 134 mg/dL — ABNORMAL HIGH (ref 70–99)
Glucose-Capillary: 135 mg/dL — ABNORMAL HIGH (ref 70–99)
Glucose-Capillary: 137 mg/dL — ABNORMAL HIGH (ref 70–99)
Glucose-Capillary: 171 mg/dL — ABNORMAL HIGH (ref 70–99)

## 2023-11-11 LAB — CBC WITH DIFFERENTIAL/PLATELET
Abs Immature Granulocytes: 0.3 10*3/uL — ABNORMAL HIGH (ref 0.00–0.07)
Basophils Absolute: 0.2 10*3/uL — ABNORMAL HIGH (ref 0.0–0.1)
Basophils Relative: 1 %
Eosinophils Absolute: 0.4 10*3/uL (ref 0.0–0.5)
Eosinophils Relative: 2 %
HCT: 38.9 % — ABNORMAL LOW (ref 39.0–52.0)
Hemoglobin: 12.6 g/dL — ABNORMAL LOW (ref 13.0–17.0)
Immature Granulocytes: 1 %
Lymphocytes Relative: 6 %
Lymphs Abs: 1.5 10*3/uL (ref 0.7–4.0)
MCH: 27.5 pg (ref 26.0–34.0)
MCHC: 32.4 g/dL (ref 30.0–36.0)
MCV: 84.9 fL (ref 80.0–100.0)
Monocytes Absolute: 2.6 10*3/uL — ABNORMAL HIGH (ref 0.1–1.0)
Monocytes Relative: 10 %
Neutro Abs: 20.8 10*3/uL — ABNORMAL HIGH (ref 1.7–7.7)
Neutrophils Relative %: 80 %
Platelets: 394 10*3/uL (ref 150–400)
RBC: 4.58 MIL/uL (ref 4.22–5.81)
RDW: 18.4 % — ABNORMAL HIGH (ref 11.5–15.5)
WBC: 25.8 10*3/uL — ABNORMAL HIGH (ref 4.0–10.5)
nRBC: 0 % (ref 0.0–0.2)

## 2023-11-11 LAB — BASIC METABOLIC PANEL WITH GFR
Anion gap: 13 (ref 5–15)
BUN: 21 mg/dL (ref 8–23)
CO2: 11 mmol/L — ABNORMAL LOW (ref 22–32)
Calcium: 7.8 mg/dL — ABNORMAL LOW (ref 8.9–10.3)
Chloride: 114 mmol/L — ABNORMAL HIGH (ref 98–111)
Creatinine, Ser: 1.42 mg/dL — ABNORMAL HIGH (ref 0.61–1.24)
GFR, Estimated: 50 mL/min — ABNORMAL LOW (ref >60)
Glucose, Bld: 125 mg/dL — ABNORMAL HIGH (ref 70–99)
Potassium: 4.1 mmol/L (ref 3.5–5.1)
Sodium: 138 mmol/L (ref 135–145)

## 2023-11-11 LAB — MAGNESIUM: Magnesium: 2.2 mg/dL (ref 1.7–2.4)

## 2023-11-11 MED ORDER — SODIUM BICARBONATE 650 MG PO TABS
1300.0000 mg | ORAL_TABLET | Freq: Two times a day (BID) | ORAL | Status: DC
  Administered 2023-11-11 – 2023-11-13 (×5): 1300 mg via ORAL
  Filled 2023-11-11 (×5): qty 2

## 2023-11-11 MED ORDER — LACTATED RINGERS IV SOLN: INTRAVENOUS | Status: AC

## 2023-11-11 NOTE — Assessment & Plan Note (Addendum)
 11-11-2023 start po bicarb 1300 mg bid. Repeat BMP in AM.  11-12-2023 improved with IV and po bicarb. Continue with both while he still has on-going diarrhea. Repeat BMP in aM.  11-13-2023 decrease po bicarb to 650 mg bid. Repeat BMP in AM. Now that he is no longer having massive diarrhea. Serum bicarb should be better tomorrow.  11-14-2023 stop bicarb. This should help with hypokalemia. Repeat BMP in Am.  11-15-2023 stable off po bicarb

## 2023-11-11 NOTE — Plan of Care (Signed)

## 2023-11-11 NOTE — TOC Progression Note (Signed)
 Transition of Care [facility] - Progression Note    Patient Details  Name: Steve Jensen MRN: [id number] Date of Birth: 1940-11-14  [facility] CM/SW Contact  Carmon Christen, LCSWA Phone Number: 11/11/2023, 12:17 PM  Clinical Narrative:     Patient has SNF bed at [facility] when medically ready for dc. CSW following to start insurance authorization closer to patient being medically ready for dc. CSW will continue to follow.  Expected Discharge Plan: Skilled Nursing Facility Barriers to Discharge: Continued Medical Work up  Expected Discharge Plan and Services In-house Referral: Clinical Social Work     Living arrangements for the past 2 months:  (from [place] short term)                                       Social Determinants of Health (SDOH) Interventions SDOH Screenings   Food Insecurity: No Food Insecurity (11/10/2023)  Housing: Low Risk  (11/10/2023)  Transportation Needs: No Transportation Needs (11/10/2023)  Utilities: Not At Risk (11/10/2023)  Social Connections: Socially Isolated (11/10/2023)  Tobacco Use: Medium Risk (11/10/2023)    Readmission Risk Interventions    12/26/2021   12:51 PM 12/25/2021    3:31 PM 12/19/2021   12:39 PM  Readmission Risk Prevention Plan  Transportation Screening  Complete Complete  PCP or Specialist Appt within 5-7 Days Complete    Home Care Screening  Complete   Medication Review (RN CM)  Complete   HRI or Home Care Consult   Complete  Social Work Consult for Recovery Care Planning/Counseling   Complete  Palliative Care Screening   Not Applicable  Medication Review Oceanographer)   Complete

## 2023-11-11 NOTE — Progress Notes (Signed)
 PROGRESS NOTE    Steve Jensen  ONG:[id number] DOB: 08-13-1940 DOA: 11/08/2023 PCP: Glena Landau, MD  Subjective: Pt seen and examined.  Started on Dificid last night.  Only 1 liquid BM yesterday evening @ 2100.  WBC 25.8K. afebrile  Pt states he does feel better. Awaiting PT evaluation.   Hospital Course: HPI: Steve Jensen is a 83 y.o. male with medical history significant of PAF on Eliquis , hypertension, chronic HFpEF, OSA on CPAP, obesity, type 2 diabetes, CKD stage IIIa, GERD, hepatocellular carcinoma seen by IR, history of prostate cancer, falls who presents after having acute respiratory distress.  History is obtained from review of records as patient is confused currently and states that he is here due to a sign.   Records note patient was being laid flat to be changed at which point he had sudden onset of emesis.  After vomiting patient turned blue and appeared to be choking with difficulty breathing and possibly turned blue.  He was noted to have O2 saturations as low as 86% for which she was placed on 2 L of oxygen  and EMS called to transport to the hospital.   He had just recently been hospitalized 5/31-6/6 for altered mental status after having a fall due to weakness with reported low blood pressures.  Patient was noted not to have significant orthostatic hypotension when checked.  Patient was found to have a acute compression fracture of L4 for which patient was recommended LSO brace and outpatient follow-up.   Over the phone the patient's son makes note that they had just recently seen his father yesterday and he was doing well and seemed alert and oriented.  He does admit that his father has intermittently gotten confused following the falls during prior hospitalizations.   In the emergency department patient was noted to be afebrile with heart rates elevated up to 124 in atrial fibrillation, blood pressures noted to be as low as 88/53, and O2 saturations currently  maintained on 2 L of nasal cannula oxygen .  Labs significant for WBC 32.3, platelets 595, potassium 2.7, BUN 32, creatinine 1.8, calcium  8.6, anion gap 16, and lactic acid 3.7-> 4.4-> 2.6.  Chest x-ray that showed no acute abnormality.  Urinalysis did not show significant signs for infection. Blood cultures were obtained.  Patient was given 4 L lactated Ringer 's, potassium chloride  40 meq p.o., vancomycin , metronidazole, and cefepime .  Significant Events: Admitted 11/08/2023 for lactic acidosis and SIRS   Admission Labs: Lactic acid 3.7 WBC 30.3, HgB 15.1, plt 595 Na 139, K 2.7, CO2 of 20, BUN 32, Scr 1.80, glu 191 UA negative nitrite, negative LE, rare bacteria BNP 187 Procal 0.16  Admission Imaging Studies: CXR No acute findings.   Significant Labs:   Significant Imaging Studies:   Antibiotic Therapy: Anti-infectives (From admission, onward)    Start     Dose/Rate Route Frequency Ordered Stop   11/10/23 0400  vancomycin  (VANCOCIN ) IVPB 1000 mg/200 mL premix        1,000 mg 200 mL/hr over 60 Minutes Intravenous Every 24 hours 11/09/23 1228     11/09/23 1500  ceFEPIme  (MAXIPIME ) 2 g in sodium chloride  0.9 % 100 mL IVPB        2 g 200 mL/hr over 30 Minutes Intravenous Every 12 hours 11/09/23 1127     11/09/23 0145  vancomycin  (VANCOREADY) IVPB 2000 mg/400 mL        2,000 mg 200 mL/hr over 120 Minutes Intravenous  Once 11/09/23 0132 11/09/23 0449  11/09/23 0130  ceFEPIme  (MAXIPIME ) 2 g in sodium chloride  0.9 % 100 mL IVPB        2 g 200 mL/hr over 30 Minutes Intravenous  Once 11/09/23 0125 11/09/23 0323   11/09/23 0130  metroNIDAZOLE (FLAGYL) IVPB 500 mg        500 mg 100 mL/hr over 60 Minutes Intravenous  Once 11/09/23 0125 11/09/23 0245   11/09/23 0130  vancomycin  (VANCOCIN ) IVPB 1000 mg/200 mL premix  Status:  Discontinued        1,000 mg 200 mL/hr over 60 Minutes Intravenous  Once 11/09/23 0125 11/09/23 0132       Procedures:   Consultants:     Assessment and  Plan: * Lactic acidosis 11-10-2023 likely cause by C. Diff.  C. difficile diarrhea 11-10-2023 pt without any other source for his leukocytosis. Dtr mentions that pt has had diarrhea for several days. C. Diff PCR positive. Although C diff toxin was negative, given that his has a dramatic leukocytosis without any other localizing source, I would consider this an acute C. Difff infection. Will start Dificid tonight.  11-11-2023 continue with Dificid. Only has received 2 doses thus far. If no decrease after 4 doses, may need to change to po vanco. Follow WBC. Will check CT abd/pelvis to evaluate for toxic megacolon. Pt does not have a tender abd.  Acute metabolic acidosis 11-11-2023 start po bicarb 1300 mg bid. Repeat BMP in AM.  Sepsis (HCC) 11-10-2023 likely due to C. Diff. Present on admission. Had leukocytosis of 30K, lactic acid of 3.7, HR 124.  Acute metabolic encephalopathy-resolved as of 11/11/2023 11-10-2023 due to C. Diff infection. Now resolved. Mentation back to normal.  Hypomagnesemia 11-10-2023 replete with IV Mg. Repeat Mg in AM.  His c. Diff diarrhea could certainly cause massive loss of K and Mg  11-11-2023 resolved with repletion. Mg today of 2.2  Gout 11-10-2023 stable.  11-11-2023 stable  Lumbar compression fracture (HCC) 11-10-2023 stable.  11-11-2023 stable  CKD stage 3b, GFR 30-44 ml/min (HCC) 11-10-2023 baseline Scr 1.3-1.6  11-11-2023 stable. Scr 1.42 today.  Nausea and vomiting 11-10-2023 stable.  11-11-2023 resolved.  Transient hypotension 11-10-2023 stable. Off midodrine.  Chronic heart failure with preserved ejection fraction (HFpEF) (HCC) 11-10-2023 stable. Holding lasix  for now due to volume depletion on admission.  11-11-2023 stable. Continue to hold lasix .  Hypokalemia 11-10-2023 replete with IV Kcl and IV Mg. Repeat BMP and Mg in AM. His c. Diff diarrhea could certainly cause massive loss of K and Mg  11-11-2023 resolved after  repletion.  PAF (paroxysmal atrial fibrillation) (HCC) 11-10-2023 stable. Continue Eliquis . Restart Toprol   11-11-2023 stable. Continue Eliquis  and Toprol . If CT abd shows toxic megacolon, will need to stop Eliquis .  Morbid obesity (HCC) 11-10-2023 Body mass index is 40.27 kg/m.   OSA (obstructive sleep apnea) 11-10-2023 stable.  11-11-2023 stable  Type 2 diabetes mellitus (HCC) 11-10-2023 on SSI. Holding metformin .  Thrombocytosis-resolved as of 11/11/2023 11-10-2023 likely due to c. Diff infection. Repeat CBC in AM. 11-11-2023 resolved. Plt down to 394K today.  DVT prophylaxis: apixaban  (ELIQUIS ) tablet 2.5 mg Start: 11/09/23 1200 apixaban  (ELIQUIS ) tablet 2.5 mg     Code Status: Limited: Do not attempt resuscitation (DNR) -DNR-LIMITED -Do Not Intubate/DNI  Family Communication: no family at bedside. Spoke with pt's dtr yesterday at bedside Disposition Plan: SNF Reason for continuing need for hospitalization: ongoing treatment for C. Diff. CT abd/pelvis ordered for today.  Objective: Vitals:   11/10/23 2346 11/11/23 0022 11/11/23 0601 11/11/23  0801  BP:  129/69 101/60 121/63  Pulse:  (!) 109 (!) 102 88  Resp: 16 16 18 17   Temp:  98.5 F (36.9 C) 97.8 F (36.6 C) 97.6 F (36.4 C)  TempSrc:  Oral Oral Oral  SpO2: 97% 95% 95% 95%  Weight:   124.1 kg   Height:        Intake/Output Summary (Last 24 hours) at 11/11/2023 1219 Last data filed at 11/11/2023 0604 Gross per 24 hour  Intake 731.58 ml  Output 1200 ml  Net -468.42 ml   Filed Weights   11/09/23 0010 11/11/23 0601  Weight: 123.7 kg 124.1 kg    Examination:  Physical Exam Vitals and nursing note reviewed.  Constitutional:      General: He is not in acute distress.    Appearance: He is obese. He is not toxic-appearing.  HENT:     Head: Normocephalic and atraumatic.     Nose: Nose normal.   Eyes:     General: No scleral icterus.   Cardiovascular:     Rate and Rhythm: Normal rate. Rhythm  irregular.  Pulmonary:     Effort: Pulmonary effort is normal. No respiratory distress.     Breath sounds: Normal breath sounds.  Abdominal:     General: There is no distension.     Palpations: Abdomen is soft.     Tenderness: There is no abdominal tenderness.   Skin:    General: Skin is warm and dry.     Capillary Refill: Capillary refill takes less than 2 seconds.   Neurological:     Mental Status: He is alert and oriented to person, place, and time.     Data Reviewed: I have personally reviewed following labs and imaging studies  CBC: Recent Labs  Lab 11/09/23 0058 11/10/23 0535 11/11/23 0413  WBC 30.3* 20.0* 25.8*  NEUTROABS 25.6*  --  20.8*  HGB 15.1 11.8* 12.6*  HCT 45.5 36.5* 38.9*  MCV 82.1 83.7 84.9  PLT 595* 437* 394   Basic Metabolic Panel: Recent Labs  Lab 11/09/23 0058 11/10/23 0535 11/10/23 1400 11/11/23 0413  NA 139 138  --  138  K 2.7* 3.1*  --  4.1  CL 103 109  --  114*  CO2 20* 20*  --  11*  GLUCOSE 191* 155*  --  125*  BUN 32* 25*  --  21  CREATININE 1.80* 1.55*  --  1.42*  CALCIUM  8.6* 8.1*  --  7.8*  MG  --   --  1.6* 2.2   GFR: Estimated Creatinine Clearance: 53.1 mL/min (A) (by C-G formula based on SCr of 1.42 mg/dL (H)). Liver Function Tests: Recent Labs  Lab 11/09/23 0058 11/10/23 0535  AST 20 16  ALT 18 13  ALKPHOS 149* 94  BILITOT 0.6 0.9  PROT 5.9* 4.6*  ALBUMIN  2.8* 2.1*   Coagulation Profile: Recent Labs  Lab 11/09/23 0058  INR 1.2   BNP (last 3 results) Recent Labs    09/03/23 2103 10/24/23 1123 11/09/23 1048  BNP 152.3* 411.2* 187.8*   CBG: Recent Labs  Lab 11/10/23 1351 11/10/23 1625 11/10/23 2156 11/11/23 0039 11/11/23 0805  GLUCAP 119* 129* 144* 134* 135*   Sepsis Labs: Recent Labs  Lab 11/09/23 0510 11/09/23 0825 11/09/23 0910 11/09/23 1048 11/09/23 1312  PROCALCITON  --   --   --  0.16  --   LATICACIDVEN 4.4* 2.6* 2.4*  --  1.8    Recent Results (from the past 240  hours)  Blood  Culture (routine x 2)     Status: None (Preliminary result)   Collection Time: 11/09/23 12:10 AM   Specimen: BLOOD  Result Value Ref Range Status   Specimen Description BLOOD BLOOD LEFT FOREARM  Final   Special Requests   Final    BOTTLES DRAWN AEROBIC AND ANAEROBIC Blood Culture results may not be optimal due to an inadequate volume of blood received in culture bottles   Culture   Final    NO GROWTH 2 DAYS Performed at [facility], [address], [place], [place] [address]    Report Status PENDING  Incomplete  Blood Culture (routine x 2)     Status: None (Preliminary result)   Collection Time: 11/09/23 12:26 AM   Specimen: BLOOD  Result Value Ref Range Status   Specimen Description BLOOD BLOOD RIGHT ARM AEROBIC BOTTLE ONLY  Final   Special Requests   Final    BOTTLES DRAWN AEROBIC ONLY Blood Culture adequate volume   Culture   Final    NO GROWTH 2 DAYS Performed at [facility], [address], [place], [place] [address]    Report Status PENDING  Incomplete  C Difficile Quick Screen w PCR reflex     Status: Abnormal   Collection Time: 11/10/23  3:45 PM   Specimen: STOOL  Result Value Ref Range Status   C Diff antigen POSITIVE (A) NEGATIVE Final   C Diff toxin NEGATIVE NEGATIVE Final   C Diff interpretation Results are indeterminate. See PCR results.  Final    Comment: Performed at [facility], [address] [place], [place] [address]  C. Diff by PCR, Reflexed     Status: Abnormal   Collection Time: 11/10/23  3:45 PM  Result Value Ref Range Status   Toxigenic C. Difficile by PCR POSITIVE (A) NEGATIVE Final    Comment: Positive for toxigenic C. difficile with little to no toxin production. Only treat if clinical presentation suggests symptomatic illness. Performed at [facility], [address] [place], [place] [address]      Radiology Studies: No results found.  Scheduled Meds:  allopurinol   100 mg Oral q AM   apixaban   2.5 mg Oral BID    docusate sodium   100 mg Oral BID   ezetimibe   10 mg Oral q AM   fidaxomicin  200 mg Oral BID   FLUoxetine   10 mg Oral QPM   insulin  aspart  0-9 Units Subcutaneous TID WC   metoprolol  succinate  50 mg Oral BID   midodrine  5 mg Oral TID WC   pantoprazole   40 mg Oral Daily   pregabalin   50 mg Oral BID   senna  1 tablet Oral QHS   sodium bicarbonate  1,300 mg Oral BID   sodium chloride  flush  3 mL Intravenous Q12H   traZODone   25 mg Oral QHS   Continuous Infusions:   LOS: 2 days   Time spent: 50 minutes  Unk Garb, DO  [facility]  11/11/2023, 12:19 PM

## 2023-11-11 NOTE — Progress Notes (Signed)
   11/10/23 2151  Assess: MEWS Score  Temp 98.7 F (37.1 C)  BP 130/82  MAP (mmHg) 93  Pulse Rate (!) 121  ECG Heart Rate (!) 120  Resp 20  Level of Consciousness Alert  SpO2 93 %  O2 Device Room Air  Assess: MEWS Score  MEWS Temp 0  MEWS Systolic 0  MEWS Pulse 2  MEWS RR 0  MEWS LOC 0  MEWS Score 2  MEWS Score Color Yellow  Assess: if the MEWS score is Yellow or Red  Were vital signs accurate and taken at a resting state? Yes  Does the patient meet 2 or more of the SIRS criteria? No  Does the patient have a confirmed or suspected source of infection? No  MEWS guidelines implemented  Yes, yellow (patient has been tachycardia afib)  Treat  MEWS Interventions Considered administering scheduled or prn medications/treatments as ordered  Take Vital Signs  Increase Vital Sign Frequency  Yellow: Q2hr x1, continue Q4hrs until patient remains green for 12hrs  Escalate  MEWS: Escalate Yellow: Discuss with charge nurse and consider notifying provider and/or RRT  Notify: Charge Nurse/RN  Name of Charge Nurse/RN Notified Lawyer  Assess: SIRS CRITERIA  SIRS Temperature  0  SIRS Respirations  0  SIRS Pulse 1  SIRS WBC 0  SIRS Score Sum  1

## 2023-11-11 NOTE — Plan of Care (Signed)
   Problem: Coping: Goal: Level of anxiety will decrease Outcome: Progressing

## 2023-11-11 NOTE — Evaluation (Signed)
 Occupational Therapy Evaluation Patient Details Name: Steve Jensen MRN: [id number] DOB: 12-25-1940 Today's Date: 11/11/2023   History of Present Illness   Patient is 83 yr old male presenting to ED from [facility] rehab after vomiting with concerns for acute respiratory distress to follow. Pt was recently admitted to the hospital 5/31-6/6 with AMS after a recent fall, resulting in a L4 compression fracture; LSO brace recommended for management. PMH significant for a fib, HTN, chronic HF, OSA on CPAP, DM II, CKD III, GERD, hepatocellular carcinoma, prostate cancer.     Clinical Impressions The pt is currently presenting with the below listed deficits (see OT problem list). As such, his occupational performance is impaired and he requires assistance for self-care management. During the session today, he was also noted to be with impaired memory, deconditioning, and generalized weakness. He required Geoff assist to roll in bed and total assist for lower body dressing. He will benefit from further OT services to maximize his safety and independence with self-care tasks and to decrease the risk for further weakness and deconditioning. Patient will benefit from continued inpatient follow up therapy, <3 hours/day.      If plan is discharge home, recommend the following:   A lot of help with bathing/dressing/bathroom;Direct supervision/assist for medications management;Direct supervision/assist for financial management;Assist for transportation;Help with stairs or ramp for entrance;Two people to help with walking and/or transfers;Assistance with cooking/housework     Functional Status Assessment   Patient has had a recent decline in their functional status and demonstrates the ability to make significant improvements in function in a reasonable and predictable amount of time.     Equipment Recommendations   Other (comment) (defer to next level of care)     Recommendations for Other  Services         Precautions/Restrictions   Precautions Precautions: Back;Fall Recall of Precautions/Restrictions: Impaired Required Braces or Orthoses: Spinal Brace Spinal Brace: Lumbar corset Restrictions Weight Bearing Restrictions Per Provider Order: No     Mobility Bed Mobility Overal bed mobility: Needs Assistance Bed Mobility: Rolling Rolling: Coal assist, Used rails                      ADL either performed or assessed with clinical judgement   ADL Overall ADL's : Needs assistance/impaired Eating/Feeding: Set up;Bed level   Grooming: Minimal assistance;Bed level           Upper Body Dressing : Minimal assistance;Bed level   Lower Body Dressing: Total assistance;Bed level       Toileting- Clothing Manipulation and Hygiene: Maximal assistance;Bed level Toileting - Clothing Manipulation Details (indicate cue type and reason): based on clinical judgement             Vision   Additional Comments: He correctly read the time depicted on the wall clock.            Pertinent Vitals/Pain Pain Assessment Pain Assessment: No/denies pain     Extremity/Trunk Assessment Upper Extremity Assessment Upper Extremity Assessment: Right hand dominant;LUE deficits/detail;RUE deficits/detail RUE Deficits / Details: - LUE Deficits / Details: AROM WFL. Grip strength 4-5   Lower Extremity Assessment Lower Extremity Assessment: Generalized weakness       Communication     Cognition Arousal: Alert Behavior During Therapy: Flat affect Cognition: No family/caregiver present to determine baseline         Attention impairment (select first level of impairment): Alternating attention Executive functioning impairment (select all impairments): Problem solving OT - Cognition  Comments: Oriented to person, place, month, and year. Slightly delayed initiation of tasks. Impaired memory noted.                   Following commands impaired: Follows one  step commands with increased time                Home Living Family/patient expects to be discharged to:: Assisted living      Additional Comments: The pt's medical chart indicates the pt resides at [facility], [facility]. He was admitted to the hospital from [place], [place] where he was receiving short-term rehab. The pt was unable to recall this, as he stated he lives with his son, daughter-in-law, and two young grandsons      Prior Functioning/Environment Prior Level of Function : Patient poor historian/Family not available             Mobility Comments: Prior PT note which contains info reported by the pt's family indicates the pt was modified independent with ambulation using a RW, prior to a fall a couple weeks ago. Since being at the SNF for rehab, he has not ambulated and required +2 assist to transfer to the EOB. ADLs Comments: Per PT note which contains info reported by the pt's family, the pt has been requiring assist from the SNF staff to manage bathing, dressing, and toileting.    OT Problem List: Decreased strength;Decreased activity tolerance;Impaired balance (sitting and/or standing);Decreased cognition;Decreased safety awareness;Decreased knowledge of use of DME or AE;Decreased knowledge of precautions   OT Treatment/Interventions: Self-care/ADL training;Therapeutic exercise;DME and/or AE instruction;Cognitive remediation/compensation;Patient/family education;Balance training;Therapeutic activities;Energy conservation      OT Goals(Current goals can be found in the care plan section)   Acute Rehab OT Goals OT Goal Formulation: With patient Time For Goal Achievement: 11/25/23 Potential to Achieve Goals: Good ADL Goals Pt Will Perform Grooming: with set-up;sitting Pt Will Perform Upper Body Dressing: with set-up;with supervision;sitting Pt Will Transfer to Toilet: with contact guard assist;stand pivot transfer;bedside commode Additional ADL Goal #1: The pt will  perform bed mobility with CGA, in prep for progressive ADL participation.   OT Frequency:  Min 2X/week       AM-PAC OT 6 Clicks Daily Activity     Outcome Measure Help from another person eating meals?: A Little Help from another person taking care of personal grooming?: A Little Help from another person toileting, which includes using toliet, bedpan, or urinal?: A Lot Help from another person bathing (including washing, rinsing, drying)?: A Lot Help from another person to put on and taking off regular upper body clothing?: A Little Help from another person to put on and taking off regular lower body clothing?: Total 6 Click Score: 14   End of Session Equipment Utilized During Treatment: Other (comment) (N/A) Nurse Communication: Mobility status  Activity Tolerance: Patient tolerated treatment well Patient left: in bed;with call bell/phone within reach;with bed alarm set  OT Visit Diagnosis: Other abnormalities of gait and mobility (R26.89);Muscle weakness (generalized) (M62.81);History of falling (Z91.81);Other symptoms and signs involving cognitive function                Time: 1016-1035 OT Time Calculation (min): 19 min Charges:  OT General Charges $OT Visit: 1 Visit OT Evaluation $OT Eval Moderate Complexity: 1 Mod    Esker Dever L Mykael Trott, OTR/L 11/11/2023, 12:08 PM

## 2023-11-12 DIAGNOSIS — A419 Sepsis, unspecified organism: Secondary | ICD-10-CM | POA: Diagnosis not present

## 2023-11-12 DIAGNOSIS — E872 Acidosis, unspecified: Secondary | ICD-10-CM | POA: Diagnosis not present

## 2023-11-12 DIAGNOSIS — E8721 Acute metabolic acidosis: Secondary | ICD-10-CM | POA: Diagnosis not present

## 2023-11-12 DIAGNOSIS — A0472 Enterocolitis due to Clostridium difficile, not specified as recurrent: Secondary | ICD-10-CM | POA: Diagnosis not present

## 2023-11-12 LAB — CBC WITH DIFFERENTIAL/PLATELET
Abs Immature Granulocytes: 0.22 10*3/uL — ABNORMAL HIGH (ref 0.00–0.07)
Basophils Absolute: 0.1 10*3/uL (ref 0.0–0.1)
Basophils Relative: 0 %
Eosinophils Absolute: 0.3 10*3/uL (ref 0.0–0.5)
Eosinophils Relative: 2 %
HCT: 36.9 % — ABNORMAL LOW (ref 39.0–52.0)
Hemoglobin: 12 g/dL — ABNORMAL LOW (ref 13.0–17.0)
Immature Granulocytes: 1 %
Lymphocytes Relative: 11 %
Lymphs Abs: 1.8 10*3/uL (ref 0.7–4.0)
MCH: 26.5 pg (ref 26.0–34.0)
MCHC: 32.5 g/dL (ref 30.0–36.0)
MCV: 81.6 fL (ref 80.0–100.0)
Monocytes Absolute: 1.8 10*3/uL — ABNORMAL HIGH (ref 0.1–1.0)
Monocytes Relative: 11 %
Neutro Abs: 11.9 10*3/uL — ABNORMAL HIGH (ref 1.7–7.7)
Neutrophils Relative %: 75 %
Platelets: 439 10*3/uL — ABNORMAL HIGH (ref 150–400)
RBC: 4.52 MIL/uL (ref 4.22–5.81)
RDW: 17.9 % — ABNORMAL HIGH (ref 11.5–15.5)
WBC: 16.2 10*3/uL — ABNORMAL HIGH (ref 4.0–10.5)
nRBC: 0 % (ref 0.0–0.2)

## 2023-11-12 LAB — BASIC METABOLIC PANEL WITH GFR
Anion gap: 9 (ref 5–15)
BUN: 19 mg/dL (ref 8–23)
CO2: 20 mmol/L — ABNORMAL LOW (ref 22–32)
Calcium: 8.2 mg/dL — ABNORMAL LOW (ref 8.9–10.3)
Chloride: 110 mmol/L (ref 98–111)
Creatinine, Ser: 1.34 mg/dL — ABNORMAL HIGH (ref 0.61–1.24)
GFR, Estimated: 53 mL/min — ABNORMAL LOW (ref >60)
Glucose, Bld: 134 mg/dL — ABNORMAL HIGH (ref 70–99)
Potassium: 2.7 mmol/L — CL (ref 3.5–5.1)
Sodium: 139 mmol/L (ref 135–145)

## 2023-11-12 LAB — GLUCOSE, CAPILLARY
Glucose-Capillary: 124 mg/dL — ABNORMAL HIGH (ref 70–99)
Glucose-Capillary: 105 mg/dL — ABNORMAL HIGH (ref 70–99)
Glucose-Capillary: 121 mg/dL — ABNORMAL HIGH (ref 70–99)
Glucose-Capillary: 120 mg/dL — ABNORMAL HIGH (ref 70–99)

## 2023-11-12 LAB — MAGNESIUM: Magnesium: 2 mg/dL (ref 1.7–2.4)

## 2023-11-12 LAB — LACTIC ACID, PLASMA: Lactic Acid, Venous: 1 mmol/L (ref 0.5–1.9)

## 2023-11-12 MED ORDER — ENSURE MAX PROTEIN PO LIQD: 11.0000 [oz_av] | Freq: Two times a day (BID) | ORAL | Status: DC | PRN

## 2023-11-12 MED ORDER — ACETAMINOPHEN 500 MG PO TABS: 1000.0000 mg | ORAL_TABLET | Freq: Four times a day (QID) | ORAL | Status: DC | PRN

## 2023-11-12 MED ORDER — ACETAMINOPHEN 650 MG RE SUPP: 650.0000 mg | Freq: Four times a day (QID) | RECTAL | Status: DC | PRN

## 2023-11-12 MED ORDER — LACTATED RINGERS IV SOLN: INTRAVENOUS | Status: AC

## 2023-11-12 MED ORDER — POTASSIUM CHLORIDE 10 MEQ/100ML IV SOLN
10.0000 meq | INTRAVENOUS | Status: AC
  Administered 2023-11-12 (×6): 10 meq via INTRAVENOUS
  Filled 2023-11-12 (×6): qty 100

## 2023-11-12 NOTE — TOC Progression Note (Signed)
 Transition of Care [facility]) - Progression Note    Patient Details  Name: Steve Jensen MRN: [id number] Date of Birth: 11/29/1940  [facility] CM/SW Contact  Carmon Christen, LCSWA Phone Number: 11/12/2023, 1:50 PM  Clinical Narrative:     Patient has SNF bed at [facility] when medically stable. CSW following to start insurance authorization closer to patient being medically stable.  Expected Discharge Plan: Skilled Nursing Facility Barriers to Discharge: Continued Medical Work up  Expected Discharge Plan and Services In-house Referral: Clinical Social Work     Living arrangements for the past 2 months:  (from [place] short term)                                       Social Determinants of Health (SDOH) Interventions SDOH Screenings   Food Insecurity: No Food Insecurity (11/10/2023)  Housing: Low Risk  (11/10/2023)  Transportation Needs: No Transportation Needs (11/10/2023)  Utilities: Not At Risk (11/10/2023)  Social Connections: Socially Isolated (11/10/2023)  Tobacco Use: Medium Risk (11/10/2023)    Readmission Risk Interventions    12/26/2021   12:51 PM 12/25/2021    3:31 PM 12/19/2021   12:39 PM  Readmission Risk Prevention Plan  Transportation Screening  Complete Complete  PCP or Specialist Appt within 5-7 Days Complete    Home Care Screening  Complete   Medication Review (RN CM)  Complete   HRI or Home Care Consult   Complete  Social Work Consult for Recovery Care Planning/Counseling   Complete  Palliative Care Screening   Not Applicable  Medication Review Oceanographer)   Complete

## 2023-11-12 NOTE — Progress Notes (Signed)
   11/12/23 2107  BiPAP/CPAP/SIPAP  $ Non-Invasive Ventilator  Non-Invasive Vent Subsequent  BiPAP/CPAP/SIPAP Pt Type Adult  BiPAP/CPAP/SIPAP Resmed  Reason BIPAP/CPAP not in use Non-compliant  BiPAP/CPAP /SiPAP Vitals  Pulse Rate 91  Resp 16  SpO2 96 %  Bilateral Breath Sounds Clear;Diminished  MEWS Score/Color  MEWS Score 0  MEWS Score Color Green   Pt refused and states he will bring his own CPAP for usage.

## 2023-11-12 NOTE — Progress Notes (Addendum)
 Date and time results received: 11/12/23 0530 (use smartphrase .now to insert current time)  Test: K+ Critical Value: 2.7  Name of Provider Notified: A.Dezii MD   Orders Received? Or Actions Taken?: Potassium chloride  runs x6

## 2023-11-12 NOTE — Progress Notes (Signed)
 PROGRESS NOTE    Steve Jensen  ZOX:[id number] DOB: 1940/09/05 DOA: 11/08/2023 PCP: Glena Landau, MD  Subjective: Pt seen and examined.  Pt with rectal tube now due to liquid diarrhea. Metabolic acidosis improved with IVF and po bicarb. Still profoundly hypokalemic. IV Kcl ordered. Not surprising given on-going diarrhea. Mg level fortunately stable.  Fortunately, WBC down to 16K. CT abd yesterday negative for toxic megacolon.  Pt not hungry. I helped him order some cereal and sausage for breakfast.   Hospital Course: HPI: Steve Jensen is a 83 y.o. male with medical history significant of PAF on Eliquis , hypertension, chronic HFpEF, OSA on CPAP, obesity, type 2 diabetes, CKD stage IIIa, GERD, hepatocellular carcinoma seen by IR, history of prostate cancer, falls who presents after having acute respiratory distress.  History is obtained from review of records as patient is confused currently and states that he is here due to a sign.   Records note patient was being laid flat to be changed at which point he had sudden onset of emesis.  After vomiting patient turned blue and appeared to be choking with difficulty breathing and possibly turned blue.  He was noted to have O2 saturations as low as 86% for which she was placed on 2 L of oxygen  and EMS called to transport to the hospital.   He had just recently been hospitalized 5/31-6/6 for altered mental status after having a fall due to weakness with reported low blood pressures.  Patient was noted not to have significant orthostatic hypotension when checked.  Patient was found to have a acute compression fracture of L4 for which patient was recommended LSO brace and outpatient follow-up.   Over the phone the patient's son makes note that they had just recently seen his father yesterday and he was doing well and seemed alert and oriented.  He does admit that his father has intermittently gotten confused following the falls during prior  hospitalizations.   In the emergency department patient was noted to be afebrile with heart rates elevated up to 124 in atrial fibrillation, blood pressures noted to be as low as 88/53, and O2 saturations currently maintained on 2 L of nasal cannula oxygen .  Labs significant for WBC 32.3, platelets 595, potassium 2.7, BUN 32, creatinine 1.8, calcium  8.6, anion gap 16, and lactic acid 3.7-> 4.4-> 2.6.  Chest x-ray that showed no acute abnormality.  Urinalysis did not show significant signs for infection. Blood cultures were obtained.  Patient was given 4 L lactated Ringer 's, potassium chloride  40 meq p.o., vancomycin , metronidazole, and cefepime .  Significant Events: Admitted 11/08/2023 for lactic acidosis and SIRS   Admission Labs: Lactic acid 3.7 WBC 30.3, HgB 15.1, plt 595 Na 139, K 2.7, CO2 of 20, BUN 32, Scr 1.80, glu 191 UA negative nitrite, negative LE, rare bacteria BNP 187 Procal 0.16  Admission Imaging Studies: CXR No acute findings.   Significant Labs:   Significant Imaging Studies:   Antibiotic Therapy: Anti-infectives (From admission, onward)    Start     Dose/Rate Route Frequency Ordered Stop   11/10/23 0400  vancomycin  (VANCOCIN ) IVPB 1000 mg/200 mL premix        1,000 mg 200 mL/hr over 60 Minutes Intravenous Every 24 hours 11/09/23 1228     11/09/23 1500  ceFEPIme  (MAXIPIME ) 2 g in sodium chloride  0.9 % 100 mL IVPB        2 g 200 mL/hr over 30 Minutes Intravenous Every 12 hours 11/09/23 1127  11/09/23 0145  vancomycin  (VANCOREADY) IVPB 2000 mg/400 mL        2,000 mg 200 mL/hr over 120 Minutes Intravenous  Once 11/09/23 0132 11/09/23 0449   11/09/23 0130  ceFEPIme  (MAXIPIME ) 2 g in sodium chloride  0.9 % 100 mL IVPB        2 g 200 mL/hr over 30 Minutes Intravenous  Once 11/09/23 0125 11/09/23 0323   11/09/23 0130  metroNIDAZOLE (FLAGYL) IVPB 500 mg        500 mg 100 mL/hr over 60 Minutes Intravenous  Once 11/09/23 0125 11/09/23 0245   11/09/23 0130   vancomycin  (VANCOCIN ) IVPB 1000 mg/200 mL premix  Status:  Discontinued        1,000 mg 200 mL/hr over 60 Minutes Intravenous  Once 11/09/23 0125 11/09/23 0132       Procedures:   Consultants:     Assessment and Plan: * Lactic acidosis 11-10-2023 likely cause by C. Diff.  11-12-2023 resolved  C. difficile diarrhea 11-10-2023 pt without any other source for his leukocytosis. Dtr mentions that pt has had diarrhea for several days. C. Diff PCR positive. Although C diff toxin was negative, given that his has a dramatic leukocytosis without any other localizing source, I would consider this an acute C. Difff infection. Will start Dificid tonight.  11-11-2023 continue with Dificid. Only has received 2 doses thus far. If no decrease after 4 doses, may need to change to po vanco. Follow WBC. Will check CT abd/pelvis to evaluate for toxic megacolon. Pt does not have a tender abd.  11-12-2023 Pt with rectal tube now due to liquid diarrhea. Metabolic acidosis improved with IVF and po bicarb. Still profoundly hypokalemic. IV Kcl ordered. Not surprising given on-going diarrhea. Mg level fortunately stable.  Fortunately, WBC down to 16K. CT abd yesterday negative for toxic megacolon.  Order Ensure Renny TID to help get protein into him. Repeat Cbc in AM  Acute metabolic acidosis 11-11-2023 start po bicarb 1300 mg bid. Repeat BMP in AM.  11-12-2023 improved with IV and po bicarb. Continue with both while he still has on-going diarrhea. Repeat BMP in aM.  Sepsis (HCC) 11-10-2023 likely due to C. Diff. Present on admission. Had leukocytosis of 30K, lactic acid of 3.7, HR 124.  Acute metabolic encephalopathy-resolved as of 11/11/2023 11-10-2023 due to C. Diff infection. Now resolved. Mentation back to normal.  Gout 11-10-2023 stable.  11-11-2023 stable  11-12-2023 stable  Lumbar compression fracture (HCC) 11-10-2023 stable.  11-11-2023 stable  11-12-2023 stable  CKD stage 3b, GFR 30-44  ml/min (HCC) 11-10-2023 baseline Scr 1.3-1.6  11-11-2023 stable. Scr 1.42 today.  11-12-2023 stable. Scr 1.34 today.  Chronic heart failure with preserved ejection fraction (HFpEF) (HCC) 11-10-2023 stable. Holding lasix  for now due to volume depletion on admission.  11-11-2023 stable. Continue to hold lasix .  11-12-2023 continue to hold lasix . On IVF for continued diarrhea.  Hypokalemia 11-10-2023 replete with IV Kcl and IV Mg. Repeat BMP and Mg in AM. His c. Diff diarrhea could certainly cause massive loss of K and Mg  11-11-2023 resolved after repletion.  11-12-2023 continue with IV Kcl replacement. Ongoing enteral losses due to diarrhea from C. Diff.  PAF (paroxysmal atrial fibrillation) (HCC) 11-10-2023 stable. Continue Eliquis . Restart Toprol   11-11-2023 stable. Continue Eliquis  and Toprol . If CT abd shows toxic megacolon, will need to stop Eliquis .  11-12-2023 stable. Continue eliquis  and Toprol -XL.  Morbid obesity (HCC) 11-10-2023 Body mass index is 40.27 kg/m.   OSA (obstructive sleep apnea) 11-10-2023 stable.  11-11-2023 stable  11-12-2023 stable  Type 2 diabetes mellitus (HCC) 11-10-2023 on SSI. Holding metformin .  11-12-2023 CBG acceptable. Continue with SSI.  Hypomagnesemia-resolved as of 11/12/2023 11-10-2023 replete with IV Mg. Repeat Mg in AM.  His c. Diff diarrhea could certainly cause massive loss of K and Mg  11-11-2023 resolved with repletion. Mg today of 2.2  Thrombocytosis-resolved as of 11/11/2023 11-10-2023 likely due to c. Diff infection. Repeat CBC in AM. 11-11-2023 resolved. Plt down to 394K today.  Nausea and vomiting-resolved as of 11/12/2023 11-10-2023 stable.  11-11-2023 resolved.  Transient hypotension-resolved as of 11/12/2023 11-10-2023 stable. Off midodrine.  11-12-2023 resolved.  DVT prophylaxis: apixaban  (ELIQUIS ) tablet 2.5 mg Start: 11/09/23 1200 apixaban  (ELIQUIS ) tablet 2.5 mg     Code Status: Limited: Do not  attempt resuscitation (DNR) -DNR-LIMITED -Do Not Intubate/DNI  Family Communication: no family at bedside Disposition Plan: SNF Reason for continuing need for hospitalization: remains on IVF and treatment for C. Diff. Receiving IV Kcl.  Objective: Vitals:   11/12/23 0440 11/12/23 0500 11/12/23 0618 11/12/23 0800  BP: 94/64   129/77  Pulse:      Resp: 18   16  Temp: 97.9 F (36.6 C)   (!) 97.5 F (36.4 C)  TempSrc: Oral   Oral  SpO2:      Weight:  90 kg 90 kg   Height:        Intake/Output Summary (Last 24 hours) at 11/12/2023 1128 Last data filed at 11/12/2023 0807 Gross per 24 hour  Intake 1192.69 ml  Output 1050 ml  Net 142.69 ml   Filed Weights   11/11/23 0601 11/12/23 0500 11/12/23 0618  Weight: 124.1 kg 90 kg 90 kg    Examination:  Physical Exam Vitals and nursing note reviewed.  Constitutional:      General: He is not in acute distress.    Appearance: He is obese. He is not toxic-appearing or diaphoretic.  HENT:     Head: Normocephalic and atraumatic.     Nose: Nose normal.   Eyes:     General: No scleral icterus.   Cardiovascular:     Rate and Rhythm: Normal rate. Rhythm irregular.  Pulmonary:     Effort: Pulmonary effort is normal.     Breath sounds: Normal breath sounds.  Abdominal:     General: There is no distension.     Palpations: Abdomen is soft.     Tenderness: There is no abdominal tenderness. There is no guarding or rebound.   Skin:    General: Skin is warm and dry.     Capillary Refill: Capillary refill takes less than 2 seconds.   Neurological:     General: No focal deficit present.     Mental Status: He is alert and oriented to person, place, and time.     Data Reviewed: I have personally reviewed following labs and imaging studies  CBC: Recent Labs  Lab 11/09/23 0058 11/10/23 0535 11/11/23 0413 11/12/23 0404  WBC 30.3* 20.0* 25.8* 16.2*  NEUTROABS 25.6*  --  20.8* 11.9*  HGB 15.1 11.8* 12.6* 12.0*  HCT 45.5 36.5* 38.9*  36.9*  MCV 82.1 83.7 84.9 81.6  PLT 595* 437* 394 439*   Basic Metabolic Panel: Recent Labs  Lab 11/09/23 0058 11/10/23 0535 11/10/23 1400 11/11/23 0413 11/12/23 0404  NA 139 138  --  138 139  K 2.7* 3.1*  --  4.1 2.7*  CL 103 109  --  114* 110  CO2 20* 20*  --  11* 20*  GLUCOSE 191* 155*  --  125* 134*  BUN 32* 25*  --  21 19  CREATININE 1.80* 1.55*  --  1.42* 1.34*  CALCIUM  8.6* 8.1*  --  7.8* 8.2*  MG  --   --  1.6* 2.2 2.0   GFR: Estimated Creatinine Clearance: 47.9 mL/min (A) (by C-G formula based on SCr of 1.34 mg/dL (H)). Liver Function Tests: Recent Labs  Lab 11/09/23 0058 11/10/23 0535  AST 20 16  ALT 18 13  ALKPHOS 149* 94  BILITOT 0.6 0.9  PROT 5.9* 4.6*  ALBUMIN  2.8* 2.1*   Coagulation Profile: Recent Labs  Lab 11/09/23 0058  INR 1.2   BNP (last 3 results) Recent Labs    09/03/23 2103 10/24/23 1123 11/09/23 1048  BNP 152.3* 411.2* 187.8*   CBG: Recent Labs  Lab 11/11/23 0805 11/11/23 1230 11/11/23 1627 11/11/23 2017 11/12/23 0820  GLUCAP 135* 121* 171* 137* 124*   Sepsis Labs: Recent Labs  Lab 11/09/23 0825 11/09/23 0910 11/09/23 1048 11/09/23 1312 11/12/23 0404  PROCALCITON  --   --  0.16  --   --   LATICACIDVEN 2.6* 2.4*  --  1.8 1.0    Recent Results (from the past 240 hours)  Blood Culture (routine x 2)     Status: None (Preliminary result)   Collection Time: 11/09/23 12:10 AM   Specimen: BLOOD  Result Value Ref Range Status   Specimen Description BLOOD BLOOD LEFT FOREARM  Final   Special Requests   Final    BOTTLES DRAWN AEROBIC AND ANAEROBIC Blood Culture results may not be optimal due to an inadequate volume of blood received in culture bottles   Culture   Final    NO GROWTH 3 DAYS Performed at [facility], [address] [place], [place] [address]    Report Status PENDING  Incomplete  Blood Culture (routine x 2)     Status: None (Preliminary result)   Collection Time: 11/09/23 12:26 AM   Specimen: BLOOD   Result Value Ref Range Status   Specimen Description BLOOD BLOOD RIGHT ARM AEROBIC BOTTLE ONLY  Final   Special Requests   Final    BOTTLES DRAWN AEROBIC ONLY Blood Culture adequate volume   Culture   Final    NO GROWTH 3 DAYS Performed at [facility], [address] [place], [place] [address]    Report Status PENDING  Incomplete  C Difficile Quick Screen w PCR reflex     Status: Abnormal   Collection Time: 11/10/23  3:45 PM   Specimen: STOOL  Result Value Ref Range Status   C Diff antigen POSITIVE (A) NEGATIVE Final   C Diff toxin NEGATIVE NEGATIVE Final   C Diff interpretation Results are indeterminate. See PCR results.  Final    Comment: Performed at [facility], [address], [place], [place] [address]  C. Diff by PCR, Reflexed     Status: Abnormal   Collection Time: 11/10/23  3:45 PM  Result Value Ref Range Status   Toxigenic C. Difficile by PCR POSITIVE (A) NEGATIVE Final    Comment: Positive for toxigenic C. difficile with little to no toxin production. Only treat if clinical presentation suggests symptomatic illness. Performed at [facility], [address] [place], [place] [address]      Radiology Studies: CT ABDOMEN PELVIS WO CONTRAST Result Date: 11/11/2023 EXAM: CT ABDOMEN AND PELVIS WITHOUT CONTRAST 11/11/2023 05:18:37 PM TECHNIQUE: CT of the  abdomen and pelvis was performed without the administration of intravenous contrast. Multiplanar reformatted images are provided for review. Automated exposure control, iterative reconstruction, and/or weight based adjustment of the mA/kV was utilized to reduce the radiation dose to as low as reasonably achievable. COMPARISON: MR abdomen dated 07/13/2023. CLINICAL HISTORY: Diarrhea; c. diff. evaluate for toxic megacolon. FINDINGS: LOWER CHEST: Mild patchy bilateral lower lobe opacities, favoring atelectasis. Trace bilateral pleural effusions. LIVER: Cirrhosis. Hepatic lesions on prior MR are poorly visualized  due to motion degradation and lack of contrast (image 32). GALLBLADDER AND BILE DUCTS: Gallbladder is unremarkable. No biliary ductal dilatation. SPLEEN: No acute abnormality. PANCREAS: No acute abnormality. ADRENAL GLANDS: No acute abnormality. KIDNEYS, URETERS AND BLADDER: Stable 2.5 cm renal adenoma (image 24), benign, no follow-up is recommended. No stones in the kidneys or ureters. No hydronephrosis. No perinephric or periureteral stranding. Urinary bladder is unremarkable. GI AND BOWEL: Small hiatal hernia. The appendix is not discretely visualized. No colonic wall thickening or inflammatory changes to suggest toxic megacolon. Mild sigmoid diverticulosis, without evidence of diverticulitis. PERITONEUM AND RETROPERITONEUM: No ascites. No free air. VASCULATURE: Atherosclerotic calcifications of the abdominal aorta and branch vessels. LYMPH NODES: No lymphadenopathy. REPRODUCTIVE ORGANS: Status post prostatectomy. BONES AND SOFT TISSUES: Mild superior endplate changes at L1 and L4, chronic. No acute osseous abnormality. No focal soft tissue abnormality. IMPRESSION: 1. No CT evidence of toxic megacolon. 2. Cirrhosis. Hepatic lesions on prior MR poorly visualized due to motion degradation and lack of contrast. 3. Additional ancillary findings as above. Electronically signed by: Zadie Herter MD 11/11/2023 08:01 PM EDT RP Workstation: [id number]    Scheduled Meds:  allopurinol   100 mg Oral q AM   apixaban   2.5 mg Oral BID   ezetimibe   10 mg Oral q AM   fidaxomicin  200 mg Oral BID   FLUoxetine   10 mg Oral QPM   insulin  aspart  0-9 Units Subcutaneous TID WC   metoprolol  succinate  50 mg Oral BID   pantoprazole   40 mg Oral Daily   pregabalin   50 mg Oral BID   sodium bicarbonate  1,300 mg Oral BID   sodium chloride  flush  3 mL Intravenous Q12H   traZODone   25 mg Oral QHS   Continuous Infusions:  lactated ringers  100 mL/hr at 11/12/23 0807   potassium chloride  10 mEq (11/12/23 1030)     LOS: 3  days   Time spent: 55 minutes  Unk Garb, DO  [facility]  11/12/2023, 11:28 AM

## 2023-11-13 DIAGNOSIS — E872 Acidosis, unspecified: Secondary | ICD-10-CM | POA: Diagnosis not present

## 2023-11-13 DIAGNOSIS — N1832 Chronic kidney disease, stage 3b: Secondary | ICD-10-CM | POA: Diagnosis not present

## 2023-11-13 DIAGNOSIS — A0472 Enterocolitis due to Clostridium difficile, not specified as recurrent: Secondary | ICD-10-CM | POA: Diagnosis not present

## 2023-11-13 DIAGNOSIS — E876 Hypokalemia: Secondary | ICD-10-CM | POA: Diagnosis not present

## 2023-11-13 LAB — CBC WITH DIFFERENTIAL/PLATELET
Abs Immature Granulocytes: 0.18 10*3/uL — ABNORMAL HIGH (ref 0.00–0.07)
Basophils Absolute: 0.1 10*3/uL (ref 0.0–0.1)
Basophils Relative: 1 %
Eosinophils Absolute: 0.3 10*3/uL (ref 0.0–0.5)
Eosinophils Relative: 2 %
HCT: 36.9 % — ABNORMAL LOW (ref 39.0–52.0)
Hemoglobin: 12.1 g/dL — ABNORMAL LOW (ref 13.0–17.0)
Immature Granulocytes: 1 %
Lymphocytes Relative: 15 %
Lymphs Abs: 2.2 10*3/uL (ref 0.7–4.0)
MCH: 27.2 pg (ref 26.0–34.0)
MCHC: 32.8 g/dL (ref 30.0–36.0)
MCV: 82.9 fL (ref 80.0–100.0)
Monocytes Absolute: 1.7 10*3/uL — ABNORMAL HIGH (ref 0.1–1.0)
Monocytes Relative: 12 %
Neutro Abs: 9.7 10*3/uL — ABNORMAL HIGH (ref 1.7–7.7)
Neutrophils Relative %: 69 %
Platelets: 429 10*3/uL — ABNORMAL HIGH (ref 150–400)
RBC: 4.45 MIL/uL (ref 4.22–5.81)
RDW: 18 % — ABNORMAL HIGH (ref 11.5–15.5)
WBC: 14.1 10*3/uL — ABNORMAL HIGH (ref 4.0–10.5)
nRBC: 0 % (ref 0.0–0.2)

## 2023-11-13 LAB — BASIC METABOLIC PANEL WITH GFR
Anion gap: 7 (ref 5–15)
BUN: 15 mg/dL (ref 8–23)
CO2: 20 mmol/L — ABNORMAL LOW (ref 22–32)
Calcium: 8.1 mg/dL — ABNORMAL LOW (ref 8.9–10.3)
Chloride: 111 mmol/L (ref 98–111)
Creatinine, Ser: 1.28 mg/dL — ABNORMAL HIGH (ref 0.61–1.24)
GFR, Estimated: 56 mL/min — ABNORMAL LOW (ref >60)
Glucose, Bld: 112 mg/dL — ABNORMAL HIGH (ref 70–99)
Potassium: 2.9 mmol/L — ABNORMAL LOW (ref 3.5–5.1)
Sodium: 138 mmol/L (ref 135–145)

## 2023-11-13 LAB — GLUCOSE, CAPILLARY
Glucose-Capillary: 109 mg/dL — ABNORMAL HIGH (ref 70–99)
Glucose-Capillary: 173 mg/dL — ABNORMAL HIGH (ref 70–99)
Glucose-Capillary: 181 mg/dL — ABNORMAL HIGH (ref 70–99)
Glucose-Capillary: 135 mg/dL — ABNORMAL HIGH (ref 70–99)

## 2023-11-13 LAB — MAGNESIUM: Magnesium: 1.8 mg/dL (ref 1.7–2.4)

## 2023-11-13 MED ORDER — SODIUM BICARBONATE 650 MG PO TABS
650.0000 mg | ORAL_TABLET | Freq: Two times a day (BID) | ORAL | Status: DC
  Administered 2023-11-13: 650 mg via ORAL
  Filled 2023-11-13: qty 1

## 2023-11-13 MED ORDER — MAGNESIUM SULFATE 2 GM/50ML IV SOLN
2.0000 g | Freq: Once | INTRAVENOUS | Status: AC
  Administered 2023-11-13: 2 g via INTRAVENOUS
  Filled 2023-11-13: qty 50

## 2023-11-13 MED ORDER — POTASSIUM CHLORIDE 10 MEQ/100ML IV SOLN
10.0000 meq | INTRAVENOUS | Status: AC
  Administered 2023-11-13 (×5): 10 meq via INTRAVENOUS
  Filled 2023-11-13 (×5): qty 100

## 2023-11-13 MED ORDER — POTASSIUM CHLORIDE CRYS ER 20 MEQ PO TBCR
40.0000 meq | EXTENDED_RELEASE_TABLET | ORAL | Status: AC
  Administered 2023-11-13 (×2): 40 meq via ORAL
  Filled 2023-11-13 (×2): qty 2

## 2023-11-13 NOTE — Progress Notes (Signed)
 PROGRESS NOTE    Steve Jensen  WGN:[id number] DOB: 03-17-1941 DOA: 11/08/2023 PCP: Glena Landau, MD  Subjective: Pt seen and examined.  Rectal tube is out. Has condom catheter. I helped him order breakfast again with a swiss cheese omelet, blueberry muffin and toast.  WBC down to 14.1.  still profoundly hypokalemia. Giving more repletion today.  Overall pt is feeling better.    Hospital Course: HPI: Steve Jensen is a 83 y.o. male with medical history significant of PAF on Eliquis , hypertension, chronic HFpEF, OSA on CPAP, obesity, type 2 diabetes, CKD stage IIIa, GERD, hepatocellular carcinoma seen by IR, history of prostate cancer, falls who presents after having acute respiratory distress.  History is obtained from review of records as patient is confused currently and states that he is here due to a sign.   Records note patient was being laid flat to be changed at which point he had sudden onset of emesis.  After vomiting patient turned blue and appeared to be choking with difficulty breathing and possibly turned blue.  He was noted to have O2 saturations as low as 86% for which she was placed on 2 L of oxygen  and EMS called to transport to the hospital.   He had just recently been hospitalized 5/31-6/6 for altered mental status after having a fall due to weakness with reported low blood pressures.  Patient was noted not to have significant orthostatic hypotension when checked.  Patient was found to have a acute compression fracture of L4 for which patient was recommended LSO brace and outpatient follow-up.   Over the phone the patient's son makes note that they had just recently seen his father yesterday and he was doing well and seemed alert and oriented.  He does admit that his father has intermittently gotten confused following the falls during prior hospitalizations.   In the emergency department patient was noted to be afebrile with heart rates elevated up to 124 in atrial  fibrillation, blood pressures noted to be as low as 88/53, and O2 saturations currently maintained on 2 L of nasal cannula oxygen .  Labs significant for WBC 32.3, platelets 595, potassium 2.7, BUN 32, creatinine 1.8, calcium  8.6, anion gap 16, and lactic acid 3.7-> 4.4-> 2.6.  Chest x-ray that showed no acute abnormality.  Urinalysis did not show significant signs for infection. Blood cultures were obtained.  Patient was given 4 L lactated Ringer 's, potassium chloride  40 meq p.o., vancomycin , metronidazole, and cefepime .  Significant Events: Admitted 11/08/2023 for lactic acidosis and SIRS   Admission Labs: Lactic acid 3.7 WBC 30.3, HgB 15.1, plt 595 Na 139, K 2.7, CO2 of 20, BUN 32, Scr 1.80, glu 191 UA negative nitrite, negative LE, rare bacteria BNP 187 Procal 0.16  Admission Imaging Studies: CXR No acute findings.   Significant Labs:   Significant Imaging Studies:   Antibiotic Therapy: Anti-infectives (From admission, onward)    Start     Dose/Rate Route Frequency Ordered Stop   11/10/23 0400  vancomycin  (VANCOCIN ) IVPB 1000 mg/200 mL premix        1,000 mg 200 mL/hr over 60 Minutes Intravenous Every 24 hours 11/09/23 1228     11/09/23 1500  ceFEPIme  (MAXIPIME ) 2 g in sodium chloride  0.9 % 100 mL IVPB        2 g 200 mL/hr over 30 Minutes Intravenous Every 12 hours 11/09/23 1127     11/09/23 0145  vancomycin  (VANCOREADY) IVPB 2000 mg/400 mL        2,000  mg 200 mL/hr over 120 Minutes Intravenous  Once 11/09/23 0132 11/09/23 0449   11/09/23 0130  ceFEPIme  (MAXIPIME ) 2 g in sodium chloride  0.9 % 100 mL IVPB        2 g 200 mL/hr over 30 Minutes Intravenous  Once 11/09/23 0125 11/09/23 0323   11/09/23 0130  metroNIDAZOLE (FLAGYL) IVPB 500 mg        500 mg 100 mL/hr over 60 Minutes Intravenous  Once 11/09/23 0125 11/09/23 0245   11/09/23 0130  vancomycin  (VANCOCIN ) IVPB 1000 mg/200 mL premix  Status:  Discontinued        1,000 mg 200 mL/hr over 60 Minutes Intravenous  Once  11/09/23 0125 11/09/23 0132       Procedures:   Consultants:     Assessment and Plan: * Lactic acidosis 11-10-2023 likely cause by C. Diff.  11-12-2023 resolved  C. difficile diarrhea 11-10-2023 pt without any other source for his leukocytosis. Dtr mentions that pt has had diarrhea for several days. C. Diff PCR positive. Although C diff toxin was negative, given that his has a dramatic leukocytosis without any other localizing source, I would consider this an acute C. Difff infection. Will start Dificid tonight.  11-11-2023 continue with Dificid. Only has received 2 doses thus far. If no decrease after 4 doses, may need to change to po vanco. Follow WBC. Will check CT abd/pelvis to evaluate for toxic megacolon. Pt does not have a tender abd.  11-12-2023 Pt with rectal tube now due to liquid diarrhea. Metabolic acidosis improved with IVF and po bicarb. Still profoundly hypokalemic. IV Kcl ordered. Not surprising given on-going diarrhea. Mg level fortunately stable.  Fortunately, WBC down to 16K. CT abd yesterday negative for toxic megacolon.  Order Ensure Zebulun TID to help get protein into him. Repeat Cbc in AM  11-13-2023 WBC down to 14K. No longer has rectal tube. Continue with po dificid bid. If he stays on same trajectory, should be able to be discharged to SNF on Monday, June 23  Acute metabolic acidosis 11-11-2023 start po bicarb 1300 mg bid. Repeat BMP in AM.  11-12-2023 improved with IV and po bicarb. Continue with both while he still has on-going diarrhea. Repeat BMP in aM.  11-13-2023 decrease po bicarb to 650 mg bid. Repeat BMP in AM. Now that he is no longer having massive diarrhea. Serum bicarb should be better tomorrow.  Sepsis (HCC) 11-10-2023 likely due to C. Diff. Present on admission. Had leukocytosis of 30K, lactic acid of 3.7, HR 124.  Acute metabolic encephalopathy-resolved as of 11/11/2023 11-10-2023 due to C. Diff infection. Now resolved. Mentation back to  normal.  Gout 11-10-2023 stable.  11-11-2023 stable  11-12-2023 stable  11-13-2023 stable  Lumbar compression fracture (HCC) 11-10-2023 stable.  11-11-2023 stable  11-12-2023 stable  11-13-2023 stable  CKD stage 3b, GFR 30-44 ml/min (HCC) 11-10-2023 baseline Scr 1.3-1.6  11-11-2023 stable. Scr 1.42 today.  11-12-2023 stable. Scr 1.34 today.  11-13-2023 stable. Scr 1.28, BUN 15 today.  Chronic heart failure with preserved ejection fraction (HFpEF) (HCC) 11-10-2023 stable. Holding lasix  for now due to volume depletion on admission.  11-11-2023 stable. Continue to hold lasix .  11-12-2023 continue to hold lasix . On IVF for continued diarrhea.  11-13-2023 continue to hold lasix . Can stop IVF.  Hypokalemia 11-10-2023 replete with IV Kcl and IV Mg. Repeat BMP and Mg in AM. His c. Diff diarrhea could certainly cause massive loss of K and Mg  11-11-2023 resolved after repletion.  11-12-2023 continue with  IV Kcl replacement. Ongoing enteral losses due to diarrhea from C. Diff.  11-13-2023 continue with aggressive IV and po kcl replacement. Now that diarrhea has slowed down and decreasing po bicarb dose, hopefully K levels will be normal by tomorrow.  PAF (paroxysmal atrial fibrillation) (HCC) 11-10-2023 stable. Continue Eliquis . Restart Toprol   11-11-2023 stable. Continue Eliquis  and Toprol . If CT abd shows toxic megacolon, will need to stop Eliquis .  11-12-2023 stable. Continue eliquis  and Toprol -XL.  11-13-2023 stable  Morbid obesity (HCC) 11-10-2023 Body mass index is 40.27 kg/m.   OSA (obstructive sleep apnea) 11-10-2023 stable.  11-11-2023 stable  11-12-2023 stable  11-13-2023 stable  Type 2 diabetes mellitus (HCC) 11-10-2023 on SSI. Holding metformin .  11-12-2023 CBG acceptable. Continue with SSI.  11-13-2023 acceptable CBG ranges. On SSI. Still holding metformin  and Soliqua  Hypomagnesemia-resolved as of 11/12/2023 11-10-2023 replete with IV  Mg. Repeat Mg in AM.  His c. Diff diarrhea could certainly cause massive loss of K and Mg  11-11-2023 resolved with repletion. Mg today of 2.2  Thrombocytosis-resolved as of 11/11/2023 11-10-2023 likely due to c. Diff infection. Repeat CBC in AM. 11-11-2023 resolved. Plt down to 394K today.  Nausea and vomiting-resolved as of 11/12/2023 11-10-2023 stable.  11-11-2023 resolved.  Transient hypotension-resolved as of 11/12/2023 11-10-2023 stable. Off midodrine.  11-12-2023 resolved.  DVT prophylaxis: apixaban  (ELIQUIS ) tablet 2.5 mg Start: 11/09/23 1200 apixaban  (ELIQUIS ) tablet 2.5 mg     Code Status: Limited: Do not attempt resuscitation (DNR) -DNR-LIMITED -Do Not Intubate/DNI  Family Communication: discussed with pt's dtr Beverly by phone Disposition Plan: SNF Reason for continuing need for hospitalization: remains on IV Kcl replacement.   Objective: Vitals:   11/12/23 2344 11/13/23 0401 11/13/23 0746 11/13/23 1112  BP: 108/69 110/69 112/60 108/63  Pulse: 91 80 88 95  Resp: 18 18 18 18   Temp: 98 F (36.7 C) 98.5 F (36.9 C) 98.3 F (36.8 C) 97.7 F (36.5 C)  TempSrc: Oral Oral Oral Oral  SpO2: 97% 98% 98%   Weight:  88.7 kg    Height:        Intake/Output Summary (Last 24 hours) at 11/13/2023 1130 Last data filed at 11/13/2023 0746 Gross per 24 hour  Intake 1009 ml  Output 1250 ml  Net -241 ml   Filed Weights   11/12/23 0500 11/12/23 0618 11/13/23 0401  Weight: 90 kg 90 kg 88.7 kg   Examination:  Physical Exam Vitals and nursing note reviewed.  Constitutional:      General: He is not in acute distress.    Appearance: He is obese. He is not toxic-appearing.  HENT:     Head: Normocephalic and atraumatic.   Cardiovascular:     Rate and Rhythm: Normal rate. Rhythm irregular.  Pulmonary:     Effort: Pulmonary effort is normal.     Breath sounds: Normal breath sounds.  Abdominal:     General: Abdomen is protuberant. There is no distension.     Palpations:  Abdomen is soft.   Skin:    Capillary Refill: Capillary refill takes less than 2 seconds.   Neurological:     General: No focal deficit present.     Mental Status: He is alert and oriented to person, place, and time.    Data Reviewed: I have personally reviewed following labs and imaging studies  CBC: Recent Labs  Lab 11/09/23 0058 11/10/23 0535 11/11/23 0413 11/12/23 0404 11/13/23 0429  WBC 30.3* 20.0* 25.8* 16.2* 14.1*  NEUTROABS 25.6*  --  20.8* 11.9*  9.7*  HGB 15.1 11.8* 12.6* 12.0* 12.1*  HCT 45.5 36.5* 38.9* 36.9* 36.9*  MCV 82.1 83.7 84.9 81.6 82.9  PLT 595* 437* 394 439* 429*   Basic Metabolic Panel: Recent Labs  Lab 11/09/23 0058 11/10/23 0535 11/10/23 1400 11/11/23 0413 11/12/23 0404 11/13/23 0429  NA 139 138  --  138 139 138  K 2.7* 3.1*  --  4.1 2.7* 2.9*  CL 103 109  --  114* 110 111  CO2 20* 20*  --  11* 20* 20*  GLUCOSE 191* 155*  --  125* 134* 112*  BUN 32* 25*  --  21 19 15   CREATININE 1.80* 1.55*  --  1.42* 1.34* 1.28*  CALCIUM  8.6* 8.1*  --  7.8* 8.2* 8.1*  MG  --   --  1.6* 2.2 2.0 1.8   GFR: Estimated Creatinine Clearance: 49.9 mL/min (A) (by C-G formula based on SCr of 1.28 mg/dL (H)). Liver Function Tests: Recent Labs  Lab 11/09/23 0058 11/10/23 0535  AST 20 16  ALT 18 13  ALKPHOS 149* 94  BILITOT 0.6 0.9  PROT 5.9* 4.6*  ALBUMIN  2.8* 2.1*   Coagulation Profile: Recent Labs  Lab 11/09/23 0058  INR 1.2   BNP (last 3 results) Recent Labs    09/03/23 2103 10/24/23 1123 11/09/23 1048  BNP 152.3* 411.2* 187.8*   CBG: Recent Labs  Lab 11/12/23 0820 11/12/23 1207 11/12/23 1639 11/12/23 2043 11/13/23 0834  GLUCAP 124* 105* 121* 120* 109*   Sepsis Labs: Recent Labs  Lab 11/09/23 0825 11/09/23 0910 11/09/23 1048 11/09/23 1312 11/12/23 0404  PROCALCITON  --   --  0.16  --   --   LATICACIDVEN 2.6* 2.4*  --  1.8 1.0    Recent Results (from the past 240 hours)  Blood Culture (routine x 2)     Status: None  (Preliminary result)   Collection Time: 11/09/23 12:10 AM   Specimen: BLOOD  Result Value Ref Range Status   Specimen Description BLOOD BLOOD LEFT FOREARM  Final   Special Requests   Final    BOTTLES DRAWN AEROBIC AND ANAEROBIC Blood Culture results may not be optimal due to an inadequate volume of blood received in culture bottles   Culture   Final    NO GROWTH 4 DAYS Performed at [facility], [address] [place], [place] [address]    Report Status PENDING  Incomplete  Blood Culture (routine x 2)     Status: None (Preliminary result)   Collection Time: 11/09/23 12:26 AM   Specimen: BLOOD  Result Value Ref Range Status   Specimen Description BLOOD BLOOD RIGHT ARM AEROBIC BOTTLE ONLY  Final   Special Requests   Final    BOTTLES DRAWN AEROBIC ONLY Blood Culture adequate volume   Culture   Final    NO GROWTH 4 DAYS Performed at [facility], [address] [place], [place] [address]    Report Status PENDING  Incomplete  C Difficile Quick Screen w PCR reflex     Status: Abnormal   Collection Time: 11/10/23  3:45 PM   Specimen: STOOL  Result Value Ref Range Status   C Diff antigen POSITIVE (A) NEGATIVE Final   C Diff toxin NEGATIVE NEGATIVE Final   C Diff interpretation Results are indeterminate. See PCR results.  Final    Comment: Performed at [facility], [address] [place], [place] [address]  C. Diff by PCR, Reflexed  Status: Abnormal   Collection Time: 11/10/23  3:45 PM  Result Value Ref Range Status   Toxigenic C. Difficile by PCR POSITIVE (A) NEGATIVE Final    Comment: Positive for toxigenic C. difficile with little to no toxin production. Only treat if clinical presentation suggests symptomatic illness. Performed at [facility], [address] [place], [place] [address]      Radiology Studies: CT ABDOMEN PELVIS WO CONTRAST Result Date: 11/11/2023 EXAM: CT ABDOMEN AND PELVIS WITHOUT CONTRAST 11/11/2023 05:18:37 PM TECHNIQUE: CT of  the abdomen and pelvis was performed without the administration of intravenous contrast. Multiplanar reformatted images are provided for review. Automated exposure control, iterative reconstruction, and/or weight based adjustment of the mA/kV was utilized to reduce the radiation dose to as low as reasonably achievable. COMPARISON: MR abdomen dated 07/13/2023. CLINICAL HISTORY: Diarrhea; c. diff. evaluate for toxic megacolon. FINDINGS: LOWER CHEST: Mild patchy bilateral lower lobe opacities, favoring atelectasis. Trace bilateral pleural effusions. LIVER: Cirrhosis. Hepatic lesions on prior MR are poorly visualized due to motion degradation and lack of contrast (image 32). GALLBLADDER AND BILE DUCTS: Gallbladder is unremarkable. No biliary ductal dilatation. SPLEEN: No acute abnormality. PANCREAS: No acute abnormality. ADRENAL GLANDS: No acute abnormality. KIDNEYS, URETERS AND BLADDER: Stable 2.5 cm renal adenoma (image 24), benign, no follow-up is recommended. No stones in the kidneys or ureters. No hydronephrosis. No perinephric or periureteral stranding. Urinary bladder is unremarkable. GI AND BOWEL: Small hiatal hernia. The appendix is not discretely visualized. No colonic wall thickening or inflammatory changes to suggest toxic megacolon. Mild sigmoid diverticulosis, without evidence of diverticulitis. PERITONEUM AND RETROPERITONEUM: No ascites. No free air. VASCULATURE: Atherosclerotic calcifications of the abdominal aorta and branch vessels. LYMPH NODES: No lymphadenopathy. REPRODUCTIVE ORGANS: Status post prostatectomy. BONES AND SOFT TISSUES: Mild superior endplate changes at L1 and L4, chronic. No acute osseous abnormality. No focal soft tissue abnormality. IMPRESSION: 1. No CT evidence of toxic megacolon. 2. Cirrhosis. Hepatic lesions on prior MR poorly visualized due to motion degradation and lack of contrast. 3. Additional ancillary findings as above. Electronically signed by: Zadie Herter MD  11/11/2023 08:01 PM EDT RP Workstation: [id number]    Scheduled Meds:  allopurinol   100 mg Oral q AM   apixaban   2.5 mg Oral BID   ezetimibe   10 mg Oral q AM   fidaxomicin  200 mg Oral BID   FLUoxetine   10 mg Oral QPM   insulin  aspart  0-9 Units Subcutaneous TID WC   metoprolol  succinate  50 mg Oral BID   pantoprazole   40 mg Oral Daily   potassium chloride   40 mEq Oral Q4H   pregabalin   50 mg Oral BID   sodium bicarbonate  650 mg Oral BID   sodium chloride  flush  3 mL Intravenous Q12H   traZODone   25 mg Oral QHS   Continuous Infusions:  magnesium  sulfate bolus IVPB     potassium chloride  10 mEq (11/13/23 1043)     LOS: 4 days   Time spent: 55 minutes  Unk Garb, DO  [facility]  11/13/2023, 11:30 AM

## 2023-11-13 NOTE — Progress Notes (Signed)
 Physical Therapy Treatment Patient Details Name: Steve Jensen MRN: [id number] DOB: [birth year] Today's Date: 11/13/2023   History of Present Illness Patient is 83 y.o. male presenting to ED from [facility] after vomiting with concern for sepsis. Pt recently admitted 5/31-6/6 with A-fib and AMS after a recent fall resulting in L4 comp fx w/ retropulsion 3mm, LSO brace for management. PMH significant for Afib, HTN, chronic HF-EF, OSA on CPAP, DM II, CKD III, GERD, hepatocellular carcinoma, prostate cancer.   PT Comments  Pt greeted supine in bed, pleasant and agreeable to PT session. He required pericare upon arrival. Pt rolled to the right with maxA and use of bedrail. Deferred mobility attempts d/t continuing diarrhea. RN and NT notified. Will continue to follow acutely and advance appropriately.     If plan is discharge home, recommend the following: Help with stairs or ramp for entrance;Assist for transportation;Assistance with cooking/housework;Two people to help with walking and/or transfers;A lot of help with bathing/dressing/bathroom   Can travel by private vehicle     No  Equipment Recommendations  None recommended by PT    Recommendations for Other Services       Precautions / Restrictions Precautions Precautions: Back;Fall Recall of Precautions/Restrictions: Impaired Precaution/Restrictions Comments: Enteric Precautions Required Braces or Orthoses: Spinal Brace Spinal Brace: Lumbar corset Restrictions Weight Bearing Restrictions Per Provider Order: No     Mobility  Bed Mobility Overal bed mobility: Needs Assistance Bed Mobility: Rolling Rolling: [place]on assist, Used rails         General bed mobility comments: Pt greeted soiled in bed from diarrhea. Initiated pericare. Pt rolled to the R. Cued pt to reach across with LUE to bedrail and flex L knee to push himself over to the side. MaxA to facilitate turn. While in sidelying pt began to have diarrhea again,  returned him to supine for him to finished. Called NT to room to finish hygiene. Deferred further mobilty attempts d/t pt not feeling well.    Transfers                        Ambulation/Gait                   Stairs             Wheelchair Mobility     Tilt Bed    Modified Rankin (Stroke Patients Only)       Balance Overall balance assessment: Needs assistance (Bed level session)                                          Communication Communication Communication: No apparent difficulties  Cognition Arousal: Alert Behavior During Therapy: WFL for tasks assessed/performed   PT - Cognitive impairments: No apparent impairments                       PT - Cognition Comments: Pt A,Ox4. Following commands: Impaired Following commands impaired: Follows one step commands with increased time    Cueing Cueing Techniques: Verbal cues, Tactile cues  Exercises      General Comments General comments (skin integrity, edema, etc.): RN and NT notified of pt's frequent diarrhea      Pertinent Vitals/Pain Pain Assessment Pain Assessment: No/denies pain    Home Living  Prior Function            PT Goals (current goals can now be found in the care plan section) Acute Rehab PT Goals Patient Stated Goal: Get out of bed Progress towards PT goals: Not progressing toward goals - comment (Pt limited by diarrhea)    Frequency    Min 1X/week      PT Plan      Co-evaluation              AM-PAC PT 6 Clicks Mobility   Outcome Measure  Help needed turning from your back to your side while in a flat bed without using bedrails?: A Lot Help needed moving from lying on your back to sitting on the side of a flat bed without using bedrails?: Total Help needed moving to and from a bed to a chair (including a wheelchair)?: Total Help needed standing up from a chair using your arms (e.g.,  wheelchair or bedside chair)?: Total Help needed to walk in hospital room?: Total Help needed climbing 3-5 steps with a railing? : Total 6 Click Score: 7    End of Session   Activity Tolerance: Treatment limited secondary to medical complications (Comment) (diarrhea) Patient left: in bed;with call bell/phone within reach;with bed alarm set Nurse Communication: Mobility status PT Visit Diagnosis: Unsteadiness on feet (R26.81);Other abnormalities of gait and mobility (R26.89);Repeated falls (R29.6);Muscle weakness (generalized) (M62.81);Difficulty in walking, not elsewhere classified (R26.2);Other symptoms and signs involving the nervous system (R29.898)     Time: 1131-1141 PT Time Calculation (min) (ACUTE ONLY): 10 min  Charges:    $Therapeutic Activity: 8-22 mins PT General Charges $$ ACUTE PT VISIT: 1 Visit                     Glenford Lanes, PT, DPT Acute Rehabilitation Services Office: [phone] Secure Chat Preferred  Riva Chester 11/13/2023, 1:02 PM

## 2023-11-13 NOTE — Plan of Care (Signed)

## 2023-11-14 DIAGNOSIS — E876 Hypokalemia: Secondary | ICD-10-CM | POA: Diagnosis not present

## 2023-11-14 DIAGNOSIS — I5032 Chronic diastolic (congestive) heart failure: Secondary | ICD-10-CM | POA: Diagnosis not present

## 2023-11-14 DIAGNOSIS — N1832 Chronic kidney disease, stage 3b: Secondary | ICD-10-CM | POA: Diagnosis not present

## 2023-11-14 DIAGNOSIS — A0472 Enterocolitis due to Clostridium difficile, not specified as recurrent: Secondary | ICD-10-CM | POA: Diagnosis not present

## 2023-11-14 LAB — CULTURE, BLOOD (ROUTINE X 2)
Culture: NO GROWTH
Culture: NO GROWTH
Special Requests: ADEQUATE

## 2023-11-14 LAB — CBC WITH DIFFERENTIAL/PLATELET
Abs Immature Granulocytes: 0.28 10*3/uL — ABNORMAL HIGH (ref 0.00–0.07)
Basophils Absolute: 0.1 10*3/uL (ref 0.0–0.1)
Basophils Relative: 1 %
Eosinophils Absolute: 0.4 10*3/uL (ref 0.0–0.5)
Eosinophils Relative: 3 %
HCT: 38.5 % — ABNORMAL LOW (ref 39.0–52.0)
Hemoglobin: 12.2 g/dL — ABNORMAL LOW (ref 13.0–17.0)
Immature Granulocytes: 2 %
Lymphocytes Relative: 14 %
Lymphs Abs: 1.9 10*3/uL (ref 0.7–4.0)
MCH: 26.4 pg (ref 26.0–34.0)
MCHC: 31.7 g/dL (ref 30.0–36.0)
MCV: 83.3 fL (ref 80.0–100.0)
Monocytes Absolute: 1.6 10*3/uL — ABNORMAL HIGH (ref 0.1–1.0)
Monocytes Relative: 12 %
Neutro Abs: 9.6 10*3/uL — ABNORMAL HIGH (ref 1.7–7.7)
Neutrophils Relative %: 68 %
Platelets: 441 10*3/uL — ABNORMAL HIGH (ref 150–400)
RBC: 4.62 MIL/uL (ref 4.22–5.81)
RDW: 18.1 % — ABNORMAL HIGH (ref 11.5–15.5)
WBC: 13.9 10*3/uL — ABNORMAL HIGH (ref 4.0–10.5)
nRBC: 0 % (ref 0.0–0.2)

## 2023-11-14 LAB — BASIC METABOLIC PANEL WITH GFR
Anion gap: 9 (ref 5–15)
BUN: 13 mg/dL (ref 8–23)
CO2: 21 mmol/L — ABNORMAL LOW (ref 22–32)
Calcium: 8.3 mg/dL — ABNORMAL LOW (ref 8.9–10.3)
Chloride: 109 mmol/L (ref 98–111)
Creatinine, Ser: 1.22 mg/dL (ref 0.61–1.24)
GFR, Estimated: 60 mL/min — ABNORMAL LOW (ref >60)
Glucose, Bld: 127 mg/dL — ABNORMAL HIGH (ref 70–99)
Potassium: 3.3 mmol/L — ABNORMAL LOW (ref 3.5–5.1)
Sodium: 139 mmol/L (ref 135–145)

## 2023-11-14 LAB — GLUCOSE, CAPILLARY
Glucose-Capillary: 127 mg/dL — ABNORMAL HIGH (ref 70–99)
Glucose-Capillary: 169 mg/dL — ABNORMAL HIGH (ref 70–99)
Glucose-Capillary: 156 mg/dL — ABNORMAL HIGH (ref 70–99)
Glucose-Capillary: 175 mg/dL — ABNORMAL HIGH (ref 70–99)

## 2023-11-14 LAB — MAGNESIUM: Magnesium: 2 mg/dL (ref 1.7–2.4)

## 2023-11-14 MED ORDER — DIPHENHYDRAMINE-ZINC ACETATE 2-0.1 % EX CREA: TOPICAL_CREAM | Freq: Three times a day (TID) | CUTANEOUS | Status: DC | PRN

## 2023-11-14 MED ORDER — POTASSIUM CHLORIDE CRYS ER 20 MEQ PO TBCR
40.0000 meq | EXTENDED_RELEASE_TABLET | ORAL | Status: AC
  Administered 2023-11-14 (×2): 40 meq via ORAL
  Filled 2023-11-14 (×2): qty 2

## 2023-11-14 NOTE — Progress Notes (Signed)
 Patient refused CPAP and said he will bring his tomorrow to wear at night.

## 2023-11-14 NOTE — Progress Notes (Signed)
 PROGRESS NOTE    Steve Jensen  FMW:[id number] DOB: 1940-11-23 DOA: 11/08/2023 PCP: Loreli Kins, MD  Subjective: Pt seen and examined.  Sleeping peacefully. On RA  Since yesterday(6-20-202) at 7 AM, pt has had 6 liquid diarrheal stools. No rectal tube. WBC stable at 13.9 K 3.3 today after aggressive Kcl supplementation and IV mg replacement.\ Pt ate 80% of his dinner last night. Pt is tolerating dificid  without difficulty. Abd is soft. No concern for toxic megacolon.   Hospital Course: HPI: Steve Jensen is a 83 y.o. male with medical history significant of PAF on Eliquis , hypertension, chronic HFpEF, OSA on CPAP, obesity, type 2 diabetes, CKD stage IIIa, GERD, hepatocellular carcinoma seen by IR, history of prostate cancer, falls who presents after having acute respiratory distress.  History is obtained from review of records as patient is confused currently and states that he is here due to a sign.   Records note patient was being laid flat to be changed at which point he had sudden onset of emesis.  After vomiting patient turned blue and appeared to be choking with difficulty breathing and possibly turned blue.  He was noted to have O2 saturations as low as 86% for which she was placed on 2 L of oxygen  and EMS called to transport to the hospital.   He had just recently been hospitalized 5/31-6/6 for altered mental status after having a fall due to weakness with reported low blood pressures.  Patient was noted not to have significant orthostatic hypotension when checked.  Patient was found to have a acute compression fracture of L4 for which patient was recommended LSO brace and outpatient follow-up.   Over the phone the patient's son makes note that they had just recently seen his father yesterday and he was doing well and seemed alert and oriented.  He does admit that his father has intermittently gotten confused following the falls during prior hospitalizations.   In the  emergency department patient was noted to be afebrile with heart rates elevated up to 124 in atrial fibrillation, blood pressures noted to be as low as 88/53, and O2 saturations currently maintained on 2 L of nasal cannula oxygen .  Labs significant for WBC 32.3, platelets 595, potassium 2.7, BUN 32, creatinine 1.8, calcium  8.6, anion gap 16, and lactic acid 3.7-> 4.4-> 2.6.  Chest x-ray that showed no acute abnormality.  Urinalysis did not show significant signs for infection. Blood cultures were obtained.  Patient was given 4 L lactated Ringer 's, potassium chloride  40 meq p.o., vancomycin , metronidazole , and cefepime .  Significant Events: Admitted 11/08/2023 for lactic acidosis and SIRS   Admission Labs: Lactic acid 3.7 WBC 30.3, HgB 15.1, plt 595 Na 139, K 2.7, CO2 of 20, BUN 32, Scr 1.80, glu 191 UA negative nitrite, negative LE, rare bacteria BNP 187 Procal 0.16  Admission Imaging Studies: CXR No acute findings.   Significant Labs:   Significant Imaging Studies:   Antibiotic Therapy: Anti-infectives (From admission, onward)    Start     Dose/Rate Route Frequency Ordered Stop   11/10/23 0400  vancomycin  (VANCOCIN ) IVPB 1000 mg/200 mL premix        1,000 mg 200 mL/hr over 60 Minutes Intravenous Every 24 hours 11/09/23 1228     11/09/23 1500  ceFEPIme  (MAXIPIME ) 2 g in sodium chloride  0.9 % 100 mL IVPB        2 g 200 mL/hr over 30 Minutes Intravenous Every 12 hours 11/09/23 1127     11/09/23 0145  vancomycin  (VANCOREADY) IVPB 2000 mg/400 mL        2,000 mg 200 mL/hr over 120 Minutes Intravenous  Once 11/09/23 0132 11/09/23 0449   11/09/23 0130  ceFEPIme  (MAXIPIME ) 2 g in sodium chloride  0.9 % 100 mL IVPB        2 g 200 mL/hr over 30 Minutes Intravenous  Once 11/09/23 0125 11/09/23 0323   11/09/23 0130  metroNIDAZOLE  (FLAGYL ) IVPB 500 mg        500 mg 100 mL/hr over 60 Minutes Intravenous  Once 11/09/23 0125 11/09/23 0245   11/09/23 0130  vancomycin  (VANCOCIN ) IVPB 1000  mg/200 mL premix  Status:  Discontinued        1,000 mg 200 mL/hr over 60 Minutes Intravenous  Once 11/09/23 0125 11/09/23 0132       Procedures:   Consultants:     Assessment and Plan: * Lactic acidosis-resolved as of 11/14/2023 11-10-2023 likely cause by C. Diff.  11-12-2023 resolved  C. difficile diarrhea 11-10-2023 pt without any other source for his leukocytosis. Dtr mentions that pt has had diarrhea for several days. C. Diff PCR positive. Although C diff toxin was negative, given that his has a dramatic leukocytosis without any other localizing source, I would consider this an acute C. Difff infection. Will start Dificid  tonight.  11-11-2023 continue with Dificid . Only has received 2 doses thus far. If no decrease after 4 doses, may need to change to po vanco. Follow WBC. Will check CT abd/pelvis to evaluate for toxic megacolon. Pt does not have a tender abd.  11-12-2023 Pt with rectal tube now due to liquid diarrhea. Metabolic acidosis improved with IVF and po bicarb. Still profoundly hypokalemic. IV Kcl ordered. Not surprising given on-going diarrhea. Mg level fortunately stable.  Fortunately, WBC down to 16K. CT abd yesterday negative for toxic megacolon.  Order Ensure Tarez TID to help get protein into him. Repeat Cbc in AM  11-13-2023 WBC down to 14K. No longer has rectal tube. Continue with po dificid  bid. If he stays on same trajectory, should be able to be discharged to SNF on Monday, June 23  11-14-2023 WBC stable at 13.9.  6 stools yesterday. WBC has responded to Dificid . Do not give any anti-diarrheals. Do not give Questran monitor stool output.  Acute metabolic acidosis 11-11-2023 start po bicarb 1300 mg bid. Repeat BMP in AM.  11-12-2023 improved with IV and po bicarb. Continue with both while he still has on-going diarrhea. Repeat BMP in aM.  11-13-2023 decrease po bicarb to 650 mg bid. Repeat BMP in AM. Now that he is no longer having massive diarrhea. Serum  bicarb should be better tomorrow.  11-14-2023 stop bicarb. This should help with hypokalemia. Repeat BMP in Am.  Sepsis (HCC) 11-10-2023 likely due to C. Diff. Present on admission. Had leukocytosis of 30K, lactic acid of 3.7, HR 124.  Hypokalemia 11-10-2023 replete with IV Kcl and IV Mg. Repeat BMP and Mg in AM. His c. Diff diarrhea could certainly cause massive loss of K and Mg  11-11-2023 resolved after repletion.  11-12-2023 continue with IV Kcl replacement. Ongoing enteral losses due to diarrhea from C. Diff.  11-13-2023 continue with aggressive IV and po kcl replacement. Now that diarrhea has slowed down and decreasing po bicarb dose, hopefully K levels will be normal by tomorrow.  11-14-2023 due to on going GI losses from his c.diff diarrheal. After aggressive replacement for the last 4 days, serum K is up to 3.3. will give more oral Kcl.  Hopefully stopping po bicarb will also help.  Acute metabolic encephalopathy-resolved as of 11/11/2023 11-10-2023 due to C. Diff infection. Now resolved. Mentation back to normal.  Gout 11-10-2023 stable.  11-11-2023 stable  11-12-2023 stable  11-13-2023 stable  11-14-2023 stable  Lumbar compression fracture (HCC) 11-10-2023 stable.  11-11-2023 stable  11-12-2023 stable  11-13-2023 stable  11-14-2023 stable  CKD stage 3b, GFR 30-44 ml/min (HCC) 11-10-2023 baseline Scr 1.3-1.6  11-11-2023 stable. Scr 1.42 today.  11-12-2023 stable. Scr 1.34 today.  11-13-2023 stable. Scr 1.28, BUN 15 today.  11-14-2023 Scr 1.22, BUN 13 today.  Chronic heart failure with preserved ejection fraction (HFpEF) (HCC) 11-10-2023 stable. Holding lasix  for now due to volume depletion on admission.  11-11-2023 stable. Continue to hold lasix .  11-12-2023 continue to hold lasix . On IVF for continued diarrhea.  11-13-2023 continue to hold lasix . Can stop IVF.  11-14-2203 continue to hold lasix . Continue with oral hydration. On Toprol -XL.  PAF  (paroxysmal atrial fibrillation) (HCC) 11-10-2023 stable. Continue Eliquis . Restart Toprol   11-11-2023 stable. Continue Eliquis  and Toprol . If CT abd shows toxic megacolon, will need to stop Eliquis .  11-12-2023 stable. Continue eliquis  and Toprol -XL.  11-13-2023 stable  11-14-2023 stable. On Eliquis  2.5 mg bid and toprol -xl 50 mg bid.  Morbid obesity (HCC) 11-10-2023 Body mass index is 40.27 kg/m.   OSA (obstructive sleep apnea) 11-10-2023 stable.  11-11-2023 stable  11-12-2023 stable  11-13-2023 stable  11-14-2023 stable  Type 2 diabetes mellitus (HCC) 11-10-2023 on SSI. Holding metformin .  11-12-2023 CBG acceptable. Continue with SSI.  11-13-2023 acceptable CBG ranges. On SSI. Still holding metformin  and Soliqua  11-14-2023 CBG accepting. On SSI.  Hypomagnesemia-resolved as of 11/12/2023 11-10-2023 replete with IV Mg. Repeat Mg in AM.  His c. Diff diarrhea could certainly cause massive loss of K and Mg  11-11-2023 resolved with repletion. Mg today of 2.2  Thrombocytosis-resolved as of 11/11/2023 11-10-2023 likely due to c. Diff infection. Repeat CBC in AM. 11-11-2023 resolved. Plt down to 394K today.  Nausea and vomiting-resolved as of 11/12/2023 11-10-2023 stable.  11-11-2023 resolved.  Transient hypotension-resolved as of 11/12/2023 11-10-2023 stable. Off midodrine .  11-12-2023 resolved.      DVT prophylaxis: apixaban  (ELIQUIS ) tablet 2.5 mg Start: 11/09/23 1200 apixaban  (ELIQUIS ) tablet 2.5 mg     Code Status: Limited: Do not attempt resuscitation (DNR) -DNR-LIMITED -Do Not Intubate/DNI  Family Communication: no family at bedside. Spoke to dtr Hammond on 11-13-2023 Disposition Plan: SNF Reason for continuing need for hospitalization: continues on po dificid . Monitoring stool output. Repleting hypokalemia.  Objective: Vitals:   11/14/23 0034 11/14/23 0623 11/14/23 0842 11/14/23 1217  BP: 103/66 123/71 128/76 105/72  Pulse: 99 90 83 81  Resp: 18  18 17 17   Temp: 98.2 F (36.8 C) (!) 97.4 F (36.3 C) 97.7 F (36.5 C) 98 F (36.7 C)  TempSrc: Oral Oral Oral Oral  SpO2: 96% 95% 95% 96%  Weight:  124.3 kg    Height:        Intake/Output Summary (Last 24 hours) at 11/14/2023 1312 Last data filed at 11/14/2023 9376 Gross per 24 hour  Intake 480 ml  Output 1050 ml  Net -570 ml   Filed Weights   11/12/23 0618 11/13/23 0401 11/14/23 0623  Weight: 90 kg 88.7 kg 124.3 kg    Examination:  Physical Exam Vitals and nursing note reviewed.  Constitutional:      General: He is not in acute distress.    Appearance: He is obese. He is not toxic-appearing  or diaphoretic.     Comments: Sleeping peacefully. No distress  HENT:     Head: Normocephalic and atraumatic.   Cardiovascular:     Rate and Rhythm: Normal rate and regular rhythm.  Pulmonary:     Effort: Pulmonary effort is normal. No respiratory distress.     Breath sounds: Normal breath sounds.  Abdominal:     General: Abdomen is protuberant. There is no distension.     Tenderness: There is no abdominal tenderness. There is no guarding.   Musculoskeletal:     Right lower leg: No edema.     Left lower leg: No edema.   Skin:    General: Skin is warm and dry.     Capillary Refill: Capillary refill takes less than 2 seconds.     Data Reviewed: I have personally reviewed following labs and imaging studies  CBC: Recent Labs  Lab 11/09/23 0058 11/10/23 0535 11/11/23 0413 11/12/23 0404 11/13/23 0429 11/14/23 0527  WBC 30.3* 20.0* 25.8* 16.2* 14.1* 13.9*  NEUTROABS 25.6*  --  20.8* 11.9* 9.7* 9.6*  HGB 15.1 11.8* 12.6* 12.0* 12.1* 12.2*  HCT 45.5 36.5* 38.9* 36.9* 36.9* 38.5*  MCV 82.1 83.7 84.9 81.6 82.9 83.3  PLT 595* 437* 394 439* 429* 441*   Basic Metabolic Panel: Recent Labs  Lab 11/10/23 0535 11/10/23 1400 11/11/23 0413 11/12/23 0404 11/13/23 0429 11/14/23 0527  NA 138  --  138 139 138 139  K 3.1*  --  4.1 2.7* 2.9* 3.3*  CL 109  --  114* 110 111  109  CO2 20*  --  11* 20* 20* 21*  GLUCOSE 155*  --  125* 134* 112* 127*  BUN 25*  --  21 19 15 13   CREATININE 1.55*  --  1.42* 1.34* 1.28* 1.22  CALCIUM  8.1*  --  7.8* 8.2* 8.1* 8.3*  MG  --  1.6* 2.2 2.0 1.8 2.0   GFR: Estimated Creatinine Clearance: 61.9 mL/min (by C-G formula based on SCr of 1.22 mg/dL). Liver Function Tests: Recent Labs  Lab 11/09/23 0058 11/10/23 0535  AST 20 16  ALT 18 13  ALKPHOS 149* 94  BILITOT 0.6 0.9  PROT 5.9* 4.6*  ALBUMIN  2.8* 2.1*   Coagulation Profile: Recent Labs  Lab 11/09/23 0058  INR 1.2   BNP (last 3 results) Recent Labs    09/03/23 2103 10/24/23 1123 11/09/23 1048  BNP 152.3* 411.2* 187.8*   CBG: Recent Labs  Lab 11/13/23 1229 11/13/23 1618 11/13/23 2144 11/14/23 0840 11/14/23 1214  GLUCAP 173* 181* 135* 127* 169*   Sepsis Labs: Recent Labs  Lab 11/09/23 0825 11/09/23 0910 11/09/23 1048 11/09/23 1312 11/12/23 0404  PROCALCITON  --   --  0.16  --   --   LATICACIDVEN 2.6* 2.4*  --  1.8 1.0    Recent Results (from the past 240 hours)  Blood Culture (routine x 2)     Status: None   Collection Time: 11/09/23 12:10 AM   Specimen: BLOOD  Result Value Ref Range Status   Specimen Description BLOOD BLOOD LEFT FOREARM  Final   Special Requests   Final    BOTTLES DRAWN AEROBIC AND ANAEROBIC Blood Culture results may not be optimal due to an inadequate volume of blood received in culture bottles   Culture   Final    NO GROWTH 5 DAYS Performed at [facility], [address] [place], [place] [address]    Report Status 11/14/2023 FINAL  Final  Blood  Culture (routine x 2)     Status: None   Collection Time: 11/09/23 12:26 AM   Specimen: BLOOD  Result Value Ref Range Status   Specimen Description BLOOD BLOOD RIGHT ARM AEROBIC BOTTLE ONLY  Final   Special Requests   Final    BOTTLES DRAWN AEROBIC ONLY Blood Culture adequate volume   Culture   Final    NO GROWTH 5 DAYS Performed at [facility],  [address] [place], [place] [address]    Report Status 11/14/2023 FINAL  Final  C Difficile Quick Screen w PCR reflex     Status: Abnormal   Collection Time: 11/10/23  3:45 PM   Specimen: STOOL  Result Value Ref Range Status   C Diff antigen POSITIVE (A) NEGATIVE Final   C Diff toxin NEGATIVE NEGATIVE Final   C Diff interpretation Results are indeterminate. See PCR results.  Final    Comment: Performed at [facility], [address], [place], [place] [address]  C. Diff by PCR, Reflexed     Status: Abnormal   Collection Time: 11/10/23  3:45 PM  Result Value Ref Range Status   Toxigenic C. Difficile by PCR POSITIVE (A) NEGATIVE Final    Comment: Positive for toxigenic C. difficile with little to no toxin production. Only treat if clinical presentation suggests symptomatic illness. Performed at [facility], [address] [place], [place] [address]     Scheduled Meds:  allopurinol   100 mg Oral q AM   apixaban   2.5 mg Oral BID   ezetimibe   10 mg Oral q AM   fidaxomicin   200 mg Oral BID   FLUoxetine   10 mg Oral QPM   insulin  aspart  0-9 Units Subcutaneous TID WC   metoprolol  succinate  50 mg Oral BID   pantoprazole   40 mg Oral Daily   potassium chloride   40 mEq Oral Q4H   pregabalin   50 mg Oral BID   sodium chloride  flush  3 mL Intravenous Q12H   traZODone   25 mg Oral QHS   Continuous Infusions:   LOS: 5 days   Time spent: 55 minutes  Camellia Door, DO  [facility]  11/14/2023, 1:12 PM

## 2023-11-14 NOTE — Progress Notes (Signed)
 Orthopedic Tech Progress Note Patient Details:  [name] [birth year] [id number]  Ortho Devices Type of Ortho Device: Lumbar corsett Ortho Device/Splint Interventions: Ordered   Post Interventions Instructions Provided: Adjustment of device, Care of device  Morna Pink 11/14/2023, 9:11 AM

## 2023-11-15 DIAGNOSIS — E872 Acidosis, unspecified: Secondary | ICD-10-CM | POA: Diagnosis not present

## 2023-11-15 DIAGNOSIS — A0472 Enterocolitis due to Clostridium difficile, not specified as recurrent: Secondary | ICD-10-CM | POA: Diagnosis not present

## 2023-11-15 DIAGNOSIS — N1832 Chronic kidney disease, stage 3b: Secondary | ICD-10-CM | POA: Diagnosis not present

## 2023-11-15 DIAGNOSIS — E876 Hypokalemia: Secondary | ICD-10-CM | POA: Diagnosis not present

## 2023-11-15 LAB — CBC WITH DIFFERENTIAL/PLATELET
Abs Immature Granulocytes: 0.34 10*3/uL — ABNORMAL HIGH (ref 0.00–0.07)
Basophils Absolute: 0.1 10*3/uL (ref 0.0–0.1)
Basophils Relative: 1 %
Eosinophils Absolute: 0.5 10*3/uL (ref 0.0–0.5)
Eosinophils Relative: 3 %
HCT: 36.2 % — ABNORMAL LOW (ref 39.0–52.0)
Hemoglobin: 11.9 g/dL — ABNORMAL LOW (ref 13.0–17.0)
Immature Granulocytes: 2 %
Lymphocytes Relative: 14 %
Lymphs Abs: 2 10*3/uL (ref 0.7–4.0)
MCH: 27.4 pg (ref 26.0–34.0)
MCHC: 32.9 g/dL (ref 30.0–36.0)
MCV: 83.4 fL (ref 80.0–100.0)
Monocytes Absolute: 1.5 10*3/uL — ABNORMAL HIGH (ref 0.1–1.0)
Monocytes Relative: 11 %
Neutro Abs: 9.7 10*3/uL — ABNORMAL HIGH (ref 1.7–7.7)
Neutrophils Relative %: 69 %
Platelets: 409 10*3/uL — ABNORMAL HIGH (ref 150–400)
RBC: 4.34 MIL/uL (ref 4.22–5.81)
RDW: 18.2 % — ABNORMAL HIGH (ref 11.5–15.5)
WBC: 14.2 10*3/uL — ABNORMAL HIGH (ref 4.0–10.5)
nRBC: 0 % (ref 0.0–0.2)

## 2023-11-15 LAB — BASIC METABOLIC PANEL WITH GFR
Anion gap: 10 (ref 5–15)
BUN: 10 mg/dL (ref 8–23)
CO2: 20 mmol/L — ABNORMAL LOW (ref 22–32)
Calcium: 8.3 mg/dL — ABNORMAL LOW (ref 8.9–10.3)
Chloride: 110 mmol/L (ref 98–111)
Creatinine, Ser: 1.33 mg/dL — ABNORMAL HIGH (ref 0.61–1.24)
GFR, Estimated: 54 mL/min — ABNORMAL LOW (ref >60)
Glucose, Bld: 122 mg/dL — ABNORMAL HIGH (ref 70–99)
Potassium: 3.4 mmol/L — ABNORMAL LOW (ref 3.5–5.1)
Sodium: 140 mmol/L (ref 135–145)

## 2023-11-15 LAB — GLUCOSE, CAPILLARY
Glucose-Capillary: 126 mg/dL — ABNORMAL HIGH (ref 70–99)
Glucose-Capillary: 154 mg/dL — ABNORMAL HIGH (ref 70–99)
Glucose-Capillary: 109 mg/dL — ABNORMAL HIGH (ref 70–99)
Glucose-Capillary: 147 mg/dL — ABNORMAL HIGH (ref 70–99)

## 2023-11-15 LAB — MAGNESIUM: Magnesium: 1.8 mg/dL (ref 1.7–2.4)

## 2023-11-15 MED ORDER — VANCOMYCIN HCL 125 MG PO CAPS
125.0000 mg | ORAL_CAPSULE | Freq: Four times a day (QID) | ORAL | Status: DC
  Administered 2023-11-15 – 2023-11-17 (×10): 125 mg via ORAL
  Filled 2023-11-15 (×12): qty 1

## 2023-11-15 MED ORDER — MAGNESIUM SULFATE 2 GM/50ML IV SOLN
2.0000 g | Freq: Once | INTRAVENOUS | Status: AC
  Administered 2023-11-15: 2 g via INTRAVENOUS
  Filled 2023-11-15: qty 50

## 2023-11-15 MED ORDER — POTASSIUM CHLORIDE CRYS ER 20 MEQ PO TBCR
40.0000 meq | EXTENDED_RELEASE_TABLET | ORAL | Status: AC
  Administered 2023-11-15 (×2): 40 meq via ORAL
  Filled 2023-11-15 (×2): qty 2

## 2023-11-15 NOTE — Progress Notes (Addendum)
 PROGRESS NOTE    Steve Jensen  FMW:[id number] DOB: January 03, 1941 DOA: 11/08/2023 PCP: Loreli Kins, MD  Subjective: Pt seen and examined.  Pt developed rash on right flank yesterday. Benadryl  has helped. Only 3 diarrhea stools recorded yesterday.  WBC 14.2K today. K 3.4, Scdr 1.33, Mg 1.2  Given rash that started after dificid , will change to po vanco qid.     Hospital Course: HPI: Steve Jensen is a 83 y.o. male with medical history significant of PAF on Eliquis , hypertension, chronic HFpEF, OSA on CPAP, obesity, type 2 diabetes, CKD stage IIIa, GERD, hepatocellular carcinoma seen by IR, history of prostate cancer, falls who presents after having acute respiratory distress.  History is obtained from review of records as patient is confused currently and states that he is here due to a sign.   Records note patient was being laid flat to be changed at which point he had sudden onset of emesis.  After vomiting patient turned blue and appeared to be choking with difficulty breathing and possibly turned blue.  He was noted to have O2 saturations as low as 86% for which she was placed on 2 L of oxygen  and EMS called to transport to the hospital.   He had just recently been hospitalized 5/31-6/6 for altered mental status after having a fall due to weakness with reported low blood pressures.  Patient was noted not to have significant orthostatic hypotension when checked.  Patient was found to have a acute compression fracture of L4 for which patient was recommended LSO brace and outpatient follow-up.   Over the phone the patient's son makes note that they had just recently seen his father yesterday and he was doing well and seemed alert and oriented.  He does admit that his father has intermittently gotten confused following the falls during prior hospitalizations.   In the emergency department patient was noted to be afebrile with heart rates elevated up to 124 in atrial fibrillation, blood  pressures noted to be as low as 88/53, and O2 saturations currently maintained on 2 L of nasal cannula oxygen .  Labs significant for WBC 32.3, platelets 595, potassium 2.7, BUN 32, creatinine 1.8, calcium  8.6, anion gap 16, and lactic acid 3.7-> 4.4-> 2.6.  Chest x-ray that showed no acute abnormality.  Urinalysis did not show significant signs for infection. Blood cultures were obtained.  Patient was given 4 L lactated Ringer 's, potassium chloride  40 meq p.o., vancomycin , metronidazole , and cefepime .  Significant Events: Admitted 11/08/2023 for lactic acidosis and SIRS   Admission Labs: Lactic acid 3.7 WBC 30.3, HgB 15.1, plt 595 Na 139, K 2.7, CO2 of 20, BUN 32, Scr 1.80, glu 191 UA negative nitrite, negative LE, rare bacteria BNP 187 Procal 0.16  Admission Imaging Studies: CXR No acute findings.   Significant Labs: C. Diff antigen Positive, C. Diff PCR Positive, C. Diff Toxin Negative  Significant Imaging Studies: 11-11-2023 CT abd negative for toxic megacolon.  Antibiotic Therapy: Anti-infectives (From admission, onward)    Start     Dose/Rate Route Frequency Ordered Stop   11/10/23 0400  vancomycin  (VANCOCIN ) IVPB 1000 mg/200 mL premix        1,000 mg 200 mL/hr over 60 Minutes Intravenous Every 24 hours 11/09/23 1228     11/09/23 1500  ceFEPIme  (MAXIPIME ) 2 g in sodium chloride  0.9 % 100 mL IVPB        2 g 200 mL/hr over 30 Minutes Intravenous Every 12 hours 11/09/23 1127     11/09/23  0145  vancomycin  (VANCOREADY) IVPB 2000 mg/400 mL        2,000 mg 200 mL/hr over 120 Minutes Intravenous  Once 11/09/23 0132 11/09/23 0449   11/09/23 0130  ceFEPIme  (MAXIPIME ) 2 g in sodium chloride  0.9 % 100 mL IVPB        2 g 200 mL/hr over 30 Minutes Intravenous  Once 11/09/23 0125 11/09/23 0323   11/09/23 0130  metroNIDAZOLE  (FLAGYL ) IVPB 500 mg        500 mg 100 mL/hr over 60 Minutes Intravenous  Once 11/09/23 0125 11/09/23 0245   11/09/23 0130  vancomycin  (VANCOCIN ) IVPB 1000 mg/200  mL premix  Status:  Discontinued        1,000 mg 200 mL/hr over 60 Minutes Intravenous  Once 11/09/23 0125 11/09/23 0132       Procedures:   Consultants:     Assessment and Plan: * Lactic acidosis-resolved as of 11/14/2023 11-10-2023 likely cause by C. Diff.  11-12-2023 resolved  C. difficile diarrhea 11-10-2023 pt without any other source for his leukocytosis. Dtr mentions that pt has had diarrhea for several days. C. Diff PCR positive. Although C diff toxin was negative, given that his has a dramatic leukocytosis without any other localizing source, I would consider this an acute C. Difff infection. Will start Dificid  tonight.  11-11-2023 continue with Dificid . Only has received 2 doses thus far. If no decrease after 4 doses, may need to change to po vanco. Follow WBC. Will check CT abd/pelvis to evaluate for toxic megacolon. Pt does not have a tender abd.  11-12-2023 Pt with rectal tube now due to liquid diarrhea. Metabolic acidosis improved with IVF and po bicarb. Still profoundly hypokalemic. IV Kcl ordered. Not surprising given on-going diarrhea. Mg level fortunately stable.  Fortunately, WBC down to 16K. CT abd yesterday negative for toxic megacolon.  Order Ensure Silvester TID to help get protein into him. Repeat Cbc in AM  11-13-2023 WBC down to 14K. No longer has rectal tube. Continue with po dificid  bid. If he stays on same trajectory, should be able to be discharged to SNF on Monday, June 23  11-14-2023 WBC stable at 13.9.  6 stools yesterday. WBC has responded to Dificid . Do not give any anti-diarrheals. Do not give Questran monitor stool output.  11-15-2023 may have developed drug rash on right flank from dificid . Will change to po vanco QID.  WBC stable at 14K.       Acute metabolic acidosis 11-11-2023 start po bicarb 1300 mg bid. Repeat BMP in AM.  11-12-2023 improved with IV and po bicarb. Continue with both while he still has on-going diarrhea. Repeat BMP in  aM.  11-13-2023 decrease po bicarb to 650 mg bid. Repeat BMP in AM. Now that he is no longer having massive diarrhea. Serum bicarb should be better tomorrow.  11-14-2023 stop bicarb. This should help with hypokalemia. Repeat BMP in Am.  11-15-2023 stable off po bicarb  Hypokalemia 11-10-2023 replete with IV Kcl and IV Mg. Repeat BMP and Mg in AM. His c. Diff diarrhea could certainly cause massive loss of K and Mg  11-11-2023 resolved after repletion.  11-12-2023 continue with IV Kcl replacement. Ongoing enteral losses due to diarrhea from C. Diff.  11-13-2023 continue with aggressive IV and po kcl replacement. Now that diarrhea has slowed down and decreasing po bicarb dose, hopefully K levels will be normal by tomorrow.  11-14-2023 due to on going GI losses from his c.diff diarrheal. After aggressive replacement for the  last 4 days, serum K is up to 3.3. will give more oral Kcl. Hopefully stopping po bicarb will also help.  11-15-2023 continue with oral placement. Approaching normal K levels.  Acute metabolic encephalopathy-resolved as of 11/11/2023 11-10-2023 due to C. Diff infection. Now resolved. Mentation back to normal.  Sepsis (HCC)-resolved as of 11/15/2023 11-10-2023 likely due to C. Diff. Present on admission. Had leukocytosis of 30K, lactic acid of 3.7, HR 124.  Gout 11-10-2023 through 11-15-2023 stable.  Lumbar compression fracture (HCC) 11-10-2023 through 11-15-2023 stable. Pt has lumbar brace at bedside.  CKD stage 3b, GFR 30-44 ml/min (HCC) 11-10-2023 baseline Scr 1.3-1.6  11-11-2023 stable. Scr 1.42 today.  11-12-2023 stable. Scr 1.34 today.  11-13-2023 stable. Scr 1.28, BUN 15 today.  11-14-2023 Scr 1.22, BUN 13 today.  11-15-2023 Scr 1.33, BUN 10 today.  Chronic heart failure with preserved ejection fraction (HFpEF) (HCC) 11-10-2023 stable. Holding lasix  for now due to volume depletion on admission.  11-11-2023 stable. Continue to hold  lasix .  11-12-2023 continue to hold lasix . On IVF for continued diarrhea.  11-13-2023 continue to hold lasix . Can stop IVF.  11-14-2203 continue to hold lasix . Continue with oral hydration. On Toprol -XL.  11-15-2023 continue to hold lasix . Diarrhea slowing down. Push oral hydration. On toprol -XL. No ARB due to CKD.  PAF (paroxysmal atrial fibrillation) (HCC) 11-10-2023 stable. Continue Eliquis . Restart Toprol   11-11-2023 stable. Continue Eliquis  and Toprol . If CT abd shows toxic megacolon, will need to stop Eliquis .  11-12-2023 stable. Continue eliquis  and Toprol -XL.  11-13-2023 stable  11-14-2023 stable. On Eliquis  2.5 mg bid and toprol -xl 50 mg bid.  11-15-2023 continue Eliquis  2.5 mg bid and Topro-xl 50 mg bid.  Morbid obesity (HCC) 11-10-2023 Body mass index is 40.27 kg/m.   OSA (obstructive sleep apnea) 11-10-2023 through 11-15-2023 stable  Type 2 diabetes mellitus (HCC) 11-10-2023 on SSI. Holding metformin .  11-12-2023 CBG acceptable. Continue with SSI.  11-13-2023 acceptable CBG ranges. On SSI. Still holding metformin  and Soliqua  11-14-2023 CBG accepting. On SSI.  11-15-2023 acceptable CBG. On SSI.  Hypomagnesemia-resolved as of 11/12/2023 11-10-2023 replete with IV Mg. Repeat Mg in AM.  His c. Diff diarrhea could certainly cause massive loss of K and Mg  11-11-2023 resolved with repletion. Mg today of 2.2  Thrombocytosis-resolved as of 11/11/2023 11-10-2023 likely due to c. Diff infection. Repeat CBC in AM. 11-11-2023 resolved. Plt down to 394K today.  Nausea and vomiting-resolved as of 11/12/2023 11-10-2023 stable.  11-11-2023 resolved.  Transient hypotension-resolved as of 11/12/2023 11-10-2023 stable. Off midodrine .  11-12-2023 resolved.      DVT prophylaxis: apixaban  (ELIQUIS ) tablet 2.5 mg Start: 11/09/23 1200 apixaban  (ELIQUIS ) tablet 2.5 mg     Code Status: Limited: Do not attempt resuscitation (DNR) -DNR-LIMITED -Do Not Intubate/DNI   Family Communication: no family at bedside Disposition Plan: SNF Reason for continuing need for hospitalization: approaching medical stability for DC to Snf.  Objective: Vitals:   11/15/23 0523 11/15/23 0806 11/15/23 0807 11/15/23 1230  BP:      Pulse: 98     Resp: 18 18  18   Temp: 98.1 F (36.7 C) 98.3 F (36.8 C)  98.2 F (36.8 C)  TempSrc: Oral Axillary  Oral  SpO2: 93%  94%   Weight: 124.2 kg     Height:        Intake/Output Summary (Last 24 hours) at 11/15/2023 1552 Last data filed at 11/15/2023 0523 Gross per 24 hour  Intake 480 ml  Output 1450 ml  Net -970 ml  Filed Weights   11/13/23 0401 11/14/23 0623 11/15/23 0523  Weight: 88.7 kg 124.3 kg 124.2 kg    Examination:  Physical Exam Vitals and nursing note reviewed.  Constitutional:      General: He is not in acute distress.    Appearance: He is obese. He is not toxic-appearing.  HENT:     Head: Normocephalic and atraumatic.     Nose: Nose normal.   Cardiovascular:     Rate and Rhythm: Normal rate. Rhythm irregular.  Pulmonary:     Effort: Pulmonary effort is normal.     Breath sounds: Normal breath sounds.  Abdominal:     General: There is no distension.     Palpations: Abdomen is soft.     Tenderness: There is no abdominal tenderness.   Musculoskeletal:     Right lower leg: No edema.     Left lower leg: No edema.   Skin:    General: Skin is warm and dry.     Capillary Refill: Capillary refill takes less than 2 seconds.   Neurological:     General: No focal deficit present.     Mental Status: He is alert and oriented to person, place, and time.     Data Reviewed: I have personally reviewed following labs and imaging studies  CBC: Recent Labs  Lab 11/11/23 0413 11/12/23 0404 11/13/23 0429 11/14/23 0527 11/15/23 0824  WBC 25.8* 16.2* 14.1* 13.9* 14.2*  NEUTROABS 20.8* 11.9* 9.7* 9.6* 9.7*  HGB 12.6* 12.0* 12.1* 12.2* 11.9*  HCT 38.9* 36.9* 36.9* 38.5* 36.2*  MCV 84.9 81.6 82.9  83.3 83.4  PLT 394 439* 429* 441* 409*   Basic Metabolic Panel: Recent Labs  Lab 11/11/23 0413 11/12/23 0404 11/13/23 0429 11/14/23 0527 11/15/23 0824  NA 138 139 138 139 140  K 4.1 2.7* 2.9* 3.3* 3.4*  CL 114* 110 111 109 110  CO2 11* 20* 20* 21* 20*  GLUCOSE 125* 134* 112* 127* 122*  BUN 21 19 15 13 10   CREATININE 1.42* 1.34* 1.28* 1.22 1.33*  CALCIUM  7.8* 8.2* 8.1* 8.3* 8.3*  MG 2.2 2.0 1.8 2.0 1.8   GFR: Estimated Creatinine Clearance: 56.7 mL/min (A) (by C-G formula based on SCr of 1.33 mg/dL (H)). Liver Function Tests: Recent Labs  Lab 11/09/23 0058 11/10/23 0535  AST 20 16  ALT 18 13  ALKPHOS 149* 94  BILITOT 0.6 0.9  PROT 5.9* 4.6*  ALBUMIN  2.8* 2.1*   Coagulation Profile: Recent Labs  Lab 11/09/23 0058  INR 1.2   BNP (last 3 results) Recent Labs    09/03/23 2103 10/24/23 1123 11/09/23 1048  BNP 152.3* 411.2* 187.8*   CBG: Recent Labs  Lab 11/14/23 1214 11/14/23 1627 11/14/23 2105 11/15/23 0805 11/15/23 1228  GLUCAP 169* 156* 175* 126* 154*   Sepsis Labs: Recent Labs  Lab 11/09/23 0825 11/09/23 0910 11/09/23 1048 11/09/23 1312 11/12/23 0404  PROCALCITON  --   --  0.16  --   --   LATICACIDVEN 2.6* 2.4*  --  1.8 1.0    Recent Results (from the past 240 hours)  Blood Culture (routine x 2)     Status: None   Collection Time: 11/09/23 12:10 AM   Specimen: BLOOD  Result Value Ref Range Status   Specimen Description BLOOD BLOOD LEFT FOREARM  Final   Special Requests   Final    BOTTLES DRAWN AEROBIC AND ANAEROBIC Blood Culture results may not be optimal due to an inadequate volume of blood received in  culture bottles   Culture   Final    NO GROWTH 5 DAYS Performed at [facility], [address], [place], [place] [address]    Report Status 11/14/2023 FINAL  Final  Blood Culture (routine x 2)     Status: None   Collection Time: 11/09/23 12:26 AM   Specimen: BLOOD  Result Value Ref Range Status   Specimen Description BLOOD  BLOOD RIGHT ARM AEROBIC BOTTLE ONLY  Final   Special Requests   Final    BOTTLES DRAWN AEROBIC ONLY Blood Culture adequate volume   Culture   Final    NO GROWTH 5 DAYS Performed at [facility], [address] [place], [place] [address]    Report Status 11/14/2023 FINAL  Final  C Difficile Quick Screen w PCR reflex     Status: Abnormal   Collection Time: 11/10/23  3:45 PM   Specimen: STOOL  Result Value Ref Range Status   C Diff antigen POSITIVE (A) NEGATIVE Final   C Diff toxin NEGATIVE NEGATIVE Final   C Diff interpretation Results are indeterminate. See PCR results.  Final    Comment: Performed at [facility], [address], [place], [place] [address]  C. Diff by PCR, Reflexed     Status: Abnormal   Collection Time: 11/10/23  3:45 PM  Result Value Ref Range Status   Toxigenic C. Difficile by PCR POSITIVE (A) NEGATIVE Final    Comment: Positive for toxigenic C. difficile with little to no toxin production. Only treat if clinical presentation suggests symptomatic illness. Performed at [facility], [address] [place], [place] [address]      Radiology Studies: No results found.  Scheduled Meds:  allopurinol   100 mg Oral q AM   apixaban   2.5 mg Oral BID   ezetimibe   10 mg Oral q AM   FLUoxetine   10 mg Oral QPM   insulin  aspart  0-9 Units Subcutaneous TID WC   metoprolol  succinate  50 mg Oral BID   pantoprazole   40 mg Oral Daily   potassium chloride   40 mEq Oral Q4H   pregabalin   50 mg Oral BID   sodium chloride  flush  3 mL Intravenous Q12H   traZODone   25 mg Oral QHS   vancomycin   125 mg Oral QID   Continuous Infusions:  magnesium  sulfate bolus IVPB       LOS: 6 days   Time spent: 55 minutes  Camellia Door, DO  [facility]  11/15/2023, 3:52 PM

## 2023-11-15 NOTE — Progress Notes (Signed)
 Patient noted to have new rash R flank area when he was turned. Patient states it just started itching in that area today. Rash is warm to touch and red/splotchy. Dr Laurence notified and orders received.

## 2023-11-15 NOTE — Plan of Care (Signed)

## 2023-11-15 NOTE — Progress Notes (Signed)
 Patient refused CPAP for the night

## 2023-11-16 ENCOUNTER — Ambulatory Visit (HOSPITAL_BASED_OUTPATIENT_CLINIC_OR_DEPARTMENT_OTHER): Admitting: Nurse Practitioner

## 2023-11-16 DIAGNOSIS — A0472 Enterocolitis due to Clostridium difficile, not specified as recurrent: Secondary | ICD-10-CM | POA: Diagnosis not present

## 2023-11-16 LAB — CBC WITH DIFFERENTIAL/PLATELET
Abs Immature Granulocytes: 0.33 10*3/uL — ABNORMAL HIGH (ref 0.00–0.07)
Basophils Absolute: 0.1 10*3/uL (ref 0.0–0.1)
Basophils Relative: 1 %
Eosinophils Absolute: 0.5 10*3/uL (ref 0.0–0.5)
Eosinophils Relative: 4 %
HCT: 36.2 % — ABNORMAL LOW (ref 39.0–52.0)
Hemoglobin: 11.6 g/dL — ABNORMAL LOW (ref 13.0–17.0)
Immature Granulocytes: 2 %
Lymphocytes Relative: 14 %
Lymphs Abs: 2 10*3/uL (ref 0.7–4.0)
MCH: 27.2 pg (ref 26.0–34.0)
MCHC: 32 g/dL (ref 30.0–36.0)
MCV: 84.8 fL (ref 80.0–100.0)
Monocytes Absolute: 1.7 10*3/uL — ABNORMAL HIGH (ref 0.1–1.0)
Monocytes Relative: 12 %
Neutro Abs: 10.3 10*3/uL — ABNORMAL HIGH (ref 1.7–7.7)
Neutrophils Relative %: 67 %
Platelets: 402 10*3/uL — ABNORMAL HIGH (ref 150–400)
RBC: 4.27 MIL/uL (ref 4.22–5.81)
RDW: 18.6 % — ABNORMAL HIGH (ref 11.5–15.5)
WBC: 14.9 10*3/uL — ABNORMAL HIGH (ref 4.0–10.5)
nRBC: 0 % (ref 0.0–0.2)

## 2023-11-16 LAB — BASIC METABOLIC PANEL WITH GFR
Anion gap: 5 (ref 5–15)
BUN: 11 mg/dL (ref 8–23)
CO2: 22 mmol/L (ref 22–32)
Calcium: 8.3 mg/dL — ABNORMAL LOW (ref 8.9–10.3)
Chloride: 113 mmol/L — ABNORMAL HIGH (ref 98–111)
Creatinine, Ser: 1.24 mg/dL (ref 0.61–1.24)
GFR, Estimated: 58 mL/min — ABNORMAL LOW (ref >60)
Glucose, Bld: 145 mg/dL — ABNORMAL HIGH (ref 70–99)
Potassium: 3.4 mmol/L — ABNORMAL LOW (ref 3.5–5.1)
Sodium: 140 mmol/L (ref 135–145)

## 2023-11-16 LAB — MAGNESIUM: Magnesium: 2.1 mg/dL (ref 1.7–2.4)

## 2023-11-16 LAB — GLUCOSE, CAPILLARY
Glucose-Capillary: 121 mg/dL — ABNORMAL HIGH (ref 70–99)
Glucose-Capillary: 113 mg/dL — ABNORMAL HIGH (ref 70–99)
Glucose-Capillary: 107 mg/dL — ABNORMAL HIGH (ref 70–99)
Glucose-Capillary: 104 mg/dL — ABNORMAL HIGH (ref 70–99)

## 2023-11-16 MED ORDER — TRIAMCINOLONE 0.1 % CREAM:EUCERIN CREAM 1:1
TOPICAL_CREAM | Freq: Three times a day (TID) | CUTANEOUS | Status: DC
  Filled 2023-11-16: qty 60

## 2023-11-16 MED ORDER — PREGABALIN 50 MG PO CAPS: 50.0000 mg | ORAL_CAPSULE | Freq: Two times a day (BID) | ORAL | 0 refills | Status: AC

## 2023-11-16 MED ORDER — CAMPHOR-MENTHOL 0.5-0.5 % EX LOTN: TOPICAL_LOTION | CUTANEOUS | Status: DC | PRN

## 2023-11-16 MED ORDER — TRIAMCINOLONE 0.1 % CREAM:EUCERIN CREAM 1:1: TOPICAL_CREAM | CUTANEOUS | Status: DC

## 2023-11-16 MED ORDER — POTASSIUM CHLORIDE CRYS ER 20 MEQ PO TBCR: EXTENDED_RELEASE_TABLET | ORAL | Status: AC

## 2023-11-16 MED ORDER — POTASSIUM CHLORIDE CRYS ER 20 MEQ PO TBCR
60.0000 meq | EXTENDED_RELEASE_TABLET | Freq: Once | ORAL | Status: AC
  Administered 2023-11-16: 60 meq via ORAL
  Filled 2023-11-16: qty 3

## 2023-11-16 MED ORDER — VANCOMYCIN HCL 125 MG PO CAPS: 125.0000 mg | ORAL_CAPSULE | Freq: Four times a day (QID) | ORAL | Status: AC

## 2023-11-16 NOTE — TOC Progression Note (Signed)
 Transition of Care [facility]) - Progression Note    Patient Details  Name: Steve Jensen MRN: [id number] Date of Birth: 1941-05-11  Transition of [facility]) CM/SW Contact  Robynn Eileen Hoose, RN Phone Number: 11/16/2023, 10:41 AM  Clinical Narrative:   Secure message to CSW regarding pt discharge. Auth to be submitted today.    Expected Discharge Plan: Skilled Nursing Facility Barriers to Discharge: Continued Medical Work up  Expected Discharge Plan and Services In-house Referral: Clinical Social Work     Living arrangements for the past 2 months:  (from [place] short term) Expected Discharge Date: 11/16/23                                     Social Determinants of Health (SDOH) Interventions SDOH Screenings   Food Insecurity: No Food Insecurity (11/10/2023)  Housing: Low Risk  (11/10/2023)  Transportation Needs: No Transportation Needs (11/10/2023)  Utilities: Not At Risk (11/10/2023)  Social Connections: Socially Isolated (11/10/2023)  Tobacco Use: Medium Risk (11/10/2023)    Readmission Risk Interventions    12/26/2021   12:51 PM 12/25/2021    3:31 PM 12/19/2021   12:39 PM  Readmission Risk Prevention Plan  Transportation Screening  Complete Complete  PCP or Specialist Appt within 5-7 Days Complete    Home Care Screening  Complete   Medication Review (RN CM)  Complete   HRI or Home Care Consult   Complete  Social Work Consult for Recovery Care Planning/Counseling   Complete  Palliative Care Screening   Not Applicable  Medication Review Oceanographer)   Complete

## 2023-11-16 NOTE — Progress Notes (Signed)
 Physical Therapy Treatment Patient Details Name: Steve Jensen MRN: [id number] DOB: 07-18-1940 Today's Date: 11/16/2023   History of Present Illness Patient is 83 y.o. male presenting to ED from [facility] after vomiting with concern for sepsis. Pt recently admitted 5/31-6/6 with A-fib and AMS after a recent fall resulting in L4 comp fx w/ retropulsion 3mm, LSO brace for management. PMH significant for Afib, HTN, chronic HF-EF, OSA on CPAP, DM II, CKD III, GERD, hepatocellular carcinoma, prostate cancer.    PT Comments  Pt diarrhea is not as frequent and pt is able to fully participate in PT today. Pt is limited in safe mobility by increased back pain, even with back brace, increased fear of falling and decreased strength, balance and endurance. Pt require mod A for rolling, Tyan-total A2 for transfers and totalAx2 for attempt at standing. D/c plans remain appropriate at this time. PT will continue to follow acutely.    If plan is discharge home, recommend the following: Help with stairs or ramp for entrance;Assist for transportation;Assistance with cooking/housework;Two people to help with walking and/or transfers;A lot of help with bathing/dressing/bathroom   Can travel by private vehicle     No  Equipment Recommendations  None recommended by PT       Precautions / Restrictions Precautions Precautions: Back;Fall Recall of Precautions/Restrictions: Impaired Precaution/Restrictions Comments: Enteric Precautions Required Braces or Orthoses: Spinal Brace Spinal Brace: Lumbar corset Restrictions Weight Bearing Restrictions Per Provider Order: No     Mobility  Bed Mobility Overal bed mobility: Needs Assistance Bed Mobility: Rolling Rolling: Used rails, Mod assist   Supine to sit: Marcoantonio assist, +2 for physical assistance Sit to supine: Total assist   General bed mobility comments: modA for coming onto side, maxAx2 for managing LE off bed and for bringing trunk to upright and pad  scoot to square hips to EoB totalAx2 for managment of LE back into bed    Transfers Overall transfer level: Needs assistance Equipment used: 2 person hand held assist Transfers: Sit to/from Stand, Bed to chair/wheelchair/BSC Sit to Stand: Total assist, +2 physical assistance          Lateral/Scoot Transfers: Ladale assist, +2 physical assistance General transfer comment: pt attempted to stand but is limited by increase posterior lean and flexed posture, partly due to fear of falling, pt unable to come to fully upright, maxAx2 for 4x lateral scoot of hips towards HoB       Balance Overall balance assessment: Needs assistance (Bed level session) Sitting-balance support: Bilateral upper extremity supported Sitting balance-Leahy Scale: Poor Sitting balance - Comments: modA for maintianing upright due to increased posterior lean Postural control: Posterior lean                                  Communication Communication Communication: No apparent difficulties  Cognition Arousal: Alert Behavior During Therapy: WFL for tasks assessed/performed   PT - Cognitive impairments: No apparent impairments                       PT - Cognition Comments: Pt A,Ox4. Following commands: Impaired Following commands impaired: Follows one step commands with increased time    Cueing Cueing Techniques: Verbal cues, Tactile cues     General Comments General comments (skin integrity, edema, etc.): pt with LSO in room attempted to fit and barely able to secure due to being too small, PT will contact ortho tech for  correct size      Pertinent Vitals/Pain Pain Assessment Pain Assessment: Faces Faces Pain Scale: Hurts a little bit Pain Location: back with bed mobility Pain Descriptors / Indicators: Grimacing, Guarding, Moaning Pain Intervention(s): Limited activity within patient's tolerance, Monitored during session, Repositioned     PT Goals (current goals can now be found  in the care plan section) Acute Rehab PT Goals Patient Stated Goal: Get out of bed PT Goal Formulation: Patient unable to participate in goal setting Time For Goal Achievement: 11/24/23 Potential to Achieve Goals: Fair Progress towards PT goals: Progressing toward goals    Frequency    Min 1X/week       AM-PAC PT 6 Clicks Mobility   Outcome Measure  Help needed turning from your back to your side while in a flat bed without using bedrails?: A Lot Help needed moving from lying on your back to sitting on the side of a flat bed without using bedrails?: Total Help needed moving to and from a bed to a chair (including a wheelchair)?: Total Help needed standing up from a chair using your arms (e.g., wheelchair or bedside chair)?: Total Help needed to walk in hospital room?: Total Help needed climbing 3-5 steps with a railing? : Total 6 Click Score: 7    End of Session   Activity Tolerance: Patient tolerated treatment well Patient left: in bed;with call bell/phone within reach;with bed alarm set Nurse Communication: Mobility status PT Visit Diagnosis: Unsteadiness on feet (R26.81);Other abnormalities of gait and mobility (R26.89);Repeated falls (R29.6);Muscle weakness (generalized) (M62.81);Difficulty in walking, not elsewhere classified (R26.2);Other symptoms and signs involving the nervous system (M70.101)     Time: 8945-8881 PT Time Calculation (min) (ACUTE ONLY): 24 min  Charges:    $Therapeutic Activity: 23-37 mins PT General Charges $$ ACUTE PT VISIT: 1 Visit                     Orena Cavazos B. Fleeta Lapidus PT, DPT Acute Rehabilitation Services Please use secure chat or  Call Office [phone]    Almarie KATHEE [facility] 11/16/2023, 11:29 AM

## 2023-11-16 NOTE — TOC Progression Note (Signed)
 Transition of Care [facility]) - Progression Note    Patient Details  Name: Steve Jensen MRN: [id number] Date of Birth: 04-19-1941  Transition of Care [facility]) CM/SW Contact  Lauraine FORBES Saa, LCSW Phone Number: 11/16/2023, 2:45 PM  Clinical Narrative:     2:46 PM CSW submitted SNF insurance authorization for [facility] which is currently pending (ID [id number]).  Expected Discharge Plan: Skilled Nursing Facility Barriers to Discharge: Continued Medical Work up  Expected Discharge Plan and Services In-house Referral: Clinical Social Work     Living arrangements for the past 2 months:  (from [place] short term) Expected Discharge Date: 11/16/23                                     Social Determinants of Health (SDOH) Interventions SDOH Screenings   Food Insecurity: No Food Insecurity (11/10/2023)  Housing: Low Risk  (11/10/2023)  Transportation Needs: No Transportation Needs (11/10/2023)  Utilities: Not At Risk (11/10/2023)  Social Connections: Socially Isolated (11/10/2023)  Tobacco Use: Medium Risk (11/10/2023)    Readmission Risk Interventions    12/26/2021   12:51 PM 12/25/2021    3:31 PM 12/19/2021   12:39 PM  Readmission Risk Prevention Plan  Transportation Screening  Complete Complete  PCP or Specialist Appt within 5-7 Days Complete    Home Care Screening  Complete   Medication Review (RN CM)  Complete   HRI or Home Care Consult   Complete  Social Work Consult for Recovery Care Planning/Counseling   Complete  Palliative Care Screening   Not Applicable  Medication Review Oceanographer)   Complete

## 2023-11-16 NOTE — Discharge Summary (Signed)
 [facility]  Physician Discharge Summary   Patient ID: Steve Jensen MRN: [id number] DOB/AGE: [birth year] 83 y.o.  Admit date: 11/08/2023 Discharge date:   11/16/2023   PCP: Loreli Kins, MD  DISCHARGE DIAGNOSES:  Active Problems:   Acute metabolic acidosis   C. difficile diarrhea   Hypokalemia   Type 2 diabetes mellitus (HCC)   OSA (obstructive sleep apnea)   Morbid obesity (HCC)   PAF (paroxysmal atrial fibrillation) (HCC)   Chronic heart failure with preserved ejection fraction (HFpEF) (HCC)   CKD stage 3b, GFR 30-44 ml/min (HCC)   Lumbar compression fracture (HCC)   Gout   RECOMMENDATIONS FOR OUTPATIENT FOLLOW UP: Please check basic metabolic panel and magnesium  level in 3 to 4 days    Home Health: SNF Equipment/Devices: None  CODE STATUS: DNR  DISCHARGE CONDITION: fair  Diet recommendation: Heart healthy  INITIAL HISTORY: 83 y.o. male with medical history significant of PAF on Eliquis , hypertension, chronic HFpEF, OSA on CPAP, obesity, type 2 diabetes, CKD stage IIIa, GERD, hepatocellular carcinoma seen by IR, history of prostate cancer, falls who presented after having acute respiratory distress.  Records note patient was being laid flat to be changed at which point he had sudden onset of emesis.  After vomiting patient turned blue and appeared to be choking with difficulty breathing and possibly turned blue.  He was noted to have O2 saturations as low as 86% for which she was placed on 2 L of oxygen  and EMS called to transport to the hospital. He had just recently been hospitalized 5/31-6/6 for altered mental status after having a fall due to weakness with reported low blood pressures.  Patient was noted not to have significant orthostatic hypotension when checked.  Patient was found to have a acute compression fracture of L4 for which patient was recommended LSO brace and outpatient follow-up. In the emergency department patient was noted to be afebrile  with heart rates elevated up to 124 in atrial fibrillation, blood pressures noted to be as low as 88/53, and O2 saturations currently maintained on 2 L of nasal cannula oxygen .  Labs significant for WBC 32.3, platelets 595, potassium 2.7, BUN 32, creatinine 1.8, calcium  8.6, anion gap 16, and lactic acid 3.7-> 4.4-> 2.6.  Chest x-ray that showed no acute abnormality.  Urinalysis did not show significant signs for infection. Blood cultures were obtained.  Patient was given 4 L lactated Ringer 's, potassium chloride  40 meq p.o., vancomycin , metronidazole , and cefepime .   HOSPITAL COURSE:   C. difficile diarrhea Patient was on significant leukocytosis.  He had diarrhea.  C. difficile testing was done.  C. difficile antigen was positive but toxin was negative.  PCR was positive.  Due to high likelihood of active infection patient was started on Dificid .  However he developed a rash from the Dificid  so it had to be changed over to vancomycin . Diarrhea appears to have significantly improved.  CT scan did not show any complicating features.  Abdomen is benign.  Tolerating his diet.    Drug Rash Secondary to Dificid .  Changed over to oral vancomycin .  Stable today.  Can use steroid cream as needed.  Lactic acidosis Resolved as of 11/14/2023. Likely caused by C. Diff.   Acute metabolic acidosis Resolved.     Hypokalemia Likely due to GI loss.  Will be supplemented prior to discharge.  Should be monitored in the skilled nursing facility.  Magnesium  is 2.1.     Acute metabolic encephalopathy Resolved as of 11/11/2023  Sepsis  Resolved as of 11/15/2023   Gout   Lumbar compression fracture (HCC) Pt has lumbar brace at bedside.   CKD stage 3b, GFR 30-44 ml/min (HCC) Renal function is stable.   Chronic heart failure with preserved ejection fraction (HFpEF) (HCC) Diuretics were held during his hospital stay due to significant diarrhea and concern for hypovolemia.  Can be resumed at discharge.   PAF  (paroxysmal atrial fibrillation) (HCC) Continue with home medications including Eliquis  and metoprolol .     Morbid obesity (HCC) Body mass index is 40.27 kg/m.   OSA (obstructive sleep apnea)    Type 2 diabetes mellitus (HCC) Stable.  Monitor CBGs.     Hypomagnesemia-resolved as of 11/12/2023   Thrombocytosis-resolved as of 11/11/2023   Nausea and vomiting-resolved as of 11/12/2023   Transient hypotension-resolved as of 11/12/2023     Patient is stable.  Okay for discharge to SNF.   PERTINENT LABS:  The results of significant diagnostics from this hospitalization (including imaging, microbiology, ancillary and laboratory) are listed below for reference.    Microbiology: Recent Results (from the past 240 hours)  Blood Culture (routine x 2)     Status: None   Collection Time: 11/09/23 12:10 AM   Specimen: BLOOD  Result Value Ref Range Status   Specimen Description BLOOD BLOOD LEFT FOREARM  Final   Special Requests   Final    BOTTLES DRAWN AEROBIC AND ANAEROBIC Blood Culture results may not be optimal due to an inadequate volume of blood received in culture bottles   Culture   Final    NO GROWTH 5 DAYS Performed at [facility], [address], [place], [place] [address]    Report Status 11/14/2023 FINAL  Final  Blood Culture (routine x 2)     Status: None   Collection Time: 11/09/23 12:26 AM   Specimen: BLOOD  Result Value Ref Range Status   Specimen Description BLOOD BLOOD RIGHT ARM AEROBIC BOTTLE ONLY  Final   Special Requests   Final    BOTTLES DRAWN AEROBIC ONLY Blood Culture adequate volume   Culture   Final    NO GROWTH 5 DAYS Performed at [facility], [address] [place], [place] [address]    Report Status 11/14/2023 FINAL  Final  C Difficile Quick Screen w PCR reflex     Status: Abnormal   Collection Time: 11/10/23  3:45 PM   Specimen: STOOL  Result Value Ref Range Status   C Diff antigen POSITIVE (A) NEGATIVE Final   C Diff toxin  NEGATIVE NEGATIVE Final   C Diff interpretation Results are indeterminate. See PCR results.  Final    Comment: Performed at [facility], [address], [place], [place] [address]  C. Diff by PCR, Reflexed     Status: Abnormal   Collection Time: 11/10/23  3:45 PM  Result Value Ref Range Status   Toxigenic C. Difficile by PCR POSITIVE (A) NEGATIVE Final    Comment: Positive for toxigenic C. difficile with little to no toxin production. Only treat if clinical presentation suggests symptomatic illness. Performed at [facility], [address] [place], [place] [address]      Labs:   Basic Metabolic Panel: Recent Labs  Lab 11/12/23 0404 11/13/23 0429 11/14/23 0527 11/15/23 0824 11/16/23 0601  NA 139 138 139 140 140  K 2.7* 2.9* 3.3* 3.4* 3.4*  CL 110 111 109 110 113*  CO2 20* 20* 21* 20* 22  GLUCOSE 134* 112*  127* 122* 145*  BUN 19 15 13 10 11   CREATININE 1.34* 1.28* 1.22 1.33* 1.24  CALCIUM  8.2* 8.1* 8.3* 8.3* 8.3*  MG 2.0 1.8 2.0 1.8 2.1   Liver Function Tests: Recent Labs  Lab 11/10/23 0535  AST 16  ALT 13  ALKPHOS 94  BILITOT 0.9  PROT 4.6*  ALBUMIN  2.1*    CBC: Recent Labs  Lab 11/12/23 0404 11/13/23 0429 11/14/23 0527 11/15/23 0824 11/16/23 0601  WBC 16.2* 14.1* 13.9* 14.2* 14.9*  NEUTROABS 11.9* 9.7* 9.6* 9.7* 10.3*  HGB 12.0* 12.1* 12.2* 11.9* 11.6*  HCT 36.9* 36.9* 38.5* 36.2* 36.2*  MCV 81.6 82.9 83.3 83.4 84.8  PLT 439* 429* 441* 409* 402*   BNP: BNP (last 3 results) Recent Labs    09/03/23 2103 10/24/23 1123 11/09/23 1048  BNP 152.3* 411.2* 187.8*    CBG: Recent Labs  Lab 11/15/23 0805 11/15/23 1228 11/15/23 1638 11/15/23 2043 11/16/23 0736  GLUCAP 126* 154* 109* 147* 121*     IMAGING STUDIES CT ABDOMEN PELVIS WO CONTRAST Result Date: 11/11/2023 EXAM: CT ABDOMEN AND PELVIS WITHOUT CONTRAST 11/11/2023 05:18:37 PM TECHNIQUE: CT of the abdomen and pelvis was performed without the administration of intravenous  contrast. Multiplanar reformatted images are provided for review. Automated exposure control, iterative reconstruction, and/or weight based adjustment of the mA/kV was utilized to reduce the radiation dose to as low as reasonably achievable. COMPARISON: MR abdomen dated 07/13/2023. CLINICAL HISTORY: Diarrhea; c. diff. evaluate for toxic megacolon. FINDINGS: LOWER CHEST: Mild patchy bilateral lower lobe opacities, favoring atelectasis. Trace bilateral pleural effusions. LIVER: Cirrhosis. Hepatic lesions on prior MR are poorly visualized due to motion degradation and lack of contrast (image 32). GALLBLADDER AND BILE DUCTS: Gallbladder is unremarkable. No biliary ductal dilatation. SPLEEN: No acute abnormality. PANCREAS: No acute abnormality. ADRENAL GLANDS: No acute abnormality. KIDNEYS, URETERS AND BLADDER: Stable 2.5 cm renal adenoma (image 24), benign, no follow-up is recommended. No stones in the kidneys or ureters. No hydronephrosis. No perinephric or periureteral stranding. Urinary bladder is unremarkable. GI AND BOWEL: Small hiatal hernia. The appendix is not discretely visualized. No colonic wall thickening or inflammatory changes to suggest toxic megacolon. Mild sigmoid diverticulosis, without evidence of diverticulitis. PERITONEUM AND RETROPERITONEUM: No ascites. No free air. VASCULATURE: Atherosclerotic calcifications of the abdominal aorta and branch vessels. LYMPH NODES: No lymphadenopathy. REPRODUCTIVE ORGANS: Status post prostatectomy. BONES AND SOFT TISSUES: Mild superior endplate changes at L1 and L4, chronic. No acute osseous abnormality. No focal soft tissue abnormality. IMPRESSION: 1. No CT evidence of toxic megacolon. 2. Cirrhosis. Hepatic lesions on prior MR poorly visualized due to motion degradation and lack of contrast. 3. Additional ancillary findings as above. Electronically signed by: Pinkie Pebbles MD 11/11/2023 08:01 PM EDT RP Workstation: [id number]   DG Chest Port 1 View Result  Date: 11/09/2023 EXAM: 1 VIEW XRAY OF THE CHEST 11/09/2023 12:24:00 AM COMPARISON: 10/24/2023 CLINICAL HISTORY: Questionable sepsis - evaluate for abnormality. sepsis FINDINGS: LUNGS AND PLEURA: Mild platelike scarring in the left mid lung. No focal pulmonary opacity. No pulmonary edema. No pleural effusion. No pneumothorax. HEART AND MEDIASTINUM: No acute abnormality of the cardiac and mediastinal silhouettes. Thoracic aortic atherosclerosis. BONES AND SOFT TISSUES: Old right clavicle fracture. No acute osseous abnormality. IMPRESSION: 1. No acute findings. Electronically signed by: Pinkie Pebbles MD 11/09/2023 12:30 AM EDT RP Workstation: [id number]    DISCHARGE EXAMINATION: Vitals:   11/15/23 2242 11/15/23 2334 11/16/23 0421 11/16/23 0734  BP: 126/76 118/85 (!) 112/91 104/72  Pulse: 94 99 80  Resp:  18 18 16   Temp:  98.6 F (37 C) 97.9 F (36.6 C) (!) 97.4 F (36.3 C)  TempSrc:  Oral Oral Oral  SpO2:  92% 96% 96%  Weight:   124.7 kg   Height:       General appearance: Awake alert.  In no distress Resp: Clear to auscultation bilaterally.  Normal effort Cardio: S1-S2 is normal regular.  No S3-S4.  No rubs murmurs or bruit GI: Abdomen is soft.  Nontender nondistended.  Bowel sounds are present normal.  No masses organomegaly   DISPOSITION: SNF  Discharge Instructions     Call MD for:  difficulty breathing, headache or visual disturbances   Complete by: As directed    Call MD for:  extreme fatigue   Complete by: As directed    Call MD for:  persistant dizziness or light-headedness   Complete by: As directed    Call MD for:  persistant nausea and vomiting   Complete by: As directed    Call MD for:  severe uncontrolled pain   Complete by: As directed    Call MD for:  temperature >100.4   Complete by: As directed    Diet - low sodium heart healthy   Complete by: As directed    Discharge instructions   Complete by: As directed    Please review instructions on the discharge  summary.  You were cared for by a hospitalist during your hospital stay. If you have any questions about your discharge medications or the care you received while you were in the hospital after you are discharged, you can call the unit and asked to speak with the hospitalist on call if the hospitalist that took care of you is not available. Once you are discharged, your primary care physician will handle any further medical issues. Please note that NO REFILLS for any discharge medications will be authorized once you are discharged, as it is imperative that you return to your primary care physician (or establish a relationship with a primary care physician if you do not have one) for your aftercare needs so that they can reassess your need for medications and monitor your lab values. If you do not have a primary care physician, you can call [phone] for a physician referral.   Increase activity slowly   Complete by: As directed    No wound care   Complete by: As directed           Allergies as of 11/16/2023       Reactions   Lipitor [atorvastatin] Other (See Comments)   Myalgias   Zithromax [azithromycin] Other (See Comments)   GI intolerance   Zocor [simvastatin] Other (See Comments)   Myalgias    Norvasc  [amlodipine ] Swelling        Medication List     STOP taking these medications    docusate sodium  100 MG capsule Commonly known as: COLACE   ketoconazole 2 % cream Commonly known as: NIZORAL   polyethylene glycol 17 g packet Commonly known as: MIRALAX  / GLYCOLAX    senna 8.6 MG Tabs tablet Commonly known as: SENOKOT       TAKE these medications    allopurinol  100 MG tablet Commonly known as: ZYLOPRIM  Take 100 mg by mouth in the morning.   apixaban  2.5 MG Tabs tablet Commonly known as: ELIQUIS  Take 1 tablet (2.5 mg total) by mouth 2 (two) times daily. TAKE 1 TABLET(5 MG) BY MOUTH TWICE DAILY   ezetimibe  10 MG  tablet Commonly known as: ZETIA  Take 10 mg by  mouth in the morning.   ferrous sulfate 325 (65 FE) MG tablet Take 325 mg by mouth daily with breakfast.   Fish Oil 1000 MG Caps Take 1,000 mg by mouth 2 (two) times daily.   FLUoxetine  10 MG capsule Commonly known as: PROZAC  Take 10 mg by mouth every evening.   furosemide  40 MG tablet Commonly known as: LASIX  Take 40 mg by mouth in the morning.   guaiFENesin  600 MG 12 hr tablet Commonly known as: MUCINEX  Take 600 mg by mouth 2 (two) times daily as needed for cough or to loosen phlegm.   insulin  aspart 100 UNIT/ML injection Commonly known as: novoLOG  Inject 0-10 Units into the skin 3 (three) times daily before meals. Sliding scale: if sugar is 150-200 = 2 units, if 201-250 = 4 units, if 251-300 = 6 units, if 301-350 = 8 units, if 351-400 = 10 units, give blood sugar is greater than 400 then call the MD.   metFORMIN  1000 MG tablet Commonly known as: GLUCOPHAGE  Take 1,000 mg by mouth 2 (two) times daily with a meal.   metoprolol  succinate 100 MG 24 hr tablet Commonly known as: TOPROL -XL Take 1 tablet (100 mg total) by mouth 2 (two) times daily. Take with or immediately following a meal.   Multivitamin Men 50+ Tabs Take 1 tablet by mouth every evening.   omeprazole  20 MG capsule Commonly known as: PRILOSEC Take 1 capsule (20 mg total) by mouth daily. What changed: when to take this   potassium chloride  SA 20 MEQ tablet Commonly known as: KLOR-CON  M Please take 40 mEq daily for 5 days and then 20 mEq daily What changed:  medication strength how much to take how to take this when to take this additional instructions   pregabalin  50 MG capsule Commonly known as: LYRICA  Take 1 capsule (50 mg total) by mouth 2 (two) times daily.   Soliqua 100-33 UNT-MCG/ML Sopn Generic drug: Insulin  Glargine-Lixisenatide Inject 22 Units into the skin in the morning.   traZODone  50 MG tablet Commonly known as: DESYREL  Take 25 mg by mouth at bedtime.   triamcinolone  0.1 % cream :  eucerin Crea Apply on the rash 3 times a day for 5 days.   vancomycin  125 MG capsule Commonly known as: VANCOCIN  Take 1 capsule (125 mg total) by mouth 4 (four) times daily for 14 days.   Vitamin D 50 MCG (2000 UT) Caps Take 2,000 Units by mouth in the morning.          Follow-up Information     Loreli Kins, MD. Schedule an appointment as soon as possible for a visit in 1 week(s).   Specialty: Family Medicine Why: post hospitalization follow up Contact information: [address] [place] [address] [phone]                 TOTAL DISCHARGE TIME: 35 minutes  Kaitlan Bin Verdene  [facility] Pager on [email or web address]  11/16/2023, 10:05 AM

## 2023-11-17 DIAGNOSIS — N189 Chronic kidney disease, unspecified: Secondary | ICD-10-CM | POA: Diagnosis not present

## 2023-11-17 DIAGNOSIS — Z9981 Dependence on supplemental oxygen: Secondary | ICD-10-CM | POA: Diagnosis not present

## 2023-11-17 DIAGNOSIS — K5909 Other constipation: Secondary | ICD-10-CM | POA: Diagnosis not present

## 2023-11-17 DIAGNOSIS — L97521 Non-pressure chronic ulcer of other part of left foot limited to breakdown of skin: Secondary | ICD-10-CM | POA: Diagnosis not present

## 2023-11-17 DIAGNOSIS — Z741 Need for assistance with personal care: Secondary | ICD-10-CM | POA: Diagnosis not present

## 2023-11-17 DIAGNOSIS — R2681 Unsteadiness on feet: Secondary | ICD-10-CM | POA: Diagnosis not present

## 2023-11-17 DIAGNOSIS — Z743 Need for continuous supervision: Secondary | ICD-10-CM | POA: Diagnosis not present

## 2023-11-17 DIAGNOSIS — A414 Sepsis due to anaerobes: Secondary | ICD-10-CM | POA: Diagnosis not present

## 2023-11-17 DIAGNOSIS — R404 Transient alteration of awareness: Secondary | ICD-10-CM | POA: Diagnosis not present

## 2023-11-17 DIAGNOSIS — E785 Hyperlipidemia, unspecified: Secondary | ICD-10-CM | POA: Diagnosis not present

## 2023-11-17 DIAGNOSIS — W19XXXD Unspecified fall, subsequent encounter: Secondary | ICD-10-CM | POA: Diagnosis not present

## 2023-11-17 DIAGNOSIS — I10 Essential (primary) hypertension: Secondary | ICD-10-CM | POA: Diagnosis not present

## 2023-11-17 DIAGNOSIS — E8721 Acute metabolic acidosis: Secondary | ICD-10-CM | POA: Diagnosis not present

## 2023-11-17 DIAGNOSIS — R5381 Other malaise: Secondary | ICD-10-CM | POA: Diagnosis not present

## 2023-11-17 DIAGNOSIS — N179 Acute kidney failure, unspecified: Secondary | ICD-10-CM | POA: Diagnosis not present

## 2023-11-17 DIAGNOSIS — L0390 Cellulitis, unspecified: Secondary | ICD-10-CM | POA: Diagnosis not present

## 2023-11-17 DIAGNOSIS — R55 Syncope and collapse: Secondary | ICD-10-CM | POA: Diagnosis not present

## 2023-11-17 DIAGNOSIS — R0902 Hypoxemia: Secondary | ICD-10-CM | POA: Diagnosis not present

## 2023-11-17 DIAGNOSIS — I130 Hypertensive heart and chronic kidney disease with heart failure and stage 1 through stage 4 chronic kidney disease, or unspecified chronic kidney disease: Secondary | ICD-10-CM | POA: Diagnosis not present

## 2023-11-17 DIAGNOSIS — M6281 Muscle weakness (generalized): Secondary | ICD-10-CM | POA: Diagnosis not present

## 2023-11-17 DIAGNOSIS — K219 Gastro-esophageal reflux disease without esophagitis: Secondary | ICD-10-CM | POA: Diagnosis not present

## 2023-11-17 DIAGNOSIS — N1831 Chronic kidney disease, stage 3a: Secondary | ICD-10-CM | POA: Diagnosis not present

## 2023-11-17 DIAGNOSIS — D649 Anemia, unspecified: Secondary | ICD-10-CM | POA: Diagnosis not present

## 2023-11-17 DIAGNOSIS — E559 Vitamin D deficiency, unspecified: Secondary | ICD-10-CM | POA: Diagnosis not present

## 2023-11-17 DIAGNOSIS — E875 Hyperkalemia: Secondary | ICD-10-CM | POA: Diagnosis not present

## 2023-11-17 DIAGNOSIS — E119 Type 2 diabetes mellitus without complications: Secondary | ICD-10-CM | POA: Diagnosis not present

## 2023-11-17 DIAGNOSIS — I4891 Unspecified atrial fibrillation: Secondary | ICD-10-CM | POA: Diagnosis not present

## 2023-11-17 DIAGNOSIS — Z9181 History of falling: Secondary | ICD-10-CM | POA: Diagnosis not present

## 2023-11-17 DIAGNOSIS — R1314 Dysphagia, pharyngoesophageal phase: Secondary | ICD-10-CM | POA: Diagnosis not present

## 2023-11-17 DIAGNOSIS — I482 Chronic atrial fibrillation, unspecified: Secondary | ICD-10-CM | POA: Diagnosis not present

## 2023-11-17 DIAGNOSIS — G629 Polyneuropathy, unspecified: Secondary | ICD-10-CM | POA: Diagnosis not present

## 2023-11-17 DIAGNOSIS — E876 Hypokalemia: Secondary | ICD-10-CM | POA: Diagnosis not present

## 2023-11-17 DIAGNOSIS — G4700 Insomnia, unspecified: Secondary | ICD-10-CM | POA: Diagnosis not present

## 2023-11-17 DIAGNOSIS — Z7401 Bed confinement status: Secondary | ICD-10-CM | POA: Diagnosis not present

## 2023-11-17 DIAGNOSIS — I5030 Unspecified diastolic (congestive) heart failure: Secondary | ICD-10-CM | POA: Diagnosis not present

## 2023-11-17 DIAGNOSIS — S32040D Wedge compression fracture of fourth lumbar vertebra, subsequent encounter for fracture with routine healing: Secondary | ICD-10-CM | POA: Diagnosis not present

## 2023-11-17 DIAGNOSIS — K567 Ileus, unspecified: Secondary | ICD-10-CM | POA: Diagnosis not present

## 2023-11-17 DIAGNOSIS — I5032 Chronic diastolic (congestive) heart failure: Secondary | ICD-10-CM | POA: Diagnosis not present

## 2023-11-17 DIAGNOSIS — Z51A Encounter for sepsis aftercare: Secondary | ICD-10-CM | POA: Diagnosis not present

## 2023-11-17 DIAGNOSIS — E872 Acidosis, unspecified: Secondary | ICD-10-CM | POA: Diagnosis not present

## 2023-11-17 DIAGNOSIS — C220 Liver cell carcinoma: Secondary | ICD-10-CM | POA: Diagnosis not present

## 2023-11-17 DIAGNOSIS — R278 Other lack of coordination: Secondary | ICD-10-CM | POA: Diagnosis not present

## 2023-11-17 LAB — GLUCOSE, CAPILLARY
Glucose-Capillary: 95 mg/dL (ref 70–99)
Glucose-Capillary: 110 mg/dL — ABNORMAL HIGH (ref 70–99)

## 2023-11-17 LAB — BASIC METABOLIC PANEL WITH GFR
Anion gap: 9 (ref 5–15)
BUN: 8 mg/dL (ref 8–23)
CO2: 20 mmol/L — ABNORMAL LOW (ref 22–32)
Calcium: 8.4 mg/dL — ABNORMAL LOW (ref 8.9–10.3)
Chloride: 110 mmol/L (ref 98–111)
Creatinine, Ser: 1.34 mg/dL — ABNORMAL HIGH (ref 0.61–1.24)
GFR, Estimated: 53 mL/min — ABNORMAL LOW (ref >60)
Glucose, Bld: 112 mg/dL — ABNORMAL HIGH (ref 70–99)
Potassium: 3.7 mmol/L (ref 3.5–5.1)
Sodium: 139 mmol/L (ref 135–145)

## 2023-11-17 LAB — MAGNESIUM: Magnesium: 1.9 mg/dL (ref 1.7–2.4)

## 2023-11-17 MED ORDER — POTASSIUM CHLORIDE CRYS ER 20 MEQ PO TBCR
60.0000 meq | EXTENDED_RELEASE_TABLET | Freq: Once | ORAL | Status: AC
  Administered 2023-11-17: 60 meq via ORAL
  Filled 2023-11-17: qty 3

## 2023-11-17 NOTE — Discharge Summary (Signed)
 [facility]  Physician Discharge Summary   Patient ID: Steve Jensen MRN: [id number] DOB/AGE: January 26, 1941 83 y.o.  Admit date: 11/08/2023 Discharge date:   11/17/2023   PCP: Loreli Kins, MD  DISCHARGE DIAGNOSES:  Active Problems:   Acute metabolic acidosis   C. difficile diarrhea   Hypokalemia   Type 2 diabetes mellitus (HCC)   OSA (obstructive sleep apnea)   Morbid obesity (HCC)   PAF (paroxysmal atrial fibrillation) (HCC)   Chronic heart failure with preserved ejection fraction (HFpEF) (HCC)   CKD stage 3b, GFR 30-44 ml/min (HCC)   Lumbar compression fracture (HCC)   Gout   RECOMMENDATIONS FOR OUTPATIENT FOLLOW UP: Please check CBC, basic metabolic panel and magnesium  level in 3 to 4 days    Home Health: SNF Equipment/Devices: None  CODE STATUS: DNR  DISCHARGE CONDITION: fair  Diet recommendation: Heart healthy  INITIAL HISTORY: 83 y.o. male with medical history significant of PAF on Eliquis , hypertension, chronic HFpEF, OSA on CPAP, obesity, type 2 diabetes, CKD stage IIIa, GERD, hepatocellular carcinoma seen by IR, history of prostate cancer, falls who presented after having acute respiratory distress.  Records note patient was being laid flat to be changed at which point he had sudden onset of emesis.  After vomiting patient turned blue and appeared to be choking with difficulty breathing and possibly turned blue.  He was noted to have O2 saturations as low as 86% for which she was placed on 2 L of oxygen  and EMS called to transport to the hospital. He had just recently been hospitalized 5/31-6/6 for altered mental status after having a fall due to weakness with reported low blood pressures.  Patient was noted not to have significant orthostatic hypotension when checked.  Patient was found to have a acute compression fracture of L4 for which patient was recommended LSO brace and outpatient follow-up. In the emergency department patient was noted to be  afebrile with heart rates elevated up to 124 in atrial fibrillation, blood pressures noted to be as low as 88/53, and O2 saturations currently maintained on 2 L of nasal cannula oxygen .  Labs significant for WBC 32.3, platelets 595, potassium 2.7, BUN 32, creatinine 1.8, calcium  8.6, anion gap 16, and lactic acid 3.7-> 4.4-> 2.6.  Chest x-ray that showed no acute abnormality.  Urinalysis did not show significant signs for infection. Blood cultures were obtained.  Patient was given 4 L lactated Ringer 's, potassium chloride  40 meq p.o., vancomycin , metronidazole , and cefepime .   HOSPITAL COURSE:   C. difficile diarrhea Patient was on significant leukocytosis.  He had diarrhea.  C. difficile testing was done.  C. difficile antigen was positive but toxin was negative.  PCR was positive.  Due to high likelihood of active infection patient was started on Dificid .  However he developed a rash from the Dificid  so it had to be changed over to vancomycin . Diarrhea appears to have significantly improved.  CT scan did not show any complicating features.  Abdomen is benign.  Tolerating his diet.    Drug Rash Secondary to Dificid .  Changed over to oral vancomycin .  Rash is improving.  Can use steroid cream as needed.  Lactic acidosis Resolved as of 11/14/2023. Likely caused by C. Diff.   Acute metabolic acidosis Resolved.     Hypokalemia Likely due to GI loss.  Supplemented.  Acute metabolic encephalopathy Resolved as of 11/11/2023  Sepsis Resolved as of 11/15/2023   Gout   Lumbar compression fracture (HCC) Pt has lumbar brace at bedside.  CKD stage 3b, GFR 30-44 ml/min (HCC) Renal function is stable.   Chronic heart failure with preserved ejection fraction (HFpEF) (HCC) Diuretics were held during his hospital stay due to significant diarrhea and concern for hypovolemia.  Can be resumed at discharge.   PAF (paroxysmal atrial fibrillation) (HCC) Continue with home medications including Eliquis   and metoprolol .     Morbid obesity (HCC) Body mass index is 40.27 kg/m.   OSA (obstructive sleep apnea)    Type 2 diabetes mellitus (HCC) Stable.  Monitor CBGs.     Hypomagnesemia-resolved as of 11/12/2023   Thrombocytosis-resolved as of 11/11/2023   Nausea and vomiting-resolved as of 11/12/2023   Transient hypotension-resolved as of 11/12/2023   Patient remains stable.  Denies any complaints this morning.  Looking forward to going to rehab.  Okay for discharge to SNF.   PERTINENT LABS:  The results of significant diagnostics from this hospitalization (including imaging, microbiology, ancillary and laboratory) are listed below for reference.    Microbiology: Recent Results (from the past 240 hours)  Blood Culture (routine x 2)     Status: None   Collection Time: 11/09/23 12:10 AM   Specimen: BLOOD  Result Value Ref Range Status   Specimen Description BLOOD BLOOD LEFT FOREARM  Final   Special Requests   Final    BOTTLES DRAWN AEROBIC AND ANAEROBIC Blood Culture results may not be optimal due to an inadequate volume of blood received in culture bottles   Culture   Final    NO GROWTH 5 DAYS Performed at [facility], [address] [place], [place] [address]    Report Status 11/14/2023 FINAL  Final  Blood Culture (routine x 2)     Status: None   Collection Time: 11/09/23 12:26 AM   Specimen: BLOOD  Result Value Ref Range Status   Specimen Description BLOOD BLOOD RIGHT ARM AEROBIC BOTTLE ONLY  Final   Special Requests   Final    BOTTLES DRAWN AEROBIC ONLY Blood Culture adequate volume   Culture   Final    NO GROWTH 5 DAYS Performed at [facility], [address], [place], [place] [address]    Report Status 11/14/2023 FINAL  Final  C Difficile Quick Screen w PCR reflex     Status: Abnormal   Collection Time: 11/10/23  3:45 PM   Specimen: STOOL  Result Value Ref Range Status   C Diff antigen POSITIVE (A) NEGATIVE Final   C Diff toxin NEGATIVE NEGATIVE  Final   C Diff interpretation Results are indeterminate. See PCR results.  Final    Comment: Performed at [facility], [address], [place], [place] [address]  C. Diff by PCR, Reflexed     Status: Abnormal   Collection Time: 11/10/23  3:45 PM  Result Value Ref Range Status   Toxigenic C. Difficile by PCR POSITIVE (A) NEGATIVE Final    Comment: Positive for toxigenic C. difficile with little to no toxin production. Only treat if clinical presentation suggests symptomatic illness. Performed at [facility], [address], [place], [place] [address]      Labs:   Basic Metabolic Panel: Recent Labs  Lab 11/13/23 0429 11/14/23 0527 11/15/23 0824 11/16/23 0601 11/17/23 0408  NA 138 139 140 140 139  K 2.9* 3.3* 3.4* 3.4* 3.7  CL 111 109 110 113* 110  CO2 20* 21* 20* 22 20*  GLUCOSE 112* 127* 122* 145* 112*  BUN 15 13 10 11  8  CREATININE 1.28* 1.22 1.33* 1.24 1.34*  CALCIUM  8.1* 8.3* 8.3* 8.3* 8.4*  MG 1.8 2.0 1.8 2.1 1.9    CBC: Recent Labs  Lab 11/12/23 0404 11/13/23 0429 11/14/23 0527 11/15/23 0824 11/16/23 0601  WBC 16.2* 14.1* 13.9* 14.2* 14.9*  NEUTROABS 11.9* 9.7* 9.6* 9.7* 10.3*  HGB 12.0* 12.1* 12.2* 11.9* 11.6*  HCT 36.9* 36.9* 38.5* 36.2* 36.2*  MCV 81.6 82.9 83.3 83.4 84.8  PLT 439* 429* 441* 409* 402*   BNP: BNP (last 3 results) Recent Labs    09/03/23 2103 10/24/23 1123 11/09/23 1048  BNP 152.3* 411.2* 187.8*    CBG: Recent Labs  Lab 11/16/23 0736 11/16/23 1153 11/16/23 1612 11/16/23 2130 11/17/23 0830  GLUCAP 121* 113* 107* 104* 95     IMAGING STUDIES CT ABDOMEN PELVIS WO CONTRAST Result Date: 11/11/2023 EXAM: CT ABDOMEN AND PELVIS WITHOUT CONTRAST 11/11/2023 05:18:37 PM TECHNIQUE: CT of the abdomen and pelvis was performed without the administration of intravenous contrast. Multiplanar reformatted images are provided for review. Automated exposure control, iterative reconstruction, and/or weight based adjustment of the  mA/kV was utilized to reduce the radiation dose to as low as reasonably achievable. COMPARISON: MR abdomen dated 07/13/2023. CLINICAL HISTORY: Diarrhea; c. diff. evaluate for toxic megacolon. FINDINGS: LOWER CHEST: Mild patchy bilateral lower lobe opacities, favoring atelectasis. Trace bilateral pleural effusions. LIVER: Cirrhosis. Hepatic lesions on prior MR are poorly visualized due to motion degradation and lack of contrast (image 32). GALLBLADDER AND BILE DUCTS: Gallbladder is unremarkable. No biliary ductal dilatation. SPLEEN: No acute abnormality. PANCREAS: No acute abnormality. ADRENAL GLANDS: No acute abnormality. KIDNEYS, URETERS AND BLADDER: Stable 2.5 cm renal adenoma (image 24), benign, no follow-up is recommended. No stones in the kidneys or ureters. No hydronephrosis. No perinephric or periureteral stranding. Urinary bladder is unremarkable. GI AND BOWEL: Small hiatal hernia. The appendix is not discretely visualized. No colonic wall thickening or inflammatory changes to suggest toxic megacolon. Mild sigmoid diverticulosis, without evidence of diverticulitis. PERITONEUM AND RETROPERITONEUM: No ascites. No free air. VASCULATURE: Atherosclerotic calcifications of the abdominal aorta and branch vessels. LYMPH NODES: No lymphadenopathy. REPRODUCTIVE ORGANS: Status post prostatectomy. BONES AND SOFT TISSUES: Mild superior endplate changes at L1 and L4, chronic. No acute osseous abnormality. No focal soft tissue abnormality. IMPRESSION: 1. No CT evidence of toxic megacolon. 2. Cirrhosis. Hepatic lesions on prior MR poorly visualized due to motion degradation and lack of contrast. 3. Additional ancillary findings as above. Electronically signed by: Pinkie Pebbles MD 11/11/2023 08:01 PM EDT RP Workstation: [id number]   DG Chest Port 1 View Result Date: 11/09/2023 EXAM: 1 VIEW XRAY OF THE CHEST 11/09/2023 12:24:00 AM COMPARISON: 10/24/2023 CLINICAL HISTORY: Questionable sepsis - evaluate for abnormality.  sepsis FINDINGS: LUNGS AND PLEURA: Mild platelike scarring in the left mid lung. No focal pulmonary opacity. No pulmonary edema. No pleural effusion. No pneumothorax. HEART AND MEDIASTINUM: No acute abnormality of the cardiac and mediastinal silhouettes. Thoracic aortic atherosclerosis. BONES AND SOFT TISSUES: Old right clavicle fracture. No acute osseous abnormality. IMPRESSION: 1. No acute findings. Electronically signed by: Pinkie Pebbles MD 11/09/2023 12:30 AM EDT RP Workstation: [id number]    DISCHARGE EXAMINATION: Vitals:   11/16/23 1928 11/16/23 2220 11/17/23 0015 11/17/23 0516  BP: 128/67  120/78 127/70  Pulse: 87 95 93 87  Resp: 18  17 18   Temp: 98.6 F (37 C)  98.8 F (37.1 C) 97.9 F (36.6 C)  TempSrc: Oral  Oral Oral  SpO2: 96%  96% 95%  Weight:    123.4 kg  Height:       General appearance: Awake alert.  In no distress Resp: Clear to auscultation bilaterally.  Normal effort Cardio: S1-S2 is normal regular.  No S3-S4.  No rubs murmurs or bruit GI: Abdomen is soft.  Nontender nondistended.  Bowel sounds are present normal.  No masses organomegaly  DISPOSITION: SNF  Discharge Instructions     Call MD for:  difficulty breathing, headache or visual disturbances   Complete by: As directed    Call MD for:  extreme fatigue   Complete by: As directed    Call MD for:  persistant dizziness or light-headedness   Complete by: As directed    Call MD for:  persistant nausea and vomiting   Complete by: As directed    Call MD for:  severe uncontrolled pain   Complete by: As directed    Call MD for:  temperature >100.4   Complete by: As directed    Diet - low sodium heart healthy   Complete by: As directed    Discharge instructions   Complete by: As directed    Please review instructions on the discharge summary.  You were cared for by a hospitalist during your hospital stay. If you have any questions about your discharge medications or the care you received while you were  in the hospital after you are discharged, you can call the unit and asked to speak with the hospitalist on call if the hospitalist that took care of you is not available. Once you are discharged, your primary care physician will handle any further medical issues. Please note that NO REFILLS for any discharge medications will be authorized once you are discharged, as it is imperative that you return to your primary care physician (or establish a relationship with a primary care physician if you do not have one) for your aftercare needs so that they can reassess your need for medications and monitor your lab values. If you do not have a primary care physician, you can call [phone] for a physician referral.   Increase activity slowly   Complete by: As directed    No wound care   Complete by: As directed           Allergies as of 11/17/2023       Reactions   Lipitor [atorvastatin] Other (See Comments)   Myalgias   Zithromax [azithromycin] Other (See Comments)   GI intolerance   Zocor [simvastatin] Other (See Comments)   Myalgias    Dificid  [fidaxomicin ] Rash   Norvasc  [amlodipine ] Swelling        Medication List     STOP taking these medications    docusate sodium  100 MG capsule Commonly known as: COLACE   ketoconazole 2 % cream Commonly known as: NIZORAL   polyethylene glycol 17 g packet Commonly known as: MIRALAX  / GLYCOLAX    senna 8.6 MG Tabs tablet Commonly known as: SENOKOT       TAKE these medications    allopurinol  100 MG tablet Commonly known as: ZYLOPRIM  Take 100 mg by mouth in the morning.   apixaban  2.5 MG Tabs tablet Commonly known as: ELIQUIS  Take 1 tablet (2.5 mg total) by mouth 2 (two) times daily. TAKE 1 TABLET(5 MG) BY MOUTH TWICE DAILY   ezetimibe  10 MG tablet Commonly known as: ZETIA  Take 10 mg by mouth in the morning.   ferrous sulfate 325 (65 FE) MG tablet Take 325 mg by mouth daily with breakfast.   Fish Oil 1000 MG Caps  Take 1,000 mg  by mouth 2 (two) times daily.   FLUoxetine  10 MG capsule Commonly known as: PROZAC  Take 10 mg by mouth every evening.   furosemide  40 MG tablet Commonly known as: LASIX  Take 40 mg by mouth in the morning.   guaiFENesin  600 MG 12 hr tablet Commonly known as: MUCINEX  Take 600 mg by mouth 2 (two) times daily as needed for cough or to loosen phlegm.   insulin  aspart 100 UNIT/ML injection Commonly known as: novoLOG  Inject 0-10 Units into the skin 3 (three) times daily before meals. Sliding scale: if sugar is 150-200 = 2 units, if 201-250 = 4 units, if 251-300 = 6 units, if 301-350 = 8 units, if 351-400 = 10 units, give blood sugar is greater than 400 then call the MD.   metFORMIN  1000 MG tablet Commonly known as: GLUCOPHAGE  Take 1,000 mg by mouth 2 (two) times daily with a meal.   metoprolol  succinate 100 MG 24 hr tablet Commonly known as: TOPROL -XL Take 1 tablet (100 mg total) by mouth 2 (two) times daily. Take with or immediately following a meal.   Multivitamin Men 50+ Tabs Take 1 tablet by mouth every evening.   omeprazole  20 MG capsule Commonly known as: PRILOSEC Take 1 capsule (20 mg total) by mouth daily. What changed: when to take this   potassium chloride  SA 20 MEQ tablet Commonly known as: KLOR-CON  M Please take 40 mEq daily for 5 days and then 20 mEq daily What changed:  medication strength how much to take how to take this when to take this additional instructions   pregabalin  50 MG capsule Commonly known as: LYRICA  Take 1 capsule (50 mg total) by mouth 2 (two) times daily.   Soliqua 100-33 UNT-MCG/ML Sopn Generic drug: Insulin  Glargine-Lixisenatide Inject 22 Units into the skin in the morning.   traZODone  50 MG tablet Commonly known as: DESYREL  Take 25 mg by mouth at bedtime.   triamcinolone  0.1 % cream : eucerin Crea Apply on the rash 3 times a day for 5 days.   vancomycin  125 MG capsule Commonly known as: VANCOCIN  Take 1 capsule (125 mg total) by  mouth 4 (four) times daily for 14 days.   Vitamin D 50 MCG (2000 UT) Caps Take 2,000 Units by mouth in the morning.          Follow-up Information     Loreli Kins, MD. Schedule an appointment as soon as possible for a visit in 1 week(s).   Specialty: Family Medicine Why: post hospitalization follow up Contact information: [address] [place] [address] [phone]                 TOTAL DISCHARGE TIME: 35 minutes  Jeanclaude Wentworth Verdene  [facility] Pager on [email or web address]  11/17/2023, 8:49 AM

## 2023-11-17 NOTE — Care Management Important Message (Signed)
 Important Message  Patient Details  Name: Steve Jensen MRN: [id number] Date of Birth: [birth year]   Important Message Given:  Yes - Medicare IM     Vonzell Arrie Sharps 11/17/2023, 10:25 AM

## 2023-11-17 NOTE — TOC Transition Note (Addendum)
 Transition of Care [facility]) - Discharge Note   Patient Details  Name: Steve Jensen MRN: [id number] Date of Birth: 1940-10-27  [facility] CM/SW Contact:  Lauraine FORBES Saa, LCSW Phone Number: 11/17/2023, 12:48 PM   Clinical Narrative:     Patient will DC to: [facility] SNF Anticipated DC date: 11/17/2023 Family notified: Marton Malizia; Daughter; [phone] Transport by: [facility]   Per MD patient ready for DC to [facility]. RN to call report prior to discharge [phone]). RN, patient's family, and facility notified of DC (patient is not currently fully oriented). Discharge Summary and FL2 sent to facility. DC packet on chart. Ambulance transport requested for patient at 12:46.  CSW will sign off for now as social work intervention is no longer needed. Please consult us  again if new needs arise.    Final next level of care: Skilled Nursing Facility Barriers to Discharge: Barriers Resolved   Patient Goals and CMS Choice     Choice offered to / list presented to : Adult Children Renie daughter)      Discharge Placement              Patient chooses bed at: [facility] Patient to be transferred to facility by: [facility] Name of family member notified: Dannie Hattabaugh; Daughter; [phone] Patient and family notified of of transfer: 11/17/23  Discharge Plan and Services Additional resources added to the After Visit Summary for   In-house Referral: Clinical Social Work                                   Social Drivers of Health (SDOH) Interventions SDOH Screenings   Food Insecurity: No Food Insecurity (11/10/2023)  Housing: Low Risk  (11/10/2023)  Transportation Needs: No Transportation Needs (11/10/2023)  Utilities: Not At Risk (11/10/2023)  Social Connections: Socially Isolated (11/10/2023)  Tobacco Use: Medium Risk (11/10/2023)     Readmission Risk Interventions    12/26/2021   12:51 PM 12/25/2021    3:31 PM  12/19/2021   12:39 PM  Readmission Risk Prevention Plan  Transportation Screening  Complete Complete  PCP or Specialist Appt within 5-7 Days Complete    Home Care Screening  Complete   Medication Review (RN CM)  Complete   HRI or Home Care Consult   Complete  Social Work Consult for Recovery Care Planning/Counseling   Complete  Palliative Care Screening   Not Applicable  Medication Review Oceanographer)   Complete

## 2023-11-17 NOTE — Progress Notes (Signed)
 AVS in Packet for transport

## 2023-11-19 DIAGNOSIS — I5030 Unspecified diastolic (congestive) heart failure: Secondary | ICD-10-CM | POA: Diagnosis not present

## 2023-11-19 DIAGNOSIS — I10 Essential (primary) hypertension: Secondary | ICD-10-CM | POA: Diagnosis not present

## 2023-11-19 DIAGNOSIS — E559 Vitamin D deficiency, unspecified: Secondary | ICD-10-CM | POA: Diagnosis not present

## 2023-11-19 DIAGNOSIS — N189 Chronic kidney disease, unspecified: Secondary | ICD-10-CM | POA: Diagnosis not present

## 2023-11-19 DIAGNOSIS — K219 Gastro-esophageal reflux disease without esophagitis: Secondary | ICD-10-CM | POA: Diagnosis not present

## 2023-11-19 DIAGNOSIS — I4891 Unspecified atrial fibrillation: Secondary | ICD-10-CM | POA: Diagnosis not present

## 2023-11-19 DIAGNOSIS — E119 Type 2 diabetes mellitus without complications: Secondary | ICD-10-CM | POA: Diagnosis not present

## 2023-11-20 DIAGNOSIS — I5030 Unspecified diastolic (congestive) heart failure: Secondary | ICD-10-CM | POA: Diagnosis not present

## 2023-11-20 DIAGNOSIS — E876 Hypokalemia: Secondary | ICD-10-CM | POA: Diagnosis not present

## 2023-11-23 DIAGNOSIS — C220 Liver cell carcinoma: Secondary | ICD-10-CM | POA: Diagnosis not present

## 2023-11-23 DIAGNOSIS — R5381 Other malaise: Secondary | ICD-10-CM | POA: Diagnosis not present

## 2023-11-23 DIAGNOSIS — G629 Polyneuropathy, unspecified: Secondary | ICD-10-CM | POA: Diagnosis not present

## 2023-11-23 DIAGNOSIS — K219 Gastro-esophageal reflux disease without esophagitis: Secondary | ICD-10-CM | POA: Diagnosis not present

## 2023-11-23 DIAGNOSIS — S32040D Wedge compression fracture of fourth lumbar vertebra, subsequent encounter for fracture with routine healing: Secondary | ICD-10-CM | POA: Diagnosis not present

## 2023-11-23 DIAGNOSIS — E559 Vitamin D deficiency, unspecified: Secondary | ICD-10-CM | POA: Diagnosis not present

## 2023-11-23 DIAGNOSIS — E785 Hyperlipidemia, unspecified: Secondary | ICD-10-CM | POA: Diagnosis not present

## 2023-11-23 DIAGNOSIS — G4700 Insomnia, unspecified: Secondary | ICD-10-CM | POA: Diagnosis not present

## 2023-11-23 DIAGNOSIS — I5030 Unspecified diastolic (congestive) heart failure: Secondary | ICD-10-CM | POA: Diagnosis not present

## 2023-11-23 DIAGNOSIS — D649 Anemia, unspecified: Secondary | ICD-10-CM | POA: Diagnosis not present

## 2023-11-23 DIAGNOSIS — E876 Hypokalemia: Secondary | ICD-10-CM | POA: Diagnosis not present

## 2023-11-24 DIAGNOSIS — L97521 Non-pressure chronic ulcer of other part of left foot limited to breakdown of skin: Secondary | ICD-10-CM | POA: Diagnosis not present

## 2023-11-30 DIAGNOSIS — R5381 Other malaise: Secondary | ICD-10-CM | POA: Diagnosis not present

## 2023-11-30 DIAGNOSIS — I4891 Unspecified atrial fibrillation: Secondary | ICD-10-CM | POA: Diagnosis not present

## 2023-11-30 DIAGNOSIS — G4700 Insomnia, unspecified: Secondary | ICD-10-CM | POA: Diagnosis not present

## 2023-11-30 DIAGNOSIS — N189 Chronic kidney disease, unspecified: Secondary | ICD-10-CM | POA: Diagnosis not present

## 2023-11-30 DIAGNOSIS — S32040D Wedge compression fracture of fourth lumbar vertebra, subsequent encounter for fracture with routine healing: Secondary | ICD-10-CM | POA: Diagnosis not present

## 2023-11-30 DIAGNOSIS — E876 Hypokalemia: Secondary | ICD-10-CM | POA: Diagnosis not present

## 2023-11-30 DIAGNOSIS — G4733 Obstructive sleep apnea (adult) (pediatric): Secondary | ICD-10-CM | POA: Diagnosis not present

## 2023-11-30 DIAGNOSIS — E119 Type 2 diabetes mellitus without complications: Secondary | ICD-10-CM | POA: Diagnosis not present

## 2023-11-30 DIAGNOSIS — D649 Anemia, unspecified: Secondary | ICD-10-CM | POA: Diagnosis not present

## 2023-11-30 DIAGNOSIS — I10 Essential (primary) hypertension: Secondary | ICD-10-CM | POA: Diagnosis not present

## 2023-11-30 DIAGNOSIS — I5030 Unspecified diastolic (congestive) heart failure: Secondary | ICD-10-CM | POA: Diagnosis not present

## 2023-12-01 DIAGNOSIS — L97521 Non-pressure chronic ulcer of other part of left foot limited to breakdown of skin: Secondary | ICD-10-CM | POA: Diagnosis not present

## 2023-12-02 DIAGNOSIS — G4700 Insomnia, unspecified: Secondary | ICD-10-CM | POA: Diagnosis not present

## 2023-12-02 DIAGNOSIS — I5030 Unspecified diastolic (congestive) heart failure: Secondary | ICD-10-CM | POA: Diagnosis not present

## 2023-12-02 DIAGNOSIS — K219 Gastro-esophageal reflux disease without esophagitis: Secondary | ICD-10-CM | POA: Diagnosis not present

## 2023-12-02 DIAGNOSIS — E785 Hyperlipidemia, unspecified: Secondary | ICD-10-CM | POA: Diagnosis not present

## 2023-12-02 DIAGNOSIS — E876 Hypokalemia: Secondary | ICD-10-CM | POA: Diagnosis not present

## 2023-12-02 DIAGNOSIS — E119 Type 2 diabetes mellitus without complications: Secondary | ICD-10-CM | POA: Diagnosis not present

## 2023-12-02 DIAGNOSIS — S32040D Wedge compression fracture of fourth lumbar vertebra, subsequent encounter for fracture with routine healing: Secondary | ICD-10-CM | POA: Diagnosis not present

## 2023-12-02 DIAGNOSIS — I10 Essential (primary) hypertension: Secondary | ICD-10-CM | POA: Diagnosis not present

## 2023-12-02 DIAGNOSIS — M109 Gout, unspecified: Secondary | ICD-10-CM | POA: Diagnosis not present

## 2023-12-02 DIAGNOSIS — D649 Anemia, unspecified: Secondary | ICD-10-CM | POA: Diagnosis not present

## 2023-12-02 DIAGNOSIS — G4733 Obstructive sleep apnea (adult) (pediatric): Secondary | ICD-10-CM | POA: Diagnosis not present

## 2023-12-07 DIAGNOSIS — I10 Essential (primary) hypertension: Secondary | ICD-10-CM | POA: Diagnosis not present

## 2023-12-08 DIAGNOSIS — L97521 Non-pressure chronic ulcer of other part of left foot limited to breakdown of skin: Secondary | ICD-10-CM | POA: Diagnosis not present

## 2023-12-09 ENCOUNTER — Inpatient Hospital Stay (HOSPITAL_COMMUNITY)
Admission: EM | Admit: 2023-12-09 | Discharge: 2023-12-18 | DRG: 682 | Disposition: A | Discharge status: Skilled Nursing Facility | Source: Skilled Nursing Facility | Attending: Internal Medicine | Admitting: Internal Medicine

## 2023-12-09 ENCOUNTER — Emergency Department (HOSPITAL_COMMUNITY)

## 2023-12-09 ENCOUNTER — Encounter (HOSPITAL_COMMUNITY): Payer: Self-pay

## 2023-12-09 DIAGNOSIS — K219 Gastro-esophageal reflux disease without esophagitis: Secondary | ICD-10-CM | POA: Diagnosis present

## 2023-12-09 DIAGNOSIS — I517 Cardiomegaly: Secondary | ICD-10-CM | POA: Diagnosis not present

## 2023-12-09 DIAGNOSIS — E860 Dehydration: Secondary | ICD-10-CM | POA: Diagnosis not present

## 2023-12-09 DIAGNOSIS — G9341 Metabolic encephalopathy: Secondary | ICD-10-CM | POA: Diagnosis not present

## 2023-12-09 DIAGNOSIS — Z6841 Body Mass Index (BMI) 40.0 and over, adult: Secondary | ICD-10-CM

## 2023-12-09 DIAGNOSIS — E876 Hypokalemia: Secondary | ICD-10-CM | POA: Diagnosis not present

## 2023-12-09 DIAGNOSIS — Z833 Family history of diabetes mellitus: Secondary | ICD-10-CM

## 2023-12-09 DIAGNOSIS — Z8505 Personal history of malignant neoplasm of liver: Secondary | ICD-10-CM

## 2023-12-09 DIAGNOSIS — Z794 Long term (current) use of insulin: Secondary | ICD-10-CM | POA: Diagnosis not present

## 2023-12-09 DIAGNOSIS — E785 Hyperlipidemia, unspecified: Secondary | ICD-10-CM | POA: Diagnosis not present

## 2023-12-09 DIAGNOSIS — I480 Paroxysmal atrial fibrillation: Secondary | ICD-10-CM | POA: Diagnosis present

## 2023-12-09 DIAGNOSIS — E119 Type 2 diabetes mellitus without complications: Secondary | ICD-10-CM | POA: Diagnosis not present

## 2023-12-09 DIAGNOSIS — G4700 Insomnia, unspecified: Secondary | ICD-10-CM | POA: Diagnosis present

## 2023-12-09 DIAGNOSIS — G4733 Obstructive sleep apnea (adult) (pediatric): Secondary | ICD-10-CM | POA: Diagnosis not present

## 2023-12-09 DIAGNOSIS — I4891 Unspecified atrial fibrillation: Secondary | ICD-10-CM | POA: Diagnosis not present

## 2023-12-09 DIAGNOSIS — Z66 Do not resuscitate: Secondary | ICD-10-CM | POA: Diagnosis present

## 2023-12-09 DIAGNOSIS — F39 Unspecified mood [affective] disorder: Secondary | ICD-10-CM | POA: Diagnosis present

## 2023-12-09 DIAGNOSIS — M109 Gout, unspecified: Secondary | ICD-10-CM | POA: Diagnosis present

## 2023-12-09 DIAGNOSIS — D649 Anemia, unspecified: Secondary | ICD-10-CM | POA: Diagnosis not present

## 2023-12-09 DIAGNOSIS — I130 Hypertensive heart and chronic kidney disease with heart failure and stage 1 through stage 4 chronic kidney disease, or unspecified chronic kidney disease: Secondary | ICD-10-CM | POA: Diagnosis not present

## 2023-12-09 DIAGNOSIS — I509 Heart failure, unspecified: Secondary | ICD-10-CM

## 2023-12-09 DIAGNOSIS — N1831 Chronic kidney disease, stage 3a: Secondary | ICD-10-CM | POA: Diagnosis present

## 2023-12-09 DIAGNOSIS — E66813 Obesity, class 3: Secondary | ICD-10-CM | POA: Diagnosis present

## 2023-12-09 DIAGNOSIS — R9082 White matter disease, unspecified: Secondary | ICD-10-CM | POA: Diagnosis not present

## 2023-12-09 DIAGNOSIS — E1140 Type 2 diabetes mellitus with diabetic neuropathy, unspecified: Secondary | ICD-10-CM | POA: Diagnosis not present

## 2023-12-09 DIAGNOSIS — I5033 Acute on chronic diastolic (congestive) heart failure: Secondary | ICD-10-CM | POA: Diagnosis not present

## 2023-12-09 DIAGNOSIS — E1142 Type 2 diabetes mellitus with diabetic polyneuropathy: Secondary | ICD-10-CM

## 2023-12-09 DIAGNOSIS — Z8619 Personal history of other infectious and parasitic diseases: Secondary | ICD-10-CM

## 2023-12-09 DIAGNOSIS — E8721 Acute metabolic acidosis: Secondary | ICD-10-CM | POA: Diagnosis present

## 2023-12-09 DIAGNOSIS — R0989 Other specified symptoms and signs involving the circulatory and respiratory systems: Secondary | ICD-10-CM | POA: Diagnosis not present

## 2023-12-09 DIAGNOSIS — N3001 Acute cystitis with hematuria: Secondary | ICD-10-CM | POA: Diagnosis not present

## 2023-12-09 DIAGNOSIS — C220 Liver cell carcinoma: Secondary | ICD-10-CM | POA: Diagnosis present

## 2023-12-09 DIAGNOSIS — A0472 Enterocolitis due to Clostridium difficile, not specified as recurrent: Secondary | ICD-10-CM | POA: Diagnosis not present

## 2023-12-09 DIAGNOSIS — I5030 Unspecified diastolic (congestive) heart failure: Secondary | ICD-10-CM | POA: Diagnosis not present

## 2023-12-09 DIAGNOSIS — Z888 Allergy status to other drugs, medicaments and biological substances status: Secondary | ICD-10-CM

## 2023-12-09 DIAGNOSIS — T501X5A Adverse effect of loop [high-ceiling] diuretics, initial encounter: Secondary | ICD-10-CM | POA: Diagnosis not present

## 2023-12-09 DIAGNOSIS — Z7984 Long term (current) use of oral hypoglycemic drugs: Secondary | ICD-10-CM

## 2023-12-09 DIAGNOSIS — E1122 Type 2 diabetes mellitus with diabetic chronic kidney disease: Secondary | ICD-10-CM | POA: Diagnosis not present

## 2023-12-09 DIAGNOSIS — Z8042 Family history of malignant neoplasm of prostate: Secondary | ICD-10-CM

## 2023-12-09 DIAGNOSIS — R4182 Altered mental status, unspecified: Secondary | ICD-10-CM | POA: Diagnosis not present

## 2023-12-09 DIAGNOSIS — I428 Other cardiomyopathies: Secondary | ICD-10-CM | POA: Diagnosis not present

## 2023-12-09 DIAGNOSIS — N390 Urinary tract infection, site not specified: Secondary | ICD-10-CM | POA: Diagnosis not present

## 2023-12-09 DIAGNOSIS — I482 Chronic atrial fibrillation, unspecified: Secondary | ICD-10-CM | POA: Diagnosis present

## 2023-12-09 DIAGNOSIS — S32040D Wedge compression fracture of fourth lumbar vertebra, subsequent encounter for fracture with routine healing: Secondary | ICD-10-CM | POA: Diagnosis not present

## 2023-12-09 DIAGNOSIS — R5381 Other malaise: Secondary | ICD-10-CM | POA: Diagnosis not present

## 2023-12-09 DIAGNOSIS — Z881 Allergy status to other antibiotic agents status: Secondary | ICD-10-CM

## 2023-12-09 DIAGNOSIS — K8020 Calculus of gallbladder without cholecystitis without obstruction: Secondary | ICD-10-CM | POA: Diagnosis not present

## 2023-12-09 DIAGNOSIS — Z7901 Long term (current) use of anticoagulants: Secondary | ICD-10-CM

## 2023-12-09 DIAGNOSIS — I499 Cardiac arrhythmia, unspecified: Secondary | ICD-10-CM | POA: Diagnosis not present

## 2023-12-09 DIAGNOSIS — Z8249 Family history of ischemic heart disease and other diseases of the circulatory system: Secondary | ICD-10-CM

## 2023-12-09 DIAGNOSIS — G8929 Other chronic pain: Secondary | ICD-10-CM | POA: Diagnosis present

## 2023-12-09 DIAGNOSIS — I10 Essential (primary) hypertension: Secondary | ICD-10-CM | POA: Diagnosis not present

## 2023-12-09 DIAGNOSIS — N179 Acute kidney failure, unspecified: Principal | ICD-10-CM | POA: Diagnosis present

## 2023-12-09 DIAGNOSIS — R29810 Facial weakness: Secondary | ICD-10-CM | POA: Diagnosis not present

## 2023-12-09 DIAGNOSIS — Z9079 Acquired absence of other genital organ(s): Secondary | ICD-10-CM

## 2023-12-09 DIAGNOSIS — K449 Diaphragmatic hernia without obstruction or gangrene: Secondary | ICD-10-CM | POA: Diagnosis not present

## 2023-12-09 DIAGNOSIS — N200 Calculus of kidney: Secondary | ICD-10-CM | POA: Diagnosis present

## 2023-12-09 DIAGNOSIS — Z8546 Personal history of malignant neoplasm of prostate: Secondary | ICD-10-CM

## 2023-12-09 DIAGNOSIS — N189 Chronic kidney disease, unspecified: Secondary | ICD-10-CM | POA: Diagnosis not present

## 2023-12-09 DIAGNOSIS — Z87891 Personal history of nicotine dependence: Secondary | ICD-10-CM

## 2023-12-09 DIAGNOSIS — Z79899 Other long term (current) drug therapy: Secondary | ICD-10-CM

## 2023-12-09 DIAGNOSIS — R6889 Other general symptoms and signs: Secondary | ICD-10-CM | POA: Diagnosis not present

## 2023-12-09 LAB — CBC WITH DIFFERENTIAL/PLATELET
Abs Immature Granulocytes: 0.06 K/uL (ref 0.00–0.07)
Basophils Absolute: 0.1 K/uL (ref 0.0–0.1)
Basophils Relative: 0 %
Eosinophils Absolute: 0.3 K/uL (ref 0.0–0.5)
Eosinophils Relative: 2 %
HCT: 40.5 % (ref 39.0–52.0)
Hemoglobin: 12.9 g/dL — ABNORMAL LOW (ref 13.0–17.0)
Immature Granulocytes: 1 %
Lymphocytes Relative: 20 %
Lymphs Abs: 2.3 K/uL (ref 0.7–4.0)
MCH: 27.8 pg (ref 26.0–34.0)
MCHC: 31.9 g/dL (ref 30.0–36.0)
MCV: 87.3 fL (ref 80.0–100.0)
Monocytes Absolute: 1.2 K/uL — ABNORMAL HIGH (ref 0.1–1.0)
Monocytes Relative: 10 %
Neutro Abs: 7.7 K/uL (ref 1.7–7.7)
Neutrophils Relative %: 67 %
Platelets: 436 K/uL — ABNORMAL HIGH (ref 150–400)
RBC: 4.64 MIL/uL (ref 4.22–5.81)
RDW: 18.1 % — ABNORMAL HIGH (ref 11.5–15.5)
WBC: 11.5 K/uL — ABNORMAL HIGH (ref 4.0–10.5)
nRBC: 0 % (ref 0.0–0.2)

## 2023-12-09 LAB — URINALYSIS, W/ REFLEX TO CULTURE (INFECTION SUSPECTED)
Bilirubin Urine: NEGATIVE
Glucose, UA: NEGATIVE mg/dL
Ketones, ur: NEGATIVE mg/dL
Nitrite: NEGATIVE
Protein, ur: 30 mg/dL — AB
Specific Gravity, Urine: 1.009 (ref 1.005–1.030)
WBC, UA: >50 WBC/hpf (ref 0–5)
pH: 5 (ref 5.0–8.0)

## 2023-12-09 LAB — I-STAT VENOUS BLOOD GAS, ED
Acid-base deficit: 8 mmol/L — ABNORMAL HIGH (ref 0.0–2.0)
Bicarbonate: 18.1 mmol/L — ABNORMAL LOW (ref 20.0–28.0)
Calcium, Ion: 1.17 mmol/L (ref 1.15–1.40)
HCT: 39 % (ref 39.0–52.0)
Hemoglobin: 13.3 g/dL (ref 13.0–17.0)
O2 Saturation: 58 %
Potassium: 4.2 mmol/L (ref 3.5–5.1)
Sodium: 137 mmol/L (ref 135–145)
TCO2: 19 mmol/L — ABNORMAL LOW (ref 22–32)
pCO2, Ven: 36.4 mmHg — ABNORMAL LOW (ref 44–60)
pH, Ven: 7.305 (ref 7.25–7.43)
pO2, Ven: 33 mmHg (ref 32–45)

## 2023-12-09 LAB — COMPREHENSIVE METABOLIC PANEL WITH GFR
ALT: 10 U/L (ref 0–44)
AST: 20 U/L (ref 15–41)
Albumin: 2.8 g/dL — ABNORMAL LOW (ref 3.5–5.0)
Alkaline Phosphatase: 95 U/L (ref 38–126)
Anion gap: 15 (ref 5–15)
BUN: 51 mg/dL — ABNORMAL HIGH (ref 8–23)
CO2: 18 mmol/L — ABNORMAL LOW (ref 22–32)
Calcium: 9.1 mg/dL (ref 8.9–10.3)
Chloride: 104 mmol/L (ref 98–111)
Creatinine, Ser: 6.07 mg/dL — ABNORMAL HIGH (ref 0.61–1.24)
GFR, Estimated: 9 mL/min — ABNORMAL LOW (ref >60)
Glucose, Bld: 83 mg/dL (ref 70–99)
Potassium: 4.2 mmol/L (ref 3.5–5.1)
Sodium: 137 mmol/L (ref 135–145)
Total Bilirubin: 0.6 mg/dL (ref 0.0–1.2)
Total Protein: 5.5 g/dL — ABNORMAL LOW (ref 6.5–8.1)

## 2023-12-09 LAB — I-STAT CG4 LACTIC ACID, ED
Lactic Acid, Venous: 3.9 mmol/L (ref 0.5–1.9)
Lactic Acid, Venous: 2.7 mmol/L (ref 0.5–1.9)

## 2023-12-09 LAB — CBG MONITORING, ED: Glucose-Capillary: 88 mg/dL (ref 70–99)

## 2023-12-09 LAB — AMMONIA: Ammonia: 14 umol/L (ref 9–35)

## 2023-12-09 LAB — BRAIN NATRIURETIC PEPTIDE: B Natriuretic Peptide: 526.7 pg/mL — ABNORMAL HIGH (ref 0.0–100.0)

## 2023-12-09 MED ORDER — SODIUM CHLORIDE 0.9 % IV SOLN
2.0000 g | Freq: Once | INTRAVENOUS | Status: AC
  Administered 2023-12-09: 2 g via INTRAVENOUS
  Filled 2023-12-09: qty 20

## 2023-12-09 MED ORDER — SODIUM CHLORIDE 0.9 % IV SOLN: INTRAVENOUS | Status: DC

## 2023-12-09 MED ORDER — SODIUM CHLORIDE 0.9 % IV BOLUS
1000.0000 mL | Freq: Once | INTRAVENOUS | Status: AC
  Administered 2023-12-09: 1000 mL via INTRAVENOUS

## 2023-12-09 MED ORDER — LORAZEPAM 1 MG PO TABS: 0.5000 mg | ORAL_TABLET | ORAL | Status: DC | PRN

## 2023-12-09 NOTE — ED Triage Notes (Signed)
 Pt bib EMS for AMS from creatinine 6, low HBG, from SNF ([facility]). Hx C-diff (unknown diagnosis date or treatment). Baseline A+Ox4 at baseline per EMS. GCS 13. Pt not answering orientation questions at this time.   CBG 98, 98.2F, 107/70, 80 (a-fib), 99% RA. 20 R forearm. 200 mL NS PTA.

## 2023-12-09 NOTE — ED Notes (Signed)
 Pt had very large loose bowel movement, peri care completed, bedding and brief changed.

## 2023-12-09 NOTE — ED Notes (Signed)
 Patient now answering orientation questions. Oriented to self and place.

## 2023-12-09 NOTE — ED Provider Notes (Addendum)
 [place] EMERGENCY DEPARTMENT AT [facility] Provider Note   CSN: [id number] Arrival date & time: 12/09/23  1542     Patient presents with: Altered Mental Status and Diarrhea   Steve Jensen is a 83 y.o. male.   HPI   83 year old male with medical history significant for HTN, DM2, OSA, morbid obesity, paroxysmal atrial fibrillation, chronic diastolic heart failure, hepatocellular carcinoma, CKD, C. difficile currently on oral vancomycin  who presents to the emergency department with concern for confusion and elevated renal function.  The patient had a creatinine at his skilled nursing facility of over 6.  He has been acutely confused, woke up this morning confused with unclear last normal, disoriented to year.  On arrival, the patient was GCS 14, AAO x 2, denied any pain or complaints.  Prior to Admission medications   Medication Sig Start Date End Date Taking? Authorizing Provider  allopurinol  (ZYLOPRIM ) 100 MG tablet Take 100 mg by mouth in the morning. 04/17/21  Yes [provider]  apixaban  (ELIQUIS ) 5 MG TABS tablet Take 5 mg by mouth 2 (two) times daily.   Yes [provider]  Cholecalciferol (VITAMIN D) 50 MCG (2000 UT) CAPS Take 2,000 Units by mouth in the morning.   Yes [provider]  ezetimibe  (ZETIA ) 10 MG tablet Take 10 mg by mouth in the morning.   Yes [provider]  ferrous sulfate 325 (65 FE) MG tablet Take 325 mg by mouth daily with breakfast.   Yes [provider]  FLUoxetine  (PROZAC ) 10 MG capsule Take 10 mg by mouth every evening. 04/14/23  Yes [provider]  furosemide  (LASIX ) 40 MG tablet Take 40 mg by mouth in the morning.   Yes [provider]  guaiFENesin  (MUCINEX ) 600 MG 12 hr tablet Take 600 mg by mouth 2 (two) times daily as needed for cough or to loosen phlegm.   Yes [provider]  insulin  aspart (NOVOLOG ) 100 UNIT/ML injection Inject 0-10 Units into the skin 3 (three)  times daily before meals. Sliding scale: if sugar is 150-200 = 2 units, if 201-250 = 4 units, if 251-300 = 6 units, if 301-350 = 8 units, if 351-400 = 10 units, give blood sugar is greater than 400 then call the MD.   Yes [provider]  Insulin  Glargine-Lixisenatide (SOLIQUA) 100-33 UNT-MCG/ML SOPN Inject 22 Units into the skin in the morning.   Yes [provider]  Magnesium  Glycinate 100 MG CAPS Take 1 capsule by mouth in the morning and at bedtime.   Yes [provider]  metFORMIN  (GLUCOPHAGE ) 1000 MG tablet Take 1,000 mg by mouth 2 (two) times daily with a meal. 06/27/14  Yes [provider]  metoprolol  succinate (TOPROL -XL) 100 MG 24 hr tablet Take 1 tablet (100 mg total) by mouth 2 (two) times daily. Take with or immediately following a meal. 10/30/23  Yes Pokhrel, Laxman, MD  Multiple Vitamins-Minerals (MULTIVITAMIN MEN 50+) TABS Take 1 tablet by mouth every evening.   Yes [provider]  Omega-3 Fatty Acids (FISH OIL) 1000 MG CAPS Take 1,000 mg by mouth 2 (two) times daily.   Yes [provider]  omeprazole  (PRILOSEC) 20 MG capsule Take 1 capsule (20 mg total) by mouth daily. Patient taking differently: Take 20 mg by mouth in the morning. 11/15/22  Yes Johnson, Clanford L, MD  potassium chloride  (KLOR-CON  M) 20 MEQ tablet Please take 40 mEq daily for 5 days and then 20 mEq daily Patient taking  differently: Take 40 mEq by mouth daily. 11/16/23  Yes Krishnan, Gokul, MD  pregabalin  (LYRICA ) 50 MG capsule Take 1 capsule (50 mg total) by mouth 2 (two) times daily. 11/16/23 11/15/24 Yes Krishnan, Gokul, MD  traZODone  (DESYREL ) 50 MG tablet Take 25 mg by mouth at bedtime. 11/04/23  Yes [provider]  zinc  oxide 20 % ointment Apply 1 Application topically See admin instructions. Every shift   Yes [provider]    Allergies: Lipitor [atorvastatin], Zithromax [azithromycin], Zocor [simvastatin], Dificid  [fidaxomicin ], and Norvasc   [amlodipine ]    Review of Systems  All other systems reviewed and are negative.   Updated Vital Signs BP 100/60   Pulse 84   Temp (!) 96.8 F (36 C) (Rectal)   Resp 14   SpO2 98%   Physical Exam Vitals and nursing note reviewed.  Constitutional:      General: He is not in acute distress.    Appearance: He is well-developed.  HENT:     Head: Normocephalic and atraumatic.  Eyes:     Conjunctiva/sclera: Conjunctivae normal.  Cardiovascular:     Rate and Rhythm: Normal rate and regular rhythm.  Pulmonary:     Effort: Pulmonary effort is normal. No respiratory distress.     Breath sounds: Normal breath sounds.  Abdominal:     General: There is distension.     Palpations: Abdomen is soft.     Tenderness: There is no abdominal tenderness.  Musculoskeletal:        General: No swelling.     Cervical back: Neck supple.  Skin:    General: Skin is warm and dry.     Capillary Refill: Capillary refill takes less than 2 seconds.  Neurological:     Mental Status: He is alert.     Comments: GCS 14, AAO x 2, will whisper answers to questions and follow commands, seems slow to follow commands, no focal cranial nerve deficit, intact strength in the bilateral upper extremities, will not follow commands to evaluate the bilateral lower extremity  Psychiatric:        Mood and Affect: Mood normal.     (all labs ordered are listed, but only abnormal results are displayed) Labs Reviewed  COMPREHENSIVE METABOLIC PANEL WITH GFR - Abnormal; Notable for the following components:      Result Value   CO2 18 (*)    BUN 51 (*)    Creatinine, Ser 6.07 (*)    Total Protein 5.5 (*)    Albumin  2.8 (*)    GFR, Estimated 9 (*)    All other components within normal limits  CBC WITH DIFFERENTIAL/PLATELET - Abnormal; Notable for the following components:   WBC 11.5 (*)    Hemoglobin 12.9 (*)    RDW 18.1 (*)    Platelets 436 (*)    Monocytes Absolute 1.2 (*)    All other components within normal  limits  URINALYSIS, W/ REFLEX TO CULTURE (INFECTION SUSPECTED) - Abnormal; Notable for the following components:   APPearance CLOUDY (*)    Hgb urine dipstick MODERATE (*)    Protein, ur 30 (*)    Leukocytes,Ua LARGE (*)    Bacteria, UA FEW (*)    All other components within normal limits  I-STAT CG4 LACTIC ACID, ED - Abnormal; Notable for the following components:   Lactic Acid, Venous 3.9 (*)    All other components within normal limits  I-STAT VENOUS BLOOD GAS, ED - Abnormal; Notable for the following components:   pCO2,  Ven 36.4 (*)    Bicarbonate 18.1 (*)    TCO2 19 (*)    Acid-base deficit 8.0 (*)    All other components within normal limits  I-STAT CG4 LACTIC ACID, ED - Abnormal; Notable for the following components:   Lactic Acid, Venous 2.7 (*)    All other components within normal limits  CULTURE, BLOOD (ROUTINE X 2)  CULTURE, BLOOD (ROUTINE X 2)  URINE CULTURE  AMMONIA  BRAIN NATRIURETIC PEPTIDE  CBG MONITORING, ED  CBG MONITORING, ED    EKG: EKG Interpretation Date/Time:  Wednesday December 09 2023 16:00:31 EDT Ventricular Rate:  73 PR Interval:    QRS Duration:  106 QT Interval:  397 QTC Calculation: 438 R Axis:   104  Text Interpretation: Atrial fibrillation Right axis deviation Borderline T abnormalities, inferior leads Confirmed by Jerrol Agent ([id number]) on 12/09/2023 4:01:33 PM  Radiology: [facility] Chest Port 1 View Result Date: 12/09/2023 CLINICAL DATA:  Altered mental status EXAM: PORTABLE CHEST 1 VIEW COMPARISON:  Chest x-ray 11/09/2023 FINDINGS: The heart is mildly enlarged. There is central pulmonary vascular congestion. There is atelectasis or scarring in the left mid lung. There is no pleural effusion or pneumothorax. No acute fractures are seen. IMPRESSION: Mild cardiomegaly with central pulmonary vascular congestion. Electronically Signed   By: Greig Pique M.D.   On: 12/09/2023 18:00   CT Renal Stone Study Result Date: 12/09/2023 CLINICAL DATA:  Abdominal  and flank pain, stone suspected history of prostate cancer, history of hepatocellular carcinoma * Tracking Code: BO * EXAM: CT ABDOMEN AND PELVIS WITHOUT CONTRAST TECHNIQUE: Multidetector CT imaging of the abdomen and pelvis was performed following the standard protocol without IV contrast. RADIATION DOSE REDUCTION: This exam was performed according to the departmental dose-optimization program which includes automated exposure control, adjustment of the mA and/or kV according to patient size and/or use of iterative reconstruction technique. COMPARISON:  11/11/2023 FINDINGS: Lower chest: Cardiomegaly. Unchanged atelectasis or consolidation of the right lung base. Unchanged trace right pleural effusion. Small hiatal hernia. Hepatobiliary: Coarse cirrhotic morphology of the liver. Patient's known hepatocellular carcinoma not well appreciated by noncontrast CT. Tiny gallstones. Gallbladder wall thickening, or biliary dilatation. Pancreas: Unremarkable. No pancreatic ductal dilatation or surrounding inflammatory changes. Spleen: Normal in size without significant abnormality. Adrenals/Urinary Tract: Unchanged benign right adrenal adenoma, requiring no further follow-up or characterization. Kidneys are normal, without renal calculi, solid lesion, or hydronephrosis. Bladder is unremarkable. Stomach/Bowel: Stomach is within normal limits. Appendix appears normal. Colon is fluid-filled to the rectum. Vascular/Lymphatic: Aortic atherosclerosis. No enlarged abdominal or pelvic lymph nodes. Reproductive: Status post prostatectomy. Other: No abdominal wall hernia or abnormality. No ascites. Musculoskeletal: Redemonstrated superior endplate fracture deformity of the L4 vertebral body with substantial lytic component (series 7, image 155). Unchanged sclerotic superior endplate deformity of L1. Extensive bridging osteophytosis of the thoracic and upper lumbar spine in keeping with DISH. IMPRESSION: 1. No noncontrast CT evidence of  urinary tract calculus or hydronephrosis. 2. Redemonstrated superior endplate fracture deformity of the L4 vertebral body with substantial lytic component. This may reflect benign fracture resorption but is highly suspicious for underlying metastasis in this patient with known malignancy. 3. Colon is fluid-filled to the rectum, consistent with diarrheal illness. 4. Coarse cirrhotic morphology of the liver. Patient's known hepatocellular carcinoma not well appreciated by noncontrast CT. 5. Cholelithiasis. 6. Status post prostatectomy. Aortic Atherosclerosis (ICD10-I70.0). Electronically Signed   By: Marolyn JONETTA Jaksch M.D.   On: 12/09/2023 17:07   CT HEAD WO CONTRAST Result Date: 12/09/2023  CLINICAL DATA:  Altered mental status EXAM: CT HEAD WITHOUT CONTRAST TECHNIQUE: Contiguous axial images were obtained from the base of the skull through the vertex without intravenous contrast. RADIATION DOSE REDUCTION: This exam was performed according to the departmental dose-optimization program which includes automated exposure control, adjustment of the mA and/or kV according to patient size and/or use of iterative reconstruction technique. COMPARISON:  10/24/2023 FINDINGS: Brain: No evidence of acute infarction, hemorrhage, hydrocephalus, extra-axial collection or mass lesion/mass effect. Periventricular white matter hypodensity. Vascular: No hyperdense vessel or unexpected calcification. Skull: Normal. Negative for fracture or focal lesion. Sinuses/Orbits: No acute finding. Other: None. IMPRESSION: No acute intracranial pathology.  Small-vessel white matter disease. Electronically Signed   By: Marolyn JONETTA Jaksch M.D.   On: 12/09/2023 17:00     Procedures   Medications Ordered in the ED  sodium chloride  0.9 % bolus 1,000 mL (0 mLs Intravenous Stopped 12/09/23 1744)    And  0.9 %  sodium chloride  infusion ( Intravenous New Bag/Given 12/09/23 1630)  cefTRIAXone  (ROCEPHIN ) 2 g in sodium chloride  0.9 % 100 mL IVPB (has no  administration in time range)    Clinical Course as of 12/09/23 2250  Wed Dec 09, 2023  2249 Bacteria, UA(!): FEW [JL]  2249 WBC, UA: >50 [JL]  2249 Leukocytes,Ua(!): LARGE [JL]    Clinical Course User Index [JL] Jerrol Agent, MD                                 Medical Decision Making Amount and/or Complexity of Data Reviewed Labs: ordered. Decision-making details documented in ED Course. Radiology: ordered.  Risk Prescription drug management. Decision regarding hospitalization.     83 year old male with medical history significant for HTN, DM2, OSA, morbid obesity, paroxysmal atrial fibrillation, chronic diastolic heart failure, hepatocellular carcinoma, CKD, C. difficile currently on oral vancomycin  who presents to the emergency department with concern for confusion and elevated renal function.  The patient had a creatinine at his skilled nursing facility of over 6.  He has been acutely confused, woke up this morning confused with unclear last normal, disoriented to year.  On arrival, the patient was GCS 15, AAO x 2, denied any pain or complaints.  Medical Decision Making:   Steve Jensen is a 83 y.o. male who presented to the ED today with altered mental status detailed above.     Complete initial physical exam performed, notably the patient  was CTAB, neuro intact, GCS 14.    Reviewed and confirmed nursing documentation for past medical history, family history, social history.    Initial Assessment:   With the patient's presentation of altered mental status, most likely diagnosis is delerium 2/2 infectious etiology (UTI/CAP/URI) vs metabolic abnormality (Na/K/Mg/Ca) vs nonspecific etiology. Other diagnoses were considered including (but not limited to) CVA, ICH, intracranial mass, critical dehydration, heptatic dysfunction, uremia, hypercarbia, intoxication, endrocrine abnormality, toxidrome. These are considered less likely due to history of present illness and physical exam  findings.   This is most consistent with an acute life/limb threatening illness complicated by underlying chronic conditions.  Initial Plan:  CTH to evaluate for intracranial etiology of patient's symptoms  CT Stone study to eval for obstructive etiology of AKI Screening labs including CBC and Metabolic panel to evaluate for infectious or metabolic etiology of disease.  Urinalysis with reflex culture ordered to evaluate for UTI or relevant urologic/nephrologic pathology.  CXR to evaluate for structural/infectious intrathoracic pathology.  TSH  for evaluation for endrocrine etiology Drug screen for toxidrome evaluation VBG for acid/base status and further toxidrome evaulation EKG to evaluate for cardiac pathology Objective evaluation as below reviewed   Initial Study Results:   Laboratory  All laboratory results reviewed without evidence of clinically relevant pathology.   Exceptions include: That and newly evaded at 6.07, BUN 51, bicarb 18, anion gap 15, lactic acid elevated at 2.7, UA pending, blood cultures collected and pending, CBG normal, ammonia normal, VBG with a pH of 7.31, PCO236, HCO3 18, BNP pending.  EKG EKG was reviewed independently. Rate, rhythm, axis, intervals all examined and without medically relevant abnormality. ST segments without concerns for elevations.    Radiology:  All images reviewed independently. Agree with radiology report at this time.   DG Chest Port 1 View Result Date: 12/09/2023 CLINICAL DATA:  Altered mental status EXAM: PORTABLE CHEST 1 VIEW COMPARISON:  Chest x-ray 11/09/2023 FINDINGS: The heart is mildly enlarged. There is central pulmonary vascular congestion. There is atelectasis or scarring in the left mid lung. There is no pleural effusion or pneumothorax. No acute fractures are seen. IMPRESSION: Mild cardiomegaly with central pulmonary vascular congestion. Electronically Signed   By: Greig Pique M.D.   On: 12/09/2023 18:00   CT Renal Stone  Study Result Date: 12/09/2023 CLINICAL DATA:  Abdominal and flank pain, stone suspected history of prostate cancer, history of hepatocellular carcinoma * Tracking Code: BO * EXAM: CT ABDOMEN AND PELVIS WITHOUT CONTRAST TECHNIQUE: Multidetector CT imaging of the abdomen and pelvis was performed following the standard protocol without IV contrast. RADIATION DOSE REDUCTION: This exam was performed according to the departmental dose-optimization program which includes automated exposure control, adjustment of the mA and/or kV according to patient size and/or use of iterative reconstruction technique. COMPARISON:  11/11/2023 FINDINGS: Lower chest: Cardiomegaly. Unchanged atelectasis or consolidation of the right lung base. Unchanged trace right pleural effusion. Small hiatal hernia. Hepatobiliary: Coarse cirrhotic morphology of the liver. Patient's known hepatocellular carcinoma not well appreciated by noncontrast CT. Tiny gallstones. Gallbladder wall thickening, or biliary dilatation. Pancreas: Unremarkable. No pancreatic ductal dilatation or surrounding inflammatory changes. Spleen: Normal in size without significant abnormality. Adrenals/Urinary Tract: Unchanged benign right adrenal adenoma, requiring no further follow-up or characterization. Kidneys are normal, without renal calculi, solid lesion, or hydronephrosis. Bladder is unremarkable. Stomach/Bowel: Stomach is within normal limits. Appendix appears normal. Colon is fluid-filled to the rectum. Vascular/Lymphatic: Aortic atherosclerosis. No enlarged abdominal or pelvic lymph nodes. Reproductive: Status post prostatectomy. Other: No abdominal wall hernia or abnormality. No ascites. Musculoskeletal: Redemonstrated superior endplate fracture deformity of the L4 vertebral body with substantial lytic component (series 7, image 155). Unchanged sclerotic superior endplate deformity of L1. Extensive bridging osteophytosis of the thoracic and upper lumbar spine in  keeping with DISH. IMPRESSION: 1. No noncontrast CT evidence of urinary tract calculus or hydronephrosis. 2. Redemonstrated superior endplate fracture deformity of the L4 vertebral body with substantial lytic component. This may reflect benign fracture resorption but is highly suspicious for underlying metastasis in this patient with known malignancy. 3. Colon is fluid-filled to the rectum, consistent with diarrheal illness. 4. Coarse cirrhotic morphology of the liver. Patient's known hepatocellular carcinoma not well appreciated by noncontrast CT. 5. Cholelithiasis. 6. Status post prostatectomy. Aortic Atherosclerosis (ICD10-I70.0). Electronically Signed   By: Marolyn JONETTA Jaksch M.D.   On: 12/09/2023 17:07   CT HEAD WO CONTRAST Result Date: 12/09/2023 CLINICAL DATA:  Altered mental status EXAM: CT HEAD WITHOUT CONTRAST TECHNIQUE: Contiguous axial images were obtained from the  base of the skull through the vertex without intravenous contrast. RADIATION DOSE REDUCTION: This exam was performed according to the departmental dose-optimization program which includes automated exposure control, adjustment of the mA and/or kV according to patient size and/or use of iterative reconstruction technique. COMPARISON:  10/24/2023 FINDINGS: Brain: No evidence of acute infarction, hemorrhage, hydrocephalus, extra-axial collection or mass lesion/mass effect. Periventricular white matter hypodensity. Vascular: No hyperdense vessel or unexpected calcification. Skull: Normal. Negative for fracture or focal lesion. Sinuses/Orbits: No acute finding. Other: None. IMPRESSION: No acute intracranial pathology.  Small-vessel white matter disease. Electronically Signed   By: Marolyn JONETTA Jaksch M.D.   On: 12/09/2023 17:00   CT ABDOMEN PELVIS WO CONTRAST Result Date: 11/11/2023 EXAM: CT ABDOMEN AND PELVIS WITHOUT CONTRAST 11/11/2023 05:18:37 PM TECHNIQUE: CT of the abdomen and pelvis was performed without the administration of intravenous contrast.  Multiplanar reformatted images are provided for review. Automated exposure control, iterative reconstruction, and/or weight based adjustment of the mA/kV was utilized to reduce the radiation dose to as low as reasonably achievable. COMPARISON: MR abdomen dated 07/13/2023. CLINICAL HISTORY: Diarrhea; c. diff. evaluate for toxic megacolon. FINDINGS: LOWER CHEST: Mild patchy bilateral lower lobe opacities, favoring atelectasis. Trace bilateral pleural effusions. LIVER: Cirrhosis. Hepatic lesions on prior MR are poorly visualized due to motion degradation and lack of contrast (image 32). GALLBLADDER AND BILE DUCTS: Gallbladder is unremarkable. No biliary ductal dilatation. SPLEEN: No acute abnormality. PANCREAS: No acute abnormality. ADRENAL GLANDS: No acute abnormality. KIDNEYS, URETERS AND BLADDER: Stable 2.5 cm renal adenoma (image 24), benign, no follow-up is recommended. No stones in the kidneys or ureters. No hydronephrosis. No perinephric or periureteral stranding. Urinary bladder is unremarkable. GI AND BOWEL: Small hiatal hernia. The appendix is not discretely visualized. No colonic wall thickening or inflammatory changes to suggest toxic megacolon. Mild sigmoid diverticulosis, without evidence of diverticulitis. PERITONEUM AND RETROPERITONEUM: No ascites. No free air. VASCULATURE: Atherosclerotic calcifications of the abdominal aorta and branch vessels. LYMPH NODES: No lymphadenopathy. REPRODUCTIVE ORGANS: Status post prostatectomy. BONES AND SOFT TISSUES: Mild superior endplate changes at L1 and L4, chronic. No acute osseous abnormality. No focal soft tissue abnormality. IMPRESSION: 1. No CT evidence of toxic megacolon. 2. Cirrhosis. Hepatic lesions on prior MR poorly visualized due to motion degradation and lack of contrast. 3. Additional ancillary findings as above. Electronically signed by: Pinkie Pebbles MD 11/11/2023 08:01 PM EDT RP Workstation: [id number]     .   Final Assessment and Plan:    Patient with normal CT renal stone study without obstructive etiology, does show evidence of pathologic fracture in the site of the patient's cancer concerning for metastasis, does show cirrhosis.  CT head unremarkable.  On repeat assessment, the patient had perked up considerably following initial fluid resuscitation.  He is on oral vancomycin  outpatient for C. difficile.  Suspect likely hypovolemia in the setting of his C. difficile infection.  Of note, patient found to have a UTI and antibiotics were ordered.  Medicine consulted for admission for further management of the patient's AKI and likely dehydration.      Final diagnoses:  AKI (acute kidney injury) (HCC)  Dehydration  C. difficile diarrhea  Acute cystitis with hematuria    ED Discharge Orders     None          Jerrol Agent, MD 12/09/23 2155    Jerrol Agent, MD 12/09/23 2251

## 2023-12-10 ENCOUNTER — Other Ambulatory Visit: Payer: Self-pay

## 2023-12-10 DIAGNOSIS — E876 Hypokalemia: Secondary | ICD-10-CM | POA: Diagnosis not present

## 2023-12-10 DIAGNOSIS — G9341 Metabolic encephalopathy: Secondary | ICD-10-CM | POA: Diagnosis not present

## 2023-12-10 DIAGNOSIS — R4182 Altered mental status, unspecified: Secondary | ICD-10-CM | POA: Diagnosis present

## 2023-12-10 DIAGNOSIS — E1142 Type 2 diabetes mellitus with diabetic polyneuropathy: Secondary | ICD-10-CM | POA: Diagnosis not present

## 2023-12-10 DIAGNOSIS — N390 Urinary tract infection, site not specified: Secondary | ICD-10-CM | POA: Diagnosis not present

## 2023-12-10 DIAGNOSIS — N1831 Chronic kidney disease, stage 3a: Secondary | ICD-10-CM | POA: Diagnosis not present

## 2023-12-10 DIAGNOSIS — Z66 Do not resuscitate: Secondary | ICD-10-CM | POA: Diagnosis present

## 2023-12-10 DIAGNOSIS — N179 Acute kidney failure, unspecified: Secondary | ICD-10-CM | POA: Diagnosis not present

## 2023-12-10 DIAGNOSIS — A0472 Enterocolitis due to Clostridium difficile, not specified as recurrent: Secondary | ICD-10-CM | POA: Diagnosis present

## 2023-12-10 DIAGNOSIS — Z743 Need for continuous supervision: Secondary | ICD-10-CM | POA: Diagnosis not present

## 2023-12-10 DIAGNOSIS — E119 Type 2 diabetes mellitus without complications: Secondary | ICD-10-CM | POA: Diagnosis not present

## 2023-12-10 DIAGNOSIS — I509 Heart failure, unspecified: Secondary | ICD-10-CM

## 2023-12-10 DIAGNOSIS — T501X5A Adverse effect of loop [high-ceiling] diuretics, initial encounter: Secondary | ICD-10-CM | POA: Diagnosis not present

## 2023-12-10 DIAGNOSIS — Z794 Long term (current) use of insulin: Secondary | ICD-10-CM | POA: Diagnosis not present

## 2023-12-10 DIAGNOSIS — I482 Chronic atrial fibrillation, unspecified: Secondary | ICD-10-CM | POA: Diagnosis present

## 2023-12-10 DIAGNOSIS — E860 Dehydration: Secondary | ICD-10-CM | POA: Diagnosis present

## 2023-12-10 DIAGNOSIS — I10 Essential (primary) hypertension: Secondary | ICD-10-CM | POA: Diagnosis not present

## 2023-12-10 DIAGNOSIS — E1140 Type 2 diabetes mellitus with diabetic neuropathy, unspecified: Secondary | ICD-10-CM | POA: Diagnosis present

## 2023-12-10 DIAGNOSIS — R404 Transient alteration of awareness: Secondary | ICD-10-CM | POA: Diagnosis not present

## 2023-12-10 DIAGNOSIS — Z6841 Body Mass Index (BMI) 40.0 and over, adult: Secondary | ICD-10-CM | POA: Diagnosis not present

## 2023-12-10 DIAGNOSIS — E785 Hyperlipidemia, unspecified: Secondary | ICD-10-CM | POA: Diagnosis present

## 2023-12-10 DIAGNOSIS — C220 Liver cell carcinoma: Secondary | ICD-10-CM | POA: Diagnosis present

## 2023-12-10 DIAGNOSIS — E66813 Obesity, class 3: Secondary | ICD-10-CM | POA: Diagnosis present

## 2023-12-10 DIAGNOSIS — I480 Paroxysmal atrial fibrillation: Secondary | ICD-10-CM | POA: Diagnosis present

## 2023-12-10 DIAGNOSIS — I428 Other cardiomyopathies: Secondary | ICD-10-CM | POA: Diagnosis not present

## 2023-12-10 DIAGNOSIS — E8721 Acute metabolic acidosis: Secondary | ICD-10-CM | POA: Diagnosis not present

## 2023-12-10 DIAGNOSIS — E1122 Type 2 diabetes mellitus with diabetic chronic kidney disease: Secondary | ICD-10-CM | POA: Diagnosis present

## 2023-12-10 DIAGNOSIS — I130 Hypertensive heart and chronic kidney disease with heart failure and stage 1 through stage 4 chronic kidney disease, or unspecified chronic kidney disease: Secondary | ICD-10-CM | POA: Diagnosis not present

## 2023-12-10 DIAGNOSIS — I5033 Acute on chronic diastolic (congestive) heart failure: Secondary | ICD-10-CM | POA: Diagnosis not present

## 2023-12-10 DIAGNOSIS — N3001 Acute cystitis with hematuria: Secondary | ICD-10-CM | POA: Diagnosis present

## 2023-12-10 DIAGNOSIS — Z7401 Bed confinement status: Secondary | ICD-10-CM | POA: Diagnosis not present

## 2023-12-10 DIAGNOSIS — D649 Anemia, unspecified: Secondary | ICD-10-CM | POA: Diagnosis not present

## 2023-12-10 DIAGNOSIS — F39 Unspecified mood [affective] disorder: Secondary | ICD-10-CM | POA: Diagnosis present

## 2023-12-10 LAB — GASTROINTESTINAL PANEL BY PCR, STOOL (REPLACES STOOL CULTURE)

## 2023-12-10 LAB — GLUCOSE, CAPILLARY
Glucose-Capillary: 109 mg/dL — ABNORMAL HIGH (ref 70–99)
Glucose-Capillary: 108 mg/dL — ABNORMAL HIGH (ref 70–99)

## 2023-12-10 LAB — BASIC METABOLIC PANEL WITH GFR
Anion gap: 13 (ref 5–15)
Anion gap: 13 (ref 5–15)
BUN: 52 mg/dL — ABNORMAL HIGH (ref 8–23)
BUN: 54 mg/dL — ABNORMAL HIGH (ref 8–23)
CO2: 16 mmol/L — ABNORMAL LOW (ref 22–32)
CO2: 17 mmol/L — ABNORMAL LOW (ref 22–32)
Calcium: 8.6 mg/dL — ABNORMAL LOW (ref 8.9–10.3)
Calcium: 8.6 mg/dL — ABNORMAL LOW (ref 8.9–10.3)
Chloride: 106 mmol/L (ref 98–111)
Chloride: 105 mmol/L (ref 98–111)
Creatinine, Ser: 6.11 mg/dL — ABNORMAL HIGH (ref 0.61–1.24)
Creatinine, Ser: 6.41 mg/dL — ABNORMAL HIGH (ref 0.61–1.24)
GFR, Estimated: 9 mL/min — ABNORMAL LOW (ref >60)
GFR, Estimated: 8 mL/min — ABNORMAL LOW (ref >60)
Glucose, Bld: 93 mg/dL (ref 70–99)
Glucose, Bld: 113 mg/dL — ABNORMAL HIGH (ref 70–99)
Potassium: 3.8 mmol/L (ref 3.5–5.1)
Potassium: 3.6 mmol/L (ref 3.5–5.1)
Sodium: 135 mmol/L (ref 135–145)
Sodium: 135 mmol/L (ref 135–145)

## 2023-12-10 LAB — URINALYSIS, W/ REFLEX TO CULTURE (INFECTION SUSPECTED)
Bilirubin Urine: NEGATIVE
Glucose, UA: NEGATIVE mg/dL
Ketones, ur: NEGATIVE mg/dL
Nitrite: NEGATIVE
Protein, ur: 30 mg/dL — AB
Specific Gravity, Urine: 1.02 (ref 1.005–1.030)
pH: 5.5 (ref 5.0–8.0)

## 2023-12-10 LAB — SODIUM, URINE, RANDOM: Sodium, Ur: 68 mmol/L

## 2023-12-10 LAB — CBG MONITORING, ED
Glucose-Capillary: 93 mg/dL (ref 70–99)
Glucose-Capillary: 106 mg/dL — ABNORMAL HIGH (ref 70–99)

## 2023-12-10 LAB — URINE CULTURE

## 2023-12-10 LAB — CBC
HCT: 38.4 % — ABNORMAL LOW (ref 39.0–52.0)
Hemoglobin: 12.3 g/dL — ABNORMAL LOW (ref 13.0–17.0)
MCH: 28 pg (ref 26.0–34.0)
MCHC: 32 g/dL (ref 30.0–36.0)
MCV: 87.3 fL (ref 80.0–100.0)
Platelets: 388 K/uL (ref 150–400)
RBC: 4.4 MIL/uL (ref 4.22–5.81)
RDW: 18.1 % — ABNORMAL HIGH (ref 11.5–15.5)
WBC: 10.1 K/uL (ref 4.0–10.5)
nRBC: 0 % (ref 0.0–0.2)

## 2023-12-10 LAB — C DIFFICILE QUICK SCREEN W PCR REFLEX
C Diff antigen: NEGATIVE
C Diff interpretation: NOT DETECTED
C Diff toxin: NEGATIVE

## 2023-12-10 LAB — MRSA NEXT GEN BY PCR, NASAL: MRSA by PCR Next Gen: NOT DETECTED

## 2023-12-10 LAB — CREATININE, URINE, RANDOM: Creatinine, Urine: 65 mg/dL

## 2023-12-10 MED ORDER — ALLOPURINOL 100 MG PO TABS
100.0000 mg | ORAL_TABLET | Freq: Every morning | ORAL | Status: DC
Start: 2023-12-10 — End: 2023-12-10
  Administered 2023-12-10: 100 mg via ORAL
  Filled 2023-12-10: qty 1

## 2023-12-10 MED ORDER — INSULIN ASPART 100 UNIT/ML IJ SOLN
0.0000 [IU] | Freq: Three times a day (TID) | INTRAMUSCULAR | Status: DC
  Administered 2023-12-11 – 2023-12-12 (×2): 2 [IU] via SUBCUTANEOUS
  Administered 2023-12-13 (×2): 1 [IU] via SUBCUTANEOUS
  Administered 2023-12-14 (×2): 2 [IU] via SUBCUTANEOUS
  Administered 2023-12-15: 1 [IU] via SUBCUTANEOUS
  Administered 2023-12-15: 2 [IU] via SUBCUTANEOUS
  Administered 2023-12-16 (×2): 1 [IU] via SUBCUTANEOUS
  Administered 2023-12-17: 2 [IU] via SUBCUTANEOUS
  Administered 2023-12-17: 3 [IU] via SUBCUTANEOUS
  Administered 2023-12-17 – 2023-12-18 (×2): 1 [IU] via SUBCUTANEOUS

## 2023-12-10 MED ORDER — PREGABALIN 25 MG PO CAPS
50.0000 mg | ORAL_CAPSULE | Freq: Two times a day (BID) | ORAL | Status: DC
  Administered 2023-12-10 – 2023-12-18 (×17): 50 mg via ORAL
  Filled 2023-12-10 (×17): qty 2

## 2023-12-10 MED ORDER — FLUOXETINE HCL 10 MG PO CAPS
10.0000 mg | ORAL_CAPSULE | Freq: Every evening | ORAL | Status: DC
  Administered 2023-12-10 – 2023-12-17 (×8): 10 mg via ORAL
  Filled 2023-12-10 (×8): qty 1

## 2023-12-10 MED ORDER — EZETIMIBE 10 MG PO TABS
10.0000 mg | ORAL_TABLET | Freq: Every morning | ORAL | Status: DC
  Administered 2023-12-10 – 2023-12-18 (×9): 10 mg via ORAL
  Filled 2023-12-10 (×9): qty 1

## 2023-12-10 MED ORDER — CHLORHEXIDINE GLUCONATE CLOTH 2 % EX PADS
6.0000 | MEDICATED_PAD | Freq: Every day | CUTANEOUS | Status: DC
  Administered 2023-12-10 – 2023-12-18 (×9): 6 via TOPICAL

## 2023-12-10 MED ORDER — HYDRALAZINE HCL 20 MG/ML IJ SOLN: 10.0000 mg | INTRAMUSCULAR | Status: DC | PRN

## 2023-12-10 MED ORDER — GUAIFENESIN 100 MG/5ML PO LIQD
5.0000 mL | ORAL | Status: DC | PRN
  Administered 2023-12-11 – 2023-12-18 (×3): 5 mL via ORAL
  Filled 2023-12-10 (×3): qty 5

## 2023-12-10 MED ORDER — SENNOSIDES-DOCUSATE SODIUM 8.6-50 MG PO TABS: 1.0000 | ORAL_TABLET | Freq: Every evening | ORAL | Status: DC | PRN

## 2023-12-10 MED ORDER — METOPROLOL SUCCINATE ER 100 MG PO TB24
100.0000 mg | ORAL_TABLET | Freq: Two times a day (BID) | ORAL | Status: DC
  Administered 2023-12-10 – 2023-12-18 (×17): 100 mg via ORAL
  Filled 2023-12-10 (×4): qty 1
  Filled 2023-12-10: qty 4
  Filled 2023-12-10 (×12): qty 1

## 2023-12-10 MED ORDER — SODIUM CHLORIDE 0.9 % IV SOLN: INTRAVENOUS | Status: DC

## 2023-12-10 MED ORDER — SODIUM CHLORIDE 0.9 % IV SOLN
1.0000 g | INTRAVENOUS | Status: DC
  Administered 2023-12-10: 1 g via INTRAVENOUS
  Filled 2023-12-10: qty 10

## 2023-12-10 MED ORDER — LOPERAMIDE HCL 2 MG PO CAPS
4.0000 mg | ORAL_CAPSULE | Freq: Once | ORAL | Status: AC
  Administered 2023-12-10: 4 mg via ORAL
  Filled 2023-12-10: qty 2

## 2023-12-10 MED ORDER — INSULIN ASPART 100 UNIT/ML IJ SOLN: 0.0000 [IU] | Freq: Every day | INTRAMUSCULAR | Status: DC

## 2023-12-10 MED ORDER — PANTOPRAZOLE SODIUM 40 MG PO TBEC
40.0000 mg | DELAYED_RELEASE_TABLET | Freq: Every day | ORAL | Status: DC
  Administered 2023-12-10 – 2023-12-18 (×9): 40 mg via ORAL
  Filled 2023-12-10 (×9): qty 1

## 2023-12-10 MED ORDER — ONDANSETRON HCL 4 MG/2ML IJ SOLN: 4.0000 mg | Freq: Four times a day (QID) | INTRAMUSCULAR | Status: DC | PRN

## 2023-12-10 MED ORDER — APIXABAN 5 MG PO TABS
5.0000 mg | ORAL_TABLET | Freq: Two times a day (BID) | ORAL | Status: DC
  Administered 2023-12-10: 5 mg via ORAL
  Filled 2023-12-10: qty 1

## 2023-12-10 MED ORDER — APIXABAN 2.5 MG PO TABS
2.5000 mg | ORAL_TABLET | Freq: Two times a day (BID) | ORAL | Status: DC
  Administered 2023-12-10 – 2023-12-12 (×4): 2.5 mg via ORAL
  Filled 2023-12-10 (×4): qty 1

## 2023-12-10 MED ORDER — ACETAMINOPHEN 325 MG PO TABS: 650.0000 mg | ORAL_TABLET | Freq: Four times a day (QID) | ORAL | Status: DC | PRN

## 2023-12-10 MED ORDER — ACETAMINOPHEN 650 MG RE SUPP: 650.0000 mg | Freq: Four times a day (QID) | RECTAL | Status: DC | PRN

## 2023-12-10 MED ORDER — TRAZODONE HCL 50 MG PO TABS
25.0000 mg | ORAL_TABLET | Freq: Every day | ORAL | Status: DC
  Administered 2023-12-10 – 2023-12-17 (×8): 25 mg via ORAL
  Filled 2023-12-10 (×8): qty 1

## 2023-12-10 MED ORDER — IPRATROPIUM-ALBUTEROL 0.5-2.5 (3) MG/3ML IN SOLN
3.0000 mL | RESPIRATORY_TRACT | Status: DC | PRN
  Administered 2023-12-17 – 2023-12-18 (×2): 3 mL via RESPIRATORY_TRACT
  Filled 2023-12-10 (×2): qty 3

## 2023-12-10 NOTE — Progress Notes (Signed)
 Patient arrives to [id number] at this time

## 2023-12-10 NOTE — Consult Note (Signed)
 Nephrology Consult   Requesting provider: Dr. Caleen Service requesting consult: [facility] Reason for consult: AKI  Assessment/Recommendations:  AKI on CKD3a -baseline seems to be around 1.2-1.3. Suspecting AKI is secondary to prolonged pre-renal injury. No obstruction on CT -c/w gentle fluids for now. There is a concern for cardiorenal but uncertain at this junction, I believe a repeat echo can help guide his volume status as below. -Avoid nephrotoxic medications including NSAIDs and iodinated intravenous contrast exposure unless the latter is absolutely indicated.  Preferred narcotic agents for pain control are hydromorphone , fentanyl , and methadone. Morphine  should not be used. Avoid Baclofen and avoid oral sodium phosphate and magnesium  citrate based laxatives / bowel preps. Continue strict Input and Output monitoring. Will monitor the patient closely with you and intervene or adjust therapy as indicated by changes in clinical status/labs   Acute on chronic HFpEF exacerbation -BNP elevated. CXR with vascular congestion. I believe BNP can also be elevated in cases of cirrhosis and renal insufficiency -difficult to fully assess total body volume status given his body habitus. Consider repeating echo, can help us  elucidate volume status further. Of note, he is receiving gentle fluids. Not hypoxic, on RA. If any concerns with his resp status then recommend stopping fluids and giving high dose lasix   Acidosis -likely secondary to chronic diarrhea, lactic acidosis, AKI -starting bicarb  UTI -receiving rocephin   H/o Cdiff  Diarrhea -repeat cdiff negative here. Per primary  DM2 -per primary  Recommendations conveyed to primary service.   Ephriam Stank [facility] 12/10/2023 11:23 AM  _____________________________________________________________________________________  History of Present Illness: Steve Jensen is a/an 83 y.o. male with a past medical history of hypertension,  DM2, HFpEF, OSA, obesity, hepatocellular carcinoma, history of prostate cancer status post prostatectomy, A-fib on Eliquis , GERD who presents to [facility] with altered mental status and worsening renal function which was done at his nursing facility.  Patient did report that he has been having ongoing diarrhea, was recently treated for C. difficile.  In the ER, was found to have a creatinine up to 6, bicarb 18, elevated lactate.  UA with signs of infection therefore started on Rocephin .  Chest x-ray did reveal mild cardiomegaly with central pulmonary vascular congestion.  CT head negative.  CT renal stone without any evidence of obstruction/hydronephrosis. Patient seen and examined in the ER. Patient reports that he does not know why he was sent here. He reports that his diarrhea has been nonstop at home. He denies any NSAIDs being given to him. He does report a cough. Denies any chest pain, SOB, swelling, decreased urinary frequency, dysuria, hematuria.   Medications:  Current Facility-Administered Medications  Medication Dose Route Frequency Provider Last Rate Last Admin   0.9 %  sodium chloride  infusion   Intravenous Continuous Amin, Ankit C, MD       acetaminophen  (TYLENOL ) tablet 650 mg  650 mg Oral Q6H PRN Alfornia Madison, MD       Or   acetaminophen  (TYLENOL ) suppository 650 mg  650 mg Rectal Q6H PRN Alfornia Madison, MD       apixaban  (ELIQUIS ) tablet 5 mg  5 mg Oral BID Rathore, Vasundhra, MD   5 mg at 12/10/23 1038   cefTRIAXone  (ROCEPHIN ) 1 g in sodium chloride  0.9 % 100 mL IVPB  1 g Intravenous Q24H Rathore, Vasundhra, MD       Chlorhexidine  Gluconate Cloth 2 % PADS 6 each  6 each Topical Daily Amin, Ankit C, MD       ezetimibe  (  ZETIA ) tablet 10 mg  10 mg Oral q AM Rathore, Vasundhra, MD   10 mg at 12/10/23 9177   FLUoxetine  (PROZAC ) capsule 10 mg  10 mg Oral QPM Alfornia Madison, MD       guaiFENesin  (ROBITUSSIN) 100 MG/5ML liquid 5 mL  5 mL Oral Q4H PRN Amin, Ankit C, MD        hydrALAZINE  (APRESOLINE ) injection 10 mg  10 mg Intravenous Q4H PRN Amin, Ankit C, MD       insulin  aspart (novoLOG ) injection 0-5 Units  0-5 Units Subcutaneous QHS Rathore, Vasundhra, MD       insulin  aspart (novoLOG ) injection 0-9 Units  0-9 Units Subcutaneous TID WC Alfornia Madison, MD       ipratropium-albuterol  (DUONEB) 0.5-2.5 (3) MG/3ML nebulizer solution 3 mL  3 mL Nebulization Q4H PRN Amin, Ankit C, MD       metoprolol  succinate (TOPROL -XL) 24 hr tablet 100 mg  100 mg Oral BID Rathore, Vasundhra, MD   100 mg at 12/10/23 1038   ondansetron  (ZOFRAN ) injection 4 mg  4 mg Intravenous Q6H PRN Amin, Ankit C, MD       pantoprazole  (PROTONIX ) EC tablet 40 mg  40 mg Oral Daily Rathore, Vasundhra, MD   40 mg at 12/10/23 1039   pregabalin  (LYRICA ) capsule 50 mg  50 mg Oral BID Rathore, Vasundhra, MD   50 mg at 12/10/23 1038   senna-docusate (Senokot-S) tablet 1 tablet  1 tablet Oral QHS PRN Amin, Ankit C, MD       traZODone  (DESYREL ) tablet 25 mg  25 mg Oral QHS Rathore, Vasundhra, MD       Current Outpatient Medications  Medication Sig Dispense Refill   allopurinol  (ZYLOPRIM ) 100 MG tablet Take 100 mg by mouth in the morning.     apixaban  (ELIQUIS ) 5 MG TABS tablet Take 5 mg by mouth 2 (two) times daily.     Cholecalciferol (VITAMIN D) 50 MCG (2000 UT) CAPS Take 2,000 Units by mouth in the morning.     ezetimibe  (ZETIA ) 10 MG tablet Take 10 mg by mouth in the morning.     ferrous sulfate 325 (65 FE) MG tablet Take 325 mg by mouth daily with breakfast.     FLUoxetine  (PROZAC ) 10 MG capsule Take 10 mg by mouth every evening.     furosemide  (LASIX ) 40 MG tablet Take 40 mg by mouth in the morning.     guaiFENesin  (MUCINEX ) 600 MG 12 hr tablet Take 600 mg by mouth 2 (two) times daily as needed for cough or to loosen phlegm.     insulin  aspart (NOVOLOG ) 100 UNIT/ML injection Inject 0-10 Units into the skin 3 (three) times daily before meals. Sliding scale: if sugar is 150-200 = 2 units, if 201-250 =  4 units, if 251-300 = 6 units, if 301-350 = 8 units, if 351-400 = 10 units, give blood sugar is greater than 400 then call the MD.     Insulin  Glargine-Lixisenatide (SOLIQUA) 100-33 UNT-MCG/ML SOPN Inject 22 Units into the skin in the morning.     Magnesium  Glycinate 100 MG CAPS Take 1 capsule by mouth in the morning and at bedtime.     metFORMIN  (GLUCOPHAGE ) 1000 MG tablet Take 1,000 mg by mouth 2 (two) times daily with a meal.  0   metoprolol  succinate (TOPROL -XL) 100 MG 24 hr tablet Take 1 tablet (100 mg total) by mouth 2 (two) times daily. Take with or immediately following a meal.  Multiple Vitamins-Minerals (MULTIVITAMIN MEN 50+) TABS Take 1 tablet by mouth every evening.     Omega-3 Fatty Acids (FISH OIL) 1000 MG CAPS Take 1,000 mg by mouth 2 (two) times daily.     omeprazole  (PRILOSEC) 20 MG capsule Take 1 capsule (20 mg total) by mouth daily. (Patient taking differently: Take 20 mg by mouth in the morning.)     potassium chloride  (KLOR-CON  M) 20 MEQ tablet Please take 40 mEq daily for 5 days and then 20 mEq daily (Patient taking differently: Take 40 mEq by mouth daily.)     pregabalin  (LYRICA ) 50 MG capsule Take 1 capsule (50 mg total) by mouth 2 (two) times daily. 30 capsule 0   traZODone  (DESYREL ) 50 MG tablet Take 25 mg by mouth at bedtime.     zinc  oxide 20 % ointment Apply 1 Application topically See admin instructions. Every shift       ALLERGIES Lipitor [atorvastatin], Zithromax [azithromycin], Zocor [simvastatin], Dificid  [fidaxomicin ], and Norvasc  [amlodipine ]  MEDICAL HISTORY Past Medical History:  Diagnosis Date   Anemia    Diabetes mellitus type II, controlled (HCC)    with neuropathy   Dysrhythmia    A-Fib. cardioversion done   GERD (gastroesophageal reflux disease)    Hyperlipidemia    Hypertension    Morbid obesity (HCC)    Neuromuscular disorder (HCC)    feet   OSA (obstructive sleep apnea)    On BiPAP at 15/11cm H2O   Prostate cancer (HCC)    Shingles     on face Nov 2010   Urinary incontinence      SOCIAL HISTORY Social History   Socioeconomic History   Marital status: Widowed    Spouse name: Not on file   Number of children: Not on file   Years of education: Not on file   Highest education level: Not on file  Occupational History   Not on file  Tobacco Use   Smoking status: Former    Current packs/day: 0.00    Average packs/day: 1 pack/day for 15.0 years (15.0 ttl pk-yrs)    Types: Cigarettes    Start date: 05/26/1960    Quit date: 05/27/1975    Years since quitting: 48.5   Smokeless tobacco: Never  Vaping Use   Vaping status: Never Used  Substance and Sexual Activity   Alcohol use: No   Drug use: No   Sexual activity: Not on file  Other Topics Concern   Not on file  Social History Narrative   Not on file   Social Drivers of Health   Financial Resource Strain: Not on file  Food Insecurity: No Food Insecurity (11/10/2023)   Hunger Vital Sign    Worried About Running Out of Food in the Last Year: Never true    Ran Out of Food in the Last Year: Never true  Transportation Needs: No Transportation Needs (11/10/2023)   PRAPARE - Administrator, Civil Service (Medical): No    Lack of Transportation (Non-Medical): No  Physical Activity: Not on file  Stress: Not on file  Social Connections: Socially Isolated (11/10/2023)   Social Connection and Isolation Panel    Frequency of Communication with Friends and Family: More than three times a week    Frequency of Social Gatherings with Friends and Family: More than three times a week    Attends Religious Services: Never    Database administrator or Organizations: No    Attends Banker Meetings: Never  Marital Status: Widowed  Intimate Partner Violence: Not At Risk (11/10/2023)   Humiliation, Afraid, Rape, and Kick questionnaire    Fear of Current or Ex-Partner: No    Emotionally Abused: No    Physically Abused: No    Sexually Abused: No      FAMILY HISTORY Family History  Problem Relation Age of Onset   Hypertension Brother    Diabetes Brother    Cancer Brother        bladder cancer   Diabetes Brother    CVA Brother    Hypertension Brother    Prostate cancer Brother    Diabetes Brother    Hypertension Brother    Heart disease Brother        CABG   Heart attack Brother    Stroke Brother      Review of Systems: 12 systems reviewed Otherwise as per HPI, all other systems reviewed and negative  Physical Exam: Vitals:   12/10/23 0755 12/10/23 1030  BP:  100/62  Pulse:  87  Resp:  14  Temp: (!) 97.4 F (36.3 C)   SpO2:  100%   No intake/output data recorded.  Intake/Output Summary (Last 24 hours) at 12/10/2023 1123 Last data filed at 12/10/2023 0524 Gross per 24 hour  Intake 1619.82 ml  Output --  Net 1619.82 ml   General: chronically ill appearing, no acute distress HEENT: anicteric sclera, oropharynx clear without lesions CV: regular rate, normal rhythm, no murmurs, no gallops, no rubs,  Lungs: decreased breath sounds bibasilar, no w/r/r/c appreciated, normal wob, speaking in full sentences/unlabored Abd: obese, soft, non-tender, non-distended Skin: no visible lesions or rashes Psych: alert, engaged, appropriate mood and affect Musculoskeletal: trace dependent edema b/l LEs Neuro: normal speech, awake, alert, following commands  Test Results Reviewed Lab Results  Component Value Date   NA 135 12/10/2023   K 3.8 12/10/2023   CL 106 12/10/2023   CO2 16 (L) 12/10/2023   BUN 52 (H) 12/10/2023   CREATININE 6.11 (H) 12/10/2023   GFR 74.61 03/09/2014   CALCIUM  8.6 (L) 12/10/2023   ALBUMIN  2.8 (L) 12/09/2023     I have reviewed all relevant outside healthcare records related to the patient's kidney injury.

## 2023-12-10 NOTE — ED Notes (Signed)
 5W secretary notified patient to come up, transport has been called

## 2023-12-10 NOTE — Progress Notes (Signed)
 PROGRESS NOTE    Steve Jensen  FMW:[id number] DOB: 06-Apr-1941 DOA: 12/09/2023 PCP: System, Provider Not In    Brief Narrative:  83 y.o. male with medical history significant of A-fib on Eliquis , hypertension, chronic HFpEF, OSA on CPAP, morbid obesity, type 2 diabetes, CKD stage IIIa, GERD, hepatocellular carcinoma, history of prostate cancer status post prostatectomy, falls.  Multiple recent hospital admissions.  Admitted 5/31-6/6 for altered mental status after having a fall due to weakness with reported low blood pressures.  Patient was noted not to have significant orthostatic hypotension when checked.  He was found to have an acute compression fracture of L4 for which neurosurgery had recommended LSO brace and outpatient follow-up.  Admitted again 6/15-6/24 for hypotension and C. difficile diarrhea.  Initially treated with Dificid  but had a rash so subsequently switched to oral vancomycin  after his rash improved.     Patient presents to the ED for evaluation of altered mental status/confusion and worsening renal function on labs done at his nursing facility.    During this admission found to have concerns of AKI on CKD 3A, decompensated heart failure, C. difficile and urinary tract infection.   Assessment & Plan:  Principal Problem:   AKI (acute kidney injury) (HCC) Active Problems:   Acute metabolic acidosis   C. difficile diarrhea   Acute metabolic encephalopathy   Type 2 diabetes mellitus (HCC)   CHF exacerbation (HCC)   UTI (urinary tract infection)      AKI on CKD stage IIIa Mild metabolic acidosis Baseline creatinine 1.3, admission creatinine 6.07.  Could be prerenal in nature but does have elevated BNP.  Although chest x-ray shows some vascular congestion, he is on room air.  Will give gentle hydration.  Avoid any nephrotoxic drugs. -CT renal study does not show any obvious evidence of obstruction/renal stone - Monitor urine output and creatinine -Placed Foley.     Acute on chronic HFpEF Last echo done in April 2025 showing preserved EF.  However, chest x-ray showing pulmonary vascular congestion and BNP has increased from baseline.  Holding diuretics at this time given concern for AKI.  Patient is not hypoxic. Will repeat Echo   UTI UA suggestive of urinary tract infection.  Urine cultures not sent, will order Empiric IV Rocephin    Acute metabolic encephalopathy Likely secondary to UTI and AKI.  CT head is negative.  Ammonia is normal.  Check TSH.   History of C. difficile diarrhea Recently discharged on 11/13/2023 with 14 days of p.o. vancomycin .  Did not tolerate Dificid  as patient developed rash. Repeat C diff is neg and GI panel - pending    History of hepatocellular carcinoma During hospital admission 5/31-6/6, patient was found to have an acute compression fracture of L4 for which neurosurgery had recommended LSO brace and outpatient follow-up.  Repeat CT done at this time redemonstrated superior endplate fracture deformity of the L4 vertebral body with substantial lytic component which may reflect benign fracture resorption but is highly suspicious for underlying metastases given known history of malignancy.  Patient is not endorsing back pain at this time.  He will need outpatient oncology and neurosurgery follow-up.   A-fib Currently rate controlled.  Continue metoprolol  and Eliquis .   OSA Continue nightly CPAP.   Type 2 diabetes Sliding scale and Accu-Cheks   GERD Continue PPI.   Gout Hold allopurinol    Hyperlipidemia Continue Zetia .   Mood disorder/insomnia Continue home medications.   Chronic pain/neuropathy Continue Lyrica .   DVT prophylaxis: Eliquis  Code Status: DNR/DNI (  discussed with the patient) Family Communication: No family available at this time. Status is: Inpatient Remains inpatient appropriate because: Cont hospital stay for AKI    Subjective:  Doing ok Still having diarrhea.  Feels ok.    Examination:  General exam: Appears calm and comfortable  Respiratory system: Clear to auscultation. Respiratory effort normal. Cardiovascular system: S1 & S2 heard, RRR. No JVD, murmurs, rubs, gallops or clicks. No pedal edema. Gastrointestinal system: Abdomen is nondistended, soft and nontender. No organomegaly or masses felt. Normal bowel sounds heard. Central nervous system: Alert and oriented. No focal neurological deficits. Extremities: Symmetric 5 x 5 power. Skin: No rashes, lesions or ulcers Psychiatry: Judgement and insight appear normal. Mood & affect appropriate.                Diet Orders (From admission, onward)     Start     Ordered   12/10/23 0352  Diet heart healthy/carb modified Room service appropriate? Yes; Fluid consistency: Thin  Diet effective now       Question Answer Comment  Diet-HS Snack? Nothing   Room service appropriate? Yes   Fluid consistency: Thin      12/10/23 0353            Objective: Vitals:   12/10/23 0745 12/10/23 0755 12/10/23 1030 12/10/23 1211  BP: 116/69  100/62   Pulse: 81  87   Resp: 14  14   Temp:  (!) 97.4 F (36.3 C)  (!) 97.3 F (36.3 C)  TempSrc:  Oral  Oral  SpO2: 96%  100%     Intake/Output Summary (Last 24 hours) at 12/10/2023 1250 Last data filed at 12/10/2023 0524 Gross per 24 hour  Intake 1619.82 ml  Output --  Net 1619.82 ml   There were no vitals filed for this visit.  Scheduled Meds:  apixaban   5 mg Oral BID   Chlorhexidine  Gluconate Cloth  6 each Topical Daily   ezetimibe   10 mg Oral q AM   FLUoxetine   10 mg Oral QPM   insulin  aspart  0-5 Units Subcutaneous QHS   insulin  aspart  0-9 Units Subcutaneous TID WC   metoprolol  succinate  100 mg Oral BID   pantoprazole   40 mg Oral Daily   pregabalin   50 mg Oral BID   traZODone   25 mg Oral QHS   Continuous Infusions:  sodium chloride  75 mL/hr at 12/10/23 1139   cefTRIAXone  (ROCEPHIN )  IV      Nutritional status     There is no height  or weight on file to calculate BMI.  Data Reviewed:   CBC: Recent Labs  Lab 12/09/23 1601 12/09/23 1629 12/10/23 0525  WBC 11.5*  --  10.1  NEUTROABS 7.7  --   --   HGB 12.9* 13.3 12.3*  HCT 40.5 39.0 38.4*  MCV 87.3  --  87.3  PLT 436*  --  388   Basic Metabolic Panel: Recent Labs  Lab 12/09/23 1601 12/09/23 1629 12/10/23 0525  NA 137 137 135  K 4.2 4.2 3.8  CL 104  --  106  CO2 18*  --  16*  GLUCOSE 83  --  93  BUN 51*  --  52*  CREATININE 6.07*  --  6.11*  CALCIUM  9.1  --  8.6*   GFR: CrCl cannot be calculated (Unknown ideal weight.). Liver Function Tests: Recent Labs  Lab 12/09/23 1601  AST 20  ALT 10  ALKPHOS 95  BILITOT 0.6  PROT 5.5*  ALBUMIN  2.8*   No results for input(s): LIPASE, AMYLASE in the last 168 hours. Recent Labs  Lab 12/09/23 1601  AMMONIA 14   Coagulation Profile: No results for input(s): INR, PROTIME in the last 168 hours. Cardiac Enzymes: No results for input(s): CKTOTAL, CKMB, CKMBINDEX, TROPONINI in the last 168 hours. BNP (last 3 results) No results for input(s): PROBNP in the last 8760 hours. HbA1C: No results for input(s): HGBA1C in the last 72 hours. CBG: Recent Labs  Lab 12/09/23 1547 12/10/23 0713 12/10/23 1204  GLUCAP 88 93 106*   Lipid Profile: No results for input(s): CHOL, HDL, LDLCALC, TRIG, CHOLHDL, LDLDIRECT in the last 72 hours. Thyroid  Function Tests: No results for input(s): TSH, T4TOTAL, FREET4, T3FREE, THYROIDAB in the last 72 hours. Anemia Panel: No results for input(s): VITAMINB12, FOLATE, FERRITIN, TIBC, IRON, RETICCTPCT in the last 72 hours. Sepsis Labs: Recent Labs  Lab 12/09/23 1633 12/09/23 1955  LATICACIDVEN 3.9* 2.7*    Recent Results (from the past 240 hours)  Blood culture (routine x 2)     Status: None (Preliminary result)   Collection Time: 12/09/23  4:20 PM   Specimen: BLOOD LEFT FOREARM  Result Value Ref Range Status    Specimen Description BLOOD LEFT FOREARM  Final   Special Requests   Final    BOTTLES DRAWN AEROBIC AND ANAEROBIC Blood Culture adequate volume   Culture   Final    NO GROWTH < 12 HOURS Performed at [facility], [address] [place], [place] [address]    Report Status PENDING  Incomplete  Blood culture (routine x 2)     Status: None (Preliminary result)   Collection Time: 12/09/23  4:44 PM   Specimen: BLOOD RIGHT ARM  Result Value Ref Range Status   Specimen Description BLOOD RIGHT ARM  Final   Special Requests   Final    BOTTLES DRAWN AEROBIC AND ANAEROBIC Blood Culture adequate volume   Culture   Final    NO GROWTH < 12 HOURS Performed at [facility], [address] [place], [place] [address]    Report Status PENDING  Incomplete  C Difficile Quick Screen w PCR reflex     Status: None   Collection Time: 12/10/23  9:50 AM   Specimen: STOOL  Result Value Ref Range Status   C Diff antigen NEGATIVE NEGATIVE Final   C Diff toxin NEGATIVE NEGATIVE Final   C Diff interpretation No C. difficile detected.  Final    Comment: Performed at [facility], [address], [place], [place] [address]         Radiology Studies: DG Chest Port 1 View Result Date: 12/09/2023 CLINICAL DATA:  Altered mental status EXAM: PORTABLE CHEST 1 VIEW COMPARISON:  Chest x-ray 11/09/2023 FINDINGS: The heart is mildly enlarged. There is central pulmonary vascular congestion. There is atelectasis or scarring in the left mid lung. There is no pleural effusion or pneumothorax. No acute fractures are seen. IMPRESSION: Mild cardiomegaly with central pulmonary vascular congestion. Electronically Signed   By: Greig Pique M.D.   On: 12/09/2023 18:00   CT Renal Stone Study Result Date: 12/09/2023 CLINICAL DATA:  Abdominal and flank pain, stone suspected history of prostate cancer, history of hepatocellular carcinoma * Tracking Code: BO * EXAM: CT ABDOMEN AND PELVIS WITHOUT CONTRAST TECHNIQUE:  Multidetector CT imaging of the abdomen and pelvis was performed following the standard protocol without IV contrast. RADIATION DOSE REDUCTION: This exam was performed according to the  departmental dose-optimization program which includes automated exposure control, adjustment of the mA and/or kV according to patient size and/or use of iterative reconstruction technique. COMPARISON:  11/11/2023 FINDINGS: Lower chest: Cardiomegaly. Unchanged atelectasis or consolidation of the right lung base. Unchanged trace right pleural effusion. Small hiatal hernia. Hepatobiliary: Coarse cirrhotic morphology of the liver. Patient's known hepatocellular carcinoma not well appreciated by noncontrast CT. Tiny gallstones. Gallbladder wall thickening, or biliary dilatation. Pancreas: Unremarkable. No pancreatic ductal dilatation or surrounding inflammatory changes. Spleen: Normal in size without significant abnormality. Adrenals/Urinary Tract: Unchanged benign right adrenal adenoma, requiring no further follow-up or characterization. Kidneys are normal, without renal calculi, solid lesion, or hydronephrosis. Bladder is unremarkable. Stomach/Bowel: Stomach is within normal limits. Appendix appears normal. Colon is fluid-filled to the rectum. Vascular/Lymphatic: Aortic atherosclerosis. No enlarged abdominal or pelvic lymph nodes. Reproductive: Status post prostatectomy. Other: No abdominal wall hernia or abnormality. No ascites. Musculoskeletal: Redemonstrated superior endplate fracture deformity of the L4 vertebral body with substantial lytic component (series 7, image 155). Unchanged sclerotic superior endplate deformity of L1. Extensive bridging osteophytosis of the thoracic and upper lumbar spine in keeping with DISH. IMPRESSION: 1. No noncontrast CT evidence of urinary tract calculus or hydronephrosis. 2. Redemonstrated superior endplate fracture deformity of the L4 vertebral body with substantial lytic component. This may reflect  benign fracture resorption but is highly suspicious for underlying metastasis in this patient with known malignancy. 3. Colon is fluid-filled to the rectum, consistent with diarrheal illness. 4. Coarse cirrhotic morphology of the liver. Patient's known hepatocellular carcinoma not well appreciated by noncontrast CT. 5. Cholelithiasis. 6. Status post prostatectomy. Aortic Atherosclerosis (ICD10-I70.0). Electronically Signed   By: Marolyn JONETTA Jaksch M.D.   On: 12/09/2023 17:07   CT HEAD WO CONTRAST Result Date: 12/09/2023 CLINICAL DATA:  Altered mental status EXAM: CT HEAD WITHOUT CONTRAST TECHNIQUE: Contiguous axial images were obtained from the base of the skull through the vertex without intravenous contrast. RADIATION DOSE REDUCTION: This exam was performed according to the departmental dose-optimization program which includes automated exposure control, adjustment of the mA and/or kV according to patient size and/or use of iterative reconstruction technique. COMPARISON:  10/24/2023 FINDINGS: Brain: No evidence of acute infarction, hemorrhage, hydrocephalus, extra-axial collection or mass lesion/mass effect. Periventricular white matter hypodensity. Vascular: No hyperdense vessel or unexpected calcification. Skull: Normal. Negative for fracture or focal lesion. Sinuses/Orbits: No acute finding. Other: None. IMPRESSION: No acute intracranial pathology.  Small-vessel white matter disease. Electronically Signed   By: Marolyn JONETTA Jaksch M.D.   On: 12/09/2023 17:00           LOS: 0 days   Time spent= 35 mins    Burgess JAYSON Dare, MD [facility]  If 7PM-7AM, please contact night-coverage  12/10/2023, 12:50 PM

## 2023-12-10 NOTE — H&P (Signed)
 History and Physical    Steve Jensen FMW:[id number] DOB: 10-May-1941 DOA: 12/09/2023  PCP: System, Provider Not In  Patient coming from: SNF  Chief Complaint: AMS  HPI: Steve Jensen is a 83 y.o. male with medical history significant of A-fib on Eliquis , hypertension, chronic HFpEF, OSA on CPAP, morbid obesity, type 2 diabetes, CKD stage IIIa, GERD, hepatocellular carcinoma, history of prostate cancer status post prostatectomy, falls.  Multiple recent hospital admissions.  Admitted 5/31-6/6 for altered mental status after having a fall due to weakness with reported low blood pressures.  Patient was noted not to have significant orthostatic hypotension when checked.  He was found to have an acute compression fracture of L4 for which neurosurgery had recommended LSO brace and outpatient follow-up.  Admitted again 6/15-6/24 for hypotension and C. difficile diarrhea.  Initially treated with Dificid  but had a rash so subsequently switched to oral vancomycin  after his rash improved.    Patient presents to the ED for evaluation of altered mental status/confusion and worsening renal function on labs done at his nursing facility.  Afebrile.  Labs showing WBC count 11.5, hemoglobin 12.9 (stable), platelet count 436k, bicarb 18, VBG with pH 7.30, glucose 83, BUN 51, creatinine 6.0 (previously 1.3 on labs 3 weeks ago), ammonia level normal, lactic acid 3.9> 2.7, blood cultures in process, BNP 526.  UA with signs of infection (large amount of leukocytes, >50 WBCs, few bacteria).  Urine culture in process.  Chest x-ray showing mild cardiomegaly with central pulmonary vascular congestion.  CT head showing no acute intracranial abnormality.    CT renal stone study showing no evidence of urinary tract calculus or hydronephrosis.  Redemonstrated superior endplate fracture deformity of the L4 vertebral body with substantial lytic component which may reflect benign fracture resorption but is highly suspicious for  underlying metastases given known history of malignancy.  Colon is fluid-filled to the rectum consistent with diarrheal illness.  Course cirrhotic morphology of the liver and patient's known hepatocellular carcinoma is not well-appreciated on this noncontrast CT.  There is evidence of cholelithiasis. Patient was given ceftriaxone  and 1 L IV fluid bolus in the ED.  [facility] called to admit.  Patient is currently AAO x 3 but able to give limited history.  He reports ongoing diarrhea for the past few months.  Denies vomiting or abdominal pain.  Denies fevers, cough, shortness of breath, or chest pain.  Denies any urinary symptoms.  Review of Systems:  Review of Systems  All other systems reviewed and are negative.   Past Medical History:  Diagnosis Date   Anemia    Diabetes mellitus type II, controlled (HCC)    with neuropathy   Dysrhythmia    A-Fib. cardioversion done   GERD (gastroesophageal reflux disease)    Hyperlipidemia    Hypertension    Morbid obesity (HCC)    Neuromuscular disorder (HCC)    feet   OSA (obstructive sleep apnea)    On BiPAP at 15/11cm H2O   Prostate cancer (HCC)    Shingles    on face Nov 2010   Urinary incontinence     Past Surgical History:  Procedure Laterality Date   CARDIOVERSION N/A 09/06/2013   Procedure: CARDIOVERSION;  Surgeon: Candyce GORMAN Reek, MD;  Location: [facility] ENDOSCOPY;  Service: Cardiovascular;  Laterality: N/A;   EYE SURGERY     cataract surgery bilat; surgery to correct droopy eyelids    FLEXIBLE SIGMOIDOSCOPY N/A 12/24/2021   Procedure: FLEXIBLE SIGMOIDOSCOPY;  Surgeon: Eartha Angelia Sieving, MD;  Location: [facility] ENDO SUITE;  Service: Gastroenterology;  Laterality: N/A;   IR ANGIOGRAM SELECTIVE EACH ADDITIONAL VESSEL  02/15/2018   IR ANGIOGRAM SELECTIVE EACH ADDITIONAL VESSEL  02/15/2018   IR ANGIOGRAM SELECTIVE EACH ADDITIONAL VESSEL  02/15/2018   IR ANGIOGRAM SELECTIVE EACH ADDITIONAL VESSEL  02/15/2018   IR ANGIOGRAM SELECTIVE EACH  ADDITIONAL VESSEL  02/15/2018   IR ANGIOGRAM VISCERAL SELECTIVE  02/15/2018   IR ANGIOGRAM VISCERAL SELECTIVE  02/15/2018   IR EMBO TUMOR ORGAN ISCHEMIA INFARCT INC GUIDE ROADMAPPING  02/15/2018   IR RADIOLOGIST EVAL & MGMT  01/26/2018   IR RADIOLOGIST EVAL & MGMT  03/17/2018   IR RADIOLOGIST EVAL & MGMT  06/29/2018   IR RADIOLOGIST EVAL & MGMT  07/27/2018   IR RADIOLOGIST EVAL & MGMT  10/28/2018   IR RADIOLOGIST EVAL & MGMT  12/14/2018   IR RADIOLOGIST EVAL & MGMT  03/17/2019   IR RADIOLOGIST EVAL & MGMT  07/14/2019   IR RADIOLOGIST EVAL & MGMT  12/27/2019   IR RADIOLOGIST EVAL & MGMT  08/21/2020   IR RADIOLOGIST EVAL & MGMT  02/28/2021   IR RADIOLOGIST EVAL & MGMT  06/25/2021   IR RADIOLOGIST EVAL & MGMT  10/18/2021   IR US  GUIDE VASC ACCESS RIGHT  02/15/2018   knee surgery bilat      PROSTATE SURGERY     RADIOLOGY WITH ANESTHESIA N/A 11/24/2018   Procedure: MICROWAVE THERMAL ABLATION LIVER;  Surgeon: Adele Rush, MD;  Location: [facility] ORS;  Service: Anesthesiology;  Laterality: N/A;   right shoulder rotator cuff surgery     TONSILLECTOMY       reports that he quit smoking about 48 years ago. His smoking use included cigarettes. He started smoking about 63 years ago. He has a 15 pack-year smoking history. He has never used smokeless tobacco. He reports that he does not drink alcohol and does not use drugs.  Allergies  Allergen Reactions   Lipitor [Atorvastatin] Other (See Comments)    Myalgias   Zithromax [Azithromycin] Other (See Comments)    GI intolerance   Zocor [Simvastatin] Other (See Comments)    Myalgias    Dificid  [Fidaxomicin ] Rash   Norvasc  [Amlodipine ] Swelling    Family History  Problem Relation Age of Onset   Hypertension Brother    Diabetes Brother    Cancer Brother        bladder cancer   Diabetes Brother    CVA Brother    Hypertension Brother    Prostate cancer Brother    Diabetes Brother    Hypertension Brother    Heart disease Brother        CABG   Heart attack Brother     Stroke Brother     Prior to Admission medications   Medication Sig Start Date End Date Taking? Authorizing Provider  allopurinol  (ZYLOPRIM ) 100 MG tablet Take 100 mg by mouth in the morning. 04/17/21  Yes [provider]  apixaban  (ELIQUIS ) 5 MG TABS tablet Take 5 mg by mouth 2 (two) times daily.   Yes [provider]  Cholecalciferol (VITAMIN D) 50 MCG (2000 UT) CAPS Take 2,000 Units by mouth in the morning.   Yes [provider]  ezetimibe  (ZETIA ) 10 MG tablet Take 10 mg by mouth in the morning.   Yes [provider]  ferrous sulfate 325 (65 FE) MG tablet Take 325 mg by mouth daily with breakfast.   Yes [provider]  FLUoxetine  (PROZAC ) 10 MG capsule Take 10 mg by  mouth every evening. 04/14/23  Yes [provider]  furosemide  (LASIX ) 40 MG tablet Take 40 mg by mouth in the morning.   Yes [provider]  guaiFENesin  (MUCINEX ) 600 MG 12 hr tablet Take 600 mg by mouth 2 (two) times daily as needed for cough or to loosen phlegm.   Yes [provider]  insulin  aspart (NOVOLOG ) 100 UNIT/ML injection Inject 0-10 Units into the skin 3 (three) times daily before meals. Sliding scale: if sugar is 150-200 = 2 units, if 201-250 = 4 units, if 251-300 = 6 units, if 301-350 = 8 units, if 351-400 = 10 units, give blood sugar is greater than 400 then call the MD.   Yes [provider]  Insulin  Glargine-Lixisenatide (SOLIQUA) 100-33 UNT-MCG/ML SOPN Inject 22 Units into the skin in the morning.   Yes [provider]  Magnesium  Glycinate 100 MG CAPS Take 1 capsule by mouth in the morning and at bedtime.   Yes [provider]  metFORMIN  (GLUCOPHAGE ) 1000 MG tablet Take 1,000 mg by mouth 2 (two) times daily with a meal. 06/27/14  Yes [provider]  metoprolol  succinate (TOPROL -XL) 100 MG 24 hr tablet Take 1 tablet (100 mg total) by mouth 2 (two) times daily. Take with or immediately following a meal.  10/30/23  Yes Pokhrel, Laxman, MD  Multiple Vitamins-Minerals (MULTIVITAMIN MEN 50+) TABS Take 1 tablet by mouth every evening.   Yes [provider]  Omega-3 Fatty Acids (FISH OIL) 1000 MG CAPS Take 1,000 mg by mouth 2 (two) times daily.   Yes [provider]  omeprazole  (PRILOSEC) 20 MG capsule Take 1 capsule (20 mg total) by mouth daily. Patient taking differently: Take 20 mg by mouth in the morning. 11/15/22  Yes Johnson, Clanford L, MD  potassium chloride  (KLOR-CON  M) 20 MEQ tablet Please take 40 mEq daily for 5 days and then 20 mEq daily Patient taking differently: Take 40 mEq by mouth daily. 11/16/23  Yes Krishnan, Gokul, MD  pregabalin  (LYRICA ) 50 MG capsule Take 1 capsule (50 mg total) by mouth 2 (two) times daily. 11/16/23 11/15/24 Yes Krishnan, Gokul, MD  traZODone  (DESYREL ) 50 MG tablet Take 25 mg by mouth at bedtime. 11/04/23  Yes [provider]  zinc  oxide 20 % ointment Apply 1 Application topically See admin instructions. Every shift   Yes [provider]    Physical Exam: Vitals:   12/10/23 0043 12/10/23 0145 12/10/23 0200 12/10/23 0221  BP: (!) 96/55 (!) 82/52 (!) 79/53 100/64  Pulse: 79 69 72 84  Resp: 13 13 15 17   Temp:      TempSrc:      SpO2: 95% 95% 95% 96%    Physical Exam Vitals reviewed.  Constitutional:      General: He is not in acute distress. HENT:     Head: Normocephalic and atraumatic.  Eyes:     Extraocular Movements: Extraocular movements intact.  Cardiovascular:     Rate and Rhythm: Normal rate and regular rhythm.     Pulses: Normal pulses.  Pulmonary:     Effort: Pulmonary effort is normal. No respiratory distress.     Breath sounds: No wheezing or rales.  Abdominal:     General: There is no distension.     Palpations: Abdomen is soft.     Tenderness: There is no abdominal tenderness. There is no guarding.     Comments: Hyperactive bowel sounds  Musculoskeletal:     Cervical back: Normal range of motion.  Comments: Trace bilateral pedal edema  Skin:    General: Skin is warm and dry.  Neurological:     General: No focal deficit present.     Mental Status: He is alert and oriented to person, place, and time.     Cranial Nerves: No cranial nerve deficit.     Sensory: No sensory deficit.     Motor: No weakness.     Labs on Admission: I have personally reviewed following labs and imaging studies  CBC: Recent Labs  Lab 12/09/23 1601 12/09/23 1629  WBC 11.5*  --   NEUTROABS 7.7  --   HGB 12.9* 13.3  HCT 40.5 39.0  MCV 87.3  --   PLT 436*  --    Basic Metabolic Panel: Recent Labs  Lab 12/09/23 1601 12/09/23 1629  NA 137 137  K 4.2 4.2  CL 104  --   CO2 18*  --   GLUCOSE 83  --   BUN 51*  --   CREATININE 6.07*  --   CALCIUM  9.1  --    GFR: CrCl cannot be calculated (Unknown ideal weight.). Liver Function Tests: Recent Labs  Lab 12/09/23 1601  AST 20  ALT 10  ALKPHOS 95  BILITOT 0.6  PROT 5.5*  ALBUMIN  2.8*   No results for input(s): LIPASE, AMYLASE in the last 168 hours. Recent Labs  Lab 12/09/23 1601  AMMONIA 14   Coagulation Profile: No results for input(s): INR, PROTIME in the last 168 hours. Cardiac Enzymes: No results for input(s): CKTOTAL, CKMB, CKMBINDEX, TROPONINI in the last 168 hours. BNP (last 3 results) No results for input(s): PROBNP in the last 8760 hours. HbA1C: No results for input(s): HGBA1C in the last 72 hours. CBG: Recent Labs  Lab 12/09/23 1547  GLUCAP 88   Lipid Profile: No results for input(s): CHOL, HDL, LDLCALC, TRIG, CHOLHDL, LDLDIRECT in the last 72 hours. Thyroid  Function Tests: No results for input(s): TSH, T4TOTAL, FREET4, T3FREE, THYROIDAB in the last 72 hours. Anemia Panel: No results for input(s): VITAMINB12, FOLATE, FERRITIN, TIBC, IRON, RETICCTPCT in the last 72 hours. Urine analysis:    Component Value Date/Time   COLORURINE YELLOW 12/09/2023 2230    APPEARANCEUR CLOUDY (A) 12/09/2023 2230   LABSPEC 1.009 12/09/2023 2230   PHURINE 5.0 12/09/2023 2230   GLUCOSEU NEGATIVE 12/09/2023 2230   HGBUR MODERATE (A) 12/09/2023 2230   BILIRUBINUR NEGATIVE 12/09/2023 2230   KETONESUR NEGATIVE 12/09/2023 2230   PROTEINUR 30 (A) 12/09/2023 2230   NITRITE NEGATIVE 12/09/2023 2230   LEUKOCYTESUR LARGE (A) 12/09/2023 2230    Radiological Exams on Admission: DG Chest Port 1 View Result Date: 12/09/2023 CLINICAL DATA:  Altered mental status EXAM: PORTABLE CHEST 1 VIEW COMPARISON:  Chest x-ray 11/09/2023 FINDINGS: The heart is mildly enlarged. There is central pulmonary vascular congestion. There is atelectasis or scarring in the left mid lung. There is no pleural effusion or pneumothorax. No acute fractures are seen. IMPRESSION: Mild cardiomegaly with central pulmonary vascular congestion. Electronically Signed   By: Greig Pique M.D.   On: 12/09/2023 18:00   CT Renal Stone Study Result Date: 12/09/2023 CLINICAL DATA:  Abdominal and flank pain, stone suspected history of prostate cancer, history of hepatocellular carcinoma * Tracking Code: BO * EXAM: CT ABDOMEN AND PELVIS WITHOUT CONTRAST TECHNIQUE: Multidetector CT imaging of the abdomen and pelvis was performed following the standard protocol without IV contrast. RADIATION DOSE REDUCTION: This exam was performed according to the departmental dose-optimization program which includes automated exposure control,  adjustment of the mA and/or kV according to patient size and/or use of iterative reconstruction technique. COMPARISON:  11/11/2023 FINDINGS: Lower chest: Cardiomegaly. Unchanged atelectasis or consolidation of the right lung base. Unchanged trace right pleural effusion. Small hiatal hernia. Hepatobiliary: Coarse cirrhotic morphology of the liver. Patient's known hepatocellular carcinoma not well appreciated by noncontrast CT. Tiny gallstones. Gallbladder wall thickening, or biliary dilatation. Pancreas:  Unremarkable. No pancreatic ductal dilatation or surrounding inflammatory changes. Spleen: Normal in size without significant abnormality. Adrenals/Urinary Tract: Unchanged benign right adrenal adenoma, requiring no further follow-up or characterization. Kidneys are normal, without renal calculi, solid lesion, or hydronephrosis. Bladder is unremarkable. Stomach/Bowel: Stomach is within normal limits. Appendix appears normal. Colon is fluid-filled to the rectum. Vascular/Lymphatic: Aortic atherosclerosis. No enlarged abdominal or pelvic lymph nodes. Reproductive: Status post prostatectomy. Other: No abdominal wall hernia or abnormality. No ascites. Musculoskeletal: Redemonstrated superior endplate fracture deformity of the L4 vertebral body with substantial lytic component (series 7, image 155). Unchanged sclerotic superior endplate deformity of L1. Extensive bridging osteophytosis of the thoracic and upper lumbar spine in keeping with DISH. IMPRESSION: 1. No noncontrast CT evidence of urinary tract calculus or hydronephrosis. 2. Redemonstrated superior endplate fracture deformity of the L4 vertebral body with substantial lytic component. This may reflect benign fracture resorption but is highly suspicious for underlying metastasis in this patient with known malignancy. 3. Colon is fluid-filled to the rectum, consistent with diarrheal illness. 4. Coarse cirrhotic morphology of the liver. Patient's known hepatocellular carcinoma not well appreciated by noncontrast CT. 5. Cholelithiasis. 6. Status post prostatectomy. Aortic Atherosclerosis (ICD10-I70.0). Electronically Signed   By: Marolyn JONETTA Jaksch M.D.   On: 12/09/2023 17:07   CT HEAD WO CONTRAST Result Date: 12/09/2023 CLINICAL DATA:  Altered mental status EXAM: CT HEAD WITHOUT CONTRAST TECHNIQUE: Contiguous axial images were obtained from the base of the skull through the vertex without intravenous contrast. RADIATION DOSE REDUCTION: This exam was performed  according to the departmental dose-optimization program which includes automated exposure control, adjustment of the mA and/or kV according to patient size and/or use of iterative reconstruction technique. COMPARISON:  10/24/2023 FINDINGS: Brain: No evidence of acute infarction, hemorrhage, hydrocephalus, extra-axial collection or mass lesion/mass effect. Periventricular white matter hypodensity. Vascular: No hyperdense vessel or unexpected calcification. Skull: Normal. Negative for fracture or focal lesion. Sinuses/Orbits: No acute finding. Other: None. IMPRESSION: No acute intracranial pathology.  Small-vessel white matter disease. Electronically Signed   By: Marolyn JONETTA Jaksch M.D.   On: 12/09/2023 17:00    EKG: Independently reviewed.  Rate controlled A-fib, borderline T wave abnormalities in inferior leads.  No significant change compared to previous EKGs.  Assessment and Plan  AKI on CKD stage IIIa Mild metabolic acidosis AKI could be prerenal in etiology from dehydration given ongoing diarrhea versus ?cardiorenal given elevated BNP.  CT renal stone study showing no evidence of urinary tract calculus or hydronephrosis.  BUN 51, creatinine 6.0 (previously 1.3 on labs 3 weeks ago).  Bicarb 18, VBG with normal pH.  Patient was given 1 L IV fluids in the ED.  Holding additional fluids at this time due to concern for decompensated CHF.  Repeat metabolic panel ordered.  Check urine sodium and creatinine.  Avoid nephrotoxic agents.  Consult nephrology in the morning.  Acute on chronic HFpEF Last echo done in April 2025 showing preserved EF.  However, chest x-ray showing pulmonary vascular congestion and BNP has increased from baseline.  Holding diuretics at this time given concern for AKI.  Patient is not hypoxic.  UTI UA with large amount of leukocytes, >50 WBCs, and few bacteria.  Lactic acidosis improved after IV fluids in the ED.  Labs showing borderline leukocytosis.  No fever or signs of sepsis at  this time.  Continue ceftriaxone  and follow-up urine and blood cultures.  Acute metabolic encephalopathy Likely secondary to UTI and AKI.  Ammonia level normal.  CT head showing no acute intracranial abnormality and no focal neurodeficit on exam.  History of C. difficile diarrhea Patient was recently treated with 14-day course of oral vancomycin  but continues to have ongoing diarrhea.  Placed on enteric precautions and repeat C. difficile testing ordered.  History of hepatocellular carcinoma During hospital admission 5/31-6/6, patient was found to have an acute compression fracture of L4 for which neurosurgery had recommended LSO brace and outpatient follow-up.  Repeat CT done at this time redemonstrated superior endplate fracture deformity of the L4 vertebral body with substantial lytic component which may reflect benign fracture resorption but is highly suspicious for underlying metastases given known history of malignancy.  Patient is not endorsing back pain at this time.  He will need outpatient oncology and neurosurgery follow-up.  A-fib Currently rate controlled.  Continue metoprolol  and Eliquis .  OSA Continue nightly CPAP.  Type 2 diabetes Last A1c 7.5 on 09/04/2023.  CBG in the low 80s and holding long-acting insulin  at this time.  Placed on sensitive sliding scale insulin  AC[facility].  Resume long-acting insulin  in the morning if glucose remains stable.  GERD Continue PPI.  Gout Continue allopurinol .  Hyperlipidemia Continue Zetia .  Mood disorder/insomnia Continue home medications.  Chronic pain/neuropathy Continue Lyrica .  DVT prophylaxis: Eliquis  Code Status: DNR/DNI (discussed with the patient) Family Communication: No family available at this time. Level of care: Progressive Care Unit Admission status: It is my clinical opinion that admission to INPATIENT is reasonable and necessary because of the expectation that this patient will require hospital care that crosses at  least 2 midnights to treat this condition based on the medical complexity of the problems presented.  Given the aforementioned information, the predictability of an adverse outcome is felt to be significant.  Editha Ram MD [facility]  If 7PM-7AM, please contact night-coverage [email or web address]  12/10/2023, 2:44 AM

## 2023-12-10 NOTE — Hospital Course (Addendum)
 Brief Narrative:  83 y.o. male with medical history significant of A-fib on Eliquis , hypertension, chronic HFpEF, OSA on CPAP, morbid obesity, type 2 diabetes, CKD stage IIIa, GERD, hepatocellular carcinoma, history of prostate cancer status post prostatectomy, falls.  Multiple recent hospital admissions.  Admitted 5/31-6/6 for altered mental status after having a fall due to weakness with reported low blood pressures.  Patient was noted not to have significant orthostatic hypotension when checked.  He was found to have an acute compression fracture of L4 for which neurosurgery had recommended LSO brace and outpatient follow-up.  Admitted again 6/15-6/24 for hypotension and C. difficile diarrhea.  Initially treated with Dificid  but had a rash so subsequently switched to oral vancomycin  after his rash improved.     Patient presents to the ED for evaluation of altered mental status/confusion and worsening renal function on labs done at his nursing facility.    During this admission found to have concerns of AKI on CKD 3A, decompensated heart failure, C. difficile and urinary tract infection.   Assessment & Plan:  Principal Problem:   AKI (acute kidney injury) (HCC) Active Problems:   Acute metabolic acidosis   C. difficile diarrhea   Acute metabolic encephalopathy   Type 2 diabetes mellitus (HCC)   CHF exacerbation (HCC)   UTI (urinary tract infection)      AKI on CKD stage IIIa Mild metabolic acidosis Baseline creatinine 1.3, admission creatinine 6.07.  Could be prerenal in nature but does have elevated BNP.  Although chest x-ray shows some vascular congestion, he is on room air.  Will give gentle hydration.  Avoid any nephrotoxic drugs. -CT renal study does not show any obvious evidence of obstruction/renal stone - Monitor urine output and creatinine -Placed Foley.    Acute on chronic HFpEF Last echo done in April 2025 showing preserved EF.  However, chest x-ray showing pulmonary  vascular congestion and BNP has increased from baseline.  Holding diuretics at this time given concern for AKI.  Patient is not hypoxic. Will repeat Echo   UTI UA suggestive of urinary tract infection.  Urine cultures not sent, will order Empiric IV Rocephin    Acute metabolic encephalopathy Likely secondary to UTI and AKI.  CT head is negative.  Ammonia is normal.  Check TSH.   History of C. difficile diarrhea Recently discharged on 11/13/2023 with 14 days of p.o. vancomycin .  Did not tolerate Dificid  as patient developed rash. Repeat C diff is neg and GI panel - pending    History of hepatocellular carcinoma During hospital admission 5/31-6/6, patient was found to have an acute compression fracture of L4 for which neurosurgery had recommended LSO brace and outpatient follow-up.  Repeat CT done at this time redemonstrated superior endplate fracture deformity of the L4 vertebral body with substantial lytic component which may reflect benign fracture resorption but is highly suspicious for underlying metastases given known history of malignancy.  Patient is not endorsing back pain at this time.  He will need outpatient oncology and neurosurgery follow-up.   A-fib Currently rate controlled.  Continue metoprolol  and Eliquis .   OSA Continue nightly CPAP.   Type 2 diabetes Sliding scale and Accu-Cheks   GERD Continue PPI.   Gout Hold allopurinol    Hyperlipidemia Continue Zetia .   Mood disorder/insomnia Continue home medications.   Chronic pain/neuropathy Continue Lyrica .   DVT prophylaxis: Eliquis  Code Status: DNR/DNI (discussed with the patient) Family Communication: No family available at this time. Status is: Inpatient Remains inpatient appropriate because: Cont hospital stay  for AKI    Subjective:  Doing ok Still having diarrhea.  Feels ok.   Examination:  General exam: Appears calm and comfortable  Respiratory system: Clear to auscultation. Respiratory effort  normal. Cardiovascular system: S1 & S2 heard, RRR. No JVD, murmurs, rubs, gallops or clicks. No pedal edema. Gastrointestinal system: Abdomen is nondistended, soft and nontender. No organomegaly or masses felt. Normal bowel sounds heard. Central nervous system: Alert and oriented. No focal neurological deficits. Extremities: Symmetric 5 x 5 power. Skin: No rashes, lesions or ulcers Psychiatry: Judgement and insight appear normal. Mood & affect appropriate.

## 2023-12-10 NOTE — Progress Notes (Signed)
 Patient does not wish to wear hospital CPAP machine and prefers his machine from home. RN attempting to retrieve CPAP left by patient in ER.

## 2023-12-11 ENCOUNTER — Inpatient Hospital Stay (HOSPITAL_COMMUNITY)

## 2023-12-11 DIAGNOSIS — A0472 Enterocolitis due to Clostridium difficile, not specified as recurrent: Secondary | ICD-10-CM | POA: Diagnosis not present

## 2023-12-11 DIAGNOSIS — E8721 Acute metabolic acidosis: Secondary | ICD-10-CM | POA: Diagnosis not present

## 2023-12-11 DIAGNOSIS — I428 Other cardiomyopathies: Secondary | ICD-10-CM | POA: Diagnosis not present

## 2023-12-11 DIAGNOSIS — N179 Acute kidney failure, unspecified: Secondary | ICD-10-CM | POA: Diagnosis not present

## 2023-12-11 DIAGNOSIS — G9341 Metabolic encephalopathy: Secondary | ICD-10-CM | POA: Diagnosis not present

## 2023-12-11 LAB — ECHOCARDIOGRAM COMPLETE
AR max vel: 3.58 cm2
AV Area VTI: 3.43 cm2
AV Area mean vel: 3.37 cm2
AV Mean grad: 3 mmHg
AV Peak grad: 5.2 mmHg
Ao pk vel: 1.14 m/s
Area-P 1/2: 5.02 cm2
MV M vel: 3.91 m/s
MV Peak grad: 61.2 mmHg
S' Lateral: 3.55 cm

## 2023-12-11 LAB — BASIC METABOLIC PANEL WITH GFR
Anion gap: 15 (ref 5–15)
BUN: 51 mg/dL — ABNORMAL HIGH (ref 8–23)
CO2: 12 mmol/L — ABNORMAL LOW (ref 22–32)
Calcium: 7.6 mg/dL — ABNORMAL LOW (ref 8.9–10.3)
Chloride: 112 mmol/L — ABNORMAL HIGH (ref 98–111)
Creatinine, Ser: 6.06 mg/dL — ABNORMAL HIGH (ref 0.61–1.24)
GFR, Estimated: 9 mL/min — ABNORMAL LOW (ref >60)
Glucose, Bld: 92 mg/dL (ref 70–99)
Potassium: 3.5 mmol/L (ref 3.5–5.1)
Sodium: 139 mmol/L (ref 135–145)

## 2023-12-11 LAB — GLUCOSE, CAPILLARY
Glucose-Capillary: 99 mg/dL (ref 70–99)
Glucose-Capillary: 130 mg/dL — ABNORMAL HIGH (ref 70–99)
Glucose-Capillary: 176 mg/dL — ABNORMAL HIGH (ref 70–99)
Glucose-Capillary: 116 mg/dL — ABNORMAL HIGH (ref 70–99)

## 2023-12-11 LAB — MAGNESIUM: Magnesium: 1.7 mg/dL (ref 1.7–2.4)

## 2023-12-11 LAB — PHOSPHORUS: Phosphorus: 4.8 mg/dL — ABNORMAL HIGH (ref 2.5–4.6)

## 2023-12-11 MED ORDER — SODIUM BICARBONATE 8.4 % IV SOLN
INTRAVENOUS | Status: DC
  Filled 2023-12-11: qty 1000

## 2023-12-11 MED ORDER — STERILE WATER FOR INJECTION IV SOLN
INTRAVENOUS | Status: DC
  Filled 2023-12-11: qty 1000
  Filled 2023-12-11: qty 150

## 2023-12-11 MED ORDER — LOPERAMIDE HCL 2 MG PO CAPS
2.0000 mg | ORAL_CAPSULE | ORAL | Status: DC | PRN
  Administered 2023-12-11 – 2023-12-12 (×4): 2 mg via ORAL
  Filled 2023-12-11 (×2): qty 1

## 2023-12-11 NOTE — Progress Notes (Addendum)
 PROGRESS NOTE        PATIENT DETAILS Name: Steve Jensen Age: 83 y.o. Sex: male Date of Birth: 11/06/1940 Admit Date: 12/09/2023 Admitting Physician Editha Ram, MD ERE:[email or web address], Provider Not In  Brief Summary: Patient is a 83 y.o.  male with history of A-fib, HTN, chronic HFrEF, PAF, CKD stage IIIa, hepatocellular CA, prostate CA, OSA on CPAP-presented to the hospital with confusion-found to have AKI.  Significant events: 7/17>> admit to [facility].  Significant studies: 7/16>>CT Head:no acute intracranial pathology 7/16>>CT Renal Stone study: No hydronephrosis. 7/16>> CXR: No PNA  Significant microbiology data: 7/16>> urine culture: Multiple species. 7/16>> blood culture: No growth 7/17>> GI pathogen panel: Negative 7/17>> stool C. difficile PCR: Negative  Procedures: None  Consults: Renal  Subjective: No major issues overnight-acknowledges having diarrhea for the past several months.  Objective: Vitals: Blood pressure (!) 122/90, pulse 97, temperature (!) 97.3 F (36.3 C), temperature source Oral, resp. rate 14, SpO2 93%.   Exam: Gen Exam:Alert awake-not in any distress HEENT:atraumatic, normocephalic Chest: B/L clear to auscultation anteriorly CVS:S1S2 regular Abdomen:soft non tender, non distended Extremities:++ edema Neurology: Non focal Skin: no rash  Pertinent Labs/Radiology:    Latest Ref Rng & Units 12/10/2023    5:25 AM 12/09/2023    4:29 PM 12/09/2023    4:01 PM  CBC  WBC 4.0 - 10.5 K/uL 10.1   11.5   Hemoglobin 13.0 - 17.0 g/dL 87.6  86.6  87.0   Hematocrit 39.0 - 52.0 % 38.4  39.0  40.5   Platelets 150 - 400 K/uL 388   436     Lab Results  Component Value Date   NA 139 12/11/2023   K 3.5 12/11/2023   CL 112 (H) 12/11/2023   CO2 12 (L) 12/11/2023      Assessment/Plan: AKI on CKD stage IIIa AKI likely hemodynamically mediated in the setting of diarrhea/diuretic/metformin  use. Renal function gradually  improving with supportive care. Avoid nephrotoxic agents Follow electrolytes  Non-anion gap metabolic acidosis Due to combination of diarrhea and AKI Switch IVF to IVF with bicarb.  Acute metabolic encephalopathy Secondary to AKI Improved  Chronic HFpEF Relatively stable-has chronic leg edema-lying flat No evidence of decompensated heart failure-acute HFpEF ruled out. Cautiously continue with IVF.  Chronic atrial fibrillation Rate controlled Telemetry monitoring Metoprolol /Eliquis .  ?  UTI Has no symptoms-2 samples of UA with discordant results Since not symptomatic-has recent diagnosis of C. difficile-stop antibiotics and monitor.  Chronic diarrhea As needed Imodium  C. difficile studies/GI pathogen panel negative. May need GI evaluation at some point in time.  Recent history of C. difficile diarrhea Completed course of oral vancomycin -repeat stool studies negative.  HLD Zetia   DM-2 (A1c 7.5 on 4/11) CBG stable SSI for now.   Recent Labs    12/10/23 1658 12/10/23 2135 12/11/23 0800  GLUCAP 109* 108* 99    Peripheral neuropathy Lyrica .  History of hepatocellular carcinoma History of prostate cancer-s/p prostatectomy Outpatient oncology/urology follow-up.  Recent history of L4 vertebral body compression fracture Repeat CT this admit-with lytic component-with concern for metastatic disease. Continue T SLO brace-outpatient neurosurgical follow-up  Mood disorder Relatively stable Continue Prozac /trazodone .  OSA CPAP nightly  Class 3 Obesity: Estimated body mass index is 40.17 kg/m as calculated from the following:   Height as of 11/10/23: 5' 9 (1.753 m).   Weight as of 11/17/23: 123.4 kg.  Code status:   Code Status: Limited: Do not attempt resuscitation (DNR) -DNR-LIMITED -Do Not Intubate/DNI    DVT Prophylaxis: apixaban  (ELIQUIS ) tablet 2.5 mg Start: 12/10/23 2200 apixaban  (ELIQUIS ) tablet 2.5 mg    Family  Communication:Daughter-[phone] left VM 7/18   Disposition Plan: Status is: Inpatient Remains inpatient appropriate because: Severity of illness   Planned Discharge Destination:Skilled nursing facility   Diet: Diet Order             Diet heart healthy/carb modified Room service appropriate? Yes; Fluid consistency: Thin  Diet effective now                     Antimicrobial agents: Anti-infectives (From admission, onward)    Start     Dose/Rate Route Frequency Ordered Stop   12/10/23 2300  cefTRIAXone  (ROCEPHIN ) 1 g in sodium chloride  0.9 % 100 mL IVPB        1 g 200 mL/hr over 30 Minutes Intravenous Every 24 hours 12/10/23 0353     12/09/23 2300  cefTRIAXone  (ROCEPHIN ) 2 g in sodium chloride  0.9 % 100 mL IVPB        2 g 200 mL/hr over 30 Minutes Intravenous  Once 12/09/23 2249 12/10/23 0117        MEDICATIONS: Scheduled Meds:  apixaban   2.5 mg Oral BID   Chlorhexidine  Gluconate Cloth  6 each Topical Daily   ezetimibe   10 mg Oral q AM   FLUoxetine   10 mg Oral QPM   insulin  aspart  0-5 Units Subcutaneous QHS   insulin  aspart  0-9 Units Subcutaneous TID WC   metoprolol  succinate  100 mg Oral BID   pantoprazole   40 mg Oral Daily   pregabalin   50 mg Oral BID   traZODone   25 mg Oral QHS   Continuous Infusions:  sodium chloride  75 mL/hr at 12/11/23 0309   cefTRIAXone  (ROCEPHIN )  IV 1 g (12/10/23 2321)   PRN Meds:.acetaminophen  **OR** acetaminophen , guaiFENesin , hydrALAZINE , ipratropium-albuterol , ondansetron  (ZOFRAN ) IV, senna-docusate   I have personally reviewed following labs and imaging studies  LABORATORY DATA: CBC: Recent Labs  Lab 12/09/23 1601 12/09/23 1629 12/10/23 0525  WBC 11.5*  --  10.1  NEUTROABS 7.7  --   --   HGB 12.9* 13.3 12.3*  HCT 40.5 39.0 38.4*  MCV 87.3  --  87.3  PLT 436*  --  388    Basic Metabolic Panel: Recent Labs  Lab 12/09/23 1601 12/09/23 1629 12/10/23 0525 12/10/23 1538 12/11/23 0543  NA 137 137  135 135 139  K 4.2 4.2 3.8 3.6 3.5  CL 104  --  106 105 112*  CO2 18*  --  16* 17* 12*  GLUCOSE 83  --  93 113* 92  BUN 51*  --  52* 54* 51*  CREATININE 6.07*  --  6.11* 6.41* 6.06*  CALCIUM  9.1  --  8.6* 8.6* 7.6*  MG  --   --   --   --  1.7  PHOS  --   --   --   --  4.8*    GFR: CrCl cannot be calculated (Unknown ideal weight.).  Liver Function Tests: Recent Labs  Lab 12/09/23 1601  AST 20  ALT 10  ALKPHOS 95  BILITOT 0.6  PROT 5.5*  ALBUMIN  2.8*   No results for input(s): LIPASE, AMYLASE in the last 168 hours. Recent Labs  Lab 12/09/23 1601  AMMONIA 14    Coagulation Profile: No results for input(s): INR, PROTIME in  the last 168 hours.  Cardiac Enzymes: No results for input(s): CKTOTAL, CKMB, CKMBINDEX, TROPONINI in the last 168 hours.  BNP (last 3 results) No results for input(s): PROBNP in the last 8760 hours.  Lipid Profile: No results for input(s): CHOL, HDL, LDLCALC, TRIG, CHOLHDL, LDLDIRECT in the last 72 hours.  Thyroid  Function Tests: No results for input(s): TSH, T4TOTAL, FREET4, T3FREE, THYROIDAB in the last 72 hours.  Anemia Panel: No results for input(s): VITAMINB12, FOLATE, FERRITIN, TIBC, IRON, RETICCTPCT in the last 72 hours.  Urine analysis:    Component Value Date/Time   COLORURINE YELLOW 12/10/2023 0854   APPEARANCEUR CLOUDY (A) 12/10/2023 0854   LABSPEC 1.020 12/10/2023 0854   PHURINE 5.5 12/10/2023 0854   GLUCOSEU NEGATIVE 12/10/2023 0854   HGBUR LARGE (A) 12/10/2023 0854   BILIRUBINUR NEGATIVE 12/10/2023 0854   KETONESUR NEGATIVE 12/10/2023 0854   PROTEINUR 30 (A) 12/10/2023 0854   NITRITE NEGATIVE 12/10/2023 0854   LEUKOCYTESUR SMALL (A) 12/10/2023 0854    Sepsis Labs: Lactic Acid, Venous    Component Value Date/Time   LATICACIDVEN 2.7 (HH) 12/09/2023 1955    MICROBIOLOGY: Recent Results (from the past 240 hours)  Blood culture (routine x 2)     Status: None  (Preliminary result)   Collection Time: 12/09/23  4:20 PM   Specimen: BLOOD LEFT FOREARM  Result Value Ref Range Status   Specimen Description BLOOD LEFT FOREARM  Final   Special Requests   Final    BOTTLES DRAWN AEROBIC AND ANAEROBIC Blood Culture adequate volume   Culture   Final    NO GROWTH 2 DAYS Performed at [facility], [address], [place], [place] [address]    Report Status PENDING  Incomplete  Blood culture (routine x 2)     Status: None (Preliminary result)   Collection Time: 12/09/23  4:44 PM   Specimen: BLOOD RIGHT ARM  Result Value Ref Range Status   Specimen Description BLOOD RIGHT ARM  Final   Special Requests   Final    BOTTLES DRAWN AEROBIC AND ANAEROBIC Blood Culture adequate volume   Culture   Final    NO GROWTH 2 DAYS Performed at [facility], [address], [place], [place] [address]    Report Status PENDING  Incomplete  Urine Culture     Status: Abnormal   Collection Time: 12/09/23 10:30 PM   Specimen: Urine, Random  Result Value Ref Range Status   Specimen Description URINE, RANDOM  Final   Special Requests   Final    NONE Reflexed from [id number] Performed at [facility], [address], [place], [place] [address]    Culture MULTIPLE SPECIES PRESENT, SUGGEST RECOLLECTION (A)  Final   Report Status 12/10/2023 FINAL  Final  C Difficile Quick Screen w PCR reflex     Status: None   Collection Time: 12/10/23  9:50 AM   Specimen: STOOL  Result Value Ref Range Status   C Diff antigen NEGATIVE NEGATIVE Final   C Diff toxin NEGATIVE NEGATIVE Final   C Diff interpretation No C. difficile detected.  Final    Comment: Performed at [facility], [address], [place], [place] [address]  Gastrointestinal Panel by PCR , Stool     Status: None   Collection Time: 12/10/23  1:34 PM   Specimen: Stool  Result Value Ref Range Status   Campylobacter species NOT DETECTED NOT DETECTED Final   Plesimonas shigelloides NOT DETECTED NOT  DETECTED  Final   Salmonella species NOT DETECTED NOT DETECTED Final   Yersinia enterocolitica NOT DETECTED NOT DETECTED Final   Vibrio species NOT DETECTED NOT DETECTED Final   Vibrio cholerae NOT DETECTED NOT DETECTED Final   Enteroaggregative E coli (EAEC) NOT DETECTED NOT DETECTED Final   Enteropathogenic E coli (EPEC) NOT DETECTED NOT DETECTED Final   Enterotoxigenic E coli (ETEC) NOT DETECTED NOT DETECTED Final   Shiga like toxin producing E coli (STEC) NOT DETECTED NOT DETECTED Final   Shigella/Enteroinvasive E coli (EIEC) NOT DETECTED NOT DETECTED Final   Cryptosporidium NOT DETECTED NOT DETECTED Final   Cyclospora cayetanensis NOT DETECTED NOT DETECTED Final   Entamoeba histolytica NOT DETECTED NOT DETECTED Final   Giardia lamblia NOT DETECTED NOT DETECTED Final   Adenovirus F40/41 NOT DETECTED NOT DETECTED Final   Astrovirus NOT DETECTED NOT DETECTED Final   Norovirus GI/GII NOT DETECTED NOT DETECTED Final   Rotavirus A NOT DETECTED NOT DETECTED Final   Sapovirus (I, II, IV, and V) NOT DETECTED NOT DETECTED Final    Comment: Performed at [facility], [address] [place], [place] [address]  MRSA Next Gen by PCR, Nasal     Status: None   Collection Time: 12/10/23  5:13 PM   Specimen: Nasal Mucosa; Nasal Swab  Result Value Ref Range Status   MRSA by PCR Next Gen NOT DETECTED NOT DETECTED Final    Comment: (NOTE) The GeneXpert MRSA Assay (FDA approved for NASAL specimens only), is one component of a comprehensive MRSA colonization surveillance program. It is not intended to diagnose MRSA infection nor to guide or monitor treatment for MRSA infections. Test performance is not FDA approved in patients less than 36 years old. Performed at [facility], [address], [place], [place] [address]     RADIOLOGY STUDIES/RESULTS: DG Chest Port 1 View Result Date: 12/09/2023 CLINICAL DATA:  Altered mental status EXAM: PORTABLE CHEST 1 VIEW COMPARISON:  Chest  x-ray 11/09/2023 FINDINGS: The heart is mildly enlarged. There is central pulmonary vascular congestion. There is atelectasis or scarring in the left mid lung. There is no pleural effusion or pneumothorax. No acute fractures are seen. IMPRESSION: Mild cardiomegaly with central pulmonary vascular congestion. Electronically Signed   By: Greig Pique M.D.   On: 12/09/2023 18:00   CT Renal Stone Study Result Date: 12/09/2023 CLINICAL DATA:  Abdominal and flank pain, stone suspected history of prostate cancer, history of hepatocellular carcinoma * Tracking Code: BO * EXAM: CT ABDOMEN AND PELVIS WITHOUT CONTRAST TECHNIQUE: Multidetector CT imaging of the abdomen and pelvis was performed following the standard protocol without IV contrast. RADIATION DOSE REDUCTION: This exam was performed according to the departmental dose-optimization program which includes automated exposure control, adjustment of the mA and/or kV according to patient size and/or use of iterative reconstruction technique. COMPARISON:  11/11/2023 FINDINGS: Lower chest: Cardiomegaly. Unchanged atelectasis or consolidation of the right lung base. Unchanged trace right pleural effusion. Small hiatal hernia. Hepatobiliary: Coarse cirrhotic morphology of the liver. Patient's known hepatocellular carcinoma not well appreciated by noncontrast CT. Tiny gallstones. Gallbladder wall thickening, or biliary dilatation. Pancreas: Unremarkable. No pancreatic ductal dilatation or surrounding inflammatory changes. Spleen: Normal in size without significant abnormality. Adrenals/Urinary Tract: Unchanged benign right adrenal adenoma, requiring no further follow-up or characterization. Kidneys are normal, without renal calculi, solid lesion, or hydronephrosis. Bladder is unremarkable. Stomach/Bowel: Stomach is within normal limits. Appendix appears normal. Colon is fluid-filled to the rectum. Vascular/Lymphatic: Aortic atherosclerosis. No enlarged abdominal or pelvic  lymph nodes. Reproductive: Status post prostatectomy. Other: No abdominal wall hernia or abnormality. No ascites. Musculoskeletal: Redemonstrated superior endplate fracture deformity of the L4 vertebral body with substantial lytic component (series 7, image 155). Unchanged sclerotic superior endplate deformity of L1. Extensive bridging osteophytosis of the thoracic and upper lumbar spine in keeping with DISH. IMPRESSION: 1. No noncontrast CT evidence of urinary tract calculus or hydronephrosis. 2. Redemonstrated superior endplate fracture deformity of the L4 vertebral body with substantial lytic component. This may reflect benign fracture resorption but is highly suspicious for underlying metastasis in this patient with known malignancy. 3. Colon is fluid-filled to the rectum, consistent with diarrheal illness. 4. Coarse cirrhotic morphology of the liver. Patient's known hepatocellular carcinoma not well appreciated by noncontrast CT. 5. Cholelithiasis. 6. Status post prostatectomy. Aortic Atherosclerosis (ICD10-I70.0). Electronically Signed   By: Marolyn JONETTA Jaksch M.D.   On: 12/09/2023 17:07   CT HEAD WO CONTRAST Result Date: 12/09/2023 CLINICAL DATA:  Altered mental status EXAM: CT HEAD WITHOUT CONTRAST TECHNIQUE: Contiguous axial images were obtained from the base of the skull through the vertex without intravenous contrast. RADIATION DOSE REDUCTION: This exam was performed according to the departmental dose-optimization program which includes automated exposure control, adjustment of the mA and/or kV according to patient size and/or use of iterative reconstruction technique. COMPARISON:  10/24/2023 FINDINGS: Brain: No evidence of acute infarction, hemorrhage, hydrocephalus, extra-axial collection or mass lesion/mass effect. Periventricular white matter hypodensity. Vascular: No hyperdense vessel or unexpected calcification. Skull: Normal. Negative for fracture or focal lesion. Sinuses/Orbits: No acute finding.  Other: None. IMPRESSION: No acute intracranial pathology.  Small-vessel white matter disease. Electronically Signed   By: Marolyn JONETTA Jaksch M.D.   On: 12/09/2023 17:00     LOS: 1 day   Donalda Applebaum, MD  [facility]    To contact the attending provider between 7A-7P or the covering provider during after hours 7P-7A, please log into the web site [email or web address] and access using universal George password for that web site. If you do not have the password, please call the hospital operator.  12/11/2023, 9:55 AM

## 2023-12-11 NOTE — Progress Notes (Signed)
 Heart Failure Navigator Progress Note  Assessed for Heart & Vascular TOC clinic readiness.  Patient does not meet criteria due to Last EF 55-60%, per MD note . No evidence of decompensated heart failure-acute HFpEF ruled out. No HF TOC.   Navigator will sign off at this time.   Stephane Haddock, BSN, Scientist, clinical (histocompatibility and immunogenetics) Only

## 2023-12-11 NOTE — Evaluation (Signed)
 Occupational Therapy Evaluation Patient Details Name: Steve Jensen MRN: [id number] DOB: 1940-07-22 Today's Date: 12/11/2023   History of Present Illness   Pt is an 83 year old man admitted from [facility] where he is a resident on 12/09/23 with AMS and acute renal failure. PMH: HTN, DM2, OSA, morbid obesity, PAF, CHF, hepatocellular carcinoma, C-diff with ongoing diarrhea, CKD, L4 compression fracture (09/2023) managed with LSO, prostate cancer.     Clinical Impressions Per daughter in law, staff at the SNF use a hoyer lift to assist pt OOB. He can typically self feed and groom, he is otherwise dependent in ADLs and IADLs. Pt has longstanding memory deficits. His daughter in law reports his cognition is not at his baseline, RN notified. Pt presents with generalized weakness requiring +2 assist for bed mobility. He demonstrates poor sitting balance at EOB with posterior and L lateral lean. Pt is at his functional baseline. No further acute OT needs.     If plan is discharge home, recommend the following:   Two people to help with walking and/or transfers;Two people to help with bathing/dressing/bathroom;Assistance with cooking/housework;Assistance with feeding;Direct supervision/assist for medications management;Direct supervision/assist for financial management;Assist for transportation;Help with stairs or ramp for entrance     Functional Status Assessment   Patient has not had a recent decline in their functional status     Equipment Recommendations   None recommended by OT     Recommendations for Other Services         Precautions/Restrictions   Precautions Precautions: Fall Restrictions Weight Bearing Restrictions Per Provider Order: No     Mobility Bed Mobility Overal bed mobility: Needs Assistance Bed Mobility: Supine to Sit, Sit to Supine     Supine to sit: +2 for physical assistance, Chaka assist Sit to supine: +2 for physical assistance, Total  assist   General bed mobility comments: pt able to advance LEs over EOB with min assist, Carles +2 to raise trunk and use of bed pad for hips to EOB, returned to supine with assist of trunk and LEs    Transfers                   General transfer comment: deferred, pt requires lift equipment for OOB, RN notified      Balance Overall balance assessment: Needs assistance Sitting-balance support: Feet supported, Bilateral upper extremity supported Sitting balance-Leahy Scale: Poor   Postural control: Posterior lean, Left lateral lean                                 ADL either performed or assessed with clinical judgement   ADL Overall ADL's : At baseline                                             Vision Baseline Vision/History: 1 Wears glasses Ability to See in Adequate Light: 0 Adequate Patient Visual Report: No change from baseline       Perception         Praxis         Pertinent Vitals/Pain Pain Assessment Pain Assessment: Faces Faces Pain Scale: No hurt     Extremity/Trunk Assessment Upper Extremity Assessment Upper Extremity Assessment: Overall WFL for tasks assessed   Lower Extremity Assessment Lower Extremity Assessment: Defer to PT evaluation  Cervical / Trunk Assessment Cervical / Trunk Assessment: Other exceptions (weakness, obesity)   Communication Communication Communication: No apparent difficulties   Cognition Arousal: Alert Behavior During Therapy: Flat affect Cognition: Cognition impaired   Orientation impairments: Place, Situation Awareness: Intellectual awareness impaired, Online awareness impaired Memory impairment (select all impairments): Short-term memory, Declarative long-term memory, Working memory Attention impairment (select first level of impairment): Sustained attention Executive functioning impairment (select all impairments): Initiation, Sequencing OT - Cognition Comments: problem  solved opening his drink with his fork, does not ask for assistance when needed                 Following commands: Impaired Following commands impaired: Follows one step commands with increased time     Cueing  General Comments   Cueing Techniques: Verbal cues   VSS   Exercises     Shoulder Instructions      Home Living Family/patient expects to be discharged to:: Skilled nursing facility                                        Prior Functioning/Environment Prior Level of Function : Needs assist             Mobility Comments: per daughter in law, pt requires hoyer lift for transfers ADLs Comments: per daughter in law, pt self feeds and participates in grooming, he is otherwise dependent in ADLs and IADLs    OT Problem List:     OT Treatment/Interventions:        OT Goals(Current goals can be found in the care plan section)   Acute Rehab OT Goals OT Goal Formulation: With family   OT Frequency:       Co-evaluation PT/OT/SLP Co-Evaluation/Treatment: Yes Reason for Co-Treatment: For patient/therapist safety   OT goals addressed during session: ADL's and self-care;Strengthening/ROM      AM-PAC OT 6 Clicks Daily Activity     Outcome Measure Help from another person eating meals?: A Little Help from another person taking care of personal grooming?: A Little Help from another person toileting, which includes using toliet, bedpan, or urinal?: Total Help from another person bathing (including washing, rinsing, drying)?: Total Help from another person to put on and taking off regular upper body clothing?: A Lot Help from another person to put on and taking off regular lower body clothing?: Total 6 Click Score: 11   End of Session Nurse Communication: Other (comment);Mobility status (daughter in law would like update)  Activity Tolerance: Patient tolerated treatment well Patient left: in bed;with call bell/phone within reach;with bed  alarm set;with family/visitor present  OT Visit Diagnosis: Muscle weakness (generalized) (M62.81);Other symptoms and signs involving cognitive function                Time: 1352-1436 OT Time Calculation (min): 44 min Charges:  OT General Charges $OT Visit: 1 Visit OT Evaluation $OT Eval Moderate Complexity: 1 Mod OT Treatments $Therapeutic Activity: 8-22 mins  Mliss HERO, OTR/L Acute Rehabilitation Services Office: [phone]   Kennth Mliss Helling 12/11/2023, 2:49 PM

## 2023-12-11 NOTE — Progress Notes (Signed)
 [facility] NEPHROLOGY PROGRESS NOTE  Assessment/ Plan: Pt is a 83 y.o. yo male  with a past medical history of hypertension, DM2, HFpEF, OSA, obesity, hepatocellular carcinoma, history of prostate cancer status post prostatectomy, A-fib on Eliquis , GERD who presents to [facility] with altered mental status and worsening renal function which was done at his nursing facility.   # AKI on CKD3a -baseline seems to be around 1.2-1.3. Suspecting AKI is secondary to prolonged pre-renal injury. No obstruction on CT - Treated with gentle IV fluid with creatinine level trending down.  Patient is nonoliguric.  Switching IV fluid to sodium bicarbonate  to help with metabolic acidosis.  Continue strict Input and Output monitoring. Will monitor the patient closely with you and intervene or adjust therapy as indicated by changes in clinical status/labs    # Acute on chronic HFpEF exacerbation -BNP elevated. CXR with vascular congestion. I believe BNP can also be elevated in cases of cirrhosis and renal insufficiency -difficult to fully assess total body volume status given his body habitus. Echo pending, can help us  elucidate volume status further. Of note, he is receiving gentle fluids. Not hypoxic, on RA. If any concerns with his resp status then recommend stopping fluids and giving high dose lasix    # Metabolic acidosis -likely secondary to chronic diarrhea, lactic acidosis, AKI -starting bicarb IVF   #H/o Cdiff  Diarrhea -repeat cdiff negative here. Per primary  Subjective:  Seen and examined.  Urine output around 1.2 L.  Denies nausea, vomiting, chest pain or shortness of breath.  No new pain.  Discussed with primary team. Objective Vital signs in last 24 hours: Vitals:   12/10/23 1359 12/10/23 2000 12/11/23 0000 12/11/23 0400  BP: 119/63 102/65 101/62 (!) 122/90  Pulse: 75 80 85 97  Resp: 16  15 14   Temp: (!) 97.3 F (36.3 C)     TempSrc: Oral     SpO2: 93% 93% 93% 93%   Weight  change:   Intake/Output Summary (Last 24 hours) at 12/11/2023 1309 Last data filed at 12/11/2023 0830 Gross per 24 hour  Intake 660.25 ml  Output 1275 ml  Net -614.75 ml       Labs: RENAL PANEL Recent Labs  Lab 12/09/23 1601 12/09/23 1629 12/10/23 0525 12/10/23 1538 12/11/23 0543  NA 137 137 135 135 139  K 4.2 4.2 3.8 3.6 3.5  CL 104  --  106 105 112*  CO2 18*  --  16* 17* 12*  GLUCOSE 83  --  93 113* 92  BUN 51*  --  52* 54* 51*  CREATININE 6.07*  --  6.11* 6.41* 6.06*  CALCIUM  9.1  --  8.6* 8.6* 7.6*  MG  --   --   --   --  1.7  PHOS  --   --   --   --  4.8*  ALBUMIN  2.8*  --   --   --   --     Liver Function Tests: Recent Labs  Lab 12/09/23 1601  AST 20  ALT 10  ALKPHOS 95  BILITOT 0.6  PROT 5.5*  ALBUMIN  2.8*   No results for input(s): LIPASE, AMYLASE in the last 168 hours. Recent Labs  Lab 12/09/23 1601  AMMONIA 14   CBC: Recent Labs    11/15/23 0824 11/16/23 0601 12/09/23 1601 12/09/23 1629 12/10/23 0525  HGB 11.9* 11.6* 12.9* 13.3 12.3*  MCV 83.4 84.8 87.3  --  87.3    Cardiac Enzymes: No results for input(s):  CKTOTAL, CKMB, CKMBINDEX, TROPONINI in the last 168 hours. CBG: Recent Labs  Lab 12/10/23 1204 12/10/23 1658 12/10/23 2135 12/11/23 0800 12/11/23 1201  GLUCAP 106* 109* 108* 99 130*    Iron Studies: No results for input(s): IRON, TIBC, TRANSFERRIN, FERRITIN in the last 72 hours. Studies/Results: DG Chest Port 1 View Result Date: 12/09/2023 CLINICAL DATA:  Altered mental status EXAM: PORTABLE CHEST 1 VIEW COMPARISON:  Chest x-ray 11/09/2023 FINDINGS: The heart is mildly enlarged. There is central pulmonary vascular congestion. There is atelectasis or scarring in the left mid lung. There is no pleural effusion or pneumothorax. No acute fractures are seen. IMPRESSION: Mild cardiomegaly with central pulmonary vascular congestion. Electronically Signed   By: Greig Pique M.D.   On: 12/09/2023 18:00   CT Renal  Stone Study Result Date: 12/09/2023 CLINICAL DATA:  Abdominal and flank pain, stone suspected history of prostate cancer, history of hepatocellular carcinoma * Tracking Code: BO * EXAM: CT ABDOMEN AND PELVIS WITHOUT CONTRAST TECHNIQUE: Multidetector CT imaging of the abdomen and pelvis was performed following the standard protocol without IV contrast. RADIATION DOSE REDUCTION: This exam was performed according to the departmental dose-optimization program which includes automated exposure control, adjustment of the mA and/or kV according to patient size and/or use of iterative reconstruction technique. COMPARISON:  11/11/2023 FINDINGS: Lower chest: Cardiomegaly. Unchanged atelectasis or consolidation of the right lung base. Unchanged trace right pleural effusion. Small hiatal hernia. Hepatobiliary: Coarse cirrhotic morphology of the liver. Patient's known hepatocellular carcinoma not well appreciated by noncontrast CT. Tiny gallstones. Gallbladder wall thickening, or biliary dilatation. Pancreas: Unremarkable. No pancreatic ductal dilatation or surrounding inflammatory changes. Spleen: Normal in size without significant abnormality. Adrenals/Urinary Tract: Unchanged benign right adrenal adenoma, requiring no further follow-up or characterization. Kidneys are normal, without renal calculi, solid lesion, or hydronephrosis. Bladder is unremarkable. Stomach/Bowel: Stomach is within normal limits. Appendix appears normal. Colon is fluid-filled to the rectum. Vascular/Lymphatic: Aortic atherosclerosis. No enlarged abdominal or pelvic lymph nodes. Reproductive: Status post prostatectomy. Other: No abdominal wall hernia or abnormality. No ascites. Musculoskeletal: Redemonstrated superior endplate fracture deformity of the L4 vertebral body with substantial lytic component (series 7, image 155). Unchanged sclerotic superior endplate deformity of L1. Extensive bridging osteophytosis of the thoracic and upper lumbar spine in  keeping with DISH. IMPRESSION: 1. No noncontrast CT evidence of urinary tract calculus or hydronephrosis. 2. Redemonstrated superior endplate fracture deformity of the L4 vertebral body with substantial lytic component. This may reflect benign fracture resorption but is highly suspicious for underlying metastasis in this patient with known malignancy. 3. Colon is fluid-filled to the rectum, consistent with diarrheal illness. 4. Coarse cirrhotic morphology of the liver. Patient's known hepatocellular carcinoma not well appreciated by noncontrast CT. 5. Cholelithiasis. 6. Status post prostatectomy. Aortic Atherosclerosis (ICD10-I70.0). Electronically Signed   By: Marolyn JONETTA Jaksch M.D.   On: 12/09/2023 17:07   CT HEAD WO CONTRAST Result Date: 12/09/2023 CLINICAL DATA:  Altered mental status EXAM: CT HEAD WITHOUT CONTRAST TECHNIQUE: Contiguous axial images were obtained from the base of the skull through the vertex without intravenous contrast. RADIATION DOSE REDUCTION: This exam was performed according to the departmental dose-optimization program which includes automated exposure control, adjustment of the mA and/or kV according to patient size and/or use of iterative reconstruction technique. COMPARISON:  10/24/2023 FINDINGS: Brain: No evidence of acute infarction, hemorrhage, hydrocephalus, extra-axial collection or mass lesion/mass effect. Periventricular white matter hypodensity. Vascular: No hyperdense vessel or unexpected calcification. Skull: Normal. Negative for fracture or focal lesion. Sinuses/Orbits: No  acute finding. Other: None. IMPRESSION: No acute intracranial pathology.  Small-vessel white matter disease. Electronically Signed   By: Marolyn JONETTA Jaksch M.D.   On: 12/09/2023 17:00    Medications: Infusions:  sodium bicarbonate  150 mEq in dextrose  5 % 1,150 mL infusion 75 mL/hr at 12/11/23 1159    Scheduled Medications:  apixaban   2.5 mg Oral BID   Chlorhexidine  Gluconate Cloth  6 each Topical Daily    ezetimibe   10 mg Oral q AM   FLUoxetine   10 mg Oral QPM   insulin  aspart  0-5 Units Subcutaneous QHS   insulin  aspart  0-9 Units Subcutaneous TID WC   metoprolol  succinate  100 mg Oral BID   pantoprazole   40 mg Oral Daily   pregabalin   50 mg Oral BID   traZODone   25 mg Oral QHS    have reviewed scheduled and prn medications.  Physical Exam: General:NAD, comfortable Heart:RRR, s1s2 nl Lungs:clear b/l, no crackle Abdomen:soft, Non-tender, non-distended Extremities: Trace dependent edema present Neurology: Alert awake and following commands.  Dilara Navarrete Amelie Romney 12/11/2023,1:09 PM  LOS: 1 day

## 2023-12-11 NOTE — Plan of Care (Signed)

## 2023-12-11 NOTE — Progress Notes (Signed)
*  PRELIMINARY RESULTS* Echocardiogram 2D Echocardiogram has been performed.  Steve Jensen 12/11/2023, 10:50 AM

## 2023-12-11 NOTE — Plan of Care (Signed)

## 2023-12-11 NOTE — Evaluation (Signed)
 Physical Therapy Evaluation Patient Details Name: Steve Jensen MRN: [id number] DOB: 1941/01/15 Today's Date: 12/11/2023  History of Present Illness  Pt is an 83 year old man admitted from [facility] where he is a resident on 12/09/23 with AMS and acute renal failure. PMH: HTN, DM2, OSA, morbid obesity, PAF, CHF, hepatocellular carcinoma, C-diff with ongoing diarrhea, CKD, L4 compression fracture (09/2023) managed with LSO, prostate cancer.  Clinical Impression  Pt presents with admitting diagnosis above. Co-treat with OT. Pt today was able to sit EOB with +2 total A. PTA pt daughter reports that he was bedbound at [facility] and used a hoyer lift to transfer. Pt presents at or near baseline mobility. Pt has no further acute PT needs and will be signing off. Re consult PT if mobility status changes.         If plan is discharge home, recommend the following: Two people to help with walking and/or transfers;A lot of help with bathing/dressing/bathroom;Supervision due to cognitive status;Help with stairs or ramp for entrance;Assist for transportation;Direct supervision/assist for financial management;Direct supervision/assist for medications management   Can travel by private vehicle        Equipment Recommendations None recommended by PT  Recommendations for Other Services       Functional Status Assessment Patient has had a recent decline in their functional status and demonstrates the ability to make significant improvements in function in a reasonable and predictable amount of time.     Precautions / Restrictions Precautions Precautions: Fall Recall of Precautions/Restrictions: Intact Restrictions Weight Bearing Restrictions Per Provider Order: No      Mobility  Bed Mobility Overal bed mobility: Needs Assistance Bed Mobility: Supine to Sit, Sit to Supine     Supine to sit: +2 for physical assistance, [place] assist Sit to supine: +2 for physical assistance, Total assist    General bed mobility comments: pt able to advance LEs over EOB with min assist, Raedyn +2 to raise trunk and use of bed pad for hips to EOB, returned to supine with assist of trunk and LEs    Transfers                   General transfer comment: deferred, pt requires lift equipment for OOB, RN notified    Ambulation/Gait                  Stairs            Wheelchair Mobility     Tilt Bed    Modified Rankin (Stroke Patients Only)       Balance Overall balance assessment: Needs assistance Sitting-balance support: Feet supported, Bilateral upper extremity supported Sitting balance-Leahy Scale: Poor   Postural control: Posterior lean, Left lateral lean                                   Pertinent Vitals/Pain Pain Assessment Pain Assessment: Faces Faces Pain Scale: No hurt    Home Living Family/patient expects to be discharged to:: Skilled nursing facility                        Prior Function Prior Level of Function : Needs assist             Mobility Comments: per daughter in law, pt requires hoyer lift for transfers ADLs Comments: per daughter in law, pt self feeds and participates in grooming,  he is otherwise dependent in ADLs and IADLs     Extremity/Trunk Assessment   Upper Extremity Assessment Upper Extremity Assessment: Overall WFL for tasks assessed    Lower Extremity Assessment Lower Extremity Assessment: Generalized weakness    Cervical / Trunk Assessment Cervical / Trunk Assessment: Other exceptions (weakness, obesity)  Communication   Communication Communication: No apparent difficulties    Cognition Arousal: Alert Behavior During Therapy: Flat affect                             Following commands: Impaired Following commands impaired: Follows one step commands with increased time     Cueing Cueing Techniques: Verbal cues     General Comments General comments (skin integrity,  edema, etc.): VSS    Exercises     Assessment/Plan    PT Assessment All further PT needs can be met in the next venue of care  PT Problem List Decreased strength;Decreased range of motion;Decreased activity tolerance;Decreased balance;Decreased mobility;Decreased cognition;Decreased coordination;Decreased knowledge of use of DME;Decreased safety awareness;Cardiopulmonary status limiting activity;Decreased knowledge of precautions       PT Treatment Interventions      PT Goals (Current goals can be found in the Care Plan section)       Frequency       Co-evaluation PT/OT/SLP Co-Evaluation/Treatment: Yes Reason for Co-Treatment: For patient/therapist safety PT goals addressed during session: Mobility/safety with mobility;Proper use of DME OT goals addressed during session: ADL's and self-care;Strengthening/ROM       AM-PAC PT 6 Clicks Mobility  Outcome Measure Help needed turning from your back to your side while in a flat bed without using bedrails?: Total Help needed moving from lying on your back to sitting on the side of a flat bed without using bedrails?: Total Help needed moving to and from a bed to a chair (including a wheelchair)?: Total Help needed standing up from a chair using your arms (e.g., wheelchair or bedside chair)?: Total Help needed to walk in hospital room?: Total Help needed climbing 3-5 steps with a railing? : Total 6 Click Score: 6    End of Session   Activity Tolerance: Patient tolerated treatment well Patient left: in bed;with call bell/phone within reach;with bed alarm set;with family/visitor present Nurse Communication: Mobility status;Need for lift equipment PT Visit Diagnosis: Other abnormalities of gait and mobility (R26.89)    Time: 8647-8575 PT Time Calculation (min) (ACUTE ONLY): 32 min   Charges:   PT Evaluation $PT Eval Moderate Complexity: 1 Mod   PT General Charges $$ ACUTE PT VISIT: 1 Visit         Sueellen NOVAK, PT,  DPT Acute Rehab Services [phone]   Coda Filler 12/11/2023, 4:23 PM

## 2023-12-12 DIAGNOSIS — N179 Acute kidney failure, unspecified: Secondary | ICD-10-CM | POA: Diagnosis not present

## 2023-12-12 DIAGNOSIS — G9341 Metabolic encephalopathy: Secondary | ICD-10-CM | POA: Diagnosis not present

## 2023-12-12 DIAGNOSIS — E8721 Acute metabolic acidosis: Secondary | ICD-10-CM | POA: Diagnosis not present

## 2023-12-12 DIAGNOSIS — A0472 Enterocolitis due to Clostridium difficile, not specified as recurrent: Secondary | ICD-10-CM | POA: Diagnosis not present

## 2023-12-12 LAB — CBC
HCT: 35.5 % — ABNORMAL LOW (ref 39.0–52.0)
Hemoglobin: 11.8 g/dL — ABNORMAL LOW (ref 13.0–17.0)
MCH: 28 pg (ref 26.0–34.0)
MCHC: 33.2 g/dL (ref 30.0–36.0)
MCV: 84.3 fL (ref 80.0–100.0)
Platelets: 287 K/uL (ref 150–400)
RBC: 4.21 MIL/uL — ABNORMAL LOW (ref 4.22–5.81)
RDW: 18 % — ABNORMAL HIGH (ref 11.5–15.5)
WBC: 6.9 K/uL (ref 4.0–10.5)
nRBC: 0 % (ref 0.0–0.2)

## 2023-12-12 LAB — ALBUMIN: Albumin: 2.5 g/dL — ABNORMAL LOW (ref 3.5–5.0)

## 2023-12-12 LAB — BASIC METABOLIC PANEL WITH GFR
Anion gap: 18 — ABNORMAL HIGH (ref 5–15)
BUN: 51 mg/dL — ABNORMAL HIGH (ref 8–23)
CO2: 19 mmol/L — ABNORMAL LOW (ref 22–32)
Calcium: 8.2 mg/dL — ABNORMAL LOW (ref 8.9–10.3)
Chloride: 100 mmol/L (ref 98–111)
Creatinine, Ser: 6.71 mg/dL — ABNORMAL HIGH (ref 0.61–1.24)
GFR, Estimated: 8 mL/min — ABNORMAL LOW (ref >60)
Glucose, Bld: 96 mg/dL (ref 70–99)
Potassium: 3.3 mmol/L — ABNORMAL LOW (ref 3.5–5.1)
Sodium: 137 mmol/L (ref 135–145)

## 2023-12-12 LAB — GLUCOSE, CAPILLARY
Glucose-Capillary: 99 mg/dL (ref 70–99)
Glucose-Capillary: 126 mg/dL — ABNORMAL HIGH (ref 70–99)
Glucose-Capillary: 125 mg/dL — ABNORMAL HIGH (ref 70–99)
Glucose-Capillary: 176 mg/dL — ABNORMAL HIGH (ref 70–99)

## 2023-12-12 LAB — MAGNESIUM: Magnesium: 1.8 mg/dL (ref 1.7–2.4)

## 2023-12-12 MED ORDER — LOPERAMIDE HCL 2 MG PO CAPS
2.0000 mg | ORAL_CAPSULE | Freq: Every day | ORAL | Status: DC
  Administered 2023-12-12 – 2023-12-15 (×4): 2 mg via ORAL
  Filled 2023-12-12 (×6): qty 1

## 2023-12-12 MED ORDER — FUROSEMIDE 10 MG/ML IJ SOLN: 80.0000 mg | Freq: Once | INTRAMUSCULAR | Status: DC

## 2023-12-12 MED ORDER — SODIUM BICARBONATE 650 MG PO TABS
650.0000 mg | ORAL_TABLET | Freq: Two times a day (BID) | ORAL | Status: DC
  Administered 2023-12-12 – 2023-12-14 (×5): 650 mg via ORAL
  Filled 2023-12-12 (×5): qty 1

## 2023-12-12 MED ORDER — FUROSEMIDE 10 MG/ML IJ SOLN
80.0000 mg | Freq: Once | INTRAMUSCULAR | Status: AC
  Administered 2023-12-12: 80 mg via INTRAVENOUS
  Filled 2023-12-12: qty 8

## 2023-12-12 MED ORDER — POTASSIUM CHLORIDE CRYS ER 20 MEQ PO TBCR
40.0000 meq | EXTENDED_RELEASE_TABLET | Freq: Once | ORAL | Status: AC
  Administered 2023-12-12: 40 meq via ORAL
  Filled 2023-12-12: qty 2

## 2023-12-12 MED ORDER — HEPARIN (PORCINE) 25000 UT/250ML-% IV SOLN
1500.0000 [IU]/h | INTRAVENOUS | Status: DC
  Administered 2023-12-12 – 2023-12-15 (×4): 1500 [IU]/h via INTRAVENOUS
  Filled 2023-12-12 (×4): qty 250

## 2023-12-12 NOTE — Plan of Care (Signed)

## 2023-12-12 NOTE — Plan of Care (Signed)

## 2023-12-12 NOTE — Progress Notes (Signed)
 [facility] NEPHROLOGY PROGRESS NOTE  Assessment/ Plan: Pt is a 83 y.o. yo male  with a past medical history of hypertension, DM2, HFpEF, OSA, obesity, hepatocellular carcinoma, history of prostate cancer status post prostatectomy, A-fib on Eliquis , GERD who presents to [facility] with altered mental status and worsening renal function which was done at his nursing facility.   # AKI on CKD3a -baseline seems to be around 1.2-1.3. Suspecting AKI is secondary to prolonged pre-renal injury. No obstruction on CT -Treated with IV fluid with improving urine output however the serum creatinine level worsening.  Examination consistent with evidence of fluid overload therefore I will discontinue IV fluid and order a dose of furosemide .   -Continue strict ins and outs, daily lab monitoring.  Patient may need dialysis if no improvement in renal function in next few days.   # Acute on chronic HFpEF exacerbation -BNP elevated. CXR with vascular congestion.  Difficult to assess volume status because of body habitus.  Stop IV fluid and order a dose of Lasix .   # Metabolic acidosis -likely secondary to chronic diarrhea, lactic acidosis, AKI.  Treated with oral sodium bicarbonate .  Now Lasix .  # Hypokalemia: Replete potassium chloride .  Follow lab.   #H/o Cdiff  Diarrhea -repeat cdiff negative here.   Discussed with primary team.  Subjective:  Seen and examined.  Urine output around 2.6 L.  Patient has worsening leg edema.  Review of system is limited.  However he denies nausea, vomiting, chest pain or shortness of breath.  Feels tired. Objective Vital signs in last 24 hours: Vitals:   12/11/23 2000 12/11/23 2108 12/12/23 0400 12/12/23 0801  BP: (!) 110/91 132/79 107/61 137/82  Pulse: 89 82 84 72  Resp: 19  18   Temp: 97.8 F (36.6 C)  98 F (36.7 C) 97.9 F (36.6 C)  TempSrc: Oral  Oral Oral  SpO2: 94%  92% 94%   Weight change:   Intake/Output Summary (Last 24 hours) at 12/12/2023  0922 Last data filed at 12/12/2023 0456 Gross per 24 hour  Intake 480 ml  Output 1375 ml  Net -895 ml       Labs: RENAL PANEL Recent Labs  Lab 12/09/23 1601 12/09/23 1629 12/10/23 0525 12/10/23 1538 12/11/23 0543 12/12/23 0549  NA 137 137 135 135 139 137  K 4.2 4.2 3.8 3.6 3.5 3.3*  CL 104  --  106 105 112* 100  CO2 18*  --  16* 17* 12* 19*  GLUCOSE 83  --  93 113* 92 96  BUN 51*  --  52* 54* 51* 51*  CREATININE 6.07*  --  6.11* 6.41* 6.06* 6.71*  CALCIUM  9.1  --  8.6* 8.6* 7.6* 8.2*  MG  --   --   --   --  1.7 1.8  PHOS  --   --   --   --  4.8*  --   ALBUMIN  2.8*  --   --   --   --  2.5*    Liver Function Tests: Recent Labs  Lab 12/09/23 1601 12/12/23 0549  AST 20  --   ALT 10  --   ALKPHOS 95  --   BILITOT 0.6  --   PROT 5.5*  --   ALBUMIN  2.8* 2.5*   No results for input(s): LIPASE, AMYLASE in the last 168 hours. Recent Labs  Lab 12/09/23 1601  AMMONIA 14   CBC: Recent Labs    11/16/23 0601 12/09/23 1601 12/09/23 1629 12/10/23  9474 12/12/23 0549  HGB 11.6* 12.9* 13.3 12.3* 11.8*  MCV 84.8 87.3  --  87.3 84.3    Cardiac Enzymes: No results for input(s): CKTOTAL, CKMB, CKMBINDEX, TROPONINI in the last 168 hours. CBG: Recent Labs  Lab 12/11/23 0800 12/11/23 1201 12/11/23 1602 12/11/23 2107 12/12/23 0755  GLUCAP 99 130* 176* 116* 99    Iron Studies: No results for input(s): IRON, TIBC, TRANSFERRIN, FERRITIN in the last 72 hours. Studies/Results: ECHOCARDIOGRAM COMPLETE Result Date: 12/11/2023    ECHOCARDIOGRAM REPORT   Patient Name:   Steve Jensen Date of Exam: 12/11/2023 Medical Rec #:  [id number]      Height:       69.0 in Accession #:    [id number]     Weight:       272.0 lb Date of Birth:  [birth year]     BSA:          2.355 m Patient Age:    82 years       BP:           122/90 mmHg Patient Gender: M              HR:           92 bpm. Exam Location:  Inpatient Procedure: 2D Echo, Cardiac Doppler and Color Doppler  (Both Spectral and Color            Flow Doppler were utilized during procedure). Indications:    Cardiomyopathy  History:        Patient has prior history of Echocardiogram examinations, most                 recent 09/04/2023. Risk Factors:Diabetes.  Sonographer:    Benard Stallion Referring Phys: [id number] BURGESS BROCKS AMIN IMPRESSIONS  1. Left ventricular ejection fraction, by estimation, is 65 to 70%. The left ventricle has normal function. The left ventricle has no regional wall motion abnormalities. Left ventricular diastolic parameters are indeterminate.  2. Right ventricular systolic function is normal. The right ventricular size is normal.  3. Left atrial size was mildly dilated.  4. Right atrial size was moderately dilated.  5. Mild mitral valve regurgitation.  6. The aortic valve is tricuspid. Aortic valve regurgitation is trivial. Aortic valve sclerosis/calcification is present, without any evidence of aortic stenosis. Comparison(s): The left ventricular function is unchanged. FINDINGS  Left Ventricle: Left ventricular ejection fraction, by estimation, is 65 to 70%. The left ventricle has normal function. The left ventricle has no regional wall motion abnormalities. The left ventricular internal cavity size was normal in size. There is  no left ventricular hypertrophy. Left ventricular diastolic parameters are indeterminate. Right Ventricle: The right ventricular size is normal. Right vetricular wall thickness was not assessed. Right ventricular systolic function is normal. Left Atrium: Left atrial size was mildly dilated. Right Atrium: Right atrial size was moderately dilated. Pericardium: There is no evidence of pericardial effusion. Mitral Valve: There is mild thickening of the mitral valve leaflet(s). Mild mitral annular calcification. Mild mitral valve regurgitation. Tricuspid Valve: The tricuspid valve is normal in structure. Tricuspid valve regurgitation is mild. Aortic Valve: The aortic valve is  tricuspid. Aortic valve regurgitation is trivial. Aortic valve sclerosis/calcification is present, without any evidence of aortic stenosis. Aortic valve mean gradient measures 3.0 mmHg. Aortic valve peak gradient measures 5.2 mmHg. Aortic valve area, by VTI measures 3.43 cm. Pulmonic Valve: The pulmonic valve was not well visualized. Pulmonic valve regurgitation is not visualized. Aorta: The aortic root is  normal in size and structure. IAS/Shunts: No atrial level shunt detected by color flow Doppler.  LEFT VENTRICLE PLAX 2D LVIDd:         4.90 cm   Diastology LVIDs:         3.55 cm   LV e' medial:    13.80 cm/s LV PW:         1.00 cm   LV E/e' medial:  8.3 LV IVS:        1.00 cm   LV e' lateral:   12.50 cm/s LVOT diam:     2.30 cm   LV E/e' lateral: 9.1 LV SV:         71 LV SV Index:   30 LVOT Area:     4.15 cm  RIGHT VENTRICLE RV S prime:     9.46 cm/s TAPSE (M-mode): 1.6 cm LEFT ATRIUM             Index        RIGHT ATRIUM           Index LA diam:        4.80 cm 2.04 cm/m   RA Area:     27.40 cm LA Vol (A2C):   82.0 ml 34.83 ml/m  RA Volume:   95.50 ml  40.56 ml/m LA Vol (A4C):   88.9 ml 37.76 ml/m LA Biplane Vol: 91.7 ml 38.95 ml/m  AORTIC VALVE AV Area (Vmax):    3.58 cm AV Area (Vmean):   3.37 cm AV Area (VTI):     3.43 cm AV Vmax:           114.00 cm/s AV Vmean:          80.000 cm/s AV VTI:            0.207 m AV Peak Grad:      5.2 mmHg AV Mean Grad:      3.0 mmHg LVOT Vmax:         98.10 cm/s LVOT Vmean:        64.900 cm/s LVOT VTI:          0.171 m LVOT/AV VTI ratio: 0.83  AORTA Ao Root diam: 3.50 cm MITRAL VALVE                TRICUSPID VALVE MV Area (PHT): 5.02 cm     TR Peak grad:   36.7 mmHg MV Decel Time: 151 msec     TR Vmax:        303.00 cm/s MR Peak grad: 61.2 mmHg MR Vmax:      391.00 cm/s   SHUNTS MV E velocity: 114.00 cm/s  Systemic VTI:  0.17 m MV A velocity: 63.60 cm/s   Systemic Diam: 2.30 cm MV E/A ratio:  1.79 Vina Gull MD Electronically signed by Vina Gull MD Signature  Date/Time: 12/11/2023/2:45:34 PM    Final     Medications: Infusions:    Scheduled Medications:  apixaban   2.5 mg Oral BID   Chlorhexidine  Gluconate Cloth  6 each Topical Daily   ezetimibe   10 mg Oral q AM   FLUoxetine   10 mg Oral QPM   insulin  aspart  0-5 Units Subcutaneous QHS   insulin  aspart  0-9 Units Subcutaneous TID WC   loperamide   2 mg Oral Daily   metoprolol  succinate  100 mg Oral BID   pantoprazole   40 mg Oral Daily   potassium chloride   40 mEq Oral Once   pregabalin   50 mg Oral  BID   traZODone   25 mg Oral QHS    have reviewed scheduled and prn medications.  Physical Exam: General: Sitting on bed, alert and awake. Heart:RRR, s1s2 nl Lungs: Bibasilar reduced breath sound Abdomen:soft, Non-tender, non-distended Extremities: Bilateral leg pitting edema present+ Neurology: Alert awake and following commands.  Azadeh Hyder Amelie Romney 12/12/2023,9:22 AM  LOS: 2 days

## 2023-12-12 NOTE — Progress Notes (Signed)
 PHARMACY - ANTICOAGULATION CONSULT NOTE  Pharmacy Consult for apixaban >>heparin  Indication: atrial fibrillation  Allergies  Allergen Reactions   Lipitor [Atorvastatin] Other (See Comments)    Myalgias   Zithromax [Azithromycin] Other (See Comments)    GI intolerance   Zocor [Simvastatin] Other (See Comments)    Myalgias    Dificid  [Fidaxomicin ] Rash   Norvasc  [Amlodipine ] Swelling    Patient Measurements:  Dosing wt = 100kg  Vital Signs: Temp: 97.7 F (36.5 C) (07/19 1200) Temp Source: Oral (07/19 1200) BP: 115/67 (07/19 1200) Pulse Rate: 83 (07/19 1200)  Labs: Recent Labs    12/09/23 1601 12/09/23 1629 12/10/23 0525 12/10/23 1538 12/11/23 0543 12/12/23 0549  HGB 12.9* 13.3 12.3*  --   --  11.8*  HCT 40.5 39.0 38.4*  --   --  35.5*  PLT 436*  --  388  --   --  287  CREATININE 6.07*  --  6.11* 6.41* 6.06* 6.71*    CrCl cannot be calculated (Unknown ideal weight.).   Medical History: Past Medical History:  Diagnosis Date   Anemia    Diabetes mellitus type II, controlled (HCC)    with neuropathy   Dysrhythmia    A-Fib. cardioversion done   GERD (gastroesophageal reflux disease)    Hyperlipidemia    Hypertension    Morbid obesity (HCC)    Neuromuscular disorder (HCC)    feet   OSA (obstructive sleep apnea)    On BiPAP at 15/11cm H2O   Prostate cancer (HCC)    Shingles    on face Nov 2010   Urinary incontinence     Medications:  Medications Prior to Admission  Medication Sig Dispense Refill Last Dose/Taking   allopurinol  (ZYLOPRIM ) 100 MG tablet Take 100 mg by mouth in the morning.   12/09/2023   apixaban  (ELIQUIS ) 5 MG TABS tablet Take 5 mg by mouth 2 (two) times daily.   12/09/2023 at  9:42 AM   Cholecalciferol (VITAMIN D) 50 MCG (2000 UT) CAPS Take 2,000 Units by mouth in the morning.   12/09/2023   ezetimibe  (ZETIA ) 10 MG tablet Take 10 mg by mouth in the morning.   12/09/2023   ferrous sulfate 325 (65 FE) MG tablet Take 325 mg by mouth daily with  breakfast.   12/09/2023   FLUoxetine  (PROZAC ) 10 MG capsule Take 10 mg by mouth every evening.   12/08/2023   furosemide  (LASIX ) 40 MG tablet Take 40 mg by mouth in the morning.   12/09/2023   guaiFENesin  (MUCINEX ) 600 MG 12 hr tablet Take 600 mg by mouth 2 (two) times daily as needed for cough or to loosen phlegm.   Unknown   insulin  aspart (NOVOLOG ) 100 UNIT/ML injection Inject 0-10 Units into the skin 3 (three) times daily before meals. Sliding scale: if sugar is 150-200 = 2 units, if 201-250 = 4 units, if 251-300 = 6 units, if 301-350 = 8 units, if 351-400 = 10 units, give blood sugar is greater than 400 then call the MD.   12/09/2023 Morning   Insulin  Glargine-Lixisenatide (SOLIQUA) 100-33 UNT-MCG/ML SOPN Inject 22 Units into the skin in the morning.   12/08/2023   Magnesium  Glycinate 100 MG CAPS Take 1 capsule by mouth in the morning and at bedtime.   12/09/2023 Morning   metFORMIN  (GLUCOPHAGE ) 1000 MG tablet Take 1,000 mg by mouth 2 (two) times daily with a meal.  0 12/09/2023 Morning   metoprolol  succinate (TOPROL -XL) 100 MG 24 hr tablet Take 1 tablet (100  mg total) by mouth 2 (two) times daily. Take with or immediately following a meal.   12/09/2023 Morning   Multiple Vitamins-Minerals (MULTIVITAMIN MEN 50+) TABS Take 1 tablet by mouth every evening.   12/08/2023   Omega-3 Fatty Acids (FISH OIL) 1000 MG CAPS Take 1,000 mg by mouth 2 (two) times daily.   12/09/2023 Morning   omeprazole  (PRILOSEC) 20 MG capsule Take 1 capsule (20 mg total) by mouth daily. (Patient taking differently: Take 20 mg by mouth in the morning.)   12/09/2023   potassium chloride  (KLOR-CON  M) 20 MEQ tablet Please take 40 mEq daily for 5 days and then 20 mEq daily (Patient taking differently: Take 40 mEq by mouth daily.)   12/09/2023   pregabalin  (LYRICA ) 50 MG capsule Take 1 capsule (50 mg total) by mouth 2 (two) times daily. 30 capsule 0 12/09/2023 Morning   traZODone  (DESYREL ) 50 MG tablet Take 25 mg by mouth at bedtime.   12/09/2023  at  2:09 AM   zinc  oxide 20 % ointment Apply 1 Application topically See admin instructions. Every shift   12/09/2023 Morning   Scheduled:   Chlorhexidine  Gluconate Cloth  6 each Topical Daily   ezetimibe   10 mg Oral q AM   FLUoxetine   10 mg Oral QPM   insulin  aspart  0-5 Units Subcutaneous QHS   insulin  aspart  0-9 Units Subcutaneous TID WC   loperamide   2 mg Oral Daily   metoprolol  succinate  100 mg Oral BID   pantoprazole   40 mg Oral Daily   potassium chloride   40 mEq Oral Once   pregabalin   50 mg Oral BID   sodium bicarbonate   650 mg Oral BID   traZODone   25 mg Oral QHS   Infusions:   Assessment: Pt has been on apixaban  for AF. He will need an HD cath placement soon. Order to bridge with heparin  for now. We will use PTT for now until it correlates with heparin  level.   Scr 6.71 Hgb ~12 Plt wnl  Goal of Therapy:  Heparin  level 0.3-0.7 units/ml aPTT 66-102 seconds Monitor platelets by anticoagulation protocol: Yes   Plan:  Heparin  1500 units/hr at 2130 tonight Check 8 hr HL and PTT Daily HL/PTT and CBC  Sergio Batch, PharmD, BCIDP, AAHIVP, CPP Infectious Disease Pharmacist 12/12/2023 3:02 PM

## 2023-12-12 NOTE — TOC Initial Note (Signed)
 Transition of Care [facility]) - Initial/Assessment Note    Patient Details  Name: Steve Jensen MRN: [id number] Date of Birth: 05-09-1941  [facility]) CM/SW Contact:    Luise JAYSON Pan, LCSWA Phone Number: 12/12/2023, 11:16 AM  Clinical Narrative:    Patient is from [facility]. CSW confirmed with his daughter, Rojelio, that he is from [place]. Rojelio is agreeable to patient discharging back when medically ready.   TOC will continue to follow.                Expected Discharge Plan: Skilled Nursing Facility Barriers to Discharge: Continued Medical Work up   Patient Goals and CMS Choice Patient states their goals for this hospitalization and ongoing recovery are:: Return to [facility]          Expected Discharge Plan and Services In-house Referral: Clinical Social Work     Living arrangements for the past 2 months: Skilled Holiday representative                                      Prior Living Arrangements/Services Living arrangements for the past 2 months: Skilled Nursing Facility Lives with:: Facility Resident Patient language and need for interpreter reviewed:: Yes Do you feel safe going back to the place where you live?: Yes      Need for Family Participation in Patient Care: Yes (Comment) Care giver support system in place?: Yes (comment)   Criminal Activity/Legal Involvement Pertinent to Current Situation/Hospitalization: No - Comment as needed  Activities of Daily Living   ADL Screening (condition at time of admission) Independently performs ADLs?: No Does the patient have a NEW difficulty with bathing/dressing/toileting/self-feeding that is expected to last >3 days?: Yes (Initiates electronic notice to provider for possible OT consult) Does the patient have a NEW difficulty with getting in/out of bed, walking, or climbing stairs that is expected to last >3 days?: Yes (Initiates electronic notice to provider for possible PT  consult) Does the patient have a NEW difficulty with communication that is expected to last >3 days?: No Is the patient deaf or have difficulty hearing?: No Does the patient have difficulty seeing, even when wearing glasses/contacts?: No Does the patient have difficulty concentrating, remembering, or making decisions?: No  Permission Sought/Granted Permission sought to share information with : Family Supports, Oceanographer granted to share information with : No (From a facility, family is on [employer])  Share Information with NAME: BEVERLY  Permission granted to share info w AGENCY: SNF  Permission granted to share info w Relationship: Daughter  Permission granted to share info w Contact Information: [phone]  Emotional Assessment Appearance:: Appears stated age Attitude/Demeanor/Rapport: Unable to Assess Affect (typically observed): Unable to Assess Orientation: : Oriented to Self, Oriented to Place, Oriented to  Time, Oriented to Situation Alcohol / Substance Use: Not Applicable Psych Involvement: No (comment)  Admission diagnosis:  Dehydration [E86.0] C. difficile diarrhea [A04.72] Acute cystitis with hematuria [N30.01] AKI (acute kidney injury) (HCC) [N17.9] Patient Active Problem List   Diagnosis Date Noted   CHF exacerbation (HCC) 12/10/2023   UTI (urinary tract infection) 12/10/2023   C. difficile diarrhea 11/10/2023   Acute metabolic encephalopathy 11/09/2023   CKD stage 3b, GFR 30-44 ml/min (HCC) 11/09/2023   Lumbar compression fracture (HCC) 11/09/2023   Gout 11/09/2023   Acute metabolic acidosis 10/24/2023   SOB (shortness of breath) 10/24/2023   AKI (  acute kidney injury) (HCC) 09/04/2023   Long term (current) use of anticoagulants 09/04/2023   Chronic heart failure with preserved ejection fraction (HFpEF) (HCC) 09/04/2023   Hypokalemia 11/11/2022   Ileus (HCC)    Uncontrolled type 2 diabetes mellitus with hyperglycemia,  without long-term current use of insulin  (HCC) 12/17/2021   Osteoarthritis of right knee 12/17/2021   HCC (hepatocellular carcinoma) (HCC) 02/15/2018   Hepatocellular carcinoma (HCC) 12/30/2017   Pain in joint of left shoulder 11/09/2017   Hypertensive heart disease with heart failure (HCC) 03/23/2017   Chronic diastolic heart failure (HCC) 03/19/2015   PAF (paroxysmal atrial fibrillation) (HCC) 08/11/2013   S/P left knee arthroscopy 07/11/2013   Mixed hyperlipidemia 03/03/2013   Essential hypertension, benign 03/03/2013   Malignant neoplasm of prostate (HCC) 03/03/2013   Osteoarthrosis, unspecified whether generalized or localized, other specified sites 03/03/2013   Allergic rhinitis 03/03/2013   Type 2 diabetes mellitus (HCC) 03/03/2013   Diabetic polyneuropathy (HCC) 03/03/2013   OSA (obstructive sleep apnea) 03/03/2013   Proteinuria 03/03/2013   Morbid obesity (HCC) 03/03/2013   Right knee pain 02/07/2013   PCP:  System, Provider Not In Pharmacy:   [id number] - [place], [place] - [address] [place] [address] Phone: [phone] Fax: [phone]  [facility] - [place], [place] - [address] [place] [address] Phone: [phone] Fax: [phone]     Social Drivers of Health (SDOH) Social History: SDOH Screenings   Food Insecurity: No Food Insecurity (12/10/2023)  Housing: Low Risk  (12/10/2023)  Transportation Needs: No Transportation Needs (12/10/2023)  Utilities: Not At Risk (12/10/2023)  Social Connections: Socially Isolated (12/10/2023)  Tobacco Use: Medium Risk (12/09/2023)   SDOH Interventions:     Readmission Risk Interventions    12/26/2021   12:51 PM 12/25/2021    3:31 PM 12/19/2021   12:39 PM  Readmission Risk Prevention Plan  Transportation Screening  Complete Complete  PCP or Specialist Appt within 5-7 Days Complete    Home Care Screening   Complete   Medication Review (RN CM)  Complete   HRI or Home Care Consult   Complete  Social Work Consult for Recovery Care Planning/Counseling   Complete  Palliative Care Screening   Not Applicable  Medication Review Oceanographer)   Complete

## 2023-12-12 NOTE — Progress Notes (Addendum)
 PROGRESS NOTE        PATIENT DETAILS Name: Steve Jensen Age: 83 y.o. Sex: male Date of Birth: 1940/11/28 Admit Date: 12/09/2023 Admitting Physician Editha Ram, MD ERE:[email or web address], Provider Not In  Brief Summary: Patient is a 83 y.o.  male with history of A-fib, HTN, chronic HFrEF, PAF, CKD stage IIIa, hepatocellular CA, prostate CA, OSA on CPAP-presented to the hospital with confusion-found to have AKI.  Significant events: 7/17>> admit to [facility].  Significant studies: 7/16>>CT Head:no acute intracranial pathology 7/16>>CT Renal Stone study: No hydronephrosis. 7/16>> CXR: No PNA  Significant microbiology data: 7/16>> urine culture: Multiple species. 7/16>> blood culture: No growth 7/17>> GI pathogen panel: Negative 7/17>> stool C. difficile PCR: Negative  Procedures: None  Consults: Renal  Subjective: No major issues overnight-acknowledges having diarrhea for the past several months.  Objective: Vitals: Blood pressure 115/67, pulse 83, temperature 97.7 F (36.5 C), temperature source Oral, resp. rate 18, SpO2 95%.   Exam: Gen Exam:Alert awake-not in any distress HEENT:atraumatic, normocephalic Chest: B/L clear to auscultation anteriorly CVS:S1S2 regular Abdomen:soft non tender, non distended Extremities:++ edema Neurology: Non focal Skin: no rash  Pertinent Labs/Radiology:    Latest Ref Rng & Units 12/12/2023    5:49 AM 12/10/2023    5:25 AM 12/09/2023    4:29 PM  CBC  WBC 4.0 - 10.5 K/uL 6.9  10.1    Hemoglobin 13.0 - 17.0 g/dL 88.1  87.6  86.6   Hematocrit 39.0 - 52.0 % 35.5  38.4  39.0   Platelets 150 - 400 K/uL 287  388      Lab Results  Component Value Date   NA 137 12/12/2023   K 3.3 (L) 12/12/2023   CL 100 12/12/2023   CO2 19 (L) 12/12/2023      Assessment/Plan: AKI on CKD stage IIIa AKI likely hemodynamically mediated in the setting of diarrhea/diuretic/metformin  use. Unfortunately-no significant improvement  in renal function after almost 48 hours of IV fluid supportive care.  Thankfully great urine output overnight.  K stable. Has now developed of volume overload-with more leg edema IV fluids being stopped If no significant improvement-May need to start HD-nephrology following.  Non-anion gap metabolic acidosis Due to combination of diarrhea and AKI Improved after initiation of IVF with bicarb.  Due to development of volume overload-IVF being stopped Starting oral bicarb.  Hypokalemia Replete/recheck  Acute metabolic encephalopathy Secondary to AKI Improved-answering questions appropriately.  Not clear if he is back to his baseline-no family at bedside.  Acute on chronic HFpEF More leg edema-but this is primarily due to IV fluid resuscitation-still able to lie flat IV fluid being stopped 1 dose of IV Lasix  per nephrology. Follow electrolytes/volume status closely.  Chronic atrial fibrillation Rate controlled Telemetry monitoring Metoprolol /Eliquis .  Addendum Hold Eliquis -will start heparin -in anticipation of probably requiring hemodialysis catheter insertion soon.  ?  UTI Has no symptoms-2 samples of UA with discordant results Since not symptomatic-has recent diagnosis of C. difficile-antibiotics stopped on 7/18-and being monitored off antibiotics.    Chronic diarrhea Ongoing for the past 3-4 months Repeat stool studies negative for C. difficile/infection Will schedule Imodium  And see how he does If diarrhea does not improve or gets worse-will need GI evaluation.  Recent history of C. difficile diarrhea Completed course of oral vancomycin -repeat stool studies negative.  HLD Zetia   DM-2 (A1c 7.5 on 4/11) CBG stable SSI  for now.   Recent Labs    12/11/23 2107 12/12/23 0755 12/12/23 1220  GLUCAP 116* 99 126*    Peripheral neuropathy Lyrica .  History of hepatocellular carcinoma History of prostate cancer-s/p prostatectomy Outpatient oncology/urology  follow-up.  Recent history of L4 vertebral body compression fracture Repeat CT this admit-with lytic component-with concern for metastatic disease. Continue T SLO brace-outpatient neurosurgical follow-up  Mood disorder Relatively stable Continue Prozac /trazodone .  OSA CPAP nightly  Class 3 Obesity: Estimated body mass index is 40.17 kg/m as calculated from the following:   Height as of 11/10/23: 5' 9 (1.753 m).   Weight as of 11/17/23: 123.4 kg.   Code status:   Code Status: Limited: Do not attempt resuscitation (DNR) -DNR-LIMITED -Do Not Intubate/DNI    DVT Prophylaxis: apixaban  (ELIQUIS ) tablet 2.5 mg Start: 12/10/23 2200 apixaban  (ELIQUIS ) tablet 2.5 mg    Family Communication: Daughter-[phone] updated on 7/19 (had left a voicemail earlier this morning, and on 7/18 as well) Son-[phone] left voicemail 7/19.   Disposition Plan: Status is: Inpatient Remains inpatient appropriate because: Severity of illness   Planned Discharge Destination:Skilled nursing facility   Diet: Diet Order             Diet heart healthy/carb modified Room service appropriate? Yes; Fluid consistency: Thin  Diet effective now                     Antimicrobial agents: Anti-infectives (From admission, onward)    Start     Dose/Rate Route Frequency Ordered Stop   12/10/23 2300  cefTRIAXone  (ROCEPHIN ) 1 g in sodium chloride  0.9 % 100 mL IVPB  Status:  Discontinued        1 g 200 mL/hr over 30 Minutes Intravenous Every 24 hours 12/10/23 0353 12/11/23 1013   12/09/23 2300  cefTRIAXone  (ROCEPHIN ) 2 g in sodium chloride  0.9 % 100 mL IVPB        2 g 200 mL/hr over 30 Minutes Intravenous  Once 12/09/23 2249 12/10/23 0117        MEDICATIONS: Scheduled Meds:  apixaban   2.5 mg Oral BID   Chlorhexidine  Gluconate Cloth  6 each Topical Daily   ezetimibe   10 mg Oral q AM   FLUoxetine   10 mg Oral QPM   insulin  aspart  0-5 Units Subcutaneous QHS   insulin  aspart   0-9 Units Subcutaneous TID WC   loperamide   2 mg Oral Daily   metoprolol  succinate  100 mg Oral BID   pantoprazole   40 mg Oral Daily   potassium chloride   40 mEq Oral Once   pregabalin   50 mg Oral BID   traZODone   25 mg Oral QHS   Continuous Infusions:   PRN Meds:.acetaminophen  **OR** acetaminophen , guaiFENesin , hydrALAZINE , ipratropium-albuterol , loperamide , ondansetron  (ZOFRAN ) IV, senna-docusate   I have personally reviewed following labs and imaging studies  LABORATORY DATA: CBC: Recent Labs  Lab 12/09/23 1601 12/09/23 1629 12/10/23 0525 12/12/23 0549  WBC 11.5*  --  10.1 6.9  NEUTROABS 7.7  --   --   --   HGB 12.9* 13.3 12.3* 11.8*  HCT 40.5 39.0 38.4* 35.5*  MCV 87.3  --  87.3 84.3  PLT 436*  --  388 287    Basic Metabolic Panel: Recent Labs  Lab 12/09/23 1601 12/09/23 1629 12/10/23 0525 12/10/23 1538 12/11/23 0543 12/12/23 0549  NA 137 137 135 135 139 137  K 4.2 4.2 3.8 3.6 3.5 3.3*  CL 104  --  106 105 112* 100  CO2 18*  --  16* 17* 12* 19*  GLUCOSE 83  --  93 113* 92 96  BUN 51*  --  52* 54* 51* 51*  CREATININE 6.07*  --  6.11* 6.41* 6.06* 6.71*  CALCIUM  9.1  --  8.6* 8.6* 7.6* 8.2*  MG  --   --   --   --  1.7 1.8  PHOS  --   --   --   --  4.8*  --     GFR: CrCl cannot be calculated (Unknown ideal weight.).  Liver Function Tests: Recent Labs  Lab 12/09/23 1601 12/12/23 0549  AST 20  --   ALT 10  --   ALKPHOS 95  --   BILITOT 0.6  --   PROT 5.5*  --   ALBUMIN  2.8* 2.5*   No results for input(s): LIPASE, AMYLASE in the last 168 hours. Recent Labs  Lab 12/09/23 1601  AMMONIA 14    Coagulation Profile: No results for input(s): INR, PROTIME in the last 168 hours.  Cardiac Enzymes: No results for input(s): CKTOTAL, CKMB, CKMBINDEX, TROPONINI in the last 168 hours.  BNP (last 3 results) No results for input(s): PROBNP in the last 8760 hours.  Lipid Profile: No results for input(s): CHOL, HDL, LDLCALC,  TRIG, CHOLHDL, LDLDIRECT in the last 72 hours.  Thyroid  Function Tests: No results for input(s): TSH, T4TOTAL, FREET4, T3FREE, THYROIDAB in the last 72 hours.  Anemia Panel: No results for input(s): VITAMINB12, FOLATE, FERRITIN, TIBC, IRON, RETICCTPCT in the last 72 hours.  Urine analysis:    Component Value Date/Time   COLORURINE YELLOW 12/10/2023 0854   APPEARANCEUR CLOUDY (A) 12/10/2023 0854   LABSPEC 1.020 12/10/2023 0854   PHURINE 5.5 12/10/2023 0854   GLUCOSEU NEGATIVE 12/10/2023 0854   HGBUR LARGE (A) 12/10/2023 0854   BILIRUBINUR NEGATIVE 12/10/2023 0854   KETONESUR NEGATIVE 12/10/2023 0854   PROTEINUR 30 (A) 12/10/2023 0854   NITRITE NEGATIVE 12/10/2023 0854   LEUKOCYTESUR SMALL (A) 12/10/2023 0854    Sepsis Labs: Lactic Acid, Venous    Component Value Date/Time   LATICACIDVEN 2.7 (HH) 12/09/2023 1955    MICROBIOLOGY: Recent Results (from the past 240 hours)  Blood culture (routine x 2)     Status: None (Preliminary result)   Collection Time: 12/09/23  4:20 PM   Specimen: BLOOD LEFT FOREARM  Result Value Ref Range Status   Specimen Description BLOOD LEFT FOREARM  Final   Special Requests   Final    BOTTLES DRAWN AEROBIC AND ANAEROBIC Blood Culture adequate volume   Culture   Final    NO GROWTH 3 DAYS Performed at [facility], [address] [place], [place] [address]    Report Status PENDING  Incomplete  Blood culture (routine x 2)     Status: None (Preliminary result)   Collection Time: 12/09/23  4:44 PM   Specimen: BLOOD RIGHT ARM  Result Value Ref Range Status   Specimen Description BLOOD RIGHT ARM  Final   Special Requests   Final    BOTTLES DRAWN AEROBIC AND ANAEROBIC Blood Culture adequate volume   Culture   Final    NO GROWTH 3 DAYS Performed at [facility], [address], [place], [place] [address]    Report Status PENDING  Incomplete  Urine Culture     Status: Abnormal   Collection Time: 12/09/23  10:30 PM   Specimen: Urine, Random  Result Value Ref Range Status   Specimen Description URINE,  RANDOM  Final   Special Requests   Final    NONE Reflexed from [id number] Performed at [facility], [address], [place], [place] [address]    Culture MULTIPLE SPECIES PRESENT, SUGGEST RECOLLECTION (A)  Final   Report Status 12/10/2023 FINAL  Final  C Difficile Quick Screen w PCR reflex     Status: None   Collection Time: 12/10/23  9:50 AM   Specimen: STOOL  Result Value Ref Range Status   C Diff antigen NEGATIVE NEGATIVE Final   C Diff toxin NEGATIVE NEGATIVE Final   C Diff interpretation No C. difficile detected.  Final    Comment: Performed at [facility], [address] [place], [place] [address]  Gastrointestinal Panel by PCR , Stool     Status: None   Collection Time: 12/10/23  1:34 PM   Specimen: Stool  Result Value Ref Range Status   Campylobacter species NOT DETECTED NOT DETECTED Final   Plesimonas shigelloides NOT DETECTED NOT DETECTED Final   Salmonella species NOT DETECTED NOT DETECTED Final   Yersinia enterocolitica NOT DETECTED NOT DETECTED Final   Vibrio species NOT DETECTED NOT DETECTED Final   Vibrio cholerae NOT DETECTED NOT DETECTED Final   Enteroaggregative E coli (EAEC) NOT DETECTED NOT DETECTED Final   Enteropathogenic E coli (EPEC) NOT DETECTED NOT DETECTED Final   Enterotoxigenic E coli (ETEC) NOT DETECTED NOT DETECTED Final   Shiga like toxin producing E coli (STEC) NOT DETECTED NOT DETECTED Final   Shigella/Enteroinvasive E coli (EIEC) NOT DETECTED NOT DETECTED Final   Cryptosporidium NOT DETECTED NOT DETECTED Final   Cyclospora cayetanensis NOT DETECTED NOT DETECTED Final   Entamoeba histolytica NOT DETECTED NOT DETECTED Final   Giardia lamblia NOT DETECTED NOT DETECTED Final   Adenovirus F40/41 NOT DETECTED NOT DETECTED Final   Astrovirus NOT DETECTED NOT DETECTED Final   Norovirus GI/GII NOT DETECTED NOT DETECTED Final   Rotavirus A NOT  DETECTED NOT DETECTED Final   Sapovirus (I, II, IV, and V) NOT DETECTED NOT DETECTED Final    Comment: Performed at [facility], [address] [place], [place] [address]  MRSA Next Gen by PCR, Nasal     Status: None   Collection Time: 12/10/23  5:13 PM   Specimen: Nasal Mucosa; Nasal Swab  Result Value Ref Range Status   MRSA by PCR Next Gen NOT DETECTED NOT DETECTED Final    Comment: (NOTE) The GeneXpert MRSA Assay (FDA approved for NASAL specimens only), is one component of a comprehensive MRSA colonization surveillance program. It is not intended to diagnose MRSA infection nor to guide or monitor treatment for MRSA infections. Test performance is not FDA approved in patients less than 3 years old. Performed at [facility], [address], [place], [place] [address]     RADIOLOGY STUDIES/RESULTS: ECHOCARDIOGRAM COMPLETE Result Date: 12/11/2023    ECHOCARDIOGRAM REPORT   Patient Name:   UMAIR ROSILES Date of Exam: 12/11/2023 Medical Rec #:  [id number]      Height:       69.0 in Accession #:    [id number]     Weight:       272.0 lb Date of Birth:  24-Sep-1940     BSA:          2.355 m Patient Age:    82 years       BP:           122/90 mmHg Patient Gender: M  HR:           92 bpm. Exam Location:  Inpatient Procedure: 2D Echo, Cardiac Doppler and Color Doppler (Both Spectral and Color            Flow Doppler were utilized during procedure). Indications:    Cardiomyopathy  History:        Patient has prior history of Echocardiogram examinations, most                 recent 09/04/2023. Risk Factors:Diabetes.  Sonographer:    Benard Stallion Referring Phys: [id number] BURGESS BROCKS AMIN IMPRESSIONS  1. Left ventricular ejection fraction, by estimation, is 65 to 70%. The left ventricle has normal function. The left ventricle has no regional wall motion abnormalities. Left ventricular diastolic parameters are indeterminate.  2. Right ventricular systolic function is normal. The  right ventricular size is normal.  3. Left atrial size was mildly dilated.  4. Right atrial size was moderately dilated.  5. Mild mitral valve regurgitation.  6. The aortic valve is tricuspid. Aortic valve regurgitation is trivial. Aortic valve sclerosis/calcification is present, without any evidence of aortic stenosis. Comparison(s): The left ventricular function is unchanged. FINDINGS  Left Ventricle: Left ventricular ejection fraction, by estimation, is 65 to 70%. The left ventricle has normal function. The left ventricle has no regional wall motion abnormalities. The left ventricular internal cavity size was normal in size. There is  no left ventricular hypertrophy. Left ventricular diastolic parameters are indeterminate. Right Ventricle: The right ventricular size is normal. Right vetricular wall thickness was not assessed. Right ventricular systolic function is normal. Left Atrium: Left atrial size was mildly dilated. Right Atrium: Right atrial size was moderately dilated. Pericardium: There is no evidence of pericardial effusion. Mitral Valve: There is mild thickening of the mitral valve leaflet(s). Mild mitral annular calcification. Mild mitral valve regurgitation. Tricuspid Valve: The tricuspid valve is normal in structure. Tricuspid valve regurgitation is mild. Aortic Valve: The aortic valve is tricuspid. Aortic valve regurgitation is trivial. Aortic valve sclerosis/calcification is present, without any evidence of aortic stenosis. Aortic valve mean gradient measures 3.0 mmHg. Aortic valve peak gradient measures 5.2 mmHg. Aortic valve area, by VTI measures 3.43 cm. Pulmonic Valve: The pulmonic valve was not well visualized. Pulmonic valve regurgitation is not visualized. Aorta: The aortic root is normal in size and structure. IAS/Shunts: No atrial level shunt detected by color flow Doppler.  LEFT VENTRICLE PLAX 2D LVIDd:         4.90 cm   Diastology LVIDs:         3.55 cm   LV e' medial:    13.80 cm/s LV  PW:         1.00 cm   LV E/e' medial:  8.3 LV IVS:        1.00 cm   LV e' lateral:   12.50 cm/s LVOT diam:     2.30 cm   LV E/e' lateral: 9.1 LV SV:         71 LV SV Index:   30 LVOT Area:     4.15 cm  RIGHT VENTRICLE RV S prime:     9.46 cm/s TAPSE (M-mode): 1.6 cm LEFT ATRIUM             Index        RIGHT ATRIUM           Index LA diam:        4.80 cm 2.04 cm/m   RA Area:  27.40 cm LA Vol (A2C):   82.0 ml 34.83 ml/m  RA Volume:   95.50 ml  40.56 ml/m LA Vol (A4C):   88.9 ml 37.76 ml/m LA Biplane Vol: 91.7 ml 38.95 ml/m  AORTIC VALVE AV Area (Vmax):    3.58 cm AV Area (Vmean):   3.37 cm AV Area (VTI):     3.43 cm AV Vmax:           114.00 cm/s AV Vmean:          80.000 cm/s AV VTI:            0.207 m AV Peak Grad:      5.2 mmHg AV Mean Grad:      3.0 mmHg LVOT Vmax:         98.10 cm/s LVOT Vmean:        64.900 cm/s LVOT VTI:          0.171 m LVOT/AV VTI ratio: 0.83  AORTA Ao Root diam: 3.50 cm MITRAL VALVE                TRICUSPID VALVE MV Area (PHT): 5.02 cm     TR Peak grad:   36.7 mmHg MV Decel Time: 151 msec     TR Vmax:        303.00 cm/s MR Peak grad: 61.2 mmHg MR Vmax:      391.00 cm/s   SHUNTS MV E velocity: 114.00 cm/s  Systemic VTI:  0.17 m MV A velocity: 63.60 cm/s   Systemic Diam: 2.30 cm MV E/A ratio:  1.79 Vina Gull MD Electronically signed by Vina Gull MD Signature Date/Time: 12/11/2023/2:45:34 PM    Final      LOS: 2 days   Donalda Applebaum, MD  [facility]    To contact the attending provider between 7A-7P or the covering provider during after hours 7P-7A, please log into the web site [email or web address] and access using universal Lydia password for that web site. If you do not have the password, please call the hospital operator.  12/12/2023, 12:51 PM

## 2023-12-13 DIAGNOSIS — N179 Acute kidney failure, unspecified: Secondary | ICD-10-CM | POA: Diagnosis not present

## 2023-12-13 DIAGNOSIS — G9341 Metabolic encephalopathy: Secondary | ICD-10-CM | POA: Diagnosis not present

## 2023-12-13 DIAGNOSIS — E8721 Acute metabolic acidosis: Secondary | ICD-10-CM | POA: Diagnosis not present

## 2023-12-13 DIAGNOSIS — A0472 Enterocolitis due to Clostridium difficile, not specified as recurrent: Secondary | ICD-10-CM | POA: Diagnosis not present

## 2023-12-13 LAB — GLUCOSE, CAPILLARY
Glucose-Capillary: 111 mg/dL — ABNORMAL HIGH (ref 70–99)
Glucose-Capillary: 138 mg/dL — ABNORMAL HIGH (ref 70–99)
Glucose-Capillary: 135 mg/dL — ABNORMAL HIGH (ref 70–99)
Glucose-Capillary: 172 mg/dL — ABNORMAL HIGH (ref 70–99)

## 2023-12-13 LAB — RENAL FUNCTION PANEL
Albumin: 2.5 g/dL — ABNORMAL LOW (ref 3.5–5.0)
Anion gap: 13 (ref 5–15)
BUN: 49 mg/dL — ABNORMAL HIGH (ref 8–23)
CO2: 23 mmol/L (ref 22–32)
Calcium: 8.6 mg/dL — ABNORMAL LOW (ref 8.9–10.3)
Chloride: 102 mmol/L (ref 98–111)
Creatinine, Ser: 6.23 mg/dL — ABNORMAL HIGH (ref 0.61–1.24)
GFR, Estimated: 8 mL/min — ABNORMAL LOW (ref >60)
Glucose, Bld: 97 mg/dL (ref 70–99)
Phosphorus: 4.3 mg/dL (ref 2.5–4.6)
Potassium: 3.6 mmol/L (ref 3.5–5.1)
Sodium: 138 mmol/L (ref 135–145)

## 2023-12-13 LAB — CBC
HCT: 37.5 % — ABNORMAL LOW (ref 39.0–52.0)
Hemoglobin: 12.4 g/dL — ABNORMAL LOW (ref 13.0–17.0)
MCH: 27.7 pg (ref 26.0–34.0)
MCHC: 33.1 g/dL (ref 30.0–36.0)
MCV: 83.9 fL (ref 80.0–100.0)
Platelets: 298 K/uL (ref 150–400)
RBC: 4.47 MIL/uL (ref 4.22–5.81)
RDW: 18.6 % — ABNORMAL HIGH (ref 11.5–15.5)
WBC: 7.9 K/uL (ref 4.0–10.5)
nRBC: 0 % (ref 0.0–0.2)

## 2023-12-13 LAB — HEPARIN LEVEL (UNFRACTIONATED): Heparin Unfractionated: >1.1 [IU]/mL — ABNORMAL HIGH (ref 0.30–0.70)

## 2023-12-13 LAB — APTT: aPTT: 89 s — ABNORMAL HIGH (ref 24–36)

## 2023-12-13 LAB — MAGNESIUM: Magnesium: 1.7 mg/dL (ref 1.7–2.4)

## 2023-12-13 MED ORDER — FUROSEMIDE 10 MG/ML IJ SOLN
80.0000 mg | Freq: Once | INTRAMUSCULAR | Status: AC
  Administered 2023-12-13: 80 mg via INTRAVENOUS
  Filled 2023-12-13: qty 8

## 2023-12-13 MED ORDER — POTASSIUM CHLORIDE CRYS ER 20 MEQ PO TBCR
40.0000 meq | EXTENDED_RELEASE_TABLET | Freq: Once | ORAL | Status: AC
  Administered 2023-12-13: 40 meq via ORAL
  Filled 2023-12-13: qty 2

## 2023-12-13 NOTE — Progress Notes (Signed)
 PHARMACY - ANTICOAGULATION CONSULT NOTE  Pharmacy Consult for apixaban >>heparin  Indication: atrial fibrillation  Allergies  Allergen Reactions   Lipitor [Atorvastatin] Other (See Comments)    Myalgias   Zithromax [Azithromycin] Other (See Comments)    GI intolerance   Zocor [Simvastatin] Other (See Comments)    Myalgias    Dificid  [Fidaxomicin ] Rash   Norvasc  [Amlodipine ] Swelling    Patient Measurements: Weight: 124.8 kg (275 lb 2.2 oz)Dosing wt = 100kg  Vital Signs: Temp: 98.1 F (36.7 C) (07/20 0835) Temp Source: Oral (07/20 0835) BP: 111/60 (07/20 0835) Pulse Rate: 84 (07/20 0835)  Labs: Recent Labs    12/11/23 0543 12/12/23 0549 12/13/23 0616  HGB  --  11.8* 12.4*  HCT  --  35.5* 37.5*  PLT  --  287 298  APTT  --   --  89*  HEPARINUNFRC  --   --  >1.10*  CREATININE 6.06* 6.71* 6.23*    Estimated Creatinine Clearance: 12.1 mL/min (A) (by C-G formula based on SCr of 6.23 mg/dL (H)).   Medical History: Past Medical History:  Diagnosis Date   Anemia    Diabetes mellitus type II, controlled (HCC)    with neuropathy   Dysrhythmia    A-Fib. cardioversion done   GERD (gastroesophageal reflux disease)    Hyperlipidemia    Hypertension    Morbid obesity (HCC)    Neuromuscular disorder (HCC)    feet   OSA (obstructive sleep apnea)    On BiPAP at 15/11cm H2O   Prostate cancer (HCC)    Shingles    on face Nov 2010   Urinary incontinence     Medications:  Medications Prior to Admission  Medication Sig Dispense Refill Last Dose/Taking   allopurinol  (ZYLOPRIM ) 100 MG tablet Take 100 mg by mouth in the morning.   12/09/2023   apixaban  (ELIQUIS ) 5 MG TABS tablet Take 5 mg by mouth 2 (two) times daily.   12/09/2023 at  9:42 AM   Cholecalciferol (VITAMIN D) 50 MCG (2000 UT) CAPS Take 2,000 Units by mouth in the morning.   12/09/2023   ezetimibe  (ZETIA ) 10 MG tablet Take 10 mg by mouth in the morning.   12/09/2023   ferrous sulfate 325 (65 FE) MG tablet Take 325  mg by mouth daily with breakfast.   12/09/2023   FLUoxetine  (PROZAC ) 10 MG capsule Take 10 mg by mouth every evening.   12/08/2023   furosemide  (LASIX ) 40 MG tablet Take 40 mg by mouth in the morning.   12/09/2023   guaiFENesin  (MUCINEX ) 600 MG 12 hr tablet Take 600 mg by mouth 2 (two) times daily as needed for cough or to loosen phlegm.   Unknown   insulin  aspart (NOVOLOG ) 100 UNIT/ML injection Inject 0-10 Units into the skin 3 (three) times daily before meals. Sliding scale: if sugar is 150-200 = 2 units, if 201-250 = 4 units, if 251-300 = 6 units, if 301-350 = 8 units, if 351-400 = 10 units, give blood sugar is greater than 400 then call the MD.   12/09/2023 Morning   Insulin  Glargine-Lixisenatide (SOLIQUA) 100-33 UNT-MCG/ML SOPN Inject 22 Units into the skin in the morning.   12/08/2023   Magnesium  Glycinate 100 MG CAPS Take 1 capsule by mouth in the morning and at bedtime.   12/09/2023 Morning   metFORMIN  (GLUCOPHAGE ) 1000 MG tablet Take 1,000 mg by mouth 2 (two) times daily with a meal.  0 12/09/2023 Morning   metoprolol  succinate (TOPROL -XL) 100 MG 24 hr tablet  Take 1 tablet (100 mg total) by mouth 2 (two) times daily. Take with or immediately following a meal.   12/09/2023 Morning   Multiple Vitamins-Minerals (MULTIVITAMIN MEN 50+) TABS Take 1 tablet by mouth every evening.   12/08/2023   Omega-3 Fatty Acids (FISH OIL) 1000 MG CAPS Take 1,000 mg by mouth 2 (two) times daily.   12/09/2023 Morning   omeprazole  (PRILOSEC) 20 MG capsule Take 1 capsule (20 mg total) by mouth daily. (Patient taking differently: Take 20 mg by mouth in the morning.)   12/09/2023   potassium chloride  (KLOR-CON  M) 20 MEQ tablet Please take 40 mEq daily for 5 days and then 20 mEq daily (Patient taking differently: Take 40 mEq by mouth daily.)   12/09/2023   pregabalin  (LYRICA ) 50 MG capsule Take 1 capsule (50 mg total) by mouth 2 (two) times daily. 30 capsule 0 12/09/2023 Morning   traZODone  (DESYREL ) 50 MG tablet Take 25 mg by mouth  at bedtime.   12/09/2023 at  2:09 AM   zinc  oxide 20 % ointment Apply 1 Application topically See admin instructions. Every shift   12/09/2023 Morning   Scheduled:   Chlorhexidine  Gluconate Cloth  6 each Topical Daily   ezetimibe   10 mg Oral q AM   FLUoxetine   10 mg Oral QPM   insulin  aspart  0-5 Units Subcutaneous QHS   insulin  aspart  0-9 Units Subcutaneous TID WC   loperamide   2 mg Oral Daily   metoprolol  succinate  100 mg Oral BID   pantoprazole   40 mg Oral Daily   pregabalin   50 mg Oral BID   sodium bicarbonate   650 mg Oral BID   traZODone   25 mg Oral QHS   Infusions:   heparin  1,500 Units/hr (12/12/23 2125)    Assessment: Pt has been on apixaban  for AF. He will need an HD cath placement soon. Order to bridge with heparin  for now. We will use PTT for now until it correlates with heparin  level.   Scr >5 Hgb ~12 Plt wnl  PTT therapeutic. Heparin  level not correlating yet with recent apixaban . Continue current rate.   Goal of Therapy:  Heparin  level 0.3-0.7 units/ml aPTT 66-102 seconds Monitor platelets by anticoagulation protocol: Yes   Plan:  Heparin  1500 units/hr Daily HL/PTT and CBC  Sergio Batch, PharmD, BCIDP, AAHIVP, CPP Infectious Disease Pharmacist 12/13/2023 10:07 AM

## 2023-12-13 NOTE — Progress Notes (Signed)
 [facility] NEPHROLOGY PROGRESS NOTE  Assessment/ Plan: Pt is a 83 y.o. yo male  with a past medical history of hypertension, DM2, HFpEF, OSA, obesity, hepatocellular carcinoma, history of prostate cancer status post prostatectomy, A-fib on Eliquis , GERD who presents to [facility] with altered mental status and worsening renal function which was done at his nursing facility.   # AKI on CKD3a -baseline seems to be around 1.2-1.3. Suspecting AKI is secondary to prolonged pre-renal injury. No obstruction on CT - Initially treated with IV fluid with worsening renal labs.  The fluid was stopped and treated with Lasix  yesterday with robust urine output of almost 5 L.  Labs are improving.  We will plan for another dose of Lasix  today to offload extra volume. -Continue strict ins and outs, daily lab monitoring.  No need for dialysis today however patient understands that he may  need dialysis if no improvement in renal function in next few days.   # Acute on chronic HFpEF exacerbation -BNP elevated. CXR with vascular congestion.  Difficult to assess volume status because of body habitus.  Treating with IV diuretics.  # Metabolic acidosis -likely secondary to chronic diarrhea, lactic acidosis, AKI.  Treated with oral sodium bicarbonate .  Now Lasix .  # Hypokalemia: Replete potassium chloride .  Follow lab.   #H/o Cdiff  Diarrhea -repeat cdiff negative here.   Discussed with primary team.  Subjective:  Seen and examined.  Urine output is almost 5 L.  Review of system is limited as he is poor historian.  Looks comfortable.  No other new event. Objective Vital signs in last 24 hours: Vitals:   12/12/23 1600 12/12/23 2033 12/13/23 0000 12/13/23 0835  BP: 119/79 110/71 120/79 111/60  Pulse: 78 72 89 84  Resp: 20 17 16 18   Temp: 98.5 F (36.9 C) 98.4 F (36.9 C) 98.2 F (36.8 C) 98.1 F (36.7 C)  TempSrc: Oral Oral Oral Oral  SpO2: 92% 93% 92% 90%  Weight:       Weight change:    Intake/Output Summary (Last 24 hours) at 12/13/2023 1024 Last data filed at 12/13/2023 0847 Gross per 24 hour  Intake 480 ml  Output 5400 ml  Net -4920 ml       Labs: RENAL PANEL Recent Labs  Lab 12/09/23 1601 12/09/23 1629 12/10/23 0525 12/10/23 1538 12/11/23 0543 12/12/23 0549 12/13/23 0616  NA 137   < > 135 135 139 137 138  K 4.2   < > 3.8 3.6 3.5 3.3* 3.6  CL 104  --  106 105 112* 100 102  CO2 18*  --  16* 17* 12* 19* 23  GLUCOSE 83  --  93 113* 92 96 97  BUN 51*  --  52* 54* 51* 51* 49*  CREATININE 6.07*  --  6.11* 6.41* 6.06* 6.71* 6.23*  CALCIUM  9.1  --  8.6* 8.6* 7.6* 8.2* 8.6*  MG  --   --   --   --  1.7 1.8 1.7  PHOS  --   --   --   --  4.8*  --  4.3  ALBUMIN  2.8*  --   --   --   --  2.5* 2.5*   < > = values in this interval not displayed.    Liver Function Tests: Recent Labs  Lab 12/09/23 1601 12/12/23 0549 12/13/23 0616  AST 20  --   --   ALT 10  --   --   ALKPHOS 95  --   --  BILITOT 0.6  --   --   PROT 5.5*  --   --   ALBUMIN  2.8* 2.5* 2.5*   No results for input(s): LIPASE, AMYLASE in the last 168 hours. Recent Labs  Lab 12/09/23 1601  AMMONIA 14   CBC: Recent Labs    12/09/23 1601 12/09/23 1629 12/10/23 0525 12/12/23 0549 12/13/23 0616  HGB 12.9* 13.3 12.3* 11.8* 12.4*  MCV 87.3  --  87.3 84.3 83.9    Cardiac Enzymes: No results for input(s): CKTOTAL, CKMB, CKMBINDEX, TROPONINI in the last 168 hours. CBG: Recent Labs  Lab 12/12/23 0755 12/12/23 1220 12/12/23 1654 12/12/23 2110 12/13/23 0835  GLUCAP 99 126* 125* 176* 111*    Iron Studies: No results for input(s): IRON, TIBC, TRANSFERRIN, FERRITIN in the last 72 hours. Studies/Results: ECHOCARDIOGRAM COMPLETE Result Date: 12/11/2023    ECHOCARDIOGRAM REPORT   Patient Name:   Steve Jensen Date of Exam: 12/11/2023 Medical Rec #:  [id number]      Height:       69.0 in Accession #:    [id number]     Weight:       272.0 lb Date of Birth:  [birth year]      BSA:          2.355 m Patient Age:    82 years       BP:           122/90 mmHg Patient Gender: M              HR:           92 bpm. Exam Location:  Inpatient Procedure: 2D Echo, Cardiac Doppler and Color Doppler (Both Spectral and Color            Flow Doppler were utilized during procedure). Indications:    Cardiomyopathy  History:        Patient has prior history of Echocardiogram examinations, most                 recent 09/04/2023. Risk Factors:Diabetes.  Sonographer:    Benard Stallion Referring Phys: [id number] BURGESS BROCKS AMIN IMPRESSIONS  1. Left ventricular ejection fraction, by estimation, is 65 to 70%. The left ventricle has normal function. The left ventricle has no regional wall motion abnormalities. Left ventricular diastolic parameters are indeterminate.  2. Right ventricular systolic function is normal. The right ventricular size is normal.  3. Left atrial size was mildly dilated.  4. Right atrial size was moderately dilated.  5. Mild mitral valve regurgitation.  6. The aortic valve is tricuspid. Aortic valve regurgitation is trivial. Aortic valve sclerosis/calcification is present, without any evidence of aortic stenosis. Comparison(s): The left ventricular function is unchanged. FINDINGS  Left Ventricle: Left ventricular ejection fraction, by estimation, is 65 to 70%. The left ventricle has normal function. The left ventricle has no regional wall motion abnormalities. The left ventricular internal cavity size was normal in size. There is  no left ventricular hypertrophy. Left ventricular diastolic parameters are indeterminate. Right Ventricle: The right ventricular size is normal. Right vetricular wall thickness was not assessed. Right ventricular systolic function is normal. Left Atrium: Left atrial size was mildly dilated. Right Atrium: Right atrial size was moderately dilated. Pericardium: There is no evidence of pericardial effusion. Mitral Valve: There is mild thickening of the mitral valve  leaflet(s). Mild mitral annular calcification. Mild mitral valve regurgitation. Tricuspid Valve: The tricuspid valve is normal in structure. Tricuspid valve regurgitation is mild. Aortic Valve: The aortic valve is  tricuspid. Aortic valve regurgitation is trivial. Aortic valve sclerosis/calcification is present, without any evidence of aortic stenosis. Aortic valve mean gradient measures 3.0 mmHg. Aortic valve peak gradient measures 5.2 mmHg. Aortic valve area, by VTI measures 3.43 cm. Pulmonic Valve: The pulmonic valve was not well visualized. Pulmonic valve regurgitation is not visualized. Aorta: The aortic root is normal in size and structure. IAS/Shunts: No atrial level shunt detected by color flow Doppler.  LEFT VENTRICLE PLAX 2D LVIDd:         4.90 cm   Diastology LVIDs:         3.55 cm   LV e' medial:    13.80 cm/s LV PW:         1.00 cm   LV E/e' medial:  8.3 LV IVS:        1.00 cm   LV e' lateral:   12.50 cm/s LVOT diam:     2.30 cm   LV E/e' lateral: 9.1 LV SV:         71 LV SV Index:   30 LVOT Area:     4.15 cm  RIGHT VENTRICLE RV S prime:     9.46 cm/s TAPSE (M-mode): 1.6 cm LEFT ATRIUM             Index        RIGHT ATRIUM           Index LA diam:        4.80 cm 2.04 cm/m   RA Area:     27.40 cm LA Vol (A2C):   82.0 ml 34.83 ml/m  RA Volume:   95.50 ml  40.56 ml/m LA Vol (A4C):   88.9 ml 37.76 ml/m LA Biplane Vol: 91.7 ml 38.95 ml/m  AORTIC VALVE AV Area (Vmax):    3.58 cm AV Area (Vmean):   3.37 cm AV Area (VTI):     3.43 cm AV Vmax:           114.00 cm/s AV Vmean:          80.000 cm/s AV VTI:            0.207 m AV Peak Grad:      5.2 mmHg AV Mean Grad:      3.0 mmHg LVOT Vmax:         98.10 cm/s LVOT Vmean:        64.900 cm/s LVOT VTI:          0.171 m LVOT/AV VTI ratio: 0.83  AORTA Ao Root diam: 3.50 cm MITRAL VALVE                TRICUSPID VALVE MV Area (PHT): 5.02 cm     TR Peak grad:   36.7 mmHg MV Decel Time: 151 msec     TR Vmax:        303.00 cm/s MR Peak grad: 61.2 mmHg MR Vmax:       391.00 cm/s   SHUNTS MV E velocity: 114.00 cm/s  Systemic VTI:  0.17 m MV A velocity: 63.60 cm/s   Systemic Diam: 2.30 cm MV E/A ratio:  1.79 Vina Gull MD Electronically signed by Vina Gull MD Signature Date/Time: 12/11/2023/2:45:34 PM    Final     Medications: Infusions:  heparin  1,500 Units/hr (12/12/23 2125)     Scheduled Medications:  Chlorhexidine  Gluconate Cloth  6 each Topical Daily   ezetimibe   10 mg Oral q AM   FLUoxetine   10 mg Oral QPM  insulin  aspart  0-5 Units Subcutaneous QHS   insulin  aspart  0-9 Units Subcutaneous TID WC   loperamide   2 mg Oral Daily   metoprolol  succinate  100 mg Oral BID   pantoprazole   40 mg Oral Daily   pregabalin   50 mg Oral BID   sodium bicarbonate   650 mg Oral BID   traZODone   25 mg Oral QHS    have reviewed scheduled and prn medications.  Physical Exam: General: Sitting on bed, alert and awake. Heart:RRR, s1s2 nl Lungs: Bibasilar reduced breath sound Abdomen:soft, Non-tender, non-distended Extremities: Bilateral leg pitting edema present+ Neurology: Alert awake and following commands.  Neno Hohensee Prasad Mariah Gerstenberger 12/13/2023,10:24 AM  LOS: 3 days

## 2023-12-13 NOTE — Plan of Care (Signed)

## 2023-12-13 NOTE — Progress Notes (Signed)
 PROGRESS NOTE        PATIENT DETAILS Name: Steve Jensen Age: 83 y.o. Sex: male Date of Birth: 08-Oct-1940 Admit Date: 12/09/2023 Admitting Physician Editha Ram, MD ERE:[email or web address], Provider Not In  Brief Summary: Patient is a 83 y.o.  male with history of A-fib, HTN, chronic HFrEF, PAF, CKD stage IIIa, hepatocellular CA, prostate CA, OSA on CPAP-presented to the hospital with confusion-found to have AKI.  Significant events: 7/17>> admit to [facility].  Significant studies: 7/16>>CT Head:no acute intracranial pathology 7/16>>CT Renal Stone study: No hydronephrosis. 7/16>> CXR: No PNA  Significant microbiology data: 7/16>> urine culture: Multiple species. 7/16>> blood culture: No growth 7/17>> GI pathogen panel: Negative 7/17>> stool C. difficile PCR: Negative  Procedures: None  Consults: Renal  Subjective: Feels thirsty-no other complaints.  Objective: Vitals: Blood pressure 111/60, pulse 84, temperature 98.1 F (36.7 C), temperature source Oral, resp. rate 18, weight 124.8 kg, SpO2 90%.   Exam: Awake/alert Chest: Clear to auscultation Abdomen: Soft nontender nondistended Extremities:++ Edema Neuro: nonfocal exam.  Pertinent Labs/Radiology:    Latest Ref Rng & Units 12/13/2023    6:16 AM 12/12/2023    5:49 AM 12/10/2023    5:25 AM  CBC  WBC 4.0 - 10.5 K/uL 7.9  6.9  10.1   Hemoglobin 13.0 - 17.0 g/dL 87.5  88.1  87.6   Hematocrit 39.0 - 52.0 % 37.5  35.5  38.4   Platelets 150 - 400 K/uL 298  287  388     Lab Results  Component Value Date   NA 138 12/13/2023   K 3.6 12/13/2023   CL 102 12/13/2023   CO2 23 12/13/2023      Assessment/Plan: AKI on CKD stage IIIa AKI likely hemodynamically mediated in the setting of diarrhea/diuretic/metformin  use. No significant improvement after IV fluid hydration-since developed volume overload-no longer needed IV fluids-and now on IV Lasix . Thankfully renal function seems to have  plateaued-hopefully will improve from urine output-no emergent indications for HD at this point-has had good response to IV Lasix  with great urine output overnight. Continue to avoid nephrotoxic agents Await further recommendations from nephrology.    Non-anion gap metabolic acidosis Due to combination of diarrhea and AKI Improved-briefly required bicarb infusion Now on oral bicarb.  Hypokalemia Repleted  Acute metabolic encephalopathy Secondary to AKI Improved-answering questions appropriately.  Not clear if he is back to his baseline-no family at bedside.  Acute on chronic HFpEF More volume overloaded-suspect this is primarily due to IV fluid resuscitation Leg edema unchanged this morning On IV diuretics per nephrology Continue to follow electrolytes  Chronic atrial fibrillation Rate controlled Telemetry monitoring Continue metoprolol  Eliquis  held-and on IV heparin -in case he requires insertion of dialysis catheter.   ?  UTI Has no symptoms-2 samples of UA with discordant results Since not symptomatic-has recent diagnosis of C. difficile-antibiotics stopped on 7/18-and being monitored off antibiotics.    Chronic diarrhea Ongoing for the past 3-4 months Repeat stool studies negative for C. difficile/infection Diarrhea seems to have slowed down quite a bit after as needed Imodium . Continue to watch closely-once renal function somewhat stabilizes further-and if diarrhea worsens/reoccurs-May need GI evaluation at some point.  Recent history of C. difficile diarrhea Completed course of oral vancomycin -repeat stool studies negative.  HLD Zetia   DM-2 (A1c 7.5 on 4/11) CBG stable SSI for now.   Recent Labs  12/12/23 1654 12/12/23 2110 12/13/23 0835  GLUCAP 125* 176* 111*    Peripheral neuropathy Lyrica .  History of hepatocellular carcinoma History of prostate cancer-s/p prostatectomy Outpatient oncology/urology follow-up.  Recent history of L4 vertebral body  compression fracture Repeat CT this admit-with lytic component-with concern for metastatic disease. Continue TSLO brace-outpatient neurosurgical follow-up  Mood disorder Relatively stable Continue Prozac /trazodone .  OSA CPAP nightly  Class 3 Obesity: Estimated body mass index is 40.63 kg/m as calculated from the following:   Height as of 11/10/23: 5' 9 (1.753 m).   Weight as of this encounter: 124.8 kg.   Code status:   Code Status: Limited: Do not attempt resuscitation (DNR) -DNR-LIMITED -Do Not Intubate/DNI    DVT Prophylaxis:     Family Communication: Daughter-Beverly-[phone] updated on 7/20   Disposition Plan: Status is: Inpatient Remains inpatient appropriate because: Severity of illness   Planned Discharge Destination:Skilled nursing facility   Diet: Diet Order             Diet heart healthy/carb modified Room service appropriate? Yes; Fluid consistency: Thin  Diet effective now                     Antimicrobial agents: Anti-infectives (From admission, onward)    Start     Dose/Rate Route Frequency Ordered Stop   12/10/23 2300  cefTRIAXone  (ROCEPHIN ) 1 g in sodium chloride  0.9 % 100 mL IVPB  Status:  Discontinued        1 g 200 mL/hr over 30 Minutes Intravenous Every 24 hours 12/10/23 0353 12/11/23 1013   12/09/23 2300  cefTRIAXone  (ROCEPHIN ) 2 g in sodium chloride  0.9 % 100 mL IVPB        2 g 200 mL/hr over 30 Minutes Intravenous  Once 12/09/23 2249 12/10/23 0117        MEDICATIONS: Scheduled Meds:  Chlorhexidine  Gluconate Cloth  6 each Topical Daily   ezetimibe   10 mg Oral q AM   FLUoxetine   10 mg Oral QPM   insulin  aspart  0-5 Units Subcutaneous QHS   insulin  aspart  0-9 Units Subcutaneous TID WC   loperamide   2 mg Oral Daily   metoprolol  succinate  100 mg Oral BID   pantoprazole   40 mg Oral Daily   pregabalin   50 mg Oral BID   sodium bicarbonate   650 mg Oral BID   traZODone   25 mg Oral QHS   Continuous Infusions:  heparin   1,500 Units/hr (12/12/23 2125)    PRN Meds:.acetaminophen  **OR** acetaminophen , guaiFENesin , hydrALAZINE , ipratropium-albuterol , loperamide , ondansetron  (ZOFRAN ) IV, senna-docusate   I have personally reviewed following labs and imaging studies  LABORATORY DATA: CBC: Recent Labs  Lab 12/09/23 1601 12/09/23 1629 12/10/23 0525 12/12/23 0549 12/13/23 0616  WBC 11.5*  --  10.1 6.9 7.9  NEUTROABS 7.7  --   --   --   --   HGB 12.9* 13.3 12.3* 11.8* 12.4*  HCT 40.5 39.0 38.4* 35.5* 37.5*  MCV 87.3  --  87.3 84.3 83.9  PLT 436*  --  388 287 298    Basic Metabolic Panel: Recent Labs  Lab 12/10/23 0525 12/10/23 1538 12/11/23 0543 12/12/23 0549 12/13/23 0616  NA 135 135 139 137 138  K 3.8 3.6 3.5 3.3* 3.6  CL 106 105 112* 100 102  CO2 16* 17* 12* 19* 23  GLUCOSE 93 113* 92 96 97  BUN 52* 54* 51* 51* 49*  CREATININE 6.11* 6.41* 6.06* 6.71* 6.23*  CALCIUM  8.6* 8.6* 7.6* 8.2* 8.6*  MG  --   --  1.7 1.8 1.7  PHOS  --   --  4.8*  --  4.3    GFR: Estimated Creatinine Clearance: 12.1 mL/min (A) (by C-G formula based on SCr of 6.23 mg/dL (H)).  Liver Function Tests: Recent Labs  Lab 12/09/23 1601 12/12/23 0549 12/13/23 0616  AST 20  --   --   ALT 10  --   --   ALKPHOS 95  --   --   BILITOT 0.6  --   --   PROT 5.5*  --   --   ALBUMIN  2.8* 2.5* 2.5*   No results for input(s): LIPASE, AMYLASE in the last 168 hours. Recent Labs  Lab 12/09/23 1601  AMMONIA 14    Coagulation Profile: No results for input(s): INR, PROTIME in the last 168 hours.  Cardiac Enzymes: No results for input(s): CKTOTAL, CKMB, CKMBINDEX, TROPONINI in the last 168 hours.  BNP (last 3 results) No results for input(s): PROBNP in the last 8760 hours.  Lipid Profile: No results for input(s): CHOL, HDL, LDLCALC, TRIG, CHOLHDL, LDLDIRECT in the last 72 hours.  Thyroid  Function Tests: No results for input(s): TSH, T4TOTAL, FREET4, T3FREE, THYROIDAB in the  last 72 hours.  Anemia Panel: No results for input(s): VITAMINB12, FOLATE, FERRITIN, TIBC, IRON, RETICCTPCT in the last 72 hours.  Urine analysis:    Component Value Date/Time   COLORURINE YELLOW 12/10/2023 0854   APPEARANCEUR CLOUDY (A) 12/10/2023 0854   LABSPEC 1.020 12/10/2023 0854   PHURINE 5.5 12/10/2023 0854   GLUCOSEU NEGATIVE 12/10/2023 0854   HGBUR LARGE (A) 12/10/2023 0854   BILIRUBINUR NEGATIVE 12/10/2023 0854   KETONESUR NEGATIVE 12/10/2023 0854   PROTEINUR 30 (A) 12/10/2023 0854   NITRITE NEGATIVE 12/10/2023 0854   LEUKOCYTESUR SMALL (A) 12/10/2023 0854    Sepsis Labs: Lactic Acid, Venous    Component Value Date/Time   LATICACIDVEN 2.7 (HH) 12/09/2023 1955    MICROBIOLOGY: Recent Results (from the past 240 hours)  Blood culture (routine x 2)     Status: None (Preliminary result)   Collection Time: 12/09/23  4:20 PM   Specimen: BLOOD LEFT FOREARM  Result Value Ref Range Status   Specimen Description BLOOD LEFT FOREARM  Final   Special Requests   Final    BOTTLES DRAWN AEROBIC AND ANAEROBIC Blood Culture adequate volume   Culture   Final    NO GROWTH 4 DAYS Performed at [facility], [address] [place], [place] [address]    Report Status PENDING  Incomplete  Blood culture (routine x 2)     Status: None (Preliminary result)   Collection Time: 12/09/23  4:44 PM   Specimen: BLOOD RIGHT ARM  Result Value Ref Range Status   Specimen Description BLOOD RIGHT ARM  Final   Special Requests   Final    BOTTLES DRAWN AEROBIC AND ANAEROBIC Blood Culture adequate volume   Culture   Final    NO GROWTH 4 DAYS Performed at [facility], [address], [place], [place] [address]    Report Status PENDING  Incomplete  Urine Culture     Status: Abnormal   Collection Time: 12/09/23 10:30 PM   Specimen: Urine, Random  Result Value Ref Range Status   Specimen Description URINE, RANDOM  Final   Special Requests   Final    NONE Reflexed from  [id number] Performed at [facility], [address], [place], [place] [address]  Culture MULTIPLE SPECIES PRESENT, SUGGEST RECOLLECTION (A)  Final   Report Status 12/10/2023 FINAL  Final  C Difficile Quick Screen w PCR reflex     Status: None   Collection Time: 12/10/23  9:50 AM   Specimen: STOOL  Result Value Ref Range Status   C Diff antigen NEGATIVE NEGATIVE Final   C Diff toxin NEGATIVE NEGATIVE Final   C Diff interpretation No C. difficile detected.  Final    Comment: Performed at [facility], [address], [place], [place] [address]  Gastrointestinal Panel by PCR , Stool     Status: None   Collection Time: 12/10/23  1:34 PM   Specimen: Stool  Result Value Ref Range Status   Campylobacter species NOT DETECTED NOT DETECTED Final   Plesimonas shigelloides NOT DETECTED NOT DETECTED Final   Salmonella species NOT DETECTED NOT DETECTED Final   Yersinia enterocolitica NOT DETECTED NOT DETECTED Final   Vibrio species NOT DETECTED NOT DETECTED Final   Vibrio cholerae NOT DETECTED NOT DETECTED Final   Enteroaggregative E coli (EAEC) NOT DETECTED NOT DETECTED Final   Enteropathogenic E coli (EPEC) NOT DETECTED NOT DETECTED Final   Enterotoxigenic E coli (ETEC) NOT DETECTED NOT DETECTED Final   Shiga like toxin producing E coli (STEC) NOT DETECTED NOT DETECTED Final   Shigella/Enteroinvasive E coli (EIEC) NOT DETECTED NOT DETECTED Final   Cryptosporidium NOT DETECTED NOT DETECTED Final   Cyclospora cayetanensis NOT DETECTED NOT DETECTED Final   Entamoeba histolytica NOT DETECTED NOT DETECTED Final   Giardia lamblia NOT DETECTED NOT DETECTED Final   Adenovirus F40/41 NOT DETECTED NOT DETECTED Final   Astrovirus NOT DETECTED NOT DETECTED Final   Norovirus GI/GII NOT DETECTED NOT DETECTED Final   Rotavirus A NOT DETECTED NOT DETECTED Final   Sapovirus (I, II, IV, and V) NOT DETECTED NOT DETECTED Final    Comment: Performed at [facility], [address]  [place], [place] [address]  MRSA Next Gen by PCR, Nasal     Status: None   Collection Time: 12/10/23  5:13 PM   Specimen: Nasal Mucosa; Nasal Swab  Result Value Ref Range Status   MRSA by PCR Next Gen NOT DETECTED NOT DETECTED Final    Comment: (NOTE) The GeneXpert MRSA Assay (FDA approved for NASAL specimens only), is one component of a comprehensive MRSA colonization surveillance program. It is not intended to diagnose MRSA infection nor to guide or monitor treatment for MRSA infections. Test performance is not FDA approved in patients less than 53 years old. Performed at [facility], [address], [place], [place] [address]     RADIOLOGY STUDIES/RESULTS: ECHOCARDIOGRAM COMPLETE Result Date: 12/11/2023    ECHOCARDIOGRAM REPORT   Patient Name:   Steve Jensen Date of Exam: 12/11/2023 Medical Rec #:  [id number]      Height:       69.0 in Accession #:    [id number]     Weight:       272.0 lb Date of Birth:  [birth year]     BSA:          2.355 m Patient Age:    82 years       BP:           122/90 mmHg Patient Gender: M              HR:           92 bpm. Exam Location:  Inpatient Procedure: 2D Echo,  Cardiac Doppler and Color Doppler (Both Spectral and Color            Flow Doppler were utilized during procedure). Indications:    Cardiomyopathy  History:        Patient has prior history of Echocardiogram examinations, most                 recent 09/04/2023. Risk Factors:Diabetes.  Sonographer:    Benard Stallion Referring Phys: [id number] BURGESS BROCKS AMIN IMPRESSIONS  1. Left ventricular ejection fraction, by estimation, is 65 to 70%. The left ventricle has normal function. The left ventricle has no regional wall motion abnormalities. Left ventricular diastolic parameters are indeterminate.  2. Right ventricular systolic function is normal. The right ventricular size is normal.  3. Left atrial size was mildly dilated.  4. Right atrial size was moderately dilated.  5. Mild mitral valve regurgitation.  6.  The aortic valve is tricuspid. Aortic valve regurgitation is trivial. Aortic valve sclerosis/calcification is present, without any evidence of aortic stenosis. Comparison(s): The left ventricular function is unchanged. FINDINGS  Left Ventricle: Left ventricular ejection fraction, by estimation, is 65 to 70%. The left ventricle has normal function. The left ventricle has no regional wall motion abnormalities. The left ventricular internal cavity size was normal in size. There is  no left ventricular hypertrophy. Left ventricular diastolic parameters are indeterminate. Right Ventricle: The right ventricular size is normal. Right vetricular wall thickness was not assessed. Right ventricular systolic function is normal. Left Atrium: Left atrial size was mildly dilated. Right Atrium: Right atrial size was moderately dilated. Pericardium: There is no evidence of pericardial effusion. Mitral Valve: There is mild thickening of the mitral valve leaflet(s). Mild mitral annular calcification. Mild mitral valve regurgitation. Tricuspid Valve: The tricuspid valve is normal in structure. Tricuspid valve regurgitation is mild. Aortic Valve: The aortic valve is tricuspid. Aortic valve regurgitation is trivial. Aortic valve sclerosis/calcification is present, without any evidence of aortic stenosis. Aortic valve mean gradient measures 3.0 mmHg. Aortic valve peak gradient measures 5.2 mmHg. Aortic valve area, by VTI measures 3.43 cm. Pulmonic Valve: The pulmonic valve was not well visualized. Pulmonic valve regurgitation is not visualized. Aorta: The aortic root is normal in size and structure. IAS/Shunts: No atrial level shunt detected by color flow Doppler.  LEFT VENTRICLE PLAX 2D LVIDd:         4.90 cm   Diastology LVIDs:         3.55 cm   LV e' medial:    13.80 cm/s LV PW:         1.00 cm   LV E/e' medial:  8.3 LV IVS:        1.00 cm   LV e' lateral:   12.50 cm/s LVOT diam:     2.30 cm   LV E/e' lateral: 9.1 LV SV:         71 LV  SV Index:   30 LVOT Area:     4.15 cm  RIGHT VENTRICLE RV S prime:     9.46 cm/s TAPSE (M-mode): 1.6 cm LEFT ATRIUM             Index        RIGHT ATRIUM           Index LA diam:        4.80 cm 2.04 cm/m   RA Area:     27.40 cm LA Vol (A2C):   82.0 ml 34.83 ml/m  RA Volume:   95.50  ml  40.56 ml/m LA Vol (A4C):   88.9 ml 37.76 ml/m LA Biplane Vol: 91.7 ml 38.95 ml/m  AORTIC VALVE AV Area (Vmax):    3.58 cm AV Area (Vmean):   3.37 cm AV Area (VTI):     3.43 cm AV Vmax:           114.00 cm/s AV Vmean:          80.000 cm/s AV VTI:            0.207 m AV Peak Grad:      5.2 mmHg AV Mean Grad:      3.0 mmHg LVOT Vmax:         98.10 cm/s LVOT Vmean:        64.900 cm/s LVOT VTI:          0.171 m LVOT/AV VTI ratio: 0.83  AORTA Ao Root diam: 3.50 cm MITRAL VALVE                TRICUSPID VALVE MV Area (PHT): 5.02 cm     TR Peak grad:   36.7 mmHg MV Decel Time: 151 msec     TR Vmax:        303.00 cm/s MR Peak grad: 61.2 mmHg MR Vmax:      391.00 cm/s   SHUNTS MV E velocity: 114.00 cm/s  Systemic VTI:  0.17 m MV A velocity: 63.60 cm/s   Systemic Diam: 2.30 cm MV E/A ratio:  1.79 Vina Gull MD Electronically signed by Vina Gull MD Signature Date/Time: 12/11/2023/2:45:34 PM    Final      LOS: 3 days   Donalda Applebaum, MD  [facility]    To contact the attending provider between 7A-7P or the covering provider during after hours 7P-7A, please log into the web site [email or web address] and access using universal [place] password for that web site. If you do not have the password, please call the hospital operator.  12/13/2023, 9:52 AM

## 2023-12-14 DIAGNOSIS — N179 Acute kidney failure, unspecified: Secondary | ICD-10-CM | POA: Diagnosis not present

## 2023-12-14 DIAGNOSIS — G9341 Metabolic encephalopathy: Secondary | ICD-10-CM | POA: Diagnosis not present

## 2023-12-14 DIAGNOSIS — A0472 Enterocolitis due to Clostridium difficile, not specified as recurrent: Secondary | ICD-10-CM | POA: Diagnosis not present

## 2023-12-14 DIAGNOSIS — E8721 Acute metabolic acidosis: Secondary | ICD-10-CM | POA: Diagnosis not present

## 2023-12-14 LAB — CBC
HCT: 36.5 % — ABNORMAL LOW (ref 39.0–52.0)
Hemoglobin: 12.1 g/dL — ABNORMAL LOW (ref 13.0–17.0)
MCH: 27.6 pg (ref 26.0–34.0)
MCHC: 33.2 g/dL (ref 30.0–36.0)
MCV: 83.3 fL (ref 80.0–100.0)
Platelets: 292 K/uL (ref 150–400)
RBC: 4.38 MIL/uL (ref 4.22–5.81)
RDW: 18.1 % — ABNORMAL HIGH (ref 11.5–15.5)
WBC: 7.6 K/uL (ref 4.0–10.5)
nRBC: 0 % (ref 0.0–0.2)

## 2023-12-14 LAB — CULTURE, BLOOD (ROUTINE X 2)
Culture: NO GROWTH
Culture: NO GROWTH
Special Requests: ADEQUATE
Special Requests: ADEQUATE

## 2023-12-14 LAB — RENAL FUNCTION PANEL
Albumin: 2.6 g/dL — ABNORMAL LOW (ref 3.5–5.0)
Anion gap: 14 (ref 5–15)
BUN: 46 mg/dL — ABNORMAL HIGH (ref 8–23)
CO2: 26 mmol/L (ref 22–32)
Calcium: 8.5 mg/dL — ABNORMAL LOW (ref 8.9–10.3)
Chloride: 99 mmol/L (ref 98–111)
Creatinine, Ser: 5.41 mg/dL — ABNORMAL HIGH (ref 0.61–1.24)
GFR, Estimated: 10 mL/min — ABNORMAL LOW (ref >60)
Glucose, Bld: 111 mg/dL — ABNORMAL HIGH (ref 70–99)
Phosphorus: 4.1 mg/dL (ref 2.5–4.6)
Potassium: 3.6 mmol/L (ref 3.5–5.1)
Sodium: 139 mmol/L (ref 135–145)

## 2023-12-14 LAB — APTT: aPTT: 73 s — ABNORMAL HIGH (ref 24–36)

## 2023-12-14 LAB — GLUCOSE, CAPILLARY
Glucose-Capillary: 107 mg/dL — ABNORMAL HIGH (ref 70–99)
Glucose-Capillary: 158 mg/dL — ABNORMAL HIGH (ref 70–99)
Glucose-Capillary: 162 mg/dL — ABNORMAL HIGH (ref 70–99)
Glucose-Capillary: 120 mg/dL — ABNORMAL HIGH (ref 70–99)

## 2023-12-14 LAB — HEPARIN LEVEL (UNFRACTIONATED): Heparin Unfractionated: >1.1 [IU]/mL — ABNORMAL HIGH (ref 0.30–0.70)

## 2023-12-14 LAB — MAGNESIUM: Magnesium: 1.7 mg/dL (ref 1.7–2.4)

## 2023-12-14 MED ORDER — FUROSEMIDE 10 MG/ML IJ SOLN
80.0000 mg | Freq: Once | INTRAMUSCULAR | Status: AC
  Administered 2023-12-14: 80 mg via INTRAVENOUS
  Filled 2023-12-14: qty 8

## 2023-12-14 NOTE — Plan of Care (Signed)

## 2023-12-14 NOTE — Progress Notes (Signed)
 PHARMACY - ANTICOAGULATION CONSULT NOTE  Pharmacy Consult for apixaban >>heparin  Indication: atrial fibrillation  Allergies  Allergen Reactions   Lipitor [Atorvastatin] Other (See Comments)    Myalgias   Zithromax [Azithromycin] Other (See Comments)    GI intolerance   Zocor [Simvastatin] Other (See Comments)    Myalgias    Dificid  [Fidaxomicin ] Rash   Norvasc  [Amlodipine ] Swelling    Patient Measurements: Weight: 124.8 kg (275 lb 2.2 oz)Dosing wt = 100kg  Vital Signs: Temp: 98.3 F (36.8 C) (07/21 1215) Temp Source: Oral (07/21 1215) BP: 116/73 (07/21 1215) Pulse Rate: 82 (07/21 1215)  Labs: Recent Labs    12/12/23 0549 12/13/23 0616 12/14/23 0610 12/14/23 1414  HGB 11.8* 12.4* 12.1*  --   HCT 35.5* 37.5* 36.5*  --   PLT 287 298 292  --   APTT  --  89*  --  73*  HEPARINUNFRC  --  >1.10*  --  >1.10*  CREATININE 6.71* 6.23* 5.41*  --     Estimated Creatinine Clearance: 14 mL/min (A) (by C-G formula based on SCr of 5.41 mg/dL (H)).   Medical History: Past Medical History:  Diagnosis Date   Anemia    Diabetes mellitus type II, controlled (HCC)    with neuropathy   Dysrhythmia    A-Fib. cardioversion done   GERD (gastroesophageal reflux disease)    Hyperlipidemia    Hypertension    Morbid obesity (HCC)    Neuromuscular disorder (HCC)    feet   OSA (obstructive sleep apnea)    On BiPAP at 15/11cm H2O   Prostate cancer (HCC)    Shingles    on face Nov 2010   Urinary incontinence      Assessment: Pt has been on apixaban  for AF. He will need an HD cath placement soon. Order to bridge with heparin  for now. We will use PTT for now until it correlates with heparin  level.   PTT therapeutic  Goal of Therapy:  Heparin  level 0.3-0.7 units/ml aPTT 66-102 seconds Monitor platelets by anticoagulation protocol: Yes   Plan:  Heparin  1500 units/hr Daily HL/PTT and CBC  Thank you. Olam Monte, PharmD 12/14/2023 3:24 PM

## 2023-12-14 NOTE — Care Management Important Message (Signed)
 Important Message  Patient Details  Name: Steve Jensen MRN: [id number] Date of Birth: [birth year]   Important Message Given:  Yes - Medicare IM     Claretta Deed 12/14/2023, 4:37 PM

## 2023-12-14 NOTE — Progress Notes (Addendum)
 PROGRESS NOTE        PATIENT DETAILS Name: Steve Jensen Age: 83 y.o. Sex: male Date of Birth: Jun 17, 1940 Admit Date: 12/09/2023 Admitting Physician Editha Ram, MD ERE:[email or web address], Provider Not In  Brief Summary: Patient is a 83 y.o.  male with history of A-fib, HTN, chronic HFrEF, PAF, CKD stage IIIa, hepatocellular CA, prostate CA, OSA on CPAP-presented to the hospital with confusion-found to have AKI.  Significant events: 7/17>> admit to [facility].  Significant studies: 7/16>>CT Head:no acute intracranial pathology 7/16>>CT Renal Stone study: No hydronephrosis. 7/16>> CXR: No PNA  Significant microbiology data: 7/16>> urine culture: Multiple species. 7/16>> blood culture: No growth 7/17>> GI pathogen panel: Negative 7/17>> stool C. difficile PCR: Negative  Procedures: None  Consults: Renal  Subjective: Sleeping comfortably-no major issues overnight.  Diarrhea has remarkably improved per nursing staff-rectal tube was discontinued yesterday.  Objective: Vitals: Blood pressure 113/60, pulse 76, temperature (!) 97.4 F (36.3 C), temperature source Axillary, resp. rate 17, weight 124.8 kg, SpO2 90%.   Exam: Sleeping but easily awakes-answers questions appropriately-follows commands. Chest: Clear to auscultation CVS: S1-S2 Abdomen: Soft nontender nondistended Extremities:++ Edema.  Pertinent Labs/Radiology:    Latest Ref Rng & Units 12/14/2023    6:10 AM 12/13/2023    6:16 AM 12/12/2023    5:49 AM  CBC  WBC 4.0 - 10.5 K/uL 7.6  7.9  6.9   Hemoglobin 13.0 - 17.0 g/dL 87.8  87.5  88.1   Hematocrit 39.0 - 52.0 % 36.5  37.5  35.5   Platelets 150 - 400 K/uL 292  298  287     Lab Results  Component Value Date   NA 139 12/14/2023   K 3.6 12/14/2023   CL 99 12/14/2023   CO2 26 12/14/2023      Assessment/Plan: AKI on CKD stage IIIa AKI likely hemodynamically mediated in the setting of diarrhea/diuretic/metformin  use. Thankfully with  supportive care-renal function plateaued and now has started to improve. Remains volume overloaded-nephrology service following and directing IV diuretic regimen. Continue to avoid nephrotoxic agents Await further recommendations from nephrology.      Non-anion gap metabolic acidosis Due to combination of diarrhea and AKI Improved-briefly required bicarb infusion Now on oral bicarb.  Hypokalemia Repleted  Acute metabolic encephalopathy Secondary to AKI Improved-answering questions appropriately.  Not clear if he is back to his baseline-no family at bedside.  Acute on chronic HFpEF More volume overloaded-suspect this is primarily due to IV fluid resuscitation Leg edema unchanged this morning On IV diuretics per nephrology Continue to follow electrolytes  Chronic atrial fibrillation Rate controlled Telemetry monitoring Continue metoprolol  Eliquis  held-and on IV heparin -in case he requires insertion of dialysis catheter.  ?  UTI Has no symptoms-2 samples of UA with discordant results Since not symptomatic-has recent diagnosis of C. difficile-antibiotics stopped on 7/18-and being monitored off antibiotics.    Chronic diarrhea Ongoing for the past 3-4 months Repeat stool studies negative for C. difficile/infection Significant improvement in diarrhea with just Imodium  Due to improvement-suspect we can hold off on GI evaluation at this point.   Recent history of C. difficile diarrhea Completed course of oral vancomycin -repeat stool studies negative. Minimize use of antibiotics as much as possible  HLD Zetia   DM-2 (A1c 7.5 on 4/11) CBG stable SSI for now.   Recent Labs    12/13/23 1538 12/13/23 2106 12/14/23 0801  GLUCAP  135* 172* 107*    Peripheral neuropathy Lyrica .  History of hepatocellular carcinoma History of prostate cancer-s/p prostatectomy Outpatient oncology/urology follow-up.  Recent history of L4 vertebral body compression fracture Repeat CT this  admit-with lytic component-with concern for metastatic disease. Continue TSLO brace-outpatient neurosurgical follow-up  Mood disorder Relatively stable Continue Prozac /trazodone .  OSA CPAP nightly  Class 3 Obesity: Estimated body mass index is 40.63 kg/m as calculated from the following:   Height as of 11/10/23: 5' 9 (1.753 m).   Weight as of this encounter: 124.8 kg.   Code status:   Code Status: Limited: Do not attempt resuscitation (DNR) -DNR-LIMITED -Do Not Intubate/DNI    DVT Prophylaxis:     Family Communication: Daughter-Beverly-[phone] updated on 7/21   Disposition Plan: Status is: Inpatient Remains inpatient appropriate because: Severity of illness   Planned Discharge Destination:Skilled nursing facility   Diet: Diet Order             Diet heart healthy/carb modified Room service appropriate? No; Fluid consistency: Thin  Diet effective now                     Antimicrobial agents: Anti-infectives (From admission, onward)    Start     Dose/Rate Route Frequency Ordered Stop   12/10/23 2300  cefTRIAXone  (ROCEPHIN ) 1 g in sodium chloride  0.9 % 100 mL IVPB  Status:  Discontinued        1 g 200 mL/hr over 30 Minutes Intravenous Every 24 hours 12/10/23 0353 12/11/23 1013   12/09/23 2300  cefTRIAXone  (ROCEPHIN ) 2 g in sodium chloride  0.9 % 100 mL IVPB        2 g 200 mL/hr over 30 Minutes Intravenous  Once 12/09/23 2249 12/10/23 0117        MEDICATIONS: Scheduled Meds:  Chlorhexidine  Gluconate Cloth  6 each Topical Daily   ezetimibe   10 mg Oral q AM   FLUoxetine   10 mg Oral QPM   insulin  aspart  0-5 Units Subcutaneous QHS   insulin  aspart  0-9 Units Subcutaneous TID WC   loperamide   2 mg Oral Daily   metoprolol  succinate  100 mg Oral BID   pantoprazole   40 mg Oral Daily   pregabalin   50 mg Oral BID   sodium bicarbonate   650 mg Oral BID   traZODone   25 mg Oral QHS   Continuous Infusions:  heparin  1,500 Units/hr (12/14/23 0521)     PRN Meds:.acetaminophen  **OR** acetaminophen , guaiFENesin , hydrALAZINE , ipratropium-albuterol , loperamide , ondansetron  (ZOFRAN ) IV, senna-docusate   I have personally reviewed following labs and imaging studies  LABORATORY DATA: CBC: Recent Labs  Lab 12/09/23 1601 12/09/23 1629 12/10/23 0525 12/12/23 0549 12/13/23 0616 12/14/23 0610  WBC 11.5*  --  10.1 6.9 7.9 7.6  NEUTROABS 7.7  --   --   --   --   --   HGB 12.9* 13.3 12.3* 11.8* 12.4* 12.1*  HCT 40.5 39.0 38.4* 35.5* 37.5* 36.5*  MCV 87.3  --  87.3 84.3 83.9 83.3  PLT 436*  --  388 287 298 292    Basic Metabolic Panel: Recent Labs  Lab 12/10/23 1538 12/11/23 0543 12/12/23 0549 12/13/23 0616 12/14/23 0610  NA 135 139 137 138 139  K 3.6 3.5 3.3* 3.6 3.6  CL 105 112* 100 102 99  CO2 17* 12* 19* 23 26  GLUCOSE 113* 92 96 97 111*  BUN 54* 51* 51* 49* 46*  CREATININE 6.41* 6.06* 6.71* 6.23* 5.41*  CALCIUM  8.6* 7.6* 8.2* 8.6*  8.5*  MG  --  1.7 1.8 1.7 1.7  PHOS  --  4.8*  --  4.3 4.1    GFR: Estimated Creatinine Clearance: 14 mL/min (A) (by C-G formula based on SCr of 5.41 mg/dL (H)).  Liver Function Tests: Recent Labs  Lab 12/09/23 1601 12/12/23 0549 12/13/23 0616 12/14/23 0610  AST 20  --   --   --   ALT 10  --   --   --   ALKPHOS 95  --   --   --   BILITOT 0.6  --   --   --   PROT 5.5*  --   --   --   ALBUMIN  2.8* 2.5* 2.5* 2.6*   No results for input(s): LIPASE, AMYLASE in the last 168 hours. Recent Labs  Lab 12/09/23 1601  AMMONIA 14    Coagulation Profile: No results for input(s): INR, PROTIME in the last 168 hours.  Cardiac Enzymes: No results for input(s): CKTOTAL, CKMB, CKMBINDEX, TROPONINI in the last 168 hours.  BNP (last 3 results) No results for input(s): PROBNP in the last 8760 hours.  Lipid Profile: No results for input(s): CHOL, HDL, LDLCALC, TRIG, CHOLHDL, LDLDIRECT in the last 72 hours.  Thyroid  Function Tests: No results for input(s):  TSH, T4TOTAL, FREET4, T3FREE, THYROIDAB in the last 72 hours.  Anemia Panel: No results for input(s): VITAMINB12, FOLATE, FERRITIN, TIBC, IRON, RETICCTPCT in the last 72 hours.  Urine analysis:    Component Value Date/Time   COLORURINE YELLOW 12/10/2023 0854   APPEARANCEUR CLOUDY (A) 12/10/2023 0854   LABSPEC 1.020 12/10/2023 0854   PHURINE 5.5 12/10/2023 0854   GLUCOSEU NEGATIVE 12/10/2023 0854   HGBUR LARGE (A) 12/10/2023 0854   BILIRUBINUR NEGATIVE 12/10/2023 0854   KETONESUR NEGATIVE 12/10/2023 0854   PROTEINUR 30 (A) 12/10/2023 0854   NITRITE NEGATIVE 12/10/2023 0854   LEUKOCYTESUR SMALL (A) 12/10/2023 0854    Sepsis Labs: Lactic Acid, Venous    Component Value Date/Time   LATICACIDVEN 2.7 (HH) 12/09/2023 1955    MICROBIOLOGY: Recent Results (from the past 240 hours)  Blood culture (routine x 2)     Status: None   Collection Time: 12/09/23  4:20 PM   Specimen: BLOOD LEFT FOREARM  Result Value Ref Range Status   Specimen Description BLOOD LEFT FOREARM  Final   Special Requests   Final    BOTTLES DRAWN AEROBIC AND ANAEROBIC Blood Culture adequate volume   Culture   Final    NO GROWTH 5 DAYS Performed at [facility], [address], [place], [place] [address]    Report Status 12/14/2023 FINAL  Final  Blood culture (routine x 2)     Status: None   Collection Time: 12/09/23  4:44 PM   Specimen: BLOOD RIGHT ARM  Result Value Ref Range Status   Specimen Description BLOOD RIGHT ARM  Final   Special Requests   Final    BOTTLES DRAWN AEROBIC AND ANAEROBIC Blood Culture adequate volume   Culture   Final    NO GROWTH 5 DAYS Performed at [facility], [address] [place], [place] [address]    Report Status 12/14/2023 FINAL  Final  Urine Culture     Status: Abnormal   Collection Time: 12/09/23 10:30 PM   Specimen: Urine, Random  Result Value Ref Range Status   Specimen Description URINE, RANDOM  Final   Special Requests   Final     NONE Reflexed from [id number] Performed at  [facility], [address], [place], [place] [address]    Culture MULTIPLE SPECIES PRESENT, SUGGEST RECOLLECTION (A)  Final   Report Status 12/10/2023 FINAL  Final  C Difficile Quick Screen w PCR reflex     Status: None   Collection Time: 12/10/23  9:50 AM   Specimen: STOOL  Result Value Ref Range Status   C Diff antigen NEGATIVE NEGATIVE Final   C Diff toxin NEGATIVE NEGATIVE Final   C Diff interpretation No C. difficile detected.  Final    Comment: Performed at [facility], [address], [place], [place] [address]  Gastrointestinal Panel by PCR , Stool     Status: None   Collection Time: 12/10/23  1:34 PM   Specimen: Stool  Result Value Ref Range Status   Campylobacter species NOT DETECTED NOT DETECTED Final   Plesimonas shigelloides NOT DETECTED NOT DETECTED Final   Salmonella species NOT DETECTED NOT DETECTED Final   Yersinia enterocolitica NOT DETECTED NOT DETECTED Final   Vibrio species NOT DETECTED NOT DETECTED Final   Vibrio cholerae NOT DETECTED NOT DETECTED Final   Enteroaggregative E coli (EAEC) NOT DETECTED NOT DETECTED Final   Enteropathogenic E coli (EPEC) NOT DETECTED NOT DETECTED Final   Enterotoxigenic E coli (ETEC) NOT DETECTED NOT DETECTED Final   Shiga like toxin producing E coli (STEC) NOT DETECTED NOT DETECTED Final   Shigella/Enteroinvasive E coli (EIEC) NOT DETECTED NOT DETECTED Final   Cryptosporidium NOT DETECTED NOT DETECTED Final   Cyclospora cayetanensis NOT DETECTED NOT DETECTED Final   Entamoeba histolytica NOT DETECTED NOT DETECTED Final   Giardia lamblia NOT DETECTED NOT DETECTED Final   Adenovirus F40/41 NOT DETECTED NOT DETECTED Final   Astrovirus NOT DETECTED NOT DETECTED Final   Norovirus GI/GII NOT DETECTED NOT DETECTED Final   Rotavirus A NOT DETECTED NOT DETECTED Final   Sapovirus (I, II, IV, and V) NOT DETECTED NOT DETECTED Final    Comment: Performed at [facility],  [address] [place], [place] [address]  MRSA Next Gen by PCR, Nasal     Status: None   Collection Time: 12/10/23  5:13 PM   Specimen: Nasal Mucosa; Nasal Swab  Result Value Ref Range Status   MRSA by PCR Next Gen NOT DETECTED NOT DETECTED Final    Comment: (NOTE) The GeneXpert MRSA Assay (FDA approved for NASAL specimens only), is one component of a comprehensive MRSA colonization surveillance program. It is not intended to diagnose MRSA infection nor to guide or monitor treatment for MRSA infections. Test performance is not FDA approved in patients less than 69 years old. Performed at [facility], [address], [place], [place] [address]     RADIOLOGY STUDIES/RESULTS: No results found.    LOS: 4 days   Donalda Applebaum, MD  [facility]    To contact the attending provider between 7A-7P or the covering provider during after hours 7P-7A, please log into the web site [email or web address] and access using universal [employer] password for that web site. If you do not have the password, please call the hospital operator.  12/14/2023, 9:44 AM

## 2023-12-14 NOTE — TOC Progression Note (Signed)
 Transition of Care [facility]) - Progression Note    Patient Details  Name: Steve Jensen MRN: [id number] Date of Birth: September 14, 1940  [facility] CM/SW Contact  Inocente GORMAN Kindle, LCSW Phone Number: 12/14/2023, 9:46 AM  Clinical Narrative:    CSW continuing to follow.    Expected Discharge Plan: Skilled Nursing Facility Barriers to Discharge: Continued Medical Work up  Expected Discharge Plan and Services In-house Referral: Clinical Social Work     Living arrangements for the past 2 months: Skilled Nursing Facility                                       Social Determinants of Health (SDOH) Interventions SDOH Screenings   Food Insecurity: No Food Insecurity (12/10/2023)  Housing: Low Risk  (12/10/2023)  Transportation Needs: No Transportation Needs (12/10/2023)  Utilities: Not At Risk (12/10/2023)  Social Connections: Socially Isolated (12/10/2023)  Tobacco Use: Medium Risk (12/09/2023)    Readmission Risk Interventions    12/26/2021   12:51 PM 12/25/2021    3:31 PM 12/19/2021   12:39 PM  Readmission Risk Prevention Plan  Transportation Screening  Complete Complete  PCP or Specialist Appt within 5-7 Days Complete    Home Care Screening  Complete   Medication Review (RN CM)  Complete   HRI or Home Care Consult   Complete  Social Work Consult for Recovery Care Planning/Counseling   Complete  Palliative Care Screening   Not Applicable  Medication Review Oceanographer)   Complete

## 2023-12-14 NOTE — Progress Notes (Signed)
 Patient ID: DARL KUSS, male   DOB: 11-08-1940, 83 y.o.   MRN: [id number] [facility] Progress Note   Assessment/ Plan:   1. Acute kidney Injury on chronic kidney disease stage IIIa: Baseline creatinine ranging 1.2-1.3 and acute kidney injury is suspected to have been from ischemic ATN with sustained prerenal state.  Currently he is nonoliguric (augmented by diuretics) and with improving renal function seen on labs.  No indication for hemodialysis noted at this time and will redose furosemide  with volume status noted on exam. 2.  Acute exacerbation of congestive heart failure: Continue intermittent diuretic dosing for augmenting urine output/volume unloading status post acute kidney injury. 3.  Metabolic acidosis: Secondary to diarrhea and acute kidney injury, rising bicarbonate level, discontinue oral sodium bicarbonate  at this time. 4.  Acute metabolic encephalopathy: Appears to have resolved and awaiting confirmation of this from family. 5.  Chronic diarrhea: Testing negative for C. difficile infection following recent treatment/history treated with oral vancomycin .  Subjective:   Denies any acute clinical events overnight.  No complaints when seen.   Objective:   BP 113/60 (BP Location: Right Wrist)   Pulse 76   Temp (!) 97.4 F (36.3 C) (Axillary)   Resp 17   Wt 124.8 kg   SpO2 90%   BMI 40.63 kg/m   Intake/Output Summary (Last 24 hours) at 12/14/2023 1002 Last data filed at 12/14/2023 9487 Gross per 24 hour  Intake 566.54 ml  Output 3250 ml  Net -2683.46 ml   Weight change:   Physical Exam: Gen: Propped up in bed eating breakfast CVS: Pulse regular rhythm, normal rate, S1 and S2 normal Resp: Poor respiratory effort with coarse breath sounds bilaterally Abd: Soft, obese, nontender, bowel sounds normal Ext: 2+ pitting edema over upper and lower extremities  Imaging: No results found.  Labs: BMET Recent Labs  Lab 12/09/23 1601 12/09/23 1629  12/10/23 0525 12/10/23 1538 12/11/23 0543 12/12/23 0549 12/13/23 0616 12/14/23 0610  NA 137 137 135 135 139 137 138 139  K 4.2 4.2 3.8 3.6 3.5 3.3* 3.6 3.6  CL 104  --  106 105 112* 100 102 99  CO2 18*  --  16* 17* 12* 19* 23 26  GLUCOSE 83  --  93 113* 92 96 97 111*  BUN 51*  --  52* 54* 51* 51* 49* 46*  CREATININE 6.07*  --  6.11* 6.41* 6.06* 6.71* 6.23* 5.41*  CALCIUM  9.1  --  8.6* 8.6* 7.6* 8.2* 8.6* 8.5*  PHOS  --   --   --   --  4.8*  --  4.3 4.1   CBC Recent Labs  Lab 12/09/23 1601 12/09/23 1629 12/10/23 0525 12/12/23 0549 12/13/23 0616 12/14/23 0610  WBC 11.5*  --  10.1 6.9 7.9 7.6  NEUTROABS 7.7  --   --   --   --   --   HGB 12.9*   < > 12.3* 11.8* 12.4* 12.1*  HCT 40.5   < > 38.4* 35.5* 37.5* 36.5*  MCV 87.3  --  87.3 84.3 83.9 83.3  PLT 436*  --  388 287 298 292   < > = values in this interval not displayed.    Medications:     Chlorhexidine  Gluconate Cloth  6 each Topical Daily   ezetimibe   10 mg Oral q AM   FLUoxetine   10 mg Oral QPM   insulin  aspart  0-5 Units Subcutaneous QHS   insulin  aspart  0-9 Units Subcutaneous TID WC  loperamide   2 mg Oral Daily   metoprolol  succinate  100 mg Oral BID   pantoprazole   40 mg Oral Daily   pregabalin   50 mg Oral BID   sodium bicarbonate   650 mg Oral BID   traZODone   25 mg Oral QHS    Gordy Blanch, MD 12/14/2023, 10:02 AM

## 2023-12-15 DIAGNOSIS — N179 Acute kidney failure, unspecified: Secondary | ICD-10-CM | POA: Diagnosis not present

## 2023-12-15 DIAGNOSIS — E8721 Acute metabolic acidosis: Secondary | ICD-10-CM | POA: Diagnosis not present

## 2023-12-15 DIAGNOSIS — G9341 Metabolic encephalopathy: Secondary | ICD-10-CM | POA: Diagnosis not present

## 2023-12-15 DIAGNOSIS — A0472 Enterocolitis due to Clostridium difficile, not specified as recurrent: Secondary | ICD-10-CM | POA: Diagnosis not present

## 2023-12-15 LAB — CBC
HCT: 39.1 % (ref 39.0–52.0)
Hemoglobin: 13.3 g/dL (ref 13.0–17.0)
MCH: 28.2 pg (ref 26.0–34.0)
MCHC: 34 g/dL (ref 30.0–36.0)
MCV: 82.8 fL (ref 80.0–100.0)
Platelets: 328 K/uL (ref 150–400)
RBC: 4.72 MIL/uL (ref 4.22–5.81)
RDW: 18.4 % — ABNORMAL HIGH (ref 11.5–15.5)
WBC: 9.2 K/uL (ref 4.0–10.5)
nRBC: 0 % (ref 0.0–0.2)

## 2023-12-15 LAB — GLUCOSE, CAPILLARY
Glucose-Capillary: 117 mg/dL — ABNORMAL HIGH (ref 70–99)
Glucose-Capillary: 151 mg/dL — ABNORMAL HIGH (ref 70–99)
Glucose-Capillary: 133 mg/dL — ABNORMAL HIGH (ref 70–99)
Glucose-Capillary: 152 mg/dL — ABNORMAL HIGH (ref 70–99)

## 2023-12-15 LAB — RENAL FUNCTION PANEL
Albumin: 2.8 g/dL — ABNORMAL LOW (ref 3.5–5.0)
Anion gap: 15 (ref 5–15)
BUN: 44 mg/dL — ABNORMAL HIGH (ref 8–23)
CO2: 26 mmol/L (ref 22–32)
Calcium: 9 mg/dL (ref 8.9–10.3)
Chloride: 96 mmol/L — ABNORMAL LOW (ref 98–111)
Creatinine, Ser: 4.81 mg/dL — ABNORMAL HIGH (ref 0.61–1.24)
GFR, Estimated: 11 mL/min — ABNORMAL LOW (ref >60)
Glucose, Bld: 110 mg/dL — ABNORMAL HIGH (ref 70–99)
Phosphorus: 3.9 mg/dL (ref 2.5–4.6)
Potassium: 3 mmol/L — ABNORMAL LOW (ref 3.5–5.1)
Sodium: 137 mmol/L (ref 135–145)

## 2023-12-15 LAB — APTT: aPTT: 71 s — ABNORMAL HIGH (ref 24–36)

## 2023-12-15 LAB — HEPARIN LEVEL (UNFRACTIONATED): Heparin Unfractionated: 0.93 [IU]/mL — ABNORMAL HIGH (ref 0.30–0.70)

## 2023-12-15 LAB — MAGNESIUM: Magnesium: 1.5 mg/dL — ABNORMAL LOW (ref 1.7–2.4)

## 2023-12-15 MED ORDER — FUROSEMIDE 10 MG/ML IJ SOLN
80.0000 mg | Freq: Once | INTRAMUSCULAR | Status: AC
  Administered 2023-12-15: 80 mg via INTRAVENOUS
  Filled 2023-12-15: qty 8

## 2023-12-15 MED ORDER — POTASSIUM CHLORIDE 20 MEQ PO PACK
40.0000 meq | PACK | Freq: Four times a day (QID) | ORAL | Status: AC
  Administered 2023-12-15 (×2): 40 meq via ORAL
  Filled 2023-12-15 (×2): qty 2

## 2023-12-15 MED ORDER — MAGNESIUM SULFATE 4 GM/100ML IV SOLN
4.0000 g | Freq: Once | INTRAVENOUS | Status: AC
  Administered 2023-12-15: 4 g via INTRAVENOUS
  Filled 2023-12-15: qty 100

## 2023-12-15 MED ORDER — APIXABAN 2.5 MG PO TABS
2.5000 mg | ORAL_TABLET | Freq: Two times a day (BID) | ORAL | Status: DC
  Administered 2023-12-15 – 2023-12-18 (×7): 2.5 mg via ORAL
  Filled 2023-12-15 (×7): qty 1

## 2023-12-15 NOTE — Plan of Care (Signed)
  Problem: Education: Goal: Ability to describe self-care measures that may prevent or decrease complications (Diabetes Survival Skills Education) will improve Outcome: Progressing   Problem: Coping: Goal: Ability to adjust to condition or change in health will improve Outcome: Progressing   Problem: Health Behavior/Discharge Planning: Goal: Ability to identify and utilize available resources and services will improve Outcome: Progressing Goal: Ability to manage health-related needs will improve Outcome: Progressing

## 2023-12-15 NOTE — Progress Notes (Signed)
 PT Cancellation Note  Patient Details Name: BOBBI KOZAKIEWICZ MRN: [id number] DOB: May 02, 1941   Cancelled Treatment:    Reason Eval/Treat Not Completed: PT screened, no needs identified, will sign off. Pt seen and evaluated by PT and OT on 7/18 in which no follow-up acute PT services are indicated at this time as pt is at his functional baseline. Pt is from LTC facility where he is dependent for all functional mobility and requires use of a Hoyer lift for transfers. PT spoke with pt's RN today and she indicated no changes in pt's status to indicate another PT evaluation at this time. PT signing off.    Delon HERO Jene Huq 12/15/2023, 8:40 AM

## 2023-12-15 NOTE — Progress Notes (Addendum)
 PROGRESS NOTE        PATIENT DETAILS Name: Steve Jensen Age: 83 y.o. Sex: male Date of Birth: 04/26/1941 Admit Date: 12/09/2023 Admitting Physician Editha Ram, MD ERE:[email or web address], Provider Not In  Brief Summary: Patient is a 83 y.o.  male with history of A-fib, HTN, chronic HFrEF, PAF, CKD stage IIIa, hepatocellular CA, prostate CA, OSA on CPAP-presented to the hospital with confusion-found to have AKI.  Significant events: 7/17>> admit to [facility].  Significant studies: 7/16>>CT Head:no acute intracranial pathology 7/16>>CT Renal Stone study: No hydronephrosis. 7/16>> CXR: No PNA  Significant microbiology data: 7/16>> urine culture: Multiple species. 7/16>> blood culture: No growth 7/17>> GI pathogen panel: Negative 7/17>> stool C. difficile PCR: Negative  Procedures: None  Consults: Renal  Subjective: No major issues overnight-some wheezing but lying comfortably in bed.  No diarrhea per nursing staff.  Objective: Vitals: Blood pressure (!) 101/57, pulse 69, temperature 97.7 F (36.5 C), temperature source Axillary, resp. rate 16, weight 124.8 kg, SpO2 91%.   Exam: Awake/alert Chest: Some wheezing but clear to auscultation CVS: S1-S2 regular Extremities + edema Neurology:Generalized weakness but nonfocal.   Pertinent Labs/Radiology:    Latest Ref Rng & Units 12/15/2023    5:43 AM 12/14/2023    6:10 AM 12/13/2023    6:16 AM  CBC  WBC 4.0 - 10.5 K/uL 9.2  7.6  7.9   Hemoglobin 13.0 - 17.0 g/dL 86.6  87.8  87.5   Hematocrit 39.0 - 52.0 % 39.1  36.5  37.5   Platelets 150 - 400 K/uL 328  292  298     Lab Results  Component Value Date   NA 137 12/15/2023   K 3.0 (L) 12/15/2023   CL 96 (L) 12/15/2023   CO2 26 12/15/2023      Assessment/Plan: AKI on CKD stage IIIa AKI likely hemodynamically mediated in the setting of diarrhea/diuretic/metformin  use. Thankfully with supportive care-renal function plateaued and now has started to  improve. Remains volume overloaded-nephrology service following and directing IV diuretic regimen. Continue to avoid nephrotoxic agents Await further recommendations from nephrology.      Non-anion gap metabolic acidosis Due to combination of diarrhea and AKI Improved-briefly required bicarb infusion Now on oral bicarb.  Hypokalemia Likely secondary to furosemide  Replete/recheck.  Hypomagnesemia Replete/recheck.  Acute metabolic encephalopathy Secondary to AKI Improved-answering questions appropriately.  Not clear if he is back to his baseline-no family at bedside.  Acute on chronic HFpEF More volume overloaded-suspect this is primarily due to IV fluid resuscitation Improving with diuresis On IV diuretics per nephrology Continue to follow electrolytes  Chronic atrial fibrillation Rate controlled Telemetry monitoring Continue metoprolol  Placed on IV heparin  as TDC insertion-was being contemplated for HD-now that renal function is improved-not felt to require HD-will stop heparin  and place back on Eliquis .  ?  UTI Has no symptoms-2 samples of UA with discordant results Since not symptomatic-has recent diagnosis of C. difficile-antibiotics stopped on 7/18-and being monitored off antibiotics.    Chronic diarrhea Ongoing for the past 3-4 months Repeat stool studies negative for C. difficile/infection Has essentially resolved. Continue with as needed Imodium . Due to improvement-suspect we can hold off on GI evaluation at this point.   Recent history of C. difficile diarrhea Completed course of oral vancomycin -repeat stool studies negative. Minimize use of antibiotics as much as possible  HLD Zetia   DM-2 (A1c 7.5  on 4/11) CBG stable SSI for now.   Recent Labs    12/14/23 1559 12/14/23 2117 12/15/23 0758  GLUCAP 162* 120* 117*    Peripheral neuropathy Lyrica .  History of hepatocellular carcinoma History of prostate cancer-s/p prostatectomy Outpatient  oncology/urology follow-up.  Recent history of L4 vertebral body compression fracture Repeat CT this admit-with lytic component-with concern for metastatic disease. Continue TSLO brace-outpatient neurosurgical follow-up  Mood disorder Relatively stable Continue Prozac /trazodone .  OSA CPAP nightly  Class 3 Obesity: Estimated body mass index is 40.63 kg/m as calculated from the following:   Height as of 11/10/23: 5' 9 (1.753 m).   Weight as of this encounter: 124.8 kg.   Code status:   Code Status: Limited: Do not attempt resuscitation (DNR) -DNR-LIMITED -Do Not Intubate/DNI    DVT Prophylaxis: apixaban  (ELIQUIS ) tablet 2.5 mg Start: 12/15/23 1000 apixaban  (ELIQUIS ) tablet 2.5 mg    Family Communication: Daughter-Beverly-[phone] updated on 7/21   Disposition Plan: Status is: Inpatient Remains inpatient appropriate because: Severity of illness   Planned Discharge Destination:Skilled nursing facility   Diet: Diet Order             Diet heart healthy/carb modified Room service appropriate? No; Fluid consistency: Thin  Diet effective now                     Antimicrobial agents: Anti-infectives (From admission, onward)    Start     Dose/Rate Route Frequency Ordered Stop   12/10/23 2300  cefTRIAXone  (ROCEPHIN ) 1 g in sodium chloride  0.9 % 100 mL IVPB  Status:  Discontinued        1 g 200 mL/hr over 30 Minutes Intravenous Every 24 hours 12/10/23 0353 12/11/23 1013   12/09/23 2300  cefTRIAXone  (ROCEPHIN ) 2 g in sodium chloride  0.9 % 100 mL IVPB        2 g 200 mL/hr over 30 Minutes Intravenous  Once 12/09/23 2249 12/10/23 0117        MEDICATIONS: Scheduled Meds:  apixaban   2.5 mg Oral BID   Chlorhexidine  Gluconate Cloth  6 each Topical Daily   ezetimibe   10 mg Oral q AM   FLUoxetine   10 mg Oral QPM   furosemide   80 mg Intravenous Once   insulin  aspart  0-5 Units Subcutaneous QHS   insulin  aspart  0-9 Units Subcutaneous TID WC   loperamide   2 mg  Oral Daily   metoprolol  succinate  100 mg Oral BID   pantoprazole   40 mg Oral Daily   potassium chloride   40 mEq Oral Q6H   pregabalin   50 mg Oral BID   traZODone   25 mg Oral QHS   Continuous Infusions:    PRN Meds:.acetaminophen  **OR** acetaminophen , guaiFENesin , hydrALAZINE , ipratropium-albuterol , loperamide , ondansetron  (ZOFRAN ) IV, senna-docusate   I have personally reviewed following labs and imaging studies  LABORATORY DATA: CBC: Recent Labs  Lab 12/09/23 1601 12/09/23 1629 12/10/23 0525 12/12/23 0549 12/13/23 0616 12/14/23 0610 12/15/23 0543  WBC 11.5*  --  10.1 6.9 7.9 7.6 9.2  NEUTROABS 7.7  --   --   --   --   --   --   HGB 12.9*   < > 12.3* 11.8* 12.4* 12.1* 13.3  HCT 40.5   < > 38.4* 35.5* 37.5* 36.5* 39.1  MCV 87.3  --  87.3 84.3 83.9 83.3 82.8  PLT 436*  --  388 287 298 292 328   < > = values in this interval not displayed.    Basic  Metabolic Panel: Recent Labs  Lab 12/11/23 0543 12/12/23 0549 12/13/23 0616 12/14/23 0610 12/15/23 0543  NA 139 137 138 139 137  K 3.5 3.3* 3.6 3.6 3.0*  CL 112* 100 102 99 96*  CO2 12* 19* 23 26 26   GLUCOSE 92 96 97 111* 110*  BUN 51* 51* 49* 46* 44*  CREATININE 6.06* 6.71* 6.23* 5.41* 4.81*  CALCIUM  7.6* 8.2* 8.6* 8.5* 9.0  MG 1.7 1.8 1.7 1.7 1.5*  PHOS 4.8*  --  4.3 4.1 3.9    GFR: Estimated Creatinine Clearance: 15.7 mL/min (A) (by C-G formula based on SCr of 4.81 mg/dL (H)).  Liver Function Tests: Recent Labs  Lab 12/09/23 1601 12/12/23 0549 12/13/23 0616 12/14/23 0610 12/15/23 0543  AST 20  --   --   --   --   ALT 10  --   --   --   --   ALKPHOS 95  --   --   --   --   BILITOT 0.6  --   --   --   --   PROT 5.5*  --   --   --   --   ALBUMIN  2.8* 2.5* 2.5* 2.6* 2.8*   No results for input(s): LIPASE, AMYLASE in the last 168 hours. Recent Labs  Lab 12/09/23 1601  AMMONIA 14    Coagulation Profile: No results for input(s): INR, PROTIME in the last 168 hours.  Cardiac Enzymes: No  results for input(s): CKTOTAL, CKMB, CKMBINDEX, TROPONINI in the last 168 hours.  BNP (last 3 results) No results for input(s): PROBNP in the last 8760 hours.  Lipid Profile: No results for input(s): CHOL, HDL, LDLCALC, TRIG, CHOLHDL, LDLDIRECT in the last 72 hours.  Thyroid  Function Tests: No results for input(s): TSH, T4TOTAL, FREET4, T3FREE, THYROIDAB in the last 72 hours.  Anemia Panel: No results for input(s): VITAMINB12, FOLATE, FERRITIN, TIBC, IRON, RETICCTPCT in the last 72 hours.  Urine analysis:    Component Value Date/Time   COLORURINE YELLOW 12/10/2023 0854   APPEARANCEUR CLOUDY (A) 12/10/2023 0854   LABSPEC 1.020 12/10/2023 0854   PHURINE 5.5 12/10/2023 0854   GLUCOSEU NEGATIVE 12/10/2023 0854   HGBUR LARGE (A) 12/10/2023 0854   BILIRUBINUR NEGATIVE 12/10/2023 0854   KETONESUR NEGATIVE 12/10/2023 0854   PROTEINUR 30 (A) 12/10/2023 0854   NITRITE NEGATIVE 12/10/2023 0854   LEUKOCYTESUR SMALL (A) 12/10/2023 0854    Sepsis Labs: Lactic Acid, Venous    Component Value Date/Time   LATICACIDVEN 2.7 (HH) 12/09/2023 1955    MICROBIOLOGY: Recent Results (from the past 240 hours)  Blood culture (routine x 2)     Status: None   Collection Time: 12/09/23  4:20 PM   Specimen: BLOOD LEFT FOREARM  Result Value Ref Range Status   Specimen Description BLOOD LEFT FOREARM  Final   Special Requests   Final    BOTTLES DRAWN AEROBIC AND ANAEROBIC Blood Culture adequate volume   Culture   Final    NO GROWTH 5 DAYS Performed at [facility], [address], [place], [place] [address]    Report Status 12/14/2023 FINAL  Final  Blood culture (routine x 2)     Status: None   Collection Time: 12/09/23  4:44 PM   Specimen: BLOOD RIGHT ARM  Result Value Ref Range Status   Specimen Description BLOOD RIGHT ARM  Final   Special Requests   Final    BOTTLES DRAWN AEROBIC AND ANAEROBIC Blood Culture adequate volume  Culture   Final     NO GROWTH 5 DAYS Performed at [facility], [address] [place], [place] [address]    Report Status 12/14/2023 FINAL  Final  Urine Culture     Status: Abnormal   Collection Time: 12/09/23 10:30 PM   Specimen: Urine, Random  Result Value Ref Range Status   Specimen Description URINE, RANDOM  Final   Special Requests   Final    NONE Reflexed from [id number] Performed at [facility], [address] [place], [place] [address]    Culture MULTIPLE SPECIES PRESENT, SUGGEST RECOLLECTION (A)  Final   Report Status 12/10/2023 FINAL  Final  C Difficile Quick Screen w PCR reflex     Status: None   Collection Time: 12/10/23  9:50 AM   Specimen: STOOL  Result Value Ref Range Status   C Diff antigen NEGATIVE NEGATIVE Final   C Diff toxin NEGATIVE NEGATIVE Final   C Diff interpretation No C. difficile detected.  Final    Comment: Performed at [facility], [address] [place], [place] [address]  Gastrointestinal Panel by PCR , Stool     Status: None   Collection Time: 12/10/23  1:34 PM   Specimen: Stool  Result Value Ref Range Status   Campylobacter species NOT DETECTED NOT DETECTED Final   Plesimonas shigelloides NOT DETECTED NOT DETECTED Final   Salmonella species NOT DETECTED NOT DETECTED Final   Yersinia enterocolitica NOT DETECTED NOT DETECTED Final   Vibrio species NOT DETECTED NOT DETECTED Final   Vibrio cholerae NOT DETECTED NOT DETECTED Final   Enteroaggregative E coli (EAEC) NOT DETECTED NOT DETECTED Final   Enteropathogenic E coli (EPEC) NOT DETECTED NOT DETECTED Final   Enterotoxigenic E coli (ETEC) NOT DETECTED NOT DETECTED Final   Shiga like toxin producing E coli (STEC) NOT DETECTED NOT DETECTED Final   Shigella/Enteroinvasive E coli (EIEC) NOT DETECTED NOT DETECTED Final   Cryptosporidium NOT DETECTED NOT DETECTED Final   Cyclospora cayetanensis NOT DETECTED NOT DETECTED Final   Entamoeba histolytica NOT DETECTED NOT DETECTED Final   Giardia  lamblia NOT DETECTED NOT DETECTED Final   Adenovirus F40/41 NOT DETECTED NOT DETECTED Final   Astrovirus NOT DETECTED NOT DETECTED Final   Norovirus GI/GII NOT DETECTED NOT DETECTED Final   Rotavirus A NOT DETECTED NOT DETECTED Final   Sapovirus (I, II, IV, and V) NOT DETECTED NOT DETECTED Final    Comment: Performed at [facility], [address] [place], [place] [address]  MRSA Next Gen by PCR, Nasal     Status: None   Collection Time: 12/10/23  5:13 PM   Specimen: Nasal Mucosa; Nasal Swab  Result Value Ref Range Status   MRSA by PCR Next Gen NOT DETECTED NOT DETECTED Final    Comment: (NOTE) The GeneXpert MRSA Assay (FDA approved for NASAL specimens only), is one component of a comprehensive MRSA colonization surveillance program. It is not intended to diagnose MRSA infection nor to guide or monitor treatment for MRSA infections. Test performance is not FDA approved in patients less than 58 years old. Performed at [facility], [address], [place], [place] [address]     RADIOLOGY STUDIES/RESULTS: No results found.    LOS: 5 days   Donalda Applebaum, MD  [facility]    To contact the attending provider between 7A-7P or the covering provider during after hours 7P-7A, please log into the web site [email or web address] and access using  universal [place] password for that web site. If you do not have the password, please call the hospital operator.  12/15/2023, 10:46 AM

## 2023-12-15 NOTE — Progress Notes (Signed)
 Patient ID: Steve Jensen, male   DOB: 07/04/1940, 83 y.o.   MRN: [id number]  [facility] Progress Note   Assessment/ Plan:   1. Acute kidney Injury on chronic kidney disease stage IIIa: Baseline creatinine ranging 1.2-1.3 and acute kidney injury is suspected to have been from ischemic ATN with sustained prerenal state.  He is nonoliguric on furosemide  and continues to have a downtrending creatinine without any acute indications for dialysis at this time.  Mild hypokalemia noted this morning, will replace via oral route. 2.  Acute exacerbation of congestive heart failure: Appears to be currently compensated.  Continue intermittent diuretic dosing for augmenting urine output/volume unloading status post acute kidney injury. 3.  Metabolic acidosis: Secondary to diarrhea and acute kidney injury, improved status post sodium bicarbonate  supplementation that was discontinued yesterday. 4.  Acute metabolic encephalopathy: Appears to have resolved and currently thought to be back to baseline. 5.  Chronic diarrhea: Testing negative for C. difficile infection following recent treatment/history treated with oral vancomycin .  Subjective:   Denies any acute clinical events overnight.  No complaints when seen.   Objective:   BP (!) 101/57 (BP Location: Right Arm)   Pulse 69   Temp 97.7 F (36.5 C) (Axillary)   Resp 16   Wt 124.8 kg   SpO2 91%   BMI 40.63 kg/m   Intake/Output Summary (Last 24 hours) at 12/15/2023 9062 Last data filed at 12/14/2023 2200 Gross per 24 hour  Intake 1065 ml  Output 1800 ml  Net -735 ml   Weight change:   Physical Exam: Gen: Somnolent, awakens to touch and responds appropriately CVS: Pulse regular rhythm, normal rate, S1 and S2 normal Resp: Poor respiratory effort with coarse breath sounds bilaterally Abd: Soft, obese, nontender, bowel sounds normal Ext: 2+ pitting edema over upper and lower extremities  Imaging: No results  found.  Labs: BMET Recent Labs  Lab 12/10/23 0525 12/10/23 1538 12/11/23 0543 12/12/23 0549 12/13/23 0616 12/14/23 0610 12/15/23 0543  NA 135 135 139 137 138 139 137  K 3.8 3.6 3.5 3.3* 3.6 3.6 3.0*  CL 106 105 112* 100 102 99 96*  CO2 16* 17* 12* 19* 23 26 26   GLUCOSE 93 113* 92 96 97 111* 110*  BUN 52* 54* 51* 51* 49* 46* 44*  CREATININE 6.11* 6.41* 6.06* 6.71* 6.23* 5.41* 4.81*  CALCIUM  8.6* 8.6* 7.6* 8.2* 8.6* 8.5* 9.0  PHOS  --   --  4.8*  --  4.3 4.1 3.9   CBC Recent Labs  Lab 12/09/23 1601 12/09/23 1629 12/12/23 0549 12/13/23 0616 12/14/23 0610 12/15/23 0543  WBC 11.5*   < > 6.9 7.9 7.6 9.2  NEUTROABS 7.7  --   --   --   --   --   HGB 12.9*   < > 11.8* 12.4* 12.1* 13.3  HCT 40.5   < > 35.5* 37.5* 36.5* 39.1  MCV 87.3   < > 84.3 83.9 83.3 82.8  PLT 436*   < > 287 298 292 328   < > = values in this interval not displayed.    Medications:     apixaban   2.5 mg Oral BID   Chlorhexidine  Gluconate Cloth  6 each Topical Daily   ezetimibe   10 mg Oral q AM   FLUoxetine   10 mg Oral QPM   insulin  aspart  0-5 Units Subcutaneous QHS   insulin  aspart  0-9 Units Subcutaneous TID WC   loperamide   2 mg Oral Daily  metoprolol  succinate  100 mg Oral BID   pantoprazole   40 mg Oral Daily   potassium chloride   40 mEq Oral Q6H   pregabalin   50 mg Oral BID   traZODone   25 mg Oral QHS    Gordy Blanch, MD 12/15/2023, 9:37 AM

## 2023-12-16 DIAGNOSIS — G9341 Metabolic encephalopathy: Secondary | ICD-10-CM | POA: Diagnosis not present

## 2023-12-16 DIAGNOSIS — E8721 Acute metabolic acidosis: Secondary | ICD-10-CM | POA: Diagnosis not present

## 2023-12-16 DIAGNOSIS — A0472 Enterocolitis due to Clostridium difficile, not specified as recurrent: Secondary | ICD-10-CM | POA: Diagnosis not present

## 2023-12-16 DIAGNOSIS — N179 Acute kidney failure, unspecified: Secondary | ICD-10-CM | POA: Diagnosis not present

## 2023-12-16 LAB — GLUCOSE, CAPILLARY
Glucose-Capillary: 122 mg/dL — ABNORMAL HIGH (ref 70–99)
Glucose-Capillary: 141 mg/dL — ABNORMAL HIGH (ref 70–99)
Glucose-Capillary: 150 mg/dL — ABNORMAL HIGH (ref 70–99)

## 2023-12-16 LAB — RENAL FUNCTION PANEL
Albumin: 2.9 g/dL — ABNORMAL LOW (ref 3.5–5.0)
Anion gap: 15 (ref 5–15)
BUN: 42 mg/dL — ABNORMAL HIGH (ref 8–23)
CO2: 26 mmol/L (ref 22–32)
Calcium: 9.1 mg/dL (ref 8.9–10.3)
Chloride: 97 mmol/L — ABNORMAL LOW (ref 98–111)
Creatinine, Ser: 4.16 mg/dL — ABNORMAL HIGH (ref 0.61–1.24)
GFR, Estimated: 14 mL/min — ABNORMAL LOW (ref >60)
Glucose, Bld: 127 mg/dL — ABNORMAL HIGH (ref 70–99)
Phosphorus: 3.6 mg/dL (ref 2.5–4.6)
Potassium: 3.6 mmol/L (ref 3.5–5.1)
Sodium: 138 mmol/L (ref 135–145)

## 2023-12-16 LAB — CBC
HCT: 40 % (ref 39.0–52.0)
Hemoglobin: 13.3 g/dL (ref 13.0–17.0)
MCH: 27.7 pg (ref 26.0–34.0)
MCHC: 33.3 g/dL (ref 30.0–36.0)
MCV: 83.2 fL (ref 80.0–100.0)
Platelets: 315 K/uL (ref 150–400)
RBC: 4.81 MIL/uL (ref 4.22–5.81)
RDW: 18.1 % — ABNORMAL HIGH (ref 11.5–15.5)
WBC: 10.1 K/uL (ref 4.0–10.5)
nRBC: 0 % (ref 0.0–0.2)

## 2023-12-16 LAB — MAGNESIUM: Magnesium: 1.5 mg/dL — ABNORMAL LOW (ref 1.7–2.4)

## 2023-12-16 MED ORDER — MAGNESIUM SULFATE 4 GM/100ML IV SOLN
4.0000 g | Freq: Once | INTRAVENOUS | Status: AC
  Administered 2023-12-16: 4 g via INTRAVENOUS
  Filled 2023-12-16: qty 100

## 2023-12-16 MED ORDER — TAMSULOSIN HCL 0.4 MG PO CAPS
0.4000 mg | ORAL_CAPSULE | Freq: Every day | ORAL | Status: DC
  Administered 2023-12-16 – 2023-12-18 (×3): 0.4 mg via ORAL
  Filled 2023-12-16 (×3): qty 1

## 2023-12-16 NOTE — Plan of Care (Signed)

## 2023-12-16 NOTE — TOC Progression Note (Signed)
 Transition of Care [facility]) - Progression Note    Patient Details  Name: KINNIE KAUPP MRN: [id number] Date of Birth: 1940-08-12  [facility] CM/SW Contact  Inocente GORMAN Kindle, LCSW Phone Number: 12/16/2023, 12:27 PM  Clinical Narrative:    CSW provided update to [facility].    Expected Discharge Plan: Skilled Nursing Facility Barriers to Discharge: Continued Medical Work up               Expected Discharge Plan and Services In-house Referral: Clinical Social Work     Living arrangements for the past 2 months: Skilled Nursing Facility                                       Social Drivers of Health (SDOH) Interventions SDOH Screenings   Food Insecurity: No Food Insecurity (12/10/2023)  Housing: Low Risk  (12/10/2023)  Transportation Needs: No Transportation Needs (12/10/2023)  Utilities: Not At Risk (12/10/2023)  Social Connections: Socially Isolated (12/10/2023)  Tobacco Use: Medium Risk (12/09/2023)    Readmission Risk Interventions    12/26/2021   12:51 PM 12/25/2021    3:31 PM 12/19/2021   12:39 PM  Readmission Risk Prevention Plan  Transportation Screening  Complete Complete  PCP or Specialist Appt within 5-7 Days Complete    Home Care Screening  Complete   Medication Review (RN CM)  Complete   HRI or Home Care Consult   Complete  Social Work Consult for Recovery Care Planning/Counseling   Complete  Palliative Care Screening   Not Applicable  Medication Review Oceanographer)   Complete

## 2023-12-16 NOTE — Progress Notes (Addendum)
 PROGRESS NOTE        PATIENT DETAILS Name: Steve Jensen Age: 83 y.o. Sex: male Date of Birth: May 25, 1941 Admit Date: 12/09/2023 Admitting Physician Editha Ram, MD ERE:[email or web address], Provider Not In  Brief Summary: Patient is a 83 y.o.  male with history of A-fib, HTN, chronic HFrEF, PAF, CKD stage IIIa, hepatocellular CA, prostate CA, OSA on CPAP-presented to the hospital with confusion-found to have AKI.  Significant events: 7/17>> admit to [facility].  Significant studies: 7/16>>CT Head:no acute intracranial pathology 7/16>>CT Renal Stone study: No hydronephrosis. 7/16>> CXR: No PNA  Significant microbiology data: 7/16>> urine culture: Multiple species. 7/16>> blood culture: No growth 7/17>> GI pathogen panel: Negative 7/17>> stool C. difficile PCR: Negative  Procedures: None  Consults: Renal  Subjective: Unchanged-no major issues overnight.  Objective: Vitals: Blood pressure 115/66, pulse 79, temperature 97.6 F (36.4 C), temperature source Oral, resp. rate 17, weight 124.8 kg, SpO2 90%.   Exam: Awake/alert Chest: Clear to auscultation CVS: S1-S2 regular Extremities: Trace edema Neurology: Generalized weakness but nonfocal.  Pertinent Labs/Radiology:    Latest Ref Rng & Units 12/16/2023    4:37 AM 12/15/2023    5:43 AM 12/14/2023    6:10 AM  CBC  WBC 4.0 - 10.5 K/uL 10.1  9.2  7.6   Hemoglobin 13.0 - 17.0 g/dL 86.6  86.6  87.8   Hematocrit 39.0 - 52.0 % 40.0  39.1  36.5   Platelets 150 - 400 K/uL 315  328  292     Lab Results  Component Value Date   NA 138 12/16/2023   K 3.6 12/16/2023   CL 97 (L) 12/16/2023   CO2 26 12/16/2023      Assessment/Plan: AKI on CKD stage IIIa AKI likely hemodynamically mediated in the setting of diarrhea/diuretic/metformin  use. Thankfully with supportive care-renal function plateaued and now has started to improve. Volume status has improved-hold off on further diuretics today Continue to  avoid nephrotoxic agents  Non-anion gap metabolic acidosis Due to combination of diarrhea and AKI Improved-briefly required bicarb infusion Now on oral bicarb.  Hypokalemia Likely secondary to furosemide  Repleted  Hypomagnesemia Replete/recheck.  Acute metabolic encephalopathy Secondary to AKI Improved-answering questions appropriately.  Not clear if he is back to his baseline-no family at bedside.  Acute on chronic HFpEF More volume overloaded-suspect this is primarily due to IV fluid resuscitation Improving with diuresis On IV diuretics per nephrology Continue to follow electrolytes  Chronic atrial fibrillation Rate controlled Telemetry monitoring Continue metoprolol  Placed on IV heparin  as TDC insertion-was being contemplated for HD-now that renal function is improved-not felt to require HD-will stop heparin  and place back on Eliquis .  ?  UTI Has no symptoms-2 samples of UA with discordant results Since not symptomatic-has recent diagnosis of C. difficile-antibiotics stopped on 7/18-and being monitored off antibiotics.    Chronic diarrhea Ongoing for the past 3-4 months Repeat stool studies negative for C. difficile/infection Has essentially resolved. Continue with as needed Imodium . Due to improvement-suspect we can hold off on GI evaluation at this point.   Recent history of C. difficile diarrhea Completed course of oral vancomycin -repeat stool studies negative. Minimize use of antibiotics as much as possible  HLD Zetia   DM-2 (A1c 7.5 on 4/11) CBG stable SSI for now.   Recent Labs    12/15/23 1600 12/15/23 2108 12/16/23 0731  GLUCAP 133* 152* 122*  Peripheral neuropathy Lyrica .  History of hepatocellular carcinoma History of prostate cancer-s/p prostatectomy Outpatient oncology/urology follow-up.  Recent history of L4 vertebral body compression fracture Repeat CT this admit-with lytic component-with concern for metastatic disease. Continue  TSLO brace-outpatient neurosurgical follow-up  Mood disorder Relatively stable Continue Prozac /trazodone .  OSA CPAP nightly  Chronic debility/deconditioning Per PT note 7/22-at baseline-long-term care resident.  Class 3 Obesity: Estimated body mass index is 40.63 kg/m as calculated from the following:   Height as of 11/10/23: 5' 9 (1.753 m).   Weight as of this encounter: 124.8 kg.   Code status:   Code Status: Limited: Do not attempt resuscitation (DNR) -DNR-LIMITED -Do Not Intubate/DNI    DVT Prophylaxis: apixaban  (ELIQUIS ) tablet 2.5 mg Start: 12/15/23 1000 apixaban  (ELIQUIS ) tablet 2.5 mg    Family Communication: Daughter-Beverly-[phone] updated on 7/23   Disposition Plan: Status is: Inpatient Remains inpatient appropriate because: Severity of illness   Planned Discharge Destination:Skilled nursing facility   Diet: Diet Order             Diet heart healthy/carb modified Room service appropriate? No; Fluid consistency: Thin  Diet effective now                     Antimicrobial agents: Anti-infectives (From admission, onward)    Start     Dose/Rate Route Frequency Ordered Stop   12/10/23 2300  cefTRIAXone  (ROCEPHIN ) 1 g in sodium chloride  0.9 % 100 mL IVPB  Status:  Discontinued        1 g 200 mL/hr over 30 Minutes Intravenous Every 24 hours 12/10/23 0353 12/11/23 1013   12/09/23 2300  cefTRIAXone  (ROCEPHIN ) 2 g in sodium chloride  0.9 % 100 mL IVPB        2 g 200 mL/hr over 30 Minutes Intravenous  Once 12/09/23 2249 12/10/23 0117        MEDICATIONS: Scheduled Meds:  apixaban   2.5 mg Oral BID   Chlorhexidine  Gluconate Cloth  6 each Topical Daily   ezetimibe   10 mg Oral q AM   FLUoxetine   10 mg Oral QPM   insulin  aspart  0-5 Units Subcutaneous QHS   insulin  aspart  0-9 Units Subcutaneous TID WC   metoprolol  succinate  100 mg Oral BID   pantoprazole   40 mg Oral Daily   pregabalin   50 mg Oral BID   traZODone   25 mg Oral QHS   Continuous  Infusions:  magnesium  sulfate bolus IVPB 4 g (12/16/23 1046)     PRN Meds:.acetaminophen  **OR** acetaminophen , guaiFENesin , hydrALAZINE , ipratropium-albuterol , loperamide , ondansetron  (ZOFRAN ) IV, senna-docusate   I have personally reviewed following labs and imaging studies  LABORATORY DATA: CBC: Recent Labs  Lab 12/09/23 1601 12/09/23 1629 12/12/23 0549 12/13/23 0616 12/14/23 0610 12/15/23 0543 12/16/23 0437  WBC 11.5*   < > 6.9 7.9 7.6 9.2 10.1  NEUTROABS 7.7  --   --   --   --   --   --   HGB 12.9*   < > 11.8* 12.4* 12.1* 13.3 13.3  HCT 40.5   < > 35.5* 37.5* 36.5* 39.1 40.0  MCV 87.3   < > 84.3 83.9 83.3 82.8 83.2  PLT 436*   < > 287 298 292 328 315   < > = values in this interval not displayed.    Basic Metabolic Panel: Recent Labs  Lab 12/11/23 0543 12/12/23 0549 12/13/23 0616 12/14/23 0610 12/15/23 0543 12/16/23 0437  NA 139 137 138 139 137 138  K 3.5 3.3*  3.6 3.6 3.0* 3.6  CL 112* 100 102 99 96* 97*  CO2 12* 19* 23 26 26 26   GLUCOSE 92 96 97 111* 110* 127*  BUN 51* 51* 49* 46* 44* 42*  CREATININE 6.06* 6.71* 6.23* 5.41* 4.81* 4.16*  CALCIUM  7.6* 8.2* 8.6* 8.5* 9.0 9.1  MG 1.7 1.8 1.7 1.7 1.5* 1.5*  PHOS 4.8*  --  4.3 4.1 3.9 3.6    GFR: Estimated Creatinine Clearance: 18.2 mL/min (A) (by C-G formula based on SCr of 4.16 mg/dL (H)).  Liver Function Tests: Recent Labs  Lab 12/09/23 1601 12/12/23 0549 12/13/23 0616 12/14/23 0610 12/15/23 0543 12/16/23 0437  AST 20  --   --   --   --   --   ALT 10  --   --   --   --   --   ALKPHOS 95  --   --   --   --   --   BILITOT 0.6  --   --   --   --   --   PROT 5.5*  --   --   --   --   --   ALBUMIN  2.8* 2.5* 2.5* 2.6* 2.8* 2.9*   No results for input(s): LIPASE, AMYLASE in the last 168 hours. Recent Labs  Lab 12/09/23 1601  AMMONIA 14    Coagulation Profile: No results for input(s): INR, PROTIME in the last 168 hours.  Cardiac Enzymes: No results for input(s): CKTOTAL, CKMB,  CKMBINDEX, TROPONINI in the last 168 hours.  BNP (last 3 results) No results for input(s): PROBNP in the last 8760 hours.  Lipid Profile: No results for input(s): CHOL, HDL, LDLCALC, TRIG, CHOLHDL, LDLDIRECT in the last 72 hours.  Thyroid  Function Tests: No results for input(s): TSH, T4TOTAL, FREET4, T3FREE, THYROIDAB in the last 72 hours.  Anemia Panel: No results for input(s): VITAMINB12, FOLATE, FERRITIN, TIBC, IRON, RETICCTPCT in the last 72 hours.  Urine analysis:    Component Value Date/Time   COLORURINE YELLOW 12/10/2023 0854   APPEARANCEUR CLOUDY (A) 12/10/2023 0854   LABSPEC 1.020 12/10/2023 0854   PHURINE 5.5 12/10/2023 0854   GLUCOSEU NEGATIVE 12/10/2023 0854   HGBUR LARGE (A) 12/10/2023 0854   BILIRUBINUR NEGATIVE 12/10/2023 0854   KETONESUR NEGATIVE 12/10/2023 0854   PROTEINUR 30 (A) 12/10/2023 0854   NITRITE NEGATIVE 12/10/2023 0854   LEUKOCYTESUR SMALL (A) 12/10/2023 0854    Sepsis Labs: Lactic Acid, Venous    Component Value Date/Time   LATICACIDVEN 2.7 (HH) 12/09/2023 1955    MICROBIOLOGY: Recent Results (from the past 240 hours)  Blood culture (routine x 2)     Status: None   Collection Time: 12/09/23  4:20 PM   Specimen: BLOOD LEFT FOREARM  Result Value Ref Range Status   Specimen Description BLOOD LEFT FOREARM  Final   Special Requests   Final    BOTTLES DRAWN AEROBIC AND ANAEROBIC Blood Culture adequate volume   Culture   Final    NO GROWTH 5 DAYS Performed at [facility], [address], [place], [place] [address]    Report Status 12/14/2023 FINAL  Final  Blood culture (routine x 2)     Status: None   Collection Time: 12/09/23  4:44 PM   Specimen: BLOOD RIGHT ARM  Result Value Ref Range Status   Specimen Description BLOOD RIGHT ARM  Final   Special Requests   Final    BOTTLES DRAWN AEROBIC AND ANAEROBIC Blood Culture adequate volume  Culture   Final    NO GROWTH 5 DAYS Performed at [facility], [address], [place], [place] [address]    Report Status 12/14/2023 FINAL  Final  Urine Culture     Status: Abnormal   Collection Time: 12/09/23 10:30 PM   Specimen: Urine, Random  Result Value Ref Range Status   Specimen Description URINE, RANDOM  Final   Special Requests   Final    NONE Reflexed from [id number] Performed at [facility], [address], [place], [place] [address]    Culture MULTIPLE SPECIES PRESENT, SUGGEST RECOLLECTION (A)  Final   Report Status 12/10/2023 FINAL  Final  C Difficile Quick Screen w PCR reflex     Status: None   Collection Time: 12/10/23  9:50 AM   Specimen: STOOL  Result Value Ref Range Status   C Diff antigen NEGATIVE NEGATIVE Final   C Diff toxin NEGATIVE NEGATIVE Final   C Diff interpretation No C. difficile detected.  Final    Comment: Performed at [facility], [address], [place], [place] [address]  Gastrointestinal Panel by PCR , Stool     Status: None   Collection Time: 12/10/23  1:34 PM   Specimen: Stool  Result Value Ref Range Status   Campylobacter species NOT DETECTED NOT DETECTED Final   Plesimonas shigelloides NOT DETECTED NOT DETECTED Final   Salmonella species NOT DETECTED NOT DETECTED Final   Yersinia enterocolitica NOT DETECTED NOT DETECTED Final   Vibrio species NOT DETECTED NOT DETECTED Final   Vibrio cholerae NOT DETECTED NOT DETECTED Final   Enteroaggregative E coli (EAEC) NOT DETECTED NOT DETECTED Final   Enteropathogenic E coli (EPEC) NOT DETECTED NOT DETECTED Final   Enterotoxigenic E coli (ETEC) NOT DETECTED NOT DETECTED Final   Shiga like toxin producing E coli (STEC) NOT DETECTED NOT DETECTED Final   Shigella/Enteroinvasive E coli (EIEC) NOT DETECTED NOT DETECTED Final   Cryptosporidium NOT DETECTED NOT DETECTED Final   Cyclospora cayetanensis NOT DETECTED NOT DETECTED Final   Entamoeba histolytica NOT DETECTED NOT DETECTED Final   Giardia lamblia NOT DETECTED NOT DETECTED Final    Adenovirus F40/41 NOT DETECTED NOT DETECTED Final   Astrovirus NOT DETECTED NOT DETECTED Final   Norovirus GI/GII NOT DETECTED NOT DETECTED Final   Rotavirus A NOT DETECTED NOT DETECTED Final   Sapovirus (I, II, IV, and V) NOT DETECTED NOT DETECTED Final    Comment: Performed at [facility], [address] [place], [place] [address]  MRSA Next Gen by PCR, Nasal     Status: None   Collection Time: 12/10/23  5:13 PM   Specimen: Nasal Mucosa; Nasal Swab  Result Value Ref Range Status   MRSA by PCR Next Gen NOT DETECTED NOT DETECTED Final    Comment: (NOTE) The GeneXpert MRSA Assay (FDA approved for NASAL specimens only), is one component of a comprehensive MRSA colonization surveillance program. It is not intended to diagnose MRSA infection nor to guide or monitor treatment for MRSA infections. Test performance is not FDA approved in patients less than 62 years old. Performed at [facility], [address], [place], [place] [address]     RADIOLOGY STUDIES/RESULTS: No results found.    LOS: 6 days   Donalda Applebaum, MD  [facility]    To contact the attending provider between 7A-7P or the covering provider during after hours 7P-7A, please log into the web site [email or web address] and access using  universal [place] password for that web site. If you do not have the password, please call the hospital operator.  12/16/2023, 10:55 AM

## 2023-12-16 NOTE — Progress Notes (Signed)
 OT Cancellation Note  Patient Details Name: Steve Jensen MRN: [id number] DOB: [birth year]   Cancelled Treatment:    Reason Eval/Treat Not Completed: OT screened, no needs identified, will sign off. Pt previously evaluated by OT and found to be at his baseline in ADLs and mobility. Pt is a resident of SNF and requires hoyer lift for OOB and is dependent in bathing, dressing and toileting.   Kennth Mliss Helling 12/16/2023, 8:03 AM Mliss HERO, OTR/L Acute Rehabilitation Services Office: [phone]

## 2023-12-16 NOTE — Progress Notes (Signed)
 Patient ID: ASPEN DETERDING, male   DOB: 16-Aug-1940, 83 y.o.   MRN: [id number] [facility] Progress Note   Assessment/ Plan:   1. Acute kidney Injury on chronic kidney disease stage IIIa: Baseline creatinine ranging 1.2-1.3 and acute kidney injury is suspected to have been from ischemic ATN with sustained prerenal state.  He remains nonoliguric with continued improvement of renal function/downtrending creatinine and without any indications for dialysis.  Nephrology will sign off at this time with renal recovery underway and recommend continued follow-up of labs by his PCP following discharge (with the option for nephrology follow-up if renal function does not completely improved back to baseline). 2.  Acute exacerbation of congestive heart failure: Appears to be currently compensated.  Continue intermittent diuretic dosing for augmenting urine output/volume unloading status post acute kidney injury. 3.  Metabolic acidosis: Secondary to diarrhea and acute kidney injury, improved status post previous sodium bicarbonate  supplementation. 4.  Acute metabolic encephalopathy: Appears to have resolved and currently thought to be back to baseline. 5.  Chronic diarrhea: Testing negative for C. difficile infection following recent treatment/history treated with oral vancomycin .  Subjective:   Reports to be feeling better this morning and does not voice any active complaints.   Objective:   BP 115/66 (BP Location: Right Arm)   Pulse 79   Temp 97.6 F (36.4 C) (Oral)   Resp 17   Wt 124.8 kg   SpO2 90%   BMI 40.63 kg/m   Intake/Output Summary (Last 24 hours) at 12/16/2023 0949 Last data filed at 12/16/2023 0500 Gross per 24 hour  Intake --  Output 3425 ml  Net -3425 ml   Weight change:   Physical Exam: Gen: Resting comfortably in bed, watching television CVS: Pulse regular rhythm, normal rate, S1 and S2 normal Resp: Poor respiratory effort with coarse breath sounds bilaterally Abd:  Soft, obese, nontender, bowel sounds normal Ext: 1-2+ pitting edema over upper and lower extremities  Imaging: No results found.  Labs: BMET Recent Labs  Lab 12/10/23 1538 12/11/23 0543 12/12/23 0549 12/13/23 0616 12/14/23 0610 12/15/23 0543 12/16/23 0437  NA 135 139 137 138 139 137 138  K 3.6 3.5 3.3* 3.6 3.6 3.0* 3.6  CL 105 112* 100 102 99 96* 97*  CO2 17* 12* 19* 23 26 26 26   GLUCOSE 113* 92 96 97 111* 110* 127*  BUN 54* 51* 51* 49* 46* 44* 42*  CREATININE 6.41* 6.06* 6.71* 6.23* 5.41* 4.81* 4.16*  CALCIUM  8.6* 7.6* 8.2* 8.6* 8.5* 9.0 9.1  PHOS  --  4.8*  --  4.3 4.1 3.9 3.6   CBC Recent Labs  Lab 12/09/23 1601 12/09/23 1629 12/13/23 0616 12/14/23 0610 12/15/23 0543 12/16/23 0437  WBC 11.5*   < > 7.9 7.6 9.2 10.1  NEUTROABS 7.7  --   --   --   --   --   HGB 12.9*   < > 12.4* 12.1* 13.3 13.3  HCT 40.5   < > 37.5* 36.5* 39.1 40.0  MCV 87.3   < > 83.9 83.3 82.8 83.2  PLT 436*   < > 298 292 328 315   < > = values in this interval not displayed.    Medications:     apixaban   2.5 mg Oral BID   Chlorhexidine  Gluconate Cloth  6 each Topical Daily   ezetimibe   10 mg Oral q AM   FLUoxetine   10 mg Oral QPM   insulin  aspart  0-5 Units Subcutaneous QHS  insulin  aspart  0-9 Units Subcutaneous TID WC   metoprolol  succinate  100 mg Oral BID   pantoprazole   40 mg Oral Daily   pregabalin   50 mg Oral BID   traZODone   25 mg Oral QHS    Gordy Blanch, MD 12/16/2023, 9:49 AM

## 2023-12-17 DIAGNOSIS — N179 Acute kidney failure, unspecified: Secondary | ICD-10-CM | POA: Diagnosis not present

## 2023-12-17 DIAGNOSIS — E8721 Acute metabolic acidosis: Secondary | ICD-10-CM | POA: Diagnosis not present

## 2023-12-17 DIAGNOSIS — A0472 Enterocolitis due to Clostridium difficile, not specified as recurrent: Secondary | ICD-10-CM | POA: Diagnosis not present

## 2023-12-17 DIAGNOSIS — G9341 Metabolic encephalopathy: Secondary | ICD-10-CM | POA: Diagnosis not present

## 2023-12-17 LAB — RENAL FUNCTION PANEL
Albumin: 2.9 g/dL — ABNORMAL LOW (ref 3.5–5.0)
Anion gap: 14 (ref 5–15)
BUN: 39 mg/dL — ABNORMAL HIGH (ref 8–23)
CO2: 28 mmol/L (ref 22–32)
Calcium: 9.3 mg/dL (ref 8.9–10.3)
Chloride: 99 mmol/L (ref 98–111)
Creatinine, Ser: 3.71 mg/dL — ABNORMAL HIGH (ref 0.61–1.24)
GFR, Estimated: 16 mL/min — ABNORMAL LOW (ref >60)
Glucose, Bld: 119 mg/dL — ABNORMAL HIGH (ref 70–99)
Phosphorus: 4.2 mg/dL (ref 2.5–4.6)
Potassium: 3.4 mmol/L — ABNORMAL LOW (ref 3.5–5.1)
Sodium: 141 mmol/L (ref 135–145)

## 2023-12-17 LAB — GLUCOSE, CAPILLARY
Glucose-Capillary: 146 mg/dL — ABNORMAL HIGH (ref 70–99)
Glucose-Capillary: 203 mg/dL — ABNORMAL HIGH (ref 70–99)
Glucose-Capillary: 152 mg/dL — ABNORMAL HIGH (ref 70–99)
Glucose-Capillary: 115 mg/dL — ABNORMAL HIGH (ref 70–99)

## 2023-12-17 LAB — MAGNESIUM: Magnesium: 2.2 mg/dL (ref 1.7–2.4)

## 2023-12-17 MED ORDER — IPRATROPIUM-ALBUTEROL 0.5-2.5 (3) MG/3ML IN SOLN
3.0000 mL | Freq: Three times a day (TID) | RESPIRATORY_TRACT | Status: DC
  Administered 2023-12-17 – 2023-12-18 (×2): 3 mL via RESPIRATORY_TRACT
  Filled 2023-12-17 (×2): qty 3

## 2023-12-17 MED ORDER — FUROSEMIDE 10 MG/ML IJ SOLN
80.0000 mg | Freq: Once | INTRAMUSCULAR | Status: AC
  Administered 2023-12-17: 80 mg via INTRAVENOUS
  Filled 2023-12-17 (×2): qty 8

## 2023-12-17 MED ORDER — POTASSIUM CHLORIDE CRYS ER 20 MEQ PO TBCR
40.0000 meq | EXTENDED_RELEASE_TABLET | Freq: Once | ORAL | Status: AC
  Administered 2023-12-17: 40 meq via ORAL
  Filled 2023-12-17: qty 2

## 2023-12-17 MED ORDER — IPRATROPIUM-ALBUTEROL 0.5-2.5 (3) MG/3ML IN SOLN: 3.0000 mL | Freq: Three times a day (TID) | RESPIRATORY_TRACT | Status: DC

## 2023-12-17 NOTE — NC FL2 (Signed)
 [place]  MEDICAID FL2 LEVEL OF CARE FORM     IDENTIFICATION  Patient Name: Steve Jensen Birthdate: [birth year] Sex: male Admission Date (Current Location): 12/09/2023  [facility] and [place] Number:  Producer, television/film/video and Address:  [facility], [address] [place], [place] [address]      Provider Number: [id number]  Attending Physician Name and Address:  Raenelle Donalda HERO, MD  Relative Name and Phone Number:       Current Level of Care: Hospital Recommended Level of Care: Skilled Nursing Facility Prior Approval Number:    Date Approved/Denied:   PASRR Number: [id number]  Discharge Plan: SNF    Current Diagnoses: Patient Active Problem List   Diagnosis Date Noted   CHF exacerbation (HCC) 12/10/2023   UTI (urinary tract infection) 12/10/2023   C. difficile diarrhea 11/10/2023   Acute metabolic encephalopathy 11/09/2023   CKD stage 3b, GFR 30-44 ml/min (HCC) 11/09/2023   Lumbar compression fracture (HCC) 11/09/2023   Gout 11/09/2023   Acute metabolic acidosis 10/24/2023   SOB (shortness of breath) 10/24/2023   AKI (acute kidney injury) (HCC) 09/04/2023   Long term (current) use of anticoagulants 09/04/2023   Chronic heart failure with preserved ejection fraction (HFpEF) (HCC) 09/04/2023   Hypokalemia 11/11/2022   Ileus (HCC)    Uncontrolled type 2 diabetes mellitus with hyperglycemia, without long-term current use of insulin  (HCC) 12/17/2021   Osteoarthritis of right knee 12/17/2021   HCC (hepatocellular carcinoma) (HCC) 02/15/2018   Hepatocellular carcinoma (HCC) 12/30/2017   Pain in joint of left shoulder 11/09/2017   Hypertensive heart disease with heart failure (HCC) 03/23/2017   Chronic diastolic heart failure (HCC) 03/19/2015   PAF (paroxysmal atrial fibrillation) (HCC) 08/11/2013   S/P left knee arthroscopy 07/11/2013   Mixed hyperlipidemia 03/03/2013   Essential hypertension, benign 03/03/2013   Malignant neoplasm of  prostate (HCC) 03/03/2013   Osteoarthrosis, unspecified whether generalized or localized, other specified sites 03/03/2013   Allergic rhinitis 03/03/2013   Type 2 diabetes mellitus (HCC) 03/03/2013   Diabetic polyneuropathy (HCC) 03/03/2013   OSA (obstructive sleep apnea) 03/03/2013   Proteinuria 03/03/2013   Morbid obesity (HCC) 03/03/2013   Right knee pain 02/07/2013    Orientation RESPIRATION BLADDER Height & Weight     Self, Place  Normal Incontinent, External catheter Weight: 275 lb 2.2 oz (124.8 kg) Height:     BEHAVIORAL SYMPTOMS/MOOD NEUROLOGICAL BOWEL NUTRITION STATUS      Incontinent Diet (See dc summary)  AMBULATORY STATUS COMMUNICATION OF NEEDS Skin   Total Care Verbally Normal                       Personal Care Assistance Level of Assistance  Bathing, Feeding, Dressing Bathing Assistance: Maximum assistance Feeding assistance: Maximum assistance Dressing Assistance: Maximum assistance     Functional Limitations Info  Hearing   Hearing Info: Impaired      SPECIAL CARE FACTORS FREQUENCY                       Contractures      Additional Factors Info  Code Status, Allergies, Psychotropic, Insulin  Sliding Scale Code Status Info: DNR Allergies Info: Lipitor (Atorvastatin), Zithromax (Azithromycin), Zocor (Simvastatin), Dificid  (Fidaxomicin ), Norvasc  (Amlodipine ) Psychotropic Info: See dc summary Insulin  Sliding Scale Info: See dc summary       Current Medications (12/17/2023):  This is the current hospital active medication list Current Facility-Administered Medications  Medication Dose Route Frequency Provider  Last Rate Last Admin   acetaminophen  (TYLENOL ) tablet 650 mg  650 mg Oral Q6H PRN Alfornia Madison, MD       Or   acetaminophen  (TYLENOL ) suppository 650 mg  650 mg Rectal Q6H PRN Alfornia Madison, MD       apixaban  (ELIQUIS ) tablet 2.5 mg  2.5 mg Oral BID Labi, Vanna L, RPH   2.5 mg at 12/16/23 2043   Chlorhexidine  Gluconate  Cloth 2 % PADS 6 each  6 each Topical Daily Amin, Ankit C, MD   6 each at 12/16/23 1051   ezetimibe  (ZETIA ) tablet 10 mg  10 mg Oral q AM Rathore, Vasundhra, MD   10 mg at 12/16/23 1045   FLUoxetine  (PROZAC ) capsule 10 mg  10 mg Oral QPM Rathore, Vasundhra, MD   10 mg at 12/16/23 1727   guaiFENesin  (ROBITUSSIN) 100 MG/5ML liquid 5 mL  5 mL Oral Q4H PRN Amin, Ankit C, MD   5 mL at 12/11/23 2110   hydrALAZINE  (APRESOLINE ) injection 10 mg  10 mg Intravenous Q4H PRN Amin, Ankit C, MD       insulin  aspart (novoLOG ) injection 0-5 Units  0-5 Units Subcutaneous QHS Alfornia Madison, MD       insulin  aspart (novoLOG ) injection 0-9 Units  0-9 Units Subcutaneous TID WC Rathore, Vasundhra, MD   1 Units at 12/16/23 1727   ipratropium-albuterol  (DUONEB) 0.5-2.5 (3) MG/3ML nebulizer solution 3 mL  3 mL Nebulization Q4H PRN Amin, Ankit C, MD       loperamide  (IMODIUM ) capsule 2 mg  2 mg Oral PRN Ghimire, Shanker M, MD   2 mg at 12/12/23 2113   metoprolol  succinate (TOPROL -XL) 24 hr tablet 100 mg  100 mg Oral BID Rathore, Vasundhra, MD   100 mg at 12/16/23 2043   ondansetron  (ZOFRAN ) injection 4 mg  4 mg Intravenous Q6H PRN Amin, Ankit C, MD       pantoprazole  (PROTONIX ) EC tablet 40 mg  40 mg Oral Daily Rathore, Vasundhra, MD   40 mg at 12/16/23 1045   potassium chloride  SA (KLOR-CON  M) CR tablet 40 mEq  40 mEq Oral Once Ghimire, Shanker M, MD       pregabalin  (LYRICA ) capsule 50 mg  50 mg Oral BID Rathore, Vasundhra, MD   50 mg at 12/16/23 2044   senna-docusate (Senokot-S) tablet 1 tablet  1 tablet Oral QHS PRN Amin, Ankit C, MD       tamsulosin  (FLOMAX ) capsule 0.4 mg  0.4 mg Oral Daily Ghimire, Shanker M, MD   0.4 mg at 12/16/23 1317   traZODone  (DESYREL ) tablet 25 mg  25 mg Oral QHS Rathore, Vasundhra, MD   25 mg at 12/16/23 2043     Discharge Medications: Please see discharge summary for a list of discharge medications.  Relevant Imaging Results:  Relevant Lab Results:   Additional  Information [id number]-[id number]  Lillyanna Glandon S Joshau Code, LCSW

## 2023-12-17 NOTE — Plan of Care (Signed)

## 2023-12-17 NOTE — Progress Notes (Signed)
 PROGRESS NOTE        PATIENT DETAILS Name: Steve Jensen Age: 84 y.o. Sex: male Date of Birth: 1940/11/23 Admit Date: 12/09/2023 Admitting Physician Editha Ram, MD ERE:[email or web address], Provider Not In  Brief Summary: Patient is a 83 y.o.  male with history of A-fib, HTN, chronic HFrEF, PAF, CKD stage IIIa, hepatocellular CA, prostate CA, OSA on CPAP-presented to the hospital with confusion-found to have AKI.  Significant events: 7/17>> admit to [facility].  Significant studies: 7/16>>CT Head:no acute intracranial pathology 7/16>>CT Renal Stone study: No hydronephrosis. 7/16>> CXR: No PNA  Significant microbiology data: 7/16>> urine culture: Multiple species. 7/16>> blood culture: No growth 7/17>> GI pathogen panel: Negative 7/17>> stool C. difficile PCR: Negative  Procedures: None  Consults: Renal  Subjective: Sleeping comfortably when I first saw him this morning-no major issues overnight-some intermittent cough continues.  Objective: Vitals: Blood pressure 106/75, pulse 90, temperature (!) 97.5 F (36.4 C), temperature source Oral, resp. rate 10, weight 124.8 kg, SpO2 92%.   Exam: Awake/alert-frail-appearing Chest: Some scattered wheezing CVS: S1-S2 regular Extremities: + edema Neurology: Nonfocal but generalized weakness.  Pertinent Labs/Radiology:    Latest Ref Rng & Units 12/16/2023    4:37 AM 12/15/2023    5:43 AM 12/14/2023    6:10 AM  CBC  WBC 4.0 - 10.5 K/uL 10.1  9.2  7.6   Hemoglobin 13.0 - 17.0 g/dL 86.6  86.6  87.8   Hematocrit 39.0 - 52.0 % 40.0  39.1  36.5   Platelets 150 - 400 K/uL 315  328  292     Lab Results  Component Value Date   NA 141 12/17/2023   K 3.4 (L) 12/17/2023   CL 99 12/17/2023   CO2 28 12/17/2023      Assessment/Plan: AKI on CKD stage IIIa AKI likely hemodynamically mediated in the setting of diarrhea/diuretic/metformin  use. Thankfully with supportive care-renal function plateaued and now has  started to improve. Sounds a bit more congested today-continues to have some leg edema-repeat 1 dose of IV Lasix  today Continue to avoid nephrotoxic agents Repeat electrolytes tomorrow.  Non-anion gap metabolic acidosis Due to combination of diarrhea and AKI Improved-briefly required bicarb infusion Now on oral bicarb.  Hypokalemia Likely secondary to furosemide  Replete/recheck.  Hypomagnesemia Repleted.  Acute metabolic encephalopathy Secondary to AKI Improved-answering questions appropriately.  Not clear if he is back to his baseline-no family at bedside.  Acute on chronic HFpEF Continues to have some mild volume overload Repeating IV Lasix  today  Chronic atrial fibrillation Rate controlled Telemetry monitoring Continue metoprolol  No longer on IV heparin -back on Eliquis .  ?  UTI Has no symptoms-2 samples of UA with discordant results Since not symptomatic-has recent diagnosis of C. difficile-antibiotics stopped on 7/18-and being monitored off antibiotics.    Chronic diarrhea Ongoing for the past 3-4 months Repeat stool studies negative for C. difficile/infection Has essentially resolved. Continue with as needed Imodium . Due to improvement-suspect we can hold off on GI evaluation at this point.   Recent history of C. difficile diarrhea Completed course of oral vancomycin -repeat stool studies negative. Minimize use of antibiotics as much as possible  HLD Zetia   DM-2 (A1c 7.5 on 4/11) CBG stable SSI for now.   Recent Labs    12/16/23 1206 12/16/23 2121 12/17/23 0746  GLUCAP 141* 150* 146*    Peripheral neuropathy Lyrica .  History of hepatocellular carcinoma  History of prostate cancer-s/p prostatectomy Outpatient oncology/urology follow-up.  Recent history of L4 vertebral body compression fracture Repeat CT this admit-with lytic component-with concern for metastatic disease. Continue TSLO brace-outpatient neurosurgical follow-up  Mood  disorder Relatively stable Continue Prozac /trazodone .  OSA CPAP nightly  Chronic debility/deconditioning Per PT note 7/22-at baseline-long-term care resident.  Class 3 Obesity: Estimated body mass index is 40.63 kg/m as calculated from the following:   Height as of 11/10/23: 5' 9 (1.753 m).   Weight as of this encounter: 124.8 kg.   Code status:   Code Status: Limited: Do not attempt resuscitation (DNR) -DNR-LIMITED -Do Not Intubate/DNI    DVT Prophylaxis: apixaban  (ELIQUIS ) tablet 2.5 mg Start: 12/15/23 1000 apixaban  (ELIQUIS ) tablet 2.5 mg    Family Communication: Daughter-[phone] updated on 7/24   Disposition Plan: Status is: Inpatient Remains inpatient appropriate because: Severity of illness   Planned Discharge Destination:Skilled nursing facility   Diet: Diet Order             Diet heart healthy/carb modified Room service appropriate? No; Fluid consistency: Thin  Diet effective now                     Antimicrobial agents: Anti-infectives (From admission, onward)    Start     Dose/Rate Route Frequency Ordered Stop   12/10/23 2300  cefTRIAXone  (ROCEPHIN ) 1 g in sodium chloride  0.9 % 100 mL IVPB  Status:  Discontinued        1 g 200 mL/hr over 30 Minutes Intravenous Every 24 hours 12/10/23 0353 12/11/23 1013   12/09/23 2300  cefTRIAXone  (ROCEPHIN ) 2 g in sodium chloride  0.9 % 100 mL IVPB        2 g 200 mL/hr over 30 Minutes Intravenous  Once 12/09/23 2249 12/10/23 0117        MEDICATIONS: Scheduled Meds:  apixaban   2.5 mg Oral BID   Chlorhexidine  Gluconate Cloth  6 each Topical Daily   ezetimibe   10 mg Oral q AM   FLUoxetine   10 mg Oral QPM   insulin  aspart  0-5 Units Subcutaneous QHS   insulin  aspart  0-9 Units Subcutaneous TID WC   ipratropium-albuterol   3 mL Nebulization TID   metoprolol  succinate  100 mg Oral BID   pantoprazole   40 mg Oral Daily   pregabalin   50 mg Oral BID   tamsulosin   0.4 mg Oral Daily   traZODone   25  mg Oral QHS   Continuous Infusions:     PRN Meds:.acetaminophen  **OR** acetaminophen , guaiFENesin , hydrALAZINE , ipratropium-albuterol , loperamide , ondansetron  (ZOFRAN ) IV, senna-docusate   I have personally reviewed following labs and imaging studies  LABORATORY DATA: CBC: Recent Labs  Lab 12/12/23 0549 12/13/23 0616 12/14/23 0610 12/15/23 0543 12/16/23 0437  WBC 6.9 7.9 7.6 9.2 10.1  HGB 11.8* 12.4* 12.1* 13.3 13.3  HCT 35.5* 37.5* 36.5* 39.1 40.0  MCV 84.3 83.9 83.3 82.8 83.2  PLT 287 298 292 328 315    Basic Metabolic Panel: Recent Labs  Lab 12/13/23 0616 12/14/23 0610 12/15/23 0543 12/16/23 0437 12/17/23 0433  NA 138 139 137 138 141  K 3.6 3.6 3.0* 3.6 3.4*  CL 102 99 96* 97* 99  CO2 23 26 26 26 28   GLUCOSE 97 111* 110* 127* 119*  BUN 49* 46* 44* 42* 39*  CREATININE 6.23* 5.41* 4.81* 4.16* 3.71*  CALCIUM  8.6* 8.5* 9.0 9.1 9.3  MG 1.7 1.7 1.5* 1.5* 2.2  PHOS 4.3 4.1 3.9 3.6 4.2    GFR: Estimated Creatinine  Clearance: 20.4 mL/min (A) (by C-G formula based on SCr of 3.71 mg/dL (H)).  Liver Function Tests: Recent Labs  Lab 12/13/23 0616 12/14/23 0610 12/15/23 0543 12/16/23 0437 12/17/23 0433  ALBUMIN  2.5* 2.6* 2.8* 2.9* 2.9*   No results for input(s): LIPASE, AMYLASE in the last 168 hours. No results for input(s): AMMONIA in the last 168 hours.   Coagulation Profile: No results for input(s): INR, PROTIME in the last 168 hours.  Cardiac Enzymes: No results for input(s): CKTOTAL, CKMB, CKMBINDEX, TROPONINI in the last 168 hours.  BNP (last 3 results) No results for input(s): PROBNP in the last 8760 hours.  Lipid Profile: No results for input(s): CHOL, HDL, LDLCALC, TRIG, CHOLHDL, LDLDIRECT in the last 72 hours.  Thyroid  Function Tests: No results for input(s): TSH, T4TOTAL, FREET4, T3FREE, THYROIDAB in the last 72 hours.  Anemia Panel: No results for input(s): VITAMINB12, FOLATE, FERRITIN,  TIBC, IRON, RETICCTPCT in the last 72 hours.  Urine analysis:    Component Value Date/Time   COLORURINE YELLOW 12/10/2023 0854   APPEARANCEUR CLOUDY (A) 12/10/2023 0854   LABSPEC 1.020 12/10/2023 0854   PHURINE 5.5 12/10/2023 0854   GLUCOSEU NEGATIVE 12/10/2023 0854   HGBUR LARGE (A) 12/10/2023 0854   BILIRUBINUR NEGATIVE 12/10/2023 0854   KETONESUR NEGATIVE 12/10/2023 0854   PROTEINUR 30 (A) 12/10/2023 0854   NITRITE NEGATIVE 12/10/2023 0854   LEUKOCYTESUR SMALL (A) 12/10/2023 0854    Sepsis Labs: Lactic Acid, Venous    Component Value Date/Time   LATICACIDVEN 2.7 (HH) 12/09/2023 1955    MICROBIOLOGY: Recent Results (from the past 240 hours)  Blood culture (routine x 2)     Status: None   Collection Time: 12/09/23  4:20 PM   Specimen: BLOOD LEFT FOREARM  Result Value Ref Range Status   Specimen Description BLOOD LEFT FOREARM  Final   Special Requests   Final    BOTTLES DRAWN AEROBIC AND ANAEROBIC Blood Culture adequate volume   Culture   Final    NO GROWTH 5 DAYS Performed at [facility], [address] [place], [place] [address]    Report Status 12/14/2023 FINAL  Final  Blood culture (routine x 2)     Status: None   Collection Time: 12/09/23  4:44 PM   Specimen: BLOOD RIGHT ARM  Result Value Ref Range Status   Specimen Description BLOOD RIGHT ARM  Final   Special Requests   Final    BOTTLES DRAWN AEROBIC AND ANAEROBIC Blood Culture adequate volume   Culture   Final    NO GROWTH 5 DAYS Performed at [facility], [address], [place], [place] [address]    Report Status 12/14/2023 FINAL  Final  Urine Culture     Status: Abnormal   Collection Time: 12/09/23 10:30 PM   Specimen: Urine, Random  Result Value Ref Range Status   Specimen Description URINE, RANDOM  Final   Special Requests   Final    NONE Reflexed from [id number] Performed at [facility], [address], [place], [place] [address]    Culture MULTIPLE SPECIES PRESENT,  SUGGEST RECOLLECTION (A)  Final   Report Status 12/10/2023 FINAL  Final  C Difficile Quick Screen w PCR reflex     Status: None   Collection Time: 12/10/23  9:50 AM   Specimen: STOOL  Result Value Ref Range Status   C Diff antigen NEGATIVE NEGATIVE Final   C Diff toxin NEGATIVE NEGATIVE Final   C Diff interpretation  No C. difficile detected.  Final    Comment: Performed at [facility], [address], [place], [place] [address]  Gastrointestinal Panel by PCR , Stool     Status: None   Collection Time: 12/10/23  1:34 PM   Specimen: Stool  Result Value Ref Range Status   Campylobacter species NOT DETECTED NOT DETECTED Final   Plesimonas shigelloides NOT DETECTED NOT DETECTED Final   Salmonella species NOT DETECTED NOT DETECTED Final   Yersinia enterocolitica NOT DETECTED NOT DETECTED Final   Vibrio species NOT DETECTED NOT DETECTED Final   Vibrio cholerae NOT DETECTED NOT DETECTED Final   Enteroaggregative E coli (EAEC) NOT DETECTED NOT DETECTED Final   Enteropathogenic E coli (EPEC) NOT DETECTED NOT DETECTED Final   Enterotoxigenic E coli (ETEC) NOT DETECTED NOT DETECTED Final   Shiga like toxin producing E coli (STEC) NOT DETECTED NOT DETECTED Final   Shigella/Enteroinvasive E coli (EIEC) NOT DETECTED NOT DETECTED Final   Cryptosporidium NOT DETECTED NOT DETECTED Final   Cyclospora cayetanensis NOT DETECTED NOT DETECTED Final   Entamoeba histolytica NOT DETECTED NOT DETECTED Final   Giardia lamblia NOT DETECTED NOT DETECTED Final   Adenovirus F40/41 NOT DETECTED NOT DETECTED Final   Astrovirus NOT DETECTED NOT DETECTED Final   Norovirus GI/GII NOT DETECTED NOT DETECTED Final   Rotavirus A NOT DETECTED NOT DETECTED Final   Sapovirus (I, II, IV, and V) NOT DETECTED NOT DETECTED Final    Comment: Performed at [facility], [address] [place], [place] [address]  MRSA Next Gen by PCR, Nasal     Status: None   Collection Time: 12/10/23  5:13 PM   Specimen: Nasal  Mucosa; Nasal Swab  Result Value Ref Range Status   MRSA by PCR Next Gen NOT DETECTED NOT DETECTED Final    Comment: (NOTE) The GeneXpert MRSA Assay (FDA approved for NASAL specimens only), is one component of a comprehensive MRSA colonization surveillance program. It is not intended to diagnose MRSA infection nor to guide or monitor treatment for MRSA infections. Test performance is not FDA approved in patients less than 15 years old. Performed at [facility], [address], [place], [place] [address]     RADIOLOGY STUDIES/RESULTS: No results found.    LOS: 7 days   Donalda Applebaum, MD  [facility]    To contact the attending provider between 7A-7P or the covering provider during after hours 7P-7A, please log into the web site [email or web address] and access using universal [place] password for that web site. If you do not have the password, please call the hospital operator.  12/17/2023, 11:37 AM

## 2023-12-18 DIAGNOSIS — I10 Essential (primary) hypertension: Secondary | ICD-10-CM | POA: Diagnosis not present

## 2023-12-18 DIAGNOSIS — E1142 Type 2 diabetes mellitus with diabetic polyneuropathy: Secondary | ICD-10-CM | POA: Diagnosis not present

## 2023-12-18 DIAGNOSIS — I5033 Acute on chronic diastolic (congestive) heart failure: Secondary | ICD-10-CM | POA: Diagnosis not present

## 2023-12-18 DIAGNOSIS — N179 Acute kidney failure, unspecified: Secondary | ICD-10-CM | POA: Diagnosis not present

## 2023-12-18 DIAGNOSIS — G9341 Metabolic encephalopathy: Secondary | ICD-10-CM | POA: Diagnosis not present

## 2023-12-18 DIAGNOSIS — E119 Type 2 diabetes mellitus without complications: Secondary | ICD-10-CM | POA: Diagnosis not present

## 2023-12-18 LAB — BASIC METABOLIC PANEL WITH GFR
Anion gap: 16 — ABNORMAL HIGH (ref 5–15)
BUN: 38 mg/dL — ABNORMAL HIGH (ref 8–23)
CO2: 23 mmol/L (ref 22–32)
Calcium: 9.2 mg/dL (ref 8.9–10.3)
Chloride: 99 mmol/L (ref 98–111)
Creatinine, Ser: 3.53 mg/dL — ABNORMAL HIGH (ref 0.61–1.24)
GFR, Estimated: 17 mL/min — ABNORMAL LOW (ref >60)
Glucose, Bld: 129 mg/dL — ABNORMAL HIGH (ref 70–99)
Potassium: 3.3 mmol/L — ABNORMAL LOW (ref 3.5–5.1)
Sodium: 138 mmol/L (ref 135–145)

## 2023-12-18 LAB — GLUCOSE, CAPILLARY: Glucose-Capillary: 145 mg/dL — ABNORMAL HIGH (ref 70–99)

## 2023-12-18 MED ORDER — IPRATROPIUM-ALBUTEROL 0.5-2.5 (3) MG/3ML IN SOLN: 3.0000 mL | RESPIRATORY_TRACT | Status: AC | PRN

## 2023-12-18 MED ORDER — IPRATROPIUM-ALBUTEROL 0.5-2.5 (3) MG/3ML IN SOLN: 3.0000 mL | Freq: Three times a day (TID) | RESPIRATORY_TRACT | Status: AC

## 2023-12-18 MED ORDER — APIXABAN 2.5 MG PO TABS: 2.5000 mg | ORAL_TABLET | Freq: Two times a day (BID) | ORAL | Status: AC

## 2023-12-18 MED ORDER — TAMSULOSIN HCL 0.4 MG PO CAPS: 0.4000 mg | ORAL_CAPSULE | Freq: Every day | ORAL | Status: AC

## 2023-12-18 MED ORDER — LOPERAMIDE HCL 2 MG PO CAPS: 2.0000 mg | ORAL_CAPSULE | ORAL | Status: AC | PRN

## 2023-12-18 MED ORDER — POTASSIUM CHLORIDE CRYS ER 20 MEQ PO TBCR
40.0000 meq | EXTENDED_RELEASE_TABLET | Freq: Once | ORAL | Status: AC
  Administered 2023-12-18: 40 meq via ORAL
  Filled 2023-12-18: qty 2

## 2023-12-18 MED ORDER — FUROSEMIDE 40 MG PO TABS: 40.0000 mg | ORAL_TABLET | Freq: Every morning | ORAL | Status: AC

## 2023-12-18 NOTE — TOC Transition Note (Signed)
 Transition of Care [facility]) - Discharge Note   Patient Details  Name: Steve Jensen MRN: [id number] Date of Birth: 05-12-1941  [facility]) CM/SW Contact:  Inocente GORMAN Kindle, LCSW Phone Number: 12/18/2023, 11:02 AM   Clinical Narrative:    Patient will DC to: [facility] SNF Anticipated DC date: 12/18/23 Family notified: Daughter, Proofreader by: R[place]   Per MD patient ready for DC to [facility]. RN to call report prior to discharge [phone] room 52). RN, patient, patient's family, and facility notified of DC. Discharge Summary and FL2 sent to facility. DC packet on chart including signed DNR. Ambulance transport requested for patient.   CSW will sign off for now as social work intervention is no longer needed. Please consult us  again if new needs arise.     Final next level of care: Skilled Nursing Facility Barriers to Discharge: Barriers Resolved   Patient Goals and CMS Choice Patient states their goals for this hospitalization and ongoing recovery are:: Return to [facility]          Discharge Placement   Existing PASRR number confirmed : 12/18/23          Patient chooses bed at: [facility] Patient to be transferred to facility by: [facility] Name of family member notified: Daughter Patient and family notified of of transfer: 12/18/23  Discharge Plan and Services Additional resources added to the After Visit Summary for   In-house Referral: Clinical Social Work   Post Acute Care Choice: Skilled Nursing Facility                               Social Drivers of Health (SDOH) Interventions SDOH Screenings   Food Insecurity: No Food Insecurity (12/10/2023)  Housing: Low Risk  (12/10/2023)  Transportation Needs: No Transportation Needs (12/10/2023)  Utilities: Not At Risk (12/10/2023)  Social Connections: Socially Isolated (12/10/2023)  Tobacco Use: Medium Risk (12/09/2023)     Readmission Risk Interventions    12/18/2023    11:00 AM 12/26/2021   12:51 PM 12/25/2021    3:31 PM  Readmission Risk Prevention Plan  Transportation Screening Complete  Complete  PCP or Specialist Appt within 5-7 Days  Complete   Home Care Screening   Complete  Medication Review (RN CM)   Complete  Medication Review (RN Care Manager) Complete    PCP or Specialist appointment within 3-5 days of discharge Complete    HRI or Home Care Consult Complete    SW Recovery Care/Counseling Consult Complete    Palliative Care Screening Not Applicable    Skilled Nursing Facility Complete

## 2023-12-18 NOTE — Progress Notes (Signed)
 Report called to Kaylee at [facility].

## 2023-12-18 NOTE — Discharge Summary (Signed)
 PATIENT DETAILS Name: Steve Jensen Age: 83 y.o. Sex: male Date of Birth: February 05, 1941 MRN: [id number]. Admitting Physician: Editha Ram, MD ERE:[email or web address], Provider Not In  Admit Date: 12/09/2023 Discharge date: 12/18/2023  Recommendations for Outpatient Follow-up:  Follow up with PCP in 1-2 weeks Please repeat chemistry panel within 4-5 days-and then at least once a week till creatinine normalizes.  Admitted From:  SNF   Disposition: Skilled nursing facility   Discharge Condition: good  CODE STATUS:   Code Status: Limited: Do not attempt resuscitation (DNR) -DNR-LIMITED -Do Not Intubate/DNI    Diet recommendation:  Diet Order             Diet - low sodium heart healthy           Diet Carb Modified           Diet heart healthy/carb modified Room service appropriate? No; Fluid consistency: Thin  Diet effective now                    Brief Summary: Patient is a 83 y.o.  male with history of A-fib, HTN, chronic HFrEF, PAF, CKD stage IIIa, hepatocellular CA, prostate CA, OSA on CPAP-presented to the hospital with confusion-found to have AKI.   Significant events: 7/17>> admit to [facility].   Significant studies: 7/16>>CT Head:no acute intracranial pathology 7/16>>CT Renal Stone study: No hydronephrosis. 7/16>> CXR: No PNA   Significant microbiology data: 7/16>> urine culture: Multiple species. 7/16>> blood culture: No growth 7/17>> GI pathogen panel: Negative 7/17>> stool C. difficile PCR: Negative   Procedures: None   Consults: Renal  Brief Hospital Course: AKI on CKD stage IIIa AKI likely hemodynamically mediated in the setting of diarrhea/diuretic/metformin  use. Thankfully with supportive care-renal function plateaued and now has started to improve. He did develop little bit of volume overload for which she was given Lasix  periodically. Volume status stable on day of discharge Creatinine continues to downtrend with just supportive care-no longer  on IV fluids. Stable for continued outpatient monitoring-repeat labs within 1 week at SNF.  Continue to avoid nephrotoxic agents.     Non-anion gap metabolic acidosis Due to combination of diarrhea and AKI Improved-briefly required bicarb infusion   Hypokalemia Likely secondary to furosemide  Repleted   Hypomagnesemia Repleted.   Acute metabolic encephalopathy Secondary to AKI Improved-answering questions appropriately.   Suspect back to baseline.  Acute on chronic HFpEF Did have significant edema/volume overload but that has now resolved   Chronic atrial fibrillation Rate controlled Telemetry monitoring Continue metoprolol  Continue Eliquis .   ?  UTI Has no symptoms-2 samples of UA with discordant results Since not symptomatic-has recent diagnosis of C. difficile-antibiotics stopped on 7/18-and being monitored off antibiotics.     Chronic diarrhea Ongoing for the past 3-4 months Repeat stool studies negative for C. difficile/infection Has essentially resolved. Continue with as needed Imodium . Due to improvement-suspect we can hold off on GI evaluation at this point.    Recent history of C. difficile diarrhea Completed course of oral vancomycin -repeat stool studies negative. Minimize use of antibiotics as much as possible   HLD Zetia    DM-2 (A1c 7.5 on 4/11) CBG stable adjust SSI Holding long-acting insulin /metformin  on discharge-this can be resumed by outpatient MD if his sugars start going up.  Peripheral neuropathy Lyrica .   History of hepatocellular carcinoma History of prostate cancer-s/p prostatectomy Outpatient oncology/urology follow-up.   Recent history of L4 vertebral body compression fracture Repeat CT this admit-with lytic component-with concern for metastatic disease. Continue TSLO  brace-outpatient neurosurgical follow-up   Mood disorder Relatively stable Continue Prozac /trazodone .   OSA CPAP nightly   Chronic debility/deconditioning Per  PT note 7/22-at baseline-long-term care resident.   Class 3 Obesity: Estimated body mass index is 40.63 kg/m as calculated from the following:   Height as of 11/10/23: 5' 9 (1.753 m).   Weight as of this encounter: 124.8 kg.    Discharge Diagnoses:  Principal Problem:   AKI (acute kidney injury) (HCC) Active Problems:   Acute metabolic acidosis   C. difficile diarrhea   Acute metabolic encephalopathy   Type 2 diabetes mellitus (HCC)   CHF exacerbation (HCC)   UTI (urinary tract infection)   Discharge Instructions:  Activity:  As tolerated with Full fall precautions use walker/cane & assistance as needed   Discharge Instructions     (HEART FAILURE PATIENTS) Call MD:  Anytime you have any of the following symptoms: 1) 3 pound weight gain in 24 hours or 5 pounds in 1 week 2) shortness of breath, with or without a dry hacking cough 3) swelling in the hands, feet or stomach 4) if you have to sleep on extra pillows at night in order to breathe.   Complete by: As directed    Call MD for:  difficulty breathing, headache or visual disturbances   Complete by: As directed    Diet - low sodium heart healthy   Complete by: As directed    Diet Carb Modified   Complete by: As directed    Discharge instructions   Complete by: As directed    Follow with Primary MD in 1-2 weeks  Please get a complete blood count and chemistry panel checked by your Primary MD at your next visit, and again as instructed by your Primary MD.  Get Medicines reviewed and adjusted: Please take all your medications with you for your next visit with your Primary MD  Laboratory/radiological data: Please request your Primary MD to go over all hospital tests and procedure/radiological results at the follow up, please ask your Primary MD to get all Hospital records sent to his/her office.  In some cases, they will be blood work, cultures and biopsy results pending at the time of your discharge. Please request that  your primary care M.D. follows up on these results.  Also Note the following: If you experience worsening of your admission symptoms, develop shortness of breath, life threatening emergency, suicidal or homicidal thoughts you must seek medical attention immediately by calling 911 or calling your MD immediately  if symptoms less severe.  You must read complete instructions/literature along with all the possible adverse reactions/side effects for all the Medicines you take and that have been prescribed to you. Take any new Medicines after you have completely understood and accpet all the possible adverse reactions/side effects.   Do not drive when taking Pain medications or sleeping medications (Benzodaizepines)  Do not take more than prescribed Pain, Sleep and Anxiety Medications. It is not advisable to combine anxiety,sleep and pain medications without talking with your primary care practitioner  Special Instructions: If you have smoked or chewed Tobacco  in the last 2 yrs please stop smoking, stop any regular Alcohol  and or any Recreational drug use.  Wear Seat belts while driving.  Please note: You were cared for by a hospitalist during your hospital stay. Once you are discharged, your primary care physician will handle any further medical issues. Please note that NO REFILLS for any discharge medications will be authorized once you are  discharged, as it is imperative that you return to your primary care physician (or establish a relationship with a primary care physician if you do not have one) for your post hospital discharge needs so that they can reassess your need for medications and monitor your lab values.   Check your CBGs before meals and at bedtime   Increase activity slowly   Complete by: As directed       Allergies as of 12/18/2023       Reactions   Lipitor [atorvastatin] Other (See Comments)   Myalgias   Zithromax [azithromycin] Other (See Comments)   GI intolerance    Zocor [simvastatin] Other (See Comments)   Myalgias    Dificid  [fidaxomicin ] Rash   Norvasc  [amlodipine ] Swelling        Medication List     STOP taking these medications    metFORMIN  1000 MG tablet Commonly known as: GLUCOPHAGE    Soliqua 100-33 UNT-MCG/ML Sopn Generic drug: Insulin  Glargine-Lixisenatide       TAKE these medications    allopurinol  100 MG tablet Commonly known as: ZYLOPRIM  Take 100 mg by mouth in the morning.   apixaban  2.5 MG Tabs tablet Commonly known as: Eliquis  Take 1 tablet (2.5 mg total) by mouth 2 (two) times daily. What changed:  medication strength how much to take   ezetimibe  10 MG tablet Commonly known as: ZETIA  Take 10 mg by mouth in the morning.   ferrous sulfate 325 (65 FE) MG tablet Take 325 mg by mouth daily with breakfast.   Fish Oil 1000 MG Caps Take 1,000 mg by mouth 2 (two) times daily.   FLUoxetine  10 MG capsule Commonly known as: PROZAC  Take 10 mg by mouth every evening.   furosemide  40 MG tablet Commonly known as: LASIX  Take 1 tablet (40 mg total) by mouth in the morning. Start taking on: December 20, 2023 What changed: These instructions start on December 20, 2023. If you are unsure what to do until then, ask your doctor or other care provider.   guaiFENesin  600 MG 12 hr tablet Commonly known as: MUCINEX  Take 600 mg by mouth 2 (two) times daily as needed for cough or to loosen phlegm.   insulin  aspart 100 UNIT/ML injection Commonly known as: novoLOG  Inject 0-10 Units into the skin 3 (three) times daily before meals. Sliding scale: if sugar is 150-200 = 2 units, if 201-250 = 4 units, if 251-300 = 6 units, if 301-350 = 8 units, if 351-400 = 10 units, give blood sugar is greater than 400 then call the MD.   ipratropium-albuterol  0.5-2.5 (3) MG/3ML Soln Commonly known as: DUONEB Take 3 mLs by nebulization 3 (three) times daily.   ipratropium-albuterol  0.5-2.5 (3) MG/3ML Soln Commonly known as: DUONEB Take 3 mLs by  nebulization every 4 (four) hours as needed.   loperamide  2 MG capsule Commonly known as: IMODIUM  Take 1 capsule (2 mg total) by mouth as needed for diarrhea or loose stools.   Magnesium  Glycinate 100 MG Caps Take 1 capsule by mouth in the morning and at bedtime.   metoprolol  succinate 100 MG 24 hr tablet Commonly known as: TOPROL -XL Take 1 tablet (100 mg total) by mouth 2 (two) times daily. Take with or immediately following a meal.   Multivitamin Men 50+ Tabs Take 1 tablet by mouth every evening.   omeprazole  20 MG capsule Commonly known as: PRILOSEC Take 1 capsule (20 mg total) by mouth daily. What changed: when to take this   potassium chloride  SA  20 MEQ tablet Commonly known as: KLOR-CON  M Please take 40 mEq daily for 5 days and then 20 mEq daily What changed:  how much to take how to take this when to take this additional instructions   pregabalin  50 MG capsule Commonly known as: LYRICA  Take 1 capsule (50 mg total) by mouth 2 (two) times daily.   tamsulosin  0.4 MG Caps capsule Commonly known as: FLOMAX  Take 1 capsule (0.4 mg total) by mouth daily.   traZODone  50 MG tablet Commonly known as: DESYREL  Take 25 mg by mouth at bedtime.   Vitamin D 50 MCG (2000 UT) Caps Take 2,000 Units by mouth in the morning.   zinc  oxide 20 % ointment Apply 1 Application topically See admin instructions. Every shift        Follow-up Information     Primary care practitioner. Schedule an appointment as soon as possible for a visit in 1 week(s).                 Allergies  Allergen Reactions   Lipitor [Atorvastatin] Other (See Comments)    Myalgias   Zithromax [Azithromycin] Other (See Comments)    GI intolerance   Zocor [Simvastatin] Other (See Comments)    Myalgias    Dificid  [Fidaxomicin ] Rash   Norvasc  [Amlodipine ] Swelling     Other Procedures/Studies: ECHOCARDIOGRAM COMPLETE Result Date: 12/11/2023    ECHOCARDIOGRAM REPORT   Patient Name:   ANDRY BOGDEN Date of Exam: 12/11/2023 Medical Rec #:  [id number]      Height:       69.0 in Accession #:    [id number]     Weight:       272.0 lb Date of Birth:  01-25-1941     BSA:          2.355 m Patient Age:    82 years       BP:           122/90 mmHg Patient Gender: M              HR:           92 bpm. Exam Location:  Inpatient Procedure: 2D Echo, Cardiac Doppler and Color Doppler (Both Spectral and Color            Flow Doppler were utilized during procedure). Indications:    Cardiomyopathy  History:        Patient has prior history of Echocardiogram examinations, most                 recent 09/04/2023. Risk Factors:Diabetes.  Sonographer:    Benard Stallion Referring Phys: [id number] BURGESS BROCKS AMIN IMPRESSIONS  1. Left ventricular ejection fraction, by estimation, is 65 to 70%. The left ventricle has normal function. The left ventricle has no regional wall motion abnormalities. Left ventricular diastolic parameters are indeterminate.  2. Right ventricular systolic function is normal. The right ventricular size is normal.  3. Left atrial size was mildly dilated.  4. Right atrial size was moderately dilated.  5. Mild mitral valve regurgitation.  6. The aortic valve is tricuspid. Aortic valve regurgitation is trivial. Aortic valve sclerosis/calcification is present, without any evidence of aortic stenosis. Comparison(s): The left ventricular function is unchanged. FINDINGS  Left Ventricle: Left ventricular ejection fraction, by estimation, is 65 to 70%. The left ventricle has normal function. The left ventricle has no regional wall motion abnormalities. The left ventricular internal cavity size was normal in size. There is  no left  ventricular hypertrophy. Left ventricular diastolic parameters are indeterminate. Right Ventricle: The right ventricular size is normal. Right vetricular wall thickness was not assessed. Right ventricular systolic function is normal. Left Atrium: Left atrial size was mildly dilated. Right  Atrium: Right atrial size was moderately dilated. Pericardium: There is no evidence of pericardial effusion. Mitral Valve: There is mild thickening of the mitral valve leaflet(s). Mild mitral annular calcification. Mild mitral valve regurgitation. Tricuspid Valve: The tricuspid valve is normal in structure. Tricuspid valve regurgitation is mild. Aortic Valve: The aortic valve is tricuspid. Aortic valve regurgitation is trivial. Aortic valve sclerosis/calcification is present, without any evidence of aortic stenosis. Aortic valve mean gradient measures 3.0 mmHg. Aortic valve peak gradient measures 5.2 mmHg. Aortic valve area, by VTI measures 3.43 cm. Pulmonic Valve: The pulmonic valve was not well visualized. Pulmonic valve regurgitation is not visualized. Aorta: The aortic root is normal in size and structure. IAS/Shunts: No atrial level shunt detected by color flow Doppler.  LEFT VENTRICLE PLAX 2D LVIDd:         4.90 cm   Diastology LVIDs:         3.55 cm   LV e' medial:    13.80 cm/s LV PW:         1.00 cm   LV E/e' medial:  8.3 LV IVS:        1.00 cm   LV e' lateral:   12.50 cm/s LVOT diam:     2.30 cm   LV E/e' lateral: 9.1 LV SV:         71 LV SV Index:   30 LVOT Area:     4.15 cm  RIGHT VENTRICLE RV S prime:     9.46 cm/s TAPSE (M-mode): 1.6 cm LEFT ATRIUM             Index        RIGHT ATRIUM           Index LA diam:        4.80 cm 2.04 cm/m   RA Area:     27.40 cm LA Vol (A2C):   82.0 ml 34.83 ml/m  RA Volume:   95.50 ml  40.56 ml/m LA Vol (A4C):   88.9 ml 37.76 ml/m LA Biplane Vol: 91.7 ml 38.95 ml/m  AORTIC VALVE AV Area (Vmax):    3.58 cm AV Area (Vmean):   3.37 cm AV Area (VTI):     3.43 cm AV Vmax:           114.00 cm/s AV Vmean:          80.000 cm/s AV VTI:            0.207 m AV Peak Grad:      5.2 mmHg AV Mean Grad:      3.0 mmHg LVOT Vmax:         98.10 cm/s LVOT Vmean:        64.900 cm/s LVOT VTI:          0.171 m LVOT/AV VTI ratio: 0.83  AORTA Ao Root diam: 3.50 cm MITRAL VALVE                 TRICUSPID VALVE MV Area (PHT): 5.02 cm     TR Peak grad:   36.7 mmHg MV Decel Time: 151 msec     TR Vmax:        303.00 cm/s MR Peak grad: 61.2 mmHg MR Vmax:      391.00 cm/s  SHUNTS MV E velocity: 114.00 cm/s  Systemic VTI:  0.17 m MV A velocity: 63.60 cm/s   Systemic Diam: 2.30 cm MV E/A ratio:  1.79 Vina Gull MD Electronically signed by Vina Gull MD Signature Date/Time: 12/11/2023/2:45:34 PM    Final    DG Chest Port 1 View Result Date: 12/09/2023 CLINICAL DATA:  Altered mental status EXAM: PORTABLE CHEST 1 VIEW COMPARISON:  Chest x-ray 11/09/2023 FINDINGS: The heart is mildly enlarged. There is central pulmonary vascular congestion. There is atelectasis or scarring in the left mid lung. There is no pleural effusion or pneumothorax. No acute fractures are seen. IMPRESSION: Mild cardiomegaly with central pulmonary vascular congestion. Electronically Signed   By: Greig Pique M.D.   On: 12/09/2023 18:00   CT Renal Stone Study Result Date: 12/09/2023 CLINICAL DATA:  Abdominal and flank pain, stone suspected history of prostate cancer, history of hepatocellular carcinoma * Tracking Code: BO * EXAM: CT ABDOMEN AND PELVIS WITHOUT CONTRAST TECHNIQUE: Multidetector CT imaging of the abdomen and pelvis was performed following the standard protocol without IV contrast. RADIATION DOSE REDUCTION: This exam was performed according to the departmental dose-optimization program which includes automated exposure control, adjustment of the mA and/or kV according to patient size and/or use of iterative reconstruction technique. COMPARISON:  11/11/2023 FINDINGS: Lower chest: Cardiomegaly. Unchanged atelectasis or consolidation of the right lung base. Unchanged trace right pleural effusion. Small hiatal hernia. Hepatobiliary: Coarse cirrhotic morphology of the liver. Patient's known hepatocellular carcinoma not well appreciated by noncontrast CT. Tiny gallstones. Gallbladder wall thickening, or biliary dilatation.  Pancreas: Unremarkable. No pancreatic ductal dilatation or surrounding inflammatory changes. Spleen: Normal in size without significant abnormality. Adrenals/Urinary Tract: Unchanged benign right adrenal adenoma, requiring no further follow-up or characterization. Kidneys are normal, without renal calculi, solid lesion, or hydronephrosis. Bladder is unremarkable. Stomach/Bowel: Stomach is within normal limits. Appendix appears normal. Colon is fluid-filled to the rectum. Vascular/Lymphatic: Aortic atherosclerosis. No enlarged abdominal or pelvic lymph nodes. Reproductive: Status post prostatectomy. Other: No abdominal wall hernia or abnormality. No ascites. Musculoskeletal: Redemonstrated superior endplate fracture deformity of the L4 vertebral body with substantial lytic component (series 7, image 155). Unchanged sclerotic superior endplate deformity of L1. Extensive bridging osteophytosis of the thoracic and upper lumbar spine in keeping with DISH. IMPRESSION: 1. No noncontrast CT evidence of urinary tract calculus or hydronephrosis. 2. Redemonstrated superior endplate fracture deformity of the L4 vertebral body with substantial lytic component. This may reflect benign fracture resorption but is highly suspicious for underlying metastasis in this patient with known malignancy. 3. Colon is fluid-filled to the rectum, consistent with diarrheal illness. 4. Coarse cirrhotic morphology of the liver. Patient's known hepatocellular carcinoma not well appreciated by noncontrast CT. 5. Cholelithiasis. 6. Status post prostatectomy. Aortic Atherosclerosis (ICD10-I70.0). Electronically Signed   By: Marolyn JONETTA Jaksch M.D.   On: 12/09/2023 17:07   CT HEAD WO CONTRAST Result Date: 12/09/2023 CLINICAL DATA:  Altered mental status EXAM: CT HEAD WITHOUT CONTRAST TECHNIQUE: Contiguous axial images were obtained from the base of the skull through the vertex without intravenous contrast. RADIATION DOSE REDUCTION: This exam was  performed according to the departmental dose-optimization program which includes automated exposure control, adjustment of the mA and/or kV according to patient size and/or use of iterative reconstruction technique. COMPARISON:  10/24/2023 FINDINGS: Brain: No evidence of acute infarction, hemorrhage, hydrocephalus, extra-axial collection or mass lesion/mass effect. Periventricular white matter hypodensity. Vascular: No hyperdense vessel or unexpected calcification. Skull: Normal. Negative for fracture or focal lesion. Sinuses/Orbits: No acute finding.  Other: None. IMPRESSION: No acute intracranial pathology.  Small-vessel white matter disease. Electronically Signed   By: Marolyn JONETTA Jaksch M.D.   On: 12/09/2023 17:00     TODAY-DAY OF DISCHARGE:  Subjective:   Steve Jensen today has no headache,no chest abdominal pain,no new weakness tingling or numbness, feels much better wants to go home today.   Objective:   Blood pressure 98/66, pulse 74, temperature (!) 97.4 F (36.3 C), temperature source Oral, resp. rate 14, weight 124.8 kg, SpO2 95%.  Intake/Output Summary (Last 24 hours) at 12/18/2023 0902 Last data filed at 12/17/2023 1956 Gross per 24 hour  Intake 120 ml  Output 900 ml  Net -780 ml   Filed Weights   12/12/23 1518  Weight: 124.8 kg    Exam: Awake Alert, Oriented *3, No new F.N deficits, Normal affect [place].AT,PERRAL Supple Neck,No JVD, No cervical lymphadenopathy appriciated.  Symmetrical Chest wall movement, Good air movement bilaterally, CTAB RRR,No Gallops,Rubs or new Murmurs, No Parasternal Heave +ve B.Sounds, Abd Soft, Non tender, No organomegaly appriciated, No rebound -guarding or rigidity. No Cyanosis, Clubbing or edema, No new Rash or bruise   PERTINENT RADIOLOGIC STUDIES: No results found.   PERTINENT LAB RESULTS: CBC: Recent Labs    12/16/23 0437  WBC 10.1  HGB 13.3  HCT 40.0  PLT 315   CMET CMP     Component Value Date/Time   NA 138 12/18/2023 0558    NA 144 02/18/2021 1555   K 3.3 (L) 12/18/2023 0558   CL 99 12/18/2023 0558   CO2 23 12/18/2023 0558   GLUCOSE 129 (H) 12/18/2023 0558   BUN 38 (H) 12/18/2023 0558   BUN 19 02/18/2021 1555   CREATININE 3.53 (H) 12/18/2023 0558   CREATININE 1.21 10/09/2021 1732   CALCIUM  9.2 12/18/2023 0558   PROT 5.5 (L) 12/09/2023 1601   PROT 6.7 07/27/2020 1532   ALBUMIN  2.9 (L) 12/17/2023 0433   ALBUMIN  4.3 07/27/2020 1532   AST 20 12/09/2023 1601   AST 41 07/02/2018 1448   ALT 10 12/09/2023 1601   ALT 41 07/02/2018 1448   ALKPHOS 95 12/09/2023 1601   BILITOT 0.6 12/09/2023 1601   BILITOT 0.3 07/27/2020 1532   BILITOT 0.7 07/02/2018 1448   GFR 74.61 03/09/2014 1520   EGFR 43.0 03/25/2023 1539   EGFR 61 10/09/2021 1732   EGFR 61 02/18/2021 1555   GFRNONAA 17 (L) 12/18/2023 0558   GFRNONAA 68 08/14/2020 0000    GFR Estimated Creatinine Clearance: 21.4 mL/min (A) (by C-G formula based on SCr of 3.53 mg/dL (H)). No results for input(s): LIPASE, AMYLASE in the last 72 hours. No results for input(s): CKTOTAL, CKMB, CKMBINDEX, TROPONINI in the last 72 hours. Invalid input(s): POCBNP No results for input(s): DDIMER in the last 72 hours. No results for input(s): HGBA1C in the last 72 hours. No results for input(s): CHOL, HDL, LDLCALC, TRIG, CHOLHDL, LDLDIRECT in the last 72 hours. No results for input(s): TSH, T4TOTAL, T3FREE, THYROIDAB in the last 72 hours.  Invalid input(s): FREET3 No results for input(s): VITAMINB12, FOLATE, FERRITIN, TIBC, IRON, RETICCTPCT in the last 72 hours. Coags: No results for input(s): INR in the last 72 hours.  Invalid input(s): PT Microbiology: Recent Results (from the past 240 hours)  Blood culture (routine x 2)     Status: None   Collection Time: 12/09/23  4:20 PM   Specimen: BLOOD LEFT FOREARM  Result Value Ref Range Status   Specimen Description BLOOD LEFT FOREARM  Final   Special  Requests   Final     BOTTLES DRAWN AEROBIC AND ANAEROBIC Blood Culture adequate volume   Culture   Final    NO GROWTH 5 DAYS Performed at [facility], [address], [place], [place] [address]    Report Status 12/14/2023 FINAL  Final  Blood culture (routine x 2)     Status: None   Collection Time: 12/09/23  4:44 PM   Specimen: BLOOD RIGHT ARM  Result Value Ref Range Status   Specimen Description BLOOD RIGHT ARM  Final   Special Requests   Final    BOTTLES DRAWN AEROBIC AND ANAEROBIC Blood Culture adequate volume   Culture   Final    NO GROWTH 5 DAYS Performed at [facility], [address], [place], [place] [address]    Report Status 12/14/2023 FINAL  Final  Urine Culture     Status: Abnormal   Collection Time: 12/09/23 10:30 PM   Specimen: Urine, Random  Result Value Ref Range Status   Specimen Description URINE, RANDOM  Final   Special Requests   Final    NONE Reflexed from [id number] Performed at [facility], [address], [place], [place] [address]    Culture MULTIPLE SPECIES PRESENT, SUGGEST RECOLLECTION (A)  Final   Report Status 12/10/2023 FINAL  Final  C Difficile Quick Screen w PCR reflex     Status: None   Collection Time: 12/10/23  9:50 AM   Specimen: STOOL  Result Value Ref Range Status   C Diff antigen NEGATIVE NEGATIVE Final   C Diff toxin NEGATIVE NEGATIVE Final   C Diff interpretation No C. difficile detected.  Final    Comment: Performed at [facility], [address], [place], [place] [address]  Gastrointestinal Panel by PCR , Stool     Status: None   Collection Time: 12/10/23  1:34 PM   Specimen: Stool  Result Value Ref Range Status   Campylobacter species NOT DETECTED NOT DETECTED Final   Plesimonas shigelloides NOT DETECTED NOT DETECTED Final   Salmonella species NOT DETECTED NOT DETECTED Final   Yersinia enterocolitica NOT DETECTED NOT DETECTED Final   Vibrio species NOT DETECTED NOT DETECTED Final   Vibrio cholerae NOT DETECTED NOT  DETECTED Final   Enteroaggregative E coli (EAEC) NOT DETECTED NOT DETECTED Final   Enteropathogenic E coli (EPEC) NOT DETECTED NOT DETECTED Final   Enterotoxigenic E coli (ETEC) NOT DETECTED NOT DETECTED Final   Shiga like toxin producing E coli (STEC) NOT DETECTED NOT DETECTED Final   Shigella/Enteroinvasive E coli (EIEC) NOT DETECTED NOT DETECTED Final   Cryptosporidium NOT DETECTED NOT DETECTED Final   Cyclospora cayetanensis NOT DETECTED NOT DETECTED Final   Entamoeba histolytica NOT DETECTED NOT DETECTED Final   Giardia lamblia NOT DETECTED NOT DETECTED Final   Adenovirus F40/41 NOT DETECTED NOT DETECTED Final   Astrovirus NOT DETECTED NOT DETECTED Final   Norovirus GI/GII NOT DETECTED NOT DETECTED Final   Rotavirus A NOT DETECTED NOT DETECTED Final   Sapovirus (I, II, IV, and V) NOT DETECTED NOT DETECTED Final    Comment: Performed at [facility], [address] [place], [place] [address]  MRSA Next Gen by PCR, Nasal     Status: None   Collection Time: 12/10/23  5:13 PM   Specimen: Nasal Mucosa; Nasal Swab  Result Value Ref Range Status   MRSA by PCR Next Gen NOT DETECTED NOT DETECTED Final    Comment: (NOTE) The  GeneXpert MRSA Assay (FDA approved for NASAL specimens only), is one component of a comprehensive MRSA colonization surveillance program. It is not intended to diagnose MRSA infection nor to guide or monitor treatment for MRSA infections. Test performance is not FDA approved in patients less than 23 years old. Performed at [facility], [address], [place], [place] [address]     FURTHER DISCHARGE INSTRUCTIONS:  Get Medicines reviewed and adjusted: Please take all your medications with you for your next visit with your Primary MD  Laboratory/radiological data: Please request your Primary MD to go over all hospital tests and procedure/radiological results at the follow up, please ask your Primary MD to get all Hospital records sent to his/her  office.  In some cases, they will be blood work, cultures and biopsy results pending at the time of your discharge. Please request that your primary care M.D. goes through all the records of your hospital data and follows up on these results.  Also Note the following: If you experience worsening of your admission symptoms, develop shortness of breath, life threatening emergency, suicidal or homicidal thoughts you must seek medical attention immediately by calling 911 or calling your MD immediately  if symptoms less severe.  You must read complete instructions/literature along with all the possible adverse reactions/side effects for all the Medicines you take and that have been prescribed to you. Take any new Medicines after you have completely understood and accpet all the possible adverse reactions/side effects.   Do not drive when taking Pain medications or sleeping medications (Benzodaizepines)  Do not take more than prescribed Pain, Sleep and Anxiety Medications. It is not advisable to combine anxiety,sleep and pain medications without talking with your primary care practitioner  Special Instructions: If you have smoked or chewed Tobacco  in the last 2 yrs please stop smoking, stop any regular Alcohol  and or any Recreational drug use.  Wear Seat belts while driving.  Please note: You were cared for by a hospitalist during your hospital stay. Once you are discharged, your primary care physician will handle any further medical issues. Please note that NO REFILLS for any discharge medications will be authorized once you are discharged, as it is imperative that you return to your primary care physician (or establish a relationship with a primary care physician if you do not have one) for your post hospital discharge needs so that they can reassess your need for medications and monitor your lab values.  Total Time spent coordinating discharge including counseling, education and face to face time  equals greater than 30 minutes.  Signed: Demiya Magno 12/18/2023 9:02 AM

## 2023-12-19 DIAGNOSIS — E039 Hypothyroidism, unspecified: Secondary | ICD-10-CM | POA: Diagnosis not present

## 2023-12-19 DIAGNOSIS — E872 Acidosis, unspecified: Secondary | ICD-10-CM | POA: Diagnosis not present

## 2023-12-19 DIAGNOSIS — E559 Vitamin D deficiency, unspecified: Secondary | ICD-10-CM | POA: Diagnosis not present

## 2023-12-19 DIAGNOSIS — G9341 Metabolic encephalopathy: Secondary | ICD-10-CM | POA: Diagnosis not present

## 2023-12-19 DIAGNOSIS — E119 Type 2 diabetes mellitus without complications: Secondary | ICD-10-CM | POA: Diagnosis not present

## 2023-12-22 DIAGNOSIS — L97521 Non-pressure chronic ulcer of other part of left foot limited to breakdown of skin: Secondary | ICD-10-CM | POA: Diagnosis not present

## 2023-12-22 DIAGNOSIS — I5030 Unspecified diastolic (congestive) heart failure: Secondary | ICD-10-CM | POA: Diagnosis not present

## 2023-12-23 DIAGNOSIS — E119 Type 2 diabetes mellitus without complications: Secondary | ICD-10-CM | POA: Diagnosis not present

## 2023-12-23 DIAGNOSIS — D72829 Elevated white blood cell count, unspecified: Secondary | ICD-10-CM | POA: Diagnosis not present

## 2023-12-23 DIAGNOSIS — K219 Gastro-esophageal reflux disease without esophagitis: Secondary | ICD-10-CM | POA: Diagnosis not present

## 2023-12-23 DIAGNOSIS — I4891 Unspecified atrial fibrillation: Secondary | ICD-10-CM | POA: Diagnosis not present

## 2023-12-23 DIAGNOSIS — G4733 Obstructive sleep apnea (adult) (pediatric): Secondary | ICD-10-CM | POA: Diagnosis not present

## 2023-12-23 DIAGNOSIS — D649 Anemia, unspecified: Secondary | ICD-10-CM | POA: Diagnosis not present

## 2023-12-23 DIAGNOSIS — E785 Hyperlipidemia, unspecified: Secondary | ICD-10-CM | POA: Diagnosis not present

## 2023-12-23 DIAGNOSIS — N189 Chronic kidney disease, unspecified: Secondary | ICD-10-CM | POA: Diagnosis not present

## 2023-12-23 DIAGNOSIS — I5030 Unspecified diastolic (congestive) heart failure: Secondary | ICD-10-CM | POA: Diagnosis not present

## 2023-12-23 DIAGNOSIS — G4700 Insomnia, unspecified: Secondary | ICD-10-CM | POA: Diagnosis not present

## 2023-12-23 DIAGNOSIS — N179 Acute kidney failure, unspecified: Secondary | ICD-10-CM | POA: Diagnosis not present

## 2023-12-23 DIAGNOSIS — R5381 Other malaise: Secondary | ICD-10-CM | POA: Diagnosis not present

## 2023-12-24 DIAGNOSIS — I5030 Unspecified diastolic (congestive) heart failure: Secondary | ICD-10-CM | POA: Diagnosis not present

## 2023-12-24 DIAGNOSIS — R5381 Other malaise: Secondary | ICD-10-CM | POA: Diagnosis not present

## 2023-12-24 DIAGNOSIS — K219 Gastro-esophageal reflux disease without esophagitis: Secondary | ICD-10-CM | POA: Diagnosis not present

## 2023-12-24 DIAGNOSIS — E119 Type 2 diabetes mellitus without complications: Secondary | ICD-10-CM | POA: Diagnosis not present

## 2023-12-24 DIAGNOSIS — C220 Liver cell carcinoma: Secondary | ICD-10-CM | POA: Diagnosis not present

## 2023-12-24 DIAGNOSIS — N189 Chronic kidney disease, unspecified: Secondary | ICD-10-CM | POA: Diagnosis not present

## 2023-12-24 DIAGNOSIS — D72829 Elevated white blood cell count, unspecified: Secondary | ICD-10-CM | POA: Diagnosis not present

## 2023-12-24 DIAGNOSIS — E785 Hyperlipidemia, unspecified: Secondary | ICD-10-CM | POA: Diagnosis not present

## 2023-12-24 DIAGNOSIS — M109 Gout, unspecified: Secondary | ICD-10-CM | POA: Diagnosis not present

## 2023-12-24 DIAGNOSIS — G4700 Insomnia, unspecified: Secondary | ICD-10-CM | POA: Diagnosis not present

## 2023-12-24 DIAGNOSIS — G4733 Obstructive sleep apnea (adult) (pediatric): Secondary | ICD-10-CM | POA: Diagnosis not present

## 2023-12-24 DIAGNOSIS — I4891 Unspecified atrial fibrillation: Secondary | ICD-10-CM | POA: Diagnosis not present

## 2023-12-24 DIAGNOSIS — I10 Essential (primary) hypertension: Secondary | ICD-10-CM | POA: Diagnosis not present

## 2023-12-25 DIAGNOSIS — N390 Urinary tract infection, site not specified: Secondary | ICD-10-CM | POA: Diagnosis not present

## 2023-12-25 DIAGNOSIS — R197 Diarrhea, unspecified: Secondary | ICD-10-CM | POA: Diagnosis not present

## 2023-12-25 DIAGNOSIS — D72829 Elevated white blood cell count, unspecified: Secondary | ICD-10-CM | POA: Diagnosis not present

## 2023-12-27 DIAGNOSIS — N390 Urinary tract infection, site not specified: Secondary | ICD-10-CM | POA: Diagnosis not present

## 2023-12-28 DIAGNOSIS — N390 Urinary tract infection, site not specified: Secondary | ICD-10-CM | POA: Diagnosis not present

## 2023-12-28 DIAGNOSIS — N179 Acute kidney failure, unspecified: Secondary | ICD-10-CM | POA: Diagnosis not present

## 2023-12-28 DIAGNOSIS — I10 Essential (primary) hypertension: Secondary | ICD-10-CM | POA: Diagnosis not present

## 2023-12-28 DIAGNOSIS — E876 Hypokalemia: Secondary | ICD-10-CM | POA: Diagnosis not present

## 2023-12-28 DIAGNOSIS — Z79899 Other long term (current) drug therapy: Secondary | ICD-10-CM | POA: Diagnosis not present

## 2024-01-04 DIAGNOSIS — I10 Essential (primary) hypertension: Secondary | ICD-10-CM | POA: Diagnosis not present

## 2024-01-18 DIAGNOSIS — I5030 Unspecified diastolic (congestive) heart failure: Secondary | ICD-10-CM | POA: Diagnosis not present

## 2024-01-18 DIAGNOSIS — R5381 Other malaise: Secondary | ICD-10-CM | POA: Diagnosis not present

## 2024-01-18 DIAGNOSIS — I4891 Unspecified atrial fibrillation: Secondary | ICD-10-CM | POA: Diagnosis not present

## 2024-01-20 DIAGNOSIS — I10 Essential (primary) hypertension: Secondary | ICD-10-CM | POA: Diagnosis not present

## 2024-01-20 DIAGNOSIS — E119 Type 2 diabetes mellitus without complications: Secondary | ICD-10-CM | POA: Diagnosis not present

## 2024-01-21 DIAGNOSIS — I4891 Unspecified atrial fibrillation: Secondary | ICD-10-CM | POA: Diagnosis not present

## 2024-01-21 DIAGNOSIS — C220 Liver cell carcinoma: Secondary | ICD-10-CM | POA: Diagnosis not present

## 2024-01-21 DIAGNOSIS — I5030 Unspecified diastolic (congestive) heart failure: Secondary | ICD-10-CM | POA: Diagnosis not present

## 2024-05-26 DEATH — deceased
